# Patient Record
Sex: Female | Born: 1955
Health system: Southern US, Community
[De-identification: ages and names within clinical notes are randomized; demographics above are authoritative.]

## PROBLEM LIST (undated history)

## (undated) DIAGNOSIS — H9190 Unspecified hearing loss, unspecified ear: Secondary | ICD-10-CM

## (undated) DIAGNOSIS — K219 Gastro-esophageal reflux disease without esophagitis: Secondary | ICD-10-CM

## (undated) DIAGNOSIS — K760 Fatty (change of) liver, not elsewhere classified: Secondary | ICD-10-CM

## (undated) DIAGNOSIS — C787 Secondary malignant neoplasm of liver and intrahepatic bile duct: Secondary | ICD-10-CM

## (undated) DIAGNOSIS — N390 Urinary tract infection, site not specified: Secondary | ICD-10-CM

## (undated) DIAGNOSIS — C259 Malignant neoplasm of pancreas, unspecified: Secondary | ICD-10-CM

## (undated) DIAGNOSIS — T7840XA Allergy, unspecified, initial encounter: Secondary | ICD-10-CM

## (undated) DIAGNOSIS — F32A Depression, unspecified: Secondary | ICD-10-CM

## (undated) DIAGNOSIS — F329 Major depressive disorder, single episode, unspecified: Secondary | ICD-10-CM

## (undated) DIAGNOSIS — E119 Type 2 diabetes mellitus without complications: Secondary | ICD-10-CM

## (undated) DIAGNOSIS — C801 Malignant (primary) neoplasm, unspecified: Secondary | ICD-10-CM

## (undated) DIAGNOSIS — G43909 Migraine, unspecified, not intractable, without status migrainosus: Secondary | ICD-10-CM

## (undated) DIAGNOSIS — J189 Pneumonia, unspecified organism: Secondary | ICD-10-CM

## (undated) DIAGNOSIS — I7300 Raynaud's syndrome without gangrene: Secondary | ICD-10-CM

## (undated) DIAGNOSIS — R748 Abnormal levels of other serum enzymes: Secondary | ICD-10-CM

## (undated) DIAGNOSIS — G4730 Sleep apnea, unspecified: Secondary | ICD-10-CM

## (undated) DIAGNOSIS — M7711 Lateral epicondylitis, right elbow: Secondary | ICD-10-CM

## (undated) DIAGNOSIS — E785 Hyperlipidemia, unspecified: Secondary | ICD-10-CM

## (undated) DIAGNOSIS — N189 Chronic kidney disease, unspecified: Secondary | ICD-10-CM

## (undated) DIAGNOSIS — F99 Mental disorder, not otherwise specified: Secondary | ICD-10-CM

## (undated) DIAGNOSIS — F419 Anxiety disorder, unspecified: Secondary | ICD-10-CM

## (undated) DIAGNOSIS — I10 Essential (primary) hypertension: Secondary | ICD-10-CM

## (undated) DIAGNOSIS — M1990 Unspecified osteoarthritis, unspecified site: Secondary | ICD-10-CM

## (undated) DIAGNOSIS — G5711 Meralgia paresthetica, right lower limb: Secondary | ICD-10-CM

## (undated) DIAGNOSIS — Z87440 Personal history of urinary (tract) infections: Secondary | ICD-10-CM

## (undated) DIAGNOSIS — Z8489 Family history of other specified conditions: Secondary | ICD-10-CM

## (undated) DIAGNOSIS — A419 Sepsis, unspecified organism: Secondary | ICD-10-CM

## (undated) DIAGNOSIS — K8020 Calculus of gallbladder without cholecystitis without obstruction: Secondary | ICD-10-CM

## (undated) DIAGNOSIS — G4733 Obstructive sleep apnea (adult) (pediatric): Secondary | ICD-10-CM

## (undated) DIAGNOSIS — E669 Obesity, unspecified: Secondary | ICD-10-CM

## (undated) HISTORY — DX: Unspecified osteoarthritis, unspecified site: M19.90

## (undated) HISTORY — DX: Fatty (change of) liver, not elsewhere classified: K76.0

## (undated) HISTORY — PX: COLONOSCOPY: SHX174

## (undated) HISTORY — DX: Essential (primary) hypertension: I10

## (undated) HISTORY — DX: Allergy, unspecified, initial encounter: T78.40XA

## (undated) HISTORY — DX: Obstructive sleep apnea (adult) (pediatric): G47.33

## (undated) HISTORY — DX: Meralgia paresthetica, right lower limb: G57.11

## (undated) HISTORY — DX: Depression, unspecified: F32.A

## (undated) HISTORY — DX: Lateral epicondylitis, right elbow: M77.11

## (undated) HISTORY — PX: OTHER SURGICAL HISTORY: SHX169

## (undated) HISTORY — DX: Hyperlipidemia, unspecified: E78.5

## (undated) HISTORY — DX: Migraine, unspecified, not intractable, without status migrainosus: G43.909

## (undated) HISTORY — PX: JOINT REPLACEMENT: SHX530

## (undated) HISTORY — DX: Major depressive disorder, single episode, unspecified: F32.9

## (undated) HISTORY — DX: Personal history of urinary (tract) infections: Z87.440

## (undated) HISTORY — PX: CARDIAC CATHETERIZATION: SHX172

## (undated) HISTORY — DX: Calculus of gallbladder without cholecystitis without obstruction: K80.20

## (undated) HISTORY — DX: Obesity, unspecified: E66.9

## (undated) HISTORY — DX: Sleep apnea, unspecified: G47.30

## (undated) HISTORY — DX: Urinary tract infection, site not specified: N39.0

---

## 1997-07-06 ENCOUNTER — Other Ambulatory Visit: Admission: RE | Admit: 1997-07-06 | Discharge: 1997-07-06 | Payer: Self-pay | Admitting: *Deleted

## 1997-07-17 ENCOUNTER — Ambulatory Visit (HOSPITAL_COMMUNITY): Admission: RE | Admit: 1997-07-17 | Discharge: 1997-07-17 | Payer: Self-pay | Admitting: *Deleted

## 1998-08-10 ENCOUNTER — Other Ambulatory Visit: Admission: RE | Admit: 1998-08-10 | Discharge: 1998-08-10 | Payer: Self-pay | Admitting: *Deleted

## 1998-08-18 ENCOUNTER — Encounter: Payer: Self-pay | Admitting: *Deleted

## 1998-08-18 ENCOUNTER — Ambulatory Visit (HOSPITAL_COMMUNITY): Admission: RE | Admit: 1998-08-18 | Discharge: 1998-08-18 | Payer: Self-pay | Admitting: *Deleted

## 1998-08-23 ENCOUNTER — Ambulatory Visit (HOSPITAL_COMMUNITY): Admission: RE | Admit: 1998-08-23 | Discharge: 1998-08-23 | Payer: Self-pay | Admitting: *Deleted

## 1998-08-23 ENCOUNTER — Encounter: Payer: Self-pay | Admitting: *Deleted

## 1999-07-15 ENCOUNTER — Other Ambulatory Visit: Admission: RE | Admit: 1999-07-15 | Discharge: 1999-07-15 | Payer: Self-pay | Admitting: *Deleted

## 1999-08-31 ENCOUNTER — Encounter: Payer: Self-pay | Admitting: *Deleted

## 1999-08-31 ENCOUNTER — Encounter: Admission: RE | Admit: 1999-08-31 | Discharge: 1999-08-31 | Payer: Self-pay | Admitting: *Deleted

## 2000-08-23 ENCOUNTER — Other Ambulatory Visit: Admission: RE | Admit: 2000-08-23 | Discharge: 2000-08-23 | Payer: Self-pay | Admitting: *Deleted

## 2000-08-24 ENCOUNTER — Encounter (INDEPENDENT_AMBULATORY_CARE_PROVIDER_SITE_OTHER): Payer: Self-pay

## 2000-08-24 ENCOUNTER — Other Ambulatory Visit: Admission: RE | Admit: 2000-08-24 | Discharge: 2000-08-24 | Payer: Self-pay | Admitting: *Deleted

## 2000-09-20 ENCOUNTER — Encounter: Admission: RE | Admit: 2000-09-20 | Discharge: 2000-09-20 | Payer: Self-pay | Admitting: *Deleted

## 2000-09-20 ENCOUNTER — Encounter: Payer: Self-pay | Admitting: *Deleted

## 2000-12-27 ENCOUNTER — Encounter (INDEPENDENT_AMBULATORY_CARE_PROVIDER_SITE_OTHER): Payer: Self-pay | Admitting: Specialist

## 2000-12-27 ENCOUNTER — Emergency Department (HOSPITAL_COMMUNITY): Admission: EM | Admit: 2000-12-27 | Discharge: 2000-12-27 | Payer: Self-pay | Admitting: Emergency Medicine

## 2000-12-27 ENCOUNTER — Ambulatory Visit (HOSPITAL_COMMUNITY): Admission: RE | Admit: 2000-12-27 | Discharge: 2000-12-27 | Payer: Self-pay | Admitting: General Surgery

## 2001-09-23 ENCOUNTER — Encounter: Admission: RE | Admit: 2001-09-23 | Discharge: 2001-09-23 | Payer: Self-pay | Admitting: *Deleted

## 2001-09-23 ENCOUNTER — Encounter: Payer: Self-pay | Admitting: *Deleted

## 2001-12-05 ENCOUNTER — Ambulatory Visit (HOSPITAL_COMMUNITY): Admission: RE | Admit: 2001-12-05 | Discharge: 2001-12-05 | Payer: Self-pay | Admitting: *Deleted

## 2001-12-11 ENCOUNTER — Encounter: Payer: Self-pay | Admitting: Internal Medicine

## 2001-12-11 ENCOUNTER — Encounter: Admission: RE | Admit: 2001-12-11 | Discharge: 2001-12-11 | Payer: Self-pay | Admitting: Internal Medicine

## 2002-01-31 ENCOUNTER — Encounter: Payer: Self-pay | Admitting: Obstetrics & Gynecology

## 2002-01-31 ENCOUNTER — Encounter: Admission: RE | Admit: 2002-01-31 | Discharge: 2002-01-31 | Payer: Self-pay | Admitting: Obstetrics & Gynecology

## 2002-03-27 HISTORY — PX: ABDOMINAL HYSTERECTOMY: SHX81

## 2002-04-15 ENCOUNTER — Inpatient Hospital Stay (HOSPITAL_COMMUNITY): Admission: RE | Admit: 2002-04-15 | Discharge: 2002-04-18 | Payer: Self-pay | Admitting: Obstetrics & Gynecology

## 2002-04-15 ENCOUNTER — Encounter (INDEPENDENT_AMBULATORY_CARE_PROVIDER_SITE_OTHER): Payer: Self-pay | Admitting: Specialist

## 2002-04-25 ENCOUNTER — Emergency Department (HOSPITAL_COMMUNITY): Admission: EM | Admit: 2002-04-25 | Discharge: 2002-04-25 | Payer: Self-pay | Admitting: Emergency Medicine

## 2002-09-25 HISTORY — PX: OTHER SURGICAL HISTORY: SHX169

## 2002-11-06 ENCOUNTER — Encounter: Admission: RE | Admit: 2002-11-06 | Discharge: 2002-11-06 | Payer: Self-pay | Admitting: Obstetrics & Gynecology

## 2002-11-06 ENCOUNTER — Encounter: Payer: Self-pay | Admitting: Obstetrics & Gynecology

## 2002-12-04 ENCOUNTER — Encounter: Payer: Self-pay | Admitting: Internal Medicine

## 2002-12-04 ENCOUNTER — Encounter: Admission: RE | Admit: 2002-12-04 | Discharge: 2002-12-04 | Payer: Self-pay | Admitting: Internal Medicine

## 2003-10-07 ENCOUNTER — Ambulatory Visit (HOSPITAL_BASED_OUTPATIENT_CLINIC_OR_DEPARTMENT_OTHER): Admission: RE | Admit: 2003-10-07 | Discharge: 2003-10-07 | Payer: Self-pay | Admitting: Internal Medicine

## 2003-11-10 ENCOUNTER — Encounter: Admission: RE | Admit: 2003-11-10 | Discharge: 2003-11-10 | Payer: Self-pay | Admitting: Obstetrics & Gynecology

## 2004-08-26 ENCOUNTER — Ambulatory Visit (HOSPITAL_COMMUNITY): Admission: RE | Admit: 2004-08-26 | Discharge: 2004-08-26 | Payer: Self-pay | Admitting: Internal Medicine

## 2004-12-06 ENCOUNTER — Encounter: Admission: RE | Admit: 2004-12-06 | Discharge: 2004-12-06 | Payer: Self-pay | Admitting: Obstetrics & Gynecology

## 2005-02-24 HISTORY — PX: KNEE ARTHROSCOPY: SUR90

## 2005-12-19 ENCOUNTER — Encounter: Admission: RE | Admit: 2005-12-19 | Discharge: 2005-12-19 | Payer: Self-pay | Admitting: Obstetrics & Gynecology

## 2006-01-03 ENCOUNTER — Encounter: Admission: RE | Admit: 2006-01-03 | Discharge: 2006-01-03 | Payer: Self-pay | Admitting: Internal Medicine

## 2006-05-10 ENCOUNTER — Encounter: Admission: RE | Admit: 2006-05-10 | Discharge: 2006-05-10 | Payer: Self-pay | Admitting: *Deleted

## 2006-07-19 ENCOUNTER — Encounter: Admission: RE | Admit: 2006-07-19 | Discharge: 2006-07-19 | Payer: Self-pay | Admitting: Internal Medicine

## 2006-12-24 ENCOUNTER — Encounter: Admission: RE | Admit: 2006-12-24 | Discharge: 2006-12-24 | Payer: Self-pay | Admitting: Obstetrics & Gynecology

## 2007-01-16 ENCOUNTER — Encounter: Admission: RE | Admit: 2007-01-16 | Discharge: 2007-01-16 | Payer: Self-pay | Admitting: Internal Medicine

## 2007-12-18 ENCOUNTER — Encounter: Admission: RE | Admit: 2007-12-18 | Discharge: 2007-12-18 | Payer: Self-pay | Admitting: Internal Medicine

## 2008-01-17 ENCOUNTER — Encounter: Admission: RE | Admit: 2008-01-17 | Discharge: 2008-01-17 | Payer: Self-pay | Admitting: Obstetrics & Gynecology

## 2008-05-13 ENCOUNTER — Other Ambulatory Visit: Admission: RE | Admit: 2008-05-13 | Discharge: 2008-05-13 | Payer: Self-pay | Admitting: *Deleted

## 2008-07-30 ENCOUNTER — Ambulatory Visit (HOSPITAL_COMMUNITY): Admission: RE | Admit: 2008-07-30 | Discharge: 2008-07-30 | Payer: Self-pay | Admitting: Interventional Radiology

## 2008-08-25 HISTORY — PX: BLADDER SUSPENSION: SHX72

## 2008-10-23 ENCOUNTER — Ambulatory Visit (HOSPITAL_BASED_OUTPATIENT_CLINIC_OR_DEPARTMENT_OTHER): Admission: RE | Admit: 2008-10-23 | Discharge: 2008-10-23 | Payer: Self-pay | Admitting: Urology

## 2009-01-18 ENCOUNTER — Encounter: Admission: RE | Admit: 2009-01-18 | Discharge: 2009-01-18 | Payer: Self-pay | Admitting: Obstetrics & Gynecology

## 2009-02-24 HISTORY — PX: PLANTAR FASCIA RELEASE: SHX2239

## 2009-06-07 ENCOUNTER — Encounter: Admission: RE | Admit: 2009-06-07 | Discharge: 2009-06-07 | Payer: Self-pay | Admitting: Orthopaedic Surgery

## 2009-06-07 ENCOUNTER — Encounter: Admission: RE | Admit: 2009-06-07 | Discharge: 2009-06-07 | Payer: Self-pay | Admitting: Internal Medicine

## 2009-08-25 HISTORY — PX: ROTATOR CUFF REPAIR: SHX139

## 2010-01-21 ENCOUNTER — Encounter: Admission: RE | Admit: 2010-01-21 | Discharge: 2010-01-21 | Payer: Self-pay | Admitting: Internal Medicine

## 2010-02-04 ENCOUNTER — Encounter: Admission: RE | Admit: 2010-02-04 | Discharge: 2010-02-04 | Payer: Self-pay | Admitting: Obstetrics & Gynecology

## 2010-02-16 ENCOUNTER — Encounter: Admission: RE | Admit: 2010-02-16 | Discharge: 2010-02-16 | Payer: Self-pay | Admitting: Obstetrics & Gynecology

## 2010-03-22 ENCOUNTER — Encounter
Admission: RE | Admit: 2010-03-22 | Discharge: 2010-03-22 | Payer: Self-pay | Source: Home / Self Care | Attending: Internal Medicine | Admitting: Internal Medicine

## 2010-04-16 ENCOUNTER — Encounter: Payer: Self-pay | Admitting: Obstetrics & Gynecology

## 2010-04-17 ENCOUNTER — Encounter: Payer: Self-pay | Admitting: Internal Medicine

## 2010-05-04 ENCOUNTER — Other Ambulatory Visit: Payer: Self-pay | Admitting: Orthopaedic Surgery

## 2010-05-04 DIAGNOSIS — M79601 Pain in right arm: Secondary | ICD-10-CM

## 2010-05-08 ENCOUNTER — Ambulatory Visit
Admission: RE | Admit: 2010-05-08 | Discharge: 2010-05-08 | Disposition: A | Discharge status: Home / Self Care | Payer: BC Managed Care – PPO | Source: Ambulatory Visit | Attending: Orthopaedic Surgery | Admitting: Orthopaedic Surgery

## 2010-05-08 DIAGNOSIS — M79601 Pain in right arm: Secondary | ICD-10-CM

## 2010-05-09 ENCOUNTER — Other Ambulatory Visit: Payer: Self-pay

## 2010-05-16 ENCOUNTER — Other Ambulatory Visit: Payer: Self-pay | Admitting: Orthopaedic Surgery

## 2010-05-16 DIAGNOSIS — M25531 Pain in right wrist: Secondary | ICD-10-CM

## 2010-05-24 ENCOUNTER — Ambulatory Visit
Admission: RE | Admit: 2010-05-24 | Discharge: 2010-05-24 | Disposition: A | Discharge status: Home / Self Care | Payer: BC Managed Care – PPO | Source: Ambulatory Visit | Attending: Orthopaedic Surgery | Admitting: Orthopaedic Surgery

## 2010-05-24 DIAGNOSIS — M25531 Pain in right wrist: Secondary | ICD-10-CM

## 2010-06-26 HISTORY — PX: OTHER SURGICAL HISTORY: SHX169

## 2010-07-03 LAB — POCT HEMOGLOBIN-HEMACUE: Hemoglobin: 12.7 g/dL (ref 12.0–15.0)

## 2010-08-09 NOTE — Op Note (Signed)
NAMEVEENA, STURGESS                ACCOUNT NO.:  [id number]   MEDICAL RECORD NO.:  [id number]          PATIENT TYPE:  AMB   LOCATION:  [facility]                         FACILITY:  [facility]   PHYSICIAN:  Sigmund I. Patsi Sears, M.D.DATE OF BIRTH:  12-25-1955   DATE OF PROCEDURE:  10/23/2008  DATE OF DISCHARGE:                               OPERATIVE REPORT   PREOPERATIVE DIAGNOSIS:  Stress urinary incontinence.   POSTOPERATIVE DIAGNOSIS:  Stress urinary incontinence.   OPERATION:  Solyx urethral single incision sling, cystoscopy.   SURGEON:  S. Patsi Sears, M.D.   ASSISTANT:  Guy Sandifer, N.P.-C.   ANESTHESIA:  General LMA.   PREPARATION:  After appropriate preanesthesia, the patient was brought  to the operating room and placed on the operating room in dorsal supine  position where general LMA anesthesia was induced.  B&O suppository was  given.  The pubis was prepped with Betadine solution and draped in usual  fashion.   REVIEW OF HISTORY:  This 55 year old female complains of stress urinary  continence, with urodynamics showing a Valsalva leak point pressure of  66 cm of water.  She is now for a Solyx single incision sling.   PROCEDURE:  Posterior weighted speculum was placed in the vagina, and  there was no evidence of cystocele, enterocele, or rectocele.  The  patient does have normal pelvic exam and no pelvic masses noted.   Foley catheter was placed, and clear urine was obtained.  Marcaine 0.5%  with epinephrine 1:200,000 was then injected in the marked mid urethral  site, and hydrodistention was accomplished bilaterally.  A 1.5-cm  incision was then made in the mid urethra, subcutaneous tissue dissected  bilaterally.  Using the Solyx sling kit, the sling was placed in the  obturator internus muscle bilaterally.  Tensioning was evaluated and  felt to be normal.  Irrigation was accomplished, and classic pillowing  was noted.  The wound was then closed in a single layer with running  2-0  Vicryl suture.  Cystoscopy was accomplished and showed normal urethral  anatomy, although there was edema of the bladder neck.  The bladder  itself is normal, with clear reflux of both orifices, set on a normal  trigone.  The patient tolerated the procedure well.  She was awakened  after given IV Toradol, taken to recovery room in good condition.      Sigmund I. Patsi Sears, M.D.  Electronically Signed    SIT/MEDQ  D:  10/23/2008  T:  10/23/2008  Job:  [id number]

## 2010-08-12 NOTE — Discharge Summary (Signed)
NAME:  Heather Frederick, Heather Frederick                          ACCOUNT NO.:  [id number]   MEDICAL RECORD NO.:  [id number]                   PATIENT TYPE:  INP   LOCATION:  9310                                 FACILITY:  [facility]   PHYSICIAN:  Ilda Mori, M.D.                DATE OF BIRTH:  1955-07-17   DATE OF ADMISSION:  04/15/2002  DATE OF DISCHARGE:  04/18/2002                                 DISCHARGE SUMMARY   FINAL DIAGNOSES:  1. Menometrorrhagia.  2. Urinary incontinence.  3. Uterine and pelvic relaxation.   SECONDARY DIAGNOSES:  None.   PROCEDURE:  1. Transvaginal hysterectomy.  2. Anterior and posterior repair.   COMPLICATIONS:  Postoperative urinary retention.   CONDITION ON DISCHARGE:  Improved.   HISTORY OF PRESENT ILLNESS:  This is a 55 year old gravida 2, para 2 who is  admitted to the hospital for treatment of menometrorrhagia that did not  respond to oral contraceptives and for pelvic relaxation with urinary  incontinence.  The patient was evaluated preoperatively with urodynamics  which showed basically normal studies.  However, the patient does have a  history of urinary loss and did have a cystocele and rectocele.   HOSPITAL COURSE:  The patient was taken to the operating room on the day of  admission where a transvaginal hysterectomy and anterior and posterior  repair were performed.  The patient's postoperative course was marked with  significant lower abdominal pain and urinary retention after the catheter  was removed on day two.  Postvoid residuals were 500 and 600 mL.  On the  morning of postoperative day three her pain was controlled with oral pain  medications and decision was made to send her home with an indwelling Foley  catheter.  She was therefore felt ready to be discharged.  She was  discharged on a regular diet.  Told to limit her activity.  She was given  Tylox for pain.  She was given Macrobid, antibiotics to cover her Foley and  she was sent home with  a Foley in place.  She was asked to remove the Foley  on her own in three days and to call the office for poor voiding.  She was  also asked to return to the office in two weeks for routine follow-up visit.   LABORATORY DATA:  Admission hemoglobin of 14.7, white count 7100.  Postoperatively hemoglobin was 11.3 with a white count of 9200.  Routine  chemistries were all within normal limits.  Urinalysis was benign.  Her  pathology is pending at the time of this dictation.                                               Ilda Mori, M.D.    RK/MEDQ  D:  04/18/2002  T:  04/19/2002  Job:  [id number]

## 2010-08-12 NOTE — Op Note (Signed)
[facility]  Patient:    Heather Frederick, Heather Frederick Visit Number: [id number] MRN: [id number]          Service Type: DSU Location: DAY Attending Physician:  Brandy Hale Proc. Date: 12/27/00 Admit Date:  12/27/2000   CC:         Fayrene Fearing C. Earl Gala, M.D.   Operative Report  PREOPERATIVE DIAGNOSIS:  Internal and external hemorrhoids.  POSTOPERATIVE DIAGNOSIS:  Internal and external hemorrhoids.  OPERATION PERFORMED:  A rigid proctoscopy, internal and external hemorrhoidectomy.  SURGEON:  Angelia Mould. Derrell Lolling, M.D.  OPERATIVE INDICATIONS:  This is a 55 year old white female, who has had problems with hemorrhoids for at least 18 years.  Her biggest complaint is a large, protruding hemorrhoid anteriorly.  She gets flare-ups from time to time which have been getting worse, a little bit of bleeding, and a lot of pain. On exam, she has a large internal and external hemorrhoid complex anteriorly and slightly to the right and then smaller hemorrhoids elsewhere.  No other abnormalities are noted.  She is frustrated with managing her hemorrhoids and after discussing all options, she decided that she wanted to have these areas excised.  OPERATIVE TECHNIQUE:  Following the induction of general endotracheal anesthesia, the patient was placed in the dorsal lithotomy position.  The perianal area was prepped and draped in a sterile fashion.  Rigid proctoscopy was carried out, but I could only go to about 10-12 cm because the prep was poor.  Nevertheless, the distal rectal mucosa looked normal.  We then gently dilated the anal canal which was not very difficult.  Anoscope was inserted, and we inspected and found a large internal and external hemorrhoid complex just to the right of the midline anteriorly.  She also had moderate internal and external hemorrhoids, right posterior and left lateral. I chose to excise all three areas in hopes of resolving her problems.  All  of the hemorrhoid excisions were done in essentially the same manner, and so they will be dictated as a single technique, but I did excise all three areas.  Marcaine .05% with epinephrine was injected into the submucosal area and the perianal anoderm skin.  Hemorrhoid in the right anterior position was exposed, and hemostats were placed for traction.  A figure-of-eight suture of 2-0 chromic was placed above the hemorrhoidal pile.  Using electrocautery and sharp scissor dissection, we excised all of the hemorrhoidal tissue internally and externally, all the way down to the internal sphincter muscle but leaving the internal sphincter muscle intact.  Hemostasis was adequate and using electrocautery, we closed the rectal mucosa with running locking suture of 2-0 chromic.  We were very careful to approximate the dentate line.  The anoderm skin was closed carefully with continuation of a running simple suture of 2-0 chromic.  This technique was essentially used right anterior, right posterior, and left lateral until all three suture lines were completed.  There was no bleeding at the end of the case.  We observed this for 3-4 minutes, and there was no bleeding whatsoever.  We were able to insert the large anoscope after completion of the case.  There was no stenosis.  We had an excellent mucosal bridge between all three suture lines.  External bandage was placed.  The patient was taken to the recovery room in stable condition.  Estimated blood loss was about 20 cc.  Complications none.  Sponge and instrument counts were correct. Attending Physician:  Brandy Hale DD:  12/27/00 TD:  12/27/00 Job: [id number]

## 2010-08-12 NOTE — Op Note (Signed)
NAME:  Heather Frederick, Heather Frederick                          ACCOUNT NO.:  [id number]   MEDICAL RECORD NO.:  [id number]                   PATIENT TYPE:  INP   LOCATION:  9310                                 FACILITY:  [facility]   PHYSICIAN:  Ilda Mori, M.D.                DATE OF BIRTH:  1955-10-20   DATE OF PROCEDURE:  04/15/2002  DATE OF DISCHARGE:                                 OPERATIVE REPORT   PREOPERATIVE DIAGNOSES:  1. Menometrorrhagia.  2. Genuine urinary stress incontinence.   POSTOPERATIVE DIAGNOSES:  1. Menometrorrhagia.  2. Genuine urinary stress incontinence.   PROCEDURE:  Transvaginal hysterectomy, anterior and posterior repair.   SURGEON:  Ilda Mori, M.D.   ASSISTANT:  Randye Lobo, M.D.   ANESTHESIA:  General endotracheal.   ESTIMATED BLOOD LOSS:  500 cc.   FINDINGS:  Normal appearing uterus, 2+ cystocele, 2+ rectocele.   INDICATIONS FOR PROCEDURE:  This is a 55 year old gravida 2, para 2, who has  been having irregular and heavy menstrual periods.  These did not respond to  oral contraceptives and the patient requested surgical correction.  In  addition, the patient complained of urinary leakage.  Urodynamics showed  mild urinary incontinence but no definite genuine urinary stress  incontinence but and it was felt that anterior repair would be sufficient to  correct the problem.   DESCRIPTION OF PROCEDURE:  The patient was taken to the operating room and  placed in the dorsal lithotomy position after general endotracheal was  induced.  The abdomen and perineum and vagina were prepped and draped in  sterile fashion.  The cervix was grasped with a Christella Hartigan tenaculum and the  paracervical tissues were infiltrated with a dilute epinephrine and Marcaine  solution.  The vagina around the cervix was incised and the anterior and  posterior cul-de-sac were identified and entered sharply.  The uterosacral  ligaments were clamped, cut, and doubly ligated.  The cardinal  ligaments and  uterine vessels were grasped with LigaSure clamp, cauterized, and cut.  The  uterus was delivered posteriorly.  The utero-ovarian anastomosis and the  base of the broad ligament was cauterized with the LigaSure clamp and cut as  so that the uterus was removed.  The posterior cuff was then closed with a  running interlocking 1 Vicryl suture.  A small enterocele was noted and this  was repaired with a McCall's culdoplasty.  This suture was placed but not  tied at that time.  The anterior vaginal wall was then grasped with Allis  clamps and dissected from the bladder and the cystocele was isolated and  corrected with 1 Vicryl sutures in a Kelly plication fashion.  The  uterovesical junction was identified and special care was made to alienate  the uterovesical junction with the Kelly plication sutures.  The anterior  wall of the vagina was then closed with a running 0 Vicryl suture.  Attention  was then turned to the rectocele.  A small V-shaped incision was  made over the perineal body and the posterior vagina was then dissected free  from the rectocele.  The rectocele was corrected with the surgeon's finger  in the rectum pulling with 0 Vicryl suture so the defect was corrected.  The  excessive vaginal tissues were excised and the posterior vagina was closed  with a running 0 Vicryl suture and the perineal body was reapposed with  subcuticular 3-0 Vicryl suture.  At this point, the McCall's enterocele  suture was tied down which elevated the cul-de-sac.  The procedure was then  terminated.  The bladder had previously been catheterized with a Foley  catheter and this was left in place.  The vagina was packed with Vaseline  gauze.  The patient tolerated the procedure well and left the operating room  in good condition.                                               Ilda Mori, M.D.    RK/MEDQ  D:  04/15/2002  T:  04/15/2002  Job:  [id number]

## 2010-08-12 NOTE — H&P (Signed)
NAME:  Heather Frederick, Heather Frederick                          ACCOUNT NO.:  [id number]   MEDICAL RECORD NO.:  [id number]                   PATIENT TYPE:  INP   LOCATION:  NA                                   FACILITY:  [facility]   PHYSICIAN:  Ilda Mori, M.D.                DATE OF BIRTH:  1955/11/27   DATE OF ADMISSION:  DATE OF DISCHARGE:                                HISTORY & PHYSICAL   CHIEF COMPLAINT:  Pelvic relaxation and dysfunctional uterine bleeding.   HISTORY OF PRESENT ILLNESS:  This is a 55 year old Gravida II, Para II, who  has noted for the past year, irregular menstrual periods. In addition, over  the past several months, the patient has complained of difficulty holding  tampons in the vagina and mild urinary stress incontinence. She was treated  with oral contraceptives and this did not significantly improve her abnormal  bleeding. She was evaluated by Dr. Conley Simmonds with Urodynamics, to evaluate  her urinary incontinence and it was felt after evaluation by her that she  had a second degree cystocele, first degree uterine prolapse, second degree  rectocele with no evidence at Urodynamics of severe, genuine urinary stress  incontinence.   PAST MEDICAL HISTORY:  Reveals that she has history of migraine headaches,  gestational diabetes mellitus.   PAST SURGICAL HISTORY:  External hemorrhoidectomy.   ALLERGIES:  SULFA.   MEDICATIONS:  Include Zoloft 100 mg daily and Merchet oral contraceptives.   FAMILY HISTORY:  Positive history of heart disease, diabetes mellitus, and  hypertension. There is no breast, ovarian, or colon cancer in the family.   SOCIAL HISTORY:  The patient works as a Glass blower/designer. She denies alcohol, tobacco, or drug use.   REVIEW OF SYSTEMS:  Positive for hot flashes, leaky urine, headaches, and  anxiety.   PHYSICAL EXAMINATION:  VITAL SIGNS: 5'6 and 218 pounds. Blood pressure  120/80.  HEENT: Normal.  BREAST: Without  masses.  HEART: Regular rate and rhythm.  No murmur, rub, or gallop.  LUNGS: Clear.  ABDOMEN: Without hepatosplenomegaly. No lymphadenopathy.  GU: External genitalia show 2+ cysto-rectocele with 1+ cervical uterine  prolapse. There are no adnexal masses.    RECOMMENDATIONS:  The options were discussed with the patient and in view of  the failure of her oral contraceptives to adequately control her periods and  in view of her symptoms of pelvic prolapse, the decision was made to proceed  with total vaginal hysterectomy with anterior and posterior repair. The  risks of bladder, ureteral injury, infection, hemorrhage, and pulmonary  embolus was discussed with the patient prior to proceeding.                                               Ilda Mori,  M.D.    RK/MEDQ  D:  04/14/2002  T:  04/14/2002  Job:  [id number]   cc:   Randye Lobo, M.D.  [address]  [place]  [place]  [address]  Fax: [phone]

## 2010-08-12 NOTE — Procedures (Signed)
NAME:  Heather Frederick, Heather Frederick              ACCOUNT NO.:  [id number]   MEDICAL RECORD NO.:  [id number]          PATIENT TYPE:  OUT   LOCATION:  SLEEP CENTER                 [facility]ACILITY:  [facility]   PHYSICIAN:  Clinton D. Maple Hudson, M.D. DATE OF BIRTH:  1955-10-17   DATE OF ADMISSION:  10/07/2003  DATE OF DISCHARGE:  10/07/2003                              NOCTURNAL POLYSOMNOGRAM   REFERRING PHYSICIAN:  Dr. Theressa Millard   INDICATION FOR STUDY AND HISTORY:  Hypersomnia with sleep apnea, complaints  of waking fatigue, excessive daytime sleepiness.   Epworth sleepiness score 14/24, BMI 41.7, weight 260 pounds.   Home medications were not reported, but no medications at bedtime is  indicated.  MPS3 protocol was requested.   SLEEP ARCHITECTURE:  Sleep time was 386 minutes, adequate for evaluation  with a sleep efficiency of 75%.  Total sleep time 12% with stage I, 65%  stage II, 19% stages III and IV. 4%  REM.  Bathroom trips x 2.   RESPIRATORY DATA:  Moderate obstructive sleep apnea/hypopnea syndrome (RDI  29 obstructive event per hour).  There were 146 obstructive hypopnea, 41  obstructive apneas.  Events were not sensitive to sleep position or to REM.   OXYGEN DATA:  Mild snoring with normal oxygenation.  Oxygen saturation held  90 to 95% throughout the study.   CARDIAC DATA:  Normal sinus rhythm with no significant ectopic.   MOVEMENT AND PARASOMNIA:  There were 29 body jerks recorded of which 6 were  associated with mild arousal for a periodic limb movement with arousal index  of 0.9 times per hour which is not clinically significant.   IMPRESSION AND RECOMMENDATIONS:  Moderate obstructive sleep apnea/hypopnea  (RDI 29 per hour).  Consider return for CPAP titration if appropriate.                                   ______________________________                                Rennis Chris. Maple Hudson, M.D.                                Diplomate, American Board of Sleep Medicine    CDY/MEDQ  D:   10/11/2003 17:50:35  T:  10/12/2003 17:36:57  Job:  [id number]

## 2010-08-12 NOTE — Cardiovascular Report (Signed)
NAME:  Heather Frederick, Heather Frederick                          ACCOUNT NO.:  [id number]   MEDICAL RECORD NO.:  [id number]                   PATIENT TYPE:  OIB   LOCATION:  2854                                 FACILITY:  [facility]   PHYSICIAN:  Helen A. Fraser Din, M.D.              DATE OF BIRTH:  1955-12-06   DATE OF PROCEDURE:  DATE OF DISCHARGE:                              CARDIAC CATHETERIZATION   INDICATIONS FOR PROCEDURE:  A small region of reversibility in the anterior  wall with ongoing chest pain.   DESCRIPTION OF PROCEDURE:  After obtaining written informed consent, the  patient was brought to the cardiac catheterization laboratory in the  postabsorptive state. Preoperative sedation was achieved using IV Versed.  A  total of 3 mg of IV Versed was given. The right groin was prepped and draped  in the usual sterile fashion. Local anesthesia was achieved using 1%  Xylocaine. A 6 French hemostasis sheath was placed into the right femoral  artery using the modified Seldinger technique. Selective coronary  angiography was performed using a JL4 and a modified JR4 right. Single plane  ventriculogram was performed in the RAO position. Multiple views were  obtained. The hemostasis sheath was flushed following each catheter  exchange.  Following the procedure, there was no identifiable disease. The  patient was transferred to the holding area and the hemostasis sheath was  removed. Hemostasis was achieved using digital pressure.   FINDINGS:  There was no gradient noted on pullback. Single plane  ventriculogram revealed normal wall motion, ejection fraction of  approximately 65%.  There was apical hypertrophy which may account for the  redistribution noted on Cardiolite.   CORONARY ANGIOGRAPHY:  Left main coronary artery:  The left main coronary artery is short. It  bifurcates into the left anterior descending and circumflex vessel. There is  no disease in the left main coronary artery or its  branches.   Left anterior descending:  The left anterior descending gives rise to a  large bifurcating diagonal #1, goes on to end as an apical branch.  There is  no disease in the left anterior descending or its branches.   Circumflex vessel:  The circumflex vessel gives rise to a small OM-1, large  OM-2, goes on to end as a large OM-3 branch.  There is no disease in the  circumflex or its branches.   Right coronary artery:  The right coronary artery is a large dominant artery  giving rise to two RV marginals, PA and PL branch. There is no disease in  the right coronary artery or its branches.    FINAL IMPRESSION:  False-positive Cardiolite. Other etiologies for chest  pain should be considered. The patient will be enrolled in a cardiac  rehabilitation program to increase her exercise tolerance.  Helen A. Fraser Din, M.D.    HAP/MEDQ  D:  12/05/2001  T:  12/06/2001  Job:  [id number]   cc:   Winn Jock. Earl Gala, M.D.

## 2010-12-16 ENCOUNTER — Other Ambulatory Visit: Payer: Self-pay | Admitting: Orthopaedic Surgery

## 2010-12-16 DIAGNOSIS — M25562 Pain in left knee: Secondary | ICD-10-CM

## 2010-12-23 ENCOUNTER — Ambulatory Visit
Admission: RE | Admit: 2010-12-23 | Discharge: 2010-12-23 | Disposition: A | Discharge status: Home / Self Care | Payer: BC Managed Care – PPO | Source: Ambulatory Visit | Attending: Orthopaedic Surgery | Admitting: Orthopaedic Surgery

## 2010-12-23 ENCOUNTER — Other Ambulatory Visit: Payer: BC Managed Care – PPO

## 2010-12-23 DIAGNOSIS — M25562 Pain in left knee: Secondary | ICD-10-CM

## 2011-01-29 ENCOUNTER — Emergency Department (HOSPITAL_COMMUNITY)
Admission: EM | Admit: 2011-01-29 | Discharge: 2011-01-29 | Disposition: A | Discharge status: Home / Self Care | Payer: BC Managed Care – PPO | Attending: Emergency Medicine | Admitting: Emergency Medicine

## 2011-01-29 ENCOUNTER — Encounter: Payer: Self-pay | Admitting: *Deleted

## 2011-01-29 ENCOUNTER — Emergency Department (HOSPITAL_COMMUNITY): Payer: BC Managed Care – PPO

## 2011-01-29 DIAGNOSIS — G43009 Migraine without aura, not intractable, without status migrainosus: Secondary | ICD-10-CM

## 2011-01-29 DIAGNOSIS — H539 Unspecified visual disturbance: Secondary | ICD-10-CM | POA: Insufficient documentation

## 2011-01-29 DIAGNOSIS — R110 Nausea: Secondary | ICD-10-CM | POA: Insufficient documentation

## 2011-01-29 DIAGNOSIS — F17200 Nicotine dependence, unspecified, uncomplicated: Secondary | ICD-10-CM | POA: Insufficient documentation

## 2011-01-29 DIAGNOSIS — G43809 Other migraine, not intractable, without status migrainosus: Secondary | ICD-10-CM | POA: Insufficient documentation

## 2011-01-29 DIAGNOSIS — Z9889 Other specified postprocedural states: Secondary | ICD-10-CM | POA: Insufficient documentation

## 2011-01-29 DIAGNOSIS — Z79899 Other long term (current) drug therapy: Secondary | ICD-10-CM | POA: Insufficient documentation

## 2011-01-29 MED ORDER — DEXAMETHASONE SODIUM PHOSPHATE 10 MG/ML IJ SOLN
10.0000 mg | Freq: Once | INTRAMUSCULAR | Status: AC
  Administered 2011-01-29: 10 mg via INTRAMUSCULAR
  Filled 2011-01-29: qty 1

## 2011-01-29 MED ORDER — ALBUTEROL SULFATE HFA 108 (90 BASE) MCG/ACT IN AERS
2.0000 | INHALATION_SPRAY | Freq: Once | RESPIRATORY_TRACT | Status: AC
  Administered 2011-01-29: 2 via RESPIRATORY_TRACT
  Filled 2011-01-29: qty 6.7

## 2011-01-29 MED ORDER — ALBUTEROL SULFATE HFA 108 (90 BASE) MCG/ACT IN AERS: 2.0000 | INHALATION_SPRAY | RESPIRATORY_TRACT | Status: DC | PRN

## 2011-01-29 MED ORDER — DIPHENHYDRAMINE HCL 25 MG PO CAPS
25.0000 mg | ORAL_CAPSULE | Freq: Once | ORAL | Status: AC
  Administered 2011-01-29: 25 mg via ORAL
  Filled 2011-01-29: qty 1

## 2011-01-29 MED ORDER — PROMETHAZINE HCL 25 MG/ML IJ SOLN
25.0000 mg | Freq: Once | INTRAMUSCULAR | Status: AC
  Administered 2011-01-29: 25 mg via INTRAMUSCULAR
  Filled 2011-01-29 (×2): qty 1

## 2011-01-29 NOTE — ED Provider Notes (Signed)
History     CSN: [id number] Arrival date & time: 01/29/2011  4:43 PM   First MD Initiated Contact with Patient 01/29/11 1923      Chief Complaint  Patient presents with  . Migraine    (Consider location/radiation/quality/duration/timing/severity/associated sxs/prior treatment) HPI  History reviewed. No pertinent past medical history.  Past Surgical History  Procedure Date  . Joint replacement     No family history on file.  History  Substance Use Topics  . Smoking status: Passive Smoker -- 0.0 packs/day  . Smokeless tobacco: Not on file  . Alcohol Use: No    OB History    Grav Para Term Preterm Abortions TAB SAB Ect Mult Living                  Review of Systems  Allergies  Topamax; Advil; Aleve; Bee; Echinacea; and Sulfa antibiotics  Home Medications   Current Outpatient Rx  Name Route Sig Dispense Refill  . CITALOPRAM HYDROBROMIDE 20 MG PO TABS Oral Take 20 mg by mouth every evening.      Marland Kitchen ELETRIPTAN HYDROBROMIDE 40 MG PO TABS Oral One tablet by mouth as needed for migraine headache.  If the headache improves and then returns, dose may be repeated after 2 hours have elapsed since first dose (do not exceed 80 mg per day). May repeat in 2 hours if necessary. For migraines.     Marland Kitchen LORATADINE 10 MG PO TABS Oral Take 20 mg by mouth 2 (two) times daily.      Marland Kitchen MONTELUKAST SODIUM 10 MG PO TABS Oral Take 10 mg by mouth every evening.      Marland Kitchen PREGABALIN 100 MG PO CAPS Oral Take 100 mg by mouth every evening.      Marland Kitchen RANITIDINE HCL 150 MG PO TABS Oral Take 150 mg by mouth 2 (two) times daily.      Marland Kitchen SIMVASTATIN 20 MG PO TABS Oral Take 20 mg by mouth every evening.        BP 128/56  Pulse 107  Temp 98.2 F (36.8 C)  Resp 20  SpO2 96%  Physical Exam  ED Course  Procedures (including critical care time)  Labs Reviewed - No data to display Ct Head Wo Contrast  01/29/2011  *RADIOLOGY REPORT*  Clinical Data: Sudden onset of right-sided headache and blurry vision.   CT HEAD WITHOUT CONTRAST  Technique:  Contiguous axial images were obtained from the base of the skull through the vertex without contrast.  Comparison: 01/03/2006  Findings: There is no mass effect, midline shift, or acute intracranial hemorrhage.  Mastoid air cells are clear.  Cranium is intact.  Visualized paranasal sinuses are clear.  IMPRESSION: Negative head CT.  Original Report Authenticated By: Donavan Burnet, M.D.     No diagnosis found.    MDM  I have discussed the results of the CT scan with thepatient and her husband and we agree that this is likely an atypical migraine presentation.  She states that her pain has improved and she feels like she can go home.  She is concerned about her cough and wheezing and is requesting an inhaler, which I will give.  She will follow up with her neurologist this coming week.        Heather Frederick, [place] 01/29/11 2231

## 2011-01-29 NOTE — ED Notes (Signed)
Still awaiting med from pharmacy.  Called and requested med again.

## 2011-01-29 NOTE — ED Notes (Signed)
She has a history of migraine headaches and she had a severe headache on Friday with some visual disturbances.  It is not as severe now but she still has a lasting headache.  Some nausea

## 2011-01-29 NOTE — ED Provider Notes (Cosign Needed)
History     CSN: [id number] Arrival date & time: 01/29/2011  4:43 PM   First MD Initiated Contact with Patient 01/29/11 1923      Chief Complaint  Patient presents with  . Migraine   Patient with a known history of migraine headaches. She states began having a "severe headache." On Friday. She has had some transient visual disturbances on the left. The headache is currently 2/10. Some mild nausea, no photophobia, no focal motor deficits. No neck pain, no fevers. Currently, no visual changes. Patient did take her Relpax, which did not help. She is on Lyrica and other medications chronically. Patient denies any trauma. She's had no vomiting. Denies any recent infections. (Consider location/radiation/quality/duration/timing/severity/associated sxs/prior treatment) Patient is a 55 y.o. female presenting with headaches.  Headache  This is a recurrent problem.    History reviewed. No pertinent past medical history.  Past Surgical History  Procedure Date  . Joint replacement     No family history on file.  History  Substance Use Topics  . Smoking status: Passive Smoker -- 0.0 packs/day  . Smokeless tobacco: Not on file  . Alcohol Use: No    OB History    Grav Para Term Preterm Abortions TAB SAB Ect Mult Living                  Review of Systems  Neurological: Positive for headaches.  All other systems reviewed and are negative.    Allergies  Topamax; Advil; Aleve; Bee; Echinacea; and Sulfa antibiotics  Home Medications   Current Outpatient Rx  Name Route Sig Dispense Refill  . CITALOPRAM HYDROBROMIDE 20 MG PO TABS Oral Take 20 mg by mouth every evening.      Marland Kitchen ELETRIPTAN HYDROBROMIDE 40 MG PO TABS Oral One tablet by mouth as needed for migraine headache.  If the headache improves and then returns, dose may be repeated after 2 hours have elapsed since first dose (do not exceed 80 mg per day). May repeat in 2 hours if necessary. For migraines.     Marland Kitchen LORATADINE 10 MG PO  TABS Oral Take 20 mg by mouth 2 (two) times daily.      Marland Kitchen MONTELUKAST SODIUM 10 MG PO TABS Oral Take 10 mg by mouth every evening.      Marland Kitchen PREGABALIN 100 MG PO CAPS Oral Take 100 mg by mouth every evening.      Marland Kitchen RANITIDINE HCL 150 MG PO TABS Oral Take 150 mg by mouth 2 (two) times daily.      Marland Kitchen SIMVASTATIN 20 MG PO TABS Oral Take 20 mg by mouth every evening.        BP 128/56  Pulse 107  Temp 98.2 F (36.8 C)  Resp 20  SpO2 96%  Physical Exam  Constitutional: She is oriented to person, place, and time. She appears well-developed and well-nourished.  HENT:  Head: Normocephalic and atraumatic.  Eyes: Conjunctivae and EOM are normal. Pupils are equal, round, and reactive to light.  Neck: Neck supple.  Cardiovascular: Normal rate and regular rhythm.  Exam reveals no gallop and no friction rub.   No murmur heard. Pulmonary/Chest: Breath sounds normal. She has no wheezes. She has no rales. She exhibits no tenderness.  Abdominal: Soft. Bowel sounds are normal. She exhibits no distension. There is no tenderness. There is no rebound and no guarding.  Musculoskeletal: Normal range of motion.  Neurological: She is alert and oriented to person, place, and time. She displays normal reflexes.  No cranial nerve deficit. She exhibits normal muscle tone. Coordination normal.       Patient is awake, alert, oriented, no acute distress. Cranial nerves III through XII are intact as tested. Grip strength is equal. No pronator drift. Reflexes are normal and equal and symmetric throughout. No pronator drift. No facial droop. Essentially normal. Neuro exam. Neck is supple.  Skin: Skin is warm and dry. No rash noted.  Psychiatric: She has a normal mood and affect.    ED Course  Procedures (including critical care time)  Labs Reviewed - No data to display No results found.   No diagnosis found.    MDM  Pt is seen and examined;  Initial history and physical completed.  Will follow.           Aveleen Nevers A. Patrica Duel, MD 01/29/11 1949

## 2011-01-29 NOTE — ED Notes (Signed)
Pt updated on delay.  Awaiting med from pharmacy.

## 2011-01-30 ENCOUNTER — Other Ambulatory Visit: Payer: Self-pay | Admitting: Obstetrics & Gynecology

## 2011-01-30 DIAGNOSIS — Z1231 Encounter for screening mammogram for malignant neoplasm of breast: Secondary | ICD-10-CM

## 2011-02-01 NOTE — ED Provider Notes (Signed)
Medical screening examination/treatment/procedure(s) were performed by non-physician practitioner and as supervising physician I was immediately available for consultation/collaboration.   Cledith Kamiya A. Amely Voorheis, MD 02/01/11 1456 

## 2011-02-15 ENCOUNTER — Other Ambulatory Visit: Payer: Self-pay | Admitting: Internal Medicine

## 2011-02-15 DIAGNOSIS — R52 Pain, unspecified: Secondary | ICD-10-CM

## 2011-02-20 ENCOUNTER — Ambulatory Visit
Admission: RE | Admit: 2011-02-20 | Discharge: 2011-02-20 | Disposition: A | Discharge status: Home / Self Care | Payer: BC Managed Care – PPO | Source: Ambulatory Visit | Attending: Obstetrics & Gynecology | Admitting: Obstetrics & Gynecology

## 2011-02-20 ENCOUNTER — Ambulatory Visit
Admission: RE | Admit: 2011-02-20 | Discharge: 2011-02-20 | Disposition: A | Discharge status: Home / Self Care | Payer: BC Managed Care – PPO | Source: Ambulatory Visit | Attending: Internal Medicine | Admitting: Internal Medicine

## 2011-02-20 DIAGNOSIS — Z1231 Encounter for screening mammogram for malignant neoplasm of breast: Secondary | ICD-10-CM

## 2011-02-20 DIAGNOSIS — R52 Pain, unspecified: Secondary | ICD-10-CM

## 2011-02-25 HISTORY — PX: KNEE ARTHROSCOPY: SUR90

## 2011-03-29 ENCOUNTER — Ambulatory Visit
Admission: RE | Admit: 2011-03-29 | Discharge: 2011-03-29 | Disposition: A | Discharge status: Home / Self Care | Payer: BC Managed Care – PPO | Source: Ambulatory Visit | Attending: Orthopaedic Surgery | Admitting: Orthopaedic Surgery

## 2011-03-29 ENCOUNTER — Other Ambulatory Visit: Payer: Self-pay | Admitting: Orthopaedic Surgery

## 2011-03-29 DIAGNOSIS — R609 Edema, unspecified: Secondary | ICD-10-CM

## 2011-03-29 DIAGNOSIS — R52 Pain, unspecified: Secondary | ICD-10-CM

## 2011-04-18 ENCOUNTER — Other Ambulatory Visit: Payer: Self-pay | Admitting: Gastroenterology

## 2011-04-18 ENCOUNTER — Ambulatory Visit
Admission: RE | Admit: 2011-04-18 | Discharge: 2011-04-18 | Disposition: A | Discharge status: Home / Self Care | Payer: BC Managed Care – PPO | Source: Ambulatory Visit | Attending: Gastroenterology | Admitting: Gastroenterology

## 2011-05-05 ENCOUNTER — Other Ambulatory Visit: Payer: Self-pay | Admitting: Gastroenterology

## 2011-06-27 ENCOUNTER — Other Ambulatory Visit: Payer: Self-pay | Admitting: Neurology

## 2011-06-27 DIAGNOSIS — R4701 Aphasia: Secondary | ICD-10-CM

## 2011-06-27 DIAGNOSIS — G43019 Migraine without aura, intractable, without status migrainosus: Secondary | ICD-10-CM

## 2011-07-01 ENCOUNTER — Ambulatory Visit
Admission: RE | Admit: 2011-07-01 | Discharge: 2011-07-01 | Disposition: A | Discharge status: Home / Self Care | Payer: BC Managed Care – PPO | Source: Ambulatory Visit | Attending: Neurology | Admitting: Neurology

## 2011-07-01 DIAGNOSIS — R4701 Aphasia: Secondary | ICD-10-CM

## 2011-07-01 DIAGNOSIS — G43019 Migraine without aura, intractable, without status migrainosus: Secondary | ICD-10-CM

## 2011-12-22 ENCOUNTER — Other Ambulatory Visit: Payer: Self-pay | Admitting: Internal Medicine

## 2011-12-22 DIAGNOSIS — R1011 Right upper quadrant pain: Secondary | ICD-10-CM

## 2011-12-29 ENCOUNTER — Ambulatory Visit
Admission: RE | Admit: 2011-12-29 | Discharge: 2011-12-29 | Disposition: A | Discharge status: Home / Self Care | Payer: BC Managed Care – PPO | Source: Ambulatory Visit | Attending: Internal Medicine | Admitting: Internal Medicine

## 2011-12-29 DIAGNOSIS — R1011 Right upper quadrant pain: Secondary | ICD-10-CM

## 2012-01-23 ENCOUNTER — Other Ambulatory Visit: Payer: Self-pay | Admitting: Obstetrics & Gynecology

## 2012-01-23 DIAGNOSIS — Z1231 Encounter for screening mammogram for malignant neoplasm of breast: Secondary | ICD-10-CM

## 2012-02-23 ENCOUNTER — Ambulatory Visit
Admission: RE | Admit: 2012-02-23 | Discharge: 2012-02-23 | Disposition: A | Discharge status: Home / Self Care | Payer: BC Managed Care – PPO | Source: Ambulatory Visit | Attending: Obstetrics & Gynecology | Admitting: Obstetrics & Gynecology

## 2012-02-23 DIAGNOSIS — Z1231 Encounter for screening mammogram for malignant neoplasm of breast: Secondary | ICD-10-CM

## 2012-05-21 ENCOUNTER — Other Ambulatory Visit: Payer: Self-pay | Admitting: Geriatric Medicine

## 2012-05-21 ENCOUNTER — Ambulatory Visit
Admission: RE | Admit: 2012-05-21 | Discharge: 2012-05-21 | Disposition: A | Discharge status: Home / Self Care | Payer: BC Managed Care – PPO | Source: Ambulatory Visit | Attending: Geriatric Medicine | Admitting: Geriatric Medicine

## 2012-05-21 DIAGNOSIS — R102 Pelvic and perineal pain: Secondary | ICD-10-CM

## 2012-05-21 DIAGNOSIS — R109 Unspecified abdominal pain: Secondary | ICD-10-CM

## 2012-05-21 DIAGNOSIS — R4701 Aphasia: Secondary | ICD-10-CM | POA: Insufficient documentation

## 2012-05-21 DIAGNOSIS — R42 Dizziness and giddiness: Secondary | ICD-10-CM | POA: Insufficient documentation

## 2012-08-26 ENCOUNTER — Encounter (HOSPITAL_COMMUNITY): Payer: Self-pay | Admitting: Pharmacy Technician

## 2012-08-27 NOTE — Pre-Procedure Instructions (Signed)
Heather Frederick  08/27/2012   Your procedure is scheduled on:  Tuesday, June 17th  Report to [facility] Stay [facility] at 0530 AM. Come to main entrance "A" and go to east elevators up to 3rd floor. Check in at short stay desk.  Call this number if you have problems the morning of surgery: [phone]   Remember:   Do not eat food or drink liquids after midnight.   Take these medicines the morning of surgery with A SIP OF WATER: Tylenol if needed, Protonix   Do not wear jewelry, make-up or nail polish.  Do not wear lotions, powders, or perfume, deodorant.  Do not shave 48 hours prior to surgery. Men may shave face and neck.  Do not bring valuables to the hospital.  [facility] is not responsible  for any belongings or valuables.  Contacts, dentures or bridgework may not be worn into surgery.  Leave suitcase in the car. After surgery it may be brought to your room.  For patients admitted to the hospital, checkout time is 11:00 AM the day of discharge.   Patients discharged the day of surgery will not be allowed to drive home.    Special Instructions: Shower using CHG 2 nights before surgery and the night before surgery.  If you shower the day of surgery use CHG.  Use special wash - you have one bottle of CHG for all showers.  You should use approximately 1/3 of the bottle for each shower.   Please read over the following fact sheets that you were given: Pain Booklet, Coughing and Deep Breathing, Blood Transfusion Information, MRSA Information and Surgical Site Infection Prevention

## 2012-08-28 ENCOUNTER — Encounter (HOSPITAL_COMMUNITY): Payer: Self-pay

## 2012-08-28 ENCOUNTER — Encounter (HOSPITAL_COMMUNITY)
Admission: RE | Admit: 2012-08-28 | Discharge: 2012-08-28 | Disposition: A | Discharge status: Home / Self Care | Payer: BC Managed Care – PPO | Source: Ambulatory Visit | Attending: Orthopaedic Surgery | Admitting: Orthopaedic Surgery

## 2012-08-28 DIAGNOSIS — Z01812 Encounter for preprocedural laboratory examination: Secondary | ICD-10-CM | POA: Insufficient documentation

## 2012-08-28 DIAGNOSIS — Z01818 Encounter for other preprocedural examination: Secondary | ICD-10-CM | POA: Insufficient documentation

## 2012-08-28 HISTORY — DX: Gastro-esophageal reflux disease without esophagitis: K21.9

## 2012-08-28 HISTORY — DX: Chronic kidney disease, unspecified: N18.9

## 2012-08-28 HISTORY — DX: Anxiety disorder, unspecified: F41.9

## 2012-08-28 HISTORY — DX: Unspecified hearing loss, unspecified ear: H91.90

## 2012-08-28 HISTORY — DX: Family history of other specified conditions: Z84.89

## 2012-08-28 HISTORY — DX: Mental disorder, not otherwise specified: F99

## 2012-08-28 HISTORY — DX: Unspecified osteoarthritis, unspecified site: M19.90

## 2012-08-28 LAB — COMPREHENSIVE METABOLIC PANEL
ALT: 43 U/L — ABNORMAL HIGH (ref 0–35)
AST: 30 U/L (ref 0–37)
Albumin: 3.8 g/dL (ref 3.5–5.2)
Chloride: 105 mEq/L (ref 96–112)
Creatinine, Ser: 0.75 mg/dL (ref 0.50–1.10)
Potassium: 4 mEq/L (ref 3.5–5.1)
Sodium: 141 mEq/L (ref 135–145)
Total Bilirubin: 0.4 mg/dL (ref 0.3–1.2)

## 2012-08-28 LAB — URINE MICROSCOPIC-ADD ON

## 2012-08-28 LAB — URINALYSIS, ROUTINE W REFLEX MICROSCOPIC
Ketones, ur: NEGATIVE mg/dL
Nitrite: NEGATIVE
Protein, ur: NEGATIVE mg/dL
pH: 6 (ref 5.0–8.0)

## 2012-08-28 LAB — CBC
MCV: 86.1 fL (ref 78.0–100.0)
Platelets: 194 10*3/uL (ref 150–400)
RDW: 13.1 % (ref 11.5–15.5)
WBC: 5.9 10*3/uL (ref 4.0–10.5)

## 2012-08-28 LAB — TYPE AND SCREEN

## 2012-08-28 LAB — SURGICAL PCR SCREEN
MRSA, PCR: NEGATIVE
Staphylococcus aureus: NEGATIVE

## 2012-08-28 LAB — ABO/RH: ABO/RH(D): A POS

## 2012-08-28 LAB — APTT: aPTT: 26 seconds (ref 24–37)

## 2012-08-28 LAB — PROTIME-INR: INR: 1.01 (ref 0.00–1.49)

## 2012-08-28 NOTE — Progress Notes (Signed)
Cardiologist: Dr. Verdis Prime. Denies any heart problems, stated several years ago had chest pain and had heart cath, negative ,and sees Dr. Katrinka Blazing as needed. Stated she saw him 1-2 months ago per Dr. Cleophas Dunker. Will request notes , ekg and any other studies if available.  Also will request cxr from Dr. Clent Ridges at [facility].

## 2012-08-29 LAB — URINE CULTURE

## 2012-09-02 NOTE — H&P (Signed)
CHIEF COMPLAINT:  Painful left knee.    HISTORY OF PRESENT ILLNESS:  Heather Frederick is a very pleasant 57 year old white female who is seen today for evaluation of her left knee.  She has had problems with pain in both knees for many years.  She has had multiple cortisone injections to the knee in the past.  She has also had arthroscopic debridement of the left knee in December 2012, which included that of chondroplasty of the medial femoral condyle and patellofemoral joint with plical band excision.  She has continued to have increasing pain and discomfort in the knee to the point now where she is having pain almost with every step.  She is having symptoms of giving way, popping and occasional catching without true locking though.  Difficulty with her activities of daily living including that of stairs.  She states that her pain is moderately severe and more of an aching, throbbing pain with occasional sharpness to it.  She has tried all types of medications as well as cortisone and viscosupplementation and now has started having minimal if any benefit from everything.  She is allergic to anti-inflammatories, or at least she cannot tolerate them, which is unfortunate, because she does not have that modality to help with her symptoms.  She is seen today for evaluation.     PAST MEDICAL HISTORY:  Her health is, in general, good.     PAST SURGICAL HISTORY:   1.  In 2012 left knee arthroscopy.  2.  In 2012 right radial tunnel release. 3.  In 2011 left rotator cuff repair. 4.  In 2010 right plantar fascial release. 5.  In 2010 bladder sling.  6.  In 2009 left plantar fascial release. 7.  In 2008 left carpal tunnel. 8.  In 2008 right carpal tunnel. 9.  In 2006 right knee arthroscopy. 10.  In 2004 tennis elbow release.  11.  Partial hysterectomy in 2004.  12.  In 2003 cardiac catheterization.  13.  In 1992 and 1985 childbirth.     CURRENT MEDICATIONS:  1.  Simvastatin 20 mg at bedtime. 2.  Citalopram 20 mg  at bedtime. 3.  Lyrica 200 mg at bedtime. 4.  Relpax 40 mg as needed for migraines. 5.  Frova 2.5 mg as needed up to 2 times daily for migraines. 6.  Protonix 40 mg at bedtime. 7.  Sanctura 20 mg q.a.m.  8.  Claritin 10 mg b.i.d.  9.  Multivitamin daily. 10.  Lutein 25 mg. 11.  Zeaxanthin 5 mg daily.  12.  Fish oil 1400 mg with omega-3 at 980 mg daily in the morning. 13.  Glucosamine 750 mg and chondroitin 400 mg/MSM 375 mg daily in the morning. 14.  Vitamin B12 at 5000 mcg daily. 15.  Cranberry 300 mg daily.   ALLERGIES:   1.  Topamax causes stroke-like symptoms.   2.  Advil - hives. 3.  Aleve - hives.  4.  Sulfa - hives. 5.  Echinacea- hives. 6.  Feathers - sinus and sneezing. 7.  Bee sting - vomiting, fever and extreme swelling.    REVIEW OF SYSTEMS:  A 14 point review of systems is positive for decreased hearing as well as cataracts and glasses.  She does have a history of bronchitis.  Skin rashes because of multiple allergies.  Recent bladder infection 2 weeks ago and on Cipro last week.  She has frequent urination only about 2 times a day.  She does have problems with headaches and migraines as well  as nervous tension and depression.    FAMILY HISTORY:  Positive for a mother who is still alive at age 31 with heart disease, hypertension and arthritis.  Her father who is still alive at 59 with heart disease, gout, hypertension, diabetes and arthritis.  Her brother who is age 73 and one brother who is deceased at age 24 from renal failure.  They have heart disease, gout, hypertension, arthritis and renal disease.  No sisters.     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  She does not smoke.  She drinks 1-2 times weekly.     PHYSICAL EXAMINATION:  Reveals a 57 year old white female well developed, well nourished, alert, pleasant and cooperative in moderate distress secondary to left knee pain.  Height 5 feet 3 inches,  weight 266 pounds and BMI is 47.1.  Vital signs reveal a temperature of 96.4, pulse 89, respiration 16, blood pressure 138/90.   Head is normocephalic. Eyes:  Pupils equal, round, reactive to light and accommodation with extraocular movements intact.  Ear, nose and throat reveal decreased hearing bilaterally.   Chest had good expansion.  Lungs are essentially clear.  Cardiac had a regular rhythm and rate with normal S1 and S2.  No discrete murmurs, rubs or gallops appreciated.   Pulse is 1+ bilateral and symmetric in the lower extremities. Abdomen is obese, soft and nontender with no masses palpable.  Normal bowel sounds present.  Genital, rectal and breast exam not indicated for the orthopedic evaluation. CNS:  Oriented x3 and cranial nerves II-XII grossly intact. Musculoskeletal:  She has range of motion from about 2-3 degrees to 120 degrees.  She does have crepitus with range of motion.  Effusion is noted in the left knee.  Pseudolaxity on the left with valgus stressing.  Painful over the medial joint line.  Good motion of both hips.  Neurovascularly intact bilaterally.     RADIOGRAPHS:  Radiographic studies reveal the left knee with sclerosis of the medial compartment.  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  She is almost down to bone on bone in the medial compartment.  She does have some mild patellofemoral OA also.     CLINICAL IMPRESSION:  End-stage osteoarthritis of the left knee.   RECOMMENDATIONS:  At this time our plan is to consider total knee replacement.  I have reviewed her clearance form from [facility] and they feel that she is medically cleared to proceed with total joint replacement on the left.  The procedures, risks and benefits were explained to her in detail and all questions were answered for her.  She will plan on proceeding in the near future with the right knee replacement on the left.    Oris Drone Aleda Grana [facility]  Orthopedics [phone]  09/02/2012 12:14 PM

## 2012-09-09 MED ORDER — CEFAZOLIN SODIUM 10 G IJ SOLR
3.0000 g | INTRAMUSCULAR | Status: AC
  Administered 2012-09-10: 3 g via INTRAVENOUS
  Filled 2012-09-09: qty 3000

## 2012-09-10 ENCOUNTER — Encounter (HOSPITAL_COMMUNITY)
Admission: RE | Disposition: A | Discharge status: Home Health | Payer: Self-pay | Source: Ambulatory Visit | Attending: Orthopaedic Surgery

## 2012-09-10 ENCOUNTER — Inpatient Hospital Stay (HOSPITAL_COMMUNITY): Payer: BC Managed Care – PPO

## 2012-09-10 ENCOUNTER — Inpatient Hospital Stay (HOSPITAL_COMMUNITY): Payer: BC Managed Care – PPO | Admitting: Certified Registered"

## 2012-09-10 ENCOUNTER — Inpatient Hospital Stay (HOSPITAL_COMMUNITY)
Admission: RE | Admit: 2012-09-10 | Discharge: 2012-09-12 | DRG: 209 | Disposition: A | Discharge status: Home Health | Payer: BC Managed Care – PPO | Source: Ambulatory Visit | Attending: Orthopaedic Surgery | Admitting: Orthopaedic Surgery

## 2012-09-10 ENCOUNTER — Encounter (HOSPITAL_COMMUNITY): Payer: Self-pay | Admitting: *Deleted

## 2012-09-10 ENCOUNTER — Encounter (HOSPITAL_COMMUNITY): Payer: Self-pay | Admitting: Certified Registered"

## 2012-09-10 DIAGNOSIS — M25469 Effusion, unspecified knee: Secondary | ICD-10-CM | POA: Diagnosis present

## 2012-09-10 DIAGNOSIS — G43909 Migraine, unspecified, not intractable, without status migrainosus: Secondary | ICD-10-CM | POA: Diagnosis present

## 2012-09-10 DIAGNOSIS — M8569 Other cyst of bone, multiple sites: Secondary | ICD-10-CM | POA: Diagnosis present

## 2012-09-10 DIAGNOSIS — M1710 Unilateral primary osteoarthritis, unspecified knee: Principal | ICD-10-CM | POA: Diagnosis present

## 2012-09-10 DIAGNOSIS — Z6841 Body Mass Index (BMI) 40.0 and over, adult: Secondary | ICD-10-CM

## 2012-09-10 DIAGNOSIS — M1712 Unilateral primary osteoarthritis, left knee: Secondary | ICD-10-CM

## 2012-09-10 HISTORY — PX: TOTAL KNEE ARTHROPLASTY: SHX125

## 2012-09-10 LAB — COMPREHENSIVE METABOLIC PANEL
ALT: 34 U/L (ref 0–35)
AST: 23 U/L (ref 0–37)
Calcium: 8.7 mg/dL (ref 8.4–10.5)
Sodium: 141 mEq/L (ref 135–145)
Total Protein: 6.5 g/dL (ref 6.0–8.3)

## 2012-09-10 LAB — CBC
MCH: 29.8 pg (ref 26.0–34.0)
MCHC: 33.9 g/dL (ref 30.0–36.0)
Platelets: 153 10*3/uL (ref 150–400)
RBC: 4.46 MIL/uL (ref 3.87–5.11)

## 2012-09-10 LAB — APTT: aPTT: 28 seconds (ref 24–37)

## 2012-09-10 SURGERY — ARTHROPLASTY, KNEE, TOTAL
Anesthesia: General | Site: Knee | Laterality: Left | Wound class: Clean

## 2012-09-10 MED ORDER — CHLORHEXIDINE GLUCONATE 4 % EX LIQD
60.0000 mL | Freq: Once | CUTANEOUS | Status: DC
  Filled 2012-09-10: qty 60

## 2012-09-10 MED ORDER — SUMATRIPTAN SUCCINATE 50 MG PO TABS
50.0000 mg | ORAL_TABLET | ORAL | Status: DC | PRN
  Filled 2012-09-10: qty 1

## 2012-09-10 MED ORDER — METHOCARBAMOL 500 MG PO TABS
ORAL_TABLET | ORAL | Status: AC
  Administered 2012-09-10: 500 mg
  Filled 2012-09-10: qty 1

## 2012-09-10 MED ORDER — ALUM & MAG HYDROXIDE-SIMETH 200-200-20 MG/5ML PO SUSP: 30.0000 mL | ORAL | Status: DC | PRN

## 2012-09-10 MED ORDER — MENTHOL 3 MG MT LOZG: 1.0000 | LOZENGE | OROMUCOSAL | Status: DC | PRN

## 2012-09-10 MED ORDER — HYDROMORPHONE HCL PF 1 MG/ML IJ SOLN
INTRAMUSCULAR | Status: AC
  Filled 2012-09-10: qty 1

## 2012-09-10 MED ORDER — RIVAROXABAN 10 MG PO TABS
10.0000 mg | ORAL_TABLET | ORAL | Status: DC
  Administered 2012-09-10 – 2012-09-11 (×2): 10 mg via ORAL
  Filled 2012-09-10 (×3): qty 1

## 2012-09-10 MED ORDER — MIDAZOLAM HCL 5 MG/5ML IJ SOLN
INTRAMUSCULAR | Status: DC | PRN
  Administered 2012-09-10: 2 mg via INTRAVENOUS

## 2012-09-10 MED ORDER — SODIUM CHLORIDE 0.9 % IR SOLN
Status: DC | PRN
  Administered 2012-09-10: 1000 mL
  Administered 2012-09-10: 3000 mL

## 2012-09-10 MED ORDER — CITALOPRAM HYDROBROMIDE 20 MG PO TABS
20.0000 mg | ORAL_TABLET | Freq: Every evening | ORAL | Status: DC
  Administered 2012-09-10 – 2012-09-11 (×2): 20 mg via ORAL
  Filled 2012-09-10 (×3): qty 1

## 2012-09-10 MED ORDER — CEFAZOLIN SODIUM-DEXTROSE 2-3 GM-% IV SOLR
2.0000 g | Freq: Four times a day (QID) | INTRAVENOUS | Status: AC
  Administered 2012-09-10 (×2): 2 g via INTRAVENOUS
  Filled 2012-09-10 (×2): qty 50

## 2012-09-10 MED ORDER — METHOCARBAMOL 500 MG PO TABS
500.0000 mg | ORAL_TABLET | Freq: Four times a day (QID) | ORAL | Status: DC | PRN
  Filled 2012-09-10: qty 1

## 2012-09-10 MED ORDER — DARIFENACIN HYDROBROMIDE ER 7.5 MG PO TB24
7.5000 mg | ORAL_TABLET | Freq: Every day | ORAL | Status: DC
  Administered 2012-09-12: 7.5 mg via ORAL
  Filled 2012-09-10 (×3): qty 1

## 2012-09-10 MED ORDER — LACTATED RINGERS IV SOLN
INTRAVENOUS | Status: DC | PRN
  Administered 2012-09-10 (×2): via INTRAVENOUS

## 2012-09-10 MED ORDER — OXYCODONE HCL 5 MG PO TABS
ORAL_TABLET | ORAL | Status: AC
  Filled 2012-09-10: qty 1

## 2012-09-10 MED ORDER — ALBUTEROL SULFATE HFA 108 (90 BASE) MCG/ACT IN AERS
INHALATION_SPRAY | RESPIRATORY_TRACT | Status: DC | PRN
  Administered 2012-09-10: 2 via RESPIRATORY_TRACT

## 2012-09-10 MED ORDER — LIDOCAINE HCL (CARDIAC) 20 MG/ML IV SOLN
INTRAVENOUS | Status: DC | PRN
  Administered 2012-09-10: 60 mg via INTRAVENOUS

## 2012-09-10 MED ORDER — NEOSTIGMINE METHYLSULFATE 1 MG/ML IJ SOLN
INTRAMUSCULAR | Status: DC | PRN
  Administered 2012-09-10: 3 mg via INTRAVENOUS

## 2012-09-10 MED ORDER — BISACODYL 10 MG RE SUPP: 10.0000 mg | Freq: Every day | RECTAL | Status: DC | PRN

## 2012-09-10 MED ORDER — MAGNESIUM HYDROXIDE 400 MG/5ML PO SUSP: 30.0000 mL | Freq: Every day | ORAL | Status: DC | PRN

## 2012-09-10 MED ORDER — PREGABALIN 50 MG PO CAPS
200.0000 mg | ORAL_CAPSULE | Freq: Every evening | ORAL | Status: DC
  Administered 2012-09-10 – 2012-09-11 (×2): 200 mg via ORAL
  Filled 2012-09-10 (×2): qty 4

## 2012-09-10 MED ORDER — DOCUSATE SODIUM 100 MG PO CAPS
100.0000 mg | ORAL_CAPSULE | Freq: Two times a day (BID) | ORAL | Status: DC
  Administered 2012-09-10 – 2012-09-12 (×4): 100 mg via ORAL
  Filled 2012-09-10 (×5): qty 1

## 2012-09-10 MED ORDER — ONDANSETRON HCL 4 MG PO TABS: 4.0000 mg | ORAL_TABLET | Freq: Four times a day (QID) | ORAL | Status: DC | PRN

## 2012-09-10 MED ORDER — METOCLOPRAMIDE HCL 10 MG PO TABS: 5.0000 mg | ORAL_TABLET | Freq: Three times a day (TID) | ORAL | Status: DC | PRN

## 2012-09-10 MED ORDER — METOCLOPRAMIDE HCL 5 MG/ML IJ SOLN: 5.0000 mg | Freq: Three times a day (TID) | INTRAMUSCULAR | Status: DC | PRN

## 2012-09-10 MED ORDER — THROMBIN 20000 UNITS EX KIT
PACK | CUTANEOUS | Status: AC
  Filled 2012-09-10: qty 1

## 2012-09-10 MED ORDER — PANTOPRAZOLE SODIUM 40 MG PO TBEC
40.0000 mg | DELAYED_RELEASE_TABLET | Freq: Every evening | ORAL | Status: DC
  Administered 2012-09-10 – 2012-09-11 (×2): 40 mg via ORAL
  Filled 2012-09-10 (×2): qty 1

## 2012-09-10 MED ORDER — DEXTROSE 5 % IV SOLN
3.0000 g | INTRAVENOUS | Status: DC
  Filled 2012-09-10: qty 3000

## 2012-09-10 MED ORDER — PHENYLEPHRINE HCL 10 MG/ML IJ SOLN
INTRAMUSCULAR | Status: DC | PRN
  Administered 2012-09-10 (×4): 80 ug via INTRAVENOUS

## 2012-09-10 MED ORDER — PHENOL 1.4 % MT LIQD: 1.0000 | OROMUCOSAL | Status: DC | PRN

## 2012-09-10 MED ORDER — ACETAMINOPHEN 10 MG/ML IV SOLN
INTRAVENOUS | Status: AC
  Filled 2012-09-10: qty 100

## 2012-09-10 MED ORDER — GLYCOPYRROLATE 0.2 MG/ML IJ SOLN
INTRAMUSCULAR | Status: DC | PRN
  Administered 2012-09-10: 0.4 mg via INTRAVENOUS

## 2012-09-10 MED ORDER — BUPIVACAINE-EPINEPHRINE 0.25% -1:200000 IJ SOLN
INTRAMUSCULAR | Status: DC | PRN
  Administered 2012-09-10: 30 mL

## 2012-09-10 MED ORDER — SODIUM CHLORIDE 0.9 % IV SOLN: INTRAVENOUS | Status: DC

## 2012-09-10 MED ORDER — ONDANSETRON HCL 4 MG/2ML IJ SOLN
INTRAMUSCULAR | Status: DC | PRN
  Administered 2012-09-10: 4 mg via INTRAVENOUS

## 2012-09-10 MED ORDER — METHOCARBAMOL 100 MG/ML IJ SOLN
500.0000 mg | Freq: Four times a day (QID) | INTRAVENOUS | Status: DC | PRN
  Filled 2012-09-10: qty 5

## 2012-09-10 MED ORDER — NYSTATIN 100000 UNIT/GM EX CREA
1.0000 "application " | TOPICAL_CREAM | Freq: Every day | CUTANEOUS | Status: DC | PRN
  Filled 2012-09-10: qty 15

## 2012-09-10 MED ORDER — SODIUM CHLORIDE 0.9 % IV SOLN
75.0000 mL/h | INTRAVENOUS | Status: AC
  Administered 2012-09-10: 75 mL/h via INTRAVENOUS

## 2012-09-10 MED ORDER — ACETAMINOPHEN 10 MG/ML IV SOLN
1000.0000 mg | Freq: Once | INTRAVENOUS | Status: DC
  Filled 2012-09-10: qty 100

## 2012-09-10 MED ORDER — OXYCODONE HCL 5 MG PO TABS
5.0000 mg | ORAL_TABLET | ORAL | Status: DC | PRN
  Administered 2012-09-10: 10 mg via ORAL
  Administered 2012-09-10: 5 mg via ORAL
  Administered 2012-09-10 – 2012-09-12 (×4): 10 mg via ORAL
  Filled 2012-09-10 (×7): qty 2

## 2012-09-10 MED ORDER — HYDROMORPHONE HCL PF 1 MG/ML IJ SOLN
1.0000 mg | INTRAMUSCULAR | Status: DC | PRN
  Administered 2012-09-10 – 2012-09-11 (×3): 1 mg via INTRAVENOUS
  Filled 2012-09-10 (×3): qty 1

## 2012-09-10 MED ORDER — FLEET ENEMA 7-19 GM/118ML RE ENEM: 1.0000 | ENEMA | Freq: Once | RECTAL | Status: AC | PRN

## 2012-09-10 MED ORDER — ELETRIPTAN HYDROBROMIDE 40 MG PO TABS
40.0000 mg | ORAL_TABLET | ORAL | Status: DC | PRN
  Administered 2012-09-10 – 2012-09-12 (×4): 40 mg via ORAL
  Filled 2012-09-10 (×5): qty 1

## 2012-09-10 MED ORDER — ACETAMINOPHEN 500 MG PO TABS: 1000.0000 mg | ORAL_TABLET | Freq: Once | ORAL | Status: DC

## 2012-09-10 MED ORDER — CHLORHEXIDINE GLUCONATE 4 % EX LIQD: 60.0000 mL | Freq: Every day | CUTANEOUS | Status: DC

## 2012-09-10 MED ORDER — SUCCINYLCHOLINE CHLORIDE 20 MG/ML IJ SOLN
INTRAMUSCULAR | Status: DC | PRN
  Administered 2012-09-10: 100 mg via INTRAVENOUS

## 2012-09-10 MED ORDER — CHLORHEXIDINE GLUCONATE 4 % EX LIQD
60.0000 mL | Freq: Every day | CUTANEOUS | Status: DC
  Filled 2012-09-10: qty 60

## 2012-09-10 MED ORDER — HYDROMORPHONE HCL PF 1 MG/ML IJ SOLN
0.2500 mg | INTRAMUSCULAR | Status: DC | PRN
  Administered 2012-09-10 (×4): 0.5 mg via INTRAVENOUS

## 2012-09-10 MED ORDER — PROPOFOL 10 MG/ML IV BOLUS
INTRAVENOUS | Status: DC | PRN
  Administered 2012-09-10: 200 mg via INTRAVENOUS

## 2012-09-10 MED ORDER — THROMBIN 20000 UNITS EX KIT
PACK | CUTANEOUS | Status: DC | PRN
  Administered 2012-09-10: 20000 [IU] via TOPICAL

## 2012-09-10 MED ORDER — ONDANSETRON HCL 4 MG/2ML IJ SOLN
4.0000 mg | Freq: Four times a day (QID) | INTRAMUSCULAR | Status: DC | PRN
  Administered 2012-09-12: 4 mg via INTRAVENOUS
  Filled 2012-09-10: qty 2

## 2012-09-10 MED ORDER — ROCURONIUM BROMIDE 100 MG/10ML IV SOLN
INTRAVENOUS | Status: DC | PRN
  Administered 2012-09-10: 10 mg via INTRAVENOUS
  Administered 2012-09-10: 40 mg via INTRAVENOUS

## 2012-09-10 MED ORDER — SUFENTANIL CITRATE 50 MCG/ML IV SOLN
INTRAVENOUS | Status: DC | PRN
  Administered 2012-09-10: 5 ug via INTRAVENOUS
  Administered 2012-09-10: 10 ug via INTRAVENOUS
  Administered 2012-09-10: 15 ug via INTRAVENOUS
  Administered 2012-09-10: 20 ug via INTRAVENOUS

## 2012-09-10 MED ORDER — BUPIVACAINE-EPINEPHRINE PF 0.25-1:200000 % IJ SOLN
INTRAMUSCULAR | Status: AC
  Filled 2012-09-10: qty 30

## 2012-09-10 MED ORDER — CHLORHEXIDINE GLUCONATE 4 % EX LIQD: 60.0000 mL | Freq: Once | CUTANEOUS | Status: DC

## 2012-09-10 SURGICAL SUPPLY — 69 items
BANDAGE ESMARK 6X9 LF (GAUZE/BANDAGES/DRESSINGS) ×1 IMPLANT
BLADE SAGITTAL 25.0X1.19X90 (BLADE) ×2 IMPLANT
BNDG CMPR 9X6 STRL LF SNTH (GAUZE/BANDAGES/DRESSINGS) ×1
BNDG ESMARK 6X9 LF (GAUZE/BANDAGES/DRESSINGS) ×2
BOWL SMART MIX CTS (DISPOSABLE) ×2 IMPLANT
CAPT RP KNEE ×1 IMPLANT
CEMENT HV SMART SET (Cement) ×4 IMPLANT
CLOTH BEACON ORANGE TIMEOUT ST (SAFETY) ×2 IMPLANT
COVER BACK TABLE 24X17X13 BIG (DRAPES) ×2 IMPLANT
COVER SURGICAL LIGHT HANDLE (MISCELLANEOUS) ×2 IMPLANT
CUFF TOURNIQUET SINGLE 34IN LL (TOURNIQUET CUFF) ×1 IMPLANT
CUFF TOURNIQUET SINGLE 44IN (TOURNIQUET CUFF) IMPLANT
DRAPE EXTREMITY T 121X128X90 (DRAPE) ×2 IMPLANT
DRAPE PROXIMA HALF (DRAPES) ×2 IMPLANT
DRSG ADAPTIC 3X8 NADH LF (GAUZE/BANDAGES/DRESSINGS) ×2 IMPLANT
DRSG PAD ABDOMINAL 8X10 ST (GAUZE/BANDAGES/DRESSINGS) ×4 IMPLANT
DURAPREP 26ML APPLICATOR (WOUND CARE) ×3 IMPLANT
ELECT CAUTERY BLADE 6.4 (BLADE) ×2 IMPLANT
ELECT REM PT RETURN 9FT ADLT (ELECTROSURGICAL) ×2
ELECTRODE REM PT RTRN 9FT ADLT (ELECTROSURGICAL) ×1 IMPLANT
EVACUATOR 1/8 PVC DRAIN (DRAIN) IMPLANT
FACESHIELD LNG OPTICON STERILE (SAFETY) ×4 IMPLANT
FLOSEAL 10ML (HEMOSTASIS) IMPLANT
GLOVE BIOGEL PI IND STRL 6.5 (GLOVE) IMPLANT
GLOVE BIOGEL PI IND STRL 7.0 (GLOVE) IMPLANT
GLOVE BIOGEL PI IND STRL 8 (GLOVE) ×1 IMPLANT
GLOVE BIOGEL PI IND STRL 8.5 (GLOVE) ×1 IMPLANT
GLOVE BIOGEL PI INDICATOR 6.5 (GLOVE) ×2
GLOVE BIOGEL PI INDICATOR 7.0 (GLOVE) ×1
GLOVE BIOGEL PI INDICATOR 8 (GLOVE) ×1
GLOVE BIOGEL PI INDICATOR 8.5 (GLOVE) ×1
GLOVE BIOGEL PI ORTHO PRO 7.5 (GLOVE) ×1
GLOVE ECLIPSE 6.5 STRL STRAW (GLOVE) ×1 IMPLANT
GLOVE ECLIPSE 8.0 STRL XLNG CF (GLOVE) ×4 IMPLANT
GLOVE PI ORTHO PRO STRL 7.5 (GLOVE) IMPLANT
GLOVE SURG ORTHO 8.5 STRL (GLOVE) ×3 IMPLANT
GLOVE SURG SS PI 6.5 STRL IVOR (GLOVE) ×2 IMPLANT
GLOVE SURG SS PI 7.5 STRL IVOR (GLOVE) ×2 IMPLANT
GOWN PREVENTION PLUS XXLARGE (GOWN DISPOSABLE) ×2 IMPLANT
GOWN STRL NON-REIN LRG LVL3 (GOWN DISPOSABLE) ×5 IMPLANT
GOWN STRL REIN XL XLG (GOWN DISPOSABLE) ×1 IMPLANT
HANDPIECE INTERPULSE COAX TIP (DISPOSABLE) ×2
KIT BASIN OR (CUSTOM PROCEDURE TRAY) ×2 IMPLANT
KIT ROOM TURNOVER OR (KITS) ×2 IMPLANT
MANIFOLD NEPTUNE II (INSTRUMENTS) ×2 IMPLANT
NEEDLE 22X1 1/2 (OR ONLY) (NEEDLE) ×1 IMPLANT
NS IRRIG 1000ML POUR BTL (IV SOLUTION) ×2 IMPLANT
PACK TOTAL JOINT (CUSTOM PROCEDURE TRAY) ×2 IMPLANT
PAD ARMBOARD 7.5X6 YLW CONV (MISCELLANEOUS) ×4 IMPLANT
PAD CAST 4YDX4 CTTN HI CHSV (CAST SUPPLIES) ×1 IMPLANT
PADDING CAST COTTON 4X4 STRL (CAST SUPPLIES) ×2
PADDING CAST COTTON 6X4 STRL (CAST SUPPLIES) ×2 IMPLANT
SET HNDPC FAN SPRY TIP SCT (DISPOSABLE) ×1 IMPLANT
SPONGE GAUZE 4X4 12PLY (GAUZE/BANDAGES/DRESSINGS) ×2 IMPLANT
STAPLER VISISTAT 35W (STAPLE) ×2 IMPLANT
SUCTION FRAZIER TIP 10 FR DISP (SUCTIONS) ×2 IMPLANT
SUT BONE WAX W31G (SUTURE) ×2 IMPLANT
SUT ETHIBOND NAB CT1 #1 30IN (SUTURE) ×7 IMPLANT
SUT MNCRL AB 3-0 PS2 18 (SUTURE) ×2 IMPLANT
SUT VIC AB 0 CT1 27 (SUTURE) ×2
SUT VIC AB 0 CT1 27XBRD ANBCTR (SUTURE) ×1 IMPLANT
SUT VIC AB 1 CT1 27 (SUTURE) ×2
SUT VIC AB 1 CT1 27XBRD ANBCTR (SUTURE) ×1 IMPLANT
SYR CONTROL 10ML LL (SYRINGE) ×1 IMPLANT
TOWEL OR 17X24 6PK STRL BLUE (TOWEL DISPOSABLE) ×2 IMPLANT
TOWEL OR 17X26 10 PK STRL BLUE (TOWEL DISPOSABLE) ×2 IMPLANT
TRAY FOLEY CATH 14FR (SET/KITS/TRAYS/PACK) ×1 IMPLANT
WATER STERILE IRR 1000ML POUR (IV SOLUTION) ×4 IMPLANT
WRAP KNEE MAXI GEL POST OP (GAUZE/BANDAGES/DRESSINGS) ×2 IMPLANT

## 2012-09-10 NOTE — Progress Notes (Signed)
Orthopedic Tech Progress Note Patient Details:  Heather Frederick Nov 11, 1955 [id number] CPM applied to Left LE with appropriate settings.  CPM Left Knee CPM Left Knee: On Left Knee Flexion (Degrees): 60 Left Knee Extension (Degrees): 0   Asia R Thompson 09/10/2012, 11:13 AM

## 2012-09-10 NOTE — Evaluation (Signed)
Physical Therapy Evaluation Patient Details Name: Heather Frederick MRN: [id number] DOB: [birth year] Today's Date: 09/10/2012 Time: 1345-1416 PT Time Calculation (min): 31 min  PT Assessment / Plan / Recommendation Clinical Impression  57 yo female admitted for LTKA; Presents to PT with decr fucntional mobiltiy; Will benefit from acute PT to maximize independence and safety with moibility  and to enable safe dc home    PT Assessment  Patient needs continued PT services    Follow Up Recommendations  Home health PT;Supervision/Assistance - 24 hour    Does the patient have the potential to tolerate intense rehabilitation      Barriers to Discharge        Equipment Recommendations  Rolling walker with 5" wheels (3in1)    Recommendations for Other Services     Frequency 7X/week    Precautions / Restrictions Precautions Precautions: Knee Precaution Comments: Educated pt and husband re: knee prec Restrictions LLE Weight Bearing: Partial weight bearing LLE Partial Weight Bearing Percentage or Pounds: 50   Pertinent Vitals/Pain 9/10 knee patient repositioned for comfort Ice applied      Mobility  Bed Mobility Bed Mobility: Supine to Sit;Sitting - Scoot to Edge of Bed Supine to Sit: 4: Min assist;With rails Sitting - Scoot to [place] of Bed: 4: Min assist;With rail Details for Bed Mobility Assistance: Cues fro technique; Min assist for LLE support coming off of bed Transfers Transfers: Sit to Stand;Stand to Sit Sit to Stand: 4: Min assist Stand to Sit: 3: Mod assist Details for Transfer Assistance: Cues for technique and hand placement; physical assist to control descent' Ambulation/Gait Ambulation/Gait Assistance: 3: Mod assist Ambulation Distance (Feet): 3 Feet (pivot steps bed to chair) Assistive device: Rolling walker Ambulation/Gait Assistance Details: verbal and demo cues for gait sequence and PWB; physical assist to move RW    Exercises     PT Diagnosis: Difficulty  walking;Acute pain  PT Problem List: Decreased strength;Decreased range of motion;Decreased activity tolerance;Decreased balance;Decreased knowledge of use of DME;Decreased knowledge of precautions;Pain PT Treatment Interventions: DME instruction;Gait training;Stair training;Functional mobility training;Therapeutic activities;Therapeutic exercise;Patient/family education   PT Goals Acute Rehab PT Goals PT Goal Formulation: With patient Time For Goal Achievement: 09/10/12 Potential to Achieve Goals: Good Pt will go Supine/Side to Sit: with modified independence PT Goal: Supine/Side to Sit - Progress: Goal set today Pt will go Sit to Supine/Side: with modified independence PT Goal: Sit to Supine/Side - Progress: Goal set today Pt will go Sit to Stand: with modified independence PT Goal: Sit to Stand - Progress: Goal set today Pt will go Stand to Sit: with modified independence PT Goal: Stand to Sit - Progress: Goal set today Pt will Ambulate: 51 - 150 feet;with modified independence;with rolling walker PT Goal: Ambulate - Progress: Goal set today Pt will Go Up / Down Stairs: 3-5 stairs;with rail(s);with min assist PT Goal: Up/Down Stairs - Progress: Goal set today Pt will Perform Home Exercise Program: with supervision, verbal cues required/provided PT Goal: Perform Home Exercise Program - Progress: Goal set today  Visit Information  Last PT Received On: 09/10/12 Assistance Needed: +1    Subjective Data  Subjective: Agreeable to OOB 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   Prior Functioning  Home Living Lives With: Spouse Available Help at Discharge: Family;Available 24 hours/day Type of Home: House Home Access: Stairs to enter [employer] of Steps: 3 Entrance Stairs-Rails: Left Home Layout: Two level;Able to live on main level with bedroom/bathroom Bathroom Shower/Tub: Tub/shower unit Home Adaptive Equipment:  Shower chair with back Prior Function Level of  Independence: Independent Able to Take Stairs?: Yes Driving: Yes Vocation: Full time employment Communication Communication: No difficulties    Cognition  Cognition Arousal/Alertness: Awake/alert Behavior During Therapy: WFL for tasks assessed/performed Overall Cognitive Status: Within Functional Limits for tasks assessed    Extremity/Trunk Assessment Right Upper Extremity Assessment RUE ROM/Strength/Tone: Within functional levels Left Upper Extremity Assessment LUE ROM/Strength/Tone: Within functional levels Right Lower Extremity Assessment RLE ROM/Strength/Tone: WFL for tasks assessed Left Lower Extremity Assessment LLE ROM/Strength/Tone: Deficits;Due to pain LLE ROM/Strength/Tone Deficits: Decr AROM and strength, limited by pain postop   Balance    End of Session PT - End of Session Equipment Utilized During Treatment: Gait belt Activity Tolerance: Patient limited by pain Patient left: in chair;with call bell/phone within reach;with family/visitor present Nurse Communication: Mobility status  GP     Olen Pel [place], [place] [phone]  09/10/2012, 4:27 PM

## 2012-09-10 NOTE — Op Note (Signed)
Heather Frederick, Heather Frederick NO.:  [id number]  MEDICAL RECORD NO.:  [id number]  LOCATION:  [id number]                        FACILITY:  [facility]  PHYSICIAN:  Claude Manges. Reiley Bertagnolli, M.D.DATE OF BIRTH:  1956-03-13  DATE OF PROCEDURE:  09/10/2012 DATE OF DISCHARGE:                              OPERATIVE REPORT   PREOPERATIVE DIAGNOSES: 1. End-stage osteoarthritis, left knee. 2. Morbid obesity with body mass index of 47.  POSTOPERATIVE DIAGNOSES: 1. End-stage osteoarthritis, left knee. 2. Morbid obesity with body mass index of 47.  PROCEDURE:  Left total knee replacement.  SURGEON:  Claude Manges. Cleophas Dunker, MD  ASSISTANT:  Oris Drone. Petrarca, PA-C  ANESTHESIA:  General with supplemental left femoral nerve block.  COMPLICATIONS:  None.  COMPONENTS:  DePuy LCS standard femoral component, a #3 keeled rotating tibial tray, 10-mm polyethylene bridging bearing, a metal back rotating 3 pegged patella.  Components were secured with polymethyl methacrylate.  DESCRIPTION OF PROCEDURE:  Mr. Kaylor was met in the holding area, identified the left knee as appropriate operative site and marked it accordingly.  She received a preoperative femoral nerve block from Anesthesia.  The patient was then transported to room #7 and placed under general anesthesia without difficulty.  Nursing staff inserted a Foley catheter.  Urine was clear.  Tourniquet was then applied to the left thigh and the leg prepped with Betadine with chlorhexidine prep from the tourniquet to the tips of the toes followed by DuraPrep.  Sterile draping was performed.  With the extremity still elevated, was Esmarch exsanguinated with the proximal tourniquet at 350 mmHg.  A midline longitudinal incision was made and centered about the patella, extending from the superior pouch to the tibial tubercle.  Via sharp dissection, incision was carried down to the subcutaneous tissue.  Gross bleeders were Bovie coagulated.  There was  abundant adipose tissue that was incised with the Bovie.  First layer of capsule was incised in the midline.  A medial parapatellar incision was made with the Bovie.  The joint was entered.  There was a clear yellow joint effusion approximately 25 to 30 mL in volume.  Patella was then everted to 180 degrees laterally, knee flexed to 90 degrees.  There was a moderate amount of beefy-red synovitis.  A synovectomy was performed.  There were moderate osteophytes along the medial lateral femoral condyles.  These were removed with the rongeur.  There were some small osteophytes along the tibial tray.  There was considerable loss of articular cartilage in both the medial and lateral compartments with areas of full-thickness cartilage loss.  I then trialed a standard femoral component.  First, bony cut was made transversely in the proximal tibia using the external tibial guide.  With each bony cut, I checked my external alignment with the external jig.  Subsequent cuts were then made on the femur.  Using the standard jig, we checked flexion and extension gaps at 10 mm and were symmetrical.  Laminar spreaders were then placed in the mediolateral compartment.  I removed medial and lateral menisci ACL and PCL.  There was a large Baker cyst posteromedially which I debrided.  Popliteus tendon was also incised.  I again checked flexion  and extension gaps and perfectly symmetrical in flexion and extension.  A distal femoral bony cut was made in 4 degrees of valgus and the finishing guide was then applied with the tapering cups in the center hole.  Again I checked flexion and extension gaps at 10 mm.  Retractors were then placed around the tibia which was advanced anteriorly and measured a #3 tibial tray.  This was pinned in place.  A center hole was made followed by the keeled cut.  With a 10-mm bridging bearing in place, I applied the trial standard femoral component, and then through a  full range of motion, the knee was perfectly stable. There was no opening with varus or valgus stress.  MCL and LCL remained intact.  The patella was prepared by removing approximately 8 mm of bone, leaving 12 mm of patella thickness.  Trial patella was then inserted through a full range of motion with stable.  Trial components were removed.  Joint was copiously irrigated with saline solution.  The final components were then impacted with the DePuy HP polymethyl methacrylate.  First, we initially inserted the tibial tray followed by 10-mm bridging bearing and then the femoral component. With the knee in extension, extraneous methacrylate was removed.  The patella was applied with methacrylate and a patellar clamp.  Awaiting for the methacrylate to mature, we injected the deep capsule with 0.25% Marcaine with epinephrine.  At approximately 16 minutes, the methacrylate had hardened.  Joint was again irrigated with saline solution.  Tourniquet deflated with immediate capillary refill to the joint.  Gross bleeders were Bovie coagulated.  We covered the joint surface with thrombin spray.  A medium- size Hemovac was inserted.  The deep capsule was then closed with interrupted #1 Ethibond superficial capsule with running 0 Vicryl, subcu with 2-0 Vicryl and 3-0 Monocryl.  Skin closed with skin clips.  Sterile bulky dressing was applied followed by the patient's support stocking.  The patient tolerated the procedure well without complications.     Claude Manges. Cleophas Dunker, M.D.     PWW/MEDQ  D:  09/10/2012  T:  09/10/2012  Job:  [id number]

## 2012-09-10 NOTE — Anesthesia Preprocedure Evaluation (Addendum)
Anesthesia Evaluation  Patient identified by MRN, date of birth, ID band Patient awake    Reviewed: Allergy & Precautions, H&P , NPO status , Patient's Chart, lab work & pertinent test results  History of Anesthesia Complications (+) Family history of anesthesia reaction  Airway Mallampati: II TM Distance: >3 FB Neck ROM: Full    Dental  (+) Teeth Intact   Pulmonary          Cardiovascular     Neuro/Psych  Headaches, PSYCHIATRIC DISORDERS Anxiety    GI/Hepatic GERD-  Medicated and Controlled,  Endo/Other    Renal/GU      Musculoskeletal   Abdominal   Peds  Hematology   Anesthesia Other Findings   Reproductive/Obstetrics                           Anesthesia Physical Anesthesia Plan  ASA: III  Anesthesia Plan: General   Post-op Pain Management:    Induction: Intravenous  Airway Management Planned: Oral ETT  Additional Equipment:   Intra-op Plan:   Post-operative Plan: Extubation in OR  Informed Consent: I have reviewed the patients History and Physical, chart, labs and discussed the procedure including the risks, benefits and alternatives for the proposed anesthesia with the patient or authorized representative who has indicated his/her understanding and acceptance.   Dental advisory given  Plan Discussed with: CRNA, Anesthesiologist and Surgeon  Anesthesia Plan Comments:         Anesthesia Quick Evaluation

## 2012-09-10 NOTE — Op Note (Signed)
PATIENT ID:      Heather Frederick  MRN:     [id number] DOB/AGE:    06/27/1955 / 57 y.o.       OPERATIVE REPORT    DATE OF PROCEDURE:  09/10/2012       PREOPERATIVE DIAGNOSIS:   Left Knee Osteoarthritis-end stage                                                      BMI 47    POSTOPERATIVE DIAGNOSIS:   Left Knee Osteoarthritis -same                                                                    There is no weight on file to calculate BMI.     PROCEDURE:  Procedure(s): TOTAL KNEE ARTHROPLASTY- left left     SURGEON:  [name], MD    ASSISTANT:   Jacqualine Code, PA-C   (Present and scrubbed throughout the case, critical for assistance with exposure, retraction, instrumentation, and closure.)          ANESTHESIA: regional and general     DRAINS: (left knee) Hemovact drain(s) in the open with  Suction Open :      TOURNIQUET TIME: * Missing tourniquet times found for documented tourniquets in log:  [id number] *    COMPLICATIONS:  None   CONDITION:  stable  PROCEDURE IN DETAIL: [id number]  Heather Frederick 09/10/2012, 9:28 AM

## 2012-09-10 NOTE — Anesthesia Postprocedure Evaluation (Signed)
  Anesthesia Post-op Note  Patient: Heather Frederick  Procedure(s) Performed: Procedure(s) with comments: TOTAL KNEE ARTHROPLASTY- left (Left) - Left total knee arthroplasty  Patient Location: PACU  Anesthesia Type:General  Level of Consciousness: awake  Airway and Oxygen Therapy: Patient Spontanous Breathing  Post-op Pain: mild  Post-op Assessment: Post-op Vital signs reviewed  Post-op Vital Signs: Reviewed  Complications: No apparent anesthesia complications

## 2012-09-10 NOTE — Anesthesia Procedure Notes (Addendum)
Procedure Name: Intubation Date/Time: 09/10/2012 7:40 AM Performed by: Charm Barges, DAVID R Pre-anesthesia Checklist: Patient identified, Emergency Drugs available, Suction available, Patient being monitored and Timeout performed Patient Re-evaluated:Patient Re-evaluated prior to inductionOxygen Delivery Method: Circle system utilized Preoxygenation: Pre-oxygenation with 100% oxygen Intubation Type: IV induction Ventilation: Mask ventilation without difficulty Laryngoscope Size: Mac and 3 Grade View: Grade III Tube type: Oral Tube size: 7.0 mm Number of attempts: 1 Airway Equipment and Method: Stylet Placement Confirmation: ETT inserted through vocal cords under direct vision,  positive ETCO2 and breath sounds checked- equal and bilateral Secured at: 21 cm Tube secured with: Tape Dental Injury: Teeth and Oropharynx as per pre-operative assessment    Anesthesia Regional Block:  Femoral nerve block  Pre-Anesthetic Checklist: ,, timeout performed, Correct Patient, Correct Site, Correct Laterality, Correct Procedure, Correct Position, site marked, Risks and benefits discussed,  Surgical consent,  Pre-op evaluation,  At surgeon's request and post-op pain management  Laterality: Left     Needles:   Needle Type: Stimulator Needle - 80          Additional Needles:  Procedures: Doppler guided and nerve stimulator Femoral nerve block  Nerve Stimulator or Paresthesia:  Response: 0.5 mA,   Additional Responses:   Narrative:  Start time: 09/10/2012 7:05 AM End time: 09/10/2012 7:20 AM Injection made incrementally with aspirations every 5 mL.  Performed by: Personally  Anesthesiologist: Dr. Randa Evens

## 2012-09-10 NOTE — Preoperative (Signed)
Beta Blockers   Reason not to administer Beta Blockers:Not Applicable 

## 2012-09-10 NOTE — H&P (Signed)
  The recent History & Physical has been reviewed. I have personally examined the patient today. There is no interval change to the documented History & Physical. The patient would like to proceed with the procedure.  [name] W 09/10/2012,  7:24 AM

## 2012-09-10 NOTE — Transfer of Care (Signed)
Immediate Anesthesia Transfer of Care Note  Patient: Heather Frederick  Procedure(s) Performed: Procedure(s) with comments: TOTAL KNEE ARTHROPLASTY- left (Left) - Left total knee arthroplasty  Patient Location: PACU  Anesthesia Type:GA combined with regional for post-op pain  Level of Consciousness: alert  and oriented  Airway & Oxygen Therapy: Patient Spontanous Breathing and Patient connected to nasal cannula oxygen  Post-op Assessment: Report given to PACU RN, Post -op Vital signs reviewed and stable and Patient moving all extremities  Post vital signs: Reviewed and stable  Complications: No apparent anesthesia complications

## 2012-09-11 LAB — URINALYSIS, ROUTINE W REFLEX MICROSCOPIC
Nitrite: NEGATIVE
Specific Gravity, Urine: 1.008 (ref 1.005–1.030)
pH: 6.5 (ref 5.0–8.0)

## 2012-09-11 LAB — BASIC METABOLIC PANEL
BUN: 8 mg/dL (ref 6–23)
CO2: 31 mEq/L (ref 19–32)
Chloride: 99 mEq/L (ref 96–112)
Creatinine, Ser: 0.83 mg/dL (ref 0.50–1.10)
Glucose, Bld: 163 mg/dL — ABNORMAL HIGH (ref 70–99)

## 2012-09-11 LAB — CBC
HCT: 39.2 % (ref 36.0–46.0)
Hemoglobin: 13 g/dL (ref 12.0–15.0)
MCH: 29.3 pg (ref 26.0–34.0)
MCV: 88.5 fL (ref 78.0–100.0)
RBC: 4.43 MIL/uL (ref 3.87–5.11)
WBC: 9.5 10*3/uL (ref 4.0–10.5)

## 2012-09-11 LAB — URINE MICROSCOPIC-ADD ON

## 2012-09-11 NOTE — Plan of Care (Signed)
Problem: Phase II Progression Outcomes Goal: Discharge plan established Recommend HH OT for ADL trg and ADL mobility safety trg after acute care d/c     

## 2012-09-11 NOTE — Care Management Note (Signed)
    Page 1 of 2   09/12/2012     12:05:34 PM   CARE MANAGEMENT NOTE 09/12/2012  Patient:  Heather Frederick,Heather Frederick   Account Number:  [id number]  Date Initiated:  09/11/2012  Documentation initiated by:  Jacquelynn Cree  Subjective/Objective Assessment:   admitted postop total knee  lives with spouse     Action/Plan:   PT/OT evals-recommended HHPT and HHOT   Anticipated DC Date:  09/13/2012   Anticipated DC Plan:  HOME W HOME HEALTH SERVICES      DC Planning Services  CM consult      Choice offered to / List presented to:  C-1 Patient   DME arranged  [facility]      DME agency  [employer]     HH arranged  HH-2 PT  HH-3 OT      [facility] agency  [facility]   Status of service:  Completed, signed off Medicare Important Message given?   (If response is "NO", the following Medicare IM given date fields will be blank) Date Medicare IM given:   Date Additional Medicare IM given:    Discharge Disposition:  HOME W HOME HEALTH SERVICES  Per UR Regulation:  Reviewed for med. necessity/level of care/duration of stay  If discussed at Long Length of Stay Meetings, dates discussed:    Comments:  09/11/12 Patient set up preoperatively with Genevieve Norlander for [employer] and [employer] for equipment. Patient states that she has a 3N1 but will need walker and shower chair. Lives with spouse. Jacquelynn Cree RN, BSN, CCM

## 2012-09-11 NOTE — Evaluation (Signed)
Occupational Therapy Evaluation Patient Details Name: Heather Frederick MRN: [id number] DOB: [birth year] Today's Date: 09/11/2012 Time: 4098-1191 OT Time Calculation (min): 20 min  OT Assessment / Plan / Recommendation Clinical Impression  Pt demos decline in function with ADLs and ADL mobility following L TKA. Pt would benefit from acute OT services to address impairments to help restore PLOF to return home safely    OT Assessment  Patient needs continued OT Services    Follow Up Recommendations  Home health OT    Barriers to Discharge None    Equipment Recommendations  3 in 1 bedside comode    Recommendations for Other Services    Frequency  Min 2X/week    Precautions / Restrictions Precautions Precautions: Knee Restrictions Weight Bearing Restrictions: Yes LLE Weight Bearing: Partial weight bearing LLE Partial Weight Bearing Percentage or Pounds: 50%   Pertinent Vitals/Pain 6/10 L LE    ADL  Grooming: Performed;Wash/dry hands;Wash/dry face;Min guard;Set up Where Assessed - Grooming: Supported standing Upper Body Bathing: Simulated;Supervision/safety;Set up Lower Body Bathing: Simulated;Maximal assistance Upper Body Dressing: Performed;Supervision/safety;Set up Lower Body Dressing: Performed;Maximal assistance Toilet Transfer: Performed;Minimal assistance;Min guard Toilet Transfer Method: Sit to stand Toilet Transfer Equipment: Raised toilet seat with arms (or 3-in-1 over toilet);Grab bars Toileting - Clothing Manipulation and Hygiene: Performed;Minimal assistance Where Assessed - Glass blower/designer Manipulation and Hygiene: Standing Equipment Used: Long-handled shoe horn;Long-handled sponge;Reacher;Rolling walker;Gait belt;Sock aid Transfers/Ambulation Related to ADLs: cues for correct hand placement    OT Diagnosis: Generalized weakness;Acute pain  OT Problem List: Decreased strength;Decreased knowledge of use of DME or AE;Decreased safety awareness;Pain;Decreased  activity tolerance OT Treatment Interventions: Self-care/ADL training;Balance training;Therapeutic exercise;Neuromuscular education;Therapeutic activities;DME and/or AE instruction;Patient/family education   OT Goals Acute Rehab OT Goals OT Goal Formulation: With patient Time For Goal Achievement: 09/18/12 Potential to Achieve Goals: Good ADL Goals Pt Will Perform Grooming: with set-up;with supervision ADL Goal: Grooming - Progress: Goal set today Pt Will Perform Lower Body Bathing: with min assist;with mod assist;with adaptive equipment ADL Goal: Lower Body Bathing - Progress: Goal set today Pt Will Perform Lower Body Dressing: with min assist;with mod assist;with adaptive equipment ADL Goal: Lower Body Dressing - Progress: Goal set today Pt Will Perform Toileting - Clothing Manipulation: with supervision;Standing;Sitting on 3-in-1 or toilet ADL Goal: Toileting - Clothing Manipulation - Progress: Goal set today Pt Will Perform Toileting - Hygiene: with supervision;Standing at 3-in-1/toilet;Sitting on 3-in-1 or toilet ADL Goal: Toileting - Hygiene - Progress: Goal set today Pt Will Perform Tub/Shower Transfer: with supervision;with min assist;with DME;Grab bars ADL Goal: Tub/Shower Transfer - Progress: Goal set today  Visit Information  Last OT Received On: 09/11/12 Assistance Needed: +1    Subjective Data  Subjective: " I had a migraine earlier " Patient Stated Goal: To return home   Prior Functioning     Home Living Lives With: Spouse Available Help at Discharge: Family;Available 24 hours/day Type of Home: House Home Access: Stairs to enter [employer] of Steps: 3 Home Layout: Two level;Able to live on main level with bedroom/bathroom Bathroom Shower/Tub: Tub/shower unit Home Adaptive Equipment: Shower chair with back Prior Function Level of Independence: Independent Able to Take Stairs?: Yes Driving: Yes Vocation: Full time  employment Communication Communication: No difficulties Dominant Hand: Right         Vision/Perception Vision - History Baseline Vision: Wears glasses all the time Patient Visual Report: No change from baseline Perception Perception: Within Functional Limits   Cognition  Cognition Arousal/Alertness: Awake/alert Behavior During Therapy: WFL for tasks assessed/performed  Overall Cognitive Status: Within Functional Limits for tasks assessed    Extremity/Trunk Assessment Right Upper Extremity Assessment RUE ROM/Strength/Tone: WFL for tasks assessed RUE Sensation: WFL - Light Touch RUE Coordination: WFL - gross/fine motor Left Upper Extremity Assessment LUE ROM/Strength/Tone: WFL for tasks assessed LUE Sensation: WFL - Light Touch LUE Coordination: WFL - gross/fine motor     Mobility Bed Mobility Bed Mobility: Not assessed Supine to Sit: 4: Min assist;With rails Sitting - Scoot to Edge of Bed: 4: Min assist;With rail Details for Bed Mobility Assistance: (A) to advance L LE and control descent of L LE to floor; verbal cues for hand placement and sequencing Transfers Sit to Stand: From bed;From elevated surface;4: Min guard Stand to Sit: To chair/3-in-1;With armrests;With upper extremity assist;4: Min guard Details for Transfer Assistance: Cues for safe hand placement and for technique        Balance Balance Balance Assessed: No   End of Session OT - End of Session Equipment Utilized During Treatment: Gait belt;Other (comment) (RW, 3 in 1) Activity Tolerance: Patient limited by pain Patient left: in bed;with call bell/phone within reach  GO     Galen Manila 09/11/2012, 2:52 PM

## 2012-09-11 NOTE — Progress Notes (Signed)
Physical Therapy Treatment Patient Details Name: Heather Frederick MRN: [id number] DOB: 1956-02-27 Today's Date: 09/11/2012 Time: 4098-1191 PT Time Calculation (min): 28 min  PT Assessment / Plan / Recommendation Comments on Treatment Session  Pt limited in therapy session today due to migraine. Plan to f/u with pt to increase mobility in next session in order to progress to D/C home with husband. Pt encouraged to perform HEP while in chair this morning.     Follow Up Recommendations  Home health PT;Supervision/Assistance - 24 hour     Does the patient have the potential to tolerate intense rehabilitation     Barriers to Discharge        Equipment Recommendations  Rolling walker with 5" wheels    Recommendations for Other Services    Frequency 7X/week   Plan Discharge plan remains appropriate;Frequency remains appropriate    Precautions / Restrictions Precautions Precautions: Knee Precaution Comments: Educated pt and husband re: knee prec Restrictions Weight Bearing Restrictions: Yes LLE Weight Bearing: Partial weight bearing LLE Partial Weight Bearing Percentage or Pounds: 50   Pertinent Vitals/Pain 6/10 primarily due to migraine. Pt premedicated.     Mobility  Bed Mobility Bed Mobility: Supine to Sit;Sitting - Scoot to Edge of Bed Supine to Sit: 4: Min assist;With rails Sitting - Scoot to [place] of Bed: 4: Min assist;With rail Details for Bed Mobility Assistance: (A) to advance L LE and control descent of L LE to floor; verbal cues for hand placement and sequencing Transfers Transfers: Sit to Stand;Stand to Sit;Stand Pivot Transfers Sit to Stand: 4: Min assist;From bed;From elevated surface Stand to Sit: 4: Min assist;To chair/3-in-1;With armrests;With upper extremity assist Stand Pivot Transfers: 4: Min assist;From elevated surface Details for Transfer Assistance: pt performed SPT from bed to bedside commode; required verbal cues for hand placement and safety with RW; pt  required elevated surface and increased time due to pain  Ambulation/Gait Ambulation/Gait Assistance: 4: Min assist Ambulation Distance (Feet): 6 Feet Assistive device: Rolling walker Ambulation/Gait Assistance Details: verbal cues for gt sequencing and to maintain PWB status on LE LE; required min (A) to manage RW  Gait Pattern: Step-to pattern;Decreased stance time - left;Decreased step length - right;Wide base of support;Trunk flexed Gait velocity: decreased due to pain Stairs: No Wheelchair Mobility Wheelchair Mobility: No    Exercises Total Joint Exercises Ankle Circles/Pumps: AROM;Both;10 reps;Supine Quad Sets: AROM;Left;10 reps;Seated Straight Leg Raises: AAROM;5 reps;Left;Supine Long Arc Quad: AAROM;Left;10 reps;Seated Goniometric ROM: -4 to 70 degrees AROM in sitting; limited by pain   PT Diagnosis:    PT Problem List:   PT Treatment Interventions:     PT Goals Acute Rehab PT Goals PT Goal Formulation: With patient Time For Goal Achievement: 09/18/12 Potential to Achieve Goals: Good PT Goal: Supine/Side to Sit - Progress: Progressing toward goal PT Goal: Sit to Stand - Progress: Progressing toward goal PT Goal: Stand to Sit - Progress: Progressing toward goal PT Goal: Ambulate - Progress: Progressing toward goal PT Goal: Perform Home Exercise Program - Progress: Progressing toward goal  Visit Information  Last PT Received On: 09/11/12 Assistance Needed: +1    Subjective Data  Subjective: pt lying supine; c/o migraine; requests to use bedside commode 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   Cognition  Cognition Arousal/Alertness: Awake/alert Behavior During Therapy: WFL for tasks assessed/performed Overall Cognitive Status: Within Functional Limits for tasks assessed    Balance  Balance Balance Assessed: Yes Static Sitting Balance Static Sitting - Balance Support: No upper extremity  supported;Feet supported Static Sitting - Level of Assistance: 5:  Stand by assistance  End of Session PT - End of Session Equipment Utilized During Treatment: Gait belt Activity Tolerance: Patient limited by pain Patient left: in chair;with call bell/phone within reach Nurse Communication: Mobility status   GP     Donell Sievert, [place] [phone] 09/11/2012, 9:50 AM

## 2012-09-11 NOTE — Progress Notes (Signed)
Physical Therapy Treatment Patient Details Name: Heather Frederick MRN: [id number] DOB: 11-Dec-1955 Today's Date: 09/11/2012 Time: 5784-6962 PT Time Calculation (min): 19 min  PT Assessment / Plan / Recommendation Comments on Treatment Session  Patient able to progress more this afternoon. Continue with current POC    Follow Up Recommendations  Home health PT;Supervision/Assistance - 24 hour     Does the patient have the potential to tolerate intense rehabilitation     Barriers to Discharge        Equipment Recommendations  Rolling walker with 5" wheels    Recommendations for Other Services    Frequency 7X/week   Plan Discharge plan remains appropriate;Frequency remains appropriate    Precautions / Restrictions Precautions Precautions: Knee Restrictions LLE Weight Bearing: Partial weight bearing LLE Partial Weight Bearing Percentage or Pounds: 50   Pertinent Vitals/Pain     Mobility  Bed Mobility Supine to Sit: 4: Min assist;With rails Sitting - Scoot to Edge of Bed: 4: Min assist;With rail Details for Bed Mobility Assistance: (A) to advance L LE and control descent of L LE to floor; verbal cues for hand placement and sequencing Transfers Sit to Stand: From bed;From elevated surface;4: Min guard Stand to Sit: To chair/3-in-1;With armrests;With upper extremity assist;4: Min guard Details for Transfer Assistance: Cues for safe hand placement and for technique Ambulation/Gait Ambulation/Gait Assistance: 4: Min guard Ambulation Distance (Feet): 40 Feet Assistive device: Rolling walker Ambulation/Gait Assistance Details: Cues for posture and to ensure WBing status Gait Pattern: Step-to pattern;Decreased stance time - left;Decreased step length - right;Wide base of support;Trunk flexed Gait velocity: decreased due to pain    Exercises Total Joint Exercises Quad Sets: AROM;Left;10 reps;Seated Heel Slides: AAROM;Left;5 reps   PT Diagnosis:    PT Problem List:   PT  Treatment Interventions:     PT Goals Acute Rehab PT Goals PT Goal: Supine/Side to Sit - Progress: Progressing toward goal PT Goal: Sit to Supine/Side - Progress: Progressing toward goal PT Goal: Sit to Stand - Progress: Progressing toward goal PT Goal: Stand to Sit - Progress: Progressing toward goal PT Goal: Ambulate - Progress: Progressing toward goal PT Goal: Perform Home Exercise Program - Progress: Progressing toward goal  Visit Information  Last PT Received On: 09/11/12 Assistance Needed: +1    Subjective Data      Cognition  Cognition Arousal/Alertness: Awake/alert Behavior During Therapy: WFL for tasks assessed/performed Overall Cognitive Status: Within Functional Limits for tasks assessed    Balance     End of Session PT - End of Session Equipment Utilized During Treatment: Gait belt Activity Tolerance: Patient limited by pain;Patient tolerated treatment well Patient left: in chair;with call bell/phone within reach Nurse Communication: Mobility status   GP     [name] 09/11/2012, 2:39 PM 09/11/2012 [name] PTA [phone] pager [phone] office

## 2012-09-11 NOTE — Progress Notes (Signed)
Patient ID: Heather Frederick, female   DOB: 1955/12/17, 57 y.o.   MRN: [id number] PATIENT ID: Heather Frederick        MRN:  [id number]          DOB/AGE: 05/31/1955 / 57 y.o.    [name], MD   Jacqualine Code, PA-C [address] [place], [place]  [address]                             [phone]   PROGRESS NOTE  Subjective:  negative for Chest Pain  negative for Shortness of Breath  negative for Nausea/Vomiting   negative for Calf Pain    Tolerating Diet: yes         Patient reports pain as moderate.  Has a migraine since yesterday.     Objective: Vital signs in last 24 hours:   Patient Vitals for the past 24 hrs:  BP Temp Temp src Pulse Resp SpO2 Height Weight  09/11/12 0500 125/45 mmHg 98.7 F (37.1 C) - 105 16 96 % - -  09/11/12 0400 - - - - 17 97 % - -  09/11/12 0000 - - - - 16 98 % - -  09/10/12 2058 131/73 mmHg - - 106 18 96 % - -  09/10/12 2000 - - - - 18 100 % - -  09/10/12 1329 148/61 mmHg 97.6 F (36.4 C) - 101 18 95 % - -  09/10/12 1200 126/68 mmHg 97.6 F (36.4 C) Oral 82 18 96 % - -  09/10/12 1104 156/62 mmHg - - 91 18 97 % 5\' 4"  (1.626 m) 120.339 kg (265 lb 4.8 oz)  09/10/12 1100 - 97.5 F (36.4 C) - 105 11 97 % - -  09/10/12 1048 153/83 mmHg - - 97 14 98 % - -  09/10/12 1045 155/98 mmHg - - 91 14 99 % - -  09/10/12 1034 151/85 mmHg - - 97 14 99 % - -  09/10/12 1030 151/85 mmHg - - 103 10 96 % - -  09/10/12 1017 160/84 mmHg - - 101 15 99 % - -  09/10/12 1015 152/101 mmHg - - 102 15 98 % - -  09/10/12 1007 - - - 106 11 94 % - -  09/10/12 1006 - - - 109 15 95 % - -  09/10/12 1005 165/79 mmHg - - 109 11 94 % - -  09/10/12 1000 165/79 mmHg 97.8 F (36.6 C) - 110 19 98 % - -      Intake/Output from previous day:   06/17 0701 - 06/18 0700 In: 1380 [I.V.:1200] Out: 1650 [Urine:1275; Drains:375]   Intake/Output this shift:       Intake/Output     06/17 0701 - 06/18 0700 06/18 0701 - 06/19 0700   I.V. (mL/kg) 1200 (10)    Other 180    Total  Intake(mL/kg) 1380 (11.5)    Urine (mL/kg/hr) 1275 (0.4)    Drains 375 (0.1)    Total Output 1650     Net -270             LABORATORY DATA:  Recent Labs  09/10/12 1204 09/11/12 0515  WBC 8.5 9.5  HGB 13.3 13.0  HCT 39.2 39.2  PLT 153 152    Recent Labs  09/10/12 1204 09/11/12 0515  NA 141 135  K 3.8 4.1  CL 104 99  CO2 28 31  BUN 15 8  CREATININE 1.03 0.83  GLUCOSE 118* 163*  CALCIUM 8.7 8.6   Lab Results  Component Value Date   INR 1.08 09/10/2012   INR 1.01 08/28/2012    Recent Radiographic Studies :   Chest 2 View  09/10/2012   *RADIOLOGY REPORT*  Clinical Data: Preop for knee surgery.  CHEST - 2 VIEW  Comparison: 05/21/2012.  Findings: The cardiac silhouette, mediastinal and hilar contours are within normal limits and stable.  The lungs are clear.  No pleural effusion.  The bony thorax is intact.  IMPRESSION: No acute cardiopulmonary findings.   Original Report Authenticated By: Rudie Meyer, M.D.     Examination:  General appearance: alert, cooperative and moderate distress Resp: clear to auscultation bilaterally Cardio: regular rate and rhythm GI: normal findings: bowel sounds normal  Wound Exam: clean, dry, intact dressing  Drainage:  None: wound tissue dry  Motor Exam: EHL, FHL, Anterior Tibial and Posterior Tibial Intact  Sensory Exam: Superficial Peroneal, Deep Peroneal and Tibial normal  Vascular Exam: Left dorsalis pedis artery has 1+ (weak) pulse  Assessment:    1 Day Post-Op  Procedure(s) (LRB): TOTAL KNEE ARTHROPLASTY- left (Left)  ADDITIONAL DIAGNOSIS:  Active Problems:   * No active hospital problems. *  Migraine   Plan: Physical Therapy as ordered 50% WB  DVT Prophylaxis:  Xarelto, Foot Pumps and TED hose  DISCHARGE PLAN: Home  DISCHARGE NEEDS: HHPT, CPM, Walker and 3-in-1 comode seat  Relpax ordered for migraine PT as ordered          [facility] 09/11/2012 8:11 AM

## 2012-09-12 ENCOUNTER — Encounter (HOSPITAL_COMMUNITY): Payer: Self-pay | Admitting: Orthopaedic Surgery

## 2012-09-12 DIAGNOSIS — G43909 Migraine, unspecified, not intractable, without status migrainosus: Secondary | ICD-10-CM | POA: Diagnosis present

## 2012-09-12 DIAGNOSIS — M1712 Unilateral primary osteoarthritis, left knee: Secondary | ICD-10-CM | POA: Diagnosis present

## 2012-09-12 LAB — BASIC METABOLIC PANEL
BUN: 7 mg/dL (ref 6–23)
CO2: 31 mEq/L (ref 19–32)
Chloride: 101 mEq/L (ref 96–112)
GFR calc Af Amer: 88 mL/min — ABNORMAL LOW (ref >90)
Potassium: 3.9 mEq/L (ref 3.5–5.1)

## 2012-09-12 LAB — CBC
HCT: 34.4 % — ABNORMAL LOW (ref 36.0–46.0)
Hemoglobin: 11.5 g/dL — ABNORMAL LOW (ref 12.0–15.0)
RBC: 3.85 MIL/uL — ABNORMAL LOW (ref 3.87–5.11)
RDW: 13.6 % (ref 11.5–15.5)
WBC: 8.5 10*3/uL (ref 4.0–10.5)

## 2012-09-12 LAB — URINE CULTURE

## 2012-09-12 MED ORDER — RIVAROXABAN 10 MG PO TABS: 10.0000 mg | ORAL_TABLET | ORAL | Status: DC

## 2012-09-12 MED ORDER — METHOCARBAMOL 500 MG PO TABS: 500.0000 mg | ORAL_TABLET | Freq: Four times a day (QID) | ORAL | Status: DC | PRN

## 2012-09-12 MED ORDER — HYDROMORPHONE HCL 2 MG PO TABS
2.0000 mg | ORAL_TABLET | ORAL | Status: DC | PRN
  Administered 2012-09-12: 2 mg via ORAL
  Filled 2012-09-12: qty 1

## 2012-09-12 MED ORDER — HYDROMORPHONE HCL 2 MG PO TABS: 2.0000 mg | ORAL_TABLET | ORAL | Status: DC | PRN

## 2012-09-12 NOTE — Progress Notes (Signed)
Pt discharged to home accompanied by husband. Discharge instructions and rx given and explained and pt stated understanding. Pts IV was removed prior to discharge. Pt left unit in a stable condition via wheelchair. 

## 2012-09-12 NOTE — Progress Notes (Signed)
Physical Therapy Treatment Patient Details Name: Heather Frederick MRN: [id number] DOB: March 20, 1956 Today's Date: 09/12/2012 Time: 4098-1191 PT Time Calculation (min): 30 min  PT Assessment / Plan / Recommendation Comments on Treatment Session  Patient able to tolerate stair training this AM. Ambulation limited by nausea. Patient unsure about how she feel regaurding leaving today. RN made aware    Follow Up Recommendations  Home health PT;Supervision/Assistance - 24 hour     Does the patient have the potential to tolerate intense rehabilitation     Barriers to Discharge        Equipment Recommendations  Rolling walker with 5" wheels    Recommendations for Other Services    Frequency 7X/week   Plan Discharge plan remains appropriate;Frequency remains appropriate    Precautions / Restrictions Precautions Precautions: Knee Restrictions LLE Weight Bearing: Partial weight bearing LLE Partial Weight Bearing Percentage or Pounds: 50   Pertinent Vitals/Pain Patient with complaints of nausea. RN aware    Mobility  Bed Mobility Supine to Sit: 5: Supervision;With rails Sitting - Scoot to Edge of Bed: 5: Supervision Transfers Sit to Stand: 4: Min guard;With upper extremity assist;From bed;From chair/3-in-1 Stand to Sit: 4: Min guard;With upper extremity assist;To chair/3-in-1 Details for Transfer Assistance: Cues for safe hand placement and for technique Ambulation/Gait Ambulation/Gait Assistance: 4: Min guard Ambulation Distance (Feet): 60 Feet Assistive device: Rolling walker Ambulation/Gait Assistance Details: Cues for posture and to heel strike Gait Pattern: Step-to pattern;Trunk flexed Gait velocity: decreased Stairs: Yes Stairs Assistance: 4: Min assist Stairs Assistance Details (indicate cue type and reason): A with RW and cues for technique. Handout provoded Stair Management Technique: Backwards;With walker Number of Stairs: 4    Exercises Total Joint Exercises Quad  Sets: AROM;Left;10 reps;Seated Heel Slides: AAROM;Left;10 reps Hip ABduction/ADduction: AAROM;Left;10 reps Straight Leg Raises: AAROM;Left;10 reps   PT Diagnosis:    PT Problem List:   PT Treatment Interventions:     PT Goals Acute Rehab PT Goals PT Goal: Supine/Side to Sit - Progress: Progressing toward goal PT Goal: Sit to Stand - Progress: Progressing toward goal PT Goal: Stand to Sit - Progress: Progressing toward goal PT Goal: Ambulate - Progress: Progressing toward goal PT Goal: Up/Down Stairs - Progress: Progressing toward goal PT Goal: Perform Home Exercise Program - Progress: Progressing toward goal  Visit Information  Last PT Received On: 09/12/12 Assistance Needed: +1    Subjective Data      Cognition  Cognition Arousal/Alertness: Awake/alert Behavior During Therapy: WFL for tasks assessed/performed Overall Cognitive Status: Within Functional Limits for tasks assessed    Balance     End of Session PT - End of Session Equipment Utilized During Treatment: Gait belt Activity Tolerance: Patient tolerated treatment well Patient left: in chair;with call bell/phone within reach   GP     Heather Frederick 09/12/2012, 8:42 AM  09/12/2012 Heather Frederick PTA [phone] pager [phone] office

## 2012-09-12 NOTE — Discharge Summary (Signed)
Heather Campbell, MD   Jacqualine Code, PA-C [address] [place], [place]  [address]                             [phone]  PATIENT ID: Heather Frederick        MRN:  [id number]          DOB/AGE: 55-02-1956 / 57 y.o.    DISCHARGE SUMMARY  ADMISSION DATE:    09/10/2012 DISCHARGE DATE:   09/12/2012   ADMISSION DIAGNOSIS: Left Knee Osteoarthritis    DISCHARGE DIAGNOSIS:  Left Knee Osteoarthritis    ADDITIONAL DIAGNOSIS: Principal Problem:   Osteoarthritis of left knee Active Problems:   Migraines   Severe obesity (BMI >= 40)  Past Medical History  Diagnosis Date  . Family history of anesthesia complication     father has a severe hard time waking up  . Anxiety   . Mental disorder   . Chronic kidney disease     UTI'S  . GERD (gastroesophageal reflux disease)   . Headache(784.0)   . Arthritis   . Hard of hearing     PROCEDURE: Procedure(s): TOTAL KNEE ARTHROPLASTY- left Lefton 09/10/2012  CONSULTS: none     HISTORY: Heather Frederick is a very pleasant 57 year old white female who is seen today for evaluation of her left knee. She has had problems with pain in both knees for many years. She has had multiple cortisone injections to the knee in the past. She has also had arthroscopic debridement of the left knee in December 2012, which included that of chondroplasty of the medial femoral condyle and patellofemoral joint with plical band excision. She has continued to have increasing pain and discomfort in the knee to the point now where she is having pain almost with every step. She is having symptoms of giving way, popping and occasional catching without true locking though. Difficulty with her activities of daily living including that of stairs. She states that her pain is moderately severe and more of an aching, throbbing pain with occasional sharpness to it. She has tried all types of medications as well as cortisone and viscosupplementation and now has started having minimal if any  benefit from everything. She is allergic to anti-inflammatories, or at least she cannot tolerate them, which is unfortunate, because she does not have that modality to help with her symptoms   HOSPITAL COURSE:  Heather Frederick is a 57 y.o. admitted on 09/10/2012 and found to have a diagnosis of Left Knee Osteoarthritis.  After appropriate laboratory studies were obtained  they were taken to the operating room on 09/10/2012 and underwent  Procedure(s): TOTAL KNEE ARTHROPLASTY- left Left.   They were given perioperative antibiotics:  Anti-infectives   Start     Dose/Rate Route Frequency Ordered Stop   09/10/12 1300  ceFAZolin (ANCEF) IVPB 2 g/50 mL premix     2 g 100 mL/hr over 30 Minutes Intravenous Every 6 hours 09/10/12 1138 09/10/12 2221   09/10/12 1215  ceFAZolin (ANCEF) 3 g in dextrose 5 % 50 mL IVPB  Status:  Discontinued     3 g 160 mL/hr over 30 Minutes Intravenous On call to O.R. 09/10/12 1138 09/10/12 1203   09/10/12 0600  ceFAZolin (ANCEF) 3 g in dextrose 5 % 50 mL IVPB     3 g 160 mL/hr over 30 Minutes Intravenous On call to O.R. 09/09/12 1407 09/10/12 0731    .  Tolerated the  procedure well.  Placed with a foley intraoperatively.    POD #1, allowed out of bed to a chair.  PT for ambulation and exercise program.  Foley D/C'd in morning.  IV saline locked.  O2 discontionued. Had significant migraine post op  POD #2, continued PT and ambulation.   Hemovac pulled. Migraine improved. The remainder of the hospital course was dedicated to ambulation and strengthening.   The patient was discharged on 2 Days Post-Op in  Stable condition.  Blood products given:none  DIAGNOSTIC STUDIES: Recent vital signs: Patient Vitals for the past 24 hrs:  BP Temp Pulse Resp SpO2  09/12/12 0554 99/51 mmHg 97.8 F (36.6 C) 91 16 93 %  09/12/12 0315 - 102.1 F (38.9 C) - - -  09/11/12 2051 133/48 mmHg 98.8 F (37.1 C) 112 16 93 %  09/11/12 1500 134/57 mmHg 99.7 F (37.6 C) 115 16 92 %        Recent laboratory studies:  Recent Labs  09/10/12 1204 09/11/12 0515 09/12/12 0435  WBC 8.5 9.5 8.5  HGB 13.3 13.0 11.5*  HCT 39.2 39.2 34.4*  PLT 153 152 134*    Recent Labs  09/10/12 1204 09/11/12 0515 09/12/12 0435  NA 141 135 137  K 3.8 4.1 3.9  CL 104 99 101  CO2 28 31 31   BUN 15 8 7   CREATININE 1.03 0.83 0.85  GLUCOSE 118* 163* 152*  CALCIUM 8.7 8.6 8.6   Lab Results  Component Value Date   INR 1.08 09/10/2012   INR 1.01 08/28/2012     Recent Radiographic Studies :   Chest 2 View  09/10/2012   *RADIOLOGY REPORT*  Clinical Data: Preop for knee surgery.  CHEST - 2 VIEW  Comparison: 05/21/2012.  Findings: The cardiac silhouette, mediastinal and hilar contours are within normal limits and stable.  The lungs are clear.  No pleural effusion.  The bony thorax is intact.  IMPRESSION: No acute cardiopulmonary findings.   Original Report Authenticated By: Rudie Meyer, M.D.    DISCHARGE INSTRUCTIONS: Discharge Orders   Future Appointments Provider Department Dept Phone   10/02/2012 8:00 AM York Spaniel, MD [facility] [phone]   Future Orders Complete By Expires     CPM  As directed     Comments:      Continuous passive motion machine (CPM):      Use the CPM from 0 to 60 for 6-8 hours per day.      You may increase by 5-10 per day.  You may break it up into 2 or 3 sessions per day.      Use CPM for 3-4 weeks or until you are told to stop.    Call MD / Call 911  As directed     Comments:      If you experience chest pain or shortness of breath, CALL 911 and be transported to the hospital emergency room.  If you develope a fever above 101 F, pus (white drainage) or increased drainage or redness at the wound, or calf pain, call your surgeon's office.    Change dressing  As directed     Comments:      Change dressing on SUNDAY, then change the dressing daily with sterile 4 x 4 inch gauze dressing and apply TED hose.  You may clean the incision  with alcohol prior to redressing.    Constipation Prevention  As directed     Comments:      Drink plenty  of fluids.  Prune juice and/or coffee may be helpful.  You may use a stool softener, such as Colace (over the counter) 100 mg twice a day.  Use MiraLax (over the counter) for constipation as needed but this may take several days to work.  Mag Citrate --OR-- Milk of Magnesia may also be used but follow directions on the label.    Diet general  As directed     Do not put a pillow under the knee. Place it under the heel.  As directed     Driving restrictions  As directed     Comments:      No driving for 6 weeks    Increase activity slowly as tolerated  As directed     Lifting restrictions  As directed     Comments:      No lifting for 6 weeks    Partial weight bearing  As directed     Comments:      50 % WEIGHT BEARING AS TAUGHT IN PHYSICAL THERAPY    Patient may shower  As directed     Comments:      You may shower over the brown dressing.  Once the dressing is removed you may shower without a dressing once there is no drainage.  Do not wash over the wound.  If drainage remains, cover wound with plastic wrap and then shower.    TED hose  As directed     Comments:      Use stockings (TED hose) for 2 weeks on operative leg(s).  You may remove them at night for sleeping.       DISCHARGE MEDICATIONS:     Medication List    STOP taking these medications       TRIPLE FLEX 750-400-375 MG Tabs  Generic drug:  Glucosamine-Chondroitin-MSM      TAKE these medications       acetaminophen 500 MG tablet  Commonly known as:  TYLENOL  Take 1,000 mg by mouth 2 (two) times daily as needed for pain.     citalopram 20 MG tablet  Commonly known as:  CELEXA  Take 20 mg by mouth every evening.     eletriptan 40 MG tablet  Commonly known as:  RELPAX  One tablet by mouth at onset of headache. May repeat in 2 hours if headache persists or recurs.     FISH OIL TRIPLE STRENGTH 1400 MG Caps   Take 1,400 mg by mouth daily.     frovatriptan 2.5 MG tablet  Commonly known as:  FROVA  Take 2.5 mg by mouth 2 (two) times daily as needed for migraine.     HM CRANBERRY SUPER STRENGTH 300 MG tablet  Generic drug:  Cranberry  Take 300 mg by mouth 2 (two) times daily.     HYDROmorphone 2 MG tablet  Commonly known as:  DILAUDID  Take 1 tablet (2 mg total) by mouth every 4 (four) hours as needed for pain (Take 1-2 tabes every 4 hours as needed for pain).     loratadine 10 MG tablet  Commonly known as:  CLARITIN  Take 20 mg by mouth 2 (two) times daily as needed for allergies.     Lutein-Zeaxanthin 25-5 MG Caps  Take 1 capsule by mouth daily.     methocarbamol 500 MG tablet  Commonly known as:  ROBAXIN  Take 1 tablet (500 mg total) by mouth every 6 (six) hours as needed.     multivitamin with minerals  Tabs  Take 1 tablet by mouth daily.     nystatin cream  Commonly known as:  MYCOSTATIN  Apply 1 application topically daily as needed (for itching).     pantoprazole 40 MG tablet  Commonly known as:  PROTONIX  Take 40 mg by mouth every evening.     pregabalin 200 MG capsule  Commonly known as:  LYRICA  Take 200 mg by mouth every evening.     rivaroxaban 10 MG Tabs tablet  Commonly known as:  XARELTO  Take 1 tablet (10 mg total) by mouth daily.     simvastatin 20 MG tablet  Commonly known as:  ZOCOR  Take 20 mg by mouth every evening.     trospium 20 MG tablet  Commonly known as:  SANCTURA  Take 20 mg by mouth daily.     Vitamin B-12 5000 MCG Subl  Place 5,000 mcg under the tongue daily.        FOLLOW UP VISIT:       Follow-up Information   Follow up with Daron Breeding, PA-C. Schedule an appointment as soon as possible for a visit on 09/25/2012.   Contact information:   [address] [place] [address] [phone]       DISPOSITION:   Home  CONDITION:  Stable   Heather Frederick 09/12/2012, 10:29 AM

## 2012-09-12 NOTE — Progress Notes (Signed)
Occupational Therapy Treatment Patient Details Name: Heather Frederick MRN: [id number] DOB: December 22, 1955 Today's Date: 09/12/2012 Time: 0102-7253 OT Time Calculation (min): 28 min  OT Assessment / Plan / Recommendation Comments on Treatment Session Pt overall min guard assist for mobility during simulated shower transfers.  Already has a shower seat, will need a 3:1 for home as well.  Will have initial 24 hour supervision/assist.  Feel she will benefit from [facility] to progress to odified independent level.      Follow Up Recommendations  Home health OT       Equipment Recommendations  3 in 1 bedside comode       Frequency Min 2X/week      Precautions / Restrictions Precautions Precautions: Knee Restrictions Weight Bearing Restrictions: Yes LLE Weight Bearing: Partial weight bearing LLE Partial Weight Bearing Percentage or Pounds: 50   Pertinent Vitals/Pain Pt reports pain 5/10 in the LLE    ADL  Lower Body Dressing: Performed;Minimal assistance Where Assessed - Lower Body Dressing: Supported sit to stand Tub/Shower Transfer: Simulated;Min guard Tub/Shower Transfer Method: Anterior-posterior;Ambulating Tub/Shower Transfer Equipment: Information systems manager with back Equipment Used: Rolling walker Transfers/Ambulation Related to ADLs: Pt min guard assist for sit to stand from the EOB and bedside chair as well as with mobility. ADL Comments: Pt reporting increased nausea and dizziness at times during session.  She feels it might be related to her pain meds.  Educated pt and husband on the use of AE for LB selfcare as well.  Pt reports having a shower seat already.      OT Goals Acute Rehab OT Goals OT Goal Formulation: With patient ADL Goals Pt Will Perform Grooming: with set-up;with supervision Pt Will Perform Lower Body Bathing: with min assist;with mod assist;with adaptive equipment Pt Will Perform Upper Body Dressing: with min assist;with mod assist Pt Will Perform Lower Body Dressing:  with min assist;with mod assist;with adaptive equipment ADL Goal: Lower Body Dressing - Progress: Met Pt Will Perform Toileting - Clothing Manipulation: with supervision;Standing;Sitting on 3-in-1 or toilet Pt Will Perform Toileting - Hygiene: with supervision;Standing at 3-in-1/toilet;Sitting on 3-in-1 or toilet Pt Will Perform Tub/Shower Transfer: with supervision;with min assist;with DME;Grab bars ADL Goal: Tub/Shower Transfer - Progress: Met  Visit Information  Last OT Received On: 09/12/12 Assistance Needed: +1    Subjective Data  Subjective: Im still feeling a little nauseas and slightly dizzy. Patient Stated Goal: Did not state during session.      Cognition  Cognition Arousal/Alertness: Awake/alert Behavior During Therapy: WFL for tasks assessed/performed Overall Cognitive Status: Within Functional Limits for tasks assessed    Mobility  Bed Mobility Bed Mobility: Supine to Sit;Sit to Supine Supine to Sit: 4: Min assist;With rails Sitting - Scoot to Edge of Bed: 5: Supervision Sit to Supine: 4: Min assist;HOB flat Details for Bed Mobility Assistance: Pt requirs assistance to advance the LLE off of the bed when getting out as well as lifting it into the bed when transferring to supine. Transfers Transfers: Sit to Stand Sit to Stand: 4: Min guard;From bed;With upper extremity assist Stand to Sit: 4: Min guard;With upper extremity assist;To bed       Balance Balance Balance Assessed: Yes Static Standing Balance Static Standing - Balance Support: Right upper extremity supported;Left upper extremity supported Static Standing - Level of Assistance: 5: Stand by assistance   End of Session OT - End of Session Activity Tolerance: Patient limited by pain Patient left: in bed;with call bell/phone within reach;with family/visitor present Nurse Communication: Patient  requests pain meds     Mirage Pfefferkorn OTR/L Pager number [phone] 09/12/2012, 12:51 PM

## 2012-09-12 NOTE — Progress Notes (Signed)
Patient ID: Heather Frederick, female   DOB: 09-14-1955, 57 y.o.   MRN: [id number] PATIENT ID: Heather Frederick        MRN:  [id number]          DOB/AGE: 1955/09/16 / 57 y.o.    Heather Campbell, MD   Heather Code, PA-C [address] [place], [place]  [address]                             [phone]   PROGRESS NOTE  Subjective:  negative for Chest Pain  negative for Shortness of Breath  negative for Nausea/Vomiting   negative for Calf Pain    Tolerating Diet: yes         Patient reports pain as moderate.     Temp to 102 last night, but afebrile this am, comfortable in chair  Objective: Vital signs in last 24 hours:   Patient Vitals for the past 24 hrs:  BP Temp Pulse Resp SpO2  09/12/12 0554 99/51 mmHg 97.8 F (36.6 C) 91 16 93 %  09/12/12 0315 - 102.1 F (38.9 C) - - -  09/11/12 2051 133/48 mmHg 98.8 F (37.1 C) 112 16 93 %  09/11/12 1500 134/57 mmHg 99.7 F (37.6 C) 115 16 92 %      Intake/Output from previous day:   06/18 0701 - 06/19 0700 In: -  Out: 375 [Urine:350; Drains:25]   Intake/Output this shift:       Intake/Output     06/18 0701 - 06/19 0700 06/19 0701 - 06/20 0700   I.V. (mL/kg)     Other     Total Intake(mL/kg)     Urine (mL/kg/hr) 350 (0.1)    Drains 25 (0)    Total Output 375     Net -375             LABORATORY DATA:  Recent Labs  09/10/12 1204 09/11/12 0515 09/12/12 0435  WBC 8.5 9.5 8.5  HGB 13.3 13.0 11.5*  HCT 39.2 39.2 34.4*  PLT 153 152 134*    Recent Labs  09/10/12 1204 09/11/12 0515 09/12/12 0435  NA 141 135 137  K 3.8 4.1 3.9  CL 104 99 101  CO2 28 31 31   BUN 15 8 7   CREATININE 1.03 0.83 0.85  GLUCOSE 118* 163* 152*  CALCIUM 8.7 8.6 8.6   Lab Results  Component Value Date   INR 1.08 09/10/2012   INR 1.01 08/28/2012    Recent Radiographic Studies :   Chest 2 View  09/10/2012   *RADIOLOGY REPORT*  Clinical Data: Preop for knee surgery.  CHEST - 2 VIEW  Comparison: 05/21/2012.  Findings: The cardiac  silhouette, mediastinal and hilar contours are within normal limits and stable.  The lungs are clear.  No pleural effusion.  The bony thorax is intact.  IMPRESSION: No acute cardiopulmonary findings.   Original Report Authenticated By: Rudie Meyer, M.D.     Examination:  General appearance: alert, cooperative and no distress  Wound Exam: clean, dry, intact   Drainage:  None: wound tissue dry-25 cc in hemovac  Motor Exam: EHL, FHL, Anterior Tibial and Posterior Tibial Intact  Sensory Exam: Superficial Peroneal, Deep Peroneal and Tibial normal  Vascular Exam: Normal  Assessment:    2 Days Post-Op  Procedure(s) (LRB): TOTAL KNEE ARTHROPLASTY- left (Left)  ADDITIONAL DIAGNOSIS:  Active Problems:   * No active hospital problems. *  no new problems  Plan: Physical Therapy as ordered Partial Weight Bearing @ 50% (PWB)  DVT Prophylaxis:  Xarelto  DISCHARGE PLAN: Home  DISCHARGE NEEDS: HHPT, CPM and 3-in-1 comode seat  D/C hemovac, dressing changed-ready for PT       Heather Frederick 09/12/2012 9:41 AM

## 2012-10-01 ENCOUNTER — Encounter: Payer: Self-pay | Admitting: Neurology

## 2012-10-01 DIAGNOSIS — R42 Dizziness and giddiness: Secondary | ICD-10-CM

## 2012-10-01 DIAGNOSIS — R4701 Aphasia: Secondary | ICD-10-CM

## 2012-10-02 ENCOUNTER — Encounter: Payer: Self-pay | Admitting: Neurology

## 2012-10-02 ENCOUNTER — Ambulatory Visit (INDEPENDENT_AMBULATORY_CARE_PROVIDER_SITE_OTHER): Payer: BC Managed Care – PPO | Admitting: Neurology

## 2012-10-02 VITALS — BP 106/73 | HR 90 | Ht 64.5 in | Wt 262.0 lb

## 2012-10-02 DIAGNOSIS — G5710 Meralgia paresthetica, unspecified lower limb: Secondary | ICD-10-CM

## 2012-10-02 DIAGNOSIS — G5711 Meralgia paresthetica, right lower limb: Secondary | ICD-10-CM

## 2012-10-02 DIAGNOSIS — G43909 Migraine, unspecified, not intractable, without status migrainosus: Secondary | ICD-10-CM

## 2012-10-02 HISTORY — DX: Meralgia paresthetica, right lower limb: G57.11

## 2012-10-02 MED ORDER — ELETRIPTAN HYDROBROMIDE 40 MG PO TABS: 40.0000 mg | ORAL_TABLET | ORAL | Status: DC | PRN

## 2012-10-02 MED ORDER — FROVATRIPTAN SUCCINATE 2.5 MG PO TABS: 2.5000 mg | ORAL_TABLET | Freq: Two times a day (BID) | ORAL | Status: DC | PRN

## 2012-10-02 NOTE — Progress Notes (Signed)
Reason for visit: Headache  PIERRETTE SCHEU is an 57 y.o. female  History of present illness:  Ms. Ergle is a 57 year old right-handed white female with a history of obesity and migraine headaches. The patient has been on Lyrica taking 200 mg at night. The patient averages about 3 headaches a month, and she indicates that if she takes Relpax, she can shorten the headache significantly, with pain only about 45 minutes to an hour. The patient however, oftentimes will have a headache the next day as well. The patient is not taking her Frova regularly. The patient denies any further episodes of aphasia with her migraine. The patient recently had a left total knee replacement, and she is in physical therapy and out of work at this time. The patient returns to this office for an evaluation. The patient developed a right meralgia paresthetica during surgery, and she still has some numbness of the anterolateral aspect of the right thigh. The patient is walking with a cane.  Past Medical History  Diagnosis Date  . Family history of anesthesia complication     father has a severe hard time waking up  . Anxiety   . Mental disorder   . Chronic kidney disease     UTI'S  . GERD (gastroesophageal reflux disease)   . Headache(784.0)   . Arthritis   . Hard of hearing   . Migraine headache   . Obesity   . Depression   . Lateral epicondylitis of right elbow   . Dyslipidemia   . History of frequent urinary tract infections   . IBS (irritable bowel syndrome)   . Meralgia paresthetica of right side 10/02/2012    Past Surgical History  Procedure Laterality Date  . Joint replacement    . Knee arthroscopy Left 12/12  . Carpel tunnel rel Right 4/12  . Rotator cuff repair Left 6/11  . Plantar fascia release Right 12/10  . Bladder suspension  6/10  . Carpel tunnel left/right  7/08, 8/08 Bilateral 8/08 and 7/08  . Knee arthroscopy Right 12/06  . Tennis elbow release Right 7/04  . Abdominal hysterectomy   1/04    partial  . Cardiac catheterization  9/03  . Total knee arthroplasty Left 09/10/2012    Procedure: TOTAL KNEE ARTHROPLASTY- left;  Surgeon: Valeria Batman, MD;  Location: [facility] OR;  Service: Orthopedics;  Laterality: Left;  Left total knee arthroplasty    Family History  Problem Relation Age of Onset  . Hypertension Mother   . Hypertension Father   . Coronary artery disease Father   . Kidney failure Brother   . Hypertension Brother   . Breast cancer Other     Niece with breast cancer    Social history:  reports that she has been passively smoking.  She has never used smokeless tobacco. She reports that  drinks alcohol. She reports that she does not use illicit drugs.  Allergies:  Allergies  Allergen Reactions  . Topamax (Topiramate) Other (See Comments)    :Stroke like symptoms  . Advil (Ibuprofen) Hives  . Aleve (Naproxen Sodium) Hives  . Bee Venom Swelling  . Echinacea Hives  . Other Other (See Comments)    Feathers cause sinus congestion  . Sulfa Antibiotics Hives    Medications:  Current Outpatient Prescriptions on File Prior to Visit  Medication Sig Dispense Refill  . acetaminophen (TYLENOL) 500 MG tablet Take 1,000 mg by mouth 2 (two) times daily as needed for pain.      Marland Kitchen  citalopram (CELEXA) 20 MG tablet Take 20 mg by mouth every evening.        . Cranberry (HM CRANBERRY SUPER STRENGTH) 300 MG tablet Take 300 mg by mouth 2 (two) times daily.      . Cyanocobalamin (VITAMIN B-12) 5000 MCG SUBL Place 5,000 mcg under the tongue daily.      Marland Kitchen HYDROmorphone (DILAUDID) 2 MG tablet Take 1 tablet (2 mg total) by mouth every 4 (four) hours as needed for pain (Take 1-2 tabes every 4 hours as needed for pain).  90 tablet  0  . loratadine (CLARITIN) 10 MG tablet Take 20 mg by mouth 2 (two) times daily as needed for allergies.       . Lutein-Zeaxanthin 25-5 MG CAPS Take 1 capsule by mouth daily.      . methocarbamol (ROBAXIN) 500 MG tablet Take 1 tablet (500 mg total) by  mouth every 6 (six) hours as needed.  40 tablet  0  . Multiple Vitamin (MULTIVITAMIN WITH MINERALS) TABS Take 1 tablet by mouth daily.      Marland Kitchen nystatin cream (MYCOSTATIN) Apply 1 application topically daily as needed (for itching).      . Omega-3 Fatty Acids (FISH OIL TRIPLE STRENGTH) 1400 MG CAPS Take 1,400 mg by mouth daily.      . pantoprazole (PROTONIX) 40 MG tablet Take 40 mg by mouth every evening.      . pregabalin (LYRICA) 200 MG capsule Take 200 mg by mouth every evening.      . simvastatin (ZOCOR) 20 MG tablet Take 20 mg by mouth every evening.        . trospium (SANCTURA) 20 MG tablet Take 20 mg by mouth daily.       No current facility-administered medications on file prior to visit.    ROS:  Out of a complete 14 system review of symptoms, the patient complains only of the following symptoms, and all other reviewed systems are negative.  Hearing loss Joint pain, joint swelling, achy muscles Numbness of the right thigh Insomnia Headache  Blood pressure 106/73, pulse 90, height 5' 4.5" (1.638 m), weight 262 lb (118.842 kg).  Physical Exam  General: The patient is alert and cooperative at the time of the examination. The patient is markedly obese.  Skin: 1+ edema is noted in both ankles, surgical scar noted on the left knee.   Neurologic Exam  Cranial nerves: Facial symmetry is present. Speech is normal, no aphasia or dysarthria is noted. Extraocular movements are full. Visual fields are full.  Motor: The patient has good strength in all 4 extremities.  Coordination: The patient has good finger-nose-finger and heel-to-shin bilaterally.  Gait and station: The patient has a limping gait on the left leg. The patient is walking with a cane. Tandem gait was not attempted. Romberg is negative. No drift is seen.  Reflexes: Deep tendon reflexes are symmetric.   Assessment/Plan:  1. Migraine headache  2. Obesity  3. Right meralgia paresthetica  4. Recent left total  knee replacement  The patient will continue the Lyrica 200 mg at night. The patient is only having 3 headaches a month, and the headaches are responding to Relpax. The patient is to take Relpax at the onset of the headache, and then take Frova that evening and the following morning to prevent the rebound headache. The patient will followup through this office in 6 months. Prescriptions were given for Relpax and Frova.  Marlan Palau MD 10/02/2012 7:38 PM  [facility] [address] [place], [place] [address]  Phone [phone] Fax [phone]

## 2012-11-27 ENCOUNTER — Other Ambulatory Visit: Payer: Self-pay

## 2012-11-27 MED ORDER — PREGABALIN 200 MG PO CAPS: 200.0000 mg | ORAL_CAPSULE | Freq: Every evening | ORAL | Status: DC

## 2012-11-27 NOTE — Telephone Encounter (Signed)
Rx signed and faxed.

## 2013-01-23 ENCOUNTER — Other Ambulatory Visit: Payer: Self-pay

## 2013-01-23 DIAGNOSIS — Z1231 Encounter for screening mammogram for malignant neoplasm of breast: Secondary | ICD-10-CM

## 2013-01-30 ENCOUNTER — Other Ambulatory Visit: Payer: Self-pay

## 2013-02-11 ENCOUNTER — Telehealth: Payer: Self-pay | Admitting: Neurology

## 2013-02-11 MED ORDER — PROPRANOLOL HCL 10 MG PO TABS: ORAL_TABLET | ORAL | Status: DC

## 2013-02-11 NOTE — Telephone Encounter (Signed)
I called patient. The patient is under a lot of stress, increased anxiety. The headaches have gone from 3 months to 3 a week. I'll add Inderal, see her back in the office.

## 2013-02-12 ENCOUNTER — Ambulatory Visit (INDEPENDENT_AMBULATORY_CARE_PROVIDER_SITE_OTHER): Payer: BC Managed Care – PPO | Admitting: Neurology

## 2013-02-12 ENCOUNTER — Ambulatory Visit (INDEPENDENT_AMBULATORY_CARE_PROVIDER_SITE_OTHER): Payer: BC Managed Care – PPO | Admitting: Nurse Practitioner

## 2013-02-12 ENCOUNTER — Encounter: Payer: Self-pay | Admitting: Neurology

## 2013-02-12 VITALS — BP 120/84 | HR 83 | Resp 18

## 2013-02-12 VITALS — BP 137/76 | HR 86 | Wt 269.0 lb

## 2013-02-12 DIAGNOSIS — G43909 Migraine, unspecified, not intractable, without status migrainosus: Secondary | ICD-10-CM

## 2013-02-12 DIAGNOSIS — R079 Chest pain, unspecified: Secondary | ICD-10-CM

## 2013-02-12 NOTE — Progress Notes (Signed)
Reason for visit: Migraine headache  Heather Frederick is an 57 y.o. female  History of present illness:  Heather Frederick is a 57 year old right-handed white female with a history of significant obesity, and migraine headache. The patient has done quite well with her migraine until the last 2 or 3 months. The patient was having on average 3 headaches a month, but the patient has been under a lot of stress recently. The headaches have increased to about 3 headaches a week. The headaches often times are long in duration, lasting at least 24 hours if not longer. Recently, the patient has begun having visual aura prior to her headache which she has not had previously. The patient takes Relpax, and then she will take a Frova tablet in the evening to prevent a rebound headache. The patient is on Lyrica at nighttime taking 200 mg which was effective previously. The patient returns to this office for an evaluation. Topamax in the past could not be tolerated. 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 The patient has been on a leave of absence from work because of a recent left knee surgery. Propranolol was just called in for the patient, she has not yet started the medication. The patient does have a history of depression, but she indicates that more recently she has had a lot of anxiety, and panic attacks. This morning, the patient had an episode of left upper chest pain, and discomfort into the left shoulder and neck, and down the left arm. The patient still has some discomfort at this time, but it is improving. The patient denies shortness of breath.  Past Medical History  Diagnosis Date  . Family history of anesthesia complication     father has a severe hard time waking up  . Anxiety   . Mental disorder   . Chronic kidney disease    UTI'S  . GERD (gastroesophageal reflux disease)   . Headache(784.0)   . Arthritis   . Hard of hearing   . Migraine headache   . Obesity   . Depression   . Lateral epicondylitis of right elbow   . Dyslipidemia   . History of frequent urinary tract infections   . IBS (irritable bowel syndrome)   . Meralgia paresthetica of right side 10/02/2012    Past Surgical History  Procedure Laterality Date  . Joint replacement    . Knee arthroscopy Left 12/12  . Carpel tunnel rel Right 4/12  . Rotator cuff repair Left 6/11  . Plantar fascia release Right 12/10  . Bladder suspension  6/10  . Carpel tunnel left/right  7/08, 8/08 Bilateral 8/08 and 7/08  . Knee arthroscopy Right 12/06  . Tennis elbow release Right 7/04  . Abdominal hysterectomy  1/04    partial  . Cardiac catheterization  9/03  . Total knee arthroplasty Left 09/10/2012    Procedure: TOTAL KNEE ARTHROPLASTY- left;  Surgeon: Valeria Batman, MD;  Location: [facility] OR;  Service: Orthopedics;  Laterality: Left;  Left total knee arthroplasty    Family History  Problem Relation Age of Onset  . Hypertension Mother   . Hypertension Father   . Coronary artery disease Father   . Kidney failure Brother   . Hypertension Brother   . Breast cancer Other     Niece with breast  cancer    Social history:  reports that she has been passively smoking.  She has never used smokeless tobacco. She reports that she drinks alcohol. She reports that she does not use illicit drugs.    Allergies  Allergen Reactions  . Topamax [Topiramate] Other (See Comments)    :Stroke like symptoms  . Advil [Ibuprofen] Hives  . Aleve [Naproxen Sodium] Hives  . Bee Venom Swelling  . Echinacea Hives  . Other Other (See Comments)    Feathers cause sinus congestion  . Sulfa Antibiotics Hives    Medications:  Current Outpatient Prescriptions on File Prior to Visit  Medication Sig Dispense Refill  . acetaminophen (TYLENOL) 500 MG tablet Take 1,000 mg by mouth  2 (two) times daily as needed for pain.      . citalopram (CELEXA) 20 MG tablet Take 20 mg by mouth every evening.        . Cranberry (HM CRANBERRY SUPER STRENGTH) 300 MG tablet Take 300 mg by mouth 2 (two) times daily.      . Cyanocobalamin (VITAMIN B-12) 5000 MCG SUBL Place 5,000 mcg under the tongue daily.      Marland Kitchen eletriptan (RELPAX) 40 MG tablet Take 1 tablet (40 mg total) by mouth every 2 (two) hours as needed for migraine.  10 tablet  5  . frovatriptan (FROVA) 2.5 MG tablet Take 1 tablet (2.5 mg total) by mouth 2 (two) times daily as needed for migraine.  10 tablet  5  . loratadine (CLARITIN) 10 MG tablet Take 20 mg by mouth 2 (two) times daily as needed for allergies.       . Lutein-Zeaxanthin 25-5 MG CAPS Take 1 capsule by mouth daily.      . Multiple Vitamin (MULTIVITAMIN WITH MINERALS) TABS Take 1 tablet by mouth daily.      Marland Kitchen nystatin cream (MYCOSTATIN) Apply 1 application topically daily as needed (for itching).      . Omega-3 Fatty Acids (FISH OIL TRIPLE STRENGTH) 1400 MG CAPS Take 1,400 mg by mouth daily.      . pantoprazole (PROTONIX) 40 MG tablet Take 40 mg by mouth every evening.      . pregabalin (LYRICA) 200 MG capsule Take 1 capsule (200 mg total) by mouth every evening.  30 capsule  5  . propranolol (INDERAL) 10 MG tablet 1 tablet twice daily for 2 weeks, then take 2 tablets twice daily  120 tablet  3  . simvastatin (ZOCOR) 20 MG tablet Take 20 mg by mouth every evening.        . trospium (SANCTURA) 20 MG tablet Take 20 mg by mouth daily.       No current facility-administered medications on file prior to visit.    ROS:  Out of a complete 14 system review of symptoms, the patient complains only of the following symptoms, and all other reviewed systems are negative.  Hearing loss Joint pain Headache Anxiety  Blood pressure 137/76, pulse 86, weight 269 lb (122.018 kg).  Physical Exam  General: The patient is alert and cooperative at the time of the examination. The  patient is markedly obese.  Neuromuscular: Range of movement of the cervical spine is full.  Skin: No significant peripheral edema is noted.   Neurologic Exam  Mental status: The patient is oriented x 3.  Cranial nerves: Facial symmetry is present. Speech is normal, no aphasia or dysarthria is noted. Extraocular movements are full. Visual fields are full.  Motor: The patient has good strength  in all 4 extremities.  Sensory examination: Soft touch sensation is symmetric on the face, arms, and legs.  Coordination: The patient has good finger-nose-finger and heel-to-shin bilaterally.  Gait and station: The patient has a normal gait. Tandem gait is normal. Romberg is negative. No drift is seen.  Reflexes: Deep tendon reflexes are symmetric, but are depressed.   Assessment/Plan:  1. Migraine headache  2. Obesity  3. Anxiety and depression  The patient has been under stress with increased anxiety recently. The patient been placed on propranolol which she will begin. The patient indicates that she had some chest pain today, but she does not have any prior history of coronary artery disease. I have recommended that she contact her primary care physician today and obtain an EKG. If any question of coronary artery disease is entertained, the patient should not take Relpax or Frova until this is resolved. The patient will otherwise followup in 2 or 3 months. The patient will contact me if she has problems tolerating the propranolol.  Marlan Palau MD 02/12/2013 8:41 PM  [facility] [address] [place], [place] [address]  Phone [phone] Fax [phone]

## 2013-02-12 NOTE — Patient Instructions (Signed)
Migraine Headache A migraine headache is an intense, throbbing pain on one or both sides of your head. A migraine can last for 30 minutes to several hours. CAUSES  The exact cause of a migraine headache is not always known. However, a migraine may be caused when nerves in the brain become irritated and release chemicals that cause inflammation. This causes pain. SYMPTOMS  Pain on one or both sides of your head.  Pulsating or throbbing pain.  Severe pain that prevents daily activities.  Pain that is aggravated by any physical activity.  Nausea, vomiting, or both.  Dizziness.  Pain with exposure to bright lights, loud noises, or activity.  General sensitivity to bright lights, loud noises, or smells. Before you get a migraine, you may get warning signs that a migraine is coming (aura). An aura may include:  Seeing flashing lights.  Seeing bright spots, halos, or zig-zag lines.  Having tunnel vision or blurred vision.  Having feelings of numbness or tingling.  Having trouble talking.  Having muscle weakness. MIGRAINE TRIGGERS  Alcohol.  Smoking.  Stress.  Menstruation.  Aged cheeses.  Foods or drinks that contain nitrates, glutamate, aspartame, or tyramine.  Lack of sleep.  Chocolate.  Caffeine.  Hunger.  Physical exertion.  Fatigue.  Medicines used to treat chest pain (nitroglycerine), birth control pills, estrogen, and some blood pressure medicines. DIAGNOSIS  A migraine headache is often diagnosed based on:  Symptoms.  Physical examination.  A CT scan or MRI of your head. TREATMENT Medicines may be given for pain and nausea. Medicines can also be given to help prevent recurrent migraines.  HOME CARE INSTRUCTIONS  Only take over-the-counter or prescription medicines for pain or discomfort as directed by your caregiver. The use of long-term narcotics is not recommended.  Lie down in a dark, quiet room when you have a migraine.  Keep a journal  to find out what may trigger your migraine headaches. For example, write down:  What you eat and drink.  How much sleep you get.  Any change to your diet or medicines.  Limit alcohol consumption.  Quit smoking if you smoke.  Get 7 to 9 hours of sleep, or as recommended by your caregiver.  Limit stress.  Keep lights dim if bright lights bother you and make your migraines worse. SEEK IMMEDIATE MEDICAL CARE IF:   Your migraine becomes severe.  You have a fever.  You have a stiff neck.  You have vision loss.  You have muscular weakness or loss of muscle control.  You start losing your balance or have trouble walking.  You feel faint or pass out.  You have severe symptoms that are different from your first symptoms. MAKE SURE YOU:   Understand these instructions.  Will watch your condition.  Will get help right away if you are not doing well or get worse. Document Released: 03/13/2005 Document Revised: 06/05/2011 Document Reviewed: 03/03/2011 [employer] Patient Information 2014 [employer].  

## 2013-02-12 NOTE — Progress Notes (Signed)
Patient presents ambulatory as a walk-in visit requesting EKG.  Patient was seen by Dr. Anne Hahn @ [facility] for c/o migraines and reported to him that she was experiencing chest pain radiating down left arm.  Dr. Anne Hahn advised patient to go to PCP today and have EKG.  Patient states she went to PCP office but they were closed for a meeting.  Patient alert and oriented to person, place, time; ambulates without difficulty; skin warm, dry, acyanotic; patient in no acute distress.  Patient taken to an exam room where a 12-lead EKG was performed.  Patient states she was sitting checking her email prior to her appointment at [facility] and suddenly experienced left sided chest pain that radiated down her left arm.  Patient denies that she had SOB, nausea, or diaphoresis and states pain was 4/10.  Patient states it is difficult to describe the pain except that it did not feel like anything she had experienced before and that it was concentrated on the interior of her left arm.  Patient states she can still feel the pain and rates it a 2/10.  I reviewed her EKG with Dr. Katrinka Blazing who compared to previous most recent EKG from 4/14; there were no changes per Dr. Katrinka Blazing and he advised that patient f/u with PCP and have them refer to him if needed.  Patient verbalized agreement and understanding and was discharged in no acute distress.

## 2013-02-24 ENCOUNTER — Ambulatory Visit
Admission: RE | Admit: 2013-02-24 | Discharge: 2013-02-24 | Disposition: A | Discharge status: Home / Self Care | Payer: 59 | Source: Ambulatory Visit

## 2013-02-24 DIAGNOSIS — Z1231 Encounter for screening mammogram for malignant neoplasm of breast: Secondary | ICD-10-CM

## 2013-03-31 ENCOUNTER — Encounter: Payer: Self-pay | Admitting: Nurse Practitioner

## 2013-03-31 ENCOUNTER — Ambulatory Visit (INDEPENDENT_AMBULATORY_CARE_PROVIDER_SITE_OTHER): Payer: BC Managed Care – PPO | Admitting: Nurse Practitioner

## 2013-03-31 VITALS — BP 120/80 | HR 80 | Ht 64.5 in | Wt 275.0 lb

## 2013-03-31 DIAGNOSIS — G43909 Migraine, unspecified, not intractable, without status migrainosus: Secondary | ICD-10-CM

## 2013-03-31 NOTE — Progress Notes (Signed)
I have read the note, and I agree with the clinical assessment and plan.  Heather Frederick,Heather Frederick   

## 2013-03-31 NOTE — Progress Notes (Signed)
[facility]  PATIENT: Heather Frederick DOB: [birth year]   REASON FOR VISIT: migraines   HISTORY OF PRESENT ILLNESS: Heather Frederick, 58 year old white female returns for followup. She was last seen in this office by Dr. Jannifer Franklin 02/12/2013. She was started on Inderal and is currently at 20 mg twice daily. Her headaches have gone from 15 a month to 7 per month. She denies any side effects to the Inderal. She continues to have stress related to her mother being in hospice and the recent death of her father. In addition her mother-in-law has recently been hospitalized. She is also on Lyrica 200 mg at night which has been effective for headaches in the past. Patient also has a history of panic attacks.   HISTORY: of significant obesity, and migraine headache. The patient has done quite well with her migraine until the last 2 or 3 months. The patient was having on average 3 headaches a month, but the patient has been under a lot of stress recently. The headaches have increased to about 3 headaches a week. The headaches often times are long in duration, lasting at least 24 hours if not longer. Recently, the patient has begun having visual aura prior to her headache which she has not had previously. The patient takes Relpax, and then she will take a Frova tablet in the evening to prevent a rebound headache. The patient is on Lyrica at nighttime taking 200 mg which was effective previously. The patient returns to this office for an evaluation. Topamax in the past could not be tolerated. 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 The patient has been on a leave of absence from work because of a recent left knee surgery. Propranolol was just called in for the patient, she has not yet started the medication. The patient does have a  history of depression, but she indicates that more recently she has had a lot of anxiety, and panic attacks. This morning, the patient had an episode of left upper chest pain, and discomfort into the left shoulder and neck, and down the left arm. The patient still has some discomfort at this time, but it is improving. The patient denies shortness of breath.   REVIEW OF SYSTEMS: Full 14 system review of systems performed and notable only for those listed, all others are neg:  Constitutional: N/A  Cardiovascular: N/A  Ear/Nose/Throat: Hearing loss Skin: N/A  Eyes: N/A  Respiratory: N/A  Gastroitestinal: N/A  Hematology/Lymphatic: N/A  Endocrine: N/A Musculoskeletal: Walking difficulty due to recent knee replacement Allergy/Immunology: N/A  Neurological: Headache Psychiatric: Depression anxiety   ALLERGIES: Allergies  Allergen Reactions  . Topamax [Topiramate] Other (See Comments)    :Stroke like symptoms  . Advil [Ibuprofen] Hives  . Aleve [Naproxen Sodium] Hives  . Bee Venom Swelling  . Echinacea Hives  . Other Other (See Comments)    Feathers cause sinus congestion  . Sulfa Antibiotics Hives    HOME MEDICATIONS: Outpatient Prescriptions Prior to Visit  Medication Sig Dispense Refill  . acetaminophen (TYLENOL) 500 MG tablet Take 1,000 mg by mouth 2 (two) times daily as needed for pain.      . citalopram (CELEXA) 20 MG tablet Take 20 mg by mouth every evening.        . Cranberry (HM CRANBERRY SUPER STRENGTH) 300 MG tablet  Take 300 mg by mouth 2 (two) times daily.      . Cyanocobalamin (VITAMIN B-12) 5000 MCG SUBL Place 5,000 mcg under the tongue daily.      Marland Kitchen eletriptan (RELPAX) 40 MG tablet Take 1 tablet (40 mg total) by mouth every 2 (two) hours as needed for migraine.  10 tablet  5  . frovatriptan (FROVA) 2.5 MG tablet Take 1 tablet (2.5 mg total) by mouth 2 (two) times daily as needed for migraine.  10 tablet  5  . loratadine (CLARITIN) 10 MG tablet Take 20 mg by mouth 2  (two) times daily as needed for allergies.       . Lutein-Zeaxanthin 25-5 MG CAPS Take 1 capsule by mouth daily.      . Multiple Vitamin (MULTIVITAMIN WITH MINERALS) TABS Take 1 tablet by mouth daily.      Marland Kitchen nystatin cream (MYCOSTATIN) Apply 1 application topically daily as needed (for itching).      . Omega-3 Fatty Acids (FISH OIL TRIPLE STRENGTH) 1400 MG CAPS Take 1,400 mg by mouth daily.      . pantoprazole (PROTONIX) 40 MG tablet Take 40 mg by mouth every evening.      . pregabalin (LYRICA) 200 MG capsule Take 1 capsule (200 mg total) by mouth every evening.  30 capsule  5  . propranolol (INDERAL) 10 MG tablet 1 tablet twice daily for 2 weeks, then take 2 tablets twice daily  120 tablet  3  . simvastatin (ZOCOR) 20 MG tablet Take 20 mg by mouth every evening.        . trospium (SANCTURA) 20 MG tablet Take 20 mg by mouth daily.       No facility-administered medications prior to visit.    PAST MEDICAL HISTORY: Past Medical History  Diagnosis Date  . Family history of anesthesia complication     father has a severe hard time waking up  . Anxiety   . Mental disorder   . Chronic kidney disease     UTI'S  . GERD (gastroesophageal reflux disease)   . Headache(784.0)   . Arthritis   . Hard of hearing   . Migraine headache   . Obesity   . Depression   . Lateral epicondylitis of right elbow   . Dyslipidemia   . History of frequent urinary tract infections   . IBS (irritable bowel syndrome)   . Meralgia paresthetica of right side 10/02/2012    PAST SURGICAL HISTORY: Past Surgical History  Procedure Laterality Date  . Joint replacement    . Knee arthroscopy Left 12/12  . Carpel tunnel rel Right 4/12  . Rotator cuff repair Left 6/11  . Plantar fascia release Right 12/10  . Bladder suspension  6/10  . Carpel tunnel left/right  7/08, 8/08 Bilateral 8/08 and 7/08  . Knee arthroscopy Right 12/06  . Tennis elbow release Right 7/04  . Abdominal hysterectomy  1/04    partial  .  Cardiac catheterization  9/03  . Total knee arthroplasty Left 09/10/2012    Procedure: TOTAL KNEE ARTHROPLASTY- left;  Surgeon: Garald Balding, MD;  Location: [facility];  Service: Orthopedics;  Laterality: Left;  Left total knee arthroplasty    FAMILY HISTORY: Family History  Problem Relation Age of Onset  . Hypertension Mother   . Hypertension Father   . Coronary artery disease Father   . Kidney failure Brother   . Hypertension Brother   . Breast cancer Other     Niece with  breast cancer    SOCIAL HISTORY: History   Social History  . Marital Status: Married    Spouse Name: Remo Lipps    Number of Children: 2  . Years of Education: 16   Occupational History  .     Social History Main Topics  . Smoking status: Passive Smoke Exposure - Never Smoker -- 0.00 packs/day  . Smokeless tobacco: Never Used  . Alcohol Use: Yes     Comment: occasionally  . Drug Use: No  . Sexual Activity: Not on file   Other Topics Concern  . Not on file   Social History Narrative   Patient is married Remo Lipps) and lives at home with her husband and child.   Patient has two children.   Patient has a college education.   Patient is right-handed.   Patient drinks one cup of coffee, tea daily.     PHYSICAL EXAM  Filed Vitals:   03/31/13 1026  BP: 120/80  Pulse: 80  Height: 5' 4.5" (1.638 m)  Weight: 275 lb (124.739 kg)   Body mass index is 46.49 kg/(m^2).  Generalized: Well developed, morbidly obese female in no acute distress  Head: normocephalic and atraumatic,. Oropharynx benign  Neck: Supple, no carotid bruits  Cardiac: Regular rate rhythm, no murmur  Musculoskeletal: No deformity   Neurological examination   Mentation: Alert oriented to time, place, history taking. Follows all commands speech and language fluent  Cranial nerve II-XII: Pupils were equal round reactive to light extraocular movements were full, visual field were full on confrontational test. Facial sensation and  strength were normal. hearing was intact to finger rubbing bilaterally. Uvula tongue midline. head turning and shoulder shrug were normal and symmetric.Tongue protrusion into cheek strength was normal. Motor: normal bulk and tone, full strength in the BUE, BLE, fine finger movements normal, no pronator drift. No focal weakness Coordination: finger-nose-finger, heel-to-shin bilaterally, no dysmetria Reflexes: Depressed and symmetric Gait and Station: Rising up from seated position without assistance, normal stance,  moderate stride, good arm swing, smooth turning, able to perform tiptoe, and heel walking without difficulty. Tandem gait is steady  DIAGNOSTIC DATA (LABS, IMAGING, TESTING) - I reviewed patient records, labs, notes, testing and imaging myself where available.  Lab Results  Component Value Date   WBC 8.5 09/12/2012   HGB 11.5* 09/12/2012   HCT 34.4* 09/12/2012   MCV 89.4 09/12/2012   PLT 134* 09/12/2012      Component Value Date/Time   NA 137 09/12/2012 0435   K 3.9 09/12/2012 0435   CL 101 09/12/2012 0435   CO2 31 09/12/2012 0435   GLUCOSE 152* 09/12/2012 0435   BUN 7 09/12/2012 0435   CREATININE 0.85 09/12/2012 0435   CALCIUM 8.6 09/12/2012 0435   PROT 6.5 09/10/2012 1204   ALBUMIN 3.6 09/10/2012 1204   AST 23 09/10/2012 1204   ALT 34 09/10/2012 1204   ALKPHOS 66 09/10/2012 1204   BILITOT 0.3 09/10/2012 1204   GFRNONAA 76* 09/12/2012 0435   GFRAA 88* 09/12/2012 0435    ASSESSMENT AND PLAN  58 y.o. year old female  has a past medical history of Anxiety;  Chronic kidney disease; GERD (gastroesophageal reflux disease); Headache(784.0); Arthritis; Hard of hearing; Migraine headache; Obesity; Depression; Lateral epicondylitis of right elbow; Dyslipidemia; History of frequent urinary tract infections; IBS (irritable bowel syndrome); and Meralgia paresthetica of right side (10/02/2012). here to followup for migraine. Her frequency of headache has dropped from 15 per month to 7 per month with  Inderal.  Increase Inderal to 30mg  twice daily for 2 weeks then if needed can increase to 4 tabs  Twice daily Keep record of headaches F/U in 2 months Dennie Bible, [facility], [facility], [facility] [address] [place], [place] [address] [phone]

## 2013-03-31 NOTE — Patient Instructions (Signed)
Increase Inderal to 30mg  twice daily for 2 weeks then if needed can increase to 4 tabs  Twice daily Keep record of headaches F/U in 2 months

## 2013-04-01 ENCOUNTER — Other Ambulatory Visit: Payer: Self-pay | Admitting: Nurse Practitioner

## 2013-04-01 MED ORDER — PROPRANOLOL HCL 10 MG PO TABS: ORAL_TABLET | ORAL | Status: DC

## 2013-06-03 ENCOUNTER — Other Ambulatory Visit: Payer: Self-pay | Admitting: Neurology

## 2013-06-03 MED ORDER — PREGABALIN 200 MG PO CAPS: 200.0000 mg | ORAL_CAPSULE | Freq: Every evening | ORAL | Status: DC

## 2013-06-03 NOTE — Telephone Encounter (Signed)
Rx signed and faxed.

## 2013-06-03 NOTE — Telephone Encounter (Signed)
Heather Frederick at [facility] called. He stated that they had faxed over a request for refill for the Pt's Lyrica. Pt is at the pharmacy now to pick it up and she is completely out.  Please contact the pharmacy at [phone].  Thank you

## 2013-06-03 NOTE — Telephone Encounter (Signed)
The pharmacy just faxed [place] this request at 9:04 am today.  We have not had time to process it yet.  I called the pharmacy.  Spoke with Manuela Schwartz.  She said the patient said she will just come back later.  No problem.  Request has been sent to provider for approval.

## 2013-06-24 ENCOUNTER — Ambulatory Visit (INDEPENDENT_AMBULATORY_CARE_PROVIDER_SITE_OTHER): Payer: 59 | Admitting: Neurology

## 2013-06-24 ENCOUNTER — Encounter: Payer: Self-pay | Admitting: Neurology

## 2013-06-24 VITALS — BP 101/63 | HR 59 | Ht 64.0 in | Wt 276.0 lb

## 2013-06-24 DIAGNOSIS — G43909 Migraine, unspecified, not intractable, without status migrainosus: Secondary | ICD-10-CM

## 2013-06-24 DIAGNOSIS — R42 Dizziness and giddiness: Secondary | ICD-10-CM

## 2013-06-24 MED ORDER — PROPRANOLOL HCL 40 MG PO TABS: 40.0000 mg | ORAL_TABLET | Freq: Two times a day (BID) | ORAL | Status: DC

## 2013-06-24 NOTE — Progress Notes (Signed)
Reason for visit: Headache  Heather Frederick is an 58 y.o. female  History of present illness:  Heather Frederick is a 58 year old right-handed white female with a history of headache. The patient initially was having 15 days a month with headache, and she was having a lot of stress associated with panic attacks. The patient has been placed on propranolol, and she is tolerating the medication well. The patient is currently on 30 mg twice daily, and she is tolerating the medication well. The patient indicates that she is having on average 3 headaches a month, rarely being severe. The patient has noted that the use of medication such as Frova may improve her headaches. The patient indicates that she quit her job, and this has helped her stress level. The patient returns to this office for an evaluation. The patient does indicate a mild gait instability at times, with a tendency to lean backwards.  Past Medical History  Diagnosis Date  . Family history of anesthesia complication     father has a severe hard time waking up  . Anxiety   . Mental disorder   . Chronic kidney disease     UTI'S  . GERD (gastroesophageal reflux disease)   . Headache(784.0)   . Arthritis   . Hard of hearing   . Migraine headache   . Obesity   . Depression   . Lateral epicondylitis of right elbow   . Dyslipidemia   . History of frequent urinary tract infections   . IBS (irritable bowel syndrome)   . Meralgia paresthetica of right side 10/02/2012    Past Surgical History  Procedure Laterality Date  . Joint replacement    . Knee arthroscopy Left 12/12  . Carpel tunnel rel Right 4/12  . Rotator cuff repair Left 6/11  . Plantar fascia release Right 12/10  . Bladder suspension  6/10  . Carpel tunnel left/right  7/08, 8/08 Bilateral 8/08 and 7/08  . Knee arthroscopy Right 12/06  . Tennis elbow release Right 7/04  . Abdominal hysterectomy  1/04    partial  . Cardiac catheterization  9/03  . Total knee arthroplasty  Left 09/10/2012    Procedure: TOTAL KNEE ARTHROPLASTY- left;  Surgeon: Garald Balding, MD;  Location: [facility];  Service: Orthopedics;  Laterality: Left;  Left total knee arthroplasty    Family History  Problem Relation Age of Onset  . Hypertension Mother   . Hypertension Father   . Coronary artery disease Father   . Migraines Father   . Kidney failure Brother   . Hypertension Brother   . Migraines Brother   . Breast cancer Other     Niece with breast cancer  . Migraines Daughter     Social history:  reports that she has been passively smoking.  She has never used smokeless tobacco. She reports that she drinks alcohol. She reports that she does not use illicit drugs.    Allergies  Allergen Reactions  . Topamax [Topiramate] Other (See Comments)    :Stroke like symptoms  . Advil [Ibuprofen] Hives  . Aleve [Naproxen Sodium] Hives  . Bee Venom Swelling  . Echinacea Hives  . Other Other (See Comments)    Feathers cause sinus congestion  . Sulfa Antibiotics Hives    Medications:  Current Outpatient Prescriptions on File Prior to Visit  Medication Sig Dispense Refill  . acetaminophen (TYLENOL) 500 MG tablet Take 1,000 mg by mouth 2 (two) times daily as needed for pain.      Marland Kitchen  citalopram (CELEXA) 20 MG tablet Take 20 mg by mouth every evening.        . Cranberry (HM CRANBERRY SUPER STRENGTH) 300 MG tablet Take 300 mg by mouth 2 (two) times daily.      . Cyanocobalamin (VITAMIN B-12) 5000 MCG SUBL Place 5,000 mcg under the tongue daily.      Marland Kitchen eletriptan (RELPAX) 40 MG tablet Take 1 tablet (40 mg total) by mouth every 2 (two) hours as needed for migraine.  10 tablet  5  . frovatriptan (FROVA) 2.5 MG tablet Take 1 tablet (2.5 mg total) by mouth 2 (two) times daily as needed for migraine.  10 tablet  5  . loratadine (CLARITIN) 10 MG tablet Take 20 mg by mouth 2 (two) times daily as needed for allergies.       . Lutein-Zeaxanthin 25-5 MG CAPS Take 1 capsule by mouth daily.      .  Multiple Vitamin (MULTIVITAMIN WITH MINERALS) TABS Take 1 tablet by mouth daily.      Marland Kitchen nystatin cream (MYCOSTATIN) Apply 1 application topically daily as needed (for itching).      . Omega-3 Fatty Acids (FISH OIL TRIPLE STRENGTH) 1400 MG CAPS Take 1,400 mg by mouth daily.      . pantoprazole (PROTONIX) 40 MG tablet Take 40 mg by mouth every evening.      . pregabalin (LYRICA) 200 MG capsule Take 1 capsule (200 mg total) by mouth every evening.  30 capsule  5  . simvastatin (ZOCOR) 20 MG tablet Take 20 mg by mouth every evening.         No current facility-administered medications on file prior to visit.    ROS:  Out of a complete 14 system review of symptoms, the patient complains only of the following symptoms, and all other reviewed systems are negative.  Chest tightness Back pain  Blood pressure 101/63, pulse 59, height 5\' 4"  (1.626 m), weight 276 lb (125.193 kg).  Physical Exam  General: The patient is alert and cooperative at the time of the examination. The patient is markedly obese.  Skin: No significant peripheral edema is noted.   Neurologic Exam  Mental status: The patient is oriented x 3.  Cranial nerves: Facial symmetry is present. Speech is normal, no aphasia or dysarthria is noted. Extraocular movements are full. Visual fields are full.  Motor: The patient has good strength in all 4 extremities.  Sensory examination: Soft touch sensation is symmetric on the face, arms, and legs.  Coordination: The patient has good finger-nose-finger and heel-to-shin bilaterally.  Gait and station: The patient has a normal gait. Tandem gait is slightly unsteady. Romberg is negative. No drift is seen.  Reflexes: Deep tendon reflexes are symmetric.   Assessment/Plan:  1. Migraine headache  2. Anxiety disorder  3. Obesity  The patient is doing fairly well with the propranolol. The patient will be given a 40 mg tablets taking one twice daily. The patient will followup  through this office in about 6 months. The patient will contact our office if her headaches seem to worsen.  Jill Alexanders MD 06/24/2013 7:49 PM  [facility] [address] [place], [place] [address]  Phone [phone] Fax [phone]

## 2013-06-24 NOTE — Patient Instructions (Signed)
Migraine Headache A migraine headache is an intense, throbbing pain on one or both sides of your head. A migraine can last for 30 minutes to several hours. CAUSES  The exact cause of a migraine headache is not always known. However, a migraine may be caused when nerves in the brain become irritated and release chemicals that cause inflammation. This causes pain. Certain things may also trigger migraines, such as:  Alcohol.  Smoking.  Stress.  Menstruation.  Aged cheeses.  Foods or drinks that contain nitrates, glutamate, aspartame, or tyramine.  Lack of sleep.  Chocolate.  Caffeine.  Hunger.  Physical exertion.  Fatigue.  Medicines used to treat chest pain (nitroglycerine), birth control pills, estrogen, and some blood pressure medicines. SIGNS AND SYMPTOMS  Pain on one or both sides of your head.  Pulsating or throbbing pain.  Severe pain that prevents daily activities.  Pain that is aggravated by any physical activity.  Nausea, vomiting, or both.  Dizziness.  Pain with exposure to bright lights, loud noises, or activity.  General sensitivity to bright lights, loud noises, or smells. Before you get a migraine, you may get warning signs that a migraine is coming (aura). An aura may include:  Seeing flashing lights.  Seeing bright spots, halos, or zig-zag lines.  Having tunnel vision or blurred vision.  Having feelings of numbness or tingling.  Having trouble talking.  Having muscle weakness. DIAGNOSIS  A migraine headache is often diagnosed based on:  Symptoms.  Physical exam.  A CT scan or MRI of your head. These imaging tests cannot diagnose migraines, but they can help rule out other causes of headaches. TREATMENT Medicines may be given for pain and nausea. Medicines can also be given to help prevent recurrent migraines.  HOME CARE INSTRUCTIONS  Only take over-the-counter or prescription medicines for pain or discomfort as directed by your  health care provider. The use of long-term narcotics is not recommended.  Lie down in a dark, quiet room when you have a migraine.  Keep a journal to find out what may trigger your migraine headaches. For example, write down:  What you eat and drink.  How much sleep you get.  Any change to your diet or medicines.  Limit alcohol consumption.  Quit smoking if you smoke.  Get 7 9 hours of sleep, or as recommended by your health care provider.  Limit stress.  Keep lights dim if bright lights bother you and make your migraines worse. SEEK IMMEDIATE MEDICAL CARE IF:   Your migraine becomes severe.  You have a fever.  You have a stiff neck.  You have vision loss.  You have muscular weakness or loss of muscle control.  You start losing your balance or have trouble walking.  You feel faint or pass out.  You have severe symptoms that are different from your first symptoms. MAKE SURE YOU:   Understand these instructions.  Will watch your condition.  Will get help right away if you are not doing well or get worse. Document Released: 03/13/2005 Document Revised: 01/01/2013 Document Reviewed: 11/18/2012 Exit[employer] Patient Information 2014 [employer].  

## 2013-07-09 ENCOUNTER — Ambulatory Visit
Admission: RE | Admit: 2013-07-09 | Discharge: 2013-07-09 | Disposition: A | Discharge status: Home / Self Care | Payer: 59 | Source: Ambulatory Visit | Attending: Internal Medicine | Admitting: Internal Medicine

## 2013-07-09 ENCOUNTER — Other Ambulatory Visit: Payer: Self-pay | Admitting: Internal Medicine

## 2013-07-09 DIAGNOSIS — R0609 Other forms of dyspnea: Secondary | ICD-10-CM

## 2013-07-09 DIAGNOSIS — R0989 Other specified symptoms and signs involving the circulatory and respiratory systems: Principal | ICD-10-CM

## 2013-10-08 ENCOUNTER — Other Ambulatory Visit (HOSPITAL_COMMUNITY): Payer: Self-pay | Admitting: Respiratory Therapy

## 2013-10-08 DIAGNOSIS — R0602 Shortness of breath: Secondary | ICD-10-CM

## 2013-10-23 ENCOUNTER — Ambulatory Visit (HOSPITAL_COMMUNITY)
Admission: RE | Admit: 2013-10-23 | Discharge: 2013-10-23 | Disposition: A | Discharge status: Home / Self Care | Payer: 59 | Source: Ambulatory Visit | Attending: Internal Medicine | Admitting: Internal Medicine

## 2013-10-23 DIAGNOSIS — R0602 Shortness of breath: Secondary | ICD-10-CM | POA: Diagnosis present

## 2013-10-23 MED ORDER — ALBUTEROL SULFATE (2.5 MG/3ML) 0.083% IN NEBU
2.5000 mg | INHALATION_SOLUTION | Freq: Once | RESPIRATORY_TRACT | Status: AC
  Administered 2013-10-23: 2.5 mg via RESPIRATORY_TRACT

## 2013-10-24 LAB — PULMONARY FUNCTION TEST
DL/VA % PRED: 138 %
DL/VA: 6.53 ml/min/mmHg/L
DLCO COR: 25.8 ml/min/mmHg
DLCO UNC % PRED: 109 %
DLCO cor % pred: 109 %
DLCO unc: 25.8 ml/min/mmHg
FEF 25-75 POST: 2.66 L/s
FEF 25-75 PRE: 2.9 L/s
FEF2575-%CHANGE-POST: -8 %
FEF2575-%PRED-POST: 106 %
FEF2575-%Pred-Pre: 116 %
FEV1-%Change-Post: -3 %
FEV1-%Pred-Post: 91 %
FEV1-%Pred-Pre: 95 %
FEV1-POST: 2.39 L
FEV1-PRE: 2.48 L
FEV1FVC-%CHANGE-POST: -1 %
FEV1FVC-%PRED-PRE: 106 %
FEV6-%CHANGE-POST: -2 %
FEV6-%PRED-PRE: 90 %
FEV6-%Pred-Post: 88 %
FEV6-Post: 2.86 L
FEV6-Pre: 2.94 L
FEV6FVC-%PRED-POST: 103 %
FEV6FVC-%Pred-Pre: 103 %
FVC-%CHANGE-POST: -2 %
FVC-%PRED-POST: 85 %
FVC-%Pred-Pre: 87 %
FVC-PRE: 2.94 L
FVC-Post: 2.86 L
PRE FEV1/FVC RATIO: 84 %
Post FEV1/FVC ratio: 84 %
Post FEV6/FVC ratio: 100 %
Pre FEV6/FVC Ratio: 100 %
RV % PRED: 100 %
RV: 1.89 L
TLC % pred: 99 %
TLC: 4.96 L

## 2013-10-28 ENCOUNTER — Other Ambulatory Visit: Payer: Self-pay

## 2013-10-28 MED ORDER — ELETRIPTAN HYDROBROMIDE 40 MG PO TABS: 40.0000 mg | ORAL_TABLET | ORAL | Status: DC | PRN

## 2013-11-10 ENCOUNTER — Ambulatory Visit (HOSPITAL_COMMUNITY): Payer: 59 | Attending: Cardiology | Admitting: Radiology

## 2013-11-10 ENCOUNTER — Other Ambulatory Visit (HOSPITAL_COMMUNITY): Payer: Self-pay | Admitting: Internal Medicine

## 2013-11-10 ENCOUNTER — Other Ambulatory Visit: Payer: Self-pay

## 2013-11-10 DIAGNOSIS — R0602 Shortness of breath: Secondary | ICD-10-CM

## 2013-11-10 NOTE — Progress Notes (Signed)
Echocardiogram performed.  

## 2013-11-27 ENCOUNTER — Other Ambulatory Visit: Payer: Self-pay

## 2013-11-27 MED ORDER — PREGABALIN 200 MG PO CAPS: 200.0000 mg | ORAL_CAPSULE | Freq: Every evening | ORAL | Status: DC

## 2013-11-27 NOTE — Telephone Encounter (Signed)
Rx signed and faxed.

## 2013-12-24 ENCOUNTER — Ambulatory Visit: Payer: 59 | Admitting: Nurse Practitioner

## 2014-01-09 ENCOUNTER — Other Ambulatory Visit: Payer: Self-pay

## 2014-01-13 ENCOUNTER — Encounter: Payer: Self-pay | Admitting: Interventional Cardiology

## 2014-01-13 ENCOUNTER — Ambulatory Visit (INDEPENDENT_AMBULATORY_CARE_PROVIDER_SITE_OTHER): Payer: 59 | Admitting: Interventional Cardiology

## 2014-01-13 VITALS — BP 110/76 | HR 61 | Ht 65.5 in | Wt 288.0 lb

## 2014-01-13 DIAGNOSIS — R0600 Dyspnea, unspecified: Secondary | ICD-10-CM

## 2014-01-13 DIAGNOSIS — R079 Chest pain, unspecified: Secondary | ICD-10-CM

## 2014-01-13 LAB — BRAIN NATRIURETIC PEPTIDE: Pro B Natriuretic peptide (BNP): 10 pg/mL (ref 0.0–100.0)

## 2014-01-13 NOTE — Patient Instructions (Addendum)
Your physician recommends that you continue on your current medications as directed. Please refer to the Current Medication list given to you today.  Lab Today: Bnp  Your physician has requested that you have a lexiscan myoview. For further information please visit [email or web address]. Please follow instruction sheet, as given.  You have a follow up appointment scheduled on 02/16/14 @12pm 

## 2014-01-13 NOTE — Progress Notes (Deleted)
Patient ID: Heather Frederick, female   DOB: January 20, 1956, 58 y.o.   MRN: [id number]   Date: 01/13/2014 ID: [name], DOB Dec 17, 1955, MRN [id number] PCP: Cloyd Stagers, MD  Reason: ***  ASSESSMENT;  1. ***  PLAN:  1. ***   SUBJECTIVE: [name] is a 58 y.o. female who is ***   Allergies  Allergen Reactions  . Topamax [Topiramate] Other (See Comments)    :Stroke like symptoms  . Advil [Ibuprofen] Hives  . Aleve [Naproxen Sodium] Hives  . Bee Venom Swelling  . Echinacea Hives  . Other Other (See Comments)    Feathers cause sinus congestion  . Sulfa Antibiotics Hives    Current Outpatient Prescriptions on File Prior to Visit  Medication Sig Dispense Refill  . acetaminophen (TYLENOL) 500 MG tablet Take 1,000 mg by mouth 2 (two) times daily as needed for pain.      . citalopram (CELEXA) 20 MG tablet Take 20 mg by mouth every evening.        . clotrimazole-betamethasone (LOTRISONE) cream       . Cranberry (HM CRANBERRY SUPER STRENGTH) 300 MG tablet Take 300 mg by mouth 2 (two) times daily.      . Cyanocobalamin (VITAMIN B-12) 5000 MCG SUBL Place 2,500 mcg under the tongue daily.       Marland Kitchen eletriptan (RELPAX) 40 MG tablet Take 1 tablet (40 mg total) by mouth every 2 (two) hours as needed for migraine.  8 tablet  5  . frovatriptan (FROVA) 2.5 MG tablet Take 1 tablet (2.5 mg total) by mouth 2 (two) times daily as needed for migraine.  10 tablet  5  . loratadine (CLARITIN) 10 MG tablet Take 20 mg by mouth 2 (two) times daily as needed for allergies.       . Lutein-Zeaxanthin 25-5 MG CAPS Take 1 capsule by mouth daily.      . Multiple Vitamin (MULTIVITAMIN WITH MINERALS) TABS Take 1 tablet by mouth daily.      Marland Kitchen nystatin cream (MYCOSTATIN) Apply 1 application topically daily as needed (for itching).      . Omega-3 Fatty Acids (FISH OIL TRIPLE STRENGTH) 1400 MG CAPS Take 1,200 mg by mouth daily.       Marland Kitchen oxybutynin (DITROPAN-XL) 10 MG 24 hr tablet Take 10 mg by mouth  daily.      . pantoprazole (PROTONIX) 40 MG tablet Take 40 mg by mouth every morning.       . pregabalin (LYRICA) 200 MG capsule Take 1 capsule (200 mg total) by mouth every evening.  30 capsule  3  . propranolol (INDERAL) 40 MG tablet Take 1 tablet (40 mg total) by mouth 2 (two) times daily.  60 tablet  5  . simvastatin (ZOCOR) 20 MG tablet Take 20 mg by mouth every evening.        No current facility-administered medications on file prior to visit.    Past Medical History  Diagnosis Date  . Family history of anesthesia complication     father has a severe hard time waking up  . Anxiety   . Mental disorder   . Chronic kidney disease     UTI'S  . GERD (gastroesophageal reflux disease)   . Headache(784.0)   . Arthritis   . Hard of hearing   . Migraine headache   . Obesity   . Depression   . Lateral epicondylitis of right elbow   . Dyslipidemia   . History of frequent urinary  tract infections   . IBS (irritable bowel syndrome)   . Meralgia paresthetica of right side 10/02/2012    Past Surgical History  Procedure Laterality Date  . Joint replacement    . Knee arthroscopy Left 12/12  . Carpel tunnel rel Right 4/12  . Rotator cuff repair Left 6/11  . Plantar fascia release Right 12/10  . Bladder suspension  6/10  . Carpel tunnel left/right  7/08, 8/08 Bilateral 8/08 and 7/08  . Knee arthroscopy Right 12/06  . Tennis elbow release Right 7/04  . Abdominal hysterectomy  1/04    partial  . Cardiac catheterization  9/03  . Total knee arthroplasty Left 09/10/2012    Procedure: TOTAL KNEE ARTHROPLASTY- left;  Surgeon: Garald Balding, MD;  Location: [facility];  Service: Orthopedics;  Laterality: Left;  Left total knee arthroplasty    History   Social History  . Marital Status: Married    Spouse Name: Remo Lipps    Number of Children: 2  . Years of Education: 16   Occupational History  .     Social History Main Topics  . Smoking status: Passive Smoke Exposure - Never Smoker --  0.00 packs/day  . Smokeless tobacco: Never Used  . Alcohol Use: Yes     Comment: occasionally  . Drug Use: No  . Sexual Activity: Not on file   Other Topics Concern  . Not on file   Social History Narrative   Patient is married Remo Lipps) and lives at home with her husband and child.   Patient has two children.   Patient has a college education.   Patient is right-handed.   Patient drinks one cup of coffee, tea daily.    Family History  Problem Relation Age of Onset  . Hypertension Mother   . Hypertension Father   . Coronary artery disease Father   . Migraines Father   . Kidney failure Brother   . Hypertension Brother   . Migraines Brother   . Breast cancer Other     Niece with breast cancer  . Migraines Daughter     ROS: ***. Other systems negative for complaints.  OBJECTIVE: BP 110/76  Pulse 61  Ht 5' 5.5" (1.664 m)  Wt 288 lb (130.636 kg)  BMI 47.18 kg/m2, *** HELP TEXT ***  This SmartLink requires parameters. Parameters are variables that are added to the [facility] name to request specific information. The parameter for lastwt is the number of readings to display.  For example: .lastwt[4  In this example, the [place] displays the last four encounter readings.  General: No acute distress, *** HEENT: normal *** Neck: JVD ***. Carotids *** Chest: *** Cardiac: Murmur: ***. Gallop: ***. Rhythm: ***. Other: *** Abdomen: Bruit: ***. Pulsation: *** Extremities: Edema: ***. Pulses: *** Neuro: *** Psych: ***  ECG: ***

## 2014-01-13 NOTE — Progress Notes (Signed)
Patient ID: Heather Frederick, female   DOB: 1955/09/02, 58 y.o.   MRN: [id number]   Date: 01/13/2014 ID: Heather Frederick, DOB Oct 28, 1955, MRN [id number] PCP: Cloyd Stagers, MD  Reason: Dyspnea and Chest discomfort  ASSESSMENT:  1. Chest pressure, occurring at rest, 1 occurrence lasting 20 minutes 2. Exertional dyspnea and orthopnea 3. Obesity 4. Snoring 5. Abnormal pulmonary function tests with diffusion abnormality suggesting congestive heart 6. Left ventricular hypertrophy on echo, moderate  PLAN:  1. pharmacologic myocardial perfusion study to rule out coronary artery disease as a cause of chest discomfort and dyspnea. Unable to walk on a treadmill due to orthopedic issues to prevent walking any significant distance. 2. BNP 3. Consider sleep study of above two studies are normal 4. Clinical followup in one month   SUBJECTIVE: Heather Frederick is a 58 y.o. female who is here for evaluation of dyspnea and chest discomfort. She had a single episode of chest tightness that lasted 20 minutes and radiated into her neck. This occurred at rest approximately 2 weeks. Over the last 6 months she has experienced progressive dyspnea on exertion, occasional wheezing, and orthopnea. Pulmonary function testing done by her primary physician demonstrated a diffusion abnormality suggestive of CHF. An echocardiogram was performed and demonstrated moderate left ventricular hypertrophy with normal LV function and mild left atrial enlargement. She has no pre-existing history of heart disease. Both the mother and father have coronary artery disease. Her file is deceased. She has noted a progressive increase in weight. She snores and has excessive daytime sleepiness. She has not had palpitations or syncope. She has osteoarthritis both knees. Has a history of prior cardiac catheterization 2003 that was unremarkable.   Allergies  Allergen Reactions  . Topamax [Topiramate] Other (See Comments)   :Stroke like symptoms  . Advil [Ibuprofen] Hives  . Aleve [Naproxen Sodium] Hives  . Bee Venom Swelling  . Echinacea Hives  . Other Other (See Comments)    Feathers cause sinus congestion  . Sulfa Antibiotics Hives    Current Outpatient Prescriptions on File Prior to Visit  Medication Sig Dispense Refill  . acetaminophen (TYLENOL) 500 MG tablet Take 1,000 mg by mouth 2 (two) times daily as needed for pain.      . citalopram (CELEXA) 20 MG tablet Take 20 mg by mouth every evening.        . clotrimazole-betamethasone (LOTRISONE) cream       . Cranberry (HM CRANBERRY SUPER STRENGTH) 300 MG tablet Take 300 mg by mouth 2 (two) times daily.      . Cyanocobalamin (VITAMIN B-12) 5000 MCG SUBL Place 2,500 mcg under the tongue daily.       Marland Kitchen eletriptan (RELPAX) 40 MG tablet Take 1 tablet (40 mg total) by mouth every 2 (two) hours as needed for migraine.  8 tablet  5  . frovatriptan (FROVA) 2.5 MG tablet Take 1 tablet (2.5 mg total) by mouth 2 (two) times daily as needed for migraine.  10 tablet  5  . loratadine (CLARITIN) 10 MG tablet Take 20 mg by mouth 2 (two) times daily as needed for allergies.       . Lutein-Zeaxanthin 25-5 MG CAPS Take 1 capsule by mouth daily.      . Multiple Vitamin (MULTIVITAMIN WITH MINERALS) TABS Take 1 tablet by mouth daily.      Marland Kitchen nystatin cream (MYCOSTATIN) Apply 1 application topically daily as needed (for itching).      . Omega-3 Fatty Acids (FISH OIL TRIPLE  STRENGTH) 1400 MG CAPS Take 1,200 mg by mouth daily.       Marland Kitchen oxybutynin (DITROPAN-XL) 10 MG 24 hr tablet Take 10 mg by mouth daily.      . pantoprazole (PROTONIX) 40 MG tablet Take 40 mg by mouth every morning.       . pregabalin (LYRICA) 200 MG capsule Take 1 capsule (200 mg total) by mouth every evening.  30 capsule  3  . propranolol (INDERAL) 40 MG tablet Take 1 tablet (40 mg total) by mouth 2 (two) times daily.  60 tablet  5  . simvastatin (ZOCOR) 20 MG tablet Take 20 mg by mouth every evening.        No  current facility-administered medications on file prior to visit.    Past Medical History  Diagnosis Date  . Family history of anesthesia complication     father has a severe hard time waking up  . Anxiety   . Mental disorder   . Chronic kidney disease     UTI'S  . GERD (gastroesophageal reflux disease)   . Headache(784.0)   . Arthritis   . Hard of hearing   . Migraine headache   . Obesity   . Depression   . Lateral epicondylitis of right elbow   . Dyslipidemia   . History of frequent urinary tract infections   . IBS (irritable bowel syndrome)   . Meralgia paresthetica of right side 10/02/2012    Past Surgical History  Procedure Laterality Date  . Joint replacement    . Knee arthroscopy Left 12/12  . Carpel tunnel rel Right 4/12  . Rotator cuff repair Left 6/11  . Plantar fascia release Right 12/10  . Bladder suspension  6/10  . Carpel tunnel left/right  7/08, 8/08 Bilateral 8/08 and 7/08  . Knee arthroscopy Right 12/06  . Tennis elbow release Right 7/04  . Abdominal hysterectomy  1/04    partial  . Cardiac catheterization  9/03  . Total knee arthroplasty Left 09/10/2012    Procedure: TOTAL KNEE ARTHROPLASTY- left;  Surgeon: Garald Balding, MD;  Location: [place] [facility];  Service: Orthopedics;  Laterality: Left;  Left total knee arthroplasty    History   Social History  . Marital Status: Married    Spouse Name: Remo Lipps    Number of Children: 2  . Years of Education: 16   Occupational History  .     Social History Main Topics  . Smoking status: Passive Smoke Exposure - Never Smoker -- 0.00 packs/day  . Smokeless tobacco: Never Used  . Alcohol Use: Yes     Comment: occasionally  . Drug Use: No  . Sexual Activity: Not on file   Other Topics Concern  . Not on file   Social History Narrative   Patient is married Remo Lipps) and lives at home with her husband and child.   Patient has two children.   Patient has a college education.   Patient is right-handed.    Patient drinks one cup of coffee, tea daily.    Family History  Problem Relation Age of Onset  . Hypertension Mother   . Hypertension Father   . Coronary artery disease Father   . Migraines Father   . Kidney failure Brother   . Hypertension Brother   . Migraines Brother   . Breast cancer Other     Niece with breast cancer  . Migraines Daughter     ROS: No blood in her urine or stool. No episodes  of syncope or transient neurological complaints. No history of hypertension. Progressive obesity. No endocrine abnormalities.. Other systems negative for complaints.  OBJECTIVE: BP 110/76  Pulse 61  Ht 5' 5.5" (1.664 m)  Wt 288 lb (130.636 kg)  BMI 47.18 kg/m2,  General: No acute distress, moderately obese HEENT: normal on schedule without jaundice or pallor Neck: JVD flat but difficult to evaluate. Carotids absent bruits Chest: Clear Cardiac: Murmur: None. Gallop: S4. Rhythm: Normal. Other: Normal Abdomen: Bruit: Absent. Pulsation: Absent Extremities: Edema: Absent. Pulses: 2+ and symmetric Neuro: Decreased hearing Psych: Normal  ECG: Normal  Echocardiogram: September 2015: Study Conclusions  - Left ventricle: The cavity size was normal. There was mild concentric hypertrophy. Systolic function was normal. The estimated ejection fraction was in the range of 55% to 60%. Images were inadequate for LV wall motion assessment. - Left atrium: The atrium was mildly dilated.

## 2014-01-15 ENCOUNTER — Telehealth: Payer: Self-pay

## 2014-01-15 NOTE — Telephone Encounter (Signed)
Message copied by Lamar Laundry on Thu Jan 15, 2014 10:09 AM ------      Message from: Daneen Schick      Created: Wed Jan 14, 2014  5:03 PM       No evidence for fluid buildup in lungs. ------

## 2014-01-15 NOTE — Telephone Encounter (Signed)
Message copied by Lamar Laundry on Thu Jan 15, 2014 11:05 AM ------      Message from: Daneen Schick      Created: Wed Jan 14, 2014  5:03 PM       No evidence for fluid buildup in lungs. ------

## 2014-01-15 NOTE — Telephone Encounter (Signed)
Pt aware of lab results.No evidence for fluid buildup in lungs.pt verbalized understanding.

## 2014-01-15 NOTE — Telephone Encounter (Signed)
called to give pt lab results.lmtcb

## 2014-01-23 ENCOUNTER — Ambulatory Visit (HOSPITAL_COMMUNITY): Payer: 59 | Attending: Cardiovascular Disease | Admitting: Radiology

## 2014-01-23 VITALS — BP 136/88 | Ht 65.0 in | Wt 288.0 lb

## 2014-01-23 DIAGNOSIS — R0602 Shortness of breath: Secondary | ICD-10-CM | POA: Insufficient documentation

## 2014-01-23 DIAGNOSIS — R0600 Dyspnea, unspecified: Secondary | ICD-10-CM | POA: Insufficient documentation

## 2014-01-23 DIAGNOSIS — E785 Hyperlipidemia, unspecified: Secondary | ICD-10-CM | POA: Diagnosis not present

## 2014-01-23 DIAGNOSIS — R079 Chest pain, unspecified: Secondary | ICD-10-CM | POA: Insufficient documentation

## 2014-01-23 MED ORDER — TECHNETIUM TC 99M SESTAMIBI GENERIC - CARDIOLITE
30.0000 | Freq: Once | INTRAVENOUS | Status: AC | PRN
  Administered 2014-01-23: 30 via INTRAVENOUS

## 2014-01-23 MED ORDER — REGADENOSON 0.4 MG/5ML IV SOLN
0.4000 mg | Freq: Once | INTRAVENOUS | Status: AC
  Administered 2014-01-23: 0.4 mg via INTRAVENOUS

## 2014-01-23 NOTE — Progress Notes (Signed)
[facility] [address] [place], [place] [address] [phone]    Cardiology Nuclear Med Study  Heather Frederick is a 58 y.o. female     MRN : [id number]     DOB: [birth year]  Procedure Date: 01/23/2014  Nuclear Med Background Indication for Stress Test:  Evaluation for Ischemia History:  '12 MPI: False positive per cath report Cardiac Risk Factors: Lipids  Symptoms:  Chest Pain at Rest and with Exertion (last date of chest discomfort 2 days ago), DOE and SOB   Nuclear Pre-Procedure Caffeine/Decaff Intake:  8:00pm NPO After: 8:00pm   Lungs:  clear O2 Sat: 92% on room air. IV 0.9% NS with Angio Cath:  22g  IV Site: R Wrist  IV Started by:  Matilde Haymaker, RN  Chest Size (in):  40 Cup Size: DD  Height: 5\' 5"  (1.651 m)  Weight:  288 lb (130.636 kg)  BMI:  Body mass index is 47.93 kg/(m^2). Tech Comments:  No Inderal x 24 hrs    Nuclear Med Study 1 or 2 day study: 2 day  Stress Test Type:  Lexiscan  Reading MD: n/a  Order Authorizing Provider:  Wyvonnia Lora  Resting Radionuclide: Technetium 36m Sestamibi  Resting Radionuclide Dose: 33.0 mCi on 01/26/14   Stress Radionuclide:  Technetium 91m Sestamibi  Stress Radionuclide Dose: 33.0 mCi on 01/23/14           Stress Protocol Rest HR: 71 Stress HR: 92  Rest BP: 136/88 Stress BP: 131/94  Exercise Time (min): n/a METS: n/a   Predicted Max HR: 164 bpm % Max HR: 56.1 bpm Rate Pressure Product: 12512   Dose of Adenosine (mg):  n/a Dose of Lexiscan: 0.4 mg  Dose of Atropine (mg): n/a Dose of Dobutamine: n/a mcg/kg/min (at max HR)  Stress Test Technologist: Matilde Haymaker, RN  Nuclear Technologist:  Earl Many, CNMT     Rest Procedure:  Myocardial perfusion imaging was performed at rest 45 minutes following the intravenous administration of Technetium 53m Sestamibi. Rest ECG: NSR - Normal EKG  Stress Procedure:  The patient received IV Lexiscan 0.4 mg over 15-seconds. Patient had  chest tightness,dyspnea with infusion. Relieved during recovery.  Technetium 64m Sestamibi injected at 30-seconds.  Quantitative spect images were obtained after a 45 minute delay. Stress ECG: No significant change from baseline ECG  QPS Raw Data Images:  There is interference from nuclear activity from structures below the diaphragm. This does affect the ability to read the study. There is also marked breast attenuation. Stress Images:  there is a small area of inferoapical reduction in tracer uptake Rest Images:  the inferoapical perfusion appears to improve, but this apparent change may be due to interference from visceral tracer uptake. Subtraction (SDS):  possible ischemia versus artifact Transient Ischemic Dilatation (Normal <1.22):  0.93 Lung/Heart Ratio (Normal <0.45):  0.22  Quantitative Gated Spect Images QGS EDV:  82 ml QGS ESV:  32 ml  Impression Exercise Capacity:  Lexiscan with no exercise. BP Response:  Normal blood pressure response. Clinical Symptoms:  No significant symptoms noted. ECG Impression:  No significant ST segment change suggestive of ischemia. Comparison with Prior Nuclear Study: No images to compare  Overall Impression:  Low risk stress nuclear study with anterior breast attenuation artifact and possible small area of inferoapical ischemia. Technically limited study.Study specificity is limited by significant interference from infradiaphragmatic visceral uptake, especially on rest images.  LV Ejection Fraction: 61%.  LV Wall Motion:  NL  LV Function; NL Wall Motion    Sanda Klein, MD, [facility] [phone] office [phone] pager

## 2014-01-26 ENCOUNTER — Ambulatory Visit (HOSPITAL_COMMUNITY): Payer: 59 | Attending: Cardiovascular Disease

## 2014-01-26 DIAGNOSIS — R0989 Other specified symptoms and signs involving the circulatory and respiratory systems: Secondary | ICD-10-CM

## 2014-01-26 MED ORDER — TECHNETIUM TC 99M SESTAMIBI GENERIC - CARDIOLITE
33.0000 | Freq: Once | INTRAVENOUS | Status: AC | PRN
  Administered 2014-01-26: 33 via INTRAVENOUS

## 2014-01-29 ENCOUNTER — Other Ambulatory Visit: Payer: Self-pay

## 2014-01-29 DIAGNOSIS — Z1231 Encounter for screening mammogram for malignant neoplasm of breast: Secondary | ICD-10-CM

## 2014-01-30 ENCOUNTER — Telehealth: Payer: Self-pay | Admitting: Interventional Cardiology

## 2014-01-30 DIAGNOSIS — R0683 Snoring: Secondary | ICD-10-CM

## 2014-01-30 NOTE — Telephone Encounter (Signed)
-----   Message from [place], MD sent at 01/28/2014  1:42 PM EST ----- Low risk study with no areas of clinical concern. Please schedule sleep study

## 2014-01-30 NOTE — Telephone Encounter (Signed)
New Message       Pt calling stating that she never received a call in regards to the results of her stress test. Please call pt back and advise.

## 2014-01-30 NOTE — Telephone Encounter (Signed)
Pt aware of lexiscan results.  Low risk study with no areas of clinical concern. Please schedule sleep study. Pt adv that a scheduler from our office will call her to schedule her sleep study. Pt verbalized understanding.

## 2014-02-03 ENCOUNTER — Encounter: Payer: Self-pay | Admitting: *Deleted

## 2014-02-03 ENCOUNTER — Telehealth: Payer: Self-pay | Admitting: *Deleted

## 2014-02-03 NOTE — Telephone Encounter (Signed)
Could not get in touch with patient so mailed a letter about patient needing to reschedule due to cm admin time.

## 2014-02-16 ENCOUNTER — Encounter: Payer: Self-pay | Admitting: Interventional Cardiology

## 2014-02-16 ENCOUNTER — Ambulatory Visit: Payer: 59 | Admitting: Interventional Cardiology

## 2014-02-16 ENCOUNTER — Ambulatory Visit (INDEPENDENT_AMBULATORY_CARE_PROVIDER_SITE_OTHER): Payer: 59 | Admitting: Interventional Cardiology

## 2014-02-16 ENCOUNTER — Encounter: Payer: Self-pay | Admitting: Internal Medicine

## 2014-02-16 ENCOUNTER — Telehealth: Payer: Self-pay

## 2014-02-16 ENCOUNTER — Ambulatory Visit (INDEPENDENT_AMBULATORY_CARE_PROVIDER_SITE_OTHER): Payer: 59 | Admitting: Internal Medicine

## 2014-02-16 VITALS — BP 118/64 | HR 71 | Temp 98.1°F | Resp 20 | Ht 65.0 in | Wt 289.4 lb

## 2014-02-16 VITALS — BP 98/80 | HR 62 | Ht 65.0 in | Wt 296.4 lb

## 2014-02-16 DIAGNOSIS — R0600 Dyspnea, unspecified: Secondary | ICD-10-CM

## 2014-02-16 DIAGNOSIS — R21 Rash and other nonspecific skin eruption: Secondary | ICD-10-CM | POA: Insufficient documentation

## 2014-02-16 MED ORDER — DOXYCYCLINE HYCLATE 100 MG PO TABS: 100.0000 mg | ORAL_TABLET | Freq: Two times a day (BID) | ORAL | Status: DC

## 2014-02-16 NOTE — Progress Notes (Signed)
   Subjective:    Patient ID: Heather Frederick, female    DOB: 17-May-1955, 58 y.o.   MRN: [id number]  HPI The patient is a 58 YO female who comes in today for rash on her face. Is on her left nostril and started off with what looked to be a blister. She treated with fever blister cream. This did not help. The area has become hard and reddened. There is mild amount of swelling. She has some congestion in that nostril as well however is unable to blow her nose given the pain on the side of the nostril. It is tender to touch. She denies fevers chills, weight loss.  Review of Systems  Constitutional: Negative for fever, chills, activity change, appetite change, fatigue and unexpected weight change.  HENT: Positive for congestion and facial swelling.   Respiratory: Negative.   Cardiovascular: Negative for chest pain, palpitations and leg swelling.  Gastrointestinal: Negative.   Skin: Positive for rash.  Neurological: Negative.       Objective:   Physical Exam  Constitutional: She appears well-developed and well-nourished.  Overweight  HENT:  Head: Normocephalic and atraumatic.  Right Ear: External ear normal.  Left Ear: External ear normal.  Mouth/Throat: Oropharynx is clear and moist.  Inside the nares are both normal with minimal amounts of drainage.  Eyes: EOM are normal.  Neck: Normal range of motion.  Cardiovascular: Normal rate and regular rhythm.   Pulmonary/Chest: Effort normal and breath sounds normal.  Abdominal: Soft. Bowel sounds are normal.  Skin: Skin is warm and dry.  Hard firm area on the left side of the outside nostril with no expressible drainage. Tender to touch. Mild amount of swelling on the left cheek. No periorbital swelling   Filed Vitals:   02/16/14 1111  BP: 118/64  Pulse: 71  Temp: 98.1 F (36.7 C)  TempSrc: Oral  Resp: 20  Height: 5\' 5"  (1.651 m)  Weight: 289 lb 6.4 oz (131.271 kg)  SpO2: 97%      Assessment & Plan:

## 2014-02-16 NOTE — Assessment & Plan Note (Signed)
Appears to be infected acne/rosacea appearing area on the left nostril. Will treat with doxycycline for 10 days to see if there is improvement. She will also keep appointment with her dermatologist in January. Advise she continues Neosporin ointment on that surface. She can also use warm compresses.

## 2014-02-16 NOTE — Progress Notes (Signed)
Pre visit review using our clinic review tool, if applicable. No additional management support is needed unless otherwise documented below in the visit note. 

## 2014-02-16 NOTE — Progress Notes (Signed)
Patient ID: Heather Frederick, female   DOB: 1955-04-20, 58 y.o.   MRN: [id number]    [address], [place]  [address] Phone: [phone] Fax:  [phone]  Date:  02/16/2014   ID:  Heather, Frederick 11-12-1955, MRN [id number]  PCP:  Olga Millers, MD   ASSESSMENT:  1. Recurring chest discomfort. 2 episodes at rest. The initial episode is what led to the myocardial perfusion study. The day between her 2 day myocardial perfusion study she had a no the prolonged episode of chest discomfort. She has had none since that time. 2. Severe dyspnea on exertion 3. Morbid obesity 4. Gastroesophageal reflux 5. Anxiety disorder  PLAN:  1. CT angiogram of the lungs to rule out pulmonary embolism 2. Consider coronary angiography if continued recurrent episodes of chest discomfort 3. If chest pain continues, she will need coronary angiography despite a low risk myocardial perfusion study 4. Decreased caloric intake to facilitate weight loss 5. Sleep study to rule out sleep apnea   SUBJECTIVE: Heather Frederick is a 58 y.o. female who has severe dyspnea on exertion. A recent pulmonary function test that revealed decreased diffusion capacity. She has had 2 episodes of prolonged chest discomfort. The first episodes was spurting myocardial perfusion study. Perfusion study showed a small apical defect and a large anterior wall defect both of which was felt to represent soft tissue attenuation. LV function was normal. There is no exertional chest discomfort. She is markedly limited by exertional dyspnea. She has gained significant weight over the past year. She now weighs nearly 300 pounds. She admits that she has been depressed and his been over eating. She denies orthopnea. Simple activities such as getting into her car cause excessive shortness of breath.  Wt Readings from Last 3 Encounters:  02/16/14 296 lb 6.4 oz (134.446 kg)  02/16/14 289 lb 6.4 oz (131.271 kg)  01/23/14 288  lb (130.636 kg)     Past Medical History  Diagnosis Date  . Family history of anesthesia complication     father has a severe hard time waking up  . Anxiety   . Mental disorder   . Chronic kidney disease     UTI'S  . GERD (gastroesophageal reflux disease)   . Headache(784.0)   . Arthritis   . Hard of hearing   . Migraine headache   . Obesity   . Depression   . Lateral epicondylitis of right elbow   . Dyslipidemia   . History of frequent urinary tract infections   . IBS (irritable bowel syndrome)   . Meralgia paresthetica of right side 10/02/2012    Current Outpatient Prescriptions  Medication Sig Dispense Refill  . acetaminophen (TYLENOL) 500 MG tablet Take 1,000 mg by mouth 2 (two) times daily as needed for pain.    . Cholecalciferol (VITAMIN D-3) 1000 UNITS CAPS Take by mouth.    . citalopram (CELEXA) 20 MG tablet Take 20 mg by mouth every evening.      . clotrimazole-betamethasone (LOTRISONE) cream     . Cranberry (HM CRANBERRY SUPER STRENGTH) 300 MG tablet Take 300 mg by mouth 2 (two) times daily.    . Cyanocobalamin (VITAMIN B-12) 5000 MCG SUBL Place 2,500 mcg under the tongue daily.     Marland Kitchen eletriptan (RELPAX) 40 MG tablet Take 1 tablet (40 mg total) by mouth every 2 (two) hours as needed for migraine. 8 tablet 5  . loratadine (CLARITIN) 10 MG tablet Take 20  mg by mouth 2 (two) times daily as needed for allergies.     . Lutein-Zeaxanthin 25-5 MG CAPS Take 1 capsule by mouth daily.    . Multiple Vitamin (MULTIVITAMIN WITH MINERALS) TABS Take 1 tablet by mouth daily.    Marland Kitchen nystatin cream (MYCOSTATIN) Apply 1 application topically daily as needed (for itching).    . Omega-3 Fatty Acids (FISH OIL TRIPLE STRENGTH) 1400 MG CAPS Take 1,200 mg by mouth daily.     Marland Kitchen oxybutynin (DITROPAN-XL) 10 MG 24 hr tablet Take 10 mg by mouth daily.    . pantoprazole (PROTONIX) 40 MG tablet Take 40 mg by mouth every morning.     . pregabalin (LYRICA) 200 MG capsule Take 1 capsule (200 mg total) by  mouth every evening. 30 capsule 3  . propranolol (INDERAL) 40 MG tablet Take 1 tablet (40 mg total) by mouth 2 (two) times daily. (Patient taking differently: Take 40 mg by mouth daily. ) 60 tablet 5  . simvastatin (ZOCOR) 20 MG tablet Take 20 mg by mouth every evening.      No current facility-administered medications for this visit.    Allergies:    Allergies  Allergen Reactions  . Topamax [Topiramate] Other (See Comments)    :Stroke like symptoms  . Advil [Ibuprofen] Hives  . Aleve [Naproxen Sodium] Hives  . Bee Venom Swelling  . Echinacea Hives  . Other Other (See Comments)    Feathers cause sinus congestion  . Sulfa Antibiotics Hives    Social History:  The patient  reports that she has been passively smoking.  She has never used smokeless tobacco. She reports that she drinks alcohol. She reports that she does not use illicit drugs.   ROS:  Please see the history of present illness.   Appetite is excessive. She denies we come chest discomfort. No prolonged palpitations. No peripheral edema. No history of DVT or pulmonary emboli. She snores. She doesn't sleep well.   All other systems reviewed and negative.   OBJECTIVE: VS:  BP 98/80 mmHg  Pulse 62  Ht 5\' 5"  (1.651 m)  Wt 296 lb 6.4 oz (134.446 kg)  BMI 49.32 kg/m2 Well nourished, well developed, in no acute distress, morbidly obese HEENT: normal Neck: JVD difficult to assess. Carotid bruit 2+  Cardiac:  normal S1, S2; RRR; no murmur Lungs:  clear to auscultation bilaterally, no wheezing, rhonchi or rales Abd: soft, nontender, no hepatomegaly Ext: Edema  Absentlses 2+Skin: warm and dry Neuro:  CNs 2-12 intact, no focal abnormalities noted  EKG:  Not repeated       Signed, Illene Labrador III, MD 02/16/2014 12:50 PM

## 2014-02-16 NOTE — Telephone Encounter (Signed)
Pt aware that her insurance is requesting she have a d-dimer lab done, before approval will be given for pt CTA. Pt will come to the office early on 11/24 to have a stat d-dimer.pt adv if CTA not approved by her insurance our precert dept.will contact her.pt verbalized understanding.

## 2014-02-16 NOTE — Patient Instructions (Addendum)
Your physician recommends that you continue on your current medications as directed. Please refer to the Current Medication list given to you today.  Non-Cardiac CT Angiography (CTA), is a special type of CT scan that uses a computer to produce multi-dimensional views of major blood vessels throughout the body. In CT angiography, a contrast material is injected through an IV to help visualize the blood vessels  Your physician recommends that you schedule a follow-up appointment as needed

## 2014-02-16 NOTE — Patient Instructions (Signed)
We will have you take an antibiotic for your nose. It is called doxycycline. Take 1 pill twice a day for 10 days. It can occasionally give people stomach upset so we advised to take it with food to decrease side effects but if you need to take it without food that is okay.

## 2014-02-17 ENCOUNTER — Telehealth: Payer: Self-pay

## 2014-02-17 ENCOUNTER — Other Ambulatory Visit (INDEPENDENT_AMBULATORY_CARE_PROVIDER_SITE_OTHER): Payer: 59 | Admitting: *Deleted

## 2014-02-17 ENCOUNTER — Ambulatory Visit (INDEPENDENT_AMBULATORY_CARE_PROVIDER_SITE_OTHER)
Admission: RE | Admit: 2014-02-17 | Discharge: 2014-02-17 | Disposition: A | Discharge status: Home / Self Care | Payer: 59 | Source: Ambulatory Visit | Attending: Interventional Cardiology | Admitting: Interventional Cardiology

## 2014-02-17 DIAGNOSIS — R0600 Dyspnea, unspecified: Secondary | ICD-10-CM

## 2014-02-17 LAB — BASIC METABOLIC PANEL
BUN: 15 mg/dL (ref 6–23)
CALCIUM: 9.5 mg/dL (ref 8.4–10.5)
CO2: 27 mEq/L (ref 19–32)
Chloride: 104 mEq/L (ref 96–112)
Creatinine, Ser: 0.9 mg/dL (ref 0.4–1.2)
GFR: 67.03 mL/min (ref >60.00)
Glucose, Bld: 81 mg/dL (ref 70–99)
Potassium: 4.2 mEq/L (ref 3.5–5.1)
Sodium: 140 mEq/L (ref 135–145)

## 2014-02-17 LAB — D-DIMER, QUANTITATIVE (NOT AT ARMC): D DIMER QUANT: 0.69 ug{FEU}/mL — AB (ref 0.00–0.48)

## 2014-02-17 MED ORDER — IOHEXOL 350 MG/ML SOLN
100.0000 mL | Freq: Once | INTRAVENOUS | Status: AC | PRN
  Administered 2014-02-17: 100 mL via INTRAVENOUS

## 2014-02-17 NOTE — Telephone Encounter (Signed)
pt aware of lab and ct results.No blood clots. Small gall stones are likely.   Copy report to PCP  .pt verbalized understanding.

## 2014-02-17 NOTE — Telephone Encounter (Signed)
-----   Message from Sinclair Grooms, MD sent at 02/17/2014  4:38 PM EST ----- No blood clots. Small gall stones are likely. Copy report to PCP

## 2014-02-18 ENCOUNTER — Telehealth: Payer: Self-pay | Admitting: Internal Medicine

## 2014-02-18 ENCOUNTER — Ambulatory Visit: Payer: 59 | Admitting: Internal Medicine

## 2014-02-18 NOTE — Telephone Encounter (Signed)
I spoke with patient and she is concerned because the rash on her nose is spreading to her eye. Please advise, thanks.

## 2014-02-18 NOTE — Telephone Encounter (Signed)
I would advise that she continue taking antibiotics. I would also recommend that she call her eye doctor to be seen there today urgently.

## 2014-02-18 NOTE — Telephone Encounter (Signed)
Patient says rash is not better and it is spreading to her eye.

## 2014-02-18 NOTE — Telephone Encounter (Signed)
Patient states the rash on her face has not gotten better.  She would like a call back in regards.  Patient states its now close to her eye.

## 2014-02-24 ENCOUNTER — Ambulatory Visit: Payer: Self-pay | Admitting: Nurse Practitioner

## 2014-02-25 ENCOUNTER — Ambulatory Visit
Admission: RE | Admit: 2014-02-25 | Discharge: 2014-02-25 | Disposition: A | Discharge status: Home / Self Care | Payer: 59 | Source: Ambulatory Visit

## 2014-02-25 DIAGNOSIS — Z1231 Encounter for screening mammogram for malignant neoplasm of breast: Secondary | ICD-10-CM

## 2014-03-09 ENCOUNTER — Encounter: Payer: Self-pay | Admitting: Physician Assistant

## 2014-03-09 ENCOUNTER — Other Ambulatory Visit: Payer: Self-pay | Admitting: Physician Assistant

## 2014-03-09 ENCOUNTER — Telehealth: Payer: Self-pay | Admitting: Interventional Cardiology

## 2014-03-09 ENCOUNTER — Ambulatory Visit (INDEPENDENT_AMBULATORY_CARE_PROVIDER_SITE_OTHER): Payer: 59 | Admitting: Physician Assistant

## 2014-03-09 VITALS — BP 128/84 | HR 71 | Ht 65.0 in | Wt 299.2 lb

## 2014-03-09 DIAGNOSIS — G43909 Migraine, unspecified, not intractable, without status migrainosus: Secondary | ICD-10-CM | POA: Insufficient documentation

## 2014-03-09 DIAGNOSIS — K8020 Calculus of gallbladder without cholecystitis without obstruction: Secondary | ICD-10-CM

## 2014-03-09 DIAGNOSIS — R072 Precordial pain: Secondary | ICD-10-CM

## 2014-03-09 DIAGNOSIS — E785 Hyperlipidemia, unspecified: Secondary | ICD-10-CM

## 2014-03-09 DIAGNOSIS — I5030 Unspecified diastolic (congestive) heart failure: Secondary | ICD-10-CM | POA: Insufficient documentation

## 2014-03-09 DIAGNOSIS — K219 Gastro-esophageal reflux disease without esophagitis: Secondary | ICD-10-CM | POA: Insufficient documentation

## 2014-03-09 LAB — TROPONIN I: Troponin I: <0.3 ng/mL (ref <0.30)

## 2014-03-09 MED ORDER — ASPIRIN EC 81 MG PO TBEC: 81.0000 mg | DELAYED_RELEASE_TABLET | Freq: Every day | ORAL | Status: DC

## 2014-03-09 MED ORDER — NITROGLYCERIN 0.4 MG SL SUBL: 0.4000 mg | SUBLINGUAL_TABLET | SUBLINGUAL | Status: DC | PRN

## 2014-03-09 NOTE — Progress Notes (Addendum)
[place], [place]  [address] Phone: [phone] Fax:  [phone]  Date:  03/09/2014   Patient ID:  Heather Frederick, Heather Frederick October 18, 1955, MRN [id number]   PCP:  Olga Millers, MD  Cardiologist: Linard Millers   History of Present Illness: Heather Frederick is a 58 y.o. female with history of ?dyslipidemia, morbid obesity, migraine headaches, and family history of CAD who presents for follow-up evaluation of chest pain. She was recently evaluated by Dr. Tamala Julian for 6 months of progressive dyspnea and fleeting chest pain. She has h/o remote cath 2003 that was unremarkable. Pulmonary function testing done by her primary physician demonstrated a diffusion abnormality suggestive of CHF. However, 2D echo 10/2013: mild LVH, EF 55-60%, mildly dilated LA and pBNP totally normal 01/13/14. She underwent nuc 01/23/14: "Low risk stress nuclear study with anterior breast attenuation artifact and possible small area of inferoapical ischemia. Technically limited study.Study specificity is limited by significant interference from infradiaphragmatic visceral uptake, especially on rest images. LV Ejection Fraction: 61%." D-dimer was mildly elevated at 0.69 and f/u CTA 01/2014 showed: "no evidence of PE, mosaic profusion of the lungs may indicate small airways disease or perfusion abnormalities, question small gallstones within the gallbladder." Dr. Tamala Julian recommended to consider coronary angiography if she has recurrent episodes of chest pain. She is pending outpatient sleep study. One brother was 31 when he had CABG; father had CABG in his 42s.   She comes in today for continued intermittent symptoms of chest pain. She is now having chest pain/pressure on a daily basis. It has a tendency to move to the left side of her chest. She also feels like it moves up to her throat like someone is choking her. It lasts minutes mostly but has lasted for hours before. She denies associated nausea, vomiting,  diaphoresis, LEE, syncope. She denies palpitations but reports feeling jittery with this, like something isn't right. The pain is not worse after eating. She thinks there might be a slight increase in discomfort when walking around but is not totally sure since activity is limited by OA of the knees. In general the only thing that makes the pain worse is thinking about it. She had an increased episode earlier today for about 1.5 hours. She currently rates the discomfort at 1/10 - a general awareness of it. EKG is nonacute.  Nuclear Stress Test 01/23/14: Overall Impression: Low risk stress nuclear study with anterior breast attenuation artifact and possible small area of inferoapical ischemia. Technically limited study.Study specificity is limited by significant interference from infradiaphragmatic visceral uptake, especially on rest images. LV Ejection Fraction: 61%. LV Wall Motion: NL LV Function; NL Wall Motion    Recent Labs: 01/13/2014: Pro B Natriuretic peptide (BNP) 10.0 02/16/2014: Creatinine 0.9; Potassium 4.2  Wt Readings from Last 3 Encounters:  03/09/14 299 lb 3.2 oz (135.716 kg)  02/16/14 296 lb 6.4 oz (134.446 kg)  02/16/14 289 lb 6.4 oz (131.271 kg)     Past Medical History  Diagnosis Date  . Family history of anesthesia complication     father has a severe hard time waking up  . Anxiety   . Mental disorder   . GERD (gastroesophageal reflux disease)   . Arthritis   . Hard of hearing   . Migraine headache   . Obesity   . Depression   . Lateral epicondylitis of right elbow   . Dyslipidemia   . History of frequent urinary tract infections   .  IBS (irritable bowel syndrome)   . Meralgia paresthetica of right side 10/02/2012  . Osteoarthritis   . Gallstones     a. Seen on CT 01/2014.  Marland Kitchen Dyspnea     a. Echo 10/2013: mild LVH, EF 55-60%, mildly dilated LA. b. pBNP normal 12/2013. c. Nuc 12/2013: low risk with anterior breast attenuation and possible small area of  inferoapical ischemia. Technically limited. EF 61%. d. CTA 01/2014: no PE. Mosiac perfusion of lungs may indiate small airways disease or perfusion abnormalities. Question small gallstones within gb."    Current Outpatient Prescriptions  Medication Sig Dispense Refill  . acetaminophen (TYLENOL) 500 MG tablet Take 1,000 mg by mouth 2 (two) times daily as needed for pain.    . Cholecalciferol (VITAMIN D-3) 1000 UNITS CAPS Take by mouth.    . citalopram (CELEXA) 20 MG tablet Take 20 mg by mouth every evening.      . Cranberry (HM CRANBERRY SUPER STRENGTH) 300 MG tablet Take 300 mg by mouth 2 (two) times daily.    . Cyanocobalamin (VITAMIN B-12) 5000 MCG SUBL Place 2,500 mcg under the tongue daily.     Marland Kitchen eletriptan (RELPAX) 40 MG tablet Take 1 tablet (40 mg total) by mouth every 2 (two) hours as needed for migraine. 8 tablet 5  . loratadine (CLARITIN) 10 MG tablet Take 20 mg by mouth 2 (two) times daily as needed for allergies.     . Lutein-Zeaxanthin 25-5 MG CAPS Take 1 capsule by mouth daily.    . Multiple Vitamin (MULTIVITAMIN WITH MINERALS) TABS Take 1 tablet by mouth daily.    Marland Kitchen nystatin cream (MYCOSTATIN) Apply 1 application topically daily as needed (for itching).    . Omega-3 Fatty Acids (FISH OIL TRIPLE STRENGTH) 1400 MG CAPS Take 1,200 mg by mouth daily.     Marland Kitchen oxybutynin (DITROPAN-XL) 10 MG 24 hr tablet Take 10 mg by mouth daily.    . pantoprazole (PROTONIX) 40 MG tablet Take 40 mg by mouth every morning.     . pregabalin (LYRICA) 200 MG capsule Take 1 capsule (200 mg total) by mouth every evening. 30 capsule 3  . propranolol (INDERAL) 40 MG tablet Take 1 tablet (40 mg total) by mouth 2 (two) times daily. (Patient taking differently: Take 40 mg by mouth daily. ) 60 tablet 5  . simvastatin (ZOCOR) 20 MG tablet Take 20 mg by mouth every evening.      No current facility-administered medications for this visit.    Allergies:   Topamax; Advil; Aleve; Bee venom; Echinacea; Other; and Sulfa  antibiotics   Social History:  The patient  reports that she has been passively smoking.  She has never used smokeless tobacco. She reports that she drinks alcohol. She reports that she does not use illicit drugs.   Family History:  The patient's family history includes Breast cancer in her other; Coronary artery disease in her father; Hypertension in her brother, father, and mother; Kidney failure in her brother; Migraines in her brother, daughter, and father; Stroke in her mother.   ROS:  Please see the history of present illness.    All other systems reviewed and negative.   PHYSICAL EXAM:  VS:  BP 128/84 mmHg  Pulse 71  Ht 5\' 5"  (1.651 m)  Wt 299 lb 3.2 oz (135.716 kg)  BMI 49.79 kg/m2 Well nourished, well developed obese WF, in no acute distress HEENT: normal Neck: no JVD, no carotid bruits Cardiac:  normal S1, S2; RRR; no murmur Lungs:  clear to auscultation bilaterally, no wheezing, rhonchi or rales Abd: soft, nontender, no hepatomegaly Ext: no edema Skin: warm and dry Neuro:  moves all extremities spontaneously, no focal abnormalities noted  EKG:  NSR 71bpm, no acute ST-T changes   ASSESSMENT AND PLAN:  1. Precordial chest pain/dyspnea 2. ? Possible prior history of dyslipidemia, on Zocor 3. Morbid obesity BMI Body mass index is 49.79 kg/(m^2). 4. Gallstones seen on CT 01/2014 5. Secondhand smoke exposure in early lifetime  Per review of chart, Dr. Tamala Julian felt that if chest pain were to continue that she would need coronary angiography despite a low risk myocardial perfusion study. I think this is appropriate and the patient agrees. Cardiac risk factors include obesity, possible dyslipidemia (on Zocor - lipid status unclear), family history of CAD, and remote passive tobacco exposure. I discussed the possibility of inpatient vs. outpatient evaluation with Dr. Mare Ferrari. Given low-grade symptoms, low risk nuclear stress test, and normal EKG he feels that having her come back  in for outpatient cath in the AM is acceptable. Given her symptoms of 1.5 hours today, will check a stat troponin. If abnormal, she will need to go to the ER and she is aware of this. If negative, will plan on outpatient cardiac cath tomorrow with Dr. Tamala Julian. I will sign this out to our on-call PA to be on the look-out. Risks and benefits of cardiac catheterization have been discussed with the patient. These include bleeding, infection, kidney damage, stroke, heart attack, death. The patient understands these risks and is willing to proceed. Will start ASA 81mg  and rx NTG prn. Will also check pre-cath labs of CMET, CBC, PT/INR. Will have her fasting lipids checked in the AM. If cardiac cath is negative, she should f/u with her PCP to further evaluate for GI etiology including small cholelithasis seen on recent CT. ER precautions reviewed.  Note: she has h/o allergy to ibuprofen and naproxen in her chart with hives, but states this was around a time when everything seemed to give her hives so she is not sure if it was a true allergy. She has been able to use voltaren gel without any allergic reaction and has also been able to take aspirin in the past without any complications or side effects.  Dispo: F/u 2 weeks after catheterization with Dr. Tamala Julian or me/APP.  Signed, Melina Copa, PA-C  03/09/2014 3:48 PM

## 2014-03-09 NOTE — Patient Instructions (Addendum)
Your physician has recommended you make the following change in your medication:  1) START Aspirin 81mg  daily 2) START Nitro-glycerin as needed. Take as directed. An Rx has been sent to your pharmacy  Lab Today: Cmet, Cbc, Troponin, Pt/Inr  Your physician has requested that you have a cardiac catheterization. Cardiac catheterization is used to diagnose and/or treat various heart conditions. Doctors may recommend this procedure for a number of different reasons. The most common reason is to evaluate chest pain. Chest pain can be a symptom of coronary artery disease (CAD), and cardiac catheterization can show whether plaque is narrowing or blocking your heart's arteries. This procedure is also used to evaluate the valves, as well as measure the blood flow and oxygen levels in different parts of your heart. For further information please visit [email or web address]. Please follow instruction sheet, as given.  Your physician recommends that you schedule a follow-up appointment in: 2 weeks with Heather Dunn,PA

## 2014-03-09 NOTE — Telephone Encounter (Signed)
Patient calls with chest tightness that goes up to her neck States having these episodes since she las t saw Dr. Tamala Julian. No nausea, no sweating. Minimal lightheadedness. Desires an appointment. Spoke with Dayna, who said she would be happy to see her Appointment today at 1:45 pm.

## 2014-03-09 NOTE — Telephone Encounter (Signed)
New message         C/o chest tightness

## 2014-03-10 ENCOUNTER — Encounter (HOSPITAL_COMMUNITY): Payer: Self-pay | Admitting: Interventional Cardiology

## 2014-03-10 ENCOUNTER — Ambulatory Visit (HOSPITAL_COMMUNITY)
Admission: RE | Admit: 2014-03-10 | Discharge: 2014-03-10 | Disposition: A | Discharge status: Home / Self Care | Payer: 59 | Source: Ambulatory Visit | Attending: Interventional Cardiology | Admitting: Interventional Cardiology

## 2014-03-10 ENCOUNTER — Encounter (HOSPITAL_COMMUNITY)
Admission: RE | Disposition: A | Discharge status: Home / Self Care | Payer: Self-pay | Source: Ambulatory Visit | Attending: Interventional Cardiology

## 2014-03-10 ENCOUNTER — Telehealth: Payer: Self-pay | Admitting: *Deleted

## 2014-03-10 DIAGNOSIS — Z79899 Other long term (current) drug therapy: Secondary | ICD-10-CM | POA: Insufficient documentation

## 2014-03-10 DIAGNOSIS — R079 Chest pain, unspecified: Secondary | ICD-10-CM

## 2014-03-10 DIAGNOSIS — G43909 Migraine, unspecified, not intractable, without status migrainosus: Secondary | ICD-10-CM | POA: Diagnosis not present

## 2014-03-10 DIAGNOSIS — I209 Angina pectoris, unspecified: Secondary | ICD-10-CM

## 2014-03-10 DIAGNOSIS — Z87442 Personal history of urinary calculi: Secondary | ICD-10-CM | POA: Insufficient documentation

## 2014-03-10 DIAGNOSIS — K589 Irritable bowel syndrome without diarrhea: Secondary | ICD-10-CM | POA: Diagnosis not present

## 2014-03-10 DIAGNOSIS — M1990 Unspecified osteoarthritis, unspecified site: Secondary | ICD-10-CM | POA: Insufficient documentation

## 2014-03-10 DIAGNOSIS — E785 Hyperlipidemia, unspecified: Secondary | ICD-10-CM | POA: Insufficient documentation

## 2014-03-10 DIAGNOSIS — R0789 Other chest pain: Secondary | ICD-10-CM | POA: Diagnosis not present

## 2014-03-10 DIAGNOSIS — F329 Major depressive disorder, single episode, unspecified: Secondary | ICD-10-CM | POA: Insufficient documentation

## 2014-03-10 DIAGNOSIS — F99 Mental disorder, not otherwise specified: Secondary | ICD-10-CM | POA: Diagnosis not present

## 2014-03-10 DIAGNOSIS — K219 Gastro-esophageal reflux disease without esophagitis: Secondary | ICD-10-CM | POA: Diagnosis not present

## 2014-03-10 DIAGNOSIS — R0600 Dyspnea, unspecified: Secondary | ICD-10-CM

## 2014-03-10 DIAGNOSIS — Z6841 Body Mass Index (BMI) 40.0 and over, adult: Secondary | ICD-10-CM | POA: Insufficient documentation

## 2014-03-10 DIAGNOSIS — I5030 Unspecified diastolic (congestive) heart failure: Secondary | ICD-10-CM | POA: Diagnosis present

## 2014-03-10 DIAGNOSIS — R072 Precordial pain: Secondary | ICD-10-CM

## 2014-03-10 HISTORY — PX: LEFT HEART CATHETERIZATION WITH CORONARY ANGIOGRAM: SHX5451

## 2014-03-10 LAB — CBC WITH DIFFERENTIAL/PLATELET
BASOS PCT: 0.5 % (ref 0.0–3.0)
Basophils Absolute: 0 10*3/uL (ref 0.0–0.1)
EOS PCT: 2.5 % (ref 0.0–5.0)
Eosinophils Absolute: 0.2 10*3/uL (ref 0.0–0.7)
HCT: 42 % (ref 36.0–46.0)
Hemoglobin: 13.8 g/dL (ref 12.0–15.0)
LYMPHS ABS: 2.6 10*3/uL (ref 0.7–4.0)
Lymphocytes Relative: 38.8 % (ref 12.0–46.0)
MCHC: 32.8 g/dL (ref 30.0–36.0)
MCV: 90.5 fl (ref 78.0–100.0)
MONOS PCT: 8.7 % (ref 3.0–12.0)
Monocytes Absolute: 0.6 10*3/uL (ref 0.1–1.0)
NEUTROS ABS: 3.3 10*3/uL (ref 1.4–7.7)
Neutrophils Relative %: 49.5 % (ref 43.0–77.0)
Platelets: 181 10*3/uL (ref 150.0–400.0)
RBC: 4.64 Mil/uL (ref 3.87–5.11)
RDW: 13.2 % (ref 11.5–15.5)
WBC: 6.6 10*3/uL (ref 4.0–10.5)

## 2014-03-10 LAB — LIPID PANEL
Cholesterol: 153 mg/dL (ref 0–200)
HDL: 36 mg/dL — ABNORMAL LOW (ref >39)
LDL CALC: 98 mg/dL (ref 0–99)
TRIGLYCERIDES: 94 mg/dL (ref <150)
Total CHOL/HDL Ratio: 4.3 RATIO
VLDL: 19 mg/dL (ref 0–40)

## 2014-03-10 LAB — PROTIME-INR
INR: 1.1 ratio — AB (ref 0.8–1.0)
Prothrombin Time: 11.9 s (ref 9.6–13.1)

## 2014-03-10 LAB — COMPREHENSIVE METABOLIC PANEL
ALT: 38 U/L — ABNORMAL HIGH (ref 0–35)
AST: 25 U/L (ref 0–37)
Albumin: 3.9 g/dL (ref 3.5–5.2)
Alkaline Phosphatase: 60 U/L (ref 39–117)
BILIRUBIN TOTAL: 0.5 mg/dL (ref 0.2–1.2)
BUN: 13 mg/dL (ref 6–23)
CO2: 30 meq/L (ref 19–32)
CREATININE: 0.9 mg/dL (ref 0.4–1.2)
Calcium: 9.3 mg/dL (ref 8.4–10.5)
Chloride: 106 mEq/L (ref 96–112)
GFR: 71.48 mL/min (ref >60.00)
GLUCOSE: 90 mg/dL (ref 70–99)
Potassium: 4.1 mEq/L (ref 3.5–5.1)
Sodium: 140 mEq/L (ref 135–145)
Total Protein: 6.8 g/dL (ref 6.0–8.3)

## 2014-03-10 LAB — CK TOTAL AND CKMB (NOT AT ARMC)
CK, MB: 2.8 ng/mL (ref 0.3–4.0)
Relative Index: INVALID (ref 0.0–2.5)
Total CK: 72 U/L (ref 7–177)

## 2014-03-10 SURGERY — LEFT HEART CATHETERIZATION WITH CORONARY ANGIOGRAM
Anesthesia: LOCAL

## 2014-03-10 MED ORDER — VERAPAMIL HCL 2.5 MG/ML IV SOLN
INTRAVENOUS | Status: AC
  Filled 2014-03-10: qty 2

## 2014-03-10 MED ORDER — ONDANSETRON HCL 4 MG/2ML IJ SOLN: 4.0000 mg | Freq: Four times a day (QID) | INTRAMUSCULAR | Status: DC | PRN

## 2014-03-10 MED ORDER — LIDOCAINE HCL (PF) 1 % IJ SOLN
INTRAMUSCULAR | Status: AC
  Filled 2014-03-10: qty 30

## 2014-03-10 MED ORDER — SODIUM CHLORIDE 0.9 % IV SOLN
INTRAVENOUS | Status: DC
  Administered 2014-03-10: 11:00:00 via INTRAVENOUS

## 2014-03-10 MED ORDER — FENTANYL CITRATE 0.05 MG/ML IJ SOLN
INTRAMUSCULAR | Status: AC
  Filled 2014-03-10: qty 2

## 2014-03-10 MED ORDER — NITROGLYCERIN 1 MG/10 ML FOR IR/CATH LAB
INTRA_ARTERIAL | Status: AC
  Filled 2014-03-10: qty 10

## 2014-03-10 MED ORDER — ACETAMINOPHEN 325 MG PO TABS: 650.0000 mg | ORAL_TABLET | ORAL | Status: DC | PRN

## 2014-03-10 MED ORDER — ASPIRIN 81 MG PO CHEW: 81.0000 mg | CHEWABLE_TABLET | ORAL | Status: DC

## 2014-03-10 MED ORDER — OXYCODONE-ACETAMINOPHEN 5-325 MG PO TABS: 1.0000 | ORAL_TABLET | ORAL | Status: DC | PRN

## 2014-03-10 MED ORDER — MIDAZOLAM HCL 2 MG/2ML IJ SOLN
INTRAMUSCULAR | Status: AC
  Filled 2014-03-10: qty 2

## 2014-03-10 MED ORDER — SODIUM CHLORIDE 0.9 % IV SOLN: INTRAVENOUS | Status: AC

## 2014-03-10 MED ORDER — HEPARIN SODIUM (PORCINE) 1000 UNIT/ML IJ SOLN
INTRAMUSCULAR | Status: AC
  Filled 2014-03-10: qty 1

## 2014-03-10 MED ORDER — HEPARIN (PORCINE) IN NACL 2-0.9 UNIT/ML-% IJ SOLN
INTRAMUSCULAR | Status: AC
  Filled 2014-03-10: qty 1500

## 2014-03-10 NOTE — Telephone Encounter (Signed)
Pt notified about STAT Troponin per Melina Copa, PA  is negative.

## 2014-03-10 NOTE — Discharge Instructions (Signed)
Radial Site Care °Refer to this sheet in the next few weeks. These instructions provide you with information on caring for yourself after your procedure. Your caregiver may also give you more specific instructions. Your treatment has been planned according to current medical practices, but problems sometimes occur. Call your caregiver if you have any problems or questions after your procedure. °HOME CARE INSTRUCTIONS °· You may shower the day after the procedure. Remove the bandage (dressing) and gently wash the site with plain soap and water. Gently pat the site dry. °· Do not apply powder or lotion to the site. °· Do not submerge the affected site in water for 3 to 5 days. °· Inspect the site at least twice daily. °· Do not flex or bend the affected arm for 24 hours. °· No lifting over 5 pounds (2.3 kg) for 5 days after your procedure. °· Do not drive home if you are discharged the same day of the procedure. Have someone else drive you. °· You may drive 24 hours after the procedure unless otherwise instructed by your caregiver. °· Do not operate machinery or power tools for 24 hours. °· A responsible adult should be with you for the first 24 hours after you arrive home. °What to expect: °· Any bruising will usually fade within 1 to 2 weeks. °· Blood that collects in the tissue (hematoma) may be painful to the touch. It should usually decrease in size and tenderness within 1 to 2 weeks. °SEEK IMMEDIATE MEDICAL CARE IF: °· You have unusual pain at the radial site. °· You have redness, warmth, swelling, or pain at the radial site. °· You have drainage (other than a small amount of blood on the dressing). °· You have chills. °· You have a fever or persistent symptoms for more than 72 hours. °· You have a fever and your symptoms suddenly get worse. °· Your arm becomes pale, cool, tingly, or numb. °· You have heavy bleeding from the site. Hold pressure on the site and call 911. °Document Released: 04/15/2010 Document  Revised: 06/05/2011 Document Reviewed: 04/15/2010 °ExitCare® Patient Information ©2015 [employer]. This information is not intended to replace advice given to you by your health care provider. Make sure you discuss any questions you have with your health care provider. ° °

## 2014-03-10 NOTE — Interval H&P Note (Signed)
History and Physical Interval Note:  03/10/2014 1:47 PM Cath Lab Visit (complete for each Cath Lab visit)  Clinical Evaluation Leading to the Procedure:   ACS: No.  Non-ACS:    Anginal Classification: CCS III  Anti-ischemic medical therapy: Minimal Therapy (1 class of medications)  Non-Invasive Test Results: Low-risk stress test findings: cardiac mortality <1%/year  Prior CABG: No previous CABG       Heather Frederick  has presented today for surgery, with the diagnosis of cp  The various methods of treatment have been discussed with the patient and family. After consideration of risks, benefits and other options for treatment, the patient has consented to  Procedure(s): LEFT HEART CATHETERIZATION WITH CORONARY ANGIOGRAM (N/A) as a surgical intervention .  The patient's history has been reviewed, patient examined, no change in status, stable for surgery.  I have reviewed the patient's chart and labs.  Questions were answered to the patient's satisfaction.     Sinclair Grooms

## 2014-03-10 NOTE — Research (Signed)
Bioflow Study Informed Consent   Subject Name: Heather Frederick  Subject met inclusion and exclusion criteria.  The informed consent form, study requirements and expectations were reviewed with the subject and questions and concerns were addressed prior to the signing of the consent form.  The subject verbalized understanding of the trail requirements.  The subject agreed to participate in the Bioflow trial and signed the informed consent.  The informed consent was obtained prior to performance of any protocol-specific procedures for the subject.  A copy of the signed informed consent was given to the subject and a copy was placed in the subject's medical record.  [name] 03/10/2014, 10:15

## 2014-03-10 NOTE — H&P (View-Only) (Signed)
[place], [address], [place]  [address] Phone: [phone] Fax:  [phone]  Date:  03/09/2014   Patient ID:  Heather Frederick, Heather Frederick Jul 25, 1955, MRN [id number]   PCP:  Olga Millers, MD  Cardiologist: Linard Millers   History of Present Illness: MYRIKAL MESSMER is a 58 y.o. female with history of ?dyslipidemia, morbid obesity, migraine headaches, and family history of CAD who presents for follow-up evaluation of chest pain. She was recently evaluated by Dr. Tamala Julian for 6 months of progressive dyspnea and fleeting chest pain. She has h/o remote cath 2003 that was unremarkable. Pulmonary function testing done by her primary physician demonstrated a diffusion abnormality suggestive of CHF. However, 2D echo 10/2013: mild LVH, EF 55-60%, mildly dilated LA and pBNP totally normal 01/13/14. She underwent nuc 01/23/14: "Low risk stress nuclear study with anterior breast attenuation artifact and possible small area of inferoapical ischemia. Technically limited study.Study specificity is limited by significant interference from infradiaphragmatic visceral uptake, especially on rest images. LV Ejection Fraction: 61%." D-dimer was mildly elevated at 0.69 and f/u CTA 01/2014 showed: "no evidence of PE, mosaic profusion of the lungs may indicate small airways disease or perfusion abnormalities, question small gallstones within the gallbladder." Dr. Tamala Julian recommended to consider coronary angiography if she has recurrent episodes of chest pain. She is pending outpatient sleep study. One brother was 69 when he had CABG; father had CABG in his 46s.   She comes in today for continued intermittent symptoms of chest pain. She is now having chest pain/pressure on a daily basis. It has a tendency to move to the left side of her chest. She also feels like it moves up to her throat like someone is choking her. It lasts minutes mostly but has lasted for hours before. She denies associated nausea, vomiting,  diaphoresis, LEE, syncope. She denies palpitations but reports feeling jittery with this, like something isn't right. The pain is not worse after eating. She thinks there might be a slight increase in discomfort when walking around but is not totally sure since activity is limited by OA of the knees. In general the only thing that makes the pain worse is thinking about it. She had an increased episode earlier today for about 1.5 hours. She currently rates the discomfort at 1/10 - a general awareness of it. EKG is nonacute.  Nuclear Stress Test 01/23/14: Overall Impression: Low risk stress nuclear study with anterior breast attenuation artifact and possible small area of inferoapical ischemia. Technically limited study.Study specificity is limited by significant interference from infradiaphragmatic visceral uptake, especially on rest images. LV Ejection Fraction: 61%. LV Wall Motion: NL LV Function; NL Wall Motion    Recent Labs: 01/13/2014: Pro B Natriuretic peptide (BNP) 10.0 02/16/2014: Creatinine 0.9; Potassium 4.2  Wt Readings from Last 3 Encounters:  03/09/14 299 lb 3.2 oz (135.716 kg)  02/16/14 296 lb 6.4 oz (134.446 kg)  02/16/14 289 lb 6.4 oz (131.271 kg)     Past Medical History  Diagnosis Date  . Family history of anesthesia complication     father has a severe hard time waking up  . Anxiety   . Mental disorder   . GERD (gastroesophageal reflux disease)   . Arthritis   . Hard of hearing   . Migraine headache   . Obesity   . Depression   . Lateral epicondylitis of right elbow   . Dyslipidemia   . History of frequent urinary tract infections   .  IBS (irritable bowel syndrome)   . Meralgia paresthetica of right side 10/02/2012  . Osteoarthritis   . Gallstones     a. Seen on CT 01/2014.  Marland Kitchen Dyspnea     a. Echo 10/2013: mild LVH, EF 55-60%, mildly dilated LA. b. pBNP normal 12/2013. c. Nuc 12/2013: low risk with anterior breast attenuation and possible small area of  inferoapical ischemia. Technically limited. EF 61%. d. CTA 01/2014: no PE. Mosiac perfusion of lungs may indiate small airways disease or perfusion abnormalities. Question small gallstones within gb."    Current Outpatient Prescriptions  Medication Sig Dispense Refill  . acetaminophen (TYLENOL) 500 MG tablet Take 1,000 mg by mouth 2 (two) times daily as needed for pain.    . Cholecalciferol (VITAMIN D-3) 1000 UNITS CAPS Take by mouth.    . citalopram (CELEXA) 20 MG tablet Take 20 mg by mouth every evening.      . Cranberry (HM CRANBERRY SUPER STRENGTH) 300 MG tablet Take 300 mg by mouth 2 (two) times daily.    . Cyanocobalamin (VITAMIN B-12) 5000 MCG SUBL Place 2,500 mcg under the tongue daily.     Marland Kitchen eletriptan (RELPAX) 40 MG tablet Take 1 tablet (40 mg total) by mouth every 2 (two) hours as needed for migraine. 8 tablet 5  . loratadine (CLARITIN) 10 MG tablet Take 20 mg by mouth 2 (two) times daily as needed for allergies.     . Lutein-Zeaxanthin 25-5 MG CAPS Take 1 capsule by mouth daily.    . Multiple Vitamin (MULTIVITAMIN WITH MINERALS) TABS Take 1 tablet by mouth daily.    Marland Kitchen nystatin cream (MYCOSTATIN) Apply 1 application topically daily as needed (for itching).    . Omega-3 Fatty Acids (FISH OIL TRIPLE STRENGTH) 1400 MG CAPS Take 1,200 mg by mouth daily.     Marland Kitchen oxybutynin (DITROPAN-XL) 10 MG 24 hr tablet Take 10 mg by mouth daily.    . pantoprazole (PROTONIX) 40 MG tablet Take 40 mg by mouth every morning.     . pregabalin (LYRICA) 200 MG capsule Take 1 capsule (200 mg total) by mouth every evening. 30 capsule 3  . propranolol (INDERAL) 40 MG tablet Take 1 tablet (40 mg total) by mouth 2 (two) times daily. (Patient taking differently: Take 40 mg by mouth daily. ) 60 tablet 5  . simvastatin (ZOCOR) 20 MG tablet Take 20 mg by mouth every evening.      No current facility-administered medications for this visit.    Allergies:   Topamax; Advil; Aleve; Bee venom; Echinacea; Other; and Sulfa  antibiotics   Social History:  The patient  reports that she has been passively smoking.  She has never used smokeless tobacco. She reports that she drinks alcohol. She reports that she does not use illicit drugs.   Family History:  The patient's family history includes Breast cancer in her other; Coronary artery disease in her father; Hypertension in her brother, father, and mother; Kidney failure in her brother; Migraines in her brother, daughter, and father; Stroke in her mother.   ROS:  Please see the history of present illness.    All other systems reviewed and negative.   PHYSICAL EXAM:  VS:  BP 128/84 mmHg  Pulse 71  Ht 5\' 5"  (1.651 m)  Wt 299 lb 3.2 oz (135.716 kg)  BMI 49.79 kg/m2 Well nourished, well developed obese WF, in no acute distress HEENT: normal Neck: no JVD, no carotid bruits Cardiac:  normal S1, S2; RRR; no murmur Lungs:  clear to auscultation bilaterally, no wheezing, rhonchi or rales Abd: soft, nontender, no hepatomegaly Ext: no edema Skin: warm and dry Neuro:  moves all extremities spontaneously, no focal abnormalities noted  EKG:  NSR 71bpm, no acute ST-T changes   ASSESSMENT AND PLAN:  1. Precordial chest pain/dyspnea 2. ? Possible prior history of dyslipidemia, on Zocor 3. Morbid obesity BMI Body mass index is 49.79 kg/(m^2). 4. Gallstones seen on CT 01/2014 5. Secondhand smoke exposure in early lifetime  Per review of chart, Dr. Tamala Julian felt that if chest pain were to continue that she would need coronary angiography despite a low risk myocardial perfusion study. I think this is appropriate and the patient agrees. Cardiac risk factors include obesity, possible dyslipidemia (on Zocor - lipid status unclear), family history of CAD, and remote passive tobacco exposure. I discussed the possibility of inpatient vs. outpatient evaluation with Dr. Mare Ferrari. Given low-grade symptoms, low risk nuclear stress test, and normal EKG he feels that having her come back  in for outpatient cath in the AM is acceptable. Given her symptoms of 1.5 hours today, will check a stat troponin. If abnormal, she will need to go to the ER and she is aware of this. If negative, will plan on outpatient cardiac cath tomorrow with Dr. Tamala Julian. I will sign this out to our on-call PA to be on the look-out. Risks and benefits of cardiac catheterization have been discussed with the patient. These include bleeding, infection, kidney damage, stroke, heart attack, death. The patient understands these risks and is willing to proceed. Will start ASA 81mg  and rx NTG prn. Will also check pre-cath labs of CMET, CBC, PT/INR. Will have her fasting lipids checked in the AM. If cardiac cath is negative, she should f/u with her PCP to further evaluate for GI etiology including small cholelithasis seen on recent CT. ER precautions reviewed.  Note: she has h/o allergy to ibuprofen and naproxen in her chart with hives, but states this was around a time when everything seemed to give her hives so she is not sure if it was a true allergy. She has been able to use voltaren gel without any allergic reaction and has also been able to take aspirin in the past without any complications or side effects.  Dispo: F/u 2 weeks after catheterization with Dr. Tamala Julian or me/APP.  Signed, Melina Copa, PA-C  03/09/2014 3:48 PM

## 2014-03-10 NOTE — Progress Notes (Signed)
@  1545 last 3 cc removed and pt bled, reinserted with 3cc and it continued to bleed, 3 cc added. 6 cc total 2+ radial pulse. I will continue to monitor and restart to deflate in 30 minutes.

## 2014-03-10 NOTE — CV Procedure (Signed)
     Left Heart Catheterization with Coronary Angiography  Report  LOUNETTE SLOAN  58 y.o.  female Jan 31, 1956  Procedure Date: 03/10/2014 Referring Physician: Olga Millers, M.D. Primary Cardiologist:: H WB Blenda Bridegroom, M.D.  INDICATIONS: Chest pain and dyspnea despite low risk myocardial perfusion study in a markedly obese female with risk factors for CAD. Studies being done to define anatomy and exclude the possibility of coronary disease contributing to complaints.  PROCEDURE: 1. Left heart catheterization; 2. Left ventriculography; 3. Coronary angiography  CONSENT:  The risks, benefits, and details of the procedure were explained in detail to the patient. Risks including death, stroke, heart attack, kidney injury, allergy, limb ischemia, bleeding and radiation injury were discussed.  The patient verbalized understanding and wanted to proceed.  Informed written consent was obtained.  PROCEDURE TECHNIQUE:  After Xylocaine anesthesia a 5 French Slender sheath was placed in the right radial artery with an angiocath and the modified Seldinger technique.  Coronary angiography was done using a 5 F JR 4 and JL 3.5 cm diagnostic catheters.  Left ventriculography was done using the JR 4 catheter and hand injection.   Digital images were reviewed and the case was terminated. Hemostasis was achieved with a wrist band.   CONTRAST:  Total of 80 cc.  COMPLICATIONS:  None   HEMODYNAMICS:  Aortic pressure 146/89 mmHg; LV pressure 153 over 19 mmHg; LVEDP 33 mmHg  ANGIOGRAPHIC DATA:   The left main coronary artery is normal.  The left anterior descending artery is normal. It wraps around the left ventricular apex. It gives origin to a large first diagonal which is also normal..  The left circumflex artery is codominant. Gives origin to a bifurcating second obtuse marginal and the PDA. The vessel is normal..  The right coronary artery is codominant. The vessel is normal..   LEFT  VENTRICULOGRAM:  Left ventricular angiogram was done in the 30 RAO projection and revealed normal cavity size with normal contractility. EF 60%.    IMPRESSIONS:  1. Normal coronary arteries 2. Normal systolic function with EF 60%. Elevated end-diastolic pressures compatible with diastolic heart failure.   RECOMMENDATION:  Low-dose diuretic therapy. Titrate beta blocker therapy as tolerated. Chest pain is felt to be noncardiac. No further cardiac evaluation is indicated.Marland Kitchen

## 2014-03-11 ENCOUNTER — Telehealth: Payer: Self-pay | Admitting: Interventional Cardiology

## 2014-03-11 NOTE — Telephone Encounter (Signed)
New Message   Pt has ov with Melina Copa 12/29; ov with Dr. Tamala Julian 2/3 (his earliest). Pt is wondering if her sleep study on 2/2 will interfere and id asking if that Dr. Tamala Julian appt should be pushed later in to Feb. Please call back and discuss.

## 2014-03-11 NOTE — Telephone Encounter (Signed)
Pt aware that appt with Melina Copa, PA will be cancelled. And appt with Dr.Smith moved out to 05/07/14.ok per Dr.Smith. Adv her to call if her cath site is not healing, or any questions or concerns.pt agreeable and verbalized understanding.

## 2014-03-16 ENCOUNTER — Telehealth: Payer: Self-pay | Admitting: Interventional Cardiology

## 2014-03-16 NOTE — Telephone Encounter (Signed)
Reviewed with Terri Skains recommended pt follow up with Dr Doug Sou. Pt advised, verbalized understanding.

## 2014-03-16 NOTE — Telephone Encounter (Signed)
Pt states she continues to have choking low in her throat, symptoms same as prior to cardiac cath.

## 2014-03-16 NOTE — Telephone Encounter (Signed)
New Message  Pt called states that she is still feeling a "Choking" in her throat. Pt is not sure that it is a cardiac issue. Please call back to discuss

## 2014-03-18 ENCOUNTER — Telehealth: Payer: Self-pay | Admitting: *Deleted

## 2014-03-18 ENCOUNTER — Encounter (HOSPITAL_BASED_OUTPATIENT_CLINIC_OR_DEPARTMENT_OTHER): Payer: 59

## 2014-03-18 NOTE — Telephone Encounter (Signed)
[facility] - Client [facility] Patient Name: Heather Frederick Gender: Female DOB: 01/14/1959 Age: 58 Y 2 M 2 D Return Phone Number: [phone] (Primary) Address: [address] City/State/Zip: [place] [address] Client [facility] - Client Client Site [place] - Day Physician Unice Cobble Contact Type Call Call Type Triage / Clinical Relationship To Patient Self Return Phone Number [phone] (Primary) Chief Complaint Eye Pain Initial Comment Caller states that she woke up with a swollen eye lid and wants to see if the doctor can call her in something. PreDisposition Home Care Nurse Assessment Nurse: Dimas Chyle, RN, Levada Dy Date/Time Eilene Ghazi Time): 03/18/2014 2:20:57 PM Confirm and document reason for call. If symptomatic, describe symptoms. ---Caller states that she woke up with a swollen eye lid and wants to see if the doctor can call her in something. Started yesterday and some pain around the tear duct. This morning more swelling and feels scratchy. No fever. No cough, no runny nose. Has the patient traveled out of the country within the last 30 days? ---Not Applicable Does the patient require triage? ---Yes Related visit to physician within the last 2 weeks? ---No Does the PT have any chronic conditions? (i.e. diabetes, asthma, etc.) ---Yes List chronic conditions. ---HTN Did the patient indicate they were pregnant? ---No Guidelines Guideline Title Affirmed Question Affirmed Notes Nurse Date/Time Eilene Ghazi Time) Eye Pain [1] Eye pain/discomfort AND [2] more than mild Gaylyn Cheers 03/18/2014 2:22:58 PM Disp. Time Eilene Ghazi Time) Disposition Final User 03/18/2014 2:15:49 PM Send To Clinical Follow Up Russ Halo 03/18/2014 2:25:08 PM See Physician within 4 Hours (or PCP triage) Yes Dimas Chyle, RN, Marin Shutter Understands: Yes PLEASE NOTE: All  timestamps contained within this report are represented as [place] Standard Time. CONFIDENTIALTY NOTICE: This fax transmission is intended only for the addressee. It contains information that is legally privileged, confidential or otherwise protected from use or disclosure. If you are not the intended recipient, you are strictly prohibited from reviewing, disclosing, copying using or disseminating any of this information or taking any action in reliance on or regarding this information. If you have received this fax in error, please notify us immediately by telephone so that we can arrange for its return to [place]. Phone: [phone], Toll-Free: [phone], Fax: [phone] Page: 2 of 2 Call Id: [id number] Disagree/Comply: Comply Care Advice Given Per Guideline SEE PHYSICIAN WITHIN 4 HOURS (or PCP triage): * IF NO PCP TRIAGE: You need to be seen. Go to _______________ (ED/ [facility] or office if it will be open) within the next 3 or 4 hours. Go sooner if you become worse. AVOID RUBBING: * Do not rub your eyes. PAIN MEDICINES: ACETAMINOPHEN (E.G., TYLENOL): IBUPROFEN (E.G., MOTRIN, ADVIL): CALL BACK IF: * You become worse. CARE ADVICE given per Eye Pain (Adult) guideline. After Care Instructions Given Call Event Type User Date / Time Description Referrals REFERRED TO PCP OFFICE [facility]

## 2014-03-24 ENCOUNTER — Ambulatory Visit: Payer: 59 | Admitting: Physician Assistant

## 2014-03-25 ENCOUNTER — Encounter: Payer: Self-pay | Admitting: Family

## 2014-03-25 ENCOUNTER — Ambulatory Visit (INDEPENDENT_AMBULATORY_CARE_PROVIDER_SITE_OTHER): Payer: 59 | Admitting: Family

## 2014-03-25 VITALS — BP 132/78 | HR 59 | Temp 97.6°F | Resp 16 | Ht 65.5 in | Wt 297.8 lb

## 2014-03-25 DIAGNOSIS — R221 Localized swelling, mass and lump, neck: Secondary | ICD-10-CM

## 2014-03-25 NOTE — Progress Notes (Signed)
Subjective:    Patient ID: Heather Frederick, female    DOB: 12/30/1955, 58 y.o.   MRN: [id number]  Chief Complaint  Patient presents with  . Chest discomfort    Had a heart cath the week before last, still having throat and chest tightness, feels like someone is choking her and moves down to chest, x1 month    HPI:  Heather Frederick is a 58 y.o. female who presents today for an acute visit.   Acute symptoms of neck fullness and feeling as though "she is wearing a turtleneck that is too tight and it sometimes feel like she is being choked" has been going on for approximately one month and appears to be slightly worsening. Severity is not enough to effect breathing or swallowing. States she also has tightness in her chest and feels like someone is squeezing it on occasion.  She was recently seen for a cardiac catheterization which revealed normal coronary arteries and an ejection fraction of 60%. It was believed that this is not cardiac related.  Allergies  Allergen Reactions  . Topamax [Topiramate] Other (See Comments)    :Stroke like symptoms  . Advil [Ibuprofen] Hives    Has tolerated Voltaren topical as well as aspirin.  Tori Milks [Naproxen Sodium] Hives    Has tolerated Voltaren topical as well as aspirin.  . Bee Venom Swelling  . Echinacea Hives  . Other Other (See Comments)    Feathers cause sinus congestion  . Sulfa Antibiotics Hives    Current Outpatient Prescriptions on File Prior to Visit  Medication Sig Dispense Refill  . acetaminophen (TYLENOL) 500 MG tablet Take 1,000 mg by mouth 2 (two) times daily as needed for pain.    Marland Kitchen aspirin EC 81 MG tablet Take 1 tablet (81 mg total) by mouth daily.    . Cholecalciferol (VITAMIN D-3) 1000 UNITS CAPS Take by mouth.    . citalopram (CELEXA) 20 MG tablet Take 20 mg by mouth every evening.      . Cranberry (HM CRANBERRY SUPER STRENGTH) 300 MG tablet Take 300 mg by mouth 2 (two) times daily.    . Cranberry-Cholecalciferol (SUPER  CRANBERRY/VITAMIN D3) 4200-500 MG-UNIT CAPS Take 1 capsule by mouth daily.    . Cyanocobalamin (VITAMIN B-12) 2500 MCG SUBL Place 2,500 mcg under the tongue daily.    Marland Kitchen eletriptan (RELPAX) 40 MG tablet Take 1 tablet (40 mg total) by mouth every 2 (two) hours as needed for migraine. 8 tablet 5  . loratadine (CLARITIN) 10 MG tablet Take 20 mg by mouth 2 (two) times daily as needed for allergies.     . Lutein-Zeaxanthin 25-5 MG CAPS Take 1 capsule by mouth daily.    . Multiple Vitamin (MULTIVITAMIN WITH MINERALS) TABS Take 1 tablet by mouth daily.    . nitroGLYCERIN (NITROSTAT) 0.4 MG SL tablet Place 1 tablet (0.4 mg total) under the tongue every 5 (five) minutes as needed. (Patient taking differently: Place 0.4 mg under the tongue every 5 (five) minutes as needed for chest pain. ) 25 tablet 3  . nystatin cream (MYCOSTATIN) Apply 1 application topically daily as needed (for itching).    . Omega-3 Fatty Acids (FISH OIL) 1200 MG CAPS Take 1,200 mg by mouth daily.    Marland Kitchen oxybutynin (DITROPAN-XL) 10 MG 24 hr tablet Take 10 mg by mouth daily.    . pantoprazole (PROTONIX) 40 MG tablet Take 40 mg by mouth every morning.     . pregabalin (LYRICA) 200 MG  capsule Take 1 capsule (200 mg total) by mouth every evening. (Patient taking differently: Take 200 mg by mouth daily. ) 30 capsule 3  . propranolol (INDERAL) 40 MG tablet Take 1 tablet (40 mg total) by mouth 2 (two) times daily. (Patient taking differently: Take 40 mg by mouth daily. ) 60 tablet 5  . simvastatin (ZOCOR) 20 MG tablet Take 20 mg by mouth daily.      No current facility-administered medications on file prior to visit.   Past Medical History  Diagnosis Date  . Family history of anesthesia complication     father has a severe hard time waking up  . Anxiety   . Mental disorder   . GERD (gastroesophageal reflux disease)   . Arthritis   . Hard of hearing   . Migraine headache   . Obesity   . Depression   . Lateral epicondylitis of right  elbow   . Dyslipidemia   . History of frequent urinary tract infections   . IBS (irritable bowel syndrome)   . Meralgia paresthetica of right side 10/02/2012  . Osteoarthritis   . Gallstones     a. Seen on CT 01/2014.  Marland Kitchen Dyspnea     a. Echo 10/2013: mild LVH, EF 55-60%, mildly dilated LA. b. pBNP normal 12/2013. c. Nuc 12/2013: low risk with anterior breast attenuation and possible small area of inferoapical ischemia. Technically limited. EF 61%. d. CTA 01/2014: no PE. Mosiac perfusion of lungs may indiate small airways disease or perfusion abnormalities. Question small gallstones within gb."    Review of Systems  Constitutional: Negative for fever, chills and appetite change.  HENT: Negative for sore throat and trouble swallowing.   Respiratory: Negative for chest tightness and shortness of breath.   Cardiovascular: Negative for chest pain, palpitations and leg swelling.       Objective:    BP 132/78 mmHg  Pulse 59  Temp(Src) 97.6 F (36.4 C) (Oral)  Resp 16  Ht 5' 5.5" (1.664 m)  Wt 297 lb 12.8 oz (135.081 kg)  BMI 48.79 kg/m2  SpO2 95% Nursing note and vital signs reviewed.  Physical Exam  Constitutional: She is oriented to person, place, and time. She appears well-developed and well-nourished. No distress.  Morbidly obese female seated in the chair, appears her stated age, is dressed appropriately for the situation and is in no apparent distress.  HENT:  Mouth/Throat: Uvula is midline, oropharynx is clear and moist and mucous membranes are normal.  Neck: Normal range of motion. Neck supple. No thyromegaly present.  No palpable masses noted  Cardiovascular: Normal rate, regular rhythm, normal heart sounds and intact distal pulses.   Pulmonary/Chest: Effort normal and breath sounds normal.  Neurological: She is alert and oriented to person, place, and time.  Skin: Skin is warm and dry.  Psychiatric: She has a normal mood and affect. Her behavior is normal. Judgment and  thought content normal.       Assessment & Plan:

## 2014-03-25 NOTE — Progress Notes (Signed)
Pre visit review using our clinic review tool, if applicable. No additional management support is needed unless otherwise documented below in the visit note. 

## 2014-03-25 NOTE — Patient Instructions (Signed)
Thank you for choosing [facility].  Summary/Instructions:  If your symptoms worsen or fail to improve, please contact our office for further instruction, or in case of emergency go directly to the emergency room at the closest medical facility.   Referrrals have been made to Gastroenterology and to get an ultrasound of your neck.

## 2014-03-25 NOTE — Assessment & Plan Note (Signed)
Idiopathic fullness of neck. Given normal cardiac cath low suspicion of cardiac related origin. No palpable masses upon evaluation. Obtain ultrasound of the neck. Refer to GI for further assistance and possible endoscopy. Instructed to seek emergency care if symptoms worsen or difficulty breathing develops. Follow up if symptoms worsen or fail to improve.

## 2014-03-26 ENCOUNTER — Encounter: Payer: Self-pay | Admitting: Gastroenterology

## 2014-03-26 ENCOUNTER — Other Ambulatory Visit: Payer: Self-pay | Admitting: Neurology

## 2014-03-26 NOTE — Telephone Encounter (Signed)
Rx signed and faxed.

## 2014-04-01 ENCOUNTER — Telehealth: Payer: Self-pay | Admitting: Internal Medicine

## 2014-04-01 NOTE — Telephone Encounter (Signed)
Rec'd from [place] @ [facility] forward 155 pages to Dr. Doug Sou

## 2014-04-02 ENCOUNTER — Ambulatory Visit
Admission: RE | Admit: 2014-04-02 | Discharge: 2014-04-02 | Disposition: A | Discharge status: Home / Self Care | Payer: 59 | Source: Ambulatory Visit | Attending: Family | Admitting: Family

## 2014-04-02 ENCOUNTER — Encounter: Payer: Self-pay | Admitting: Family

## 2014-04-02 DIAGNOSIS — R221 Localized swelling, mass and lump, neck: Secondary | ICD-10-CM

## 2014-04-22 ENCOUNTER — Encounter: Payer: Self-pay | Admitting: Internal Medicine

## 2014-04-28 ENCOUNTER — Encounter (HOSPITAL_BASED_OUTPATIENT_CLINIC_OR_DEPARTMENT_OTHER): Payer: 59

## 2014-04-29 ENCOUNTER — Ambulatory Visit: Payer: 59 | Admitting: Interventional Cardiology

## 2014-05-07 ENCOUNTER — Encounter: Payer: Self-pay | Admitting: Interventional Cardiology

## 2014-05-07 ENCOUNTER — Ambulatory Visit (INDEPENDENT_AMBULATORY_CARE_PROVIDER_SITE_OTHER): Payer: 59 | Admitting: Interventional Cardiology

## 2014-05-07 VITALS — BP 118/76 | HR 102 | Ht 65.0 in | Wt 298.1 lb

## 2014-05-07 DIAGNOSIS — E785 Hyperlipidemia, unspecified: Secondary | ICD-10-CM

## 2014-05-07 DIAGNOSIS — I5030 Unspecified diastolic (congestive) heart failure: Secondary | ICD-10-CM

## 2014-05-07 DIAGNOSIS — I519 Heart disease, unspecified: Secondary | ICD-10-CM

## 2014-05-07 MED ORDER — METOPROLOL SUCCINATE ER 25 MG PO TB24: ORAL_TABLET | ORAL | Status: DC

## 2014-05-07 NOTE — Progress Notes (Signed)
Patient ID: Heather Frederick, female   DOB: [birth year], 59 y.o.   MRN: [id number]    Cardiology Office Note   Date:  05/07/2014   ID:  Heather Frederick, DOB Nov 14, 1955, MRN [id number]  PCP:  Olga Millers, MD  Cardiologist:   Sinclair Grooms, MD   Chief Complaint  Patient presents with  . Appointment    has had some chest discomfort since Cath      History of Present Illness: Heather Frederick is a 59 y.o. female who presents for follow-up of diastolic dysfunction. Recurring chest pain has occurred but coronary angiography demonstrated no evidence of obstruction. Her weight continues to increase. Orthopnea is slightly improved after addition of low-dose diuretic    Past Medical History  Diagnosis Date  . Family history of anesthesia complication     father has a severe hard time waking up  . Anxiety   . Mental disorder   . GERD (gastroesophageal reflux disease)   . Arthritis   . Hard of hearing   . Migraine headache   . Obesity   . Depression   . Lateral epicondylitis of right elbow   . Dyslipidemia   . History of frequent urinary tract infections   . IBS (irritable bowel syndrome)   . Meralgia paresthetica of right side 10/02/2012  . Osteoarthritis   . Gallstones     a. Seen on CT 01/2014.  Marland Kitchen Dyspnea     a. Echo 10/2013: mild LVH, EF 55-60%, mildly dilated LA. b. pBNP normal 12/2013. c. Nuc 12/2013: low risk with anterior breast attenuation and possible small area of inferoapical ischemia. Technically limited. EF 61%. d. CTA 01/2014: no PE. Mosiac perfusion of lungs may indiate small airways disease or perfusion abnormalities. Question small gallstones within gb."    Past Surgical History  Procedure Laterality Date  . Joint replacement    . Knee arthroscopy Left 12/12  . Carpel tunnel rel Right 4/12  . Rotator cuff repair Left 6/11  . Plantar fascia release Right 12/10  . Bladder suspension  6/10  . Carpel tunnel left/right  7/08, 8/08 Bilateral 8/08 and 7/08  .  Knee arthroscopy Right 12/06  . Tennis elbow release Right 7/04  . Abdominal hysterectomy  1/04    partial  . Cardiac catheterization  9/03  . Total knee arthroplasty Left 09/10/2012    Procedure: TOTAL KNEE ARTHROPLASTY- left;  Surgeon: Garald Balding, MD;  Location: [facility];  Service: Orthopedics;  Laterality: Left;  Left total knee arthroplasty  . Left heart catheterization with coronary angiogram N/A 03/10/2014    Procedure: LEFT HEART CATHETERIZATION WITH CORONARY ANGIOGRAM;  Surgeon: Sinclair Grooms, MD;  Location: [facility] CATH LAB;  Service: Cardiovascular;  Laterality: N/A;     Current Outpatient Prescriptions  Medication Sig Dispense Refill  . acetaminophen (TYLENOL) 500 MG tablet Take 1,000 mg by mouth 2 (two) times daily as needed for pain.    Marland Kitchen aspirin EC 81 MG tablet Take 1 tablet (81 mg total) by mouth daily.    . Cholecalciferol (VITAMIN D-3) 1000 UNITS CAPS Take 1 capsule by mouth daily.     . citalopram (CELEXA) 20 MG tablet Take 20 mg by mouth every evening.      Marland Kitchen CRANBERRY-VITAMIN C PO Take 4,200 mg by mouth daily.    . Cyanocobalamin (VITAMIN B-12) 2500 MCG SUBL Place 2,500 mcg under the tongue daily.    Marland Kitchen eletriptan (RELPAX) 40 MG tablet Take 1 tablet (40  mg total) by mouth every 2 (two) hours as needed for migraine. 8 tablet 5  . loratadine (CLARITIN) 10 MG tablet Take 10-40 mg as needed    . Lutein-Zeaxanthin 25-5 MG CAPS Take 1 capsule by mouth daily.    Marland Kitchen LYRICA 200 MG capsule TAKE ONE (1) CAPSULE BY MOUTH EACH EVENING (Patient taking differently: TAKE ONE (1) CAPSULE BY MOUTH EACH MORNING) 30 capsule 3  . Multiple Vitamin (MULTIVITAMIN WITH MINERALS) TABS Take 1 tablet by mouth daily.    . nitroGLYCERIN (NITROSTAT) 0.4 MG SL tablet Place 1 tablet (0.4 mg total) under the tongue every 5 (five) minutes as needed. (Patient taking differently: Place 0.4 mg under the tongue every 5 (five) minutes as needed for chest pain. ) 25 tablet 3  . nystatin cream (MYCOSTATIN) Apply  1 application topically daily as needed (for itching).    . Omega-3 Fatty Acids (FISH OIL) 1200 MG CAPS Take 1,200 mg by mouth daily.    Marland Kitchen oxybutynin (DITROPAN-XL) 10 MG 24 hr tablet Take 10 mg by mouth daily.    . pantoprazole (PROTONIX) 40 MG tablet Take 40 mg by mouth every morning.     . simvastatin (ZOCOR) 20 MG tablet Take 20 mg by mouth daily.     Marland Kitchen spironolactone (ALDACTONE) 25 MG tablet Take 25 mg by mouth daily.     No current facility-administered medications for this visit.    Allergies:   Topamax; Advil; Aleve; Bee venom; Echinacea; Other; and Sulfa antibiotics    Social History:  The patient  reports that she has been passively smoking.  She has never used smokeless tobacco. She reports that she drinks alcohol. She reports that she does not use illicit drugs.   Family History:  The patient's family history includes Breast cancer in her other; Coronary artery disease in her father; Hypertension in her brother, father, and mother; Kidney failure in her brother; Migraines in her brother, daughter, and father; Stroke in her mother.    ROS:  Please see the history of present illness.   Otherwise, review of systems are positive for cough.   All other systems are reviewed and negative.    PHYSICAL EXAM: VS:  BP 118/76 mmHg  Pulse 102  Ht 5\' 5"  (1.651 m)  Wt 298 lb 2 oz (135.229 kg)  BMI 49.61 kg/m2 , BMI Body mass index is 49.61 kg/(m^2). GEN: Well nourished, well developed, in no acute distress HEENT: normal Neck: no JVD, carotid bruits, or masses Cardiac: RRR; no murmurs, rubs, or gallops,no edema  Respiratory:  clear to auscultation bilaterally, normal work of breathing GI: soft, nontender, nondistended, + BS MS: no deformity or atrophy Skin: warm and dry, no rash Neuro:  Strength and sensation are intact Psych: euthymic mood, full affect   EKG:  EKG is ordered today. The ekg ordered today demonstrates sinus tachycardia   Recent Labs: 01/13/2014: Pro B  Natriuretic peptide (BNP) 10.0 03/09/2014: ALT 38*; BUN 13; Creatinine 0.9; Hemoglobin 13.8; Platelets 181.0; Potassium 4.1; Sodium 140    Lipid Panel    Component Value Date/Time   CHOL 153 03/10/2014 1030   TRIG 94 03/10/2014 1030   HDL 36* 03/10/2014 1030   CHOLHDL 4.3 03/10/2014 1030   VLDL 19 03/10/2014 1030   LDLCALC 98 03/10/2014 1030      Wt Readings from Last 3 Encounters:  05/07/14 298 lb 2 oz (135.229 kg)  03/25/14 297 lb 12.8 oz (135.081 kg)  03/10/14 299 lb (135.626 kg)  Other studies Reviewed: Additional studies/ records that were reviewed today include: . Review of the above records demonstrates:    ASSESSMENT AND PLAN:  1.  Diastolic dysfunction with marked elevation and end-diastolic pressures at catheterization. Low-dose spiral lactone is been started. We will now start low-dose beta blocker to length and the diastolic filling time. Metoprolol succinate 12.5 mg daily and increase after one to 2 weeks to 25 mg daily. 2. Morbid obesity 3. Cough and orthopnea likely related to #1   Current medicines are reviewed at length with the patient today.  The patient does not have concerns regarding medicines.  The following changes have been made:  Continue spironoactone. Add metoprolol succinate pulp 0.5 mg per day and increase to 25 mg per day if no side effects.  Labs/ tests ordered today include: Basic metabolic panel in 3 months in follow-up  No orders of the defined types were placed in this encounter.     Disposition:   FU with Linard Millers in 3 months   Signed, Sinclair Grooms, MD  05/07/2014 8:22 AM    [facility] [place], [place], [place]  [address] Phone: [phone]; Fax: [phone]

## 2014-05-07 NOTE — Patient Instructions (Signed)
Your physician has recommended you make the following change in your medication:  1) Start metoprolol succinate ER 25 mg 1/2 tablet by mouth once daily x 1 week then increase to a whole tablet by mouth once daily if tolerated  Your physician recommends that you schedule a follow-up appointment in: 3 months with Dr. Tamala Julian  Your physician recommends that you return for lab work in: 3 months- BMP the day you see Dr. Tamala Julian.

## 2014-05-08 ENCOUNTER — Encounter: Payer: Self-pay | Admitting: Gastroenterology

## 2014-05-08 ENCOUNTER — Ambulatory Visit (INDEPENDENT_AMBULATORY_CARE_PROVIDER_SITE_OTHER): Payer: 59 | Admitting: Gastroenterology

## 2014-05-08 VITALS — BP 102/68 | HR 68 | Ht 63.25 in | Wt 299.0 lb

## 2014-05-08 DIAGNOSIS — R079 Chest pain, unspecified: Secondary | ICD-10-CM

## 2014-05-08 DIAGNOSIS — R935 Abnormal findings on diagnostic imaging of other abdominal regions, including retroperitoneum: Secondary | ICD-10-CM

## 2014-05-08 DIAGNOSIS — K219 Gastro-esophageal reflux disease without esophagitis: Secondary | ICD-10-CM

## 2014-05-08 DIAGNOSIS — M542 Cervicalgia: Secondary | ICD-10-CM

## 2014-05-08 MED ORDER — PANTOPRAZOLE SODIUM 40 MG PO TBEC: 40.0000 mg | DELAYED_RELEASE_TABLET | Freq: Two times a day (BID) | ORAL | Status: DC

## 2014-05-08 NOTE — Progress Notes (Signed)
History of Present Illness: This is a 59 year old female referred by Mauricio Po, FNP for neck pain, chest pain and SOB for the past 4 months. She relates intermittent episodes that last anywhere from a few minutes to an hour. These episodes are not necessarily associated with exertion or movement. They are not associated with meals. She has a history of GERD and has been maintained on pantoprazole for many years with very good control of her reflux symptoms. Her typical reflux symptoms are heartburn and regurgitation. She had a mild flare of these typical reflux symptoms last week after eating spicy food that was associated with some transient epigastric pain. She clearly can differentiate the 2 sets of symptoms and they do not appear to be related. She was previously followed by Dr. Howell Rucks and underwent upper endoscopy and colonoscopy in February 2013. Patient reports a small hiatal hernia and maybe 1 or 2 polyps on colonoscopy. We do not have records available today. In addition she underwent a CT scan of the chest in 01/2014 that revealed possible gallstones. Abdominal ultrasound in 04/2012 did not show gallstones. Denies weight loss, constipation, diarrhea, change in stool caliber, melena, hematochezia, nausea, vomiting, dysphagia.   Allergies  Allergen Reactions  . Topamax [Topiramate] Other (See Comments)    :Stroke like symptoms  . Advil [Ibuprofen] Hives    Has tolerated Voltaren topical as well as aspirin.  Tori Milks [Naproxen Sodium] Hives    Has tolerated Voltaren topical as well as aspirin.  . Bee Venom Swelling  . Echinacea Hives  . Other Other (See Comments)    Feathers cause sinus congestion  . Sulfa Antibiotics Hives   Outpatient Prescriptions Prior to Visit  Medication Sig Dispense Refill  . acetaminophen (TYLENOL) 500 MG tablet Take 1,000 mg by mouth 2 (two) times daily as needed for pain.    Marland Kitchen aspirin EC 81 MG tablet Take 1 tablet (81 mg total) by mouth daily.      . Cholecalciferol (VITAMIN D-3) 1000 UNITS CAPS Take 1 capsule by mouth daily.     . citalopram (CELEXA) 20 MG tablet Take 20 mg by mouth every evening.      Marland Kitchen CRANBERRY-VITAMIN C PO Take 4,200 mg by mouth daily.    . Cyanocobalamin (VITAMIN B-12) 2500 MCG SUBL Place 2,500 mcg under the tongue daily.    Marland Kitchen eletriptan (RELPAX) 40 MG tablet Take 1 tablet (40 mg total) by mouth every 2 (two) hours as needed for migraine. 8 tablet 5  . loratadine (CLARITIN) 10 MG tablet Take 10-40 mg as needed    . Lutein-Zeaxanthin 25-5 MG CAPS Take 1 capsule by mouth daily.    Marland Kitchen LYRICA 200 MG capsule TAKE ONE (1) CAPSULE BY MOUTH EACH EVENING (Patient taking differently: TAKE ONE (1) CAPSULE BY MOUTH EACH MORNING) 30 capsule 3  . metoprolol succinate (TOPROL-XL) 25 MG 24 hr tablet Take 1/2 tablet by mouth once daily for one week then increase to one tablet by mouth once daily 30 tablet 6  . Multiple Vitamin (MULTIVITAMIN WITH MINERALS) TABS Take 1 tablet by mouth daily.    . nitroGLYCERIN (NITROSTAT) 0.4 MG SL tablet Place 1 tablet (0.4 mg total) under the tongue every 5 (five) minutes as needed. (Patient taking differently: Place 0.4 mg under the tongue every 5 (five) minutes as needed for chest pain. ) 25 tablet 3  . nystatin cream (MYCOSTATIN) Apply 1 application topically daily as needed (for itching).    . Omega-3  Fatty Acids (FISH OIL) 1200 MG CAPS Take 1,200 mg by mouth daily.    Marland Kitchen oxybutynin (DITROPAN-XL) 10 MG 24 hr tablet Take 10 mg by mouth daily.    . pantoprazole (PROTONIX) 40 MG tablet Take 40 mg by mouth every morning.     . simvastatin (ZOCOR) 20 MG tablet Take 20 mg by mouth daily.     Marland Kitchen spironolactone (ALDACTONE) 25 MG tablet Take 25 mg by mouth daily.     No facility-administered medications prior to visit.   Past Medical History  Diagnosis Date  . Family history of anesthesia complication     father has a severe hard time waking up  . Anxiety   . Mental disorder   . GERD (gastroesophageal  reflux disease)   . Arthritis   . Hard of hearing   . Migraine headache   . Obesity   . Depression   . Lateral epicondylitis of right elbow   . Dyslipidemia   . History of frequent urinary tract infections   . IBS (irritable bowel syndrome)   . Meralgia paresthetica of right side 10/02/2012  . Osteoarthritis   . Gallstones     a. Seen on CT 01/2014.  Marland Kitchen Dyspnea     a. Echo 10/2013: mild LVH, EF 55-60%, mildly dilated LA. b. pBNP normal 12/2013. c. Nuc 12/2013: low risk with anterior breast attenuation and possible small area of inferoapical ischemia. Technically limited. EF 61%. d. CTA 01/2014: no PE. Mosiac perfusion of lungs may indiate small airways disease or perfusion abnormalities. Question small gallstones within gb."  . Urinary tract infection    Past Surgical History  Procedure Laterality Date  . Joint replacement    . Knee arthroscopy Left 12/12  . Carpel tunnel rel Right 4/12  . Rotator cuff repair Left 6/11  . Plantar fascia release Right 12/10  . Bladder suspension  6/10  . Carpel tunnel left/right  7/08, 8/08 Bilateral 8/08 and 7/08  . Knee arthroscopy Right 12/06  . Tennis elbow release Right 7/04  . Abdominal hysterectomy  1/04    partial  . Cardiac catheterization  9/03  . Total knee arthroplasty Left 09/10/2012    Procedure: TOTAL KNEE ARTHROPLASTY- left;  Surgeon: Garald Balding, MD;  Location: [facility];  Service: Orthopedics;  Laterality: Left;  Left total knee arthroplasty  . Left heart catheterization with coronary angiogram N/A 03/10/2014    Procedure: LEFT HEART CATHETERIZATION WITH CORONARY ANGIOGRAM;  Surgeon: Sinclair Grooms, MD;  Location: [facility] CATH LAB;  Service: Cardiovascular;  Laterality: N/A;   History   Social History  . Marital Status: Married    Spouse Name: Remo Lipps  . Number of Children: 2  . Years of Education: 16   Occupational History  . [employer]    Social History Main Topics  . Smoking status: Passive Smoke Exposure - Never Smoker  -- 0.00 packs/day  . Smokeless tobacco: Never Used     Comment: Secondhand - from family growing up, workplace intermittently  . Alcohol Use: 0.0 oz/week    0 Standard drinks or equivalent per week     Comment: occasionally - intermittent, no more than twice a week  . Drug Use: No  . Sexual Activity: Not on file   Other Topics Concern  . None   Social History Narrative   Patient is married Remo Lipps) and lives at home with her husband and child.   Patient has two children.   Patient has a college education.  Patient is right-handed.   Patient drinks one cup of coffee, tea daily.   Family History  Problem Relation Age of Onset  . Hypertension Mother   . Hypertension Father   . Coronary artery disease Father   . Migraines Father   . Kidney failure Brother   . Hypertension Brother   . Migraines Brother   . Breast cancer Other     Niece with breast cancer  . Migraines Daughter   . Stroke Mother   . Colon cancer Paternal Grandmother   . Colon polyps Neg Hx   . Pancreatic cancer Paternal Grandmother   . Stomach cancer Paternal Grandmother   . Esophageal cancer Neg Hx   . Clotting disorder Father   . Liver disease Mother     Abcess     Review of Systems: Pertinent positive and negative review of systems were noted in the above HPI section. All other review of systems were otherwise negative.  Physical Exam: General: Well developed, well nourished, obese, no acute distress Head: Normocephalic and atraumatic Eyes:  sclerae anicteric, EOMI Ears: Normal auditory acuity Mouth: No deformity or lesions Neck: Supple, no masses or thyromegaly Lungs: Clear throughout to auscultation. Chest not tender Heart: Regular rate and rhythm; no murmurs, rubs or bruits Abdomen: Soft, non tender and non distended. No masses, hepatosplenomegaly or hernias noted. Normal Bowel sounds Musculoskeletal: Symmetrical with no gross deformities  Skin: No lesions on visible extremities Pulses:  Normal  pulses noted Extremities: No clubbing, cyanosis, edema or deformities noted Neurological: Alert oriented x 4, grossly nonfocal Cervical Nodes:  No significant cervical adenopathy Inguinal Nodes: No significant inguinal adenopathy Psychological:  Alert and cooperative. Normal mood and affect  Assessment and Recommendations:  1. Neck tightness, chest tightness, SOB for several months. Not associated with meals and not resonsive to PPI therapy. These symptoms do not appear to be related to GERD or other GI disorder. Further evaluation per Cardiology and PCP.  2. GERD. Well controlled. Trial of increasing pantoprazole to 40 mg bid for 2 months to determine if neck pain and chest pain symptoms respond. Request records of prior EGD and colonoscopy from Dr. Howell Rucks.  3. Possible cholelithiasis on recent CT. One episode of epigastric pain associated with spicy foods and a flare of her reflux last week. Schedule Abd [place].   cc: Mauricio Po, Fnp [address] [place], [place] [address]

## 2014-05-08 NOTE — Patient Instructions (Signed)
Increase your pantoprazole to 40 mg one tablet by mouth twice daily. A new prescription has been sent to your pharmacy.   Patient advised to avoid spicy, acidic, citrus, chocolate, mints, fruit and fruit juices.  Limit the intake of caffeine, alcohol and Soda.  Don't exercise too soon after eating.  Don't lie down within 3-4 hours of eating.  Elevate the head of your bed.  You have been scheduled for an abdominal ultrasound at [facility] (1st floor of hospital) on 05/11/14 at 9:00am. Please arrive 15 minutes prior to your appointment for registration. Make certain not to have anything to eat or drink 6 hours prior to your appointment. Should you need to reschedule your appointment, please contact radiology at [phone]. This test typically takes about 30 minutes to perform.  Thank you for choosing me and [facility].  Pricilla Riffle. Dagoberto Ligas., MD., Marval Regal  cc: Mauricio Po, FNP

## 2014-05-11 ENCOUNTER — Ambulatory Visit (HOSPITAL_COMMUNITY)
Admission: RE | Admit: 2014-05-11 | Discharge: 2014-05-11 | Disposition: A | Discharge status: Home / Self Care | Payer: 59 | Source: Ambulatory Visit | Attending: Gastroenterology | Admitting: Gastroenterology

## 2014-05-11 DIAGNOSIS — K8020 Calculus of gallbladder without cholecystitis without obstruction: Secondary | ICD-10-CM | POA: Diagnosis not present

## 2014-05-11 DIAGNOSIS — R109 Unspecified abdominal pain: Secondary | ICD-10-CM | POA: Diagnosis present

## 2014-05-11 DIAGNOSIS — R932 Abnormal findings on diagnostic imaging of liver and biliary tract: Secondary | ICD-10-CM | POA: Diagnosis not present

## 2014-05-11 DIAGNOSIS — R935 Abnormal findings on diagnostic imaging of other abdominal regions, including retroperitoneum: Secondary | ICD-10-CM

## 2014-05-21 ENCOUNTER — Ambulatory Visit: Payer: 59 | Admitting: Internal Medicine

## 2014-05-28 ENCOUNTER — Encounter (INDEPENDENT_AMBULATORY_CARE_PROVIDER_SITE_OTHER): Payer: Self-pay | Admitting: General Surgery

## 2014-05-28 ENCOUNTER — Other Ambulatory Visit (INDEPENDENT_AMBULATORY_CARE_PROVIDER_SITE_OTHER): Payer: Self-pay | Admitting: General Surgery

## 2014-05-28 NOTE — Progress Notes (Signed)
Patient ID: Heather Frederick, female   DOB: April 16, 1955, 59 y.o.   MRN: [id number]  [place] 05/28/2014 2:30 PM Location: [facility] Patient #: [id number] DOB: 1955-06-18 Married / Language: [name] / Race: White Female History of Present Illness Heather Hollingshead MD; 05/28/2014 3:12 PM) Patient words: gallbladder.  The patient is a 59 year old female    Note:She is referred by Dr. Fuller Plan because of upper abdominal pain and cholelithiasis. She's had typical gastroesophageal reflux disease symptoms for many years. Her symptoms have been well controlled with a proton pump inhibitor. Recently, she developed some epigastric abdominal pain/lower chest pain after eating some spicy food. She had previously had an ultrasound from 2014 demonstrating a normal gallbladder. She had a full cardiac workup which was unremarkable. A current ultrasound demonstrates multiple gallstones with gallbladder wall thickening. Common bile duct diameter is normal. She's also had slight worsening of her recent colic symptoms ever since she had the severe epigastric pain. She's been sent over here to discuss cholecystectomy. There is a strong family history of gallbladder disease.  Other Problems Marjean Donna, CMA; 05/28/2014 2:30 PM) Anxiety Disorder Arthritis Back Pain Bladder Problems Chest pain Cholelithiasis Depression Gastroesophageal Reflux Disease Hemorrhoids Hypercholesterolemia Migraine Headache Other disease, cancer, significant illness  Past Surgical History Marjean Donna, CMA; 05/28/2014 2:30 PM) Colon Polyp Removal - Colonoscopy Foot Surgery Bilateral. Hemorrhoidectomy Hysterectomy (not due to cancer) - Partial Knee Surgery Bilateral. Oral Surgery Shoulder Surgery Left.  Diagnostic Studies History Marjean Donna, CMA; 05/28/2014 2:30 PM) Colonoscopy 1-5 years ago Mammogram within last year Pap Smear 1-5 years ago  Allergies Marjean Donna, CMA; 05/28/2014  2:33 PM) Topamax *ANTICONVULSANTS* Advil *ANALGESICS - ANTI-INFLAMMATORY* Aleve *ANALGESICS - ANTI-INFLAMMATORY* Sulfabenzamide *CHEMICALS* Echinacea *ALTERNATIVE MEDICINES* Bee Pollen *NUTRIENTS*  Medication History Marjean Donna, CMA; 05/28/2014 2:36 PM) Lyrica (200MG  Capsule, Oral) Active. Citalopram Hydrobromide (20MG  Tablet, Oral) Active. Metoprolol Succinate ER (25MG  Tablet ER 24HR, Oral) Active. Pantoprazole Sodium (40MG  Tablet DR, Oral) Active. Simvastatin (20MG  Tablet, Oral) Active. Relpax (40MG  Tablet, Oral) Active. Nitrostat (0.3MG  Tab Sublingual, Sublingual) Active. Loratadine (10MG  Tablet, Oral) Active. Fish Oil Active. Vitamin D3 High Potency (1000UNIT Capsule, Oral) Active. Aspirin EC (81MG  Tablet DR, Oral) Active. Cranberry (125MG  Tablet, Oral) Active. Multivitamins (Oral) Active. Spironolactone (25MG  Tablet, Oral) Active. Methyl B-12 (1000MCG Lozenge, Oral) Active. Medications Reconciled  Social History Marjean Donna, CMA; 05/28/2014 2:30 PM) Alcohol use Occasional alcohol use. Caffeine use Tea. No drug use Tobacco use Never smoker.  Family History Marjean Donna, [place]; 05/28/2014 2:30 PM) Anesthetic complications Father. Arthritis Father, Mother. Breast Cancer Family Members In General. Cerebrovascular Accident Mother. Colon Cancer Family Members In General. Depression Father, Mother. Diabetes Mellitus Family Members In General, Father. Heart Disease Brother, Family Members In General, Father, Mother. Heart disease in female family member before age 78 Heart disease in female family member before age 11 Hypertension Brother, Mother. Kidney Disease Brother, Family Members In General. Melanoma Mother. Migraine Headache Brother, Daughter. Thyroid problems Mother.  Pregnancy / Birth History Marjean Donna, [place]; 05/28/2014 2:30 PM) Age at menarche 44 years. Age of menopause 78-50 Contraceptive History Oral  contraceptives. Gravida 2 Maternal age 20-25 Para 2 Regular periods     Review of Systems ([place]; 05/28/2014 2:30 PM) General Present- Weight Gain. Not Present- Appetite Loss, Chills, Fatigue, Fever, Night Sweats and Weight Loss. Skin Not Present- Change in Wart/Mole, Dryness, Hives, Jaundice, New Lesions, Non-Healing Wounds, Rash and Ulcer. HEENT Present- Hearing Loss, Oral Ulcers and Wears glasses/contact lenses. Not Present- Earache,  Hoarseness, Nose Bleed, Ringing in the Ears, Seasonal Allergies, Sinus Pain, Sore Throat, Visual Disturbances and Yellow Eyes. Respiratory Present- Chronic Cough. Not Present- Bloody sputum, Difficulty Breathing, Snoring and Wheezing. Breast Not Present- Breast Mass, Breast Pain, Nipple Discharge and Skin Changes. Cardiovascular Present- Chest Pain and Shortness of Breath. Not Present- Difficulty Breathing Lying Down, Leg Cramps, Palpitations, Rapid Heart Rate and Swelling of Extremities. Gastrointestinal Present- Abdominal Pain, Difficulty Swallowing, Excessive gas and Indigestion. Not Present- Bloating, Bloody Stool, Change in Bowel Habits, Chronic diarrhea, Constipation, Gets full quickly at meals, Hemorrhoids, Nausea, Rectal Pain and Vomiting. Female Genitourinary Present- Urgency. Not Present- Frequency, Nocturia, Painful Urination and Pelvic Pain. Musculoskeletal Present- Back Pain and Joint Pain. Not Present- Joint Stiffness, Muscle Pain, Muscle Weakness and Swelling of Extremities. Neurological Present- Headaches. Not Present- Decreased Memory, Fainting, Numbness, Seizures, Tingling, Tremor, Trouble walking and Weakness. Psychiatric Present- Depression. Not Present- Anxiety, Bipolar, Change in Sleep Pattern, Fearful and Frequent crying. Endocrine Not Present- Cold Intolerance, Excessive Hunger, Hair Changes, Heat Intolerance, Hot flashes and New Diabetes. Hematology Not Present- Easy Bruising, Excessive bleeding, Gland problems, HIV and  Persistent Infections.  Vitals (Sonya Bynum CMA; 05/28/2014 2:30 PM) 05/28/2014 2:30 PM Weight: 309 lb Height: 65in Body Surface Area: 2.54 m Body Mass Index: 51.42 kg/m Temp.: 97.82F(Temporal)  Pulse: 84 (Regular)  BP: 146/84 (Sitting, Left Arm, Standard)     Physical Exam Heather Hollingshead MD; 05/28/2014 3:13 PM)  The physical exam findings are as follows: Note:General: Obese female in NAD. Pleasant and cooperative.  HEENT: [place]/AT, no facial masses  EYES: EOMI, no icterus  CV: RRR, no murmur, no JVD.  CHEST: Breath sounds equal and clear. Respirations nonlabored.  BREASTS: Symmetrical in size. No dominant masses, nipple discharge or suspicious skin lesions.  ABDOMEN: Soft, obese, nontender, nondistended, no masses, no organomegaly  SKIN: No jaundice or suspicious rashes.  NEUROLOGIC: Alert and oriented, answers questions appropriately, normal gait and station.  PSYCHIATRIC: Normal mood, affect , and behavior.    Assessment & Plan Heather Hollingshead MD; 05/28/2014 3:14 PM)  SYMPTOMATIC CHOLELITHIASIS (574.20  K80.20) Impression: Her symptoms are different than her gastroesophageal reflux symptoms. She has had interval development of gallbladder wall thickening and gallstones since 2014. I told her I wasn't sure that her reflux symptoms would be any better following cholecystectomy.  Plan: We discussed laparoscopic cholecystectomy with cholangiogram and she is in agreement with this. I have explained the procedure, risks, and aftercare of cholecystectomy. Risks include but are not limited to bleeding, infection, wound problems, anesthesia, diarrhea, bile leak, injury to common bile duct/liver/intestine. She seems to understand and agrees to proceed.    Jackolyn Confer, MD

## 2014-06-08 NOTE — Patient Instructions (Addendum)
Heather Frederick  06/08/2014   Your procedure is scheduled on: 06/11/2014    Report to [facility] Main  Entrance and follow signs to               [place] at      1000 AM.  Call this number if you have problems the morning of surgery [phone]   Remember:  Do not eat food or drink liquids :After Midnight.     Take these medicines the morning of surgery with A SIP OF WATER: Claritin if needed, Toprol, Ditropan, Protonix                    Claritin if needed, Toprol, Ditropan, Protonix             You may not have any metal on your body including hair pins and              piercings  Do not wear jewelry, make-up, lotions, powders or perfumes., deodorant.               Do not wear nail polish.  Do not shave  48 hours prior to surgery.              Do not bring valuables to the hospital. [place].  Contacts, dentures or bridgework may not be worn into surgery.       Patients discharged the day of surgery will not be allowed to drive home.  Name and phone number of your driver:  Special Instructions: coughing and deep breathing exercises, leg exercises               Please read over the following fact sheets you were given: _____________________________________________________________________             [facility] - Preparing for Surgery Before surgery, you can play an important role.  Because skin is not sterile, your skin needs to be as free of germs as possible.  You can reduce the number of germs on your skin by washing with CHG (chlorahexidine gluconate) soap before surgery.  CHG is an antiseptic cleaner which kills germs and bonds with the skin to continue killing germs even after washing. Please DO NOT use if you have an allergy to CHG or antibacterial soaps.  If your skin becomes reddened/irritated stop using the CHG and inform your nurse when you arrive at Short Stay. Do not shave  (including legs and underarms) for at least 48 hours prior to the first CHG shower.  You may shave your face/neck. Please follow these instructions carefully:  1.  Shower with CHG Soap the night before surgery and the  morning of Surgery.  2.  If you choose to wash your hair, wash your hair first as usual with your  normal  shampoo.  3.  After you shampoo, rinse your hair and body thoroughly to remove the  shampoo.                           4.  Use CHG as you would any other liquid soap.  You can apply chg directly  to the skin and wash  Gently with a scrungie or clean washcloth.  5.  Apply the CHG Soap to your body ONLY FROM THE NECK DOWN.   Do not use on face/ open                           Wound or open sores. Avoid contact with eyes, ears mouth and genitals (private parts).                       Wash face,  Genitals (private parts) with your normal soap.             6.  Wash thoroughly, paying special attention to the area where your surgery  will be performed.  7.  Thoroughly rinse your body with warm water from the neck down.  8.  DO NOT shower/wash with your normal soap after using and rinsing off  the CHG Soap.                9.  Pat yourself dry with a clean towel.            10.  Wear clean pajamas.            11.  Place clean sheets on your bed the night of your first shower and do not  sleep with pets. Day of Surgery : Do not apply any lotions/deodorants the morning of surgery.  Please wear clean clothes to the hospital/surgery center.  FAILURE TO FOLLOW THESE INSTRUCTIONS MAY RESULT IN THE CANCELLATION OF YOUR SURGERY PATIENT SIGNATURE_________________________________  NURSE SIGNATURE__________________________________  ________________________________________________________________________

## 2014-06-09 ENCOUNTER — Encounter (HOSPITAL_COMMUNITY)
Admission: RE | Admit: 2014-06-09 | Discharge: 2014-06-09 | Disposition: A | Discharge status: Home / Self Care | Payer: 59 | Source: Ambulatory Visit | Attending: General Surgery | Admitting: General Surgery

## 2014-06-09 ENCOUNTER — Encounter (HOSPITAL_COMMUNITY): Payer: Self-pay

## 2014-06-09 DIAGNOSIS — E780 Pure hypercholesterolemia: Secondary | ICD-10-CM | POA: Diagnosis not present

## 2014-06-09 DIAGNOSIS — G43909 Migraine, unspecified, not intractable, without status migrainosus: Secondary | ICD-10-CM | POA: Diagnosis not present

## 2014-06-09 DIAGNOSIS — G709 Myoneural disorder, unspecified: Secondary | ICD-10-CM | POA: Diagnosis not present

## 2014-06-09 DIAGNOSIS — F419 Anxiety disorder, unspecified: Secondary | ICD-10-CM | POA: Diagnosis not present

## 2014-06-09 DIAGNOSIS — M1990 Unspecified osteoarthritis, unspecified site: Secondary | ICD-10-CM | POA: Diagnosis not present

## 2014-06-09 DIAGNOSIS — K219 Gastro-esophageal reflux disease without esophagitis: Secondary | ICD-10-CM | POA: Diagnosis not present

## 2014-06-09 DIAGNOSIS — F329 Major depressive disorder, single episode, unspecified: Secondary | ICD-10-CM | POA: Diagnosis not present

## 2014-06-09 DIAGNOSIS — K8010 Calculus of gallbladder with chronic cholecystitis without obstruction: Secondary | ICD-10-CM | POA: Diagnosis present

## 2014-06-09 DIAGNOSIS — Z6841 Body Mass Index (BMI) 40.0 and over, adult: Secondary | ICD-10-CM | POA: Diagnosis not present

## 2014-06-09 DIAGNOSIS — Z7982 Long term (current) use of aspirin: Secondary | ICD-10-CM | POA: Diagnosis not present

## 2014-06-09 DIAGNOSIS — I739 Peripheral vascular disease, unspecified: Secondary | ICD-10-CM | POA: Diagnosis not present

## 2014-06-09 DIAGNOSIS — Z79899 Other long term (current) drug therapy: Secondary | ICD-10-CM | POA: Diagnosis not present

## 2014-06-09 DIAGNOSIS — I10 Essential (primary) hypertension: Secondary | ICD-10-CM | POA: Diagnosis not present

## 2014-06-09 DIAGNOSIS — R0602 Shortness of breath: Secondary | ICD-10-CM | POA: Diagnosis not present

## 2014-06-09 HISTORY — DX: Raynaud's syndrome without gangrene: I73.00

## 2014-06-09 HISTORY — DX: Essential (primary) hypertension: I10

## 2014-06-09 LAB — CBC WITH DIFFERENTIAL/PLATELET
Basophils Absolute: 0 10*3/uL (ref 0.0–0.1)
Basophils Relative: 0 % (ref 0–1)
EOS PCT: 3 % (ref 0–5)
Eosinophils Absolute: 0.2 10*3/uL (ref 0.0–0.7)
HEMATOCRIT: 43.8 % (ref 36.0–46.0)
HEMOGLOBIN: 14.6 g/dL (ref 12.0–15.0)
LYMPHS ABS: 1.9 10*3/uL (ref 0.7–4.0)
LYMPHS PCT: 34 % (ref 12–46)
MCH: 30 pg (ref 26.0–34.0)
MCHC: 33.3 g/dL (ref 30.0–36.0)
MCV: 89.9 fL (ref 78.0–100.0)
Monocytes Absolute: 0.6 10*3/uL (ref 0.1–1.0)
Monocytes Relative: 11 % (ref 3–12)
NEUTROS PCT: 52 % (ref 43–77)
Neutro Abs: 2.9 10*3/uL (ref 1.7–7.7)
Platelets: 162 10*3/uL (ref 150–400)
RBC: 4.87 MIL/uL (ref 3.87–5.11)
RDW: 13.3 % (ref 11.5–15.5)
WBC: 5.5 10*3/uL (ref 4.0–10.5)

## 2014-06-09 LAB — PROTIME-INR
INR: 1.1 (ref 0.00–1.49)
Prothrombin Time: 14.3 seconds (ref 11.6–15.2)

## 2014-06-09 LAB — COMPREHENSIVE METABOLIC PANEL
ALBUMIN: 3.9 g/dL (ref 3.5–5.2)
ALK PHOS: 63 U/L (ref 39–117)
ALT: 40 U/L — ABNORMAL HIGH (ref 0–35)
ANION GAP: 9 (ref 5–15)
AST: 27 U/L (ref 0–37)
BUN: 12 mg/dL (ref 6–23)
CO2: 28 mmol/L (ref 19–32)
CREATININE: 0.87 mg/dL (ref 0.50–1.10)
Calcium: 9.2 mg/dL (ref 8.4–10.5)
Chloride: 104 mmol/L (ref 96–112)
GFR calc non Af Amer: 73 mL/min — ABNORMAL LOW (ref >90)
GFR, EST AFRICAN AMERICAN: 85 mL/min — AB (ref >90)
GLUCOSE: 108 mg/dL — AB (ref 70–99)
Potassium: 4.2 mmol/L (ref 3.5–5.1)
SODIUM: 141 mmol/L (ref 135–145)
TOTAL PROTEIN: 6.8 g/dL (ref 6.0–8.3)
Total Bilirubin: 0.8 mg/dL (ref 0.3–1.2)

## 2014-06-09 NOTE — Progress Notes (Addendum)
CXR- 07/09/13 EPIC  EKG- 05/07/14- EPIC  ECHO- 11/10/13 EPIC  Stress 01/27/14 EPIC  LOV- DR Daneen Schick- 05/07/14 EPIC  05/08/14- DDR Fuller Plan- LOV EPIC  Neurologist- LOV- 3/15/EPIC  Cath- 03/10/14 EPIC

## 2014-06-10 MED ORDER — DEXTROSE 5 % IV SOLN
3.0000 g | INTRAVENOUS | Status: AC
  Administered 2014-06-11: 3 g via INTRAVENOUS
  Filled 2014-06-10: qty 3000

## 2014-06-10 NOTE — Anesthesia Preprocedure Evaluation (Addendum)
Anesthesia Evaluation  Patient identified by MRN, date of birth, ID band Patient awake    Reviewed: Allergy & Precautions, NPO status   History of Anesthesia Complications Negative for: history of anesthetic complications  Airway Mallampati: III  TM Distance: <3 FB Neck ROM: Limited    Dental  (+) Teeth Intact   Pulmonary shortness of breath,    + decreased breath sounds      Cardiovascular hypertension, + Peripheral Vascular Disease Rhythm:Regular Rate:Normal     Neuro/Psych  Headaches, Anxiety  Neuromuscular disease    GI/Hepatic GERD-  ,  Endo/Other  Morbid obesity  Renal/GU      Musculoskeletal  (+) Arthritis -,   Abdominal (+) + obese,   Peds  Hematology   Anesthesia Other Findings   Reproductive/Obstetrics                           Anesthesia Physical Anesthesia Plan  ASA: III  Anesthesia Plan: General   Post-op Pain Management:    Induction: Intravenous  Airway Management Planned: Oral ETT and Video Laryngoscope Planned  Additional Equipment:   Intra-op Plan:   Post-operative Plan: Extubation in OR  Informed Consent: I have reviewed the patients History and Physical, chart, labs and discussed the procedure including the risks, benefits and alternatives for the proposed anesthesia with the patient or authorized representative who has indicated his/her understanding and acceptance.   Dental advisory given  Plan Discussed with: CRNA and Surgeon  Anesthesia Plan Comments:        Anesthesia Quick Evaluation

## 2014-06-11 ENCOUNTER — Ambulatory Visit (HOSPITAL_COMMUNITY): Payer: 59

## 2014-06-11 ENCOUNTER — Ambulatory Visit (HOSPITAL_COMMUNITY)
Admission: RE | Admit: 2014-06-11 | Discharge: 2014-06-12 | Disposition: A | Discharge status: Home / Self Care | Payer: 59 | Source: Ambulatory Visit | Attending: General Surgery | Admitting: General Surgery

## 2014-06-11 ENCOUNTER — Ambulatory Visit (HOSPITAL_COMMUNITY): Payer: 59 | Admitting: Anesthesiology

## 2014-06-11 ENCOUNTER — Encounter (HOSPITAL_COMMUNITY)
Admission: RE | Disposition: A | Discharge status: Home / Self Care | Payer: Self-pay | Source: Ambulatory Visit | Attending: General Surgery

## 2014-06-11 ENCOUNTER — Encounter (HOSPITAL_COMMUNITY): Payer: Self-pay | Admitting: Anesthesiology

## 2014-06-11 DIAGNOSIS — R0602 Shortness of breath: Secondary | ICD-10-CM | POA: Insufficient documentation

## 2014-06-11 DIAGNOSIS — F419 Anxiety disorder, unspecified: Secondary | ICD-10-CM | POA: Insufficient documentation

## 2014-06-11 DIAGNOSIS — G43909 Migraine, unspecified, not intractable, without status migrainosus: Secondary | ICD-10-CM | POA: Insufficient documentation

## 2014-06-11 DIAGNOSIS — K8010 Calculus of gallbladder with chronic cholecystitis without obstruction: Secondary | ICD-10-CM | POA: Diagnosis not present

## 2014-06-11 DIAGNOSIS — K8020 Calculus of gallbladder without cholecystitis without obstruction: Secondary | ICD-10-CM | POA: Diagnosis present

## 2014-06-11 DIAGNOSIS — K219 Gastro-esophageal reflux disease without esophagitis: Secondary | ICD-10-CM | POA: Insufficient documentation

## 2014-06-11 DIAGNOSIS — I10 Essential (primary) hypertension: Secondary | ICD-10-CM | POA: Insufficient documentation

## 2014-06-11 DIAGNOSIS — Z6841 Body Mass Index (BMI) 40.0 and over, adult: Secondary | ICD-10-CM | POA: Insufficient documentation

## 2014-06-11 DIAGNOSIS — F329 Major depressive disorder, single episode, unspecified: Secondary | ICD-10-CM | POA: Insufficient documentation

## 2014-06-11 DIAGNOSIS — M1990 Unspecified osteoarthritis, unspecified site: Secondary | ICD-10-CM | POA: Insufficient documentation

## 2014-06-11 DIAGNOSIS — G709 Myoneural disorder, unspecified: Secondary | ICD-10-CM | POA: Insufficient documentation

## 2014-06-11 DIAGNOSIS — I739 Peripheral vascular disease, unspecified: Secondary | ICD-10-CM | POA: Insufficient documentation

## 2014-06-11 DIAGNOSIS — E780 Pure hypercholesterolemia: Secondary | ICD-10-CM | POA: Insufficient documentation

## 2014-06-11 DIAGNOSIS — Z7982 Long term (current) use of aspirin: Secondary | ICD-10-CM | POA: Insufficient documentation

## 2014-06-11 DIAGNOSIS — Z79899 Other long term (current) drug therapy: Secondary | ICD-10-CM | POA: Insufficient documentation

## 2014-06-11 HISTORY — PX: CHOLECYSTECTOMY: SHX55

## 2014-06-11 SURGERY — LAPAROSCOPIC CHOLECYSTECTOMY WITH INTRAOPERATIVE CHOLANGIOGRAM
Anesthesia: General | Site: Abdomen

## 2014-06-11 MED ORDER — 0.9 % SODIUM CHLORIDE (POUR BTL) OPTIME
TOPICAL | Status: DC | PRN
  Administered 2014-06-11: 1000 mL

## 2014-06-11 MED ORDER — MIDAZOLAM HCL 2 MG/2ML IJ SOLN
INTRAMUSCULAR | Status: AC
  Filled 2014-06-11: qty 2

## 2014-06-11 MED ORDER — ONDANSETRON HCL 4 MG PO TABS: 4.0000 mg | ORAL_TABLET | Freq: Four times a day (QID) | ORAL | Status: DC | PRN

## 2014-06-11 MED ORDER — OXYCODONE HCL 5 MG PO TABS: 5.0000 mg | ORAL_TABLET | ORAL | Status: DC | PRN

## 2014-06-11 MED ORDER — METOCLOPRAMIDE HCL 5 MG/ML IJ SOLN
INTRAMUSCULAR | Status: DC | PRN
  Administered 2014-06-11: 10 mg via INTRAVENOUS

## 2014-06-11 MED ORDER — DEXTROSE IN LACTATED RINGERS 5 % IV SOLN: INTRAVENOUS | Status: DC

## 2014-06-11 MED ORDER — GLYCOPYRROLATE 0.2 MG/ML IJ SOLN
INTRAMUSCULAR | Status: AC
  Filled 2014-06-11: qty 4

## 2014-06-11 MED ORDER — PANTOPRAZOLE SODIUM 40 MG PO TBEC
40.0000 mg | DELAYED_RELEASE_TABLET | Freq: Two times a day (BID) | ORAL | Status: DC
  Filled 2014-06-11 (×2): qty 1

## 2014-06-11 MED ORDER — IOHEXOL 300 MG/ML  SOLN
INTRAMUSCULAR | Status: DC | PRN
  Administered 2014-06-11: 5.5 mL

## 2014-06-11 MED ORDER — PROPOFOL 10 MG/ML IV BOLUS
INTRAVENOUS | Status: DC | PRN
  Administered 2014-06-11: 200 mg via INTRAVENOUS

## 2014-06-11 MED ORDER — NEOSTIGMINE METHYLSULFATE 10 MG/10ML IV SOLN
INTRAVENOUS | Status: DC | PRN
  Administered 2014-06-11: 5 mg via INTRAVENOUS

## 2014-06-11 MED ORDER — SUCCINYLCHOLINE CHLORIDE 20 MG/ML IJ SOLN
INTRAMUSCULAR | Status: DC | PRN
  Administered 2014-06-11: 140 mg via INTRAVENOUS

## 2014-06-11 MED ORDER — MORPHINE SULFATE 10 MG/ML IJ SOLN: 2.0000 mg | INTRAMUSCULAR | Status: DC | PRN

## 2014-06-11 MED ORDER — ONDANSETRON HCL 4 MG/2ML IJ SOLN: 4.0000 mg | Freq: Four times a day (QID) | INTRAMUSCULAR | Status: DC | PRN

## 2014-06-11 MED ORDER — LACTATED RINGERS IV SOLN
INTRAVENOUS | Status: DC
  Administered 2014-06-11: 12:00:00 via INTRAVENOUS
  Administered 2014-06-11: 1000 mL via INTRAVENOUS

## 2014-06-11 MED ORDER — ROCURONIUM BROMIDE 100 MG/10ML IV SOLN
INTRAVENOUS | Status: AC
  Filled 2014-06-11: qty 1

## 2014-06-11 MED ORDER — OXYBUTYNIN CHLORIDE ER 10 MG PO TB24
10.0000 mg | ORAL_TABLET | Freq: Every day | ORAL | Status: DC
  Filled 2014-06-11 (×2): qty 1

## 2014-06-11 MED ORDER — ACETAMINOPHEN 650 MG RE SUPP
650.0000 mg | RECTAL | Status: DC | PRN
Start: 2014-06-11 — End: 2014-06-11
  Filled 2014-06-11: qty 1

## 2014-06-11 MED ORDER — OXYCODONE HCL 5 MG/5ML PO SOLN
5.0000 mg | Freq: Once | ORAL | Status: DC | PRN
  Filled 2014-06-11: qty 5

## 2014-06-11 MED ORDER — GLYCOPYRROLATE 0.2 MG/ML IJ SOLN
INTRAMUSCULAR | Status: DC | PRN
  Administered 2014-06-11: .8 mg via INTRAVENOUS

## 2014-06-11 MED ORDER — SODIUM CHLORIDE 0.9 % IJ SOLN: 3.0000 mL | INTRAMUSCULAR | Status: DC | PRN

## 2014-06-11 MED ORDER — NITROGLYCERIN 0.4 MG SL SUBL: 0.4000 mg | SUBLINGUAL_TABLET | SUBLINGUAL | Status: DC | PRN

## 2014-06-11 MED ORDER — FENTANYL CITRATE 0.05 MG/ML IJ SOLN
INTRAMUSCULAR | Status: AC
  Filled 2014-06-11: qty 5

## 2014-06-11 MED ORDER — METOPROLOL SUCCINATE ER 25 MG PO TB24
25.0000 mg | ORAL_TABLET | Freq: Every day | ORAL | Status: DC
  Filled 2014-06-11: qty 1

## 2014-06-11 MED ORDER — ELETRIPTAN HYDROBROMIDE 40 MG PO TABS
40.0000 mg | ORAL_TABLET | ORAL | Status: DC | PRN
  Administered 2014-06-11: 40 mg via ORAL
  Filled 2014-06-11: qty 1

## 2014-06-11 MED ORDER — EPHEDRINE SULFATE 50 MG/ML IJ SOLN
INTRAMUSCULAR | Status: DC | PRN
  Administered 2014-06-11 (×4): 5 mg via INTRAVENOUS

## 2014-06-11 MED ORDER — LIDOCAINE HCL (CARDIAC) 20 MG/ML IV SOLN
INTRAVENOUS | Status: AC
  Filled 2014-06-11: qty 5

## 2014-06-11 MED ORDER — PROMETHAZINE HCL 25 MG/ML IJ SOLN: 6.2500 mg | INTRAMUSCULAR | Status: DC | PRN

## 2014-06-11 MED ORDER — DEXAMETHASONE SODIUM PHOSPHATE 10 MG/ML IJ SOLN
INTRAMUSCULAR | Status: DC | PRN
  Administered 2014-06-11: 5 mg via INTRAVENOUS

## 2014-06-11 MED ORDER — ACETAMINOPHEN 325 MG PO TABS: 650.0000 mg | ORAL_TABLET | ORAL | Status: DC | PRN

## 2014-06-11 MED ORDER — LACTATED RINGERS IR SOLN
Status: DC | PRN
  Administered 2014-06-11: 1000 mL

## 2014-06-11 MED ORDER — ONDANSETRON HCL 4 MG/2ML IJ SOLN
INTRAMUSCULAR | Status: AC
Start: 2014-06-11 — End: 2014-06-11
  Filled 2014-06-11: qty 2

## 2014-06-11 MED ORDER — ROCURONIUM BROMIDE 100 MG/10ML IV SOLN
INTRAVENOUS | Status: DC | PRN
  Administered 2014-06-11: 5 mg via INTRAVENOUS
  Administered 2014-06-11: 15 mg via INTRAVENOUS
  Administered 2014-06-11: 25 mg via INTRAVENOUS

## 2014-06-11 MED ORDER — LIDOCAINE HCL (CARDIAC) 20 MG/ML IV SOLN
INTRAVENOUS | Status: DC | PRN
  Administered 2014-06-11: 60 mg via INTRAVENOUS

## 2014-06-11 MED ORDER — METOCLOPRAMIDE HCL 5 MG/ML IJ SOLN
INTRAMUSCULAR | Status: AC
  Filled 2014-06-11: qty 2

## 2014-06-11 MED ORDER — FENTANYL CITRATE 0.05 MG/ML IJ SOLN
INTRAMUSCULAR | Status: DC | PRN
  Administered 2014-06-11 (×2): 50 ug via INTRAVENOUS
  Administered 2014-06-11: 100 ug via INTRAVENOUS
  Administered 2014-06-11 (×4): 50 ug via INTRAVENOUS

## 2014-06-11 MED ORDER — NALOXONE HCL 0.4 MG/ML IJ SOLN
INTRAMUSCULAR | Status: DC | PRN
  Administered 2014-06-11: .16 ug via INTRAVENOUS

## 2014-06-11 MED ORDER — KCL-LACTATED RINGERS-D5W 20 MEQ/L IV SOLN
INTRAVENOUS | Status: DC
  Administered 2014-06-11: 75 mL/h via INTRAVENOUS
  Filled 2014-06-11 (×2): qty 1000

## 2014-06-11 MED ORDER — PROPOFOL 10 MG/ML IV BOLUS
INTRAVENOUS | Status: AC
  Filled 2014-06-11: qty 20

## 2014-06-11 MED ORDER — NEOSTIGMINE METHYLSULFATE 10 MG/10ML IV SOLN
INTRAVENOUS | Status: AC
  Filled 2014-06-11: qty 1

## 2014-06-11 MED ORDER — OXYCODONE-ACETAMINOPHEN 5-325 MG PO TABS: 1.0000 | ORAL_TABLET | ORAL | Status: DC | PRN

## 2014-06-11 MED ORDER — BUPIVACAINE-EPINEPHRINE (PF) 0.25% -1:200000 IJ SOLN
INTRAMUSCULAR | Status: AC
  Filled 2014-06-11: qty 30

## 2014-06-11 MED ORDER — BUPIVACAINE HCL (PF) 0.5 % IJ SOLN
INTRAMUSCULAR | Status: AC
  Filled 2014-06-11: qty 30

## 2014-06-11 MED ORDER — OXYCODONE HCL 5 MG PO TABS: 5.0000 mg | ORAL_TABLET | Freq: Once | ORAL | Status: DC | PRN

## 2014-06-11 MED ORDER — DEXAMETHASONE SODIUM PHOSPHATE 10 MG/ML IJ SOLN
INTRAMUSCULAR | Status: AC
  Filled 2014-06-11: qty 1

## 2014-06-11 MED ORDER — HYDROMORPHONE HCL 1 MG/ML IJ SOLN
INTRAMUSCULAR | Status: AC
  Filled 2014-06-11: qty 1

## 2014-06-11 MED ORDER — HYDROMORPHONE HCL 1 MG/ML IJ SOLN
0.2500 mg | INTRAMUSCULAR | Status: DC | PRN
  Administered 2014-06-11: 0.25 mg via INTRAVENOUS

## 2014-06-11 MED ORDER — MIDAZOLAM HCL 5 MG/5ML IJ SOLN
INTRAMUSCULAR | Status: DC | PRN
  Administered 2014-06-11: 2 mg via INTRAVENOUS

## 2014-06-11 SURGICAL SUPPLY — 40 items
APL SKNCLS STERI-STRIP NONHPOA (GAUZE/BANDAGES/DRESSINGS) ×1
APPLIER CLIP 5 13 M/L LIGAMAX5 (MISCELLANEOUS) ×3
APR CLP MED LRG 5 ANG JAW (MISCELLANEOUS) ×1
BAG SPEC RTRVL LRG 6X4 10 (ENDOMECHANICALS) ×1
BENZOIN TINCTURE PRP APPL 2/3 (GAUZE/BANDAGES/DRESSINGS) ×3 IMPLANT
CHLORAPREP W/TINT 26ML (MISCELLANEOUS) ×3 IMPLANT
CLIP APPLIE 5 13 M/L LIGAMAX5 (MISCELLANEOUS) ×1 IMPLANT
CLOSURE STERI-STRIP 1/4X4 (GAUZE/BANDAGES/DRESSINGS) ×2 IMPLANT
CLOSURE WOUND 1/2 X4 (GAUZE/BANDAGES/DRESSINGS) ×1
COVER MAYO STAND STRL (DRAPES) ×3 IMPLANT
DECANTER SPIKE VIAL GLASS SM (MISCELLANEOUS) ×3 IMPLANT
DISSECTOR BLUNT TIP ENDO 5MM (MISCELLANEOUS) IMPLANT
DRAPE C-ARM 42X120 X-RAY (DRAPES) ×3 IMPLANT
DRAPE LAPAROSCOPIC ABDOMINAL (DRAPES) ×3 IMPLANT
DRAPE UTILITY XL STRL (DRAPES) ×3 IMPLANT
DRSG TEGADERM 2-3/8X2-3/4 SM (GAUZE/BANDAGES/DRESSINGS) ×5 IMPLANT
ELECT REM PT RETURN 9FT ADLT (ELECTROSURGICAL) ×3
ELECTRODE REM PT RTRN 9FT ADLT (ELECTROSURGICAL) ×1 IMPLANT
ENDOLOOP SUT PDS II  0 18 (SUTURE)
ENDOLOOP SUT PDS II 0 18 (SUTURE) IMPLANT
GAUZE SPONGE 2X2 8PLY STRL LF (GAUZE/BANDAGES/DRESSINGS) ×1 IMPLANT
GLOVE ECLIPSE 8.0 STRL XLNG CF (GLOVE) ×3 IMPLANT
GLOVE INDICATOR 8.0 STRL GRN (GLOVE) ×3 IMPLANT
GOWN STRL REUS W/TWL XL LVL3 (GOWN DISPOSABLE) ×9 IMPLANT
HEMOSTAT SNOW SURGICEL 2X4 (HEMOSTASIS) ×2 IMPLANT
KIT BASIN OR (CUSTOM PROCEDURE TRAY) ×3 IMPLANT
POUCH SPECIMEN RETRIEVAL 10MM (ENDOMECHANICALS) ×3 IMPLANT
SCISSORS LAP 5X35 DISP (ENDOMECHANICALS) ×3 IMPLANT
SET CHOLANGIOGRAPH MIX (MISCELLANEOUS) ×3 IMPLANT
SET IRRIG TUBING LAPAROSCOPIC (IRRIGATION / IRRIGATOR) ×3 IMPLANT
SLEEVE XCEL OPT CAN 5 100 (ENDOMECHANICALS) ×3 IMPLANT
SPONGE GAUZE 2X2 STER 10/PKG (GAUZE/BANDAGES/DRESSINGS) ×2
STRIP CLOSURE SKIN 1/2X4 (GAUZE/BANDAGES/DRESSINGS) ×2 IMPLANT
SUT MNCRL AB 4-0 PS2 18 (SUTURE) ×3 IMPLANT
TOWEL OR 17X26 10 PK STRL BLUE (TOWEL DISPOSABLE) ×3 IMPLANT
TOWEL OR NON WOVEN STRL DISP B (DISPOSABLE) ×3 IMPLANT
TRAY LAPAROSCOPIC (CUSTOM PROCEDURE TRAY) ×3 IMPLANT
TROCAR BLADELESS OPT 5 100 (ENDOMECHANICALS) ×3 IMPLANT
TROCAR XCEL BLUNT TIP 100MML (ENDOMECHANICALS) ×3 IMPLANT
TROCAR XCEL NON-BLD 11X100MML (ENDOMECHANICALS) IMPLANT

## 2014-06-11 NOTE — Progress Notes (Signed)
Pt continues to have difficulty maintaining O2 sats. On 1L [place] with sats 86% to 87% when not deep breathing. Notified Drs Orene Desanctis and [employer]. Pt for overnight observation.

## 2014-06-11 NOTE — Interval H&P Note (Signed)
  History and Physical Interval Note:  06/11/2014 10:30 AM  Heather Frederick  has presented today for surgery, with the diagnosis of symptomatic cholelithiasis  The various methods of treatment have been discussed with the patient and family. After consideration of risks, benefits and other options for treatment, the patient has consented to  Procedure(s): LAPAROSCOPIC CHOLECYSTECTOMY WITH INTRAOPERATIVE CHOLANGIOGRAM (N/A) as a surgical intervention .  The patient's history has been reviewed, patient examined, no change in status, stable for surgery.  I have reviewed the patient's chart and labs.  Questions were answered to the patient's satisfaction.     Nayden Czajka [name]

## 2014-06-11 NOTE — Progress Notes (Signed)
Oxygen saturations drop into the high-mid 80s when she dozes off.  Back into the 90s when she is awake.  Suspect this happens at home as well.  Have admitted her for overnight observation.  Will start IS.

## 2014-06-11 NOTE — Interval H&P Note (Signed)
History and Physical Interval Note:  06/11/2014 10:55 AM  Heather Frederick  has presented today for surgery, with the diagnosis of symptomatic cholelithiasis  The various methods of treatment have been discussed with the patient and family. After consideration of risks, benefits and other options for treatment, the patient has consented to  Procedure(s): LAPAROSCOPIC CHOLECYSTECTOMY WITH INTRAOPERATIVE CHOLANGIOGRAM (N/A) as a surgical intervention .  The patient's history has been reviewed, patient examined, no change in status, stable for surgery.  I have reviewed the patient's chart and labs.  Questions were answered to the patient's satisfaction.     [name]

## 2014-06-11 NOTE — Transfer of Care (Signed)
Immediate Anesthesia Transfer of Care Note  Patient: Heather Frederick  Procedure(s) Performed: Procedure(s): LAPAROSCOPIC CHOLECYSTECTOMY WITH ATTEMPTED INTRAOPERATIVE CHOLANGIOGRAM (N/A)  Patient Location: PACU  Anesthesia Type:General  Level of Consciousness: awake, alert , oriented and patient cooperative  Airway & Oxygen Therapy: Patient Spontanous Breathing and Patient connected to face mask oxygen  Post-op Assessment: Report given to RN and Post -op Vital signs reviewed and unstable, Anesthesiologist notified  Post vital signs: Reviewed and stable  Last Vitals:  Filed Vitals:   06/11/14 0948  BP: 111/62  Pulse: 67  Temp: 36.4 C  Resp: 16    Complications: No apparent anesthesia complications

## 2014-06-11 NOTE — H&P (View-Only) (Signed)
Patient ID: Heather Frederick, female   DOB: 1956/03/06, 59 y.o.   MRN: [id number]  [place] 05/28/2014 2:30 PM Location: [facility] Patient #: [id number] DOB: 1956/02/10 Married / Language: [name] / Race: White Female History of Present Illness Heather Hollingshead MD; 05/28/2014 3:12 PM) Patient words: gallbladder.  The patient is a 59 year old female    Note:She is referred by Dr. Fuller Plan because of upper abdominal pain and cholelithiasis. She's had typical gastroesophageal reflux disease symptoms for many years. Her symptoms have been well controlled with a proton pump inhibitor. Recently, she developed some epigastric abdominal pain/lower chest pain after eating some spicy food. She had previously had an ultrasound from 2014 demonstrating a normal gallbladder. She had a full cardiac workup which was unremarkable. A current ultrasound demonstrates multiple gallstones with gallbladder wall thickening. Common bile duct diameter is normal. She's also had slight worsening of her recent colic symptoms ever since she had the severe epigastric pain. She's been sent over here to discuss cholecystectomy. There is a strong family history of gallbladder disease.  Other Problems Marjean Donna, CMA; 05/28/2014 2:30 PM) Anxiety Disorder Arthritis Back Pain Bladder Problems Chest pain Cholelithiasis Depression Gastroesophageal Reflux Disease Hemorrhoids Hypercholesterolemia Migraine Headache Other disease, cancer, significant illness  Past Surgical History Marjean Donna, CMA; 05/28/2014 2:30 PM) Colon Polyp Removal - Colonoscopy Foot Surgery Bilateral. Hemorrhoidectomy Hysterectomy (not due to cancer) - Partial Knee Surgery Bilateral. Oral Surgery Shoulder Surgery Left.  Diagnostic Studies History Marjean Donna, CMA; 05/28/2014 2:30 PM) Colonoscopy 1-5 years ago Mammogram within last year Pap Smear 1-5 years ago  Allergies Marjean Donna, CMA; 05/28/2014  2:33 PM) Topamax *ANTICONVULSANTS* Advil *ANALGESICS - ANTI-INFLAMMATORY* Aleve *ANALGESICS - ANTI-INFLAMMATORY* Sulfabenzamide *CHEMICALS* Echinacea *ALTERNATIVE MEDICINES* Bee Pollen *NUTRIENTS*  Medication History Marjean Donna, CMA; 05/28/2014 2:36 PM) Lyrica (200MG  Capsule, Oral) Active. Citalopram Hydrobromide (20MG  Tablet, Oral) Active. Metoprolol Succinate ER (25MG  Tablet ER 24HR, Oral) Active. Pantoprazole Sodium (40MG  Tablet DR, Oral) Active. Simvastatin (20MG  Tablet, Oral) Active. Relpax (40MG  Tablet, Oral) Active. Nitrostat (0.3MG  Tab Sublingual, Sublingual) Active. Loratadine (10MG  Tablet, Oral) Active. Fish Oil Active. Vitamin D3 High Potency (1000UNIT Capsule, Oral) Active. Aspirin EC (81MG  Tablet DR, Oral) Active. Cranberry (125MG  Tablet, Oral) Active. Multivitamins (Oral) Active. Spironolactone (25MG  Tablet, Oral) Active. Methyl B-12 (1000MCG Lozenge, Oral) Active. Medications Reconciled  Social History Marjean Donna, CMA; 05/28/2014 2:30 PM) Alcohol use Occasional alcohol use. Caffeine use Tea. No drug use Tobacco use Never smoker.  Family History Marjean Donna, [place]; 05/28/2014 2:30 PM) Anesthetic complications Father. Arthritis Father, Mother. Breast Cancer Family Members In General. Cerebrovascular Accident Mother. Colon Cancer Family Members In General. Depression Father, Mother. Diabetes Mellitus Family Members In General, Father. Heart Disease Brother, Family Members In General, Father, Mother. Heart disease in female family member before age 79 Heart disease in female family member before age 1 Hypertension Brother, Mother. Kidney Disease Brother, Family Members In General. Melanoma Mother. Migraine Headache Brother, Daughter. Thyroid problems Mother.  Pregnancy / Birth History Marjean Donna, [place]; 05/28/2014 2:30 PM) Age at menarche 38 years. Age of menopause 26-50 Contraceptive History Oral  contraceptives. Gravida 2 Maternal age 48-25 Para 2 Regular periods     Review of Systems ([place]; 05/28/2014 2:30 PM) General Present- Weight Gain. Not Present- Appetite Loss, Chills, Fatigue, Fever, Night Sweats and Weight Loss. Skin Not Present- Change in Wart/Mole, Dryness, Hives, Jaundice, New Lesions, Non-Healing Wounds, Rash and Ulcer. HEENT Present- Hearing Loss, Oral Ulcers and Wears glasses/contact lenses. Not Present- Earache,  Hoarseness, Nose Bleed, Ringing in the Ears, Seasonal Allergies, Sinus Pain, Sore Throat, Visual Disturbances and Yellow Eyes. Respiratory Present- Chronic Cough. Not Present- Bloody sputum, Difficulty Breathing, Snoring and Wheezing. Breast Not Present- Breast Mass, Breast Pain, Nipple Discharge and Skin Changes. Cardiovascular Present- Chest Pain and Shortness of Breath. Not Present- Difficulty Breathing Lying Down, Leg Cramps, Palpitations, Rapid Heart Rate and Swelling of Extremities. Gastrointestinal Present- Abdominal Pain, Difficulty Swallowing, Excessive gas and Indigestion. Not Present- Bloating, Bloody Stool, Change in Bowel Habits, Chronic diarrhea, Constipation, Gets full quickly at meals, Hemorrhoids, Nausea, Rectal Pain and Vomiting. Female Genitourinary Present- Urgency. Not Present- Frequency, Nocturia, Painful Urination and Pelvic Pain. Musculoskeletal Present- Back Pain and Joint Pain. Not Present- Joint Stiffness, Muscle Pain, Muscle Weakness and Swelling of Extremities. Neurological Present- Headaches. Not Present- Decreased Memory, Fainting, Numbness, Seizures, Tingling, Tremor, Trouble walking and Weakness. Psychiatric Present- Depression. Not Present- Anxiety, Bipolar, Change in Sleep Pattern, Fearful and Frequent crying. Endocrine Not Present- Cold Intolerance, Excessive Hunger, Hair Changes, Heat Intolerance, Hot flashes and New Diabetes. Hematology Not Present- Easy Bruising, Excessive bleeding, Gland problems, HIV and  Persistent Infections.  Vitals (Sonya Bynum CMA; 05/28/2014 2:30 PM) 05/28/2014 2:30 PM Weight: 309 lb Height: 65in Body Surface Area: 2.54 m Body Mass Index: 51.42 kg/m Temp.: 97.3F(Temporal)  Pulse: 84 (Regular)  BP: 146/84 (Sitting, Left Arm, Standard)     Physical Exam Heather Hollingshead MD; 05/28/2014 3:13 PM)  The physical exam findings are as follows: Note:General: Obese female in NAD. Pleasant and cooperative.  HEENT: [place]/AT, no facial masses  EYES: EOMI, no icterus  CV: RRR, no murmur, no JVD.  CHEST: Breath sounds equal and clear. Respirations nonlabored.  BREASTS: Symmetrical in size. No dominant masses, nipple discharge or suspicious skin lesions.  ABDOMEN: Soft, obese, nontender, nondistended, no masses, no organomegaly  SKIN: No jaundice or suspicious rashes.  NEUROLOGIC: Alert and oriented, answers questions appropriately, normal gait and station.  PSYCHIATRIC: Normal mood, affect , and behavior.    Assessment & Plan Heather Hollingshead MD; 05/28/2014 3:14 PM)  SYMPTOMATIC CHOLELITHIASIS (574.20  K80.20) Impression: Her symptoms are different than her gastroesophageal reflux symptoms. She has had interval development of gallbladder wall thickening and gallstones since 2014. I told her I wasn't sure that her reflux symptoms would be any better following cholecystectomy.  Plan: We discussed laparoscopic cholecystectomy with cholangiogram and she is in agreement with this. I have explained the procedure, risks, and aftercare of cholecystectomy. Risks include but are not limited to bleeding, infection, wound problems, anesthesia, diarrhea, bile leak, injury to common bile duct/liver/intestine. She seems to understand and agrees to proceed.    Jackolyn Confer, MD

## 2014-06-11 NOTE — Discharge Instructions (Addendum)
[facility] LAPAR[facility]: POST OP INSTRUCTIONS Always review your discharge instruction sheet given to you by the facility where your surgery was performed. IF YOU HAVE DISABILITY OR FAMILY LEAVE FORMS, YOU MUST BRING THEM TO THE OFFICE FOR PROCESSING.   DO NOT GIVE THEM TO YOUR DOCTOR.  1. A prescription for pain medication may be given to you upon discharge.  Take your pain medication as prescribed, if needed.  If narcotic pain medicine is not needed, then you may take acetaminophen (Tylenol) or ibuprofen (Advil) as needed. 2. Take your usually prescribed medications unless otherwise directed. 3. If you need a refill on your pain medication, please contact your pharmacy.  They will contact our office to request authorization. Prescriptions will not be filled after 5pm or on week-ends. 4. You should follow a liquid diet the first two days after arrival home, such as soup and crackers, etc.  Be sure to include lots of fluids daily.  Advance to a solid, lowfat diet 2 days after your surgery.  Do not overeat. 5. Most patients will experience some swelling and bruising in the area of the incisions.  Ice packs will help.  Swelling and bruising can take several days to resolve.  6. It is common to experience some constipation if taking pain medication after surgery.  Increasing fluid intake and taking a stool softener (such as Colace) will usually help or prevent this problem from occurring.  A mild laxative (Milk of Magnesia or Miralax) should be taken according to package instructions if there are no bowel movements after 48 hours. 7. Unless discharge instructions indicate otherwise, you may remove your bandages 72 hours after surgery, and you may shower 1-2 days after your surgery when you are comfortable doing so.  You may have steri-strips (small skin tapes) in place directly over the incision.  These strips should be left on the skin.  If your surgeon used skin glue  on the incision, you may shower in 24 hours.  The glue will flake off over the next 2-3 weeks.  Any sutures or staples will be removed at the office during your follow-up visit. 8. ACTIVITIES:  You may resume regular (light) daily activities beginning the next day--such as daily self-care, walking, climbing stairs--gradually increasing activities as tolerated.  You may have sexual intercourse when it is comfortable.  Refrain from any heavy lifting or straining-nothing over 10 pounds for 2 weeks.  a. You may drive when you are no longer taking prescription pain medication, you can comfortably wear a seatbelt, and you can safely maneuver your car and apply brakes. b. RETURN TO WORK:  Desk work in one week, full duty in 2 weeks if pain-free.__________________________________________________________ 9. You should see your doctor in the office for a follow-up appointment approximately 2-3 weeks after your surgery.  Make sure that you call for this appointment within a day or two after you arrive home to insure a convenient appointment time. 10. OTHER INSTRUCTIONS: __Resume aspirin and fish oil 3 days after surgery.________________________________________________________________________________________________________________________ __________________________________________________________________________________________________________________________ WHEN TO CALL YOUR DOCTOR: 1. Fever over 101.0 2. Inability to urinate 3. Continued bleeding from incision. 4. Increased pain, redness, or drainage from the incision. 5. Increasing abdominal pain  The clinic staff is available to answer your questions during regular business hours.  Please dont hesitate to call and ask to speak to one of the nurses for clinical concerns.  If you have a medical emergency, go to the nearest emergency room or call 911.  A surgeon from [facility] is always on call at the hospital. [address] [place], [place], [place]  [address] ? [address], [place], [place]   [address] [phone] ? [phone] ? FAX [phone] Web site: [email or web address]

## 2014-06-11 NOTE — Progress Notes (Signed)
Spoke with Dr. Orene Desanctis re: giving pain meds after narcan in OR ; OK to given pain meds in small doses

## 2014-06-11 NOTE — Anesthesia Postprocedure Evaluation (Signed)
  Anesthesia Post-op Note  Patient: Heather Frederick  Procedure(s) Performed: Procedure(s): LAPAROSCOPIC CHOLECYSTECTOMY WITH ATTEMPTED INTRAOPERATIVE CHOLANGIOGRAM (N/A)  Patient Location: PACU  Anesthesia Type:General  Level of Consciousness: awake and alert   Airway and Oxygen Therapy: Patient Spontanous Breathing  Post-op Pain: mild  Post-op Assessment: Post-op Vital signs reviewed, Patient's Cardiovascular Status Stable and Respiratory Function Stable  Post-op Vital Signs: stable  Last Vitals:  Filed Vitals:   06/11/14 1456  BP: 124/70  Pulse: 87  Temp: 36.5 C  Resp: 16    Complications: No apparent anesthesia complications

## 2014-06-11 NOTE — Op Note (Signed)
Preoperative diagnosis:  Symptomatic cholelithiasis  Postoperative diagnosis:  same  Procedure: Laparoscopic cholecystectomy with cholangiogram.  Surgeon: Jackolyn Confer, M.D.  Asst.:  Gwenith Spitz, PA-S  Anesthesia: General  Indication:   This is a 59 year old morbidly obese female with upper abdominal pain and cholelithiasis. Common bile duct is normal in diameter. She has a minimal elevation of one of her transaminases. She now presents for elective cholecystectomy.  Technique: She was brought to the operating room, placed supine on the operating table, and a general anesthetic was administered. The abdominal wall was then sterilely prepped and draped. Local anesthetic (Marcaine) was infiltrated in the supraumbilical region. A small supraumbilical incision was made through the skin, subcutaneous tissue, fascia, and peritoneum entering the peritoneal cavity under direct vision. A pursestring suture of 0 Vicryl was placed around the edges of the fascia. A Hassan trocar was introduced into the peritoneal cavity and a pneumoperitoneum was created by insufflation of carbon dioxide gas. The laparoscope was introduced into the trocar and no underlying bleeding or organ injury was noted. She was then placed in the reverse Trendelenburg position with the right side tilted slightly up.  Three 5 mm trocars were then placed into the abdominal cavity under laparoscopic vision. One in the epigastric area, and 2 in the right upper quadrant area. The gallbladder was visualized.  There were multiple adhesions between the omentum and fundus, body, and infundibulum. These were mobilized free from the gallbladder. The gallbladder was very long. Her body habitus made the operation more difficult. An angled scope was used The fundus was grasped and retracted toward the right shoulder.  The infundibulum was mobilized with dissection close to the gallbladder and retracted laterally. The cystic duct was identified and a  window was created around it.  A clip was placed at the neck of the gallbladder. A small incision was made in the cystic duct. A cholangiocatheter was introduced through the anterior abdominal wall and placed in the cystic duct. A intraoperative cholangiogram was then performed.  Under real-time fluoroscopy, dilute contrast was injected into the cystic duct.  Contrast leaked out of the cystic duct. I inspected the area laparoscopically and noted that the catheter had passed directly through the cystic duct. Because of concerns of length.The cholangiogram was aborted.   I removed the cholangiocatheter and  the cystic duct was clipped 3 times on the biliary side and then it was divided.  No bile leak was noted from the cystic duct stump.  The cystic artery, identified, isolated and was then clipped and divided. Following this the gallbladder was dissected free from the liver using electrocautery. There was some bleeding from omental adhesions and these areas were clipped to help control the bleeding. The gallbladder was then placed in a retrieval bag and removed from the abdominal cavity through the subumbilical incision.  The gallbladder fossa was inspected, irrigated, and bleeding was controlled with electrocautery. Inspection showed that hemostasis was adequate and there was no evidence of bile leak.  The irrigation fluid was evacuated as much as possible.  The supraumbilical trocar was removed and the fascial defect was closed by tightening and tying down the pursestring suture under laparoscopic vision.  The remaining trocars were removed and the pneumoperitoneum was released. The skin incisions were closed with 4-0 Monocryl subcuticular stitches. Steri-Strips and sterile dressings were applied.  The procedure was well-tolerated without any apparent complications. She was taken to the recovery room in satisfactory condition.

## 2014-06-12 ENCOUNTER — Encounter (HOSPITAL_COMMUNITY): Payer: Self-pay | Admitting: General Surgery

## 2014-06-12 DIAGNOSIS — K8010 Calculus of gallbladder with chronic cholecystitis without obstruction: Secondary | ICD-10-CM | POA: Diagnosis not present

## 2014-06-12 NOTE — Progress Notes (Signed)
Not in a great deal of pain.  Wanted tylenol instead of percocet during the night.  Said she would ask for some today.

## 2014-06-12 NOTE — Progress Notes (Signed)
Discharge instructions given to pt with all questions answered.

## 2014-06-12 NOTE — Progress Notes (Signed)
1 Day Post-Op  Subjective: Pt is feeling fine today. Return of bowel function. Ambulating independently without issue. No complaints of DOE or dyspnea.  Pain well controlled, she requests to trial non-narcotic pain control. She endorses using incentive spirometry. No N/V and tolerating clears. Inquires about D/C. Objective: Vital signs in last 24 hours: Temp:  [97.6 F (36.4 C)-98.8 F (37.1 C)] 98.4 F (36.9 C) (03/18 0539) Pulse Rate:  [67-110] 110 (03/18 0539) Resp:  [14-20] 18 (03/18 0539) BP: (105-159)/(51-108) 110/64 mmHg (03/18 0539) SpO2:  [86 %-99 %] 96 % (03/18 0539) Weight:  [298 lb (135.172 kg)] 298 lb (135.172 kg) (03/17 0948)    Intake/Output from previous day: 03/17 0701 - 03/18 0700 In: 2028.8 [I.V.:2028.8] Out: 380 [Urine:350; Blood:30] Intake/Output this shift:    PE: General- In NAD Cardiovascular - RRR Pulm - CTA b/l with diminished bases. No wheeze or rales. Abdomen- SNT incision clean/dry/intact/dressed.  Lab Results:   Recent Labs  06/09/14 0850  WBC 5.5  HGB 14.6  HCT 43.8  PLT 162   BMET  Recent Labs  06/09/14 0850  NA 141  K 4.2  CL 104  CO2 28  GLUCOSE 108*  BUN 12  CREATININE 0.87  CALCIUM 9.2   PT/INR  Recent Labs  06/09/14 0850  LABPROT 14.3  INR 1.10   Comprehensive Metabolic Panel:    Component Value Date/Time   NA 141 06/09/2014 0850   NA 140 03/09/2014 1617   K 4.2 06/09/2014 0850   K 4.1 03/09/2014 1617   CL 104 06/09/2014 0850   CL 106 03/09/2014 1617   CO2 28 06/09/2014 0850   CO2 30 03/09/2014 1617   BUN 12 06/09/2014 0850   BUN 13 03/09/2014 1617   CREATININE 0.87 06/09/2014 0850   CREATININE 0.9 03/09/2014 1617   GLUCOSE 108* 06/09/2014 0850   GLUCOSE 90 03/09/2014 1617   CALCIUM 9.2 06/09/2014 0850   CALCIUM 9.3 03/09/2014 1617   AST 27 06/09/2014 0850   AST 25 03/09/2014 1617   ALT 40* 06/09/2014 0850   ALT 38* 03/09/2014 1617   ALKPHOS 63 06/09/2014 0850   ALKPHOS 60 03/09/2014 1617   BILITOT 0.8 06/09/2014 0850   BILITOT 0.5 03/09/2014 1617   PROT 6.8 06/09/2014 0850   PROT 6.8 03/09/2014 1617   ALBUMIN 3.9 06/09/2014 0850   ALBUMIN 3.9 03/09/2014 1617     Studies/Results: No results found.  Anti-infectives: Anti-infectives    Start     Dose/Rate Route Frequency Ordered Stop   06/11/14 0600  ceFAZolin (ANCEF) 3 g in dextrose 5 % 50 mL IVPB     3 g 160 mL/hr over 30 Minutes Intravenous On call to O.R. 06/10/14 1907 06/11/14 1116      Assessment Active Problems:   Symptomatic cholelithiasis - Laparoscopic Cholecystectomy ([place] 06/11/2014)   Transient Post-Operative Hypoxia - May be due to undiagnosed sleep apnea (pt scheduled for sleep study 06/30/14)      Plan:  SpO2 consistently in mid-90s overnight with 2L via [place]. D/C O2 this morning with no change in SpO2. Encouraged to continue to use incentive spirometer and ambulate as tolerated. Anticipate D/C today.   Gwenith Spitz PA-S 06/12/2014

## 2014-06-24 ENCOUNTER — Encounter: Payer: Self-pay | Admitting: Neurology

## 2014-06-24 ENCOUNTER — Ambulatory Visit (INDEPENDENT_AMBULATORY_CARE_PROVIDER_SITE_OTHER): Payer: 59 | Admitting: Neurology

## 2014-06-24 VITALS — BP 115/62 | HR 65 | Ht 64.0 in | Wt 297.4 lb

## 2014-06-24 DIAGNOSIS — G5711 Meralgia paresthetica, right lower limb: Secondary | ICD-10-CM | POA: Diagnosis not present

## 2014-06-24 DIAGNOSIS — G43009 Migraine without aura, not intractable, without status migrainosus: Secondary | ICD-10-CM | POA: Diagnosis not present

## 2014-06-24 MED ORDER — PREGABALIN 200 MG PO CAPS: ORAL_CAPSULE | ORAL | Status: DC

## 2014-06-24 MED ORDER — ELETRIPTAN HYDROBROMIDE 40 MG PO TABS: 40.0000 mg | ORAL_TABLET | ORAL | Status: DC | PRN

## 2014-06-24 NOTE — Patient Instructions (Signed)

## 2014-06-24 NOTE — Progress Notes (Signed)
Reason for visit: Migraine headache  Heather Frederick is an 59 y.o. female  History of present illness:  Heather Frederick is a 59 year old right-handed white female with a history of morbid obesity and a history of migraine headache. The patient has done well with her migraine since she retired from her job. The patient is having approximately one headache a month at this time. The patient was on propranolol, but she was taken off of this and put on Toprol, which she is currently taking. The patient is on Relpax if needed for the headache. The patient indicates that if she can get to the headache quickly, the headache will only last about 15-20 minutes with the Relpax. She recently had gallbladder surgery. Otherwise, she is doing well. She returns for an evaluation.  Past Medical History  Diagnosis Date  . Anxiety   . Mental disorder   . GERD (gastroesophageal reflux disease)   . Arthritis   . Hard of hearing   . Migraine headache   . Obesity   . Depression   . Lateral epicondylitis of right elbow   . Dyslipidemia   . History of frequent urinary tract infections   . IBS (irritable bowel syndrome)   . Osteoarthritis   . Gallstones     a. Seen on CT 01/2014.  Marland Kitchen Dyspnea     a. Echo 10/2013: mild LVH, EF 55-60%, mildly dilated LA. b. pBNP normal 12/2013. c. Nuc 12/2013: low risk with anterior breast attenuation and possible small area of inferoapical ischemia. Technically limited. EF 61%. d. CTA 01/2014: no PE. Mosiac perfusion of lungs may indiate small airways disease or perfusion abnormalities. Question small gallstones within gb."  . Urinary tract infection   . Family history of anesthesia complication     father has a severe hard time waking up  . Hypertension   . Raynaud disease     in feet per patient   . Meralgia paresthetica of right side 10/02/2012    slight at 05/2014    Past Surgical History  Procedure Laterality Date  . Joint replacement    . Knee arthroscopy Left 12/12  .  Carpel tunnel rel Right 4/12  . Rotator cuff repair Left 6/11  . Plantar fascia release Right 12/10  . Bladder suspension  6/10  . Carpel tunnel left/right  7/08, 8/08 Bilateral 8/08 and 7/08  . Knee arthroscopy Right 12/06  . Tennis elbow release Right 7/04  . Abdominal hysterectomy  1/04    partial  . Total knee arthroplasty Left 09/10/2012    Procedure: TOTAL KNEE ARTHROPLASTY- left;  Surgeon: Garald Balding, MD;  Location: [facility];  Service: Orthopedics;  Laterality: Left;  Left total knee arthroplasty  . Left heart catheterization with coronary angiogram N/A 03/10/2014    Procedure: LEFT HEART CATHETERIZATION WITH CORONARY ANGIOGRAM;  Surgeon: Sinclair Grooms, MD;  Location: [facility] CATH LAB;  Service: Cardiovascular;  Laterality: N/A;  . Cardiac catheterization    . Colonoscopy    . Radial tunnel release      right arm   . Cholecystectomy N/A 06/11/2014    Procedure: LAPAROSCOPIC CHOLECYSTECTOMY WITH ATTEMPTED INTRAOPERATIVE CHOLANGIOGRAM;  Surgeon: Jackolyn Confer, MD;  Location: [facility];  Service: General;  Laterality: N/A;    Family History  Problem Relation Age of Onset  . Hypertension Mother   . Stroke Mother   . Liver disease Mother     Abcess  . Hypertension Father   . Coronary artery disease  Father   . Migraines Father   . Clotting disorder Father   . Kidney failure Brother   . Hypertension Brother   . Migraines Brother   . Breast cancer Other     Niece with breast cancer  . Migraines Daughter   . Colon cancer Paternal Grandmother   . Pancreatic cancer Paternal Grandmother   . Stomach cancer Paternal Grandmother   . Colon polyps Neg Hx   . Esophageal cancer Neg Hx   . Breast cancer Cousin     Social history:  reports that she has never smoked. She has never used smokeless tobacco. She reports that she drinks alcohol. She reports that she does not use illicit drugs.    Allergies  Allergen Reactions  . Topamax [Topiramate] Other (See Comments)    :Stroke like  symptoms  . Advil [Ibuprofen] Hives    Has tolerated Voltaren topical as well as aspirin.  Tori Milks [Naproxen Sodium] Hives    Has tolerated Voltaren topical as well as aspirin.  . Bee Venom Swelling  . Echinacea Hives  . Other Other (See Comments)    Feathers cause sinus congestion  . Sulfa Antibiotics Hives    Medications:  Prior to Admission medications   Medication Sig Start Date End Date Taking? Authorizing Provider  acetaminophen (TYLENOL) 500 MG tablet Take 1,000 mg by mouth 2 (two) times daily as needed for pain.   Yes Historical Provider, MD  aspirin 81 MG tablet Take 81 mg by mouth daily.   Yes Historical Provider, MD  Cholecalciferol (VITAMIN D-3) 1000 UNITS CAPS Take 2,000 Units by mouth daily.    Yes Historical Provider, MD  citalopram (CELEXA) 20 MG tablet Take 20 mg by mouth daily.    Yes Historical Provider, MD  CRANBERRY-VITAMIN C PO Take 4,200 mg by mouth daily.   Yes Historical Provider, MD  diclofenac sodium (VOLTAREN) 1 % GEL Apply 1 application topically 2 (two) times daily as needed (Pain in hip and left elbow).   Yes Historical Provider, MD  eletriptan (RELPAX) 40 MG tablet Take 1 tablet (40 mg total) by mouth every 2 (two) hours as needed for migraine. 10/28/13  Yes Kathrynn Ducking, MD  loratadine (CLARITIN) 10 MG tablet Take 10 mg by mouth 4 (four) times daily as needed for allergies.    Yes Historical Provider, MD  Lutein-Zeaxanthin 25-5 MG CAPS Take 1 capsule by mouth daily.   Yes Historical Provider, MD  LYRICA 200 MG capsule TAKE ONE (1) CAPSULE BY MOUTH EACH EVENING Patient taking differently: TAKE ONE (1) CAPSULE BY MOUTH EACH MORNING 03/26/14  Yes Kathrynn Ducking, MD  Methylcobalamin (METHYL B-12 PO) Take 2,500 mcg by mouth daily.   Yes Historical Provider, MD  metoprolol succinate (TOPROL-XL) 25 MG 24 hr tablet Take 1/2 tablet by mouth once daily for one week then increase to one tablet by mouth once daily Patient taking differently: Take 25 mg by mouth  daily.  05/07/14  Yes Belva Crome, MD  Multiple Vitamin (MULTIVITAMIN WITH MINERALS) TABS Take 1 tablet by mouth daily.   Yes Historical Provider, MD  nitroGLYCERIN (NITROSTAT) 0.4 MG SL tablet Place 1 tablet (0.4 mg total) under the tongue every 5 (five) minutes as needed. Patient taking differently: Place 0.4 mg under the tongue every 5 (five) minutes as needed for chest pain.  03/09/14  Yes Dayna N Dunn, PA-C  nystatin cream (MYCOSTATIN) Apply 1 application topically daily as needed (for itching).   Yes Historical Provider, MD  Omega-3 Fatty Acids (FISH OIL PO) Take 1,200 mg by mouth daily.   Yes Historical Provider, MD  oxybutynin (DITROPAN-XL) 10 MG 24 hr tablet Take 10 mg by mouth daily. 05/15/13  Yes Historical Provider, MD  oxyCODONE (OXY IR/ROXICODONE) 5 MG immediate release tablet Take 1-2 tablets (5-10 mg total) by mouth every 4 (four) hours as needed for moderate pain, severe pain or breakthrough pain. 06/11/14  Yes Jackolyn Confer, MD  pantoprazole (PROTONIX) 40 MG tablet Take 1 tablet (40 mg total) by mouth 2 (two) times daily. 05/08/14  Yes Ladene Artist, MD  simvastatin (ZOCOR) 20 MG tablet Take 20 mg by mouth daily.    Yes Historical Provider, MD  spironolactone (ALDACTONE) 25 MG tablet Take 25 mg by mouth daily.   Yes Historical Provider, MD    ROS:  Out of a complete 14 system review of symptoms, the patient complains only of the following symptoms, and all other reviewed systems are negative.  Migraine headache  Blood pressure 115/62, pulse 65, height 5\' 4"  (1.626 m), weight 297 lb 6.4 oz (134.9 kg).  Physical Exam  General: The patient is alert and cooperative at the time of the examination. The patient is markedly obese.  Skin: No significant peripheral edema is noted.   Neurologic Exam  Mental status: The patient is alert and oriented x 3 at the time of the examination. The patient has apparent normal recent and remote memory, with an apparently normal attention  span and concentration ability.   Cranial nerves: Facial symmetry is present. Speech is normal, no aphasia or dysarthria is noted. Extraocular movements are full. Visual fields are full.  Motor: The patient has good strength in all 4 extremities.  Sensory examination: Soft touch sensation is symmetric on the face, arms, and legs.  Coordination: The patient has good finger-nose-finger and heel-to-shin bilaterally.  Gait and station: The patient has a normal gait. Tandem gait is normal. Romberg is negative. No drift is seen.  Reflexes: Deep tendon reflexes are symmetric.   Assessment/Plan:  1. Migraine headache  2. Morbid obesity  The patient is doing well with her migraines currently, we will continue the Relpax, and she is on Lyrica 200 mg daily. The propranolol substituted for Toprol, which also may help headache. She will follow-up in about 9 months or sooner if needed. Prescriptions were given for the Relpax and Lyrica.  Jill Alexanders MD 06/24/2014 8:04 PM  [facility] [address] [place], [place] [address]  Phone [phone] Fax [phone]

## 2014-06-30 ENCOUNTER — Ambulatory Visit (HOSPITAL_BASED_OUTPATIENT_CLINIC_OR_DEPARTMENT_OTHER): Payer: 59 | Attending: Cardiology | Admitting: Radiology

## 2014-06-30 ENCOUNTER — Encounter: Payer: Self-pay | Admitting: Internal Medicine

## 2014-06-30 VITALS — Ht 64.0 in | Wt 297.0 lb

## 2014-06-30 DIAGNOSIS — R0683 Snoring: Secondary | ICD-10-CM | POA: Diagnosis not present

## 2014-06-30 DIAGNOSIS — Z6841 Body Mass Index (BMI) 40.0 and over, adult: Secondary | ICD-10-CM | POA: Diagnosis not present

## 2014-06-30 DIAGNOSIS — G4733 Obstructive sleep apnea (adult) (pediatric): Secondary | ICD-10-CM | POA: Diagnosis not present

## 2014-07-01 ENCOUNTER — Telehealth: Payer: Self-pay | Admitting: Gastroenterology

## 2014-07-02 NOTE — Telephone Encounter (Signed)
Left message for patient to call back  

## 2014-07-02 NOTE — Telephone Encounter (Signed)
Patient is having continued dysphagia She is scheduled for follow up on 08/07/14

## 2014-07-04 ENCOUNTER — Telehealth: Payer: Self-pay | Admitting: Cardiology

## 2014-07-04 DIAGNOSIS — G4733 Obstructive sleep apnea (adult) (pediatric): Secondary | ICD-10-CM | POA: Insufficient documentation

## 2014-07-04 NOTE — Telephone Encounter (Signed)
Please let patient know that she has severe OSA and had successful CPAP titration to 18cm H2O.  Please let [facility] know that orders are in EPIC and set up OV with me in 10 weeks

## 2014-07-04 NOTE — Addendum Note (Signed)
Addended by: Fransico Him R on: 07/04/2014 01:09 PM   Modules accepted: Orders

## 2014-07-04 NOTE — Sleep Study (Signed)
NAME: Heather Frederick DATE OF BIRTH:  1955-10-31 MEDICAL RECORD NUMBER [id number]  LOCATION: [facility]  PHYSICIAN: Altin Sease R  DATE OF STUDY: 06/30/2014  SLEEP STUDY TYPE: Split night Nocturnal Polysomnogram with CPAP titration               REFERRING PHYSICIAN: Sueanne Margarita, MD  INDICATION FOR STUDY: snoring and obestiy  EPWORTH SLEEPINESS SCORE: 8 HEIGHT: 5\' 4"  (162.6 cm)  WEIGHT: 297 lb (134.718 kg)    Body mass index is 50.95 kg/(m^2).  NECK SIZE: 16.5 in.  MEDICATIONS: Reviewed in the chart  SLEEP ARCHITECTURE: During the diagnostic portion of the study, the patient slept for a total of 123 minutes out of a total sleep time of 138 miutes with no slow wave or REM sleep.  The onset to sleep latency was normal at 11 minutes.  The sleep efficiency was mildly reduced at 82%.  During the CPAP titration, the total sleep time was 187 minutes out of a total sleep period of 212 minutes.  There was no slow wave sleep and 33 minutes of REM sleep.  During the diagnostic portion of the study, there was an increased frequency of arousals with an arousal index of 19.5 per hour due to respiratory and spontaneous events.    RESPIRATORY DATA: During the diagnostic portion of the study, there were 30 obstructive apneas and 77 hypopneas.  All events occurred during nonsupine, REM sleep.  The AHI was 37 .6 events per hour consistent with severe obstructive sleep apnea/hypopnea syndrome.  There was moderate to severe snoring.  The patient was started on CPAP at 5cm H2O and titrated for respiratory events and snoring to 18cm H2O.  The patient was able to achieve REM sleep but unable to maintain the supine position at an optimum pressure of 18cm H2O without further respiratory events.  The AHI was 2 events per hour at 18cm H2O.  Snoring was eliminated with CPAP therapy.    OXYGEN DATA: During the diagnostic portion of the study, the average oxygen saturation was 93%.  The lowest  oxygen saturation was 85%.  During the CPAP titration, the average oxygen saturation was 94% and the lowest oxygen saturation was 83%.  The total time spent with oxygen saturations lower than 88% was 1.1 minutes.  CARDIAC DATA: The patient maintained NSR with PAC's.  The average heart rate was 65 bpm. The lowest heart rate was 47 bpm and the highest heart rate was 96 bpm.    MOVEMENT/PARASOMNIA: There were no periodic limb movements or REM sleep behavior disorders noted during sleep.  IMPRESSION/ RECOMMENDATION:   1.  Severe obstructive sleep apnea/hypopnea syndrome with an AHI of 37.6 events per hour.    All events occurred during nonsupine, REM sleep. 2.  Reduced sleep efficiency with increased frequency of arousals due to respiratory events. 3.  Abnormal sleep architecture with no REM or slow wave sleep during the diagnostic portion. 4.  NSR with PAC's were noted during the study. 5.  Oxygen desaturations were noted during respiratory events. 6.  Successful CPAP titration to 18cm H2O.   7.  Moderate to severe snoring that was eliminated with CPAP therapy. 8.  Recommend ResMed CPAP with heated humidifier with small [employer] Simplus Full face mask 9.  Morbid obesity 10.  The patient should be counseled in good sleep hygiene and weight loss management.  Signed: Sueanne Margarita Diplomate, American Board of Sleep Medicine  ELECTRONICALLY SIGNED ON:  07/04/2014, 12:32  [facility] PH: [phone]   FX: [phone] ACCREDITED BY THE AMERICAN ACADEMY OF SLEEP MEDICINE

## 2014-07-10 NOTE — Telephone Encounter (Signed)
Pt. Is aware of results and a message has been sent to [facility]. Pt asked that I let [facility] know that she will be out of town until the 25th, I included that in my message to them

## 2014-07-28 ENCOUNTER — Encounter: Payer: Self-pay | Admitting: Internal Medicine

## 2014-07-28 ENCOUNTER — Ambulatory Visit (INDEPENDENT_AMBULATORY_CARE_PROVIDER_SITE_OTHER): Payer: 59 | Admitting: Internal Medicine

## 2014-07-28 VITALS — BP 110/68 | HR 70 | Temp 98.4°F | Resp 18 | Ht 65.0 in | Wt 297.1 lb

## 2014-07-28 DIAGNOSIS — Z23 Encounter for immunization: Secondary | ICD-10-CM | POA: Diagnosis not present

## 2014-07-28 DIAGNOSIS — Z0000 Encounter for general adult medical examination without abnormal findings: Secondary | ICD-10-CM

## 2014-07-28 MED ORDER — CITALOPRAM HYDROBROMIDE 20 MG PO TABS
20.0000 mg | ORAL_TABLET | Freq: Every day | ORAL | Status: DC
Start: 2014-07-28 — End: 2015-05-24

## 2014-07-28 MED ORDER — MAGIC MOUTHWASH W/LIDOCAINE: 5.0000 mL | Freq: Three times a day (TID) | ORAL | Status: DC | PRN

## 2014-07-28 MED ORDER — SIMVASTATIN 20 MG PO TABS: 20.0000 mg | ORAL_TABLET | Freq: Every day | ORAL | Status: DC

## 2014-07-28 NOTE — Progress Notes (Signed)
Pre visit review using our clinic review tool, if applicable. No additional management support is needed unless otherwise documented below in the visit note. 

## 2014-07-28 NOTE — Patient Instructions (Signed)
We will not check any blood work today. We have given you the tetanus shot to cover you for the whooping cough. Vaccinating adults helps to prevent children from getting this disease.   We have refilled your medicines and would like to see you back in about 6 months so we can check in on the weight. Hopefully after the foot surgery it will be easier for you to work on getting active.   Health Maintenance Adopting a healthy lifestyle and getting preventive care can go a long way to promote health and wellness. Talk with your health care provider about what schedule of regular examinations is right for you. This is a good chance for you to check in with your provider about disease prevention and staying healthy. In between checkups, there are plenty of things you can do on your own. Experts have done a lot of research about which lifestyle changes and preventive measures are most likely to keep you healthy. Ask your health care provider for more information. WEIGHT AND DIET  Eat a healthy diet  Be sure to include plenty of vegetables, fruits, low-fat dairy products, and lean protein.  Do not eat a lot of foods high in solid fats, added sugars, or salt.  Get regular exercise. This is one of the most important things you can do for your health.  Most adults should exercise for at least 150 minutes each week. The exercise should increase your heart rate and make you sweat (moderate-intensity exercise).  Most adults should also do strengthening exercises at least twice a week. This is in addition to the moderate-intensity exercise.  Maintain a healthy weight  Body mass index (BMI) is a measurement that can be used to identify possible weight problems. It estimates body fat based on height and weight. Your health care provider can help determine your BMI and help you achieve or maintain a healthy weight.  For females 35 years of age and older:   A BMI below 18.5 is considered underweight.  A  BMI of 18.5 to 24.9 is normal.  A BMI of 25 to 29.9 is considered overweight.  A BMI of 30 and above is considered obese.  Watch levels of cholesterol and blood lipids  You should start having your blood tested for lipids and cholesterol at 57 years of age, then have this test every 5 years.  You may need to have your cholesterol levels checked more often if:  Your lipid or cholesterol levels are high.  You are older than 57 years of age.  You are at high risk for heart disease.  CANCER SCREENING   Lung Cancer  Lung cancer screening is recommended for adults 68-69 years old who are at high risk for lung cancer because of a history of smoking.  A yearly low-dose CT scan of the lungs is recommended for people who:  Currently smoke.  Have quit within the past 15 years.  Have at least a 30-pack-year history of smoking. A pack year is smoking an average of one pack of cigarettes a day for 1 year.  Yearly screening should continue until it has been 15 years since you quit.  Yearly screening should stop if you develop a health problem that would prevent you from having lung cancer treatment.  Breast Cancer  Practice breast self-awareness. This means understanding how your breasts normally appear and feel.  It also means doing regular breast self-exams. Let your health care provider know about any changes, no matter how small.  If you are in your 20s or 30s, you should have a clinical breast exam (CBE) by a health care provider every 1-3 years as part of a regular health exam.  If you are 22 or older, have a CBE every year. Also consider having a breast X-ray (mammogram) every year.  If you have a family history of breast cancer, talk to your health care provider about genetic screening.  If you are at high risk for breast cancer, talk to your health care provider about having an MRI and a mammogram every year.  Breast cancer gene (BRCA) assessment is recommended for women  who have family members with BRCA-related cancers. BRCA-related cancers include:  Breast.  Ovarian.  Tubal.  Peritoneal cancers.  Results of the assessment will determine the need for genetic counseling and BRCA1 and BRCA2 testing. Cervical Cancer Routine pelvic examinations to screen for cervical cancer are no longer recommended for nonpregnant women who are considered low risk for cancer of the pelvic organs (ovaries, uterus, and vagina) and who do not have symptoms. A pelvic examination may be necessary if you have symptoms including those associated with pelvic infections. Ask your health care provider if a screening pelvic exam is right for you.   The Pap test is the screening test for cervical cancer for women who are considered at risk.  If you had a hysterectomy for a problem that was not cancer or a condition that could lead to cancer, then you no longer need Pap tests.  If you are older than 65 years, and you have had normal Pap tests for the past 10 years, you no longer need to have Pap tests.  If you have had past treatment for cervical cancer or a condition that could lead to cancer, you need Pap tests and screening for cancer for at least 20 years after your treatment.  If you no longer get a Pap test, assess your risk factors if they change (such as having a new sexual partner). This can affect whether you should start being screened again.  Some women have medical problems that increase their chance of getting cervical cancer. If this is the case for you, your health care provider may recommend more frequent screening and Pap tests.  The human papillomavirus (HPV) test is another test that may be used for cervical cancer screening. The HPV test looks for the virus that can cause cell changes in the cervix. The cells collected during the Pap test can be tested for HPV.  The HPV test can be used to screen women 86 years of age and older. Getting tested for HPV can extend the  interval between normal Pap tests from three to five years.  An HPV test also should be used to screen women of any age who have unclear Pap test results.  After 57 years of age, women should have HPV testing as often as Pap tests.  Colorectal Cancer  This type of cancer can be detected and often prevented.  Routine colorectal cancer screening usually begins at 57 years of age and continues through 57 years of age.  Your health care provider may recommend screening at an earlier age if you have risk factors for colon cancer.  Your health care provider may also recommend using home test kits to check for hidden blood in the stool.  A small camera at the end of a tube can be used to examine your colon directly (sigmoidoscopy or colonoscopy). This is done to check for  the earliest forms of colorectal cancer.  Routine screening usually begins at age 60.  Direct examination of the colon should be repeated every 5-10 years through 57 years of age. However, you may need to be screened more often if early forms of precancerous polyps or small growths are found. Skin Cancer  Check your skin from head to toe regularly.  Tell your health care provider about any new moles or changes in moles, especially if there is a change in a mole's shape or color.  Also tell your health care provider if you have a mole that is larger than the size of a pencil eraser.  Always use sunscreen. Apply sunscreen liberally and repeatedly throughout the day.  Protect yourself by wearing long sleeves, pants, a wide-brimmed hat, and sunglasses whenever you are outside. HEART DISEASE, DIABETES, AND HIGH BLOOD PRESSURE   Have your blood pressure checked at least every 1-2 years. High blood pressure causes heart disease and increases the risk of stroke.  If you are between 57 years and 26 years old, ask your health care provider if you should take aspirin to prevent strokes.  Have regular diabetes screenings. This  involves taking a blood sample to check your fasting blood sugar level.  If you are at a normal weight and have a low risk for diabetes, have this test once every three years after 57 years of age.  If you are overweight and have a high risk for diabetes, consider being tested at a younger age or more often. PREVENTING INFECTION  Hepatitis B  If you have a higher risk for hepatitis B, you should be screened for this virus. You are considered at high risk for hepatitis B if:  You were born in a country where hepatitis B is common. Ask your health care provider which countries are considered high risk.  Your parents were born in a high-risk country, and you have not been immunized against hepatitis B (hepatitis B vaccine).  You have HIV or AIDS.  You use needles to inject street drugs.  You live with someone who has hepatitis B.  You have had sex with someone who has hepatitis B.  You get hemodialysis treatment.  You take certain medicines for conditions, including cancer, organ transplantation, and autoimmune conditions. Hepatitis C  Blood testing is recommended for:  Everyone born from [birth year] through 1963.  Anyone with known risk factors for hepatitis C. Sexually transmitted infections (STIs)  You should be screened for sexually transmitted infections (STIs) including gonorrhea and chlamydia if:  You are sexually active and are younger than 57 years of age.  You are older than 57 years of age and your health care provider tells you that you are at risk for this type of infection.  Your sexual activity has changed since you were last screened and you are at an increased risk for chlamydia or gonorrhea. Ask your health care provider if you are at risk.  If you do not have HIV, but are at risk, it may be recommended that you take a prescription medicine daily to prevent HIV infection. This is called pre-exposure prophylaxis (PrEP). You are considered at risk if:  You are  sexually active and do not regularly use condoms or know the HIV status of your partner(s).  You take drugs by injection.  You are sexually active with a partner who has HIV. Talk with your health care provider about whether you are at high risk of being infected with HIV. If you  choose to begin PrEP, you should first be tested for HIV. You should then be tested every 3 months for as long as you are taking PrEP.  PREGNANCY   If you are premenopausal and you may become pregnant, ask your health care provider about preconception counseling.  If you may become pregnant, take 400 to 800 micrograms (mcg) of folic acid every day.  If you want to prevent pregnancy, talk to your health care provider about birth control (contraception). OSTEOPOROSIS AND MENOPAUSE   Osteoporosis is a disease in which the bones lose minerals and strength with aging. This can result in serious bone fractures. Your risk for osteoporosis can be identified using a bone density scan.  If you are 43 years of age or older, or if you are at risk for osteoporosis and fractures, ask your health care provider if you should be screened.  Ask your health care provider whether you should take a calcium or vitamin D supplement to lower your risk for osteoporosis.  Menopause may have certain physical symptoms and risks.  Hormone replacement therapy may reduce some of these symptoms and risks. Talk to your health care provider about whether hormone replacement therapy is right for you.  HOME CARE INSTRUCTIONS   Schedule regular health, dental, and eye exams.  Stay current with your immunizations.   Do not use any tobacco products including cigarettes, chewing tobacco, or electronic cigarettes.  If you are pregnant, do not drink alcohol.  If you are breastfeeding, limit how much and how often you drink alcohol.  Limit alcohol intake to no more than 1 drink per day for nonpregnant women. One drink equals 12 ounces of beer, 5  ounces of wine, or 1 ounces of hard liquor.  Do not use street drugs.  Do not share needles.  Ask your health care provider for help if you need support or information about quitting drugs.  Tell your health care provider if you often feel depressed.  Tell your health care provider if you have ever been abused or do not feel safe at home. Document Released: 09/26/2010 Document Revised: 07/28/2013 Document Reviewed: 02/12/2013 [facility] Patient Information 2015 [place], [place]. This information is not intended to replace advice given to you by your health care provider. Make sure you discuss any questions you have with your health care provider.

## 2014-07-28 NOTE — Progress Notes (Signed)
   Subjective:    Patient ID: Heather Frederick, female    DOB: [birth year], 59 y.o.   MRN: [id number]  HPI The patient is a 59 YO female who is coming in for wellness. She has no new complaints today. She is still having some urgency with her bowels since her cholecystectomy. No diarrhea. No weight loss.   PMH, Scnetx, social history reviewed and updated with the patient.   Review of Systems  Constitutional: Negative for fever, chills, activity change, appetite change, fatigue and unexpected weight change.  HENT: Negative.   Eyes: Negative.   Respiratory: Negative for cough, chest tightness, shortness of breath and wheezing.   Cardiovascular: Negative for chest pain, palpitations and leg swelling.  Gastrointestinal: Negative for abdominal pain, diarrhea, constipation and abdominal distention.       Urgency  Musculoskeletal: Negative.   Skin: Negative.   Neurological: Negative.   Psychiatric/Behavioral: Negative.       Objective:   Physical Exam  Constitutional: She appears well-developed and well-nourished.  Overweight  HENT:  Head: Normocephalic and atraumatic.  Right Ear: External ear normal.  Left Ear: External ear normal.  Eyes: EOM are normal.  Neck: Normal range of motion.  Cardiovascular: Normal rate and regular rhythm.   Pulmonary/Chest: Effort normal and breath sounds normal.  Abdominal: Soft. Bowel sounds are normal.  Skin: Skin is warm and dry.   Filed Vitals:   07/28/14 0909  BP: 110/68  Pulse: 70  Temp: 98.4 F (36.9 C)  TempSrc: Oral  Resp: 18  Height: 5\' 5"  (1.651 m)  Weight: 297 lb 1.9 oz (134.773 kg)  SpO2: 95%      Assessment & Plan:  Tdap given at visit.

## 2014-08-01 ENCOUNTER — Encounter: Payer: Self-pay | Admitting: Internal Medicine

## 2014-08-01 DIAGNOSIS — Z0000 Encounter for general adult medical examination without abnormal findings: Secondary | ICD-10-CM | POA: Insufficient documentation

## 2014-08-01 NOTE — Assessment & Plan Note (Signed)
Talked to her about how her weight is the most significant factor in her health right now and can be the cause of significant problems. No signs of diabetes but complicated by OSA and GERD.

## 2014-08-01 NOTE — Assessment & Plan Note (Signed)
Tdap given at visit, colonoscopy due in 2023, not a candidate for pap smear due to hysterectomy for benign etiology. Talked to her about sun protection and her weight. Her weight is her biggest health concern at this time. She is working on weight loss but not exercising so we discussed that today.

## 2014-08-05 ENCOUNTER — Ambulatory Visit (INDEPENDENT_AMBULATORY_CARE_PROVIDER_SITE_OTHER): Payer: 59 | Admitting: Interventional Cardiology

## 2014-08-05 ENCOUNTER — Encounter: Payer: Self-pay | Admitting: Interventional Cardiology

## 2014-08-05 ENCOUNTER — Other Ambulatory Visit (INDEPENDENT_AMBULATORY_CARE_PROVIDER_SITE_OTHER): Payer: 59

## 2014-08-05 DIAGNOSIS — I5030 Unspecified diastolic (congestive) heart failure: Secondary | ICD-10-CM

## 2014-08-05 DIAGNOSIS — I519 Heart disease, unspecified: Secondary | ICD-10-CM | POA: Diagnosis not present

## 2014-08-05 DIAGNOSIS — G4733 Obstructive sleep apnea (adult) (pediatric): Secondary | ICD-10-CM

## 2014-08-05 DIAGNOSIS — E785 Hyperlipidemia, unspecified: Secondary | ICD-10-CM

## 2014-08-05 LAB — BASIC METABOLIC PANEL
BUN: 15 mg/dL (ref 6–23)
CALCIUM: 9.7 mg/dL (ref 8.4–10.5)
CO2: 30 meq/L (ref 19–32)
Chloride: 102 mEq/L (ref 96–112)
Creatinine, Ser: 0.88 mg/dL (ref 0.40–1.20)
GFR: 70.44 mL/min (ref >60.00)
Glucose, Bld: 104 mg/dL — ABNORMAL HIGH (ref 70–99)
Potassium: 4 mEq/L (ref 3.5–5.1)
SODIUM: 139 meq/L (ref 135–145)

## 2014-08-05 NOTE — Progress Notes (Signed)
Cardiology Office Note   Date:  08/05/2014   ID:  Heather Frederick, DOB [birth year], MRN [id number]  PCP:  Olga Millers, MD  Cardiologist:   Sinclair Grooms, MD   Chief Complaint  Patient presents with  . DIASTOLIC HEART FAILURE, STAGE B      History of Present Illness: Heather Frederick is a 58 y.o. female who presents for diastolic heart failure, hypertension and obesity.  Very distraught over her mother's death. Foot surgery tomorrow with Dr. Durward Fortes. She is started on CPAP.    Past Medical History  Diagnosis Date  . Anxiety   . Mental disorder   . GERD (gastroesophageal reflux disease)   . Arthritis   . Hard of hearing   . Migraine headache   . Obesity   . Depression   . Lateral epicondylitis of right elbow   . Dyslipidemia   . History of frequent urinary tract infections   . IBS (irritable bowel syndrome)   . Osteoarthritis   . Gallstones     a. Seen on CT 01/2014.  Marland Kitchen Dyspnea     a. Echo 10/2013: mild LVH, EF 55-60%, mildly dilated LA. b. pBNP normal 12/2013. c. Nuc 12/2013: low risk with anterior breast attenuation and possible small area of inferoapical ischemia. Technically limited. EF 61%. d. CTA 01/2014: no PE. Mosiac perfusion of lungs may indiate small airways disease or perfusion abnormalities. Question small gallstones within gb."  . Urinary tract infection   . Family history of anesthesia complication     father has a severe hard time waking up  . Hypertension   . Raynaud disease     in feet per patient   . Meralgia paresthetica of right side 10/02/2012    slight at 05/2014    Past Surgical History  Procedure Laterality Date  . Joint replacement    . Knee arthroscopy Left 12/12  . Carpel tunnel rel Right 4/12  . Rotator cuff repair Left 6/11  . Plantar fascia release Right 12/10  . Bladder suspension  6/10  . Carpel tunnel left/right  7/08, 8/08 Bilateral 8/08 and 7/08  . Knee arthroscopy Right 12/06  . Tennis elbow release Right 7/04    . Abdominal hysterectomy  1/04    partial  . Total knee arthroplasty Left 09/10/2012    Procedure: TOTAL KNEE ARTHROPLASTY- left;  Surgeon: Garald Balding, MD;  Location: [facility];  Service: Orthopedics;  Laterality: Left;  Left total knee arthroplasty  . Left heart catheterization with coronary angiogram N/A 03/10/2014    Procedure: LEFT HEART CATHETERIZATION WITH CORONARY ANGIOGRAM;  Surgeon: Sinclair Grooms, MD;  Location: [facility] CATH LAB;  Service: Cardiovascular;  Laterality: N/A;  . Cardiac catheterization    . Colonoscopy    . Radial tunnel release      right arm   . Cholecystectomy N/A 06/11/2014    Procedure: LAPAROSCOPIC CHOLECYSTECTOMY WITH ATTEMPTED INTRAOPERATIVE CHOLANGIOGRAM;  Surgeon: Jackolyn Confer, MD;  Location: [facility];  Service: General;  Laterality: N/A;     Current Outpatient Prescriptions  Medication Sig Dispense Refill  . acetaminophen (TYLENOL) 500 MG tablet Take 1,000 mg by mouth 2 (two) times daily as needed for pain.    Marland Kitchen Alum & Mag Hydroxide-Simeth (MAGIC MOUTHWASH W/LIDOCAINE) SOLN Take 5 mLs by mouth 3 (three) times daily as needed for mouth pain. 100 mL 0  . aspirin 81 MG tablet Take 81 mg by mouth daily.    . Cholecalciferol (VITAMIN D-3)  1000 UNITS CAPS Take 2,000 Units by mouth daily.     . citalopram (CELEXA) 20 MG tablet Take 1 tablet (20 mg total) by mouth daily. 30 tablet 5  . CRANBERRY-VITAMIN C PO Take 4,200 mg by mouth daily.    . diclofenac sodium (VOLTAREN) 1 % GEL Apply 1 application topically 2 (two) times daily as needed (Pain in hip and left elbow).    . eletriptan (RELPAX) 40 MG tablet Take 1 tablet (40 mg total) by mouth every 2 (two) hours as needed for migraine. 8 tablet 5  . loratadine (CLARITIN) 10 MG tablet Take 10 mg by mouth 4 (four) times daily as needed for allergies.     . Lutein-Zeaxanthin 25-5 MG CAPS Take 1 capsule by mouth daily.    . Methylcobalamin (METHYL B-12 PO) Take 2,500 mcg by mouth daily.    . metoprolol succinate  (TOPROL-XL) 25 MG 24 hr tablet Take 25 mg by mouth daily.    . Multiple Vitamin (MULTIVITAMIN WITH MINERALS) TABS Take 1 tablet by mouth daily.    . nitroGLYCERIN (NITROSTAT) 0.4 MG SL tablet Place 1 tablet (0.4 mg total) under the tongue every 5 (five) minutes as needed. (Patient taking differently: Place 0.4 mg under the tongue every 5 (five) minutes as needed for chest pain. ) 25 tablet 3  . nystatin cream (MYCOSTATIN) Apply 1 application topically daily as needed (for itching).    . Omega-3 Fatty Acids (FISH OIL PO) Take 1,200 mg by mouth daily.    Marland Kitchen oxybutynin (DITROPAN-XL) 10 MG 24 hr tablet Take 10 mg by mouth daily.    . pantoprazole (PROTONIX) 40 MG tablet Take 1 tablet (40 mg total) by mouth 2 (two) times daily. 60 tablet 11  . pregabalin (LYRICA) 200 MG capsule TAKE ONE (1) CAPSULE BY MOUTH EACH EVENING 30 capsule 5  . simvastatin (ZOCOR) 20 MG tablet Take 1 tablet (20 mg total) by mouth daily. 30 tablet 5  . spironolactone (ALDACTONE) 25 MG tablet Take 25 mg by mouth daily.     No current facility-administered medications for this visit.    Allergies:   Topamax; Aleve; Bee venom; Echinacea; Other; Sulfa antibiotics; and Advil    Social History:  The patient  reports that she has never smoked. She has never used smokeless tobacco. She reports that she drinks alcohol. She reports that she does not use illicit drugs.   Family History:  The patient's family history includes Breast cancer in her cousin and other; Clotting disorder in her father; Colon cancer in her paternal grandmother; Coronary artery disease in her father; Hypertension in her brother, father, and mother; Kidney failure in her brother; Liver disease in her mother; Migraines in her brother, daughter, and father; Pancreatic cancer in her paternal grandmother; Stomach cancer in her paternal grandmother; Stroke in her mother. There is no history of Colon polyps or Esophageal cancer.    ROS:  Please see the history of  present illness.   Otherwise, review of systems are positive for none.   All other systems are reviewed and negative.    PHYSICAL EXAM: VS:  BP 108/60 mmHg  Pulse 56  Ht 5\' 5"  (1.651 m)  Wt 295 lb 12.8 oz (134.174 kg)  BMI 49.22 kg/m2  SpO2 94% , BMI Body mass index is 49.22 kg/(m^2). GEN: Well nourished, well developed, in no acute distress> Morbid obesity  HEENT: normal Neck: no JVD, carotid bruits, or masses Cardiac: RRR; no murmurs, rubs, or gallops,no edema  Respiratory:  clear to auscultation bilaterally, normal work of breathing GI: soft, nontender, nondistended, + BS MS: no deformity or atrophy Skin: warm and dry, no rash Neuro:  Strength and sensation are intact Psych: euthymic mood, full affect   EKG:  EKG is not ordered today.   Recent Labs: 01/13/2014: Pro B Natriuretic peptide (BNP) 10.0 06/09/2014: ALT 40*; BUN 12; Creatinine 0.87; Hemoglobin 14.6; Platelets 162; Potassium 4.2; Sodium 141    Lipid Panel    Component Value Date/Time   CHOL 153 03/10/2014 1030   TRIG 94 03/10/2014 1030   HDL 36* 03/10/2014 1030   CHOLHDL 4.3 03/10/2014 1030   VLDL 19 03/10/2014 1030   LDLCALC 98 03/10/2014 1030      Wt Readings from Last 3 Encounters:  08/05/14 295 lb 12.8 oz (134.174 kg)  07/28/14 297 lb 1.9 oz (134.773 kg)  06/30/14 297 lb (134.718 kg)      Other studies Reviewed: Additional studies/ records that were reviewed today include: .    ASSESSMENT AND PLAN:  Severe obesity (BMI >= 40): No change  OSA (obstructive sleep apnea): Just started C PAP  Diastolic heart failure, stage B: Asymptomatic   Dyslipidemia: On therapy     Current medicines are reviewed at length with the patient today.  The patient does not have concerns regarding medicines.  The following changes have been made:  no change. A basic metabolic panel will be obtained today and further adjustments in diuretic therapy may need to be made. I anticipate that edema will  auto-correct on C Pap and spironolactone may not be necessary.   Labs/ tests ordered today include:  No orders of the defined types were placed in this encounter.     Disposition:   FU with HS in 9 months  Signed, Sinclair Grooms, MD  08/05/2014 8:41 AM    [facility] [address], [place], [place]  [address] Phone: [phone]; Fax: [phone]

## 2014-08-05 NOTE — Patient Instructions (Signed)
Medication Instructions:  Your physician recommends that you continue on your current medications as directed. Please refer to the Current Medication list given to you today.   Labwork: [facility]met Today  Testing/Procedures: None   Follow-Up: Your physician wants you to follow-up in: 6-9 months wit Dr.Smith You will receive a reminder letter in the mail two months in advance. If you don't receive a letter, please call our office to schedule the follow-up appointment.   Any Other Special Instructions Will Be Listed Below (If Applicable).

## 2014-08-06 ENCOUNTER — Ambulatory Visit: Payer: 59 | Admitting: Gastroenterology

## 2014-08-07 ENCOUNTER — Ambulatory Visit: Payer: 59 | Admitting: Gastroenterology

## 2014-08-13 ENCOUNTER — Telehealth: Payer: Self-pay | Admitting: Interventional Cardiology

## 2014-08-13 NOTE — Telephone Encounter (Signed)
New message     Patient calling regarding sleep study results.

## 2014-08-13 NOTE — Telephone Encounter (Signed)
Message routed to [facility] to f/u on pt sleep questions

## 2014-08-13 NOTE — Telephone Encounter (Signed)
Patient concerned because she received a call from [facility] telling her she needs to use her PAP 4 hours nightly or her insurance will not pay for it. Patient is confused, because she thinks she uses it almost 5 hours nightly. She also does not appreciate being called when she  She st she called her insurance and they told her the time constraint is a problem on our end and that we should be more lenient.  Informed the patient that I will contact [facility] to follow-up - that maybe they need to speak directly with her insurance provider. Informed the patient she will be contacted Monday for follow-up.  Patient agrees with treatment plan.  Message sent to [facility].

## 2014-08-17 NOTE — Telephone Encounter (Signed)
Spoke with [facility]. Jonni Sanger is to call patient to clarify rules and regulations for her insurance.

## 2014-08-26 ENCOUNTER — Encounter: Payer: Self-pay | Admitting: Gastroenterology

## 2014-08-26 ENCOUNTER — Ambulatory Visit (INDEPENDENT_AMBULATORY_CARE_PROVIDER_SITE_OTHER): Payer: 59 | Admitting: Gastroenterology

## 2014-08-26 VITALS — BP 100/60 | HR 64 | Ht 63.25 in | Wt 299.1 lb

## 2014-08-26 DIAGNOSIS — K219 Gastro-esophageal reflux disease without esophagitis: Secondary | ICD-10-CM | POA: Diagnosis not present

## 2014-08-26 DIAGNOSIS — F458 Other somatoform disorders: Secondary | ICD-10-CM

## 2014-08-26 DIAGNOSIS — R0989 Other specified symptoms and signs involving the circulatory and respiratory systems: Secondary | ICD-10-CM

## 2014-08-26 MED ORDER — RANITIDINE HCL 300 MG PO CAPS: 300.0000 mg | ORAL_CAPSULE | Freq: Every evening | ORAL | Status: DC

## 2014-08-26 NOTE — Progress Notes (Signed)
    History of Present Illness: This is a 59 year old female who has persistent sensation of something stuck in her throat or a lump in her throat. Symptoms are intermittent. She relates breakthrough heartburn symptoms about twice per week. All symptoms have improved since increasing pantoprazole to twice daily. She no longer has neck tightness or chest tightness. She has no difficulty swallowing solids or liquids. Her symptoms are not associated with swallowing. She underwent EGD in 04/2011 by Dr. Howell Rucks that was normal except for a moderate sized hiatal hernia. Barium esophagram with tablet in 03/2011 was normal except sluggish primary esophageal peristalsis and a small hiatal hernia.  Current Medications, Allergies, Past Medical History, Past Surgical History, Family History and Social History were reviewed in [employer] record.  Physical Exam: General: Well developed , well nourished, obese, no acute distress Head: Normocephalic and atraumatic Eyes:  sclerae anicteric, EOMI Ears: Normal auditory acuity Mouth: No deformity or lesions Lungs: Clear throughout to auscultation Heart: Regular rate and rhythm; no murmurs, rubs or bruits Abdomen: Soft, non tender and non distended. No masses, hepatosplenomegaly or hernias noted. Normal Bowel sounds Musculoskeletal: Symmetrical with no gross deformities  Pulses:  Normal pulses noted Extremities: No clubbing, cyanosis, edema or deformities noted Neurological: Alert oriented x 4, grossly nonfocal Psychological:  Alert and cooperative. Normal mood and affect  Assessment and Recommendations:  1. Globus sensation. GERD with possible LPR. R/O ENT disorder. Follow up with ENT, Dr. Charleston Ropes. Continue pantoprazole 40 mg twice a day taken 30 minutes before breakfast and dinner. Add ranitidine 300 mg hs. Intensify antireflux measures and begin the use of 4 inch bed blocks. REV in 3 months.   2. Morbid obesity. BMI=52. Long-term  weight loss program supervised by her PCP.

## 2014-08-26 NOTE — Patient Instructions (Addendum)
Normal BMI (Body Mass Index- based on height and weight) is between 19 and 25. Your BMI today is Body mass index is 52.54 kg/(m^2). Marland Kitchen Please consider follow up  regarding your BMI with your Primary Care Provider.  Continue your pantoprazole twice daily. We have sent the following medications to your pharmacy for you to pick up at your convenience:ranitidine to take at bedtime.   Thank you for choosing me and [facility].  Pricilla Riffle. Dagoberto Ligas., MD., Marval Regal

## 2014-09-04 ENCOUNTER — Encounter: Payer: Self-pay | Admitting: Cardiology

## 2014-09-16 ENCOUNTER — Other Ambulatory Visit: Payer: Self-pay | Admitting: Otolaryngology

## 2014-09-16 DIAGNOSIS — R0989 Other specified symptoms and signs involving the circulatory and respiratory systems: Secondary | ICD-10-CM

## 2014-09-18 ENCOUNTER — Ambulatory Visit
Admission: RE | Admit: 2014-09-18 | Discharge: 2014-09-18 | Disposition: A | Discharge status: Home / Self Care | Payer: 59 | Source: Ambulatory Visit | Attending: Otolaryngology | Admitting: Otolaryngology

## 2014-09-18 DIAGNOSIS — R0989 Other specified symptoms and signs involving the circulatory and respiratory systems: Secondary | ICD-10-CM

## 2014-09-23 ENCOUNTER — Ambulatory Visit (INDEPENDENT_AMBULATORY_CARE_PROVIDER_SITE_OTHER): Payer: 59 | Admitting: Cardiology

## 2014-09-23 ENCOUNTER — Encounter: Payer: Self-pay | Admitting: Cardiology

## 2014-09-23 VITALS — BP 100/70 | HR 74 | Ht 65.0 in | Wt 293.8 lb

## 2014-09-23 DIAGNOSIS — G4733 Obstructive sleep apnea (adult) (pediatric): Secondary | ICD-10-CM

## 2014-09-23 DIAGNOSIS — I10 Essential (primary) hypertension: Secondary | ICD-10-CM | POA: Diagnosis not present

## 2014-09-23 HISTORY — DX: Essential (primary) hypertension: I10

## 2014-09-23 NOTE — Addendum Note (Signed)
Addended by: Harland German A on: 09/23/2014 12:15 PM   Modules accepted: Orders

## 2014-09-23 NOTE — Progress Notes (Signed)
Cardiology Office Note   Date:  09/23/2014   ID:  Heather Frederick October 02, 1955, MRN [id number]  PCP:  Heather Millers, MD    Chief Complaint  Patient presents with  . Follow-up    OSA      History of Present Illness: Heather Frederick is a 59 y.o. female who presents for evaluation of sleep apnea.  She was seen by Dr. Tamala Julian and due to obesity and snoring she was referred for sleep study.  She was found to have severe OSA with an AHI of 37.6/hr occurring in REM sleep in the non supine position.  She had oxygen desaturations as low as 83%.  She underwent successful CPAP titration to 18cm H2O and now presents for followup.  She is doing well with her CPAP therapy. She tolerates the nasal  mask and feels the pressure is adequate.  She denies any mouth dryness or head congestion.  Since starting the CPAP she has noticed some improvement in her daytime sleepiness but still falls asleep easily when sitting down in front of the TV.   She feels rested when she gets up in the am.  She no longer snores.  She goes to bed at 11pm-2am and gets up at 6:30am.      Past Medical History  Diagnosis Date  . Anxiety   . Mental disorder   . GERD (gastroesophageal reflux disease)   . Arthritis   . Hard of hearing   . Migraine headache   . Obesity   . Depression   . Lateral epicondylitis of right elbow   . Dyslipidemia   . History of frequent urinary tract infections   . IBS (irritable bowel syndrome)   . Osteoarthritis   . Gallstones     a. Seen on CT 01/2014.  Marland Kitchen Dyspnea     a. Echo 10/2013: mild LVH, EF 55-60%, mildly dilated LA. b. pBNP normal 12/2013. c. Nuc 12/2013: low risk with anterior breast attenuation and possible small area of inferoapical ischemia. Technically limited. EF 61%. d. CTA 01/2014: no PE. Mosiac perfusion of lungs may indiate small airways disease or perfusion abnormalities. Question small gallstones within gb."  . Urinary tract  infection   . Family history of anesthesia complication     father has a severe hard time waking up  . Hypertension   . Raynaud disease     in feet per patient   . Meralgia paresthetica of right side 10/02/2012    slight at 05/2014  . Hepatic steatosis   . Benign essential HTN 09/23/2014  . OSA (obstructive sleep apnea)     severe with AHI 37/hr now on CPAP at 18cm H2O    Past Surgical History  Procedure Laterality Date  . Joint replacement    . Knee arthroscopy Left 12/12  . Carpel tunnel rel Right 4/12  . Rotator cuff repair Left 6/11  . Plantar fascia release Right 12/10  . Bladder suspension  6/10  . Carpel tunnel left/right  7/08, 8/08 Bilateral 8/08 and 7/08  . Knee arthroscopy Right 12/06  . Tennis elbow release Right 7/04  . Abdominal hysterectomy  1/04    partial  . Total knee arthroplasty Left 09/10/2012    Procedure: TOTAL KNEE ARTHROPLASTY- left;  Surgeon: Garald Balding, MD;  Location: [facility]  OR;  Service: Orthopedics;  Laterality: Left;  Left total knee arthroplasty  . Left heart catheterization with coronary angiogram N/A 03/10/2014    Procedure: LEFT HEART CATHETERIZATION WITH CORONARY ANGIOGRAM;  Surgeon: Sinclair Grooms, MD;  Location: [facility] CATH LAB;  Service: Cardiovascular;  Laterality: N/A;  . Cardiac catheterization    . Colonoscopy    . Radial tunnel release      right arm   . Cholecystectomy N/A 06/11/2014    Procedure: LAPAROSCOPIC CHOLECYSTECTOMY WITH ATTEMPTED INTRAOPERATIVE CHOLANGIOGRAM;  Surgeon: Jackolyn Confer, MD;  Location: [facility];  Service: General;  Laterality: N/A;     Current Outpatient Prescriptions  Medication Sig Dispense Refill  . acetaminophen (TYLENOL) 500 MG tablet Take 1,000 mg by mouth 2 (two) times daily as needed for pain.    Marland Kitchen Alum & Mag Hydroxide-Simeth (MAGIC MOUTHWASH W/LIDOCAINE) SOLN Take 5 mLs by mouth 3 (three) times daily as needed for mouth pain. 100 mL 0  . aspirin 81 MG tablet Take 81 mg by mouth daily.    .  Cholecalciferol (VITAMIN D-3) 1000 UNITS CAPS Take 2,000 Units by mouth daily.     . citalopram (CELEXA) 20 MG tablet Take 1 tablet (20 mg total) by mouth daily. 30 tablet 5  . CRANBERRY-VITAMIN C PO Take 4,200 mg by mouth daily.    . diclofenac sodium (VOLTAREN) 1 % GEL Apply 1 application topically 2 (two) times daily as needed (Pain in hip and left elbow).    . eletriptan (RELPAX) 40 MG tablet Take 1 tablet (40 mg total) by mouth every 2 (two) hours as needed for migraine. 8 tablet 5  . loratadine (CLARITIN) 10 MG tablet Take 10 mg by mouth 4 (four) times daily as needed for allergies.     . Lutein-Zeaxanthin 25-5 MG CAPS Take 1 capsule by mouth daily.    . Methylcobalamin (METHYL B-12 PO) Take 2,500 mcg by mouth daily.    . metoprolol succinate (TOPROL-XL) 25 MG 24 hr tablet Take 25 mg by mouth daily.    . Multiple Vitamin (MULTIVITAMIN WITH MINERALS) TABS Take 1 tablet by mouth daily.    . nitroGLYCERIN (NITROSTAT) 0.4 MG SL tablet Place 1 tablet (0.4 mg total) under the tongue every 5 (five) minutes as needed. (Patient taking differently: Place 0.4 mg under the tongue every 5 (five) minutes as needed for chest pain. ) 25 tablet 3  . nystatin cream (MYCOSTATIN) Apply 1 application topically daily as needed (for itching).    . Omega-3 Fatty Acids (FISH OIL PO) Take 1,200 mg by mouth daily.    Marland Kitchen oxybutynin (DITROPAN-XL) 10 MG 24 hr tablet Take 10 mg by mouth daily.    . pantoprazole (PROTONIX) 40 MG tablet Take 1 tablet (40 mg total) by mouth 2 (two) times daily. 60 tablet 11  . pregabalin (LYRICA) 200 MG capsule TAKE ONE (1) CAPSULE BY MOUTH EACH EVENING 30 capsule 5  . ranitidine (ZANTAC) 300 MG capsule Take 1 capsule (300 mg total) by mouth every evening. 30 capsule 11  . simvastatin (ZOCOR) 20 MG tablet Take 1 tablet (20 mg total) by mouth daily. 30 tablet 5  . spironolactone (ALDACTONE) 25 MG tablet Take 25 mg by mouth daily.     No current facility-administered medications for this  visit.    Allergies:   Topamax; Aleve; Bee venom; Echinacea; Other; Sulfa antibiotics; and Advil    Social History:  The patient  reports that she has never smoked. She has never used smokeless  tobacco. She reports that she drinks alcohol. She reports that she does not use illicit drugs.   Family History:  The patient's family history includes Breast cancer in her cousin and other; Clotting disorder in her father; Colon cancer in her paternal grandmother; Coronary artery disease in her father; Hypertension in her brother, father, and mother; Kidney failure in her brother; Liver disease in her mother; Migraines in her brother, daughter, and father; Pancreatic cancer in her paternal grandmother; Stomach cancer in her paternal grandmother; Stroke in her mother. There is no history of Colon polyps or Esophageal cancer.    ROS:  Please see the history of present illness.   Otherwise, review of systems are positive for none.   All other systems are reviewed and negative.    PHYSICAL EXAM: VS:  BP 100/70 mmHg  Pulse 74  Ht 5\' 5"  (1.651 m)  Wt 293 lb 12.8 oz (133.267 kg)  BMI 48.89 kg/m2  SpO2 94% , BMI Body mass index is 48.89 kg/(m^2). GEN: Well nourished, well developed, in no acute distress HEENT: normal Neck: no JVD, carotid bruits, or masses Cardiac: RRR; no murmurs, rubs, or gallops,no edema  Respiratory:  clear to auscultation bilaterally, normal work of breathing GI: soft, nontender, nondistended, + BS MS: no deformity or atrophy Skin: warm and dry, no rash Neuro:  Strength and sensation are intact Psych: euthymic mood, full affect   EKG:  EKG is not ordered today.    Recent Labs: 01/13/2014: Pro B Natriuretic peptide (BNP) 10.0 06/09/2014: ALT 40*; Hemoglobin 14.6; Platelets 162 08/05/2014: BUN 15; Creatinine, Ser 0.88; Potassium 4.0; Sodium 139    Lipid Panel    Component Value Date/Time   CHOL 153 03/10/2014 1030   TRIG 94 03/10/2014 1030   HDL 36* 03/10/2014 1030    CHOLHDL 4.3 03/10/2014 1030   VLDL 19 03/10/2014 1030   LDLCALC 98 03/10/2014 1030      Wt Readings from Last 3 Encounters:  09/23/14 293 lb 12.8 oz (133.267 kg)  08/26/14 299 lb 2 oz (135.682 kg)  08/05/14 295 lb 12.8 oz (134.174 kg)      ASSESSMENT AND PLAN:  1.  Severe OSA with an AHI of 37 events per hour.  She tolerates her CPAP device.  Her d/l today showed an AHI of 3.2 events per hour on CPAP at 18cm H2O.  She is only 63% compliant in using more than 4 hours nightly. She has very poor sleep hygiene and some nights only gets 4-5 hours of sleep.  I have also instructed the patient on proper sleep hygiene, avoidance of sleeping in the supine position and avoidance of alcohol within 4 hours of bedtime.  The patient was also instructed to avoid driving if sleepy.  She is very frustrated with her nasal mask moving so much and would like to try the nasal pillow mask although the CPAP pressure may be too much to prevent air leakage.  I will order it and get a d/l 2 weeks later 2.  Morbid Obesity - I have encouraged her to get into a routine exercise program and follow a strict low fat diet. 3.  HTN - controlled on Toprol and aldactone   Current medicines are reviewed at length with the patient today.  The patient does not have concerns regarding medicines.  The following changes have been made:  no change  Labs/ tests ordered today: See above Assessment and Plan No orders of the defined types were placed in this encounter.  Disposition:   FU with me in 6 months  Signed, Sueanne Margarita, MD  09/23/2014 11:22 AM    [facility] [place], [place], [place]  [address] Phone: [phone]; Fax: [phone]

## 2014-09-23 NOTE — Patient Instructions (Signed)

## 2014-09-30 ENCOUNTER — Encounter (HOSPITAL_COMMUNITY): Payer: Self-pay | Admitting: Emergency Medicine

## 2014-09-30 ENCOUNTER — Emergency Department (HOSPITAL_COMMUNITY)
Admission: EM | Admit: 2014-09-30 | Discharge: 2014-10-01 | Disposition: A | Discharge status: Home / Self Care | Payer: 59 | Attending: Emergency Medicine | Admitting: Emergency Medicine

## 2014-09-30 DIAGNOSIS — G43909 Migraine, unspecified, not intractable, without status migrainosus: Secondary | ICD-10-CM | POA: Diagnosis not present

## 2014-09-30 DIAGNOSIS — F419 Anxiety disorder, unspecified: Secondary | ICD-10-CM | POA: Insufficient documentation

## 2014-09-30 DIAGNOSIS — R7989 Other specified abnormal findings of blood chemistry: Secondary | ICD-10-CM | POA: Diagnosis not present

## 2014-09-30 DIAGNOSIS — R112 Nausea with vomiting, unspecified: Secondary | ICD-10-CM | POA: Diagnosis not present

## 2014-09-30 DIAGNOSIS — E785 Hyperlipidemia, unspecified: Secondary | ICD-10-CM | POA: Diagnosis not present

## 2014-09-30 DIAGNOSIS — Z9981 Dependence on supplemental oxygen: Secondary | ICD-10-CM | POA: Insufficient documentation

## 2014-09-30 DIAGNOSIS — Z79899 Other long term (current) drug therapy: Secondary | ICD-10-CM | POA: Insufficient documentation

## 2014-09-30 DIAGNOSIS — R1013 Epigastric pain: Secondary | ICD-10-CM | POA: Insufficient documentation

## 2014-09-30 DIAGNOSIS — M1990 Unspecified osteoarthritis, unspecified site: Secondary | ICD-10-CM | POA: Insufficient documentation

## 2014-09-30 DIAGNOSIS — G4733 Obstructive sleep apnea (adult) (pediatric): Secondary | ICD-10-CM | POA: Insufficient documentation

## 2014-09-30 DIAGNOSIS — H9190 Unspecified hearing loss, unspecified ear: Secondary | ICD-10-CM | POA: Diagnosis not present

## 2014-09-30 DIAGNOSIS — R945 Abnormal results of liver function studies: Secondary | ICD-10-CM

## 2014-09-30 DIAGNOSIS — R1010 Upper abdominal pain, unspecified: Secondary | ICD-10-CM | POA: Diagnosis not present

## 2014-09-30 DIAGNOSIS — K219 Gastro-esophageal reflux disease without esophagitis: Secondary | ICD-10-CM | POA: Diagnosis not present

## 2014-09-30 DIAGNOSIS — I10 Essential (primary) hypertension: Secondary | ICD-10-CM | POA: Diagnosis not present

## 2014-09-30 DIAGNOSIS — F329 Major depressive disorder, single episode, unspecified: Secondary | ICD-10-CM | POA: Diagnosis not present

## 2014-09-30 DIAGNOSIS — Z87440 Personal history of urinary (tract) infections: Secondary | ICD-10-CM | POA: Diagnosis not present

## 2014-09-30 DIAGNOSIS — E669 Obesity, unspecified: Secondary | ICD-10-CM | POA: Insufficient documentation

## 2014-09-30 DIAGNOSIS — R140 Abdominal distension (gaseous): Secondary | ICD-10-CM | POA: Insufficient documentation

## 2014-09-30 LAB — LIPASE, BLOOD: Lipase: 18 U/L — ABNORMAL LOW (ref 22–51)

## 2014-09-30 LAB — CBC WITH DIFFERENTIAL/PLATELET
Basophils Absolute: 0 10*3/uL (ref 0.0–0.1)
Basophils Relative: 0 % (ref 0–1)
Eosinophils Absolute: 0.1 10*3/uL (ref 0.0–0.7)
Eosinophils Relative: 2 % (ref 0–5)
HCT: 43.3 % (ref 36.0–46.0)
HEMOGLOBIN: 14.4 g/dL (ref 12.0–15.0)
LYMPHS ABS: 0.9 10*3/uL (ref 0.7–4.0)
LYMPHS PCT: 13 % (ref 12–46)
MCH: 30.5 pg (ref 26.0–34.0)
MCHC: 33.3 g/dL (ref 30.0–36.0)
MCV: 91.7 fL (ref 78.0–100.0)
Monocytes Absolute: 0.9 10*3/uL (ref 0.1–1.0)
Monocytes Relative: 13 % — ABNORMAL HIGH (ref 3–12)
NEUTROS PCT: 72 % (ref 43–77)
Neutro Abs: 4.8 10*3/uL (ref 1.7–7.7)
PLATELETS: 173 10*3/uL (ref 150–400)
RBC: 4.72 MIL/uL (ref 3.87–5.11)
RDW: 14.6 % (ref 11.5–15.5)
WBC: 6.7 10*3/uL (ref 4.0–10.5)

## 2014-09-30 LAB — COMPREHENSIVE METABOLIC PANEL
ALT: 134 U/L — ABNORMAL HIGH (ref 14–54)
AST: 95 U/L — AB (ref 15–41)
Albumin: 3.7 g/dL (ref 3.5–5.0)
Alkaline Phosphatase: 357 U/L — ABNORMAL HIGH (ref 38–126)
Anion gap: 9 (ref 5–15)
BUN: 12 mg/dL (ref 6–20)
CALCIUM: 8.9 mg/dL (ref 8.9–10.3)
CO2: 27 mmol/L (ref 22–32)
Chloride: 102 mmol/L (ref 101–111)
Creatinine, Ser: 0.88 mg/dL (ref 0.44–1.00)
GFR calc Af Amer: >60 mL/min (ref >60)
GLUCOSE: 120 mg/dL — AB (ref 65–99)
POTASSIUM: 4.1 mmol/L (ref 3.5–5.1)
SODIUM: 138 mmol/L (ref 135–145)
TOTAL PROTEIN: 7.6 g/dL (ref 6.5–8.1)
Total Bilirubin: 3.8 mg/dL — ABNORMAL HIGH (ref 0.3–1.2)

## 2014-09-30 MED ORDER — ONDANSETRON 8 MG PO TBDP
8.0000 mg | ORAL_TABLET | Freq: Once | ORAL | Status: AC
  Administered 2014-09-30: 8 mg via ORAL
  Filled 2014-09-30: qty 1

## 2014-09-30 NOTE — ED Notes (Signed)
Pt had gall bladder removed in March-has had intermittent nausea and abdominal pain since then. Says, "when these episodes come on I have pain and tightness from the top of my stomach all the way across. I feel like I get dehydrated easily when this happens and I get constipated." Denies emesis/diarrhea/fevers. Last BM was this morning. Also reports hives last night that are absent now. Took Benadryl with alleviation of symptoms. No other c/c. RR even/unlabored.

## 2014-10-01 ENCOUNTER — Emergency Department (HOSPITAL_COMMUNITY): Payer: 59

## 2014-10-01 LAB — URINALYSIS, ROUTINE W REFLEX MICROSCOPIC
Glucose, UA: NEGATIVE mg/dL
Ketones, ur: NEGATIVE mg/dL
Nitrite: NEGATIVE
PROTEIN: NEGATIVE mg/dL
SPECIFIC GRAVITY, URINE: 1.019 (ref 1.005–1.030)
UROBILINOGEN UA: 1 mg/dL (ref 0.0–1.0)
pH: 5.5 (ref 5.0–8.0)

## 2014-10-01 LAB — URINE MICROSCOPIC-ADD ON

## 2014-10-01 LAB — I-STAT TROPONIN, ED: Troponin i, poc: 0.01 ng/mL (ref 0.00–0.08)

## 2014-10-01 MED ORDER — ELETRIPTAN HYDROBROMIDE 40 MG PO TABS
40.0000 mg | ORAL_TABLET | Freq: Once | ORAL | Status: AC
  Administered 2014-10-01: 40 mg via ORAL

## 2014-10-01 MED ORDER — OXYCODONE HCL 5 MG PO TABS: 5.0000 mg | ORAL_TABLET | ORAL | Status: DC | PRN

## 2014-10-01 MED ORDER — ONDANSETRON 4 MG PO TBDP: ORAL_TABLET | ORAL | Status: DC

## 2014-10-01 NOTE — ED Notes (Signed)
Pharmacy notified patient is taking home dose of Relpax.

## 2014-10-01 NOTE — Discharge Instructions (Signed)
Call and make an appointment to follow-up with your gastroenterologist. Return immediately for worsening pain, persistent vomiting, fever or any concerns.  Abdominal Pain, Women Abdominal (stomach, pelvic, or belly) pain can be caused by many things. It is important to tell your doctor:  The location of the pain.  Does it come and go or is it present all the time?  Are there things that start the pain (eating certain foods, exercise)?  Are there other symptoms associated with the pain (fever, nausea, vomiting, diarrhea)? All of this is helpful to know when trying to find the cause of the pain. CAUSES   Stomach: virus or bacteria infection, or ulcer.  Intestine: appendicitis (inflamed appendix), regional ileitis (Crohn's disease), ulcerative colitis (inflamed colon), irritable bowel syndrome, diverticulitis (inflamed diverticulum of the colon), or cancer of the stomach or intestine.  Gallbladder disease or stones in the gallbladder.  Kidney disease, kidney stones, or infection.  Pancreas infection or cancer.  Fibromyalgia (pain disorder).  Diseases of the female organs:  Uterus: fibroid (non-cancerous) tumors or infection.  Fallopian tubes: infection or tubal pregnancy.  Ovary: cysts or tumors.  Pelvic adhesions (scar tissue).  Endometriosis (uterus lining tissue growing in the pelvis and on the pelvic organs).  Pelvic congestion syndrome (female organs filling up with blood just before the menstrual period).  Pain with the menstrual period.  Pain with ovulation (producing an egg).  Pain with an IUD (intrauterine device, birth control) in the uterus.  Cancer of the female organs.  Functional pain (pain not caused by a disease, may improve without treatment).  Psychological pain.  Depression. DIAGNOSIS  Your doctor will decide the seriousness of your pain by doing an examination.  Blood tests.  X-rays.  Ultrasound.  CT scan (computed tomography, special  type of X-ray).  MRI (magnetic resonance imaging).  Cultures, for infection.  Barium enema (dye inserted in the large intestine, to better view it with X-rays).  Colonoscopy (looking in intestine with a lighted tube).  Laparoscopy (minor surgery, looking in abdomen with a lighted tube).  Major abdominal exploratory surgery (looking in abdomen with a large incision). TREATMENT  The treatment will depend on the cause of the pain.   Many cases can be observed and treated at home.  Over-the-counter medicines recommended by your caregiver.  Prescription medicine.  Antibiotics, for infection.  Birth control pills, for painful periods or for ovulation pain.  Hormone treatment, for endometriosis.  Nerve blocking injections.  Physical therapy.  Antidepressants.  Counseling with a psychologist or psychiatrist.  Minor or major surgery. HOME CARE INSTRUCTIONS   Do not take laxatives, unless directed by your caregiver.  Take over-the-counter pain medicine only if ordered by your caregiver. Do not take aspirin because it can cause an upset stomach or bleeding.  Try a clear liquid diet (broth or water) as ordered by your caregiver. Slowly move to a bland diet, as tolerated, if the pain is related to the stomach or intestine.  Have a thermometer and take your temperature several times a day, and record it.  Bed rest and sleep, if it helps the pain.  Avoid sexual intercourse, if it causes pain.  Avoid stressful situations.  Keep your follow-up appointments and tests, as your caregiver orders.  If the pain does not go away with medicine or surgery, you may try:  Acupuncture.  Relaxation exercises (yoga, meditation).  Group therapy.  Counseling. SEEK MEDICAL CARE IF:   You notice certain foods cause stomach pain.  Your home care treatment  is not helping your pain.  You need stronger pain medicine.  You want your IUD removed.  You feel faint or  lightheaded.  You develop nausea and vomiting.  You develop a rash.  You are having side effects or an allergy to your medicine. SEEK IMMEDIATE MEDICAL CARE IF:   Your pain does not go away or gets worse.  You have a fever.  Your pain is felt only in portions of the abdomen. The right side could possibly be appendicitis. The left lower portion of the abdomen could be colitis or diverticulitis.  You are passing blood in your stools (bright red or black tarry stools, with or without vomiting).  You have blood in your urine.  You develop chills, with or without a fever.  You pass out. MAKE SURE YOU:   Understand these instructions.  Will watch your condition.  Will get help right away if you are not doing well or get worse. Document Released: 01/08/2007 Document Revised: 07/28/2013 Document Reviewed: 01/28/2009 [facility] Patient Information 2015 [place], [place]. This information is not intended to replace advice given to you by your health care provider. Make sure you discuss any questions you have with your health care provider.

## 2014-10-01 NOTE — ED Provider Notes (Signed)
CSN: [id number]     Arrival date & time 09/30/14  2014 History   First MD Initiated Contact with Patient 10/01/14 0008     Chief Complaint  Patient presents with  . Nausea  . Abdominal Pain     (Consider location/radiation/quality/duration/timing/severity/associated sxs/prior Treatment) HPI Patient presents with 3 months of effort abdominal pain associated with nausea and vomiting. She states the pain is worsened since she's had her gallbladder removed. The pain is not associated with food. She is followed by gastroenterologist Dr. Fuller Plan. Recently increased her PPI. Patient had onset of pain starting this afternoon at 4 PM. She states it's darts in the epigastric and spreads across the upper part of her abdomen. Associated with abdominal distention. She had mild nausea but no vomiting. Normal bowel movements. No blood in stool. She states that her pain is improved since arriving to the emergency department. She's had no fever or chills. Past Medical History  Diagnosis Date  . Anxiety   . Mental disorder   . GERD (gastroesophageal reflux disease)   . Arthritis   . Hard of hearing   . Migraine headache   . Obesity   . Depression   . Lateral epicondylitis of right elbow   . Dyslipidemia   . History of frequent urinary tract infections   . IBS (irritable bowel syndrome)   . Osteoarthritis   . Gallstones     a. Seen on CT 01/2014.  Marland Kitchen Dyspnea     a. Echo 10/2013: mild LVH, EF 55-60%, mildly dilated LA. b. pBNP normal 12/2013. c. Nuc 12/2013: low risk with anterior breast attenuation and possible small area of inferoapical ischemia. Technically limited. EF 61%. d. CTA 01/2014: no PE. Mosiac perfusion of lungs may indiate small airways disease or perfusion abnormalities. Question small gallstones within gb."  . Urinary tract infection   . Family history of anesthesia complication     father has a severe hard time waking up  . Hypertension   . Raynaud disease     in feet per patient   .  Meralgia paresthetica of right side 10/02/2012    slight at 05/2014  . Hepatic steatosis   . Benign essential HTN 09/23/2014  . OSA (obstructive sleep apnea)     severe with AHI 37/hr now on CPAP at 18cm H2O   Past Surgical History  Procedure Laterality Date  . Joint replacement    . Knee arthroscopy Left 12/12  . Carpel tunnel rel Right 4/12  . Rotator cuff repair Left 6/11  . Plantar fascia release Right 12/10  . Bladder suspension  6/10  . Carpel tunnel left/right  7/08, 8/08 Bilateral 8/08 and 7/08  . Knee arthroscopy Right 12/06  . Tennis elbow release Right 7/04  . Abdominal hysterectomy  1/04    partial  . Total knee arthroplasty Left 09/10/2012    Procedure: TOTAL KNEE ARTHROPLASTY- left;  Surgeon: Garald Balding, MD;  Location: [facility];  Service: Orthopedics;  Laterality: Left;  Left total knee arthroplasty  . Left heart catheterization with coronary angiogram N/A 03/10/2014    Procedure: LEFT HEART CATHETERIZATION WITH CORONARY ANGIOGRAM;  Surgeon: Sinclair Grooms, MD;  Location: [facility] CATH LAB;  Service: Cardiovascular;  Laterality: N/A;  . Cardiac catheterization    . Colonoscopy    . Radial tunnel release      right arm   . Cholecystectomy N/A 06/11/2014    Procedure: LAPAROSCOPIC CHOLECYSTECTOMY WITH ATTEMPTED INTRAOPERATIVE CHOLANGIOGRAM;  Surgeon: Jackolyn Confer, MD;  Location: [facility] ORS;  Service: General;  Laterality: N/A;   Family History  Problem Relation Age of Onset  . Hypertension Mother   . Stroke Mother   . Liver disease Mother     Abcess  . Hypertension Father   . Coronary artery disease Father   . Migraines Father   . Clotting disorder Father   . Kidney failure Brother   . Hypertension Brother   . Migraines Brother   . Breast cancer Other     Niece with breast cancer  . Migraines Daughter   . Colon cancer Paternal Grandmother   . Pancreatic cancer Paternal Grandmother   . Stomach cancer Paternal Grandmother   . Colon polyps Neg Hx   . Esophageal  cancer Neg Hx   . Breast cancer Cousin    History  Substance Use Topics  . Smoking status: Never Smoker   . Smokeless tobacco: Never Used     Comment: Secondhand - from family growing up, workplace intermittently  . Alcohol Use: 0.0 oz/week    0 Standard drinks or equivalent per week     Comment: occasionally - intermittent, no more than twice a week   OB History    No data available     Review of Systems  Constitutional: Negative for fever and chills.  Respiratory: Negative for shortness of breath.   Cardiovascular: Negative for chest pain.  Gastrointestinal: Positive for nausea, abdominal pain and abdominal distention. Negative for vomiting, diarrhea, constipation and blood in stool.  Genitourinary: Negative for dysuria, frequency and flank pain.  Musculoskeletal: Negative for back pain, neck pain and neck stiffness.  Skin: Negative for rash and wound.  Neurological: Negative for dizziness, weakness, light-headedness, numbness and headaches.  All other systems reviewed and are negative.     Allergies  Topamax; Aleve; Bee venom; Echinacea; Other; Sulfa antibiotics; and Advil  Home Medications   Prior to Admission medications   Medication Sig Start Date End Date Taking? Authorizing Provider  aspirin 81 MG tablet Take 81 mg by mouth every morning.    Yes Historical Provider, MD  Cholecalciferol (VITAMIN D-3) 1000 UNITS CAPS Take 2,000 Units by mouth daily.    Yes Historical Provider, MD  citalopram (CELEXA) 20 MG tablet Take 1 tablet (20 mg total) by mouth daily. 07/28/14  Yes Olga Millers, MD  CRANBERRY-VITAMIN C PO Take 4,200 mg by mouth daily.   Yes Historical Provider, MD  diclofenac sodium (VOLTAREN) 1 % GEL Apply 1 application topically 2 (two) times daily as needed (Pain in hip and left elbow).   Yes Historical Provider, MD  diphenhydrAMINE (BENADRYL) 25 mg capsule Take 50 mg by mouth every 6 (six) hours as needed for itching.   Yes Historical Provider, MD   docusate sodium (COLACE) 100 MG capsule Take 100 mg by mouth 2 (two) times daily as needed for mild constipation.   Yes Historical Provider, MD  eletriptan (RELPAX) 40 MG tablet Take 1 tablet (40 mg total) by mouth every 2 (two) hours as needed for migraine. 06/24/14  Yes Kathrynn Ducking, MD  loratadine (CLARITIN) 10 MG tablet Take 20 mg by mouth 2 (two) times daily as needed for allergies.    Yes Historical Provider, MD  Lutein-Zeaxanthin 25-5 MG CAPS Take 1 capsule by mouth daily.   Yes Historical Provider, MD  Methylcobalamin (METHYL B-12 PO) Take 2,500 mcg by mouth daily.   Yes Historical Provider, MD  metoprolol succinate (TOPROL-XL) 25 MG 24 hr tablet Take 25 mg by  mouth daily.   Yes Historical Provider, MD  Multiple Vitamin (MULTIVITAMIN WITH MINERALS) TABS Take 1 tablet by mouth daily.   Yes Historical Provider, MD  Omega-3 Fatty Acids (FISH OIL PO) Take 1,200 mg by mouth daily.   Yes Historical Provider, MD  oxybutynin (DITROPAN-XL) 10 MG 24 hr tablet Take 10 mg by mouth daily. 05/15/13  Yes Historical Provider, MD  pantoprazole (PROTONIX) 40 MG tablet Take 1 tablet (40 mg total) by mouth 2 (two) times daily. 05/08/14  Yes Ladene Artist, MD  pregabalin (LYRICA) 200 MG capsule Take 200 mg by mouth every morning.   Yes Historical Provider, MD  ranitidine (ZANTAC) 300 MG capsule Take 1 capsule (300 mg total) by mouth every evening. 08/26/14  Yes Ladene Artist, MD  simvastatin (ZOCOR) 20 MG tablet Take 1 tablet (20 mg total) by mouth daily. 07/28/14  Yes Olga Millers, MD  spironolactone (ALDACTONE) 25 MG tablet Take 25 mg by mouth daily.   Yes Historical Provider, MD  Alum & Mag Hydroxide-Simeth (MAGIC MOUTHWASH W/LIDOCAINE) SOLN Take 5 mLs by mouth 3 (three) times daily as needed for mouth pain. Patient not taking: Reported on 09/30/2014 07/28/14   Olga Millers, MD  nitroGLYCERIN (NITROSTAT) 0.4 MG SL tablet Place 1 tablet (0.4 mg total) under the tongue every 5 (five) minutes as  needed. Patient taking differently: Place 0.4 mg under the tongue every 5 (five) minutes as needed for chest pain.  03/09/14   Dayna N Dunn, PA-C  ondansetron (ZOFRAN ODT) 4 MG disintegrating tablet 4mg  ODT q4 hours prn nausea/vomit 10/01/14   Julianne Rice, MD  oxyCODONE (ROXICODONE) 5 MG immediate release tablet Take 1 tablet (5 mg total) by mouth every 4 (four) hours as needed for severe pain. 10/01/14   Julianne Rice, MD  pregabalin (LYRICA) 200 MG capsule TAKE ONE (1) CAPSULE BY MOUTH EACH EVENING Patient not taking: Reported on 09/30/2014 06/24/14   Kathrynn Ducking, MD   BP 110/60 mmHg  Pulse 80  Temp(Src) 98.2 F (36.8 C) (Oral)  Resp 18  Ht 5\' 5"  (1.651 m)  Wt 295 lb (133.811 kg)  BMI 49.09 kg/m2  SpO2 94% Physical Exam  Constitutional: She is oriented to person, place, and time. She appears well-developed and well-nourished. No distress.  HENT:  Head: Normocephalic and atraumatic.  Mouth/Throat: Oropharynx is clear and moist.  Eyes: EOM are normal. Pupils are equal, round, and reactive to light.  Neck: Normal range of motion. Neck supple.  Cardiovascular: Normal rate and regular rhythm.   Pulmonary/Chest: Effort normal and breath sounds normal. No respiratory distress. She has no wheezes. She has no rales. She exhibits no tenderness.  Abdominal: Soft. Bowel sounds are normal. She exhibits no distension and no mass. There is tenderness (mild epigastric tenderness to palpation. No rebound or guarding.). There is no rebound and no guarding.  Musculoskeletal: Normal range of motion. She exhibits no edema or tenderness.  No CVA tenderness bilaterally. No lower extremity swelling or pain.  Neurological: She is alert and oriented to person, place, and time.  5/5 motor in all extremities. Sensation is grossly intact.  Skin: Skin is warm and dry. No rash noted. No erythema.  Psychiatric: She has a normal mood and affect. Her behavior is normal.  Nursing note and vitals reviewed.   ED  Course  Procedures (including critical care time) Labs Review Labs Reviewed  CBC WITH DIFFERENTIAL/PLATELET - Abnormal; Notable for the following:    Monocytes Relative 13 (*)  All other components within normal limits  COMPREHENSIVE METABOLIC PANEL - Abnormal; Notable for the following:    Glucose, Bld 120 (*)    AST 95 (*)    ALT 134 (*)    Alkaline Phosphatase 357 (*)    Total Bilirubin 3.8 (*)    All other components within normal limits  LIPASE, BLOOD - Abnormal; Notable for the following:    Lipase 18 (*)    All other components within normal limits  URINALYSIS, ROUTINE W REFLEX MICROSCOPIC (NOT AT [facility]) - Abnormal; Notable for the following:    Color, Urine AMBER (*)    APPearance CLOUDY (*)    Hgb urine dipstick MODERATE (*)    Bilirubin Urine MODERATE (*)    Leukocytes, UA MODERATE (*)    All other components within normal limits  URINE MICROSCOPIC-ADD ON - Abnormal; Notable for the following:    Squamous Epithelial / LPF FEW (*)    All other components within normal limits  I-STAT TROPOININ, ED    Imaging Review US Abdomen Limited  10/01/2014   CLINICAL DATA:  Upper abdominal pain  EXAM: US ABDOMEN LIMITED - RIGHT UPPER QUADRANT  COMPARISON:  05/11/2014  FINDINGS: Gallbladder:  Surgically absent  Common bile duct:  Limited visualization due to patient body habitus. Diameter is 3 mm where seen.  Liver:  Heterogeneous liver with poor acoustic transmission, likely steatosis. No visible mass. Antegrade flow in the imaged portal venous system.  IMPRESSION: 1. No acute findings. 2. Limited visualization of the common bile duct. No biliary duct dilatation. 3. Probable hepatic steatosis. 4. Cholecystectomy.   Electronically Signed   By: Monte Fantasia M.D.   On: 10/01/2014 02:31     EKG Interpretation None      MDM   Final diagnoses:  Upper abdominal pain  Elevated liver function tests    Patient's pain is completely resolved at this point. She is tolerating liquids  with no nausea or vomiting. UA with some white blood cells but rare bacteria. Nitrate negative. Patient is having no urinary symptoms. We'll await urine cultures.  Patient also has elevation in some of her LFTs. Abdominal ultrasound without any definite abnormalities of the common bile duct though poorly visualized due to body habitus. Have advised the patient follow-up closely with her gastroenterologist. She's been given return precautions and is voiced understanding.    Julianne Rice, MD 10/01/14 [phone]

## 2014-10-01 NOTE — ED Notes (Signed)
Patient states after her cholecystectomy in March she has continued to have pain and nausea, intermittently. Patient states symptoms start as a sensation that "someone is touching her esophagus" denies a sensation of pressure or tightness, states the pain then moves to her sternum and will cause sharp pain around her abd. Patient states she is also starting to have a rash she describes as hives that start on her hands and feet, this has occurred with the past two episodes of pain. Patient states pain episodes are intermittent and varying in length. Patient has been seen by GI and ENT for evaluation, states she also recently had a thyroid US and an endoscopy/colonscopy in 2013, dx with hiatal hernia and colon polyps.

## 2014-10-01 NOTE — ED Notes (Signed)
Dr Yelverton at bedside.  

## 2014-10-01 NOTE — ED Notes (Signed)
US at bedside

## 2014-10-01 NOTE — ED Notes (Addendum)
[place] completed. Patient requesting a dose of her home migraine medication (Relpax 40mg ). Patient states she has not taken a dose at home since yesterday. MD notified.

## 2014-10-07 ENCOUNTER — Other Ambulatory Visit (INDEPENDENT_AMBULATORY_CARE_PROVIDER_SITE_OTHER): Payer: 59

## 2014-10-07 ENCOUNTER — Ambulatory Visit (INDEPENDENT_AMBULATORY_CARE_PROVIDER_SITE_OTHER): Payer: 59 | Admitting: Physician Assistant

## 2014-10-07 ENCOUNTER — Encounter: Payer: Self-pay | Admitting: Physician Assistant

## 2014-10-07 VITALS — BP 102/62 | HR 64 | Ht 65.0 in | Wt 290.5 lb

## 2014-10-07 DIAGNOSIS — R1013 Epigastric pain: Secondary | ICD-10-CM

## 2014-10-07 DIAGNOSIS — R945 Abnormal results of liver function studies: Secondary | ICD-10-CM

## 2014-10-07 DIAGNOSIS — R7989 Other specified abnormal findings of blood chemistry: Secondary | ICD-10-CM

## 2014-10-07 LAB — CBC WITH DIFFERENTIAL/PLATELET
Basophils Absolute: 0 10*3/uL (ref 0.0–0.1)
Basophils Relative: 0.6 % (ref 0.0–3.0)
Eosinophils Absolute: 0.1 10*3/uL (ref 0.0–0.7)
Eosinophils Relative: 2 % (ref 0.0–5.0)
HEMATOCRIT: 40.8 % (ref 36.0–46.0)
HEMOGLOBIN: 13.8 g/dL (ref 12.0–15.0)
Lymphocytes Relative: 25 % (ref 12.0–46.0)
Lymphs Abs: 1.9 10*3/uL (ref 0.7–4.0)
MCHC: 33.8 g/dL (ref 30.0–36.0)
MCV: 88.8 fl (ref 78.0–100.0)
MONO ABS: 0.8 10*3/uL (ref 0.1–1.0)
MONOS PCT: 10.2 % (ref 3.0–12.0)
NEUTROS ABS: 4.6 10*3/uL (ref 1.4–7.7)
Neutrophils Relative %: 62.2 % (ref 43.0–77.0)
Platelets: 255 10*3/uL (ref 150.0–400.0)
RBC: 4.59 Mil/uL (ref 3.87–5.11)
RDW: 15.3 % (ref 11.5–15.5)
WBC: 7.4 10*3/uL (ref 4.0–10.5)

## 2014-10-07 LAB — HEPATIC FUNCTION PANEL
ALK PHOS: 480 U/L — AB (ref 39–117)
ALT: 171 U/L — ABNORMAL HIGH (ref 0–35)
AST: 111 U/L — ABNORMAL HIGH (ref 0–37)
Albumin: 4 g/dL (ref 3.5–5.2)
BILIRUBIN DIRECT: 2.8 mg/dL — AB (ref 0.0–0.3)
Total Bilirubin: 3.9 mg/dL — ABNORMAL HIGH (ref 0.2–1.2)
Total Protein: 7.5 g/dL (ref 6.0–8.3)

## 2014-10-07 NOTE — Progress Notes (Signed)
Reviewed and agree with management plan.  Lauralei Clouse T. Anner Baity, MD FACG 

## 2014-10-07 NOTE — Patient Instructions (Signed)
Please go to the basement level to have your labs drawn.  You have been scheduled for an ERCP procedure.  Please follow written instructions given to you at your visit today. If you use inhalers (even only as needed), please bring them with you on the day of your procedure.

## 2014-10-07 NOTE — Progress Notes (Signed)
Patient ID: Heather Frederick, female   DOB: [birth year], 59 y.o.   MRN: [id number]   Subjective:    Patient ID: Heather Frederick, female    DOB: Aug 01, 1955, 59 y.o.   MRN: [id number]  HPI Heather Frederick is a 59 year old white female known to Dr. Fuller Plan prior history of GERD. She was last seen in the office in June with globus sensation and I was increased to twice daily. Patient comes in today after an ER visit on 10/02/2014. She had undergone a laparoscopic cholecystectomy in March 2016 for symptomatic cholelithiasis and at that time was noted to have mildly elevated transaminases. IOC was unsuccessful. Patient says that within a month of having her gallbladder out she started having recurrent episodes of epigastric pain. Says some of these are mild and dissipate. Quickly and others have been severe. Over the past couple of months she is having at least one severe episode every week and says these are intense and may last for hours. She feels pain high in the epigastrium sometimes radiating into her chest and her back. She will have some nausea usually no vomiting. She says she has also had some chills with these episodes. She decided describes the pain as a severe squeezing type pain. She went to the emergency room on July 8 because of a severe episode and was noted to have a bilirubin of 3.8 alkaline phosphatase 357 ,AST of 95, and ALT of 134. ,Upper abdominal ultrasound was done showing her to be status post cholecystectomy however there was limited view of the common bile duct due to her body habitus and she was noted to have steatosis. She was given analgesics and antibiotics. She says she's not had any severe episodes since then  Review of Systems Pertinent positive and negative review of systems were noted in the above HPI section.  All other review of systems was otherwise negative.  Outpatient Encounter Prescriptions as of 10/07/2014  Medication Sig  . Alum & Mag Hydroxide-Simeth (MAGIC MOUTHWASH  W/LIDOCAINE) SOLN Take 5 mLs by mouth 3 (three) times daily as needed for mouth pain.  Marland Kitchen aspirin 81 MG tablet Take 81 mg by mouth every morning.   . Cholecalciferol (VITAMIN D-3) 1000 UNITS CAPS Take 2,000 Units by mouth daily.   . citalopram (CELEXA) 20 MG tablet Take 1 tablet (20 mg total) by mouth daily.  Marland Kitchen CRANBERRY-VITAMIN C PO Take 4,200 mg by mouth daily.  . diclofenac sodium (VOLTAREN) 1 % GEL Apply 1 application topically 2 (two) times daily as needed (Pain in hip and left elbow).  . diphenhydrAMINE (BENADRYL) 25 mg capsule Take 50 mg by mouth every 6 (six) hours as needed for itching.  . docusate sodium (COLACE) 100 MG capsule Take 100 mg by mouth 2 (two) times daily as needed for mild constipation.  Marland Kitchen eletriptan (RELPAX) 40 MG tablet Take 1 tablet (40 mg total) by mouth every 2 (two) hours as needed for migraine.  Marland Kitchen loratadine (CLARITIN) 10 MG tablet Take 20 mg by mouth 2 (two) times daily as needed for allergies.   . Lutein-Zeaxanthin 25-5 MG CAPS Take 1 capsule by mouth daily.  . Methylcobalamin (METHYL B-12 PO) Take 2,500 mcg by mouth daily.  . metoprolol succinate (TOPROL-XL) 25 MG 24 hr tablet Take 25 mg by mouth daily.  . Multiple Vitamin (MULTIVITAMIN WITH MINERALS) TABS Take 1 tablet by mouth daily.  . nitroGLYCERIN (NITROSTAT) 0.4 MG SL tablet Place 1 tablet (0.4 mg total) under the tongue every 5 (five) minutes  as needed. (Patient taking differently: Place 0.4 mg under the tongue every 5 (five) minutes as needed for chest pain. )  . Omega-3 Fatty Acids (FISH OIL PO) Take 1,200 mg by mouth daily.  . ondansetron (ZOFRAN ODT) 4 MG disintegrating tablet 4mg  ODT q4 hours prn nausea/vomit  . oxybutynin (DITROPAN-XL) 10 MG 24 hr tablet Take 10 mg by mouth daily.  Marland Kitchen oxyCODONE (ROXICODONE) 5 MG immediate release tablet Take 1 tablet (5 mg total) by mouth every 4 (four) hours as needed for severe pain.  . pantoprazole (PROTONIX) 40 MG tablet Take 1 tablet (40 mg total) by mouth 2 (two)  times daily.  . pregabalin (LYRICA) 200 MG capsule TAKE ONE (1) CAPSULE BY MOUTH EACH EVENING  . pregabalin (LYRICA) 200 MG capsule Take 200 mg by mouth every morning.  . ranitidine (ZANTAC) 300 MG capsule Take 1 capsule (300 mg total) by mouth every evening.  . simvastatin (ZOCOR) 20 MG tablet Take 1 tablet (20 mg total) by mouth daily.  Marland Kitchen spironolactone (ALDACTONE) 25 MG tablet Take 25 mg by mouth daily.   No facility-administered encounter medications on file as of 10/07/2014.   Allergies  Allergen Reactions  . Topamax [Topiramate] Other (See Comments)    :Stroke like symptoms  . Aleve [Naproxen Sodium] Hives    Has tolerated Voltaren topical as well as aspirin.  . Bee Venom Swelling  . Echinacea Hives  . Other Other (See Comments)    Feathers cause sinus congestion  . Sulfa Antibiotics Hives  . Advil [Ibuprofen] Hives    Has tolerated Voltaren topical as well as aspirin.   Patient Active Problem List   Diagnosis Date Noted  . Benign essential HTN 09/23/2014  . Routine general medical examination at a health care facility 08/01/2014  . OSA (obstructive sleep apnea) 07/04/2014  . Fullness of neck 03/25/2014  . Dyslipidemia 03/09/2014  . Diastolic heart failure, stage B   . GERD (gastroesophageal reflux disease)   . Meralgia paresthetica of right side 10/02/2012  . Osteoarthritis of left knee 09/12/2012  . Migraines 09/12/2012  . Severe obesity (BMI >= 40) 09/12/2012   History   Social History  . Marital Status: Married    Spouse Name: Remo Lipps  . Number of Children: 2  . Years of Education: 16   Occupational History  . [employer]    Social History Main Topics  . Smoking status: Never Smoker   . Smokeless tobacco: Never Used     Comment: Secondhand - from family growing up, workplace intermittently  . Alcohol Use: 0.0 oz/week    0 Standard drinks or equivalent per week     Comment: occasionally - intermittent, no more than twice a week  . Drug Use: No  . Sexual  Activity: Not on file   Other Topics Concern  . Not on file   Social History Narrative   Patient is married Remo Lipps) and lives at home with her husband and child.   Patient has two children.   Patient has a college education.   Patient is right-handed.   Patient drinks 1-2 cup of coffee/tea daily.    Ms. Hilleary family history includes Breast cancer in her cousin and other; Clotting disorder in her father; Colon cancer in her paternal grandmother; Coronary artery disease in her father; Hypertension in her brother, father, and mother; Kidney failure in her brother; Liver disease in her mother; Migraines in her brother, daughter, and father; Pancreatic cancer in her paternal grandmother; Stomach cancer in her  paternal grandmother; Stroke in her mother. There is no history of Colon polyps or Esophageal cancer.      Objective:    Filed Vitals:   10/07/14 1501  BP: 102/62  Pulse: 64    Physical Exam  well-developed obese white female in no acute distress, quite pleasant blood pressure 102/62 pulse 64 height 5 foot 5 weight 290, BMI 52 ,HEENT; nontraumatic normocephalic EOMI PERRLA sclera anicteric, Supple, Cardiovascular; regular rate and rhythm with S1-S2 no murmur or gallop, Pulmonary; clear bilaterally, Abdomen; morbidly obese soft nontender no palpable mass or hepatosplenomegaly, bowel sounds are present, Rectal; exam not done, Extremities ;no clubbing cyanosis or edema no obvious icterus, Neuropsych ;mood and affect appropriate       Assessment & Plan:   #1 58  Yo female s/p lap chole 05/2014 with recurrent intense episodes of epigastric pain and nausea , now occurring weekly. Most recent episode with documented elevation and  LFT's and tbili 3.8  She very likely has choledocholithiasis. #2 morbid obesity #3 GERD #4 OSA #5 HTN  Plan; Repeat labs today  Discussed in detail  with Dr. Fuller Plan, and we will proceed directly to ERCP with plan for stone extraction  Pt has Zofran and  hydrocodone at home to use prn. She is advised to seek ER care for any further severe prolonged episodes in the interim. - if this happens before ERCP is done she will require admission to hospital for more emergent ERCP. Pt is aware of plan. ERCP is reviewed in detail with pt including 5% risk of pancreatitis and she agrees to proceed.   Amy Genia Harold PA-C 10/07/2014   Cc: Olga Millers, MD

## 2014-10-08 ENCOUNTER — Encounter: Payer: Self-pay | Admitting: Gastroenterology

## 2014-10-09 ENCOUNTER — Encounter (HOSPITAL_COMMUNITY): Payer: Self-pay | Admitting: *Deleted

## 2014-10-19 ENCOUNTER — Encounter (HOSPITAL_COMMUNITY): Payer: Self-pay | Admitting: *Deleted

## 2014-10-19 ENCOUNTER — Ambulatory Visit (HOSPITAL_COMMUNITY): Payer: 59 | Admitting: Certified Registered"

## 2014-10-19 ENCOUNTER — Telehealth: Payer: Self-pay

## 2014-10-19 ENCOUNTER — Encounter (HOSPITAL_COMMUNITY)
Admission: RE | Disposition: A | Discharge status: Home / Self Care | Payer: Self-pay | Source: Ambulatory Visit | Attending: Gastroenterology

## 2014-10-19 ENCOUNTER — Ambulatory Visit (HOSPITAL_COMMUNITY)
Admission: RE | Admit: 2014-10-19 | Discharge: 2014-10-19 | Disposition: A | Discharge status: Home / Self Care | Payer: 59 | Source: Ambulatory Visit | Attending: Gastroenterology | Admitting: Gastroenterology

## 2014-10-19 ENCOUNTER — Ambulatory Visit (HOSPITAL_COMMUNITY): Payer: 59

## 2014-10-19 DIAGNOSIS — M179 Osteoarthritis of knee, unspecified: Secondary | ICD-10-CM | POA: Diagnosis not present

## 2014-10-19 DIAGNOSIS — Z79899 Other long term (current) drug therapy: Secondary | ICD-10-CM | POA: Insufficient documentation

## 2014-10-19 DIAGNOSIS — F419 Anxiety disorder, unspecified: Secondary | ICD-10-CM | POA: Insufficient documentation

## 2014-10-19 DIAGNOSIS — G4733 Obstructive sleep apnea (adult) (pediatric): Secondary | ICD-10-CM | POA: Diagnosis not present

## 2014-10-19 DIAGNOSIS — R7989 Other specified abnormal findings of blood chemistry: Secondary | ICD-10-CM | POA: Diagnosis not present

## 2014-10-19 DIAGNOSIS — G43909 Migraine, unspecified, not intractable, without status migrainosus: Secondary | ICD-10-CM | POA: Diagnosis not present

## 2014-10-19 DIAGNOSIS — Z7982 Long term (current) use of aspirin: Secondary | ICD-10-CM | POA: Insufficient documentation

## 2014-10-19 DIAGNOSIS — I2510 Atherosclerotic heart disease of native coronary artery without angina pectoris: Secondary | ICD-10-CM | POA: Diagnosis not present

## 2014-10-19 DIAGNOSIS — K8030 Calculus of bile duct with cholangitis, unspecified, without obstruction: Secondary | ICD-10-CM | POA: Insufficient documentation

## 2014-10-19 DIAGNOSIS — K8033 Calculus of bile duct with acute cholangitis with obstruction: Secondary | ICD-10-CM

## 2014-10-19 DIAGNOSIS — I739 Peripheral vascular disease, unspecified: Secondary | ICD-10-CM | POA: Diagnosis not present

## 2014-10-19 DIAGNOSIS — Z9049 Acquired absence of other specified parts of digestive tract: Secondary | ICD-10-CM | POA: Insufficient documentation

## 2014-10-19 DIAGNOSIS — K219 Gastro-esophageal reflux disease without esophagitis: Secondary | ICD-10-CM | POA: Diagnosis not present

## 2014-10-19 DIAGNOSIS — K5710 Diverticulosis of small intestine without perforation or abscess without bleeding: Secondary | ICD-10-CM | POA: Diagnosis not present

## 2014-10-19 DIAGNOSIS — I10 Essential (primary) hypertension: Secondary | ICD-10-CM | POA: Diagnosis not present

## 2014-10-19 DIAGNOSIS — Z800 Family history of malignant neoplasm of digestive organs: Secondary | ICD-10-CM | POA: Insufficient documentation

## 2014-10-19 DIAGNOSIS — K8050 Calculus of bile duct without cholangitis or cholecystitis without obstruction: Secondary | ICD-10-CM | POA: Insufficient documentation

## 2014-10-19 DIAGNOSIS — F329 Major depressive disorder, single episode, unspecified: Secondary | ICD-10-CM | POA: Insufficient documentation

## 2014-10-19 DIAGNOSIS — Z9989 Dependence on other enabling machines and devices: Secondary | ICD-10-CM | POA: Insufficient documentation

## 2014-10-19 DIAGNOSIS — E785 Hyperlipidemia, unspecified: Secondary | ICD-10-CM | POA: Insufficient documentation

## 2014-10-19 DIAGNOSIS — R1013 Epigastric pain: Secondary | ICD-10-CM | POA: Diagnosis not present

## 2014-10-19 DIAGNOSIS — I5030 Unspecified diastolic (congestive) heart failure: Secondary | ICD-10-CM | POA: Diagnosis not present

## 2014-10-19 DIAGNOSIS — G5711 Meralgia paresthetica, right lower limb: Secondary | ICD-10-CM | POA: Insufficient documentation

## 2014-10-19 DIAGNOSIS — Z6841 Body Mass Index (BMI) 40.0 and over, adult: Secondary | ICD-10-CM | POA: Insufficient documentation

## 2014-10-19 DIAGNOSIS — R945 Abnormal results of liver function studies: Secondary | ICD-10-CM

## 2014-10-19 DIAGNOSIS — R1011 Right upper quadrant pain: Secondary | ICD-10-CM | POA: Diagnosis present

## 2014-10-19 HISTORY — DX: Abnormal levels of other serum enzymes: R74.8

## 2014-10-19 HISTORY — PX: ERCP: SHX5425

## 2014-10-19 SURGERY — ERCP, WITH INTERVENTION IF INDICATED
Anesthesia: General

## 2014-10-19 MED ORDER — DEXAMETHASONE SODIUM PHOSPHATE 10 MG/ML IJ SOLN
INTRAMUSCULAR | Status: AC
  Filled 2014-10-19: qty 1

## 2014-10-19 MED ORDER — FENTANYL CITRATE (PF) 100 MCG/2ML IJ SOLN
INTRAMUSCULAR | Status: DC | PRN
  Administered 2014-10-19: 100 ug via INTRAVENOUS

## 2014-10-19 MED ORDER — CIPROFLOXACIN HCL 500 MG PO TABS: 500.0000 mg | ORAL_TABLET | Freq: Two times a day (BID) | ORAL | Status: DC

## 2014-10-19 MED ORDER — FENTANYL CITRATE (PF) 100 MCG/2ML IJ SOLN
INTRAMUSCULAR | Status: AC
  Filled 2014-10-19: qty 2

## 2014-10-19 MED ORDER — GLUCAGON HCL RDNA (DIAGNOSTIC) 1 MG IJ SOLR
INTRAMUSCULAR | Status: AC
  Filled 2014-10-19: qty 2

## 2014-10-19 MED ORDER — PROPOFOL 10 MG/ML IV BOLUS
INTRAVENOUS | Status: AC
  Filled 2014-10-19: qty 20

## 2014-10-19 MED ORDER — LACTATED RINGERS IV SOLN
INTRAVENOUS | Status: DC
  Administered 2014-10-19: 1000 mL via INTRAVENOUS

## 2014-10-19 MED ORDER — DEXAMETHASONE SODIUM PHOSPHATE 10 MG/ML IJ SOLN
INTRAMUSCULAR | Status: DC | PRN
  Administered 2014-10-19: 10 mg via INTRAVENOUS

## 2014-10-19 MED ORDER — INDOMETHACIN 50 MG RE SUPP: 100.0000 mg | Freq: Once | RECTAL | Status: DC

## 2014-10-19 MED ORDER — IOHEXOL 350 MG/ML SOLN
INTRAVENOUS | Status: DC | PRN
  Administered 2014-10-19: 40 mL

## 2014-10-19 MED ORDER — GLUCAGON HCL RDNA (DIAGNOSTIC) 1 MG IJ SOLR
INTRAMUSCULAR | Status: DC | PRN
  Administered 2014-10-19: 0.25 mg via INTRAVENOUS

## 2014-10-19 MED ORDER — CIPROFLOXACIN IN D5W 400 MG/200ML IV SOLN
INTRAVENOUS | Status: AC
  Filled 2014-10-19: qty 200

## 2014-10-19 MED ORDER — PROPOFOL 10 MG/ML IV BOLUS
INTRAVENOUS | Status: DC | PRN
  Administered 2014-10-19: 150 mg via INTRAVENOUS

## 2014-10-19 MED ORDER — ONDANSETRON HCL 4 MG/2ML IJ SOLN
INTRAMUSCULAR | Status: DC | PRN
  Administered 2014-10-19: 4 mg via INTRAVENOUS

## 2014-10-19 MED ORDER — SUCCINYLCHOLINE CHLORIDE 20 MG/ML IJ SOLN
INTRAMUSCULAR | Status: DC | PRN
  Administered 2014-10-19: 100 mg via INTRAVENOUS

## 2014-10-19 MED ORDER — CIPROFLOXACIN IN D5W 400 MG/200ML IV SOLN
400.0000 mg | Freq: Once | INTRAVENOUS | Status: AC
  Administered 2014-10-19: 400 mg via INTRAVENOUS

## 2014-10-19 MED ORDER — SODIUM CHLORIDE 0.9 % IV SOLN: INTRAVENOUS | Status: DC

## 2014-10-19 MED ORDER — ONDANSETRON HCL 4 MG/2ML IJ SOLN
INTRAMUSCULAR | Status: AC
  Filled 2014-10-19: qty 2

## 2014-10-19 NOTE — Discharge Instructions (Signed)
Endoscopic Retrograde Cholangiopancreatography (ERCP), Care After °Refer to this sheet in the next few weeks. These instructions provide you with information on caring for yourself after your procedure. Your health care provider may also give you more specific instructions. Your treatment has been planned according to current medical practices, but problems sometimes occur. Call your health care provider if you have any problems or questions after your procedure.  °WHAT TO EXPECT AFTER THE PROCEDURE  °After your procedure, it is typical to feel:  °· Soreness in your throat.   °· Sick to your stomach (nauseous).   °· Bloated. °· Dizzy.   °· Fatigued. °HOME CARE INSTRUCTIONS °· Have a friend or family member stay with you for the first 24 hours after your procedure. °· Start taking your usual medicines and eating normally as soon as you feel well enough to do so or as directed by your health care provider. °SEEK MEDICAL CARE IF: °· You have abdominal pain.   °· You develop signs of infection, such as:   °¨ Chills.   °¨ Feeling unwell.   °SEEK IMMEDIATE MEDICAL CARE IF: °· You have difficulty swallowing. °· You have worsening throat, chest, or abdominal pain. °· You vomit. °· You have bloody or very black stools. °· You have a fever. °Document Released: 01/01/2013 Document Reviewed: 01/01/2013 °Exit[employer]® Patient Information ©2015 [employer]. This information is not intended to replace advice given to you by your health care provider. Make sure you discuss any questions you have with your health care provider. ° °

## 2014-10-19 NOTE — Anesthesia Procedure Notes (Signed)
Procedure Name: Intubation Date/Time: 10/19/2014 11:10 AM Performed by: Lajuana Carry E Pre-anesthesia Checklist: Patient identified, Emergency Drugs available, Suction available and Patient being monitored Patient Re-evaluated:Patient Re-evaluated prior to inductionOxygen Delivery Method: Circle System Utilized Preoxygenation: Pre-oxygenation with 100% oxygen Intubation Type: IV induction Ventilation: Mask ventilation without difficulty Laryngoscope Size: Miller and 3 Grade View: Grade II Tube type: Oral Tube size: 7.5 mm Number of attempts: 1 Airway Equipment and Method: Stylet Placement Confirmation: ETT inserted through vocal cords under direct vision,  positive ETCO2 and breath sounds checked- equal and bilateral Secured at: 22 cm Tube secured with: Tape Dental Injury: Teeth and Oropharynx as per pre-operative assessment

## 2014-10-19 NOTE — Op Note (Signed)
[facility] [place], [id number]   ERCP PROCEDURE REPORT        EXAM DATE: 10/19/2014  PATIENT NAME:          Heather Frederick, Heather Frederick          MR #: [id number] BIRTHDATE:       16-Dec-1955     VISIT #:     [phone] ATTENDING:     Ladene Artist, MD, [facility] Regal     STATUS:     outpatient  ASSISTANT:      Sharon Mt and Sheppard Plumber, Autumn   INDICATIONS:  The patient is a 59 yr old female here for an ERCP due to abnormal liver function test and abdominal pain in upper right quadrant.  Suspected CBD stone(s). PROCEDURE PERFORMED:     ERCP with sphincterotomy/papillotomy ERCP with removal of calculus/calculi MEDICATIONS:     Per Anesthesia  CONSENT: The patient understands the risks and benefits of the procedure and understands that these risks include, but are not limited to: sedation, allergic reaction, infection, perforation and/or bleeding. Alternative means of evaluation and treatment include, among others: physical exam, x-rays, and/or surgical intervention. The patient elects to proceed with this endoscopic procedure.  DESCRIPTION OF PROCEDURE: During intra-op preparation period all mechanical & medical equipment was checked for proper function. Hand hygiene and appropriate measures for infection prevention was taken. After the risks, benefits and alternatives of the procedure were thoroughly explained, Informed was verified, confirmed and timeout was successfully executed by the treatment team. With the patient in left semi-prone position, medications were administered intravenously.The    was passed from the mouth into the esophagus and further advanced from the esophagus into the stomach. From stomach scope was directed to the second portion of the duodenum. Major papilla was aligned with the duodenoscope. The scope position was confirmed fluoroscopically. Rest of the findings/therapeutics are given below. The scope was then completely  withdrawn from the patient and the procedure completed. The pulse, BP, and O2 saturation were monitored and documented by the physician and the nursing staff throughout the entire procedure. The patient was cared for as planned according to standard protocol. The patient was then discharged to recovery in stable condition and with appropriate post procedure care. Estimated blood loss is zero unless otherwise noted in this procedure report.  There was a small periampullary diverticulum.  After free CBD cannulation a guidewire was advanced into the biliary tree and contrast injected. A single stone was seen in the distal common bile duct.  With guidewire in the bile duct, a biliary sphincterotomy was performed using the sphincterotome. Pus was draining from sphinctertomy site.  Using a stone extraction balloon the bile duct was swept twice.  A single stone  was removed from the bile duct successfully along with pus. The biliary tree otherwise was unremarkable post cholecystectomy. There was excellent biliary drainage noted. The PD was not injected or cannulated by intention.    ADVERSE EVENT:     There were no complications. IMPRESSIONS:     1.  Small periampullary diverticulum 2.  A single stone was removed from the bile duct; sphincterotomy and balloon extraction 3.  Cholangitis  RECOMMENDATIONS:     1.  Antibiotics: cipron 500 mg po bid for 7 days 2.  Liver enzymes   Ladene Artist, MD, [facility] eSigned:  Ladene Artist, MD, [facility] 10/19/2014 11:57 AM   cc: Jackolyn Confer, M.D.   [I     PATIENT NAME:  Heather, Frederick MR#: [id number]

## 2014-10-19 NOTE — H&P (View-Only) (Signed)
Patient ID: Heather Frederick, female   DOB: 27-Dec-1955, 59 y.o.   MRN: [id number]   Subjective:    Patient ID: Heather Frederick, female    DOB: Jun 01, 1955, 59 y.o.   MRN: [id number]  HPI Heather Frederick is a 59 year old white female known to Dr. Fuller Plan prior history of GERD. She was last seen in the office in June with globus sensation and I was increased to twice daily. Patient comes in today after an ER visit on 10/02/2014. She had undergone a laparoscopic cholecystectomy in March 2016 for symptomatic cholelithiasis and at that time was noted to have mildly elevated transaminases. IOC was unsuccessful. Patient says that within a month of having her gallbladder out she started having recurrent episodes of epigastric pain. Says some of these are mild and dissipate. Quickly and others have been severe. Over the past couple of months she is having at least one severe episode every week and says these are intense and may last for hours. She feels pain high in the epigastrium sometimes radiating into her chest and her back. She will have some nausea usually no vomiting. She says she has also had some chills with these episodes. She decided describes the pain as a severe squeezing type pain. She went to the emergency room on July 8 because of a severe episode and was noted to have a bilirubin of 3.8 alkaline phosphatase 357 ,AST of 95, and ALT of 134. ,Upper abdominal ultrasound was done showing her to be status post cholecystectomy however there was limited view of the common bile duct due to her body habitus and she was noted to have steatosis. She was given analgesics and antibiotics. She says she's not had any severe episodes since then  Review of Systems Pertinent positive and negative review of systems were noted in the above HPI section.  All other review of systems was otherwise negative.  Outpatient Encounter Prescriptions as of 10/07/2014  Medication Sig  . Alum & Mag Hydroxide-Simeth (MAGIC MOUTHWASH  W/LIDOCAINE) SOLN Take 5 mLs by mouth 3 (three) times daily as needed for mouth pain.  Marland Kitchen aspirin 81 MG tablet Take 81 mg by mouth every morning.   . Cholecalciferol (VITAMIN D-3) 1000 UNITS CAPS Take 2,000 Units by mouth daily.   . citalopram (CELEXA) 20 MG tablet Take 1 tablet (20 mg total) by mouth daily.  Marland Kitchen CRANBERRY-VITAMIN C PO Take 4,200 mg by mouth daily.  . diclofenac sodium (VOLTAREN) 1 % GEL Apply 1 application topically 2 (two) times daily as needed (Pain in hip and left elbow).  . diphenhydrAMINE (BENADRYL) 25 mg capsule Take 50 mg by mouth every 6 (six) hours as needed for itching.  . docusate sodium (COLACE) 100 MG capsule Take 100 mg by mouth 2 (two) times daily as needed for mild constipation.  Marland Kitchen eletriptan (RELPAX) 40 MG tablet Take 1 tablet (40 mg total) by mouth every 2 (two) hours as needed for migraine.  Marland Kitchen loratadine (CLARITIN) 10 MG tablet Take 20 mg by mouth 2 (two) times daily as needed for allergies.   . Lutein-Zeaxanthin 25-5 MG CAPS Take 1 capsule by mouth daily.  . Methylcobalamin (METHYL B-12 PO) Take 2,500 mcg by mouth daily.  . metoprolol succinate (TOPROL-XL) 25 MG 24 hr tablet Take 25 mg by mouth daily.  . Multiple Vitamin (MULTIVITAMIN WITH MINERALS) TABS Take 1 tablet by mouth daily.  . nitroGLYCERIN (NITROSTAT) 0.4 MG SL tablet Place 1 tablet (0.4 mg total) under the tongue every 5 (five) minutes  as needed. (Patient taking differently: Place 0.4 mg under the tongue every 5 (five) minutes as needed for chest pain. )  . Omega-3 Fatty Acids (FISH OIL PO) Take 1,200 mg by mouth daily.  . ondansetron (ZOFRAN ODT) 4 MG disintegrating tablet 4mg  ODT q4 hours prn nausea/vomit  . oxybutynin (DITROPAN-XL) 10 MG 24 hr tablet Take 10 mg by mouth daily.  Marland Kitchen oxyCODONE (ROXICODONE) 5 MG immediate release tablet Take 1 tablet (5 mg total) by mouth every 4 (four) hours as needed for severe pain.  . pantoprazole (PROTONIX) 40 MG tablet Take 1 tablet (40 mg total) by mouth 2 (two)  times daily.  . pregabalin (LYRICA) 200 MG capsule TAKE ONE (1) CAPSULE BY MOUTH EACH EVENING  . pregabalin (LYRICA) 200 MG capsule Take 200 mg by mouth every morning.  . ranitidine (ZANTAC) 300 MG capsule Take 1 capsule (300 mg total) by mouth every evening.  . simvastatin (ZOCOR) 20 MG tablet Take 1 tablet (20 mg total) by mouth daily.  Marland Kitchen spironolactone (ALDACTONE) 25 MG tablet Take 25 mg by mouth daily.   No facility-administered encounter medications on file as of 10/07/2014.   Allergies  Allergen Reactions  . Topamax [Topiramate] Other (See Comments)    :Stroke like symptoms  . Aleve [Naproxen Sodium] Hives    Has tolerated Voltaren topical as well as aspirin.  . Bee Venom Swelling  . Echinacea Hives  . Other Other (See Comments)    Feathers cause sinus congestion  . Sulfa Antibiotics Hives  . Advil [Ibuprofen] Hives    Has tolerated Voltaren topical as well as aspirin.   Patient Active Problem List   Diagnosis Date Noted  . Benign essential HTN 09/23/2014  . Routine general medical examination at a health care facility 08/01/2014  . OSA (obstructive sleep apnea) 07/04/2014  . Fullness of neck 03/25/2014  . Dyslipidemia 03/09/2014  . Diastolic heart failure, stage B   . GERD (gastroesophageal reflux disease)   . Meralgia paresthetica of right side 10/02/2012  . Osteoarthritis of left knee 09/12/2012  . Migraines 09/12/2012  . Severe obesity (BMI >= 40) 09/12/2012   History   Social History  . Marital Status: Married    Spouse Name: Remo Lipps  . Number of Children: 2  . Years of Education: 16   Occupational History  . [employer]    Social History Main Topics  . Smoking status: Never Smoker   . Smokeless tobacco: Never Used     Comment: Secondhand - from family growing up, workplace intermittently  . Alcohol Use: 0.0 oz/week    0 Standard drinks or equivalent per week     Comment: occasionally - intermittent, no more than twice a week  . Drug Use: No  . Sexual  Activity: Not on file   Other Topics Concern  . Not on file   Social History Narrative   Patient is married Remo Lipps) and lives at home with her husband and child.   Patient has two children.   Patient has a college education.   Patient is right-handed.   Patient drinks 1-2 cup of coffee/tea daily.    Heather Frederick family history includes Breast cancer in her cousin and other; Clotting disorder in her father; Colon cancer in her paternal grandmother; Coronary artery disease in her father; Hypertension in her brother, father, and mother; Kidney failure in her brother; Liver disease in her mother; Migraines in her brother, daughter, and father; Pancreatic cancer in her paternal grandmother; Stomach cancer in her  paternal grandmother; Stroke in her mother. There is no history of Colon polyps or Esophageal cancer.      Objective:    Filed Vitals:   10/07/14 1501  BP: 102/62  Pulse: 64    Physical Exam  well-developed obese white female in no acute distress, quite pleasant blood pressure 102/62 pulse 64 height 5 foot 5 weight 290, BMI 52 ,HEENT; nontraumatic normocephalic EOMI PERRLA sclera anicteric, Supple, Cardiovascular; regular rate and rhythm with S1-S2 no murmur or gallop, Pulmonary; clear bilaterally, Abdomen; morbidly obese soft nontender no palpable mass or hepatosplenomegaly, bowel sounds are present, Rectal; exam not done, Extremities ;no clubbing cyanosis or edema no obvious icterus, Neuropsych ;mood and affect appropriate       Assessment & Plan:   #1 59  Yo female s/p lap chole 05/2014 with recurrent intense episodes of epigastric pain and nausea , now occurring weekly. Most recent episode with documented elevation and  LFT's and tbili 3.8  She very likely has choledocholithiasis. #2 morbid obesity #3 GERD #4 OSA #5 HTN  Plan; Repeat labs today  Discussed in detail  with Dr. Fuller Plan, and we will proceed directly to ERCP with plan for stone extraction  Pt has Zofran and  hydrocodone at home to use prn. She is advised to seek ER care for any further severe prolonged episodes in the interim. - if this happens before ERCP is done she will require admission to hospital for more emergent ERCP. Pt is aware of plan. ERCP is reviewed in detail with pt including 5% risk of pancreatitis and she agrees to proceed.   Amy Genia Harold PA-C 10/07/2014   Cc: Olga Millers, MD

## 2014-10-19 NOTE — Telephone Encounter (Signed)
Prescription sent to [facility] pharmacy.

## 2014-10-19 NOTE — Transfer of Care (Signed)
Immediate Anesthesia Transfer of Care Note  Patient: Heather Frederick  Procedure(s) Performed: Procedure(s): ENDOSCOPIC RETROGRADE CHOLANGIOPANCREATOGRAPHY (ERCP) (N/A)  Patient Location: PACU  Anesthesia Type:General  Level of Consciousness: awake, alert  and oriented  Airway & Oxygen Therapy: Patient Spontanous Breathing and Patient connected to nasal cannula oxygen  Post-op Assessment: Report given to RN and Post -op Vital signs reviewed and stable  Post vital signs: Reviewed and stable  Last Vitals: There were no vitals filed for this visit.  Complications: No apparent anesthesia complications

## 2014-10-19 NOTE — Anesthesia Preprocedure Evaluation (Signed)
Anesthesia Evaluation  Patient identified by MRN, date of birth, ID band Patient awake    Reviewed: Allergy & Precautions, NPO status , Patient's Chart, lab work & pertinent test results  History of Anesthesia Complications Negative for: history of anesthetic complications  Airway Mallampati: III  TM Distance: >3 FB Neck ROM: Full    Dental  (+) Teeth Intact   Pulmonary sleep apnea and Continuous Positive Airway Pressure Ventilation ,  breath sounds clear to auscultation        Cardiovascular hypertension, Pt. on medications and Pt. on home beta blockers + Peripheral Vascular Disease - CAD and - CHF Rhythm:Regular     Neuro/Psych  Headaches, PSYCHIATRIC DISORDERS Anxiety Depression  Neuromuscular disease    GI/Hepatic Neg liver ROS, GERD-  Medicated,  Endo/Other  Morbid obesity  Renal/GU      Musculoskeletal  (+) Arthritis -,   Abdominal   Peds  Hematology   Anesthesia Other Findings   Reproductive/Obstetrics                             Anesthesia Physical Anesthesia Plan  ASA: III  Anesthesia Plan: General   Post-op Pain Management:    Induction: Intravenous, Rapid sequence and Cricoid pressure planned  Airway Management Planned: Oral ETT  Additional Equipment: None  Intra-op Plan:   Post-operative Plan: Extubation in OR  Informed Consent: I have reviewed the patients History and Physical, chart, labs and discussed the procedure including the risks, benefits and alternatives for the proposed anesthesia with the patient or authorized representative who has indicated his/her understanding and acceptance.   Dental advisory given  Plan Discussed with: CRNA and Surgeon  Anesthesia Plan Comments:         Anesthesia Quick Evaluation

## 2014-10-19 NOTE — Interval H&P Note (Signed)
History and Physical Interval Note:  10/19/2014 9:41 AM  Heather Frederick  has presented today for surgery, with the diagnosis of Elevated LFT's Probable CBD stones Epigastric pain  The various methods of treatment have been discussed with the patient and family. After consideration of risks, benefits and other options for treatment, the patient has consented to  Procedure(s): ENDOSCOPIC RETROGRADE CHOLANGIOPANCREATOGRAPHY (ERCP) (N/A) as a surgical intervention .  The patient's history has been reviewed, patient examined, no change in status, stable for surgery.  I have reviewed the patient's chart and labs.  Questions were answered to the patient's satisfaction.     Pricilla Riffle. [place] Plan

## 2014-10-19 NOTE — Telephone Encounter (Signed)
-----   Message from Ladene Artist, MD sent at 10/19/2014 12:13 PM EDT ----- Please send in Cipro 500 mg po bid #14 to [place]. She is in [facility] endo now post ERCP but is going home in about 1 hour and I can't find a way to order outpatient med until she is discharged

## 2014-10-20 NOTE — Anesthesia Postprocedure Evaluation (Signed)
  Anesthesia Post-op Note  Patient: Heather Frederick  Procedure(s) Performed: Procedure(s): ENDOSCOPIC RETROGRADE CHOLANGIOPANCREATOGRAPHY (ERCP) (N/A)  Patient Location: Endoscopy Unit  Anesthesia Type:General  Level of Consciousness: awake  Airway and Oxygen Therapy: Patient Spontanous Breathing  Post-op Pain: none  Post-op Assessment: Post-op Vital signs reviewed, Patient's Cardiovascular Status Stable, Respiratory Function Stable, Patent Airway, No signs of Nausea or vomiting and Pain level controlled              Post-op Vital Signs: Reviewed and stable  Last Vitals:  Filed Vitals:   10/19/14 1310  BP: 100/43  Pulse: 71  Temp:   Resp: 18    Complications: No apparent anesthesia complications

## 2014-10-21 ENCOUNTER — Encounter (HOSPITAL_COMMUNITY): Payer: Self-pay | Admitting: Gastroenterology

## 2014-11-09 ENCOUNTER — Ambulatory Visit (INDEPENDENT_AMBULATORY_CARE_PROVIDER_SITE_OTHER): Payer: 59 | Admitting: Gastroenterology

## 2014-11-09 ENCOUNTER — Encounter: Payer: Self-pay | Admitting: Gastroenterology

## 2014-11-09 ENCOUNTER — Other Ambulatory Visit: Payer: Self-pay | Admitting: Gastroenterology

## 2014-11-09 ENCOUNTER — Other Ambulatory Visit (INDEPENDENT_AMBULATORY_CARE_PROVIDER_SITE_OTHER): Payer: 59

## 2014-11-09 VITALS — BP 100/62 | HR 72 | Ht 63.25 in | Wt 302.4 lb

## 2014-11-09 DIAGNOSIS — R079 Chest pain, unspecified: Secondary | ICD-10-CM

## 2014-11-09 DIAGNOSIS — K8309 Other cholangitis: Secondary | ICD-10-CM

## 2014-11-09 DIAGNOSIS — K219 Gastro-esophageal reflux disease without esophagitis: Secondary | ICD-10-CM | POA: Diagnosis not present

## 2014-11-09 DIAGNOSIS — K8050 Calculus of bile duct without cholangitis or cholecystitis without obstruction: Secondary | ICD-10-CM

## 2014-11-09 DIAGNOSIS — K830 Cholangitis: Secondary | ICD-10-CM

## 2014-11-09 LAB — HEPATIC FUNCTION PANEL
ALK PHOS: 122 U/L — AB (ref 39–117)
ALT: 35 U/L (ref 0–35)
AST: 27 U/L (ref 0–37)
Albumin: 3.8 g/dL (ref 3.5–5.2)
BILIRUBIN DIRECT: 0.3 mg/dL (ref 0.0–0.3)
BILIRUBIN TOTAL: 0.8 mg/dL (ref 0.2–1.2)
Total Protein: 6.8 g/dL (ref 6.0–8.3)

## 2014-11-09 NOTE — Progress Notes (Signed)
    History of Present Illness: This is a 59 year old female returning for follow-up after ERCP with sphincterotomy and balloon stone extraction on 10/19/2014 for a single common bile duct stone and mild cholangitis. She was treated with a 7 day course of Cipro. All of her abdominal complaints have resolved. She now relates infrequent episodes of mild midsternal chest pain and occasional sensation of a lump in her throat.  Current Medications, Allergies, Past Medical History, Past Surgical History, Family History and Social History were reviewed in [employer] record.  Physical Exam: General: Well developed , well nourished, obese, no acute distress Head: Normocephalic and atraumatic Eyes:  sclerae anicteric, EOMI Ears: Normal auditory acuity Mouth: No deformity or lesions Lungs: Clear throughout to auscultation Heart: Regular rate and rhythm; no murmurs, rubs or bruits Abdomen: Soft, non tender and non distended. No masses, hepatosplenomegaly or hernias noted. Normal Bowel sounds Musculoskeletal: Symmetrical with no gross deformities  Pulses:  Normal pulses noted Extremities: No clubbing, cyanosis, edema or deformities noted Neurological: Alert oriented x 4, grossly nonfocal Psychological:  Alert and cooperative. Normal mood and affect  Assessment and Recommendations:  1. Choledocholithiasis and mild cholangitis. S/P ERCP/sphincterotomy. All symptoms resolved. Recheck LFTs today.  2. Intermittent mid chest pain and globus sensation. Presumed GERD symptoms. Had recent cardiac evaluation by Dr. Radford Pax and Dr. Tamala Julian. Continue pantoprazole 40 mg twice daily and ranitidine 40 mg at bedtime. Intensify antireflux measures and begin the use of 4 inch bed blocks. Weight loss program recommended. Used TUMS when necessary.  3. OSA.  4. Morbid obesity. Exercise and weight loss program supervised by her PCP.

## 2014-11-09 NOTE — Patient Instructions (Signed)
Your physician has requested that you go to the basement for the following lab work before leaving today:Hepatic function panel.  Patient advised to avoid spicy, acidic, citrus, chocolate, mints, fruit and fruit juices.  Limit the intake of caffeine, alcohol and Soda.  Don't exercise too soon after eating.  Don't lie down within 3-4 hours of eating.  Elevate the head of your bed.  Thank you for choosing me and [facility].  Pricilla Riffle. Dagoberto Ligas., MD., Marval Regal

## 2014-11-23 ENCOUNTER — Other Ambulatory Visit: Payer: Self-pay | Admitting: Interventional Cardiology

## 2014-12-02 ENCOUNTER — Ambulatory Visit: Payer: 59 | Admitting: Gastroenterology

## 2014-12-07 ENCOUNTER — Other Ambulatory Visit (INDEPENDENT_AMBULATORY_CARE_PROVIDER_SITE_OTHER): Payer: 59

## 2014-12-07 ENCOUNTER — Telehealth: Payer: Self-pay

## 2014-12-07 DIAGNOSIS — K8050 Calculus of bile duct without cholangitis or cholecystitis without obstruction: Secondary | ICD-10-CM

## 2014-12-07 DIAGNOSIS — K830 Cholangitis: Secondary | ICD-10-CM

## 2014-12-07 DIAGNOSIS — K8309 Other cholangitis: Secondary | ICD-10-CM

## 2014-12-07 LAB — HEPATIC FUNCTION PANEL
ALT: 27 U/L (ref 0–35)
AST: 21 U/L (ref 0–37)
Albumin: 4 g/dL (ref 3.5–5.2)
Alkaline Phosphatase: 81 U/L (ref 39–117)
BILIRUBIN TOTAL: 0.6 mg/dL (ref 0.2–1.2)
Bilirubin, Direct: 0.2 mg/dL (ref 0.0–0.3)
Total Protein: 7.1 g/dL (ref 6.0–8.3)

## 2014-12-07 NOTE — Telephone Encounter (Signed)
-----   Message from Marzella Schlein, [place] sent at 11/09/2014  2:03 PM EDT ----- Call patient to remind her she needs repeat LFT's one month from 11/09/14.

## 2014-12-07 NOTE — Telephone Encounter (Signed)
Called patient to remind her for repeat labs and patient states she will go by the lab this week.

## 2014-12-25 ENCOUNTER — Other Ambulatory Visit: Payer: Self-pay | Admitting: Orthopaedic Surgery

## 2014-12-25 ENCOUNTER — Other Ambulatory Visit: Payer: Self-pay | Admitting: Neurology

## 2014-12-25 DIAGNOSIS — M545 Low back pain: Secondary | ICD-10-CM

## 2014-12-28 ENCOUNTER — Ambulatory Visit
Admission: RE | Admit: 2014-12-28 | Discharge: 2014-12-28 | Disposition: A | Discharge status: Home / Self Care | Payer: 59 | Source: Ambulatory Visit | Attending: Orthopaedic Surgery | Admitting: Orthopaedic Surgery

## 2014-12-28 DIAGNOSIS — M545 Low back pain: Secondary | ICD-10-CM

## 2015-01-19 ENCOUNTER — Other Ambulatory Visit: Payer: Self-pay

## 2015-01-19 DIAGNOSIS — Z1231 Encounter for screening mammogram for malignant neoplasm of breast: Secondary | ICD-10-CM

## 2015-01-28 ENCOUNTER — Other Ambulatory Visit: Payer: 59

## 2015-01-28 ENCOUNTER — Encounter: Payer: Self-pay | Admitting: Internal Medicine

## 2015-01-28 ENCOUNTER — Ambulatory Visit (INDEPENDENT_AMBULATORY_CARE_PROVIDER_SITE_OTHER): Payer: 59 | Admitting: Internal Medicine

## 2015-01-28 VITALS — BP 108/68 | HR 61 | Temp 98.5°F | Resp 14 | Ht 65.0 in | Wt 293.0 lb

## 2015-01-28 DIAGNOSIS — Z23 Encounter for immunization: Secondary | ICD-10-CM | POA: Diagnosis not present

## 2015-01-28 DIAGNOSIS — Z0000 Encounter for general adult medical examination without abnormal findings: Secondary | ICD-10-CM

## 2015-01-28 DIAGNOSIS — I10 Essential (primary) hypertension: Secondary | ICD-10-CM | POA: Diagnosis not present

## 2015-01-28 DIAGNOSIS — F329 Major depressive disorder, single episode, unspecified: Secondary | ICD-10-CM

## 2015-01-28 DIAGNOSIS — F32A Depression, unspecified: Secondary | ICD-10-CM

## 2015-01-28 LAB — HIV ANTIBODY (ROUTINE TESTING W REFLEX): HIV 1&2 Ab, 4th Generation: NONREACTIVE

## 2015-01-28 LAB — HEPATITIS C ANTIBODY: HCV Ab: NEGATIVE

## 2015-01-28 NOTE — Assessment & Plan Note (Signed)
Depression is controlled with celexa and no SI/HI. No indication for change today.

## 2015-01-28 NOTE — Assessment & Plan Note (Signed)
BP well controlled on metoprolol and spironolactone. Reviewed recent BMP and no changes needed.

## 2015-01-28 NOTE — Progress Notes (Signed)
Pre visit review using our clinic review tool, if applicable. No additional management support is needed unless otherwise documented below in the visit note. 

## 2015-01-28 NOTE — Patient Instructions (Signed)
We will check the two blood tests screening for hepatitis C and HIV. We will call you back with the results.  Come back in about 6 months and keep working on exercising now that your back is feeling better.   We have given you the flu shot today.

## 2015-01-28 NOTE — Assessment & Plan Note (Signed)
Weight down a few pounds since last visit but has been increasing and decreasing during that time. She is not consciously trying to lose weight right now. She is going to start exercising now that her back is feeling better. Talked to her about the need for diet change as well to be more healthy.

## 2015-01-28 NOTE — Progress Notes (Signed)
   Subjective:    Patient ID: Heather Frederick, female    DOB: [birth year], 57 y.o.   MRN: [id number]  HPI The patient is a 59 YO female coming in for follow up of her medical conditions including: morbid obesity (down a couple of pounds, due to OA not able to exercise recently), her blood pressure (at goal today on her metoprolol and spironolactone) and her depression (still well controlled on celexa, no SI/HI, not exercising and not having as much stress). No new complaints.   Review of Systems  Constitutional: Negative for fever, chills, activity change, appetite change, fatigue and unexpected weight change.  Respiratory: Negative for cough, chest tightness, shortness of breath and wheezing.   Cardiovascular: Negative for chest pain, palpitations and leg swelling.  Gastrointestinal: Negative for abdominal pain, diarrhea, constipation and abdominal distention.  Musculoskeletal: Negative.   Skin: Negative.   Neurological: Negative.   Psychiatric/Behavioral: Negative.       Objective:   Physical Exam  Constitutional: She appears well-developed and well-nourished.  Overweight  HENT:  Head: Normocephalic and atraumatic.  Right Ear: External ear normal.  Left Ear: External ear normal.  Eyes: EOM are normal.  Neck: Normal range of motion.  Cardiovascular: Normal rate and regular rhythm.   Pulmonary/Chest: Effort normal and breath sounds normal.  Abdominal: Soft. Bowel sounds are normal.  Skin: Skin is warm and dry.   Filed Vitals:   01/28/15 0840  BP: 108/68  Pulse: 61  Temp: 98.5 F (36.9 C)  TempSrc: Oral  Resp: 14  Height: 5\' 5"  (1.651 m)  Weight: 293 lb (132.904 kg)  SpO2: 95%      Assessment & Plan:  Flu shot given at visit.

## 2015-02-24 ENCOUNTER — Ambulatory Visit (INDEPENDENT_AMBULATORY_CARE_PROVIDER_SITE_OTHER): Payer: 59 | Admitting: Nurse Practitioner

## 2015-02-24 ENCOUNTER — Encounter: Payer: Self-pay | Admitting: Nurse Practitioner

## 2015-02-24 ENCOUNTER — Other Ambulatory Visit: Payer: Self-pay | Admitting: Interventional Cardiology

## 2015-02-24 VITALS — BP 100/56 | HR 66 | Ht 64.0 in | Wt 305.2 lb

## 2015-02-24 DIAGNOSIS — G5711 Meralgia paresthetica, right lower limb: Secondary | ICD-10-CM

## 2015-02-24 DIAGNOSIS — G43009 Migraine without aura, not intractable, without status migrainosus: Secondary | ICD-10-CM | POA: Diagnosis not present

## 2015-02-24 MED ORDER — PREGABALIN 200 MG PO CAPS: ORAL_CAPSULE | ORAL | Status: DC

## 2015-02-24 MED ORDER — ELETRIPTAN HYDROBROMIDE 40 MG PO TABS: 40.0000 mg | ORAL_TABLET | ORAL | Status: DC | PRN

## 2015-02-24 NOTE — Progress Notes (Signed)
I have read the note, and I agree with the clinical assessment and plan.  Heather Frederick,Heather Frederick   

## 2015-02-24 NOTE — Progress Notes (Signed)
[facility]  PATIENT: Heather Frederick DOB: 09/15/1955   REASON FOR VISIT: follow up for Migraine, meralgia paresthetica right side HISTORY FROM: Patient    HISTORY OF PRESENT ILLNESS:Heather Frederick is a 59 year old right-handed white female with a history of morbid obesity and a history of migraine headache. She also has a history of obstructive sleep apnea.  and is on CPAP 18 cm at night. The patient has done well with her migraine since last seen by Dr. Jannifer Franklin 06/24/2014. She is  retired from her job. The patient is having approximately one headache a month at this time or less.  The patient is  on Toprol, which she is currently taking. The patient is on Relpax if needed for the headache. The patient indicates that if she can get to the headache quickly, the headache will only last about 15-20 minutes with the Relpax.  She is on Lyrica for her meralgia paresthetica with control of her pain.  She returns for an evaluation.   REVIEW OF SYSTEMS: Full 14 system review of systems performed and notable only for those listed, all others are neg:  Constitutional: neg  Cardiovascular: neg Ear/Nose/Throat: neg  Skin: neg Eyes: neg Respiratory: neg Gastroitestinal: neg  Hematology/Lymphatic: neg  Endocrine: neg Musculoskeletal:nDegenerative disc disease ALLERGIES/Immunology: neg Neurological: history of headaches in good control Psychiatric:  depression  Sleep :  obstructive sleep apnea with CPAP    ALLERGIES: Allergies  Allergen Reactions  . Topamax [Topiramate] Other (See Comments)    :Stroke like symptoms  . Aleve [Naproxen Sodium] Hives    Has tolerated Voltaren topical as well as aspirin.  . Bee Venom Swelling  . Echinacea Hives  . Other Other (See Comments)    Feathers cause sinus congestion  . Sulfa Antibiotics Hives  . Advil [Ibuprofen] Hives    Has tolerated Voltaren topical as well as aspirin.    HOME MEDICATIONS: Outpatient Prescriptions Prior to Visit   Medication Sig Dispense Refill  . amoxicillin-clavulanate (AUGMENTIN) 500-125 MG tablet Take 500 mg by mouth 2 (two) times daily.     . Cholecalciferol (VITAMIN D-3) 1000 UNITS CAPS Take 2,000 Units by mouth every morning.     . citalopram (CELEXA) 20 MG tablet Take 1 tablet (20 mg total) by mouth daily. 30 tablet 5  . CRANBERRY-VITAMIN C PO Take 4,200 mg by mouth every morning.     . diclofenac sodium (VOLTAREN) 1 % GEL Apply 1 application topically 2 (two) times daily as needed (Pain in hip and left elbow).    . eletriptan (RELPAX) 40 MG tablet Take 1 tablet (40 mg total) by mouth every 2 (two) hours as needed for migraine. 8 tablet 5  . loratadine (CLARITIN) 10 MG tablet Take 20 mg by mouth 2 (two) times daily as needed for allergies.     . Lutein-Zeaxanthin 25-5 MG CAPS Take 1 capsule by mouth every morning.     Marland Kitchen LYRICA 200 MG capsule TAKE ONE (1) CAPSULE BY MOUTH EACH EVENING 30 capsule 1  . Methylcobalamin (METHYL B-12 PO) Take 2,500 mcg by mouth every morning.     . metoprolol succinate (TOPROL-XL) 25 MG 24 hr tablet Take 1 tablet (25 mg total) by mouth daily. 30 tablet 3  . Multiple Vitamin (MULTIVITAMIN WITH MINERALS) TABS Take 1 tablet by mouth at bedtime.     . nitroGLYCERIN (NITROSTAT) 0.4 MG SL tablet Place 1 tablet (0.4 mg total) under the tongue every 5 (five) minutes as needed. (Patient taking differently: Place  0.4 mg under the tongue every 5 (five) minutes as needed for chest pain. ) 25 tablet 3  . Omega-3 Fatty Acids (FISH OIL PO) Take 1,200 mg by mouth every morning.     Marland Kitchen oxybutynin (DITROPAN-XL) 10 MG 24 hr tablet Take 10 mg by mouth every morning.     . pantoprazole (PROTONIX) 40 MG tablet Take 1 tablet (40 mg total) by mouth 2 (two) times daily. 60 tablet 11  . ranitidine (ZANTAC) 300 MG capsule Take 1 capsule (300 mg total) by mouth every evening. 30 capsule 11  . simvastatin (ZOCOR) 20 MG tablet Take 1 tablet (20 mg total) by mouth daily. 30 tablet 5  . spironolactone  (ALDACTONE) 25 MG tablet Take 25 mg by mouth every morning.      No facility-administered medications prior to visit.    PAST MEDICAL HISTORY: Past Medical History  Diagnosis Date  . Anxiety   . Mental disorder   . GERD (gastroesophageal reflux disease)   . Arthritis   . Hard of hearing   . Migraine headache   . Obesity   . Depression   . Lateral epicondylitis of right elbow   . Dyslipidemia   . History of frequent urinary tract infections   . Osteoarthritis   . Gallstones     a. Seen on CT 01/2014.  Marland Kitchen Urinary tract infection   . Hypertension   . Raynaud disease     in feet per patient   . Meralgia paresthetica of right side 10/02/2012    slight at 05/2014  . Hepatic steatosis   . Benign essential HTN 09/23/2014  . Elevated liver enzymes   . Family history of anesthesia complication     father has a severe hard time waking up  . OSA (obstructive sleep apnea)     severe with AHI 37/hr now on CPAP at 18cm H2O    PAST SURGICAL HISTORY: Past Surgical History  Procedure Laterality Date  . Joint replacement    . Knee arthroscopy Left 12/12  . Carpel tunnel rel Right 4/12  . Rotator cuff repair Left 6/11  . Plantar fascia release Right 12/10  . Bladder suspension  6/10  . Carpel tunnel left/right  7/08, 8/08 Bilateral 8/08 and 7/08  . Knee arthroscopy Right 12/06  . Tennis elbow release Right 7/04  . Abdominal hysterectomy  1/04    partial  . Total knee arthroplasty Left 09/10/2012    Procedure: TOTAL KNEE ARTHROPLASTY- left;  Surgeon: Garald Balding, MD;  Location: [facility];  Service: Orthopedics;  Laterality: Left;  Left total knee arthroplasty  . Left heart catheterization with coronary angiogram N/A 03/10/2014    Procedure: LEFT HEART CATHETERIZATION WITH CORONARY ANGIOGRAM;  Surgeon: Sinclair Grooms, MD;  Location: [facility] CATH LAB;  Service: Cardiovascular;  Laterality: N/A;  . Cardiac catheterization    . Colonoscopy    . Radial tunnel release      right arm   .  Cholecystectomy N/A 06/11/2014    Procedure: LAPAROSCOPIC CHOLECYSTECTOMY WITH ATTEMPTED INTRAOPERATIVE CHOLANGIOGRAM;  Surgeon: Jackolyn Confer, MD;  Location: [facility];  Service: General;  Laterality: N/A;  . Ercp N/A 10/19/2014    Procedure: ENDOSCOPIC RETROGRADE CHOLANGIOPANCREATOGRAPHY (ERCP);  Surgeon: Ladene Artist, MD;  Location: [facility];  Service: Endoscopy;  Laterality: N/A;    FAMILY HISTORY: Family History  Problem Relation Age of Onset  . Hypertension Mother   . Stroke Mother   . Liver disease Mother  Abcess  . Hypertension Father   . Coronary artery disease Father   . Migraines Father   . Clotting disorder Father   . Kidney failure Brother   . Hypertension Brother   . Migraines Brother   . Breast cancer Other     Niece with breast cancer  . Migraines Daughter   . Colon cancer Paternal Grandmother   . Pancreatic cancer Paternal Grandmother   . Stomach cancer Paternal Grandmother   . Colon polyps Neg Hx   . Esophageal cancer Neg Hx   . Breast cancer Cousin     SOCIAL HISTORY: Social History   Social History  . Marital Status: Married    Spouse Name: Remo Lipps  . Number of Children: 2  . Years of Education: 16   Occupational History  . [employer]    Social History Main Topics  . Smoking status: Never Smoker   . Smokeless tobacco: Never Used     Comment: Secondhand - from family growing up, workplace intermittently  . Alcohol Use: 0.0 oz/week    0 Standard drinks or equivalent per week     Comment: occasionally - intermittent, no more than twice a week  . Drug Use: No  . Sexual Activity: Not on file   Other Topics Concern  . Not on file   Social History Narrative   Patient is married Remo Lipps) and lives at home with her husband and child.   Patient has two children.   Patient has a college education.   Patient is right-handed.   Patient drinks 1-2 cup of coffee/tea daily.     PHYSICAL EXAM  Filed Vitals:   02/24/15 0853  BP: 100/56    Pulse: 66  Height: 5\' 4"  (1.626 m)  Weight: 305 lb 3.2 oz (138.438 kg)   Body mass index is 52.36 kg/(m^2). General: The patient is alert and cooperative at the time of the examination. The patient is markedly obese. Skin: No significant peripheral edema is noted.  Neurologic Exam Mental status: The patient is alert and oriented x 3 at the time of the examination. The patient has apparent normal recent and remote memory, with an apparently normal attention span and concentration ability. Cranial nerves: Facial symmetry is present. Speech is normal, no aphasia or dysarthria is noted. Extraocular movements are full. Visual fields are full. Motor: The patient has good strength in all 4 extremities. Sensory examination: Soft touch sensation is symmetric on the face, arms, and legs. Coordination: The patient has good finger-nose-finger and heel-to-shin bilaterally. Gait and station: The patient has a normal gait. Tandem gait is normal. Romberg is negative. No drift is seen. Reflexes: Deep tendon reflexes are symmetric.    DIAGNOSTIC DATA (LABS, IMAGING, TESTING) - I reviewed patient records, labs, notes, testing and imaging myself where available.  Lab Results  Component Value Date   WBC 7.4 10/07/2014   HGB 13.8 10/07/2014   HCT 40.8 10/07/2014   MCV 88.8 10/07/2014   PLT 255.0 10/07/2014      Component Value Date/Time   NA 138 09/30/2014 2104   K 4.1 09/30/2014 2104   CL 102 09/30/2014 2104   CO2 27 09/30/2014 2104   GLUCOSE 120* 09/30/2014 2104   BUN 12 09/30/2014 2104   CREATININE 0.88 09/30/2014 2104   CALCIUM 8.9 09/30/2014 2104   PROT 7.1 12/07/2014 1417   ALBUMIN 4.0 12/07/2014 1417   AST 21 12/07/2014 1417   ALT 27 12/07/2014 1417   ALKPHOS 81 12/07/2014 1417   BILITOT  0.6 12/07/2014 1417   GFRNONAA >60 09/30/2014 2104   GFRAA >60 09/30/2014 2104     ASSESSMENT AND PLAN  59 y.o. year old female  has a past medical history of Anxiety;  Arthritis;  Migraine  headache; Obesity; Depression;  Meralgia paresthetica of right side (10/02/2012); Hepatic steatosis; Benign essential HTN (09/23/2014);  and OSA (obstructive sleep apnea). Here to follow-up for her migraines and meralgia paresthetica.  Continue Lyrica 200 mg daily will refill Continue Relpax acutely will refill given co pay card Continue Toprol daily Call for increase in headaches/migraines Follow-up in 8 to 9 months Dennie Bible, [facility], [facility], [facility] [address], [place], [place] [address] [phone]

## 2015-02-24 NOTE — Patient Instructions (Signed)
Continue Lyrica 200 mg daily will refill Continue Relpax acutely will refill given co pay card Continue Toprol daily Call for increase in headaches/migraines Follow-up in 8 to 9 months

## 2015-02-25 ENCOUNTER — Telehealth: Payer: Self-pay | Admitting: Interventional Cardiology

## 2015-02-25 MED ORDER — SPIRONOLACTONE 25 MG PO TABS: 25.0000 mg | ORAL_TABLET | Freq: Every morning | ORAL | Status: DC

## 2015-02-25 NOTE — Telephone Encounter (Signed)
°*  STAT* If patient is at the pharmacy, call can be transferred to refill team.   1. Which medications need to be refilled? (please list name of each medication and dose if known) SPIRONOLACTONE 25MG   2. Which pharmacy/location (including street and city if local pharmacy) is medication to be sent to? [place] [facility]   3. Do they need a 30 day or 90 day supply? [place]

## 2015-03-02 ENCOUNTER — Ambulatory Visit
Admission: RE | Admit: 2015-03-02 | Discharge: 2015-03-02 | Disposition: A | Discharge status: Home / Self Care | Payer: 59 | Source: Ambulatory Visit

## 2015-03-02 DIAGNOSIS — Z1231 Encounter for screening mammogram for malignant neoplasm of breast: Secondary | ICD-10-CM

## 2015-03-26 ENCOUNTER — Other Ambulatory Visit: Payer: Self-pay | Admitting: Interventional Cardiology

## 2015-03-26 ENCOUNTER — Other Ambulatory Visit: Payer: Self-pay

## 2015-03-26 MED ORDER — METOPROLOL SUCCINATE ER 25 MG PO TB24: 25.0000 mg | ORAL_TABLET | Freq: Every day | ORAL | Status: DC

## 2015-05-24 ENCOUNTER — Other Ambulatory Visit: Payer: Self-pay | Admitting: Internal Medicine

## 2015-05-25 ENCOUNTER — Other Ambulatory Visit: Payer: Self-pay | Admitting: Gastroenterology

## 2015-06-08 ENCOUNTER — Ambulatory Visit (INDEPENDENT_AMBULATORY_CARE_PROVIDER_SITE_OTHER): Payer: 59 | Admitting: Interventional Cardiology

## 2015-06-08 ENCOUNTER — Encounter: Payer: Self-pay | Admitting: Interventional Cardiology

## 2015-06-08 VITALS — BP 102/74 | HR 67 | Ht 65.0 in | Wt 307.1 lb

## 2015-06-08 DIAGNOSIS — G4733 Obstructive sleep apnea (adult) (pediatric): Secondary | ICD-10-CM | POA: Diagnosis not present

## 2015-06-08 DIAGNOSIS — I519 Heart disease, unspecified: Secondary | ICD-10-CM | POA: Diagnosis not present

## 2015-06-08 DIAGNOSIS — I10 Essential (primary) hypertension: Secondary | ICD-10-CM

## 2015-06-08 DIAGNOSIS — I209 Angina pectoris, unspecified: Secondary | ICD-10-CM

## 2015-06-08 DIAGNOSIS — I5030 Unspecified diastolic (congestive) heart failure: Secondary | ICD-10-CM

## 2015-06-08 NOTE — Progress Notes (Signed)
Cardiology Office Note   Date:  06/08/2015   ID:  Heather Frederick, DOB [birth year], MRN [id number]  PCP:  Hoyt Koch, MD  Cardiologist:  Sinclair Grooms, MD   Chief Complaint  Patient presents with  . Hypertension      History of Present Illness: Heather Frederick is a 60 y.o. female who presents for Follow-up of obesity, diastolic heart failure, obstructive sleep apnea, and noncardiac chest pain.  Having dyspnea on exertion. Sleeping is still difficult with C Pap. Chest discomfort has resolved. She has what she considers to be reflux type symptoms.    Past Medical History  Diagnosis Date  . Anxiety   . Mental disorder   . GERD (gastroesophageal reflux disease)   . Arthritis   . Hard of hearing   . Migraine headache   . Obesity   . Depression   . Lateral epicondylitis of right elbow   . Dyslipidemia   . History of frequent urinary tract infections   . Osteoarthritis   . Gallstones     a. Seen on CT 01/2014.  Marland Kitchen Urinary tract infection   . Hypertension   . Raynaud disease     in feet per patient   . Meralgia paresthetica of right side 10/02/2012    slight at 05/2014  . Hepatic steatosis   . Benign essential HTN 09/23/2014  . Elevated liver enzymes   . Family history of anesthesia complication     father has a severe hard time waking up  . OSA (obstructive sleep apnea)     severe with AHI 37/hr now on CPAP at 18cm H2O    Past Surgical History  Procedure Laterality Date  . Joint replacement    . Knee arthroscopy Left 12/12  . Carpel tunnel rel Right 4/12  . Rotator cuff repair Left 6/11  . Plantar fascia release Right 12/10  . Bladder suspension  6/10  . Carpel tunnel left/right  7/08, 8/08 Bilateral 8/08 and 7/08  . Knee arthroscopy Right 12/06  . Tennis elbow release Right 7/04  . Abdominal hysterectomy  1/04    partial  . Total knee arthroplasty Left 09/10/2012    Procedure: TOTAL KNEE ARTHROPLASTY- left;  Surgeon: Garald Balding, MD;   Location: [facility];  Service: Orthopedics;  Laterality: Left;  Left total knee arthroplasty  . Left heart catheterization with coronary angiogram N/A 03/10/2014    Procedure: LEFT HEART CATHETERIZATION WITH CORONARY ANGIOGRAM;  Surgeon: Sinclair Grooms, MD;  Location: [facility] CATH LAB;  Service: Cardiovascular;  Laterality: N/A;  . Cardiac catheterization    . Colonoscopy    . Radial tunnel release      right arm   . Cholecystectomy N/A 06/11/2014    Procedure: LAPAROSCOPIC CHOLECYSTECTOMY WITH ATTEMPTED INTRAOPERATIVE CHOLANGIOGRAM;  Surgeon: Jackolyn Confer, MD;  Location: [facility] ORS;  Service: General;  Laterality: N/A;  . Ercp N/A 10/19/2014    Procedure: ENDOSCOPIC RETROGRADE CHOLANGIOPANCREATOGRAPHY (ERCP);  Surgeon: Ladene Artist, MD;  Location: [facility];  Service: Endoscopy;  Laterality: N/A;     Current Outpatient Prescriptions  Medication Sig Dispense Refill  . acetaminophen (TYLENOL) 500 MG tablet Take 1,000 mg by mouth every 8 (eight) hours as needed for mild pain or headache.     Marland Kitchen aspirin 81 MG tablet Take 81 mg by mouth daily.    . Cholecalciferol (VITAMIN D-3) 1000 UNITS CAPS Take 2,000 Units by mouth every morning.     . citalopram (CELEXA) 20  MG tablet TAKE ONE (1) TABLET BY MOUTH EVERY DAY 30 tablet 0  . CRANBERRY-VITAMIN C PO Take 4,200 mg by mouth every morning.     . diclofenac sodium (VOLTAREN) 1 % GEL Apply 1 application topically 2 (two) times daily as needed (Pain in hip and left elbow).    . eletriptan (RELPAX) 40 MG tablet Take 1 tablet (40 mg total) by mouth every 2 (two) hours as needed for migraine. 8 tablet 5  . loratadine (CLARITIN) 10 MG tablet Take 20 mg by mouth 2 (two) times daily as needed for allergies.     . Lutein-Zeaxanthin 25-5 MG CAPS Take 1 capsule by mouth every morning.     . methocarbamol (ROBAXIN) 500 MG tablet Take 500 mg by mouth every 8 (eight) hours as needed for muscle spasms (Taking once daily).    . Methylcobalamin (METHYL B-12 PO) Take  2,500 mcg by mouth every morning.     . metoprolol succinate (TOPROL-XL) 25 MG 24 hr tablet Take 1 tablet (25 mg total) by mouth daily. 30 tablet 11  . Multiple Vitamin (MULTIVITAMIN WITH MINERALS) TABS Take 1 tablet by mouth at bedtime.     . nitroGLYCERIN (NITROSTAT) 0.4 MG SL tablet Place 0.4 mg under the tongue every 5 (five) minutes as needed for chest pain.    . Omega-3 Fatty Acids (FISH OIL PO) Take 1,200 mg by mouth every morning.     Marland Kitchen oxybutynin (DITROPAN-XL) 10 MG 24 hr tablet Take 10 mg by mouth every morning.     . pantoprazole (PROTONIX) 40 MG tablet TAKE ONE (1) TABLET BY MOUTH TWO (2) TIMES DAILY 60 tablet 5  . pregabalin (LYRICA) 200 MG capsule TAKE ONE (1) CAPSULE BY MOUTH EACH AM 30 capsule 5  . ranitidine (ZANTAC) 300 MG capsule Take 1 capsule (300 mg total) by mouth every evening. 30 capsule 11  . simvastatin (ZOCOR) 20 MG tablet Take 1 tablet (20 mg total) by mouth daily. 30 tablet 5  . spironolactone (ALDACTONE) 25 MG tablet Take 1 tablet (25 mg total) by mouth every morning. 30 tablet 5   No current facility-administered medications for this visit.    Allergies:   Topamax; Aleve; Bee venom; Echinacea; Other; Sulfa antibiotics; and Advil    Social History:  The patient  reports that she has never smoked. She has never used smokeless tobacco. She reports that she drinks alcohol. She reports that she does not use illicit drugs.   Family History:  The patient's family history includes Breast cancer in her cousin and other; Clotting disorder in her father; Colon cancer in her paternal grandmother; Coronary artery disease in her father; Hypertension in her brother, father, and mother; Kidney failure in her brother; Liver disease in her mother; Migraines in her brother, daughter, and father; Pancreatic cancer in her paternal grandmother; Stomach cancer in her paternal grandmother; Stroke in her mother. There is no history of Colon polyps or Esophageal cancer.    ROS:  Please  see the history of present illness.   Otherwise, review of systems are positive for Difficulty with anxiety, occasional blood in her urine followed by primary care, chronic back discomfort that's preventing physical activity, excessive fatigue, shortness of breath on exertion..   All other systems are reviewed and negative.    PHYSICAL EXAM: VS:  BP 102/74 mmHg  Pulse 67  Ht 5\' 5"  (1.651 m)  Wt 307 lb 1.9 oz (139.309 kg)  BMI 51.11 kg/m2 , BMI Body mass index is  51.11 kg/(m^2). GEN: Well nourished, well developed, in no acute distress. Morbidly obese. HEENT: normal Neck: no JVD, carotid bruits, or masses Cardiac: RRR.  There is no murmur, rub, or gallop. There is no edema. Respiratory:  clear to auscultation bilaterally, normal work of breathing. GI: soft, nontender, nondistended, + BS MS: no deformity or atrophy Skin: warm and dry, no rash Neuro:  Strength and sensation are intact Psych: euthymic mood, full affect   EKG:  EKG is not ordered today.    Recent Labs: 09/30/2014: BUN 12; Creatinine, Ser 0.88; Potassium 4.1; Sodium 138 10/07/2014: Hemoglobin 13.8; Platelets 255.0 12/07/2014: ALT 27    Lipid Panel    Component Value Date/Time   CHOL 153 03/10/2014 1030   TRIG 94 03/10/2014 1030   HDL 36* 03/10/2014 1030   CHOLHDL 4.3 03/10/2014 1030   VLDL 19 03/10/2014 1030   LDLCALC 98 03/10/2014 1030      Wt Readings from Last 3 Encounters:  06/08/15 307 lb 1.9 oz (139.309 kg)  02/24/15 305 lb 3.2 oz (138.438 kg)  01/28/15 293 lb (132.904 kg)      Other studies Reviewed: Additional studies/ records that were reviewed today include: None. The findings include none.    ASSESSMENT AND PLAN:  1. Benign essential HTN Excellent control  2. Diastolic heart failure, stage B No evidence of volume overload  3. OSA (obstructive sleep apnea) Using C Pap but having difficulty tolerating  4. Morbid obesity, unspecified obesity type ([place]) Progressive  5. Angina pectoris  ([place]) Resolved and was probably noncardiac. Negative myocardial perfusion imaging.    Current medicines are reviewed at length with the patient today.  The patient has the following concerns regarding medicines: none.  The following changes/actions have been instituted:    Aerobic activity as tolerated to facilitate weight loss and conditioning  Decreased caloric intake and low salt diet  Labs/ tests ordered today include:  No orders of the defined types were placed in this encounter.     Disposition:   FU with HS in 1 year  Signed, Sinclair Grooms, MD  06/08/2015 8:41 AM    [address], [place], [place]  [address] Phone: [phone]; Fax: [phone]

## 2015-06-08 NOTE — Patient Instructions (Signed)
Medication Instructions:  Your physician recommends that you continue on your current medications as directed. Please refer to the Current Medication list given to you today.   Labwork: None ordered  Testing/Procedures: None ordered  Follow-Up: Your physician wants you to follow-up in: 1 year with Dr.Smith You will receive a reminder letter in the mail two months in advance. If you don't receive a letter, please call our office to schedule the follow-up appointment.   Any Other Special Instructions Will Be Listed Below (If Applicable). Your physician discussed the importance of regular exercise and recommended that you start or continue a regular exercise program for good health.     If you need a refill on your cardiac medications before your next appointment, please call your pharmacy.

## 2015-06-26 ENCOUNTER — Other Ambulatory Visit: Payer: Self-pay | Admitting: Internal Medicine

## 2015-06-28 ENCOUNTER — Other Ambulatory Visit: Payer: Self-pay

## 2015-06-28 MED ORDER — NITROGLYCERIN 0.4 MG SL SUBL: 0.4000 mg | SUBLINGUAL_TABLET | SUBLINGUAL | Status: DC | PRN

## 2015-07-28 ENCOUNTER — Encounter: Payer: Self-pay | Admitting: Internal Medicine

## 2015-07-28 ENCOUNTER — Other Ambulatory Visit (INDEPENDENT_AMBULATORY_CARE_PROVIDER_SITE_OTHER): Payer: 59

## 2015-07-28 ENCOUNTER — Ambulatory Visit (INDEPENDENT_AMBULATORY_CARE_PROVIDER_SITE_OTHER): Payer: 59 | Admitting: Internal Medicine

## 2015-07-28 VITALS — BP 116/62 | HR 57 | Temp 98.2°F | Resp 12 | Ht 65.5 in | Wt 314.0 lb

## 2015-07-28 DIAGNOSIS — E785 Hyperlipidemia, unspecified: Secondary | ICD-10-CM | POA: Diagnosis not present

## 2015-07-28 DIAGNOSIS — Z0000 Encounter for general adult medical examination without abnormal findings: Secondary | ICD-10-CM | POA: Diagnosis not present

## 2015-07-28 DIAGNOSIS — I10 Essential (primary) hypertension: Secondary | ICD-10-CM | POA: Diagnosis not present

## 2015-07-28 DIAGNOSIS — G4733 Obstructive sleep apnea (adult) (pediatric): Secondary | ICD-10-CM | POA: Diagnosis not present

## 2015-07-28 LAB — LIPID PANEL
CHOL/HDL RATIO: 5
Cholesterol: 146 mg/dL (ref 0–200)
HDL: 31.3 mg/dL — ABNORMAL LOW (ref >39.00)
LDL Cholesterol: 88 mg/dL (ref 0–99)
NONHDL: 114.23
TRIGLYCERIDES: 133 mg/dL (ref 0.0–149.0)
VLDL: 26.6 mg/dL (ref 0.0–40.0)

## 2015-07-28 LAB — COMPREHENSIVE METABOLIC PANEL
ALT: 29 U/L (ref 0–35)
AST: 19 U/L (ref 0–37)
Albumin: 4 g/dL (ref 3.5–5.2)
Alkaline Phosphatase: 57 U/L (ref 39–117)
BILIRUBIN TOTAL: 0.4 mg/dL (ref 0.2–1.2)
BUN: 14 mg/dL (ref 6–23)
CALCIUM: 9.7 mg/dL (ref 8.4–10.5)
CO2: 30 meq/L (ref 19–32)
CREATININE: 0.98 mg/dL (ref 0.40–1.20)
Chloride: 105 mEq/L (ref 96–112)
GFR: 62 mL/min (ref >60.00)
GLUCOSE: 107 mg/dL — AB (ref 70–99)
Potassium: 4.3 mEq/L (ref 3.5–5.1)
Sodium: 142 mEq/L (ref 135–145)
Total Protein: 6.7 g/dL (ref 6.0–8.3)

## 2015-07-28 LAB — HEMOGLOBIN A1C: Hgb A1c MFr Bld: 5.9 % (ref 4.6–6.5)

## 2015-07-28 LAB — CBC
HEMATOCRIT: 41.6 % (ref 36.0–46.0)
Hemoglobin: 14.1 g/dL (ref 12.0–15.0)
MCHC: 33.9 g/dL (ref 30.0–36.0)
MCV: 89.1 fl (ref 78.0–100.0)
PLATELETS: 173 10*3/uL (ref 150.0–400.0)
RBC: 4.67 Mil/uL (ref 3.87–5.11)
RDW: 13.5 % (ref 11.5–15.5)
WBC: 5.9 10*3/uL (ref 4.0–10.5)

## 2015-07-28 NOTE — Progress Notes (Signed)
   Subjective:    Patient ID: Heather Frederick, female    DOB: 09-19-1955, 60 y.o.   MRN: [id number]  HPI The patient is a 60 YO female coming in for wellness. No new concerns.   PMH, [facility], social history reviewed and updated.   Review of Systems  Constitutional: Negative for fever, chills, activity change, appetite change, fatigue and unexpected weight change.  Respiratory: Negative for cough, chest tightness, shortness of breath and wheezing.   Cardiovascular: Negative for chest pain, palpitations and leg swelling.  Gastrointestinal: Negative for abdominal pain, diarrhea, constipation and abdominal distention.  Musculoskeletal: Negative.   Skin: Negative.   Neurological: Negative.   Psychiatric/Behavioral: Negative.       Objective:   Physical Exam  Constitutional: She appears well-developed and well-nourished.  Overweight  HENT:  Head: Normocephalic and atraumatic.  Right Ear: External ear normal.  Left Ear: External ear normal.  Eyes: EOM are normal.  Neck: Normal range of motion.  Cardiovascular: Normal rate and regular rhythm.   Pulmonary/Chest: Effort normal and breath sounds normal.  Abdominal: Soft. Bowel sounds are normal.  Musculoskeletal: She exhibits no edema.  Neurological: Coordination normal.  Skin: Skin is warm and dry.   Filed Vitals:   07/28/15 0823  BP: 116/62  Pulse: 57  Temp: 98.2 F (36.8 C)  TempSrc: Oral  Resp: 12  Height: 5' 5.5" (1.664 m)  Weight: 314 lb (142.429 kg)  SpO2: 97%      Assessment & Plan:

## 2015-07-28 NOTE — Assessment & Plan Note (Signed)
Immunizations up to date, mammogram up to date, colonoscopy up to date. Checking labs and adjust as needed. Counseled about sun safety and mole surveillance. Given screening recommendations.

## 2015-07-28 NOTE — Patient Instructions (Signed)
We will check the blood work today and send the results.   Work on walking in water this summer routinely for exercise.   Health Maintenance, Female Adopting a healthy lifestyle and getting preventive care can go a long way to promote health and wellness. Talk with your health care provider about what schedule of regular examinations is right for you. This is a good chance for you to check in with your provider about disease prevention and staying healthy. In between checkups, there are plenty of things you can do on your own. Experts have done a lot of research about which lifestyle changes and preventive measures are most likely to keep you healthy. Ask your health care provider for more information. WEIGHT AND DIET  Eat a healthy diet  Be sure to include plenty of vegetables, fruits, low-fat dairy products, and lean protein.  Do not eat a lot of foods high in solid fats, added sugars, or salt.  Get regular exercise. This is one of the most important things you can do for your health.  Most adults should exercise for at least 150 minutes each week. The exercise should increase your heart rate and make you sweat (moderate-intensity exercise).  Most adults should also do strengthening exercises at least twice a week. This is in addition to the moderate-intensity exercise.  Maintain a healthy weight  Body mass index (BMI) is a measurement that can be used to identify possible weight problems. It estimates body fat based on height and weight. Your health care provider can help determine your BMI and help you achieve or maintain a healthy weight.  For females 55 years of age and older:   A BMI below 18.5 is considered underweight.  A BMI of 18.5 to 24.9 is normal.  A BMI of 25 to 29.9 is considered overweight.  A BMI of 30 and above is considered obese.  Watch levels of cholesterol and blood lipids  You should start having your blood tested for lipids and cholesterol at 58 years of  age, then have this test every 5 years.  You may need to have your cholesterol levels checked more often if:  Your lipid or cholesterol levels are high.  You are older than 58 years of age.  You are at high risk for heart disease.  CANCER SCREENING   Lung Cancer  Lung cancer screening is recommended for adults 4-50 years old who are at high risk for lung cancer because of a history of smoking.  A yearly low-dose CT scan of the lungs is recommended for people who:  Currently smoke.  Have quit within the past 15 years.  Have at least a 30-pack-year history of smoking. A pack year is smoking an average of one pack of cigarettes a day for 1 year.  Yearly screening should continue until it has been 15 years since you quit.  Yearly screening should stop if you develop a health problem that would prevent you from having lung cancer treatment.  Breast Cancer  Practice breast self-awareness. This means understanding how your breasts normally appear and feel.  It also means doing regular breast self-exams. Let your health care provider know about any changes, no matter how small.  If you are in your 20s or 30s, you should have a clinical breast exam (CBE) by a health care provider every 1-3 years as part of a regular health exam.  If you are 61 or older, have a CBE every year. Also consider having a breast X-ray (  mammogram) every year.  If you have a family history of breast cancer, talk to your health care provider about genetic screening.  If you are at high risk for breast cancer, talk to your health care provider about having an MRI and a mammogram every year.  Breast cancer gene (BRCA) assessment is recommended for women who have family members with BRCA-related cancers. BRCA-related cancers include:  Breast.  Ovarian.  Tubal.  Peritoneal cancers.  Results of the assessment will determine the need for genetic counseling and BRCA1 and BRCA2 testing. Cervical  Cancer Your health care provider may recommend that you be screened regularly for cancer of the pelvic organs (ovaries, uterus, and vagina). This screening involves a pelvic examination, including checking for microscopic changes to the surface of your cervix (Pap test). You may be encouraged to have this screening done every 3 years, beginning at age 21.  For women ages 67-65, health care providers may recommend pelvic exams and Pap testing every 3 years, or they may recommend the Pap and pelvic exam, combined with testing for human papilloma virus (HPV), every 5 years. Some types of HPV increase your risk of cervical cancer. Testing for HPV may also be done on women of any age with unclear Pap test results.  Other health care providers may not recommend any screening for nonpregnant women who are considered low risk for pelvic cancer and who do not have symptoms. Ask your health care provider if a screening pelvic exam is right for you.  If you have had past treatment for cervical cancer or a condition that could lead to cancer, you need Pap tests and screening for cancer for at least 20 years after your treatment. If Pap tests have been discontinued, your risk factors (such as having a new sexual partner) need to be reassessed to determine if screening should resume. Some women have medical problems that increase the chance of getting cervical cancer. In these cases, your health care provider may recommend more frequent screening and Pap tests. Colorectal Cancer  This type of cancer can be detected and often prevented.  Routine colorectal cancer screening usually begins at 58 years of age and continues through 58 years of age.  Your health care provider may recommend screening at an earlier age if you have risk factors for colon cancer.  Your health care provider may also recommend using home test kits to check for hidden blood in the stool.  A small camera at the end of a tube can be used to  examine your colon directly (sigmoidoscopy or colonoscopy). This is done to check for the earliest forms of colorectal cancer.  Routine screening usually begins at age 62.  Direct examination of the colon should be repeated every 5-10 years through 58 years of age. However, you may need to be screened more often if early forms of precancerous polyps or small growths are found. Skin Cancer  Check your skin from head to toe regularly.  Tell your health care provider about any new moles or changes in moles, especially if there is a change in a mole's shape or color.  Also tell your health care provider if you have a mole that is larger than the size of a pencil eraser.  Always use sunscreen. Apply sunscreen liberally and repeatedly throughout the day.  Protect yourself by wearing long sleeves, pants, a wide-brimmed hat, and sunglasses whenever you are outside. HEART DISEASE, DIABETES, AND HIGH BLOOD PRESSURE   High blood pressure causes heart disease  and increases the risk of stroke. High blood pressure is more likely to develop in:  People who have blood pressure in the high end of the normal range (130-139/85-89 mm Hg).  People who are overweight or obese.  People who are African American.  If you are 21-35 years of age, have your blood pressure checked every 3-5 years. If you are 14 years of age or older, have your blood pressure checked every year. You should have your blood pressure measured twice--once when you are at a hospital or clinic, and once when you are not at a hospital or clinic. Record the average of the two measurements. To check your blood pressure when you are not at a hospital or clinic, you can use:  An automated blood pressure machine at a pharmacy.  A home blood pressure monitor.  If you are between 11 years and 65 years old, ask your health care provider if you should take aspirin to prevent strokes.  Have regular diabetes screenings. This involves taking a  blood sample to check your fasting blood sugar level.  If you are at a normal weight and have a low risk for diabetes, have this test once every three years after 58 years of age.  If you are overweight and have a high risk for diabetes, consider being tested at a younger age or more often. PREVENTING INFECTION  Hepatitis B  If you have a higher risk for hepatitis B, you should be screened for this virus. You are considered at high risk for hepatitis B if:  You were born in a country where hepatitis B is common. Ask your health care provider which countries are considered high risk.  Your parents were born in a high-risk country, and you have not been immunized against hepatitis B (hepatitis B vaccine).  You have HIV or AIDS.  You use needles to inject street drugs.  You live with someone who has hepatitis B.  You have had sex with someone who has hepatitis B.  You get hemodialysis treatment.  You take certain medicines for conditions, including cancer, organ transplantation, and autoimmune conditions. Hepatitis C  Blood testing is recommended for:  Everyone born from [birth year] through 1963.  Anyone with known risk factors for hepatitis C. Sexually transmitted infections (STIs)  You should be screened for sexually transmitted infections (STIs) including gonorrhea and chlamydia if:  You are sexually active and are younger than 58 years of age.  You are older than 58 years of age and your health care provider tells you that you are at risk for this type of infection.  Your sexual activity has changed since you were last screened and you are at an increased risk for chlamydia or gonorrhea. Ask your health care provider if you are at risk.  If you do not have HIV, but are at risk, it may be recommended that you take a prescription medicine daily to prevent HIV infection. This is called pre-exposure prophylaxis (PrEP). You are considered at risk if:  You are sexually active and do  not regularly use condoms or know the HIV status of your partner(s).  You take drugs by injection.  You are sexually active with a partner who has HIV. Talk with your health care provider about whether you are at high risk of being infected with HIV. If you choose to begin PrEP, you should first be tested for HIV. You should then be tested every 3 months for as long as you are taking PrEP.  PREGNANCY   If you are premenopausal and you may become pregnant, ask your health care provider about preconception counseling.  If you may become pregnant, take 400 to 800 micrograms (mcg) of folic acid every day.  If you want to prevent pregnancy, talk to your health care provider about birth control (contraception). OSTEOPOROSIS AND MENOPAUSE   Osteoporosis is a disease in which the bones lose minerals and strength with aging. This can result in serious bone fractures. Your risk for osteoporosis can be identified using a bone density scan.  If you are 49 years of age or older, or if you are at risk for osteoporosis and fractures, ask your health care provider if you should be screened.  Ask your health care provider whether you should take a calcium or vitamin D supplement to lower your risk for osteoporosis.  Menopause may have certain physical symptoms and risks.  Hormone replacement therapy may reduce some of these symptoms and risks. Talk to your health care provider about whether hormone replacement therapy is right for you.  HOME CARE INSTRUCTIONS   Schedule regular health, dental, and eye exams.  Stay current with your immunizations.   Do not use any tobacco products including cigarettes, chewing tobacco, or electronic cigarettes.  If you are pregnant, do not drink alcohol.  If you are breastfeeding, limit how much and how often you drink alcohol.  Limit alcohol intake to no more than 1 drink per day for nonpregnant women. One drink equals 12 ounces of beer, 5 ounces of wine, or 1  ounces of hard liquor.  Do not use street drugs.  Do not share needles.  Ask your health care provider for help if you need support or information about quitting drugs.  Tell your health care provider if you often feel depressed.  Tell your health care provider if you have ever been abused or do not feel safe at home.   This information is not intended to replace advice given to you by your health care provider. Make sure you discuss any questions you have with your health care provider.   Document Released: 09/26/2010 Document Revised: 04/03/2014 Document Reviewed: 02/12/2013 Elsevier Interactive Patient Education [employer].

## 2015-07-28 NOTE — Assessment & Plan Note (Signed)
BP at goal on metoprolol and spironolactone, checking CMP and adjust as needed.

## 2015-07-28 NOTE — Progress Notes (Signed)
Pre visit review using our clinic review tool, if applicable. No additional management support is needed unless otherwise documented below in the visit note. 

## 2015-07-28 NOTE — Assessment & Plan Note (Signed)
Using CPAP but having some problems with the fit of the nasal pillows and is going to get home health to adjust.

## 2015-07-28 NOTE — Assessment & Plan Note (Signed)
Checking lipid panel and adjust as needed. On simvastatin daily.

## 2015-08-27 ENCOUNTER — Other Ambulatory Visit: Payer: Self-pay | Admitting: Nurse Practitioner

## 2015-08-27 ENCOUNTER — Encounter: Payer: Self-pay | Admitting: Family

## 2015-08-27 ENCOUNTER — Other Ambulatory Visit: Payer: Self-pay | Admitting: Interventional Cardiology

## 2015-08-27 ENCOUNTER — Ambulatory Visit (INDEPENDENT_AMBULATORY_CARE_PROVIDER_SITE_OTHER): Payer: 59 | Admitting: Family

## 2015-08-27 VITALS — BP 118/68 | HR 66 | Temp 97.9°F | Resp 16 | Ht 65.0 in | Wt 307.8 lb

## 2015-08-27 DIAGNOSIS — S060X0A Concussion without loss of consciousness, initial encounter: Secondary | ICD-10-CM

## 2015-08-27 NOTE — Patient Instructions (Signed)
Thank you for choosing [facility].  Summary/Instructions:  Please continue to take your medication as prescribed.  Ice as needed for discomfort.  If your symptoms worsen or fail to improve, please contact our office for further instruction, or in case of emergency go directly to the emergency room at the closest medical facility.    Concussion, Adult A concussion, or closed-head injury, is a brain injury caused by a direct blow to the head or by a quick and sudden movement (jolt) of the head or neck. Concussions are usually not life-threatening. Even so, the effects of a concussion can be serious. If you have had a concussion before, you are more likely to experience concussion-like symptoms after a direct blow to the head.  CAUSES  Direct blow to the head, such as from running into another player during a soccer game, being hit in a fight, or hitting your head on a hard surface.  A jolt of the head or neck that causes the brain to move back and forth inside the skull, such as in a car crash. SIGNS AND SYMPTOMS The signs of a concussion can be hard to notice. Early on, they may be missed by you, family members, and health care providers. You may look fine but act or feel differently. Symptoms are usually temporary, but they may last for days, weeks, or even longer. Some symptoms may appear right away while others may not show up for hours or days. Every head injury is different. Symptoms include:  Mild to moderate headaches that will not go away.  A feeling of pressure inside your head.  Having more trouble than usual:  Learning or remembering things you have heard.  Answering questions.  Paying attention or concentrating.  Organizing daily tasks.  Making decisions and solving problems.  Slowness in thinking, acting or reacting, speaking, or reading.  Getting lost or being easily confused.  Feeling tired all the time or lacking energy (fatigued).  Feeling  drowsy.  Sleep disturbances.  Sleeping more than usual.  Sleeping less than usual.  Trouble falling asleep.  Trouble sleeping (insomnia).  Loss of balance or feeling lightheaded or dizzy.  Nausea or vomiting.  Numbness or tingling.  Increased sensitivity to:  Sounds.  Lights.  Distractions.  Vision problems or eyes that tire easily.  Diminished sense of taste or smell.  Ringing in the ears.  Mood changes such as feeling sad or anxious.  Becoming easily irritated or angry for little or no reason.  Lack of motivation.  Seeing or hearing things other people do not see or hear (hallucinations). DIAGNOSIS Your health care provider can usually diagnose a concussion based on a description of your injury and symptoms. He or she will ask whether you passed out (lost consciousness) and whether you are having trouble remembering events that happened right before and during your injury. Your evaluation might include:  A brain scan to look for signs of injury to the brain. Even if the test shows no injury, you may still have a concussion.  Blood tests to be sure other problems are not present. TREATMENT  Concussions are usually treated in an emergency department, in urgent care, or at a clinic. You may need to stay in the hospital overnight for further treatment.  Tell your health care provider if you are taking any medicines, including prescription medicines, over-the-counter medicines, and natural remedies. Some medicines, such as blood thinners (anticoagulants) and aspirin, may increase the chance of complications. Also tell your health care  provider whether you have had alcohol or are taking illegal drugs. This information may affect treatment.  Your health care provider will send you home with important instructions to follow.  How fast you will recover from a concussion depends on many factors. These factors include how severe your concussion is, what part of your brain  was injured, your age, and how healthy you were before the concussion.  Most people with mild injuries recover fully. Recovery can take time. In general, recovery is slower in older persons. Also, persons who have had a concussion in the past or have other medical problems may find that it takes longer to recover from their current injury. HOME CARE INSTRUCTIONS General Instructions  Carefully follow the directions your health care provider gave you.  Only take over-the-counter or prescription medicines for pain, discomfort, or fever as directed by your health care provider.  Take only those medicines that your health care provider has approved.  Do not drink alcohol until your health care provider says you are well enough to do so. Alcohol and certain other drugs may slow your recovery and can put you at risk of further injury.  If it is harder than usual to remember things, write them down.  If you are easily distracted, try to do one thing at a time. For example, do not try to watch TV while fixing dinner.  Talk with family members or close friends when making important decisions.  Keep all follow-up appointments. Repeated evaluation of your symptoms is recommended for your recovery.  Watch your symptoms and tell others to do the same. Complications sometimes occur after a concussion. Older adults with a brain injury may have a higher risk of serious complications, such as a blood clot on the brain.  Tell your teachers, school nurse, school counselor, coach, athletic trainer, or work Freight forwarder about your injury, symptoms, and restrictions. Tell them about what you can or cannot do. They should watch for:  Increased problems with attention or concentration.  Increased difficulty remembering or learning new information.  Increased time needed to complete tasks or assignments.  Increased irritability or decreased ability to cope with stress.  Increased symptoms.  Rest. Rest helps  the brain to heal. Make sure you:  Get plenty of sleep at night. Avoid staying up late at night.  Keep the same bedtime hours on weekends and weekdays.  Rest during the day. Take daytime naps or rest breaks when you feel tired.  Limit activities that require a lot of thought or concentration. These include:  Doing homework or job-related work.  Watching TV.  Working on the computer.  Avoid any situation where there is potential for another head injury (football, hockey, soccer, basketball, martial arts, downhill snow sports and horseback riding). Your condition will get worse every time you experience a concussion. You should avoid these activities until you are evaluated by the appropriate follow-up health care providers. Returning To Your Regular Activities You will need to return to your normal activities slowly, not all at once. You must give your body and brain enough time for recovery.  Do not return to sports or other athletic activities until your health care provider tells you it is safe to do so.  Ask your health care provider when you can drive, ride a bicycle, or operate heavy machinery. Your ability to react may be slower after a brain injury. Never do these activities if you are dizzy.  Ask your health care provider about when you can  return to work or school. Preventing Another Concussion It is very important to avoid another brain injury, especially before you have recovered. In rare cases, another injury can lead to permanent brain damage, brain swelling, or death. The risk of this is greatest during the first 7-10 days after a head injury. Avoid injuries by:  Wearing a seat belt when riding in a car.  Drinking alcohol only in moderation.  Wearing a helmet when biking, skiing, skateboarding, skating, or doing similar activities.  Avoiding activities that could lead to a second concussion, such as contact or recreational sports, until your health care provider says  it is okay.  Taking safety measures in your home.  Remove clutter and tripping hazards from floors and stairways.  Use grab bars in bathrooms and handrails by stairs.  Place non-slip mats on floors and in bathtubs.  Improve lighting in dim areas. SEEK MEDICAL CARE IF:  You have increased problems paying attention or concentrating.  You have increased difficulty remembering or learning new information.  You need more time to complete tasks or assignments than before.  You have increased irritability or decreased ability to cope with stress.  You have more symptoms than before. Seek medical care if you have any of the following symptoms for more than 2 weeks after your injury:  Lasting (chronic) headaches.  Dizziness or balance problems.  Nausea.  Vision problems.  Increased sensitivity to noise or light.  Depression or mood swings.  Anxiety or irritability.  Memory problems.  Difficulty concentrating or paying attention.  Sleep problems.  Feeling tired all the time. SEEK IMMEDIATE MEDICAL CARE IF:  You have severe or worsening headaches. These may be a sign of a blood clot in the brain.  You have weakness (even if only in one hand, leg, or part of the face).  You have numbness.  You have decreased coordination.  You vomit repeatedly.  You have increased sleepiness.  One pupil is larger than the other.  You have convulsions.  You have slurred speech.  You have increased confusion. This may be a sign of a blood clot in the brain.  You have increased restlessness, agitation, or irritability.  You are unable to recognize people or places.  You have neck pain.  It is difficult to wake you up.  You have unusual behavior changes.  You lose consciousness. MAKE SURE YOU:  Understand these instructions.  Will watch your condition.  Will get help right away if you are not doing well or get worse.   This information is not intended to replace  advice given to you by your health care provider. Make sure you discuss any questions you have with your health care provider.   Document Released: 06/03/2003 Document Revised: 04/03/2014 Document Reviewed: 10/03/2012 Elsevier Interactive Patient Education [employer].

## 2015-08-27 NOTE — Progress Notes (Signed)
Pre visit review using our clinic review tool, if applicable. No additional management support is needed unless otherwise documented below in the visit note. 

## 2015-08-27 NOTE — Assessment & Plan Note (Signed)
Neurological exam is stable. Symptoms and exam consistent with mild concussion. Treat conservatively with over-the-counter medications as needed for symptom relief and supportive care. Ice as needed for discomfort of contusion on head. Follow-up if symptoms worsen or do not improve. Return symptom information provided.

## 2015-08-27 NOTE — Progress Notes (Signed)
Subjective:    Patient ID: Heather Frederick, female    DOB: [birth year], 60 y.o.   MRN: [id number]  Chief Complaint  Patient presents with  . Fall    had a bad fall sunday night, fell off a swing and was hit on the back of head, head is still sore and she just wants to make sure it is ok    HPI:  Heather Frederick is a 60 y.o. female who  has a past medical history of Anxiety; Mental disorder; GERD (gastroesophageal reflux disease); Arthritis; Hard of hearing; Migraine headache; Obesity; Depression; Lateral epicondylitis of right elbow; Dyslipidemia; History of frequent urinary tract infections; Osteoarthritis; Gallstones; Urinary tract infection; Hypertension; Raynaud disease; Meralgia paresthetica of right side (10/02/2012); Hepatic steatosis; Benign essential HTN (09/23/2014); Elevated liver enzymes; Family history of anesthesia complication; and OSA (obstructive sleep apnea). and presents today For an acute office visit.  This is a new problem. Recently experienced a fall approximately 5 nights ago when falling off a swing and striking the back of her head on a banister and then was struck again by the swing. Describes initial dizziness with no loss of consciousness. Continues to experience associated symptom of soreness located in the back of her head and the top. Endorses a mild headaches with no changes in vision, nausea or vomiting.  Modifying factors include Tylenol which has not helped with this headache.    Allergies  Allergen Reactions  . Topamax [Topiramate] Other (See Comments)    :Stroke like symptoms  . Aleve [Naproxen Sodium] Hives    Has tolerated Voltaren topical as well as aspirin.  . Bee Venom Swelling  . Echinacea Hives  . Other Other (See Comments)    Feathers cause sinus congestion  . Sulfa Antibiotics Hives  . Advil [Ibuprofen] Hives    Has tolerated Voltaren topical as well as aspirin.     Current Outpatient Prescriptions on File Prior to Visit  Medication Sig  Dispense Refill  . acetaminophen (TYLENOL) 500 MG tablet Take 1,000 mg by mouth every 8 (eight) hours as needed for mild pain or headache.     Marland Kitchen aspirin 81 MG tablet Take 81 mg by mouth daily.    . Cholecalciferol (VITAMIN D-3) 1000 UNITS CAPS Take 2,000 Units by mouth every morning.     . citalopram (CELEXA) 20 MG tablet TAKE ONE (1) TABLET BY MOUTH EVERY DAY 30 tablet 3  . CRANBERRY-VITAMIN C PO Take 4,200 mg by mouth every morning.     . eletriptan (RELPAX) 40 MG tablet Take 1 tablet (40 mg total) by mouth every 2 (two) hours as needed for migraine. 8 tablet 5  . loratadine (CLARITIN) 10 MG tablet Take 20 mg by mouth 2 (two) times daily as needed for allergies.     . Lutein-Zeaxanthin 25-5 MG CAPS Take 1 capsule by mouth every morning.     . methocarbamol (ROBAXIN) 500 MG tablet Take 500 mg by mouth every 8 (eight) hours as needed for muscle spasms (Taking once daily).    . Methylcobalamin (METHYL B-12 PO) Take 2,500 mcg by mouth every morning.     . metoprolol succinate (TOPROL-XL) 25 MG 24 hr tablet Take 1 tablet (25 mg total) by mouth daily. 30 tablet 11  . Multiple Vitamin (MULTIVITAMIN WITH MINERALS) TABS Take 1 tablet by mouth at bedtime.     . nitrofurantoin (MACRODANTIN) 100 MG capsule     . nitroGLYCERIN (NITROSTAT) 0.4 MG SL tablet Place 1 tablet (0.4  mg total) under the tongue every 5 (five) minutes as needed for chest pain. 25 tablet 5  . Omega-3 Fatty Acids (FISH OIL PO) Take 1,200 mg by mouth every morning.     Marland Kitchen oxybutynin (DITROPAN-XL) 10 MG 24 hr tablet Take 10 mg by mouth every morning.     . pantoprazole (PROTONIX) 40 MG tablet TAKE ONE (1) TABLET BY MOUTH TWO (2) TIMES DAILY 60 tablet 5  . ranitidine (ZANTAC) 300 MG capsule Take 1 capsule (300 mg total) by mouth every evening. 30 capsule 11  . simvastatin (ZOCOR) 20 MG tablet TAKE ONE (1) TABLET BY MOUTH EVERY DAY 30 tablet 3   No current facility-administered medications on file prior to visit.     Past Surgical  History  Procedure Laterality Date  . Joint replacement    . Knee arthroscopy Left 12/12  . Carpel tunnel rel Right 4/12  . Rotator cuff repair Left 6/11  . Plantar fascia release Right 12/10  . Bladder suspension  6/10  . Carpel tunnel left/right  7/08, 8/08 Bilateral 8/08 and 7/08  . Knee arthroscopy Right 12/06  . Tennis elbow release Right 7/04  . Abdominal hysterectomy  1/04    partial  . Total knee arthroplasty Left 09/10/2012    Procedure: TOTAL KNEE ARTHROPLASTY- left;  Surgeon: Garald Balding, MD;  Location: [place];  Service: Orthopedics;  Laterality: Left;  Left total knee arthroplasty  . Left heart catheterization with coronary angiogram N/A 03/10/2014    Procedure: LEFT HEART CATHETERIZATION WITH CORONARY ANGIOGRAM;  Surgeon: Sinclair Grooms, MD;  Location: [facility] CATH [facility];  Service: Cardiovascular;  Laterality: N/A;  . Cardiac catheterization    . Colonoscopy    . Radial tunnel release      right arm   . Cholecystectomy N/A 06/11/2014    Procedure: LAPAROSCOPIC CHOLECYSTECTOMY WITH ATTEMPTED INTRAOPERATIVE CHOLANGIOGRAM;  Surgeon: Jackolyn Confer, MD;  Location: [facility];  Service: General;  Laterality: N/A;  . Ercp N/A 10/19/2014    Procedure: ENDOSCOPIC RETROGRADE CHOLANGIOPANCREATOGRAPHY (ERCP);  Surgeon: Ladene Artist, MD;  Location: [facility];  Service: Endoscopy;  Laterality: N/A;        Review of Systems  Constitutional: Negative for fever and chills.  Eyes:       No changes in vision.  Gastrointestinal: Negative for nausea and vomiting.  Neurological: Positive for headaches. Negative for dizziness and weakness.      Objective:    BP 118/68 mmHg  Pulse 66  Temp(Src) 97.9 F (36.6 C) (Oral)  Resp 16  Ht 5\' 5"  (1.651 m)  Wt 307 lb 12.8 oz (139.617 kg)  BMI 51.22 kg/m2  SpO2 94% Nursing note and vital signs reviewed.  Physical Exam  Constitutional: She is oriented to person, place, and time. She appears well-developed and well-nourished. No  distress.  HENT:  Head: Head is with contusion. Head is without raccoon's eyes, without Battle's sign and without abrasion.  Eyes: Conjunctivae and EOM are normal. Pupils are equal, round, and reactive to light.  Cardiovascular: Normal rate, regular rhythm, normal heart sounds and intact distal pulses.   Pulmonary/Chest: Effort normal and breath sounds normal.  Neurological: She is alert and oriented to person, place, and time. She has normal reflexes. No cranial nerve deficit. She exhibits normal muscle tone.  Skin: Skin is warm and dry.  Psychiatric: She has a normal mood and affect. Her behavior is normal. Judgment and thought content normal.       Assessment & Plan:  Problem List Items Addressed This Visit      Nervous and Auditory   Concussion with no loss of consciousness - Primary    Neurological exam is stable. Symptoms and exam consistent with mild concussion. Treat conservatively with over-the-counter medications as needed for symptom relief and supportive care. Ice as needed for discomfort of contusion on head. Follow-up if symptoms worsen or do not improve. Return symptom information provided.          I have discontinued Ms. Kumari's diclofenac sodium. I am also having her maintain her loratadine, multivitamin with minerals, Lutein-Zeaxanthin, oxybutynin, Vitamin D-3, CRANBERRY-VITAMIN C PO, Methylcobalamin (METHYL B-12 PO), Omega-3 Fatty Acids (FISH OIL PO), ranitidine, acetaminophen, aspirin, methocarbamol, eletriptan, metoprolol succinate, pantoprazole, citalopram, simvastatin, nitroGLYCERIN, nitrofurantoin, spironolactone, and LYRICA.   Follow-up: Return if symptoms worsen or fail to improve.  Mauricio Po, FNP

## 2015-08-27 NOTE — Telephone Encounter (Signed)
Rx printed, signed, faxed to pharmacy. 

## 2015-10-23 ENCOUNTER — Other Ambulatory Visit: Payer: Self-pay | Admitting: Internal Medicine

## 2015-10-26 ENCOUNTER — Encounter: Payer: Self-pay | Admitting: Nurse Practitioner

## 2015-10-26 ENCOUNTER — Ambulatory Visit (INDEPENDENT_AMBULATORY_CARE_PROVIDER_SITE_OTHER): Payer: 59 | Admitting: Nurse Practitioner

## 2015-10-26 VITALS — BP 100/61 | HR 55 | Ht 65.0 in | Wt 311.8 lb

## 2015-10-26 DIAGNOSIS — G43009 Migraine without aura, not intractable, without status migrainosus: Secondary | ICD-10-CM | POA: Diagnosis not present

## 2015-10-26 DIAGNOSIS — G5711 Meralgia paresthetica, right lower limb: Secondary | ICD-10-CM

## 2015-10-26 MED ORDER — ELETRIPTAN HYDROBROMIDE 40 MG PO TABS: 40.0000 mg | ORAL_TABLET | ORAL | 8 refills | Status: DC | PRN

## 2015-10-26 MED ORDER — PREGABALIN 200 MG PO CAPS: ORAL_CAPSULE | ORAL | 5 refills | Status: DC

## 2015-10-26 NOTE — Patient Instructions (Signed)
Continue Lyrica 200 mg daily will refill Continue Relpax acutely will refill  Continue Toprol daily refilled by Dr. Tamala Julian Follow-up in 8  months

## 2015-10-26 NOTE — Progress Notes (Signed)
I have read the note, and I agree with the clinical assessment and plan.  WILLIS,CHARLES KEITH   

## 2015-10-26 NOTE — Progress Notes (Signed)
[facility]  PATIENT: SURYA BEINE DOB: 08-Apr-1955   REASON FOR VISIT: follow up for Migraine, meralgia paresthetica right side HISTORY FROM: Patient    HISTORY OF PRESENT ILLNESS:Ms. Lafleche is a 60 year old right-handed white female with a history of morbid obesity and a history of migraine headache. She also has a history of obstructive sleep apnea.  and is on CPAP 18 cm at night. The patient has done well with her migraines.She is  retired from her job. The patient is having approximately one headache a month at this time or less.  The patient is  on Toprol, which she is currently taking. The patient is on Relpax if needed for the headache. The patient indicates that if she can get to the headache quickly, the headache will only last about 15-20 minutes with the Relpax.  She is on Lyrica for her meralgia paresthetica with control of her pain.  She had recent gallbladder surgery. She returns for an evaluation.   REVIEW OF SYSTEMS: Full 14 system review of systems performed and notable only for those listed, all others are neg:  Constitutional: neg  Cardiovascular: neg Ear/Nose/Throat: Hard of hearing and does not wear hearing aids Skin: neg Eyes: neg Respiratory: neg Gastroitestinal: Abdominal pain  Hematology/Lymphatic: neg  Endocrine: neg Musculoskeletal:nDegenerative disc disease ALLERGIES/Immunology: neg Neurological: history of headaches in good control Psychiatric:   Sleep :  obstructive sleep apnea with CPAP    ALLERGIES: Allergies  Allergen Reactions  . Topamax [Topiramate] Other (See Comments)    :Stroke like symptoms  . Aleve [Naproxen Sodium] Hives    Has tolerated Voltaren topical as well as aspirin.  . Bee Venom Swelling  . Echinacea Hives  . Other Other (See Comments)    Feathers cause sinus congestion  . Sulfa Antibiotics Hives  . Advil [Ibuprofen] Hives    Has tolerated Voltaren topical as well as aspirin.    HOME  MEDICATIONS: Outpatient Medications Prior to Visit  Medication Sig Dispense Refill  . acetaminophen (TYLENOL) 500 MG tablet Take 800 mg by mouth every 8 (eight) hours as needed for mild pain or headache.     Marland Kitchen aspirin 81 MG tablet Take 81 mg by mouth daily.    . Cholecalciferol (VITAMIN D-3) 1000 UNITS CAPS Take 2,000 Units by mouth every morning.     . citalopram (CELEXA) 20 MG tablet TAKE ONE (1) TABLET BY MOUTH EVERY DAY 30 tablet 2  . CRANBERRY-VITAMIN C PO Take 4,200 mg by mouth every morning.     . eletriptan (RELPAX) 40 MG tablet Take 1 tablet (40 mg total) by mouth every 2 (two) hours as needed for migraine. 8 tablet 5  . loratadine (CLARITIN) 10 MG tablet Take 20 mg by mouth 2 (two) times daily as needed for allergies.     . Lutein-Zeaxanthin 25-5 MG CAPS Take 1 capsule by mouth every morning.     Marland Kitchen LYRICA 200 MG capsule TAKE ONE (1) CAPSULE BY MOUTH EVERY MORNING 30 capsule 2  . Methylcobalamin (METHYL B-12 PO) Take 2,500 mcg by mouth every morning.     . metoprolol succinate (TOPROL-XL) 25 MG 24 hr tablet Take 1 tablet (25 mg total) by mouth daily. 30 tablet 11  . Multiple Vitamin (MULTIVITAMIN WITH MINERALS) TABS Take 1 tablet by mouth at bedtime.     . nitrofurantoin (MACRODANTIN) 100 MG capsule     . nitroGLYCERIN (NITROSTAT) 0.4 MG SL tablet Place 1 tablet (0.4 mg total) under the tongue every 5 (  five) minutes as needed for chest pain. 25 tablet 5  . Omega-3 Fatty Acids (FISH OIL PO) Take 1,200 mg by mouth every morning.     Marland Kitchen oxybutynin (DITROPAN-XL) 10 MG 24 hr tablet Take 10 mg by mouth every morning.     . pantoprazole (PROTONIX) 40 MG tablet TAKE ONE (1) TABLET BY MOUTH TWO (2) TIMES DAILY 60 tablet 5  . ranitidine (ZANTAC) 300 MG capsule Take 1 capsule (300 mg total) by mouth every evening. 30 capsule 11  . simvastatin (ZOCOR) 20 MG tablet TAKE ONE (1) TABLET BY MOUTH EVERY DAY 30 tablet 2  . spironolactone (ALDACTONE) 25 MG tablet TAKE ONE (1) TABLET BY MOUTH EVERY DAY 30  tablet 9  . methocarbamol (ROBAXIN) 500 MG tablet Take 500 mg by mouth every 8 (eight) hours as needed for muscle spasms (Taking once daily).     No facility-administered medications prior to visit.     PAST MEDICAL HISTORY: Past Medical History:  Diagnosis Date  . Anxiety   . Arthritis   . Benign essential HTN 09/23/2014  . Depression   . Dyslipidemia   . Elevated liver enzymes   . Family history of anesthesia complication    father has a severe hard time waking up  . Gallstones    a. Seen on CT 01/2014.  Marland Kitchen GERD (gastroesophageal reflux disease)   . Hard of hearing   . Hepatic steatosis   . History of frequent urinary tract infections   . Hypertension   . Lateral epicondylitis of right elbow   . Mental disorder   . Meralgia paresthetica of right side 10/02/2012   slight at 05/2014  . Migraine headache   . Obesity   . OSA (obstructive sleep apnea)    severe with AHI 37/hr now on CPAP at 18cm H2O  . Osteoarthritis   . Raynaud disease    in feet per patient   . Urinary tract infection     PAST SURGICAL HISTORY: Past Surgical History:  Procedure Laterality Date  . ABDOMINAL HYSTERECTOMY  1/04   partial  . BLADDER SUSPENSION  6/10  . CARDIAC CATHETERIZATION    . carpel tunnel left/right  7/08, 8/08 Bilateral 8/08 and 7/08  . carpel tunnel rel Right 4/12  . CHOLECYSTECTOMY N/A 06/11/2014   Procedure: LAPAROSCOPIC CHOLECYSTECTOMY WITH ATTEMPTED INTRAOPERATIVE CHOLANGIOGRAM;  Surgeon: Jackolyn Confer, MD;  Location: [facility];  Service: General;  Laterality: N/A;  . COLONOSCOPY    . ERCP N/A 10/19/2014   Procedure: ENDOSCOPIC RETROGRADE CHOLANGIOPANCREATOGRAPHY (ERCP);  Surgeon: Ladene Artist, MD;  Location: [facility];  Service: Endoscopy;  Laterality: N/A;  . JOINT REPLACEMENT    . KNEE ARTHROSCOPY Left 12/12  . KNEE ARTHROSCOPY Right 12/06  . LEFT HEART CATHETERIZATION WITH CORONARY ANGIOGRAM N/A 03/10/2014   Procedure: LEFT HEART CATHETERIZATION WITH CORONARY  ANGIOGRAM;  Surgeon: Sinclair Grooms, MD;  Location: [facility] CATH [facility];  Service: Cardiovascular;  Laterality: N/A;  . PLANTAR FASCIA RELEASE Right 12/10  . radial tunnel release     right arm   . ROTATOR CUFF REPAIR Left 6/11  . tennis elbow release Right 7/04  . TOTAL KNEE ARTHROPLASTY Left 09/10/2012   Procedure: TOTAL KNEE ARTHROPLASTY- left;  Surgeon: Garald Balding, MD;  Location: [place];  Service: Orthopedics;  Laterality: Left;  Left total knee arthroplasty    FAMILY HISTORY: Family History  Problem Relation Age of Onset  . Hypertension Mother   . Stroke Mother   . Liver  disease Mother     Abcess  . Hypertension Father   . Coronary artery disease Father   . Migraines Father   . Clotting disorder Father   . Kidney failure Brother   . Hypertension Brother   . Migraines Brother   . Migraines Daughter   . Breast cancer Other     Niece with breast cancer  . Colon cancer Paternal Grandmother   . Pancreatic cancer Paternal Grandmother   . Stomach cancer Paternal Grandmother   . Breast cancer Cousin   . Colon polyps Neg Hx   . Esophageal cancer Neg Hx     SOCIAL HISTORY: Social History   Social History  . Marital status: Married    Spouse name: Remo Lipps  . Number of children: 2  . Years of education: 16   Occupational History  . [employer] History Main Topics  . Smoking status: Never Smoker  . Smokeless tobacco: Never Used     Comment: Secondhand - from family growing up, workplace intermittently  . Alcohol use 0.0 oz/week     Comment: occasionally - intermittent, no more than twice a week  . Drug use: No  . Sexual activity: Not on file   Other Topics Concern  . Not on file   Social History Narrative   Patient is married Remo Lipps) and lives at home with her husband and child.   Patient has two children.   Patient has a college education.   Patient is right-handed.   Patient drinks 1-2 cup of coffee/tea daily.     PHYSICAL  EXAM  Vitals:   10/26/15 0832  BP: 100/61  Pulse: (!) 55  Weight: (!) 311 lb 12.8 oz (141.4 kg)  Height: 5\' 5"  (1.651 m)   Body mass index is 51.89 kg/m. General: The patient is alert and cooperative at the time of the examination. The patient is markedly obese. Skin: No significant peripheral edema is noted.  Neurologic Exam Mental status: The patient is alert and oriented x 3 at the time of the examination. The patient has apparent normal recent and remote memory, with an apparently normal attention span and concentration ability. Cranial nerves: Facial symmetry is present. Speech is normal, no aphasia or dysarthria is noted. Extraocular movements are full. Visual fields are full. Motor: The patient has good strength in all 4 extremities. Sensory examination: Soft touch sensation , pinprick and vibratory is symmetric on the face, arms, and legs. Coordination: The patient has good finger-nose-finger and heel-to-shin bilaterally. Gait and station: The patient has a normal gait. Tandem gait is normal. Romberg is negative. No drift is seen. Reflexes: Deep tendon reflexes are symmetric.    DIAGNOSTIC DATA (LABS, IMAGING, TESTING) - I reviewed patient records, labs, notes, testing and imaging myself where available.  Lab Results  Component Value Date   WBC 5.9 07/28/2015   HGB 14.1 07/28/2015   HCT 41.6 07/28/2015   MCV 89.1 07/28/2015   PLT 173.0 07/28/2015      Component Value Date/Time   NA 142 07/28/2015 0859   K 4.3 07/28/2015 0859   CL 105 07/28/2015 0859   CO2 30 07/28/2015 0859   GLUCOSE 107 (H) 07/28/2015 0859   BUN 14 07/28/2015 0859   CREATININE 0.98 07/28/2015 0859   CALCIUM 9.7 07/28/2015 0859   PROT 6.7 07/28/2015 0859   ALBUMIN 4.0 07/28/2015 0859   AST 19 07/28/2015 0859   ALT 29 07/28/2015 0859   ALKPHOS 57 07/28/2015 0859  BILITOT 0.4 07/28/2015 0859   GFRNONAA >60 09/30/2014 2104   GFRAA >60 09/30/2014 2104     ASSESSMENT AND PLAN  60 y.o.  year old female  has a past medical history of Anxiety;  Arthritis;  Migraine headache; Obesity; Depression;  Meralgia paresthetica of right side (10/02/2012); Hepatic steatosis; Benign essential HTN (09/23/2014);  and OSA (obstructive sleep apnea). Here to follow-up for her migraines and meralgia paresthetica.  Continue Lyrica 200 mg daily will refill Continue Relpax acutely will refill  Continue Toprol daily refilled by Dr. Tamala Julian Follow-up in 8  months Dennie Bible, [facility], [facility], [facility] [address], [place], [place] [address] [phone]

## 2015-11-15 ENCOUNTER — Encounter: Payer: Self-pay | Admitting: Nurse Practitioner

## 2015-11-15 ENCOUNTER — Ambulatory Visit (INDEPENDENT_AMBULATORY_CARE_PROVIDER_SITE_OTHER): Payer: 59 | Admitting: Nurse Practitioner

## 2015-11-15 VITALS — BP 110/64 | HR 66 | Temp 97.6°F | Ht 65.0 in | Wt 312.0 lb

## 2015-11-15 DIAGNOSIS — S46811A Strain of other muscles, fascia and tendons at shoulder and upper arm level, right arm, initial encounter: Secondary | ICD-10-CM

## 2015-11-15 MED ORDER — CYCLOBENZAPRINE HCL 5 MG PO TABS: 5.0000 mg | ORAL_TABLET | Freq: Two times a day (BID) | ORAL | 1 refills | Status: DC | PRN

## 2015-11-15 NOTE — Patient Instructions (Signed)
Return to office in 2weeks in for improvement.  Cervical Sprain A cervical sprain is an injury in the neck in which the strong, fibrous tissues (ligaments) that connect your neck bones stretch or tear. Cervical sprains can range from mild to severe. Severe cervical sprains can cause the neck vertebrae to be unstable. This can lead to damage of the spinal cord and can result in serious nervous system problems. The amount of time it takes for a cervical sprain to get better depends on the cause and extent of the injury. Most cervical sprains heal in 1 to 3 weeks. CAUSES  Severe cervical sprains may be caused by:   Contact sport injuries (such as from football, rugby, wrestling, hockey, auto racing, gymnastics, diving, martial arts, or boxing).   Motor vehicle collisions.   Whiplash injuries. This is an injury from a sudden forward and backward whipping movement of the head and neck.  Falls.  Mild cervical sprains may be caused by:   Being in an awkward position, such as while cradling a telephone between your ear and shoulder.   Sitting in a chair that does not offer proper support.   Working at a poorly Landscape architect station.   Looking up or down for long periods of time.  SYMPTOMS   Pain, soreness, stiffness, or a burning sensation in the front, back, or sides of the neck. This discomfort may develop immediately after the injury or slowly, 24 hours or more after the injury.   Pain or tenderness directly in the middle of the back of the neck.   Shoulder or upper back pain.   Limited ability to move the neck.   Headache.   Dizziness.   Weakness, numbness, or tingling in the hands or arms.   Muscle spasms.   Difficulty swallowing or chewing.   Tenderness and swelling of the neck.  DIAGNOSIS  Most of the time your health care provider can diagnose a cervical sprain by taking your history and doing a physical exam. Your health care provider will ask about  previous neck injuries and any known neck problems, such as arthritis in the neck. X-rays may be taken to find out if there are any other problems, such as with the bones of the neck. Other tests, such as a CT scan or MRI, may also be needed.  TREATMENT  Treatment depends on the severity of the cervical sprain. Mild sprains can be treated with rest, keeping the neck in place (immobilization), and pain medicines. Severe cervical sprains are immediately immobilized. Further treatment is done to help with pain, muscle spasms, and other symptoms and may include:  Medicines, such as pain relievers, numbing medicines, or muscle relaxants.   Physical therapy. This may involve stretching exercises, strengthening exercises, and posture training. Exercises and improved posture can help stabilize the neck, strengthen muscles, and help stop symptoms from returning.  HOME CARE INSTRUCTIONS   Put ice on the injured area.   Put ice in a plastic bag.   Place a towel between your skin and the bag.   Leave the ice on for 15-20 minutes, 3-4 times a day.   If your injury was severe, you may have been given a cervical collar to wear. A cervical collar is a two-piece collar designed to keep your neck from moving while it heals.  Do not remove the collar unless instructed by your health care provider.  If you have long hair, keep it outside of the collar.  Ask your health care  provider before making any adjustments to your collar. Minor adjustments may be required over time to improve comfort and reduce pressure on your chin or on the back of your head.  Ifyou are allowed to remove the collar for cleaning or bathing, follow your health care provider's instructions on how to do so safely.  Keep your collar clean by wiping it with mild soap and water and drying it completely. If the collar you have been given includes removable pads, remove them every 1-2 days and hand wash them with soap and water. Allow  them to air dry. They should be completely dry before you wear them in the collar.  If you are allowed to remove the collar for cleaning and bathing, wash and dry the skin of your neck. Check your skin for irritation or sores. If you see any, tell your health care provider.  Do not drive while wearing the collar.   Only take over-the-counter or prescription medicines for pain, discomfort, or fever as directed by your health care provider.   Keep all follow-up appointments as directed by your health care provider.   Keep all physical therapy appointments as directed by your health care provider.   Make any needed adjustments to your workstation to promote good posture.   Avoid positions and activities that make your symptoms worse.   Warm up and stretch before being active to help prevent problems.  SEEK MEDICAL CARE IF:   Your pain is not controlled with medicine.   You are unable to decrease your pain medicine over time as planned.   Your activity level is not improving as expected.  SEEK IMMEDIATE MEDICAL CARE IF:   You develop any bleeding.  You develop stomach upset.  You have signs of an allergic reaction to your medicine.   Your symptoms get worse.   You develop new, unexplained symptoms.   You have numbness, tingling, weakness, or paralysis in any part of your body.  MAKE SURE YOU:   Understand these instructions.  Will watch your condition.  Will get help right away if you are not doing well or get worse.   This information is not intended to replace advice given to you by your health care provider. Make sure you discuss any questions you have with your health care provider.   Document Released: 01/08/2007 Document Revised: 03/18/2013 Document Reviewed: 09/18/2012 Elsevier Interactive Patient Education [employer].

## 2015-11-15 NOTE — Progress Notes (Signed)
Subjective:  Patient ID: Heather Frederick, female    DOB: Mar 02, 1956  Age: 60 y.o. MRN: [id number]  CC: Neck Pain (RT side, started last night, positive family history of blood clots)   Neck Pain   This is a new problem. The current episode started yesterday. The problem occurs intermittently. The problem has been unchanged. The pain is associated with a sleep position. The pain is present in the right side and occipital region. The quality of the pain is described as cramping and shooting. The pain is at a severity of 5/10 (pain goes up to 8/10 if moves the wrong way). The symptoms are aggravated by twisting. The pain is same all the time. Pertinent negatives include no chest pain, fever, headaches, numbness, paresis, tingling, trouble swallowing or weakness. She has tried acetaminophen for the symptoms. The treatment provided mild relief.    Outpatient Medications Prior to Visit  Medication Sig Dispense Refill  . acetaminophen (TYLENOL) 500 MG tablet Take 800 mg by mouth every 8 (eight) hours as needed for mild pain or headache.     Marland Kitchen aspirin 81 MG tablet Take 81 mg by mouth daily.    . Cholecalciferol (VITAMIN D-3) 1000 UNITS CAPS Take 2,000 Units by mouth every morning.     . citalopram (CELEXA) 20 MG tablet TAKE ONE (1) TABLET BY MOUTH EVERY DAY 30 tablet 2  . CRANBERRY-VITAMIN C PO Take 4,200 mg by mouth every morning.     . eletriptan (RELPAX) 40 MG tablet Take 1 tablet (40 mg total) by mouth every 2 (two) hours as needed for migraine. 8 tablet 8  . loratadine (CLARITIN) 10 MG tablet Take 20 mg by mouth 2 (two) times daily as needed for allergies.     . Lutein-Zeaxanthin 25-5 MG CAPS Take 1 capsule by mouth every morning.     . Methylcobalamin (METHYL B-12 PO) Take 2,500 mcg by mouth every morning.     . metoprolol succinate (TOPROL-XL) 25 MG 24 hr tablet Take 1 tablet (25 mg total) by mouth daily. 30 tablet 11  . Multiple Vitamin (MULTIVITAMIN WITH MINERALS) TABS Take 1 tablet by mouth  at bedtime.     . nitrofurantoin (MACRODANTIN) 100 MG capsule     . nitroGLYCERIN (NITROSTAT) 0.4 MG SL tablet Place 1 tablet (0.4 mg total) under the tongue every 5 (five) minutes as needed for chest pain. 25 tablet 5  . Omega-3 Fatty Acids (FISH OIL PO) Take 1,200 mg by mouth every morning.     Marland Kitchen oxybutynin (DITROPAN-XL) 10 MG 24 hr tablet Take 10 mg by mouth every morning.     . pantoprazole (PROTONIX) 40 MG tablet TAKE ONE (1) TABLET BY MOUTH TWO (2) TIMES DAILY 60 tablet 5  . pregabalin (LYRICA) 200 MG capsule TAKE ONE (1) CAPSULE BY MOUTH EVERY MORNING 30 capsule 5  . ranitidine (ZANTAC) 300 MG capsule Take 1 capsule (300 mg total) by mouth every evening. 30 capsule 11  . simvastatin (ZOCOR) 20 MG tablet TAKE ONE (1) TABLET BY MOUTH EVERY DAY 30 tablet 2  . spironolactone (ALDACTONE) 25 MG tablet TAKE ONE (1) TABLET BY MOUTH EVERY DAY 30 tablet 9   No facility-administered medications prior to visit.     ROS Review of Systems  Constitutional: Negative for fever.  HENT: Negative for trouble swallowing.   Cardiovascular: Negative for chest pain.  Musculoskeletal: Positive for neck pain.  Neurological: Negative for tingling, weakness, numbness and headaches.    Objective:  BP 110/64 (  BP Location: Left Arm, Patient Position: Sitting, Cuff Size: Large)   Pulse 66   Temp 97.6 F (36.4 C) (Oral)   Ht 5\' 5"  (1.651 m)   Wt (!) 312 lb (141.5 kg)   SpO2 95%   BMI 51.92 kg/m   BP Readings from Last 3 Encounters:  11/15/15 110/64  10/26/15 100/61  08/27/15 118/68    Wt Readings from Last 3 Encounters:  11/15/15 (!) 312 lb (141.5 kg)  10/26/15 (!) 311 lb 12.8 oz (141.4 kg)  08/27/15 (!) 307 lb 12.8 oz (139.6 kg)    Physical Exam  Constitutional: She is oriented to person, place, and time.  Neck: Normal range of motion. Neck supple.  Cardiovascular: Normal rate and normal heart sounds.   Pulmonary/Chest: Effort normal and breath sounds normal.  Musculoskeletal: Normal range  of motion. She exhibits tenderness. She exhibits no edema.       Right shoulder: Normal.       Right elbow: Normal.      Right wrist: Normal.       Cervical back: Normal.       Right upper arm: Normal.       Right forearm: Normal.       Right hand: Normal.  Tenderness along right upper trapezius muscle, no palpable spasm.  Lymphadenopathy:    She has no cervical adenopathy.  Neurological: She is alert and oriented to person, place, and time.  Skin: Skin is warm and dry. No rash noted.    Lab Results  Component Value Date   WBC 5.9 07/28/2015   HGB 14.1 07/28/2015   HCT 41.6 07/28/2015   PLT 173.0 07/28/2015   GLUCOSE 107 (H) 07/28/2015   CHOL 146 07/28/2015   TRIG 133.0 07/28/2015   HDL 31.30 (L) 07/28/2015   LDLCALC 88 07/28/2015   ALT 29 07/28/2015   AST 19 07/28/2015   NA 142 07/28/2015   K 4.3 07/28/2015   CL 105 07/28/2015   CREATININE 0.98 07/28/2015   BUN 14 07/28/2015   CO2 30 07/28/2015   INR 1.10 06/09/2014   HGBA1C 5.9 07/28/2015     Assessment & Plan:   Consider trigger point injection if no improvement in 2weeks/ Patient unable to take any NSAID due to allergy profile.  Heather Frederick was seen today for neck pain.  Diagnoses and all orders for this visit:  Strain of right trapezius muscle, initial encounter -     cyclobenzaprine (FLEXERIL) 5 MG tablet; Take 1 tablet (5 mg total) by mouth 2 (two) times daily as needed for muscle spasms.   I am having Heather Frederick start on cyclobenzaprine. I am also having her maintain her loratadine, multivitamin with minerals, Lutein-Zeaxanthin, oxybutynin, Vitamin D-3, CRANBERRY-VITAMIN C PO, Methylcobalamin (METHYL B-12 PO), Omega-3 Fatty Acids (FISH OIL PO), ranitidine, acetaminophen, aspirin, metoprolol succinate, pantoprazole, nitroGLYCERIN, nitrofurantoin, spironolactone, simvastatin, citalopram, eletriptan, and pregabalin.  Meds ordered this encounter  Medications  . cyclobenzaprine (FLEXERIL) 5 MG tablet    Sig: Take  1 tablet (5 mg total) by mouth 2 (two) times daily as needed for muscle spasms.    Dispense:  14 tablet    Refill:  1    Order Specific Question:   Supervising Provider    Answer:   Cassandria Anger [[id number]]     Follow-up: Return if symptoms worsen or fail to improve.  Wilfred Lacy, NP

## 2015-11-15 NOTE — Progress Notes (Signed)
Pre visit review using our clinic review tool, if applicable. No additional management support is needed unless otherwise documented below in the visit note. 

## 2015-11-30 ENCOUNTER — Other Ambulatory Visit: Payer: Self-pay | Admitting: Gastroenterology

## 2015-12-15 ENCOUNTER — Telehealth: Payer: Self-pay | Admitting: Physician Assistant

## 2015-12-15 ENCOUNTER — Encounter: Payer: Self-pay | Admitting: Physician Assistant

## 2015-12-15 ENCOUNTER — Observation Stay: Admission: AD | Admit: 2015-12-15 | Payer: 59 | Source: Ambulatory Visit | Admitting: Cardiology

## 2015-12-15 ENCOUNTER — Telehealth: Payer: Self-pay | Admitting: Interventional Cardiology

## 2015-12-15 ENCOUNTER — Ambulatory Visit (INDEPENDENT_AMBULATORY_CARE_PROVIDER_SITE_OTHER): Payer: 59 | Admitting: Physician Assistant

## 2015-12-15 VITALS — BP 102/68 | HR 62 | Ht 65.0 in | Wt 302.4 lb

## 2015-12-15 DIAGNOSIS — G4733 Obstructive sleep apnea (adult) (pediatric): Secondary | ICD-10-CM

## 2015-12-15 DIAGNOSIS — R072 Precordial pain: Secondary | ICD-10-CM | POA: Diagnosis not present

## 2015-12-15 DIAGNOSIS — I5032 Chronic diastolic (congestive) heart failure: Secondary | ICD-10-CM | POA: Diagnosis not present

## 2015-12-15 DIAGNOSIS — I10 Essential (primary) hypertension: Secondary | ICD-10-CM

## 2015-12-15 LAB — TROPONIN I: Troponin I: <0.03 ng/mL (ref <0.03)

## 2015-12-15 MED ORDER — AMLODIPINE BESYLATE 5 MG PO TABS: 5.0000 mg | ORAL_TABLET | Freq: Every day | ORAL | 3 refills | Status: DC

## 2015-12-15 NOTE — Telephone Encounter (Signed)
New message    Pharmacist calling about a rx for amlodipine.  Heather Frederick want to verify the mg. Please call.

## 2015-12-15 NOTE — Telephone Encounter (Signed)
Follow up  Pt voiced she is calling because she was informed someone would call her an hour after hr appt about her blood work  Please f/u with pt

## 2015-12-15 NOTE — Progress Notes (Signed)
Cardiology Office Note:    Date:  12/15/2015   ID:  Heather Frederick, DOB [birth year], MRN [id number]  PCP:  Hoyt Koch, MD  Cardiologist:  Dr. Daneen Schick   Electrophysiologist:  n/a  Referring MD: Hoyt Koch, *   Chief Complaint  Patient presents with  . Chest Pain   History of Present Illness:    Heather Frederick is a 60 y.o. female with a hx of Obesity, diastolic heart failure, OSA, HL, HTN. Cardiac catheterization 12/15 demonstrated normal coronary arteries. She had a cholecystectomy in 3/16 and then a bile duct stone was removed at [facility] in 7/16.  Last seen by Dr. Tamala Julian 3/17. She called in today with compressive chest pain.  Recommendation was to proceed to the ED. She declined and was added on to my schedule.  She was in her USOH until last night when she started having substernal chest pain.  She went to bed and the pain recurred this AM. It has been present all day but did get much worse around 10 am.  She took 1 NTG and the pain eased some.  She has had some L neck pain as well but has had some issues with her shoulder. She denies associated dyspnea, nausea, diaphoresis.  She denies pleuritic chest pain.  She denies CP assoc with meals.  She denies orthopnea, PND, edema.  She denies syncope.    Prior CV studies that were reviewed today include:    LHC 03/10/14 The left main coronary artery is normal. LAD is normal. It wraps around the left ventricular apex. It gives origin to a large first diagonal which is also normal.. LCx The vessel is normal.. RCA is normal.. LEFT VENTRICULOGRAM:  Left ventricular angiogram was done in the 30 RAO projection and revealed normal cavity size with normal contractility. EF 60%.  IMPRESSIONS:  1. Normal coronary arteries 2. Normal systolic function with EF 60%. Elevated end-diastolic pressures compatible with diastolic heart failure.  Myoview 01/27/14 Overall Impression:  Low risk stress nuclear study with anterior breast  attenuation artifact and possible small area of inferoapical ischemia. Technically limited study.Study specificity is limited by significant interference from infradiaphragmatic visceral uptake, especially on rest images.  LV Ejection Fraction: 61%.  LV Wall Motion:  NL LV Function; NL Wall Motion   Echo 11/10/13 Mild concentric LVH, EF 55-60%, mild LAE  Past Medical History:  Diagnosis Date  . Anxiety   . Arthritis   . Benign essential HTN 09/23/2014  . Depression   . Dyslipidemia   . Elevated liver enzymes   . Family history of anesthesia complication    father has a severe hard time waking up  . Gallstones    a. Seen on CT 01/2014.  Marland Kitchen GERD (gastroesophageal reflux disease)   . Hard of hearing   . Hepatic steatosis   . History of frequent urinary tract infections   . Hypertension   . Lateral epicondylitis of right elbow   . Mental disorder   . Meralgia paresthetica of right side 10/02/2012   slight at 05/2014  . Migraine headache   . Obesity   . OSA (obstructive sleep apnea)    severe with AHI 37/hr now on CPAP at 18cm H2O  . Osteoarthritis   . Raynaud disease    in feet per patient   . Urinary tract infection     Past Surgical History:  Procedure Laterality Date  . ABDOMINAL HYSTERECTOMY  1/04   partial  . BLADDER SUSPENSION  6/10  . CARDIAC CATHETERIZATION    . carpel tunnel left/right  7/08, 8/08 Bilateral 8/08 and 7/08  . carpel tunnel rel Right 4/12  . CHOLECYSTECTOMY N/A 06/11/2014   Procedure: LAPAROSCOPIC CHOLECYSTECTOMY WITH ATTEMPTED INTRAOPERATIVE CHOLANGIOGRAM;  Surgeon: Jackolyn Confer, MD;  Location: [facility];  Service: General;  Laterality: N/A;  . COLONOSCOPY    . [facility] N/A 10/19/2014   Procedure: ENDOSCOPIC RETROGRADE CHOLANGIOPANCREATOGRAPHY ([facility]);  Surgeon: Ladene Artist, MD;  Location: [facility];  Service: Endoscopy;  Laterality: N/A;  . JOINT REPLACEMENT    . KNEE ARTHROSCOPY Left 12/12  . KNEE ARTHROSCOPY Right 12/06  . LEFT HEART  CATHETERIZATION WITH CORONARY ANGIOGRAM N/A 03/10/2014   Procedure: LEFT HEART CATHETERIZATION WITH CORONARY ANGIOGRAM;  Surgeon: Sinclair Grooms, MD;  Location: [facility] CATH LAB;  Service: Cardiovascular;  Laterality: N/A;  . PLANTAR FASCIA RELEASE Right 12/10  . radial tunnel release     right arm   . ROTATOR CUFF REPAIR Left 6/11  . tennis elbow release Right 7/04  . TOTAL KNEE ARTHROPLASTY Left 09/10/2012   Procedure: TOTAL KNEE ARTHROPLASTY- left;  Surgeon: Garald Balding, MD;  Location: [facility];  Service: Orthopedics;  Laterality: Left;  Left total knee arthroplasty    Current Medications: Outpatient Medications Prior to Visit  Medication Sig Dispense Refill  . acetaminophen (TYLENOL) 500 MG tablet Take 800 mg by mouth every 8 (eight) hours as needed for mild pain or headache.     Marland Kitchen aspirin 81 MG tablet Take 81 mg by mouth daily.    . Cholecalciferol (VITAMIN D-3) 1000 UNITS CAPS Take 2,000 Units by mouth every morning.     . citalopram (CELEXA) 20 MG tablet TAKE ONE (1) TABLET BY MOUTH EVERY DAY 30 tablet 2  . CRANBERRY-VITAMIN C PO Take 4,200 mg by mouth every morning.     . eletriptan (RELPAX) 40 MG tablet Take 1 tablet (40 mg total) by mouth every 2 (two) hours as needed for migraine. 8 tablet 8  . loratadine (CLARITIN) 10 MG tablet Take 10 mg by mouth 2 (two) times daily as needed for allergies.     . Lutein-Zeaxanthin 25-5 MG CAPS Take 1 capsule by mouth every morning.     . Methylcobalamin (METHYL B-12 PO) Take 2,500 mcg by mouth every morning.     . Multiple Vitamin (MULTIVITAMIN WITH MINERALS) TABS Take 1 tablet by mouth daily.     . nitrofurantoin (MACRODANTIN) 100 MG capsule Take 100 mg by mouth daily.     . nitroGLYCERIN (NITROSTAT) 0.4 MG SL tablet Place 1 tablet (0.4 mg total) under the tongue every 5 (five) minutes as needed for chest pain. 25 tablet 5  . Omega-3 Fatty Acids (FISH OIL PO) Take 1,200 mg by mouth every morning.     Marland Kitchen oxybutynin (DITROPAN-XL) 10 MG 24 hr  tablet Take 10 mg by mouth every morning.     . pantoprazole (PROTONIX) 40 MG tablet TAKE ONE (1) TABLET BY MOUTH TWO (2) TIMES DAILY 60 tablet 5  . pregabalin (LYRICA) 200 MG capsule TAKE ONE (1) CAPSULE BY MOUTH EVERY MORNING 30 capsule 5  . ranitidine (ZANTAC) 300 MG tablet TAKE ONE TABLET BY MOUTH EVERY EVENING 30 tablet 0  . simvastatin (ZOCOR) 20 MG tablet TAKE ONE (1) TABLET BY MOUTH EVERY DAY 30 tablet 2  . spironolactone (ALDACTONE) 25 MG tablet TAKE ONE (1) TABLET BY MOUTH EVERY DAY 30 tablet 9  . metoprolol succinate (TOPROL-XL) 25 MG 24 hr tablet  Take 1 tablet (25 mg total) by mouth daily. 30 tablet 11  . cyclobenzaprine (FLEXERIL) 5 MG tablet Take 1 tablet (5 mg total) by mouth 2 (two) times daily as needed for muscle spasms. (Patient not taking: Reported on 12/15/2015) 14 tablet 1   No facility-administered medications prior to visit.       Allergies:   Topamax [topiramate]; Aleve [naproxen sodium]; Bee venom; Echinacea; Other; Sulfa antibiotics; and Advil [ibuprofen]   Social History   Social History  . Marital status: Married    Spouse name: Remo Lipps  . Number of children: 2  . Years of education: 16   Occupational History  . [place] History Main Topics  . Smoking status: Never Smoker  . Smokeless tobacco: Never Used     Comment: Secondhand - from family growing up, workplace intermittently  . Alcohol use 0.0 oz/week     Comment: occasionally - intermittent, no more than twice a week  . Drug use: No  . Sexual activity: Not Asked   Other Topics Concern  . None   Social History Narrative   Patient is married Remo Lipps) and lives at home with her husband and child.   Patient has two children.   Patient has a college education.   Patient is right-handed.   Patient drinks 1-2 cup of coffee/tea daily.     Family History:  The patient's family history includes Breast cancer in her cousin and other; Clotting disorder in her father; Colon  cancer in her paternal grandmother; Coronary artery disease in her father; Hypertension in her brother, father, and mother; Kidney failure in her brother; Liver disease in her mother; Migraines in her brother, daughter, and father; Pancreatic cancer in her paternal grandmother; Stomach cancer in her paternal grandmother; Stroke in her mother.   ROS:   Please see the history of present illness.    Review of Systems  Cardiovascular: Positive for chest pain.  Respiratory: Positive for cough.   Gastrointestinal: Positive for abdominal pain.   All other systems reviewed and are negative.   EKGs/Labs/Other Test Reviewed:    EKG:  EKG is  ordered today.  The ekg ordered today demonstrates NSR, HR 62, normal axis, QTC 375 ms, no change from prior tracing  Recent Labs: 07/28/2015: ALT 29; BUN 14; Creatinine, Ser 0.98; Hemoglobin 14.1; Platelets 173.0; Potassium 4.3; Sodium 142   Recent Lipid Panel    Component Value Date/Time   CHOL 146 07/28/2015 0859   TRIG 133.0 07/28/2015 0859   HDL 31.30 (L) 07/28/2015 0859   CHOLHDL 5 07/28/2015 0859   VLDL 26.6 07/28/2015 0859   LDLCALC 88 07/28/2015 0859     Physical Exam:    VS:  BP 102/68   Pulse 62   Ht 5\' 5"  (1.651 m)   Wt (!) 302 lb 6.4 oz (137.2 kg)   BMI 50.32 kg/m     Wt Readings from Last 3 Encounters:  12/15/15 (!) 302 lb 6.4 oz (137.2 kg)  11/15/15 (!) 312 lb (141.5 kg)  10/26/15 (!) 311 lb 12.8 oz (141.4 kg)     Physical Exam  Constitutional: She is oriented to person, place, and time. She appears well-developed and well-nourished. No distress.  HENT:  Head: Normocephalic and atraumatic.  Eyes: No scleral icterus.  Neck: No JVD present.  Cardiovascular: Normal rate, regular rhythm and normal heart sounds.   No murmur heard. Pulmonary/Chest: Effort normal. She has no wheezes. She has no rales.  Abdominal: Soft.  There is no tenderness.  Musculoskeletal: She exhibits no edema.  Neurological: She is alert and oriented to  person, place, and time.  Skin: Skin is warm and dry.  Psychiatric: She has a normal mood and affect.    ASSESSMENT:    1. Precordial chest pain   2. Chronic diastolic CHF (congestive heart failure) ([place])   3. OSA (obstructive sleep apnea)   4. Benign essential HTN    PLAN:    In order of problems listed above:  1. Chest pain - She has a hx of normal coronary arteries at [facility] in 2015.  She had a hx of chest pain that seemed to improve after GB surgery and removal of a CBD stone.  Her ECG is normal.  She has had CP all day. She did have improvement with NTG.  This may be from esophageal spasm.  I reviewed her case with Dr. Ena Dawley ([facility]).  Will get a stat Troponin.  If this is normal, with hours of chest pain, she will not need further cardiac testing.  If it is elevated, she will be sent to the ED.  I will DC her Toprol and start her on Amlodipine to cover for coronary vasospasm as well as esophageal spasm.  Arrange 2 week follow up.  Consider referral back to GI.  She knows to go to the ED if her chest pain worsens.   2. Chronic diastolic CHF - Volume stable.  Continue current Rx.  3. OSA - On CPAP.  FU with Dr. Fransico Him as planned.   4. HTN - BP running somewhat low after NTG. I asked her to push fluids and add some salt to her diet today to help get it up some.   Medication Adjustments/Labs and Tests Ordered: Current medicines are reviewed at length with the patient today.  Concerns regarding medicines are outlined above.  Medication changes, Labs and Tests ordered today are outlined in the Patient Instructions noted below. Patient Instructions  Medication Instructions:  1. STOP TOPROL XL  2. START AMLODIPINE 2.5 MG DAILY; NEW RX HAS BEEN SENT IN  Labwork: 1. STAT TROPONIN  Testing/Procedures: NONE  Follow-Up: 01/03/16 @ 9:15 with Richardson Dopp, PAC   Any Other Special Instructions Will Be Listed Below (If Applicable).  If you need a refill on your cardiac  medications before your next appointment, please call your pharmacy.  Signed, Richardson Dopp, PA-C  12/15/2015 3:07 PM    [facility] [place], [place], [place]  [address] Phone: [phone]; Fax: [phone]

## 2015-12-15 NOTE — Telephone Encounter (Signed)
New message      Pt c/o of Chest Pain: STAT if CP now or developed within 24 hours  1. Are you having CP right now?  Yes, but very light 2. Are you experiencing any other symptoms (ex. SOB, nausea, vomiting, sweating)?  no  3. How long have you been experiencing CP?  Started last night  4. Is your CP continuous or coming and going?  continuous 5. Have you taken Nitroglycerin? Yes---took it about 10-10:30 this am ?

## 2015-12-15 NOTE — Telephone Encounter (Signed)
Pt notified of normal Troponin by phone with verbal understanding. Pt advised to then f/u with PCP or GI Since Troponin was normal. Pt agreeable to plan of care.

## 2015-12-15 NOTE — Patient Instructions (Addendum)
Medication Instructions:  1. STOP TOPROL XL  2. START AMLODIPINE 2.5 MG DAILY; NEW RX HAS BEEN SENT IN  Labwork: 1. STAT TROPONIN  Testing/Procedures: NONE  Follow-Up: 01/03/16 @ 9:15 with Richardson Dopp, PAC   Any Other Special Instructions Will Be Listed Below (If Applicable).  If you need a refill on your cardiac medications before your next appointment, please call your pharmacy.

## 2015-12-15 NOTE — Telephone Encounter (Signed)
Calling stating that she had some slight CP last PM but resolved.  At 10:00 this AM pain became very intense.  She took a NTG with some relief.  She states she has faint pain now, neck feels tight and some discomfort in her shoulder.  States she did become light headed with NTG.  States she also has had some GB issues even though she has had GB removed.  States the surgeon had to go back into area due to gall stone. States it is hard for her distinguish between GB discomfort and CP.  She had a cath in 2015 by Dr. Tamala Julian that showed normal coronaries. Spoke w/[facility] who suggested that she go to ER.  When called her back she states the pain has resolved and didn't really want to go to ER. Wanted to know if someone could see her today.  Gave her an appointment with Margaret Pyle at 2:15.  She will be at the appointment.

## 2015-12-16 ENCOUNTER — Telehealth: Payer: Self-pay | Admitting: Physician Assistant

## 2015-12-16 ENCOUNTER — Encounter: Payer: Self-pay | Admitting: Physician Assistant

## 2015-12-16 DIAGNOSIS — R072 Precordial pain: Secondary | ICD-10-CM

## 2015-12-16 NOTE — Telephone Encounter (Signed)
Ordered Medications    Disp Refills Start End  amLODipine (NORVASC) 5 MG tablet 90 tablet 3 12/15/2015 03/14/2016  Take 1 tablet (5 mg total) by mouth daily. - Oral   Medication Adjustments/Labs and Tests Ordered: Current medicines are reviewed at length with the patient today.  Concerns regarding medicines are outlined above.  Medication changes, Labs and Tests ordered today are outlined in the Patient Instructions noted below. Patient Instructions  Medication Instructions:  1. STOP TOPROL XL  2. START AMLODIPINE 2.5 MG DAILY; NEW RX HAS BEEN SENT IN  Labwork: 1. STAT TROPONIN  Testing/Procedures: NONE  Follow-Up: 01/03/16 @ 9:15 with Richardson Dopp, PAC   Any Other Special Instructions Will Be Listed Below (If Applicable).  If you need a refill on your cardiac medications before your next appointment, please call your pharmacy.  Signed, Richardson Dopp, PA-C  12/15/2015 3:07 PM    [facility] [place], [place], [place]  [address] Phone: [phone]; Fax: [phone]

## 2015-12-16 NOTE — Telephone Encounter (Signed)
New message       Pt c/o medication issue:  1. Name of Medication: amlodipine 2. How are you currently taking this medication (dosage and times per day)? Is she to take 5mg  or 2.5mg ?  3. Are you having a reaction (difficulty breathing--STAT)? no  4. What is your medication issue? Calling to get clarification on dosage and instructions

## 2015-12-17 ENCOUNTER — Telehealth: Payer: Self-pay | Admitting: Physician Assistant

## 2015-12-17 MED ORDER — AMLODIPINE BESYLATE 2.5 MG PO TABS: 2.5000 mg | ORAL_TABLET | Freq: Every day | ORAL | 3 refills | Status: DC

## 2015-12-17 NOTE — Telephone Encounter (Signed)
I sent in a new Rx for the Amlodipine. Rx should had read 2.5 mg daily. Med list reflects corrected dose.

## 2015-12-17 NOTE — Telephone Encounter (Signed)
Pt aware new Rx sent in for Amlodipine 2.5 mg tablet. I apologized for the error. Pt said no problem just wanted to make sure she knew the right dose.

## 2015-12-17 NOTE — Telephone Encounter (Signed)
New message      Pt c/o medication issue:  1. Name of Medication: norvasc  2. How are you currently taking this medication (dosage and times per day)? 2.5mg  daily 3. Are you having a reaction (difficulty breathing--STAT)? no  4. What is your medication issue? Pt was told to take 2.5mg  daily.  She said the pharmacy said the presc was called in for 5mg  daily.  Please call and let her know which dosage she is to be taking and also call [facility]

## 2015-12-20 ENCOUNTER — Ambulatory Visit: Payer: 59 | Admitting: Physician Assistant

## 2015-12-30 ENCOUNTER — Other Ambulatory Visit (INDEPENDENT_AMBULATORY_CARE_PROVIDER_SITE_OTHER): Payer: 59

## 2015-12-30 ENCOUNTER — Encounter: Payer: Self-pay | Admitting: Physician Assistant

## 2015-12-30 ENCOUNTER — Ambulatory Visit (INDEPENDENT_AMBULATORY_CARE_PROVIDER_SITE_OTHER): Payer: 59 | Admitting: Physician Assistant

## 2015-12-30 VITALS — BP 104/70 | HR 78 | Ht 65.0 in | Wt 299.0 lb

## 2015-12-30 DIAGNOSIS — R0789 Other chest pain: Secondary | ICD-10-CM | POA: Diagnosis not present

## 2015-12-30 DIAGNOSIS — K219 Gastro-esophageal reflux disease without esophagitis: Secondary | ICD-10-CM

## 2015-12-30 DIAGNOSIS — R109 Unspecified abdominal pain: Secondary | ICD-10-CM | POA: Diagnosis not present

## 2015-12-30 DIAGNOSIS — R194 Change in bowel habit: Secondary | ICD-10-CM | POA: Diagnosis not present

## 2015-12-30 LAB — CBC WITH DIFFERENTIAL/PLATELET
Basophils Absolute: 0 10*3/uL (ref 0.0–0.1)
Basophils Relative: 0.6 % (ref 0.0–3.0)
Eosinophils Absolute: 0.2 10*3/uL (ref 0.0–0.7)
Eosinophils Relative: 2.2 % (ref 0.0–5.0)
HCT: 43.6 % (ref 36.0–46.0)
Hemoglobin: 15 g/dL (ref 12.0–15.0)
Lymphocytes Relative: 39.3 % (ref 12.0–46.0)
Lymphs Abs: 2.7 10*3/uL (ref 0.7–4.0)
MCHC: 34.5 g/dL (ref 30.0–36.0)
MCV: 88.9 fl (ref 78.0–100.0)
Monocytes Absolute: 0.7 10*3/uL (ref 0.1–1.0)
Monocytes Relative: 10.7 % (ref 3.0–12.0)
Neutro Abs: 3.3 10*3/uL (ref 1.4–7.7)
Neutrophils Relative %: 47.2 % (ref 43.0–77.0)
Platelets: 186 10*3/uL (ref 150.0–400.0)
RBC: 4.91 Mil/uL (ref 3.87–5.11)
RDW: 13.2 % (ref 11.5–15.5)
WBC: 7 10*3/uL (ref 4.0–10.5)

## 2015-12-30 LAB — COMPREHENSIVE METABOLIC PANEL WITH GFR
ALT: 30 U/L (ref 0–35)
AST: 20 U/L (ref 0–37)
Albumin: 4 g/dL (ref 3.5–5.2)
Alkaline Phosphatase: 71 U/L (ref 39–117)
BUN: 15 mg/dL (ref 6–23)
CO2: 30 meq/L (ref 19–32)
Calcium: 9.2 mg/dL (ref 8.4–10.5)
Chloride: 104 meq/L (ref 96–112)
Creatinine, Ser: 0.91 mg/dL (ref 0.40–1.20)
GFR: 67.44 mL/min
Glucose, Bld: 97 mg/dL (ref 70–99)
Potassium: 4 meq/L (ref 3.5–5.1)
Sodium: 141 meq/L (ref 135–145)
Total Bilirubin: 0.6 mg/dL (ref 0.2–1.2)
Total Protein: 7.2 g/dL (ref 6.0–8.3)

## 2015-12-30 MED ORDER — HYOSCYAMINE SULFATE 0.125 MG SL SUBL: 0.1250 mg | SUBLINGUAL_TABLET | SUBLINGUAL | 1 refills | Status: DC | PRN

## 2015-12-30 NOTE — Progress Notes (Signed)
Reviewed and agree with initial management plan.  Malcolm T. Stark, MD FACG 

## 2015-12-30 NOTE — Progress Notes (Signed)
Chief Complaint: Atypical Chest Pain, Lower abdominal cramping  HPI:  Heather Frederick is a 60 year old Caucasian female, with past medical history of GERD, hepatic steatosis, hypertension, depression, dyslipidemia and status post ERCP and cholecystectomy last year, who was referred to me by Hoyt Koch, * for a complaint of atypical chest pain and lower abdominal cramping. The patient has followed with Dr. Fuller Plan in the past.  Patient's last colonoscopy was performed on 05/05/11 at [facility]. Independent review of report and images showed 2 polyps and diverticulosis at this time. Pathology returned hyperplastic polyps. She also had an EGD which showed a moderately sized hiatal hernia and was otherwise normal.  Per chart review patient's last seen in clinic on 11/09/2014,  and at that time was following up after recent ERCP with sphincterotomy and balloon stone extraction on 10/19/2014 for a single common bile duct stone and mild cholangitis. At that time she described infrequent episodes of mild midsternal chest pain and occasional sensation of a lump in her throat. At that time she was continued on Pantoprazole 40 mg twice a day and Ranitidine 40 mg at bedtime and was told to intensify antireflux measures and begin the use of 4 inch bed blocks. Weight loss was recommended.  Today, the patient explains that ever since she had her procedures last year for her gallbladder she has been experiencing intermittent chest pain/epigastric pain. She tells me that this happens 2-3 times per month. The last time she can recall was 2 weeks ago when she felt the pain/pressure radiate up into her chest, this lasted for 3 hours. The patient did take a Nitro-Tab and thought that it "may have helped a little", so she proceeded to her cardiologist the next day who did labs including a troponin and EKG which were normal. They told her likely this was not cardiac in origin. She has not had another episode since. She decided  that it was possibly GI related.  Patient also describes that since her procedures last year she feels like her "stomach has been wrecked". She notes that she will occasionally have a regular stool but every third day she develops a lower abdominal cramping pain and ends up in the bathroom with first a solid stool and then "violent diarrhea". She makes about 3 trips back to the toilet before this is over. She tells me that this is very urgent in nature and "doesn't give her a lot of warning". The patient has tried to watch her diet to see if eating high fat or spicy foods seems to make a difference and she has not been able to relate this.  Patient denies fever, chills, blood in her stool, melena, weight loss, fatigue, Nexium, nausea, vomiting, heartburn, reflux or dysphagia.   Past Medical History:  Diagnosis Date  . Anxiety   . Arthritis   . Benign essential HTN 09/23/2014  . Depression   . Dyslipidemia   . Elevated liver enzymes   . Family history of anesthesia complication    father has a severe hard time waking up  . Gallstones    a. Seen on CT 01/2014.  Marland Kitchen GERD (gastroesophageal reflux disease)   . Hard of hearing   . Hepatic steatosis   . History of frequent urinary tract infections   . Hypertension   . Lateral epicondylitis of right elbow   . Mental disorder   . Meralgia paresthetica of right side 10/02/2012   slight at 05/2014  . Migraine headache   .  Obesity   . OSA (obstructive sleep apnea)    severe with AHI 37/hr now on CPAP at 18cm H2O  . Osteoarthritis   . Raynaud disease    in feet per patient   . Urinary tract infection     Past Surgical History:  Procedure Laterality Date  . ABDOMINAL HYSTERECTOMY  1/04   partial  . BLADDER SUSPENSION  6/10  . CARDIAC CATHETERIZATION    . carpel tunnel left/right  7/08, 8/08 Bilateral 8/08 and 7/08  . carpel tunnel rel Right 4/12  . CHOLECYSTECTOMY N/A 06/11/2014   Procedure: LAPAROSCOPIC CHOLECYSTECTOMY WITH ATTEMPTED  INTRAOPERATIVE CHOLANGIOGRAM;  Surgeon: Jackolyn Confer, MD;  Location: [facility];  Service: General;  Laterality: N/A;  . COLONOSCOPY    . ERCP N/A 10/19/2014   Procedure: ENDOSCOPIC RETROGRADE CHOLANGIOPANCREATOGRAPHY (ERCP);  Surgeon: Ladene Artist, MD;  Location: [facility];  Service: Endoscopy;  Laterality: N/A;  . JOINT REPLACEMENT    . KNEE ARTHROSCOPY Left 12/12  . KNEE ARTHROSCOPY Right 12/06  . LEFT HEART CATHETERIZATION WITH CORONARY ANGIOGRAM N/A 03/10/2014   Procedure: LEFT HEART CATHETERIZATION WITH CORONARY ANGIOGRAM;  Surgeon: Sinclair Grooms, MD;  Location: [facility];  Service: Cardiovascular;  Laterality: N/A;  . PLANTAR FASCIA RELEASE Right 12/10  . radial tunnel release     right arm   . ROTATOR CUFF REPAIR Left 6/11  . tennis elbow release Right 7/04  . TOTAL KNEE ARTHROPLASTY Left 09/10/2012   Procedure: TOTAL KNEE ARTHROPLASTY- left;  Surgeon: Garald Balding, MD;  Location: [facility];  Service: Orthopedics;  Laterality: Left;  Left total knee arthroplasty    Current Outpatient Prescriptions  Medication Sig Dispense Refill  . acetaminophen (TYLENOL) 500 MG tablet Take 800 mg by mouth every 8 (eight) hours as needed for mild pain or headache.     Marland Kitchen amLODipine (NORVASC) 2.5 MG tablet Take 1 tablet (2.5 mg total) by mouth daily. 90 tablet 3  . aspirin 81 MG tablet Take 81 mg by mouth daily.    . Cholecalciferol (VITAMIN D-3) 1000 UNITS CAPS Take 2,000 Units by mouth every morning.     . citalopram (CELEXA) 20 MG tablet TAKE ONE (1) TABLET BY MOUTH EVERY DAY 30 tablet 2  . CRANBERRY-VITAMIN C PO Take 4,200 mg by mouth every morning.     . eletriptan (RELPAX) 40 MG tablet Take 1 tablet (40 mg total) by mouth every 2 (two) hours as needed for migraine. 8 tablet 8  . loratadine (CLARITIN) 10 MG tablet Take 10 mg by mouth 2 (two) times daily as needed for allergies.     . Lutein-Zeaxanthin 25-5 MG CAPS Take 1 capsule by mouth every morning.     . Methylcobalamin (METHYL  B-12 PO) Take 2,500 mcg by mouth every morning.     . Multiple Vitamin (MULTIVITAMIN WITH MINERALS) TABS Take 1 tablet by mouth daily.     . nitrofurantoin (MACRODANTIN) 100 MG capsule Take 100 mg by mouth daily.     . nitroGLYCERIN (NITROSTAT) 0.4 MG SL tablet Place 1 tablet (0.4 mg total) under the tongue every 5 (five) minutes as needed for chest pain. 25 tablet 5  . Omega-3 Fatty Acids (FISH OIL PO) Take 1,200 mg by mouth every morning.     Marland Kitchen oxybutynin (DITROPAN-XL) 10 MG 24 hr tablet Take 10 mg by mouth every morning.     . pantoprazole (PROTONIX) 40 MG tablet TAKE ONE (1) TABLET BY MOUTH TWO (2) TIMES DAILY 60 tablet  5  . pregabalin (LYRICA) 200 MG capsule TAKE ONE (1) CAPSULE BY MOUTH EVERY MORNING 30 capsule 5  . ranitidine (ZANTAC) 300 MG tablet TAKE ONE TABLET BY MOUTH EVERY EVENING 30 tablet 0  . simvastatin (ZOCOR) 20 MG tablet TAKE ONE (1) TABLET BY MOUTH EVERY DAY 30 tablet 2  . spironolactone (ALDACTONE) 25 MG tablet TAKE ONE (1) TABLET BY MOUTH EVERY DAY 30 tablet 9  . hyoscyamine (LEVSIN SL) 0.125 MG SL tablet Place 1 tablet (0.125 mg total) under the tongue every 4 (four) hours as needed. 30 tablet 1   No current facility-administered medications for this visit.     Allergies as of 12/30/2015 - Review Complete 12/30/2015  Allergen Reaction Noted  . Topamax [topiramate] Other (See Comments) 01/29/2011  . Aleve [naproxen sodium] Hives 01/29/2011  . Bee venom Swelling 08/26/2012  . Echinacea Hives 01/29/2011  . Other Other (See Comments) 08/26/2012  . Sulfa antibiotics Hives 01/29/2011  . Advil [ibuprofen] Hives 01/29/2011    Family History  Problem Relation Age of Onset  . Hypertension Mother   . Stroke Mother   . Liver disease Mother     Abcess  . Hypertension Father   . Coronary artery disease Father   . Migraines Father   . Clotting disorder Father   . Kidney failure Brother   . Hypertension Brother   . Migraines Brother   . Migraines Daughter   . Breast  cancer Other     Niece with breast cancer  . Colon cancer Paternal Grandmother   . Pancreatic cancer Paternal Grandmother   . Stomach cancer Paternal Grandmother   . Breast cancer Cousin   . Colon polyps Neg Hx   . Esophageal cancer Neg Hx     Social History   Social History  . Marital status: Married    Spouse name: Remo Lipps  . Number of children: 2  . Years of education: 16   Occupational History  . [place] History Main Topics  . Smoking status: Never Smoker  . Smokeless tobacco: Never Used     Comment: Secondhand - from family growing up, workplace intermittently  . Alcohol use 0.0 oz/week     Comment: occasionally - intermittent, no more than twice a week  . Drug use: No  . Sexual activity: Not on file   Other Topics Concern  . Not on file   Social History Narrative   Patient is married Remo Lipps) and lives at home with her husband and child.   Patient has two children.   Patient has a college education.   Patient is right-handed.   Patient drinks 1-2 cup of coffee/tea daily.    Review of Systems:     Constitutional: No weight loss, fever, chills, weakness or fatigue HEENT: Eyes: No change in vision               Ears, Nose, Throat:  No change in hearing or congestion Skin: No rash or itching Cardiovascular: Positive for chest pain and pressure No palpitations  Respiratory: No SOB or cough Gastrointestinal: See HPI and otherwise negative Genitourinary: No dysuria or change in urinary frequency Neurological: No headache, dizziness or syncope Musculoskeletal: No new muscle or joint pain Hematologic: No bleeding or bruising Psychiatric: No history of depression or anxiety    Physical Exam:  Vital signs: BP 104/70   Pulse 78   Ht 5\' 5"  (1.651 m)   Wt 299 lb (135.6 kg)   BMI  49.76 kg/m   General:   Pleasant Obese Caucasian female appears to be in NAD, Well developed, Well nourished, alert and cooperative Head:  Normocephalic  and atraumatic. Eyes:   PEERL, EOMI. No icterus. Conjunctiva pink. Ears:  Normal auditory acuity. Neck:  Supple Throat: Oral cavity and pharynx without inflammation, swelling or lesion.  Lungs: Respirations even and unlabored. Lungs clear to auscultation bilaterally.   No wheezes, crackles, or rhonchi.  Heart: Normal S1, S2. No MRG. Regular rate and rhythm. No peripheral edema, cyanosis or pallor.  Abdomen:  Soft, nondistended, nontender. No rebound or guarding. Normal bowel sounds. No appreciable masses or hepatomegaly. Rectal:  Not performed.  Msk:  Symmetrical without gross deformities.  Extremities:  Without edema, no deformity or joint abnormality.  Neurologic:  Alert and  oriented x4;  grossly normal neurologically. Skin:   Dry and intact without significant lesions or rashes. Psychiatric: Oriented to person, place and time. Demonstrates good judgement and reason without abnormal affect or behaviors.  MOST RECENT LABS AND IMAGING: CBC    Component Value Date/Time   WBC 5.9 07/28/2015 0859   RBC 4.67 07/28/2015 0859   HGB 14.1 07/28/2015 0859   HCT 41.6 07/28/2015 0859   PLT 173.0 07/28/2015 0859   MCV 89.1 07/28/2015 0859   MCH 30.5 09/30/2014 2104   MCHC 33.9 07/28/2015 0859   RDW 13.5 07/28/2015 0859   LYMPHSABS 1.9 10/07/2014 1606   MONOABS 0.8 10/07/2014 1606   EOSABS 0.1 10/07/2014 1606   BASOSABS 0.0 10/07/2014 1606    CMP     Component Value Date/Time   NA 142 07/28/2015 0859   K 4.3 07/28/2015 0859   CL 105 07/28/2015 0859   CO2 30 07/28/2015 0859   GLUCOSE 107 (H) 07/28/2015 0859   BUN 14 07/28/2015 0859   CREATININE 0.98 07/28/2015 0859   CALCIUM 9.7 07/28/2015 0859   PROT 6.7 07/28/2015 0859   ALBUMIN 4.0 07/28/2015 0859   AST 19 07/28/2015 0859   ALT 29 07/28/2015 0859   ALKPHOS 57 07/28/2015 0859   BILITOT 0.4 07/28/2015 0859   GFRNONAA >60 09/30/2014 2104   GFRAA >60 09/30/2014 2104    Assessment: 1. Atypical chest pain: Patient describes  intermittent chest pain/pressure, the last time this occurred she proceeded to her cardiologist with a negative workup including troponins and EKG; question relation to possible bile reflux versus gastritis versus GERD versus esophagitis versus other 2. Bilateral lower abdominal pain: Occurring over the past year every 3 days, results in diarrhea and then resolves; consider relation to cholecystectomy/bile salt diarrhea versus IBS 3. GERD: Currently no typical reflux or heartburn symptoms, controlled on patient's Pantoprazole 40 mg twice a day and Zantac every morning 4. Change in bowel habits: See bilateral lower abdominal pain above, "urgent diarrhea every 3 days"  Plan: 1. At this time recommend repeat EGD for further evaluation of atypical chest pain. Discussed risks, benefits, limitations and alternatives to this procedure and the patient agrees to proceed. Discussed with Dr. Russella Dar at time of patient's office visit, she is okay for the [facility]. 2. Prescribed Hyoscyamine 0.125 mg sublingual tabs every 4-6 hours as needed for abdominal cramping and diarrhea 3. Ordered CMP and CBC 4. Recommend starting a daily probiotic such as Align for the next 2 months at least 5. Recommend a high-fiber diet, 25-35 g per day 6. Patient to follow in clinic per Dr. Anselm Jungling recommendations or sooner if necessary.  Hyacinth Meeker, PA-C [facility] 12/30/2015, 10:34 AM  Cc: Okey Dupre,  Real Cons, *

## 2015-12-30 NOTE — Patient Instructions (Signed)
You have been scheduled for an endoscopy. Please follow written instructions given to you at your visit today. If you use inhalers (even only as needed), please bring them with you on the day of your procedure. Your physician has requested that you go to www.startemmi.com and enter the access code given to you at your visit today. This web site gives a general overview about your procedure. However, you should still follow specific instructions given to you by our office regarding your preparation for the procedure.  Go to the basement for labs today  We will send Hyoscyamine to your pharmacy  High-Fiber Diet Fiber, also called dietary fiber, is a type of carbohydrate found in fruits, vegetables, whole grains, and beans. A high-fiber diet can have many health benefits. Your health care provider may recommend a high-fiber diet to help:  Prevent constipation. Fiber can make your bowel movements more regular.  Lower your cholesterol.  Relieve hemorrhoids, uncomplicated diverticulosis, or irritable bowel syndrome.  Prevent overeating as part of a weight-loss plan.  Prevent heart disease, type 2 diabetes, and certain cancers. WHAT IS MY PLAN? The recommended daily intake of fiber includes:  38 grams for men under age 11.  37 grams for men over age 98.  35 grams for women under age 10.  73 grams for women over age 42. You can get the recommended daily intake of dietary fiber by eating a variety of fruits, vegetables, grains, and beans. Your health care provider may also recommend a fiber supplement if it is not possible to get enough fiber through your diet. WHAT DO I NEED TO KNOW ABOUT A HIGH-FIBER DIET?  Fiber supplements have not been widely studied for their effectiveness, so it is better to get fiber through food sources.  Always check the fiber content on thenutrition facts label of any prepackaged food. Look for foods that contain at least 5 grams of fiber per serving.  Ask  your dietitian if you have questions about specific foods that are related to your condition, especially if those foods are not listed in the following section.  Increase your daily fiber consumption gradually. Increasing your intake of dietary fiber too quickly may cause bloating, cramping, or gas.  Drink plenty of water. Water helps you to digest fiber. WHAT FOODS CAN I EAT? Grains Whole-grain breads. Multigrain cereal. Oats and oatmeal. Brown rice. Barley. Bulgur wheat. [place]. Bran muffins. Popcorn. Rye wafer crackers. Vegetables Sweet potatoes. Spinach. Kale. Artichokes. Cabbage. Broccoli. Green peas. Carrots. Squash. Fruits Berries. Pears. Apples. Oranges. Avocados. Prunes and raisins. Dried figs. Meats and Other Protein Sources [place], kidney, pinto, and soy beans. Split peas. Lentils. Nuts and seeds. Dairy Fiber-fortified yogurt. Beverages Fiber-fortified soy milk. Fiber-fortified orange juice. Other Fiber bars. The items listed above may not be a complete list of recommended foods or beverages. Contact your dietitian for more options. WHAT FOODS ARE NOT RECOMMENDED? Grains White bread. Pasta made with refined flour. White rice. Vegetables Fried potatoes. Canned vegetables. Well-cooked vegetables.  Fruits Fruit juice. Cooked, strained fruit. Meats and Other Protein Sources Fatty cuts of meat. Fried Sales executive or fried fish. Dairy Milk. Yogurt. Cream cheese. Sour cream. Beverages Soft drinks. Other Cakes and pastries. Butter and oils. The items listed above may not be a complete list of foods and beverages to avoid. Contact your dietitian for more information. WHAT ARE SOME TIPS FOR INCLUDING HIGH-FIBER FOODS IN MY DIET?  Eat a wide variety of high-fiber foods.  Make sure that half of all grains consumed each  day are whole grains.  Replace breads and cereals made from refined flour or white flour with whole-grain breads and cereals.  Replace white rice with brown rice,  bulgur wheat, or millet.  Start the day with a breakfast that is high in fiber, such as a cereal that contains at least 5 grams of fiber per serving.  Use beans in place of meat in soups, salads, or pasta.  Eat high-fiber snacks, such as berries, raw vegetables, nuts, or popcorn.   This information is not intended to replace advice given to you by your health care provider. Make sure you discuss any questions you have with your health care provider.   Document Released: 03/13/2005 Document Revised: 04/03/2014 Document Reviewed: 08/26/2013 Elsevier Interactive Patient Education 2016 [place] over the counter  High Fiber Daily

## 2016-01-03 ENCOUNTER — Ambulatory Visit: Payer: 59 | Admitting: Physician Assistant

## 2016-01-04 ENCOUNTER — Ambulatory Visit (INDEPENDENT_AMBULATORY_CARE_PROVIDER_SITE_OTHER): Payer: 59

## 2016-01-04 DIAGNOSIS — Z23 Encounter for immunization: Secondary | ICD-10-CM

## 2016-01-05 ENCOUNTER — Encounter: Payer: Self-pay | Admitting: Gastroenterology

## 2016-01-06 ENCOUNTER — Telehealth: Payer: Self-pay | Admitting: Gastroenterology

## 2016-01-06 NOTE — Telephone Encounter (Signed)
Patient notified to try hyoscyamine for CP.  She will try this and call back if she doesn't get relief

## 2016-01-19 ENCOUNTER — Ambulatory Visit (AMBULATORY_SURGERY_CENTER): Payer: 59 | Admitting: Gastroenterology

## 2016-01-19 ENCOUNTER — Encounter: Payer: Self-pay | Admitting: Gastroenterology

## 2016-01-19 VITALS — BP 111/69 | HR 78 | Temp 97.7°F | Resp 12 | Ht 65.0 in | Wt 299.0 lb

## 2016-01-19 DIAGNOSIS — K219 Gastro-esophageal reflux disease without esophagitis: Secondary | ICD-10-CM

## 2016-01-19 DIAGNOSIS — K2950 Unspecified chronic gastritis without bleeding: Secondary | ICD-10-CM | POA: Diagnosis not present

## 2016-01-19 DIAGNOSIS — R0789 Other chest pain: Secondary | ICD-10-CM | POA: Diagnosis not present

## 2016-01-19 MED ORDER — SODIUM CHLORIDE 0.9 % IV SOLN: 500.0000 mL | INTRAVENOUS | Status: DC

## 2016-01-19 NOTE — Patient Instructions (Signed)
YOU HAD AN ENDOSCOPIC PROCEDURE TODAY AT THE [facility]:   Refer to the procedure report that was given to you for any specific questions about what was found during the examination.  If the procedure report does not answer your questions, please call your gastroenterologist to clarify.  If you requested that your care partner not be given the details of your procedure findings, then the procedure report has been included in a sealed envelope for you to review at your convenience later.  YOU SHOULD EXPECT: Some feelings of bloating in the abdomen. Passage of more gas than usual.  Walking can help get rid of the air that was put into your GI tract during the procedure and reduce the bloating. If you had a lower endoscopy (such as a colonoscopy or flexible sigmoidoscopy) you may notice spotting of blood in your stool or on the toilet paper. If you underwent a bowel prep for your procedure, you may not have a normal bowel movement for a few days.  Please Note:  You might notice some irritation and congestion in your nose or some drainage.  This is from the oxygen used during your procedure.  There is no need for concern and it should clear up in a day or so.  SYMPTOMS TO REPORT IMMEDIATELY:     Following upper endoscopy (EGD)  Vomiting of blood or coffee ground material  New chest pain or pain under the shoulder blades  Painful or persistently difficult swallowing  New shortness of breath  Fever of 100F or higher  Black, tarry-looking stools  For urgent or emergent issues, a gastroenterologist can be reached at any hour by calling [phone].   DIET:  We do recommend a small meal at first, but then you may proceed to your regular diet.  Drink plenty of fluids but you should avoid alcoholic beverages for 24 hours.  ACTIVITY:  You should plan to take it easy for the rest of today and you should NOT DRIVE or use heavy machinery until tomorrow (because of the sedation medicines  used during the test).    FOLLOW UP: Our staff will call the number listed on your records the next business day following your procedure to check on you and address any questions or concerns that you may have regarding the information given to you following your procedure. If we do not reach you, we will leave a message.  However, if you are feeling well and you are not experiencing any problems, there is no need to return our call.  We will assume that you have returned to your regular daily activities without incident.  If any biopsies were taken you will be contacted by phone or by letter within the next 1-3 weeks.  Please call us at [phone] if you have not heard about the biopsies in 3 weeks.    SIGNATURES/CONFIDENTIALITY: You and/or your care partner have signed paperwork which will be entered into your electronic medical record.  These signatures attest to the fact that that the information above on your After Visit Summary has been reviewed and is understood.  Full responsibility of the confidentiality of this discharge information lies with you and/or your care-partner.   Resume medications. Information given on Gastritis. 

## 2016-01-19 NOTE — Progress Notes (Signed)
To PACU Pt awake and alert. Report to RN 

## 2016-01-19 NOTE — Op Note (Signed)
[facility] Patient Name: Heather Frederick Procedure Date: 01/19/2016 10:42 AM MRN: [id number] Endoscopist: Ladene Artist , MD Age: 60 Referring MD:  Date of Birth: 06-10-1955 Gender: Female Account #: [id number] Procedure:                Upper GI endoscopy Indications:              Unexplained chest pain Medicines:                Monitored Anesthesia Care Procedure:                Pre-Anesthesia Assessment:                           - Prior to the procedure, a History and Physical                            was performed, and patient medications and                            allergies were reviewed. The patient's tolerance of                            previous anesthesia was also reviewed. The risks                            and benefits of the procedure and the sedation                            options and risks were discussed with the patient.                            All questions were answered, and informed consent                            was obtained. Prior Anticoagulants: The patient has                            taken no previous anticoagulant or antiplatelet                            agents. ASA Grade Assessment: III - A patient with                            severe systemic disease. After reviewing the risks                            and benefits, the patient was deemed in                            satisfactory condition to undergo the procedure.                           After obtaining informed consent, the endoscope was  passed under direct vision. Throughout the                            procedure, the patient's blood pressure, pulse, and                            oxygen saturations were monitored continuously. The                            Model GIF-HQ190 [id number]) scope was introduced                            through the mouth, and advanced to the second part                            of duodenum. The upper GI  endoscopy was                            accomplished without difficulty. The patient                            tolerated the procedure well. Scope In: Scope Out: Findings:                 The examined esophagus was normal.                           Diffuse mildly erythematous mucosa without bleeding                            was found in the gastric fundus, in the gastric                            body and in the gastric antrum. Bile suctioned from                            stomach. Possible bile gastritis. Biopsies were                            taken with a cold forceps for histology.                           The exam of the stomach was otherwise normal.                           The duodenal bulb and second portion of the                            duodenum were normal. Complications:            No immediate complications. Estimated Blood Loss:     Estimated blood loss was minimal. Impression:               - Normal esophagus.                           -  Erythematous mucosa in the gastric fundus,                            gastric body and antrum. Biopsied.                           - Normal duodenal bulb and second portion of the                            duodenum. Recommendation:           - Patient has a contact number available for                            emergencies. The signs and symptoms of potential                            delayed complications were discussed with the                            patient. Return to normal activities tomorrow.                            Written discharge instructions were provided to the                            patient.                           - Resume previous diet.                           - Continue present medications.                           - Await pathology results.                           - No GI cause for chest pain found. Return to PCP                            for further evaluation. Ladene Artist,  MD 01/19/2016 11:01:46 AM This report has been signed electronically.

## 2016-01-19 NOTE — Progress Notes (Signed)
Called to room to assist during endoscopic procedure.  Patient ID and intended procedure confirmed with present staff. Received instructions for my participation in the procedure from the performing physician.  

## 2016-01-20 ENCOUNTER — Other Ambulatory Visit: Payer: Self-pay | Admitting: Gastroenterology

## 2016-01-20 ENCOUNTER — Other Ambulatory Visit: Payer: Self-pay | Admitting: Internal Medicine

## 2016-01-20 ENCOUNTER — Telehealth: Payer: Self-pay | Admitting: *Deleted

## 2016-01-20 NOTE — Telephone Encounter (Signed)
  Follow up Call-  Call back number 01/19/2016  Post procedure Call Back phone  # [phone] hm  Permission to leave phone message Yes  Some recent data might be hidden     Patient questions:  Do you have a fever, pain , or abdominal swelling? No. Pain Score  0 *  Have you tolerated food without any problems? Yes.    Have you been able to return to your normal activities? Yes.    Do you have any questions about your discharge instructions: Diet   No. Medications  No. Follow up visit  No.  Do you have questions or concerns about your Care? No.  Actions: * If pain score is 4 or above: No action needed, pain <4.

## 2016-01-27 ENCOUNTER — Ambulatory Visit (INDEPENDENT_AMBULATORY_CARE_PROVIDER_SITE_OTHER): Payer: 59 | Admitting: Internal Medicine

## 2016-01-27 ENCOUNTER — Encounter: Payer: Self-pay | Admitting: Internal Medicine

## 2016-01-27 ENCOUNTER — Other Ambulatory Visit: Payer: Self-pay | Admitting: Internal Medicine

## 2016-01-27 VITALS — BP 110/70 | HR 70 | Temp 98.4°F | Resp 18 | Ht 65.0 in | Wt 299.0 lb

## 2016-01-27 DIAGNOSIS — Z23 Encounter for immunization: Secondary | ICD-10-CM | POA: Diagnosis not present

## 2016-01-27 DIAGNOSIS — R1010 Upper abdominal pain, unspecified: Secondary | ICD-10-CM | POA: Insufficient documentation

## 2016-01-27 DIAGNOSIS — R1011 Right upper quadrant pain: Secondary | ICD-10-CM

## 2016-01-27 DIAGNOSIS — Z1231 Encounter for screening mammogram for malignant neoplasm of breast: Secondary | ICD-10-CM

## 2016-01-27 MED ORDER — CHOLESTYRAMINE LIGHT 4 G PO PACK: 4.0000 g | PACK | Freq: Two times a day (BID) | ORAL | 6 refills | Status: DC

## 2016-01-27 NOTE — Patient Instructions (Signed)
We have sent in cholestyramine to try for the stomach. Try taking it daily to see if it helps with the symptoms. Take before breakfast.   If you are still having symptoms you can do it twice daily.   If it helps you can try it as needed to keep the symptoms away.   We are checking the ultrasound as well.

## 2016-01-27 NOTE — Assessment & Plan Note (Signed)
Checking US abdomen since last was not able to visualize well. GI note states that bile reflux could be causing her symptoms and then bile was suctioned from the stomach with erythema on the EGD so it makes me suspicious that this is the cause. Rx for cholestyramine to try for her symptoms.

## 2016-01-27 NOTE — Progress Notes (Signed)
   Subjective:    Patient ID: Heather Frederick, female    DOB: 1956-01-10, 60 y.o.   MRN: [id number]  HPI The patient is a 60 YO female coming in for atypical chest pain. She has seen cardiology and they have decided it is not cardiac. She recently went to GI and underwent endoscopy. They told her there was no cause for it on the endoscopy however their notes contradict that. She was not given any treatment from them except hyoscyamine which she has not tried. She does have RUQ pain as well as chest discomfort. She is not able to pin it down to certain foods or around eating.   Review of Systems  Constitutional: Negative.   Respiratory: Negative for cough, chest tightness, shortness of breath and wheezing.   Cardiovascular: Positive for chest pain. Negative for palpitations and leg swelling.  Gastrointestinal: Positive for abdominal distention, abdominal pain and diarrhea. Negative for blood in stool, constipation, nausea and vomiting.  Skin: Negative.   Neurological: Negative.       Objective:   Physical Exam  Constitutional: She is oriented to person, place, and time. She appears well-developed and well-nourished.  Obese  HENT:  Head: Normocephalic and atraumatic.  Cardiovascular: Normal rate and regular rhythm.   Pulmonary/Chest: Effort normal and breath sounds normal. No respiratory distress. She has no wheezes. She has no rales. She exhibits no tenderness.  Abdominal: Soft. Bowel sounds are normal. She exhibits no distension and no mass. There is tenderness. There is no rebound and no guarding.  Obesity versus mild distention. RUQ tenderness extending to midline.   Musculoskeletal: She exhibits no edema.  Neurological: She is alert and oriented to person, place, and time.   Vitals:   01/27/16 0939  BP: 110/70  Pulse: 70  Resp: 18  Temp: 98.4 F (36.9 C)  TempSrc: Oral  SpO2: 98%  Weight: 299 lb (135.6 kg)  Height: 5\' 5"  (1.651 m)      Assessment & Plan:  Pneumonia 23  given at visit.

## 2016-01-27 NOTE — Progress Notes (Signed)
Pre visit review using our clinic review tool, if applicable. No additional management support is needed unless otherwise documented below in the visit note. 

## 2016-01-28 ENCOUNTER — Ambulatory Visit
Admission: RE | Admit: 2016-01-28 | Discharge: 2016-01-28 | Disposition: A | Discharge status: Home / Self Care | Payer: 59 | Source: Ambulatory Visit | Attending: Internal Medicine | Admitting: Internal Medicine

## 2016-01-28 DIAGNOSIS — R1011 Right upper quadrant pain: Secondary | ICD-10-CM

## 2016-02-02 ENCOUNTER — Encounter: Payer: Self-pay | Admitting: Gastroenterology

## 2016-02-24 ENCOUNTER — Other Ambulatory Visit: Payer: Self-pay | Admitting: Gastroenterology

## 2016-02-29 ENCOUNTER — Ambulatory Visit (INDEPENDENT_AMBULATORY_CARE_PROVIDER_SITE_OTHER): Payer: 59 | Admitting: Interventional Cardiology

## 2016-02-29 ENCOUNTER — Encounter: Payer: Self-pay | Admitting: Interventional Cardiology

## 2016-02-29 ENCOUNTER — Ambulatory Visit
Admission: RE | Admit: 2016-02-29 | Discharge: 2016-02-29 | Disposition: A | Discharge status: Home / Self Care | Payer: 59 | Source: Ambulatory Visit | Attending: Internal Medicine | Admitting: Internal Medicine

## 2016-02-29 VITALS — BP 98/60 | HR 56 | Ht 65.0 in | Wt 304.4 lb

## 2016-02-29 DIAGNOSIS — I5030 Unspecified diastolic (congestive) heart failure: Secondary | ICD-10-CM

## 2016-02-29 DIAGNOSIS — Z1231 Encounter for screening mammogram for malignant neoplasm of breast: Secondary | ICD-10-CM

## 2016-02-29 DIAGNOSIS — E785 Hyperlipidemia, unspecified: Secondary | ICD-10-CM

## 2016-02-29 DIAGNOSIS — G4733 Obstructive sleep apnea (adult) (pediatric): Secondary | ICD-10-CM

## 2016-02-29 DIAGNOSIS — I10 Essential (primary) hypertension: Secondary | ICD-10-CM | POA: Diagnosis not present

## 2016-02-29 DIAGNOSIS — I519 Heart disease, unspecified: Secondary | ICD-10-CM

## 2016-02-29 MED ORDER — SIMVASTATIN 20 MG PO TABS: ORAL_TABLET | ORAL | 3 refills | Status: DC

## 2016-02-29 MED ORDER — SPIRONOLACTONE 25 MG PO TABS: ORAL_TABLET | ORAL | 3 refills | Status: DC

## 2016-02-29 NOTE — Patient Instructions (Signed)
Medication Instructions:  None  Labwork: None  Testing/Procedures: None  Follow-Up: Your physician recommends that you schedule a follow-up appointment soon with Dr. Radford Pax for reconsideration of sleep apnea.  Your physician wants you to follow-up in: 9-12 months with Dr. Tamala Julian.  You will receive a reminder letter in the mail two months in advance. If you don't receive a letter, please call our office to schedule the follow-up appointment.    Any Other Special Instructions Will Be Listed Below (If Applicable).     If you need a refill on your cardiac medications before your next appointment, please call your pharmacy.

## 2016-02-29 NOTE — Progress Notes (Signed)
Cardiology Office Note    Date:  02/29/2016   ID:  Heather Frederick [birth year], MRN [id number]  PCP:  Heather Koch, MD  Cardiologist: Heather Grooms, MD   Chief Complaint  Patient presents with  . Follow-up    Htn  . Chest Pain    History of Present Illness:  Heather Frederick is a 60 y.o. female Follow-up of obesity, diastolic heart failure, obstructive sleep apnea, and noncardiac chest pain.   [name] continues to have multiple complaints. Excessive daytime sleepiness is occurring. She has difficulty with her C Pap equipment but is compliant.  She has been having episodes of chest pain that are atypical. She has clean coronaries by cath. She has had a GI workup including endoscopy which according to her was done revealing.    Past Medical History:  Diagnosis Date  . Allergy   . Anxiety   . Arthritis   . Benign essential HTN 09/23/2014  . Chronic kidney disease    uti  . Depression   . Dyslipidemia   . Elevated liver enzymes   . Family history of anesthesia complication    father has a severe hard time waking up  . Gallstones    a. Seen on CT 01/2014.  Marland Kitchen GERD (gastroesophageal reflux disease)   . Hard of hearing   . Hepatic steatosis   . History of frequent urinary tract infections   . Hyperlipidemia   . Hypertension   . Lateral epicondylitis of right elbow   . Mental disorder   . Meralgia paresthetica of right side 10/02/2012   slight at 05/2014  . Migraine headache   . Obesity   . OSA (obstructive sleep apnea)    severe with AHI 37/hr now on CPAP at 18cm H2O  . Osteoarthritis   . Raynaud disease    in feet per patient   . Sleep apnea    wears c-pap  . Urinary tract infection     Past Surgical History:  Procedure Laterality Date  . ABDOMINAL HYSTERECTOMY  1/04   partial  . BLADDER SUSPENSION  6/10  . CARDIAC CATHETERIZATION    . carpel tunnel left/right  7/08, 8/08 Bilateral 8/08 and 7/08  . carpel tunnel rel Right 4/12  .  CHOLECYSTECTOMY N/A 06/11/2014   Procedure: LAPAROSCOPIC CHOLECYSTECTOMY WITH ATTEMPTED INTRAOPERATIVE CHOLANGIOGRAM;  Surgeon: Jackolyn Confer, MD;  Location: [facility];  Service: General;  Laterality: N/A;  . COLONOSCOPY    . ERCP N/A 10/19/2014   Procedure: ENDOSCOPIC RETROGRADE CHOLANGIOPANCREATOGRAPHY (ERCP);  Surgeon: Ladene Artist, MD;  Location: [facility];  Service: Endoscopy;  Laterality: N/A;  . JOINT REPLACEMENT    . KNEE ARTHROSCOPY Left 12/12  . KNEE ARTHROSCOPY Right 12/06  . LEFT HEART CATHETERIZATION WITH CORONARY ANGIOGRAM N/A 03/10/2014   Procedure: LEFT HEART CATHETERIZATION WITH CORONARY ANGIOGRAM;  Surgeon: Heather Grooms, MD;  Location[facility];  Service: Cardiovascular;  Laterality: N/A;  . PLANTAR FASCIA RELEASE Right 12/10  . radial tunnel release     right arm   . ROTATOR CUFF REPAIR Left 6/11  . tennis elbow release Right 7/04  . TOTAL KNEE ARTHROPLASTY Left 09/10/2012   Procedure: TOTAL KNEE ARTHROPLASTY- left;  Surgeon: Garald Balding, MD;  Location: [facility];  Service: Orthopedics;  Laterality: Left;  Left total knee arthroplasty    Current Medications: Outpatient Medications Prior to Visit  Medication Sig Dispense Refill  . acetaminophen (TYLENOL) 500 MG tablet Take 800 mg  by mouth every 8 (eight) hours as needed for mild pain or headache.     Marland Kitchen amLODipine (NORVASC) 2.5 MG tablet Take 1 tablet (2.5 mg total) by mouth daily. 90 tablet 3  . aspirin 81 MG tablet Take 81 mg by mouth daily.    . Cholecalciferol (VITAMIN D-3) 1000 UNITS CAPS Take 2,000 Units by mouth every morning.     . cholestyramine light (PREVALITE) 4 g packet Take 1 packet (4 g total) by mouth 2 (two) times daily. 60 packet 6  . citalopram (CELEXA) 20 MG tablet TAKE 1 TABLET BY MOUTH EVERY DAY 30 tablet 3  . CRANBERRY-VITAMIN C PO Take 4,200 mg by mouth every morning.     . cyclobenzaprine (FLEXERIL) 5 MG tablet     . eletriptan (RELPAX) 40 MG tablet Take 1 tablet (40 mg total) by  mouth every 2 (two) hours as needed for migraine. 8 tablet 8  . hyoscyamine (LEVSIN SL) 0.125 MG SL tablet Place 1 tablet (0.125 mg total) under the tongue every 4 (four) hours as needed. 30 tablet 1  . loratadine (CLARITIN) 10 MG tablet Take 10 mg by mouth 2 (two) times daily as needed for allergies.     . Lutein-Zeaxanthin 25-5 MG CAPS Take 1 capsule by mouth every morning.     . Methylcobalamin (METHYL B-12 PO) Take 2,500 mcg by mouth every morning.     . metoprolol succinate (TOPROL-XL) 25 MG 24 hr tablet     . Multiple Vitamin (MULTIVITAMIN WITH MINERALS) TABS Take 1 tablet by mouth daily.     . nitrofurantoin (MACRODANTIN) 100 MG capsule Take 100 mg by mouth daily.     . nitroGLYCERIN (NITROSTAT) 0.4 MG SL tablet Place 1 tablet (0.4 mg total) under the tongue every 5 (five) minutes as needed for chest pain. 25 tablet 5  . Omega-3 Fatty Acids (FISH OIL PO) Take 1,200 mg by mouth every morning.     Marland Kitchen oxybutynin (DITROPAN-XL) 10 MG 24 hr tablet Take 10 mg by mouth every morning.     . pantoprazole (PROTONIX) 40 MG tablet TAKE ONE (1) TABLET BY MOUTH TWO (2) TIMES DAILY 60 tablet 5  . pregabalin (LYRICA) 200 MG capsule TAKE ONE (1) CAPSULE BY MOUTH EVERY MORNING 30 capsule 5  . ranitidine (ZANTAC) 300 MG tablet TAKE ONE TABLET BY MOUTH EVERY EVENING 30 tablet 11  . simvastatin (ZOCOR) 20 MG tablet TAKE ONE (1) TABLET BY MOUTH EVERY DAY 30 tablet 3  . spironolactone (ALDACTONE) 25 MG tablet TAKE ONE (1) TABLET BY MOUTH EVERY DAY 30 tablet 9   No facility-administered medications prior to visit.      Allergies:   Topamax [topiramate]; Aleve [naproxen sodium]; Bee venom; Echinacea; Other; Sulfa antibiotics; and Advil [ibuprofen]   Social History   Social History  . Marital status: Married    Spouse name: Remo Lipps  . Number of children: 2  . Years of education: 16   Occupational History  . [place] History Main Topics  . Smoking status: Never Smoker  .  Smokeless tobacco: Never Used     Comment: Secondhand - from family growing up, workplace intermittently  . Alcohol use 0.0 oz/week     Comment: occasionally - intermittent, no more than twice a week  . Drug use: No  . Sexual activity: Not Asked   Other Topics Concern  . None   Social History Narrative   Patient is married Remo Lipps) and lives at  home with her husband and child.   Patient has two children.   Patient has a college education.   Patient is right-handed.   Patient drinks 1-2 cup of coffee/tea daily.     Family History:  The patient's family history includes Breast cancer in her cousin and other; Clotting disorder in her father; Colon cancer in her paternal grandmother; Coronary artery disease in her father; Hypertension in her brother, father, and mother; Kidney failure in her brother; Liver disease in her mother; Migraines in her brother, daughter, and father; Pancreatic cancer in her paternal grandmother; Stomach cancer in her paternal grandmother; Stroke in her mother.   ROS:   Please see the history of present illness.    Depression, anxiety, insomnia, abdominal pain, back discomfort, unexplained weight gain.  All other systems reviewed and are negative.   PHYSICAL EXAM:   VS:  BP 98/60   Pulse (!) 56   Ht 5\' 5"  (1.651 m)   Wt (!) 304 lb 6.4 oz (138.1 kg)   SpO2 98%   BMI 50.65 kg/m   blood pressure sitting 115/80 mmHg. Standing 110/90 mmHg GEN: Well nourished, well developed, in no acute distress . Morbid obesity. HEENT: normal  Neck: no JVD, carotid bruits, or masses Cardiac: RRR; no murmurs, rubs, or gallops,no edema  Respiratory:  clear to auscultation bilaterally, normal work of breathing GI: soft, nontender, nondistended, + BS MS: no deformity or atrophy  Skin: warm and dry, no rash Neuro:  Alert and Oriented x 3, Strength and sensation are intact Psych: euthymic mood, full affect  Wt Readings from Last 3 Encounters:  02/29/16 (!) 304 lb 6.4 oz  (138.1 kg)  01/27/16 299 lb (135.6 kg)  01/19/16 299 lb (135.6 kg)      Studies/Labs Reviewed:   EKG:  EKG  No new study  Recent Labs: 12/30/2015: ALT 30; BUN 15; Creatinine, Ser 0.91; Hemoglobin 15.0; Platelets 186.0; Potassium 4.0; Sodium 141   Lipid Panel    Component Value Date/Time   CHOL 146 07/28/2015 0859   TRIG 133.0 07/28/2015 0859   HDL 31.30 (L) 07/28/2015 0859   CHOLHDL 5 07/28/2015 0859   VLDL 26.6 07/28/2015 0859   LDLCALC 88 07/28/2015 0859    Additional studies/ records that were reviewed today include:  No new data    ASSESSMENT:    1. Diastolic heart failure, stage B   2. Benign essential HTN   3. OSA (obstructive sleep apnea)   4. Severe obesity (BMI >= 40) (HCC)   5. Dyslipidemia      PLAN:  In order of problems listed above:  1. No evidence of volume overload. Doing okay after the switch from beta blocker to amlodipine. No change indicated. 2. Excellent control. 2 g sodium diet and weight loss applicator. 3. Reevaluation by her sleep physician, Dr. Radford Pax. 4. Encouraged aerobic activity and weight loss. 5. No changes    Medication Adjustments/Labs and Tests Ordered: Current medicines are reviewed at length with the patient today.  Concerns regarding medicines are outlined above.  Medication changes, Labs and Tests ordered today are listed in the Patient Instructions below. There are no Patient Instructions on file for this visit.   Signed, Heather Grooms, MD  02/29/2016 9:47 AM    [facility] [place], [place], [place]  [address] Phone: [phone]; Fax: [phone]

## 2016-03-05 ENCOUNTER — Encounter: Payer: Self-pay | Admitting: Cardiology

## 2016-03-07 ENCOUNTER — Ambulatory Visit (INDEPENDENT_AMBULATORY_CARE_PROVIDER_SITE_OTHER): Payer: 59 | Admitting: Cardiology

## 2016-03-07 ENCOUNTER — Encounter: Payer: Self-pay | Admitting: Cardiology

## 2016-03-07 VITALS — BP 118/70 | HR 104 | Ht 65.0 in | Wt 304.0 lb

## 2016-03-07 DIAGNOSIS — G4733 Obstructive sleep apnea (adult) (pediatric): Secondary | ICD-10-CM

## 2016-03-07 DIAGNOSIS — R600 Localized edema: Secondary | ICD-10-CM

## 2016-03-07 DIAGNOSIS — I10 Essential (primary) hypertension: Secondary | ICD-10-CM

## 2016-03-07 NOTE — Progress Notes (Signed)
Cardiology Office Note    Date:  03/07/2016   ID:  [name], [name] 03/22/1956, MRN [id number]  PCP:  Hoyt Koch, MD  Cardiologist:  Fransico Him, MD   Chief Complaint  Patient presents with  . Sleep Apnea  . Hypertension    History of Present Illness:  Heather Frederick is a 60 y.o. female who presents for followup of sleep apnea.  She has severe OSA with an AHI of 37.6/hr occurring in REM sleep in the non supine position.  She had oxygen desaturations as low as 83%.  She is on 18cm H2O and now presents for followup.  She is doing well with her CPAP therapy. She tolerates the nasal  mask and feels the pressure is adequate.  She denies any mouth dryness or head congestion.  Since starting the CPAP she has noticed some improvement in her daytime sleepiness but still falls asleep easily when sitting down in front of the TV.   She feels rested when she gets up in the am.  She no longer snores.  She goes to bed at 11pm and falls asleep quickly.  She sleeps through the night and gets up between 6-7am.  She walks randomly but not enough aerobic exercise due to back issues. She has no problems with drop attacks but has problems with sleepiness when she is at home sitting down especially after a meal.  She also is complaining that she is now having LE edema which is new for her.     Past Medical History:  Diagnosis Date  . Allergy   . Anxiety   . Arthritis   . Benign essential HTN 09/23/2014  . Chronic kidney disease    uti  . Depression   . Dyslipidemia   . Elevated liver enzymes   . Family history of anesthesia complication    father has a severe hard time waking up  . Gallstones    a. Seen on CT 01/2014.  Marland Kitchen GERD (gastroesophageal reflux disease)   . Hard of hearing   . Hepatic steatosis   . History of frequent urinary tract infections   . Hyperlipidemia   . Hypertension   . Lateral epicondylitis of right elbow   . Mental disorder   . Meralgia paresthetica of right  side 10/02/2012   slight at 05/2014  . Migraine headache   . Obesity   . OSA (obstructive sleep apnea)    severe with AHI 37/hr now on CPAP at 18cm H2O  . Osteoarthritis   . Raynaud disease    in feet per patient   . Sleep apnea    wears c-pap  . Urinary tract infection     Past Surgical History:  Procedure Laterality Date  . ABDOMINAL HYSTERECTOMY  1/04   partial  . BLADDER SUSPENSION  6/10  . CARDIAC CATHETERIZATION    . carpel tunnel left/right  7/08, 8/08 Bilateral 8/08 and 7/08  . carpel tunnel rel Right 4/12  . CHOLECYSTECTOMY N/A 06/11/2014   Procedure: LAPAROSCOPIC CHOLECYSTECTOMY WITH ATTEMPTED INTRAOPERATIVE CHOLANGIOGRAM;  Surgeon: Jackolyn Confer, MD;  Location: [facility];  Service: General;  Laterality: N/A;  . COLONOSCOPY    . ERCP N/A 10/19/2014   Procedure: ENDOSCOPIC RETROGRADE CHOLANGIOPANCREATOGRAPHY (ERCP);  Surgeon: Ladene Artist, MD;  Location: [facility];  Service: Endoscopy;  Laterality: N/A;  . JOINT REPLACEMENT    . KNEE ARTHROSCOPY Left 12/12  . KNEE ARTHROSCOPY Right 12/06  . LEFT HEART CATHETERIZATION WITH CORONARY ANGIOGRAM N/A  03/10/2014   Procedure: LEFT HEART CATHETERIZATION WITH CORONARY ANGIOGRAM;  Surgeon: Sinclair Grooms, MD;  Location: [facility] CATH LAB;  Service: Cardiovascular;  Laterality: N/A;  . PLANTAR FASCIA RELEASE Right 12/10  . radial tunnel release     right arm   . ROTATOR CUFF REPAIR Left 6/11  . tennis elbow release Right 7/04  . TOTAL KNEE ARTHROPLASTY Left 09/10/2012   Procedure: TOTAL KNEE ARTHROPLASTY- left;  Surgeon: Garald Balding, MD;  Location: [facility];  Service: Orthopedics;  Laterality: Left;  Left total knee arthroplasty    Current Medications: Outpatient Medications Prior to Visit  Medication Sig Dispense Refill  . acetaminophen (TYLENOL) 500 MG tablet Take 800 mg by mouth every 8 (eight) hours as needed for mild pain or headache.     Marland Kitchen amLODipine (NORVASC) 2.5 MG tablet Take 1 tablet (2.5 mg total) by mouth daily.  90 tablet 3  . aspirin 81 MG tablet Take 81 mg by mouth daily.    . Cholecalciferol (VITAMIN D-3) 1000 UNITS CAPS Take 2,000 Units by mouth every morning.     . citalopram (CELEXA) 20 MG tablet TAKE 1 TABLET BY MOUTH EVERY DAY 30 tablet 3  . CRANBERRY-VITAMIN C PO Take 4,200 mg by mouth every morning.     . cyclobenzaprine (FLEXERIL) 5 MG tablet     . eletriptan (RELPAX) 40 MG tablet Take 1 tablet (40 mg total) by mouth every 2 (two) hours as needed for migraine. 8 tablet 8  . hyoscyamine (LEVSIN SL) 0.125 MG SL tablet Place 1 tablet (0.125 mg total) under the tongue every 4 (four) hours as needed. 30 tablet 1  . loratadine (CLARITIN) 10 MG tablet Take 10 mg by mouth 2 (two) times daily as needed for allergies.     . Lutein-Zeaxanthin 25-5 MG CAPS Take 1 capsule by mouth every morning.     . Methylcobalamin (METHYL B-12 PO) Take 2,500 mcg by mouth every morning.     . metoprolol succinate (TOPROL-XL) 25 MG 24 hr tablet     . Multiple Vitamin (MULTIVITAMIN WITH MINERALS) TABS Take 1 tablet by mouth daily.     . nitrofurantoin (MACRODANTIN) 100 MG capsule Take 100 mg by mouth daily.     . nitroGLYCERIN (NITROSTAT) 0.4 MG SL tablet Place 1 tablet (0.4 mg total) under the tongue every 5 (five) minutes as needed for chest pain. 25 tablet 5  . Omega-3 Fatty Acids (FISH OIL PO) Take 1,200 mg by mouth every morning.     Marland Kitchen oxybutynin (DITROPAN-XL) 10 MG 24 hr tablet Take 10 mg by mouth every morning.     . pantoprazole (PROTONIX) 40 MG tablet TAKE ONE (1) TABLET BY MOUTH TWO (2) TIMES DAILY 60 tablet 5  . pregabalin (LYRICA) 200 MG capsule TAKE ONE (1) CAPSULE BY MOUTH EVERY MORNING 30 capsule 5  . ranitidine (ZANTAC) 300 MG tablet TAKE ONE TABLET BY MOUTH EVERY EVENING 30 tablet 11  . simvastatin (ZOCOR) 20 MG tablet TAKE ONE (1) TABLET BY MOUTH EVERY DAY 90 tablet 3  . spironolactone (ALDACTONE) 25 MG tablet TAKE ONE (1) TABLET BY MOUTH EVERY DAY 90 tablet 3  . cholestyramine light (PREVALITE) 4 g  packet Take 1 packet (4 g total) by mouth 2 (two) times daily. (Patient not taking: Reported on 03/07/2016) 60 packet 6   No facility-administered medications prior to visit.      Allergies:   Topamax [topiramate]; Aleve [naproxen sodium]; Bee venom; Echinacea; Other; Sulfa antibiotics; and  Advil [ibuprofen]   Social History   Social History  . Marital status: Married    Spouse name: Remo Lipps  . Number of children: 2  . Years of education: 16   Occupational History  . [employer] History Main Topics  . Smoking status: Never Smoker  . Smokeless tobacco: Never Used     Comment: Secondhand - from family growing up, workplace intermittently  . Alcohol use 0.0 oz/week     Comment: occasionally - intermittent, no more than twice a week  . Drug use: No  . Sexual activity: Not Asked   Other Topics Concern  . None   Social History Narrative   Patient is married Remo Lipps) and lives at home with her husband and child.   Patient has two children.   Patient has a college education.   Patient is right-handed.   Patient drinks 1-2 cup of coffee/tea daily.     Family History:  The patient's family history includes Breast cancer in her cousin and other; Clotting disorder in her father; Colon cancer in her paternal grandmother; Coronary artery disease in her father; Hypertension in her brother, father, and mother; Kidney failure in her brother; Liver disease in her mother; Migraines in her brother, daughter, and father; Pancreatic cancer in her paternal grandmother; Stomach cancer in her paternal grandmother; Stroke in her mother.   ROS:   Please see the history of present illness.    ROS All other systems reviewed and are negative.  No flowsheet data found.     PHYSICAL EXAM:   VS:  BP 118/70 (BP Location: Right Arm)   Pulse (!) 104   Ht 5\' 5"  (1.651 m)   Wt (!) 304 lb (137.9 kg)   BMI 50.59 kg/m    GEN: Well nourished, well developed, in no acute  distress  HEENT: normal  Neck: no JVD, carotid bruits, or masses Cardiac: RRR; no murmurs, rubs, or gallops,no edema.  Intact distal pulses bilaterally.  Respiratory:  clear to auscultation bilaterally, normal work of breathing GI: soft, nontender, nondistended, + BS MS: no deformity or atrophy  Skin: warm and dry, no rash Neuro:  Alert and Oriented x 3, Strength and sensation are intact Psych: euthymic mood, full affect  Wt Readings from Last 3 Encounters:  03/07/16 (!) 304 lb (137.9 kg)  02/29/16 (!) 304 lb 6.4 oz (138.1 kg)  01/27/16 299 lb (135.6 kg)      Studies/Labs Reviewed:   EKG:  EKG is not ordered today.    Recent Labs: 12/30/2015: ALT 30; BUN 15; Creatinine, Ser 0.91; Hemoglobin 15.0; Platelets 186.0; Potassium 4.0; Sodium 141   Lipid Panel    Component Value Date/Time   CHOL 146 07/28/2015 0859   TRIG 133.0 07/28/2015 0859   HDL 31.30 (L) 07/28/2015 0859   CHOLHDL 5 07/28/2015 0859   VLDL 26.6 07/28/2015 0859   LDLCALC 88 07/28/2015 0859    Additional studies/ records that were reviewed today include:  CPAP download    ASSESSMENT:    1. OSA (obstructive sleep apnea)   2. Benign essential HTN   3. Severe obesity (BMI >= 40) (HCC)   4. Edema extremities      PLAN:  In order of problems listed above:  OSA - the patient is tolerating PAP therapy well without any problems. The PAP download was reviewed today and showed an AHI of 1.2/hr on 18 cm H2O with 83% compliance in using more than 4 hours nightly.  The patient has been using and benefiting from CPAP use and will continue to benefit from therapy. She still easily falls asleep if she is at home sitting down especially after meals.  She does not have this problem if she is out of her house and it does not sound like drop attacks.  She feels the pressure could be lower so I will get a 2 week autotitration from 4 to 18cm H2O.  HTN - BP controlled on current meds.  Continue amlodipine/BB and aldactone.  Recent BMET normal. 3.   Obesity - I have encouraged her to try to get into a routine exercise program and cut back on carbs and portions.  4.   LE edema - she is concerned that this is new.  I suspect that this is due to morbid obesity, dietary indiscretion with sodium intake and sedentary state.  She has no edema on exam today.  I will get an echo to assess LVF.      Medication Adjustments/Labs and Tests Ordered: Current medicines are reviewed at length with the patient today.  Concerns regarding medicines are outlined above.  Medication changes, Labs and Tests ordered today are listed in the Patient Instructions below.  There are no Patient Instructions on file for this visit.   Signed, Fransico Him, MD  03/07/2016 11:30 AM    [facility] [address], [place], [place]  [address] Phone: [phone]; Fax: [phone]

## 2016-03-07 NOTE — Patient Instructions (Signed)
Medication Instructions:  Your physician recommends that you continue on your current medications as directed. Please refer to the Current Medication list given to you today.   Labwork: None  Testing/Procedures: Your physician has requested that you have an echocardiogram. Echocardiography is a painless test that uses sound waves to create images of your heart. It provides your doctor with information about the size and shape of your heart and how well your heart's chambers and valves are working. This procedure takes approximately one hour. There are no restrictions for this procedure.  Follow-Up: Your physician wants you to follow-up in: 1 year with Dr. Radford Pax. You will receive a reminder letter in the mail two months in advance. If you don't receive a letter, please call our office to schedule the follow-up appointment.   Any Other Special Instructions Will Be Listed Below (If Applicable). Dr. Radford Pax has ordered for you a 2 week auto-titration.    If you need a refill on your cardiac medications before your next appointment, please call your pharmacy.

## 2016-03-09 ENCOUNTER — Ambulatory Visit (HOSPITAL_COMMUNITY): Payer: 59 | Attending: Cardiology

## 2016-03-09 ENCOUNTER — Other Ambulatory Visit: Payer: Self-pay

## 2016-03-09 DIAGNOSIS — G4733 Obstructive sleep apnea (adult) (pediatric): Secondary | ICD-10-CM | POA: Diagnosis not present

## 2016-03-09 DIAGNOSIS — R600 Localized edema: Secondary | ICD-10-CM | POA: Diagnosis not present

## 2016-03-14 ENCOUNTER — Ambulatory Visit (INDEPENDENT_AMBULATORY_CARE_PROVIDER_SITE_OTHER): Payer: 59 | Admitting: Orthopaedic Surgery

## 2016-03-14 ENCOUNTER — Encounter (INDEPENDENT_AMBULATORY_CARE_PROVIDER_SITE_OTHER): Payer: Self-pay | Admitting: Orthopaedic Surgery

## 2016-03-14 ENCOUNTER — Ambulatory Visit (INDEPENDENT_AMBULATORY_CARE_PROVIDER_SITE_OTHER): Payer: 59

## 2016-03-14 ENCOUNTER — Telehealth: Payer: Self-pay | Admitting: *Deleted

## 2016-03-14 VITALS — BP 111/60 | HR 81 | Resp 14 | Ht 65.0 in | Wt 300.0 lb

## 2016-03-14 DIAGNOSIS — M79674 Pain in right toe(s): Secondary | ICD-10-CM

## 2016-03-14 MED ORDER — LIDOCAINE HCL 1 % IJ SOLN
0.5000 mL | INTRAMUSCULAR | Status: AC | PRN
  Administered 2016-03-14: .5 mL

## 2016-03-14 MED ORDER — METHYLPREDNISOLONE ACETATE 40 MG/ML IJ SUSP
20.0000 mg | INTRAMUSCULAR | Status: AC | PRN
  Administered 2016-03-14: 20 mg via INTRA_ARTICULAR

## 2016-03-14 NOTE — Telephone Encounter (Signed)
I reached out to the patient today spoke to her and she said she would call [facility] when she came back from her doctors appointment

## 2016-03-14 NOTE — Progress Notes (Signed)
Office Visit Note   Patient: Heather Frederick           Date of Birth: 02/18/1956           MRN: [id number] Visit Date: 03/14/2016              Requested by: Hoyt Koch, MD [place], [place] [id number] PCP: Hoyt Koch, MD   Assessment & Plan: Visit Diagnoses: Osteoarthritis metatarsal phalangeal joint right great toe. We have had a long discussion regarding different surgical options and we'll proceed with a cortisone.  Plan: Cortisone injection and follow-up as needed  Follow-Up Instructions: No Follow-up on file.   Orders:  No orders of the defined types were placed in this encounter.  No orders of the defined types were placed in this encounter.     Procedures: Small Joint Inj Date/Time: 03/14/2016 4:33 PM Performed by: Garald Balding Authorized by: Garald Balding   Consent Given by:  Patient Timeout: prior to procedure the correct patient, procedure, and site was verified   Indications:  Pain Location:  Great toe Site:  R great MTP Needle Size:  27 G Spinal Needle: No   Ultrasound Guided: No   Fluoroscopic Guidance: No   Medications:  0.5 mL lidocaine 1 %; 20 mg methylPREDNISolone acetate 40 MG/ML Aspiration Attempted: No        Clinical Data: No additional findings.   Subjective: No chief complaint on file.   Pt having problems again with her Right Great toe.     Review of Systems   Objective: Vital Signs: There were no vitals taken for this visit.  Physical Exam  Ortho Exam right great toe reveals a well-healed dorsal incision over the metatarsal phalangeal joint. The toe was not read hot or swollen. There was approximately 20 of dorsiflexion and 15 of plantarflexion across the joint with some mild crepitation. Minimal tenderness. No bunion  or palpable dorsal osteophytes. Toe was nicely aligned. Neurovascular exam intact  Specialty Comments:  No specialty comments available.  Imaging: No  results found.   PMFS History: Patient Active Problem List   Diagnosis Date Noted  . Edema extremities 03/07/2016  . RUQ pain 01/27/2016  . Concussion with no loss of consciousness 08/27/2015  . Depression 01/28/2015  . Abdominal pain, epigastric   . Bile duct stone   . Elevated LFTs   . Benign essential HTN 09/23/2014  . Routine general medical examination at a health care facility 08/01/2014  . OSA (obstructive sleep apnea) 07/04/2014  . Dyslipidemia 03/09/2014  . Diastolic heart failure, stage B   . GERD (gastroesophageal reflux disease)   . Meralgia paresthetica of right side 10/02/2012  . Osteoarthritis of left knee 09/12/2012  . Migraines 09/12/2012  . Severe obesity (BMI >= 40) ([place]) 09/12/2012   Past Medical History:  Diagnosis Date  . Allergy   . Anxiety   . Arthritis   . Benign essential HTN 09/23/2014  . Chronic kidney disease    uti  . Depression   . Dyslipidemia   . Elevated liver enzymes   . Family history of anesthesia complication    father has a severe hard time waking up  . Gallstones    a. Seen on CT 01/2014.  Marland Kitchen GERD (gastroesophageal reflux disease)   . Hard of hearing   . Hepatic steatosis   . History of frequent urinary tract infections   . Hyperlipidemia   . Hypertension   . Lateral epicondylitis  of right elbow   . Mental disorder   . Meralgia paresthetica of right side 10/02/2012   slight at 05/2014  . Migraine headache   . Obesity   . OSA (obstructive sleep apnea)    severe with AHI 37/hr now on CPAP at 18cm H2O  . Osteoarthritis   . Raynaud disease    in feet per patient   . Sleep apnea    wears c-pap  . Urinary tract infection     Family History  Problem Relation Age of Onset  . Hypertension Mother   . Stroke Mother   . Liver disease Mother     Abcess  . Hypertension Father   . Coronary artery disease Father   . Migraines Father   . Clotting disorder Father   . Kidney failure Brother   . Hypertension Brother   . Migraines  Brother   . Migraines Daughter   . Breast cancer Other     Niece with breast cancer  . Colon cancer Paternal Grandmother   . Pancreatic cancer Paternal Grandmother   . Stomach cancer Paternal Grandmother   . Breast cancer Cousin   . Esophageal cancer Neg Hx   . Rectal cancer Neg Hx     Past Surgical History:  Procedure Laterality Date  . ABDOMINAL HYSTERECTOMY  1/04   partial  . BLADDER SUSPENSION  6/10  . CARDIAC CATHETERIZATION    . carpel tunnel left/right  7/08, 8/08 Bilateral 8/08 and 7/08  . carpel tunnel rel Right 4/12  . CHOLECYSTECTOMY N/A 06/11/2014   Procedure: LAPAROSCOPIC CHOLECYSTECTOMY WITH ATTEMPTED INTRAOPERATIVE CHOLANGIOGRAM;  Surgeon: Jackolyn Confer, MD;  Location: [facility];  Service: General;  Laterality: N/A;  . COLONOSCOPY    . ERCP N/A 10/19/2014   Procedure: ENDOSCOPIC RETROGRADE CHOLANGIOPANCREATOGRAPHY (ERCP);  Surgeon: Ladene Artist, MD;  Location: [facility];  Service: Endoscopy;  Laterality: N/A;  . JOINT REPLACEMENT    . KNEE ARTHROSCOPY Left 12/12  . KNEE ARTHROSCOPY Right 12/06  . LEFT HEART CATHETERIZATION WITH CORONARY ANGIOGRAM N/A 03/10/2014   Procedure: LEFT HEART CATHETERIZATION WITH CORONARY ANGIOGRAM;  Surgeon: Sinclair Grooms, MD;  Location[facility];  Service: Cardiovascular;  Laterality: N/A;  . PLANTAR FASCIA RELEASE Right 12/10  . radial tunnel release     right arm   . ROTATOR CUFF REPAIR Left 6/11  . tennis elbow release Right 7/04  . TOTAL KNEE ARTHROPLASTY Left 09/10/2012   Procedure: TOTAL KNEE ARTHROPLASTY- left;  Surgeon: Garald Balding, MD;  Location: [facility];  Service: Orthopedics;  Laterality: Left;  Left total knee arthroplasty   Social History   Occupational History  . [employer] History Main Topics  . Smoking status: Never Smoker  . Smokeless tobacco: Never Used     Comment: Secondhand - from family growing up, workplace intermittently  . Alcohol use 0.0 oz/week     Comment:  occasionally - intermittent, no more than twice a week  . Drug use: No  . Sexual activity: Not on file

## 2016-03-14 NOTE — Telephone Encounter (Signed)
-----   Message from [place] sent at 03/14/2016 10:01 AM EST ----- Regarding: RE: auto-titration I've reached out to our RT Dpt to see if they can make contact with the pt.  Thanks! ----- Message ----- From: Freada Bergeron, CMA Sent: 03/13/2016   5:46 PM To: Darlina Guys Subject: FW: auto-titration                             Thanks ----- Message ----- From: Theodoro Parma, RN Sent: 03/13/2016   3:57 PM To: Freada Bergeron, CMA Subject: auto-titration                                 Please contact [place] and get auto-titration started ASAP - she was seen in the office last week and no one has called.  Thanks!

## 2016-04-03 ENCOUNTER — Ambulatory Visit (INDEPENDENT_AMBULATORY_CARE_PROVIDER_SITE_OTHER): Payer: 59 | Admitting: Internal Medicine

## 2016-04-03 ENCOUNTER — Encounter: Payer: Self-pay | Admitting: Internal Medicine

## 2016-04-03 DIAGNOSIS — K8041 Calculus of bile duct with cholecystitis, unspecified, with obstruction: Secondary | ICD-10-CM

## 2016-04-03 DIAGNOSIS — K429 Umbilical hernia without obstruction or gangrene: Secondary | ICD-10-CM | POA: Insufficient documentation

## 2016-04-03 NOTE — Patient Instructions (Signed)
Umbilical Hernia, Adult  A hernia is a bulge of tissue that pushes through an opening between muscles. An umbilical hernia happens in the abdomen, near the belly button (umbilicus). The hernia may contain tissues from the small intestine, large intestine, or fatty tissue covering the intestines (omentum). Umbilical hernias in adults tend to get worse over time, and they require surgical treatment.  There are several types of umbilical hernias. You may have:  · A hernia located just above or below the umbilicus (indirect hernia). This is the most common type of umbilical hernia in adults.  · A hernia that forms through an opening formed by the umbilicus (direct hernia).  · A hernia that comes and goes (reducible hernia). A reducible hernia may be visible only when you strain, lift something heavy, or cough. This type of hernia can be pushed back into the abdomen (reduced).  · A hernia that traps abdominal tissue inside the hernia (incarcerated hernia). This type of hernia cannot be reduced.  · A hernia that cuts off blood flow to the tissues inside the hernia (strangulated hernia). The tissues can start to die if this happens. This type of hernia requires emergency treatment.    What are the causes?  An umbilical hernia happens when tissue inside the abdomen presses on a weak area of the abdominal muscles.  What increases the risk?  You may have a greater risk of this condition if you:  · Are obese.  · Have had several pregnancies.  · Have a buildup of fluid inside your abdomen (ascites).  · Have had surgery that weakens the abdominal muscles.    What are the signs or symptoms?  The main symptom of this condition is a painless bulge at or near the belly button. A reducible hernia may be visible only when you strain, lift something heavy, or cough. Other symptoms may include:  · Dull pain.  · A feeling of pressure.    Symptoms of a strangulated hernia may include:  · Pain that gets increasingly worse.  · Nausea and  vomiting.  · Pain when pressing on the hernia.  · Skin over the hernia becoming red or purple.  · Constipation.  · Blood in the stool.    How is this diagnosed?  This condition may be diagnosed based on:  · A physical exam. You may be asked to cough or strain while standing. These actions increase the pressure inside your abdomen and force the hernia through the opening in your muscles. Your health care provider may try to reduce the hernia by pressing on it.  · Your symptoms and medical history.    How is this treated?  Surgery is the only treatment for an umbilical hernia. Surgery for a strangulated hernia is done as soon as possible. If you have a small hernia that is not incarcerated, you may need to lose weight before having surgery.  Follow these instructions at home:  · Lose weight, if told by your health care provider.  · Do not try to push the hernia back in.  · Watch your hernia for any changes in color or size. Tell your health care provider if any changes occur.  · You may need to avoid activities that increase pressure on your hernia.  · Do not lift anything that is heavier than 10 lb (4.5 kg) until your health care provider says that this is safe.  · Take over-the-counter and prescription medicines only as told by your health care provider.  ·   Keep all follow-up visits as told by your health care provider. This is important.  Contact a health care provider if:  · Your hernia gets larger.  · Your hernia becomes painful.  Get help right away if:  · You develop sudden, severe pain near the area of your hernia.  · You have pain as well as nausea or vomiting.  · You have pain and the skin over your hernia changes color.  · You develop a fever.  This information is not intended to replace advice given to you by your health care provider. Make sure you discuss any questions you have with your health care provider.  Document Released: 08/13/2015 Document Revised: 11/14/2015 Document Reviewed:  08/13/2015  Elsevier Interactive Patient Education © 2017 [employer]

## 2016-04-03 NOTE — Progress Notes (Signed)
Pre visit review using our clinic review tool, if applicable. No additional management support is needed unless otherwise documented below in the visit note. 

## 2016-04-03 NOTE — Assessment & Plan Note (Signed)
  On diet  

## 2016-04-03 NOTE — Progress Notes (Signed)
Subjective:  Patient ID: Heather Frederick, female    DOB: 1955-08-27  Age: 61 y.o. MRN: [id number]  CC: Cyst (on naval since this weekend. Not painful to touch)   HPI [name] presents for a umbilical enlargement x 2 days. No pain. No n/v. H/o gallstones   Outpatient Medications Prior to Visit  Medication Sig Dispense Refill  . acetaminophen (TYLENOL) 500 MG tablet Take 800 mg by mouth every 8 (eight) hours as needed for mild pain or headache.     Marland Kitchen aspirin 81 MG tablet Take 81 mg by mouth daily.    . Cholecalciferol (VITAMIN D-3) 1000 UNITS CAPS Take 2,000 Units by mouth every morning.     . citalopram (CELEXA) 20 MG tablet TAKE 1 TABLET BY MOUTH EVERY DAY 30 tablet 3  . CRANBERRY-VITAMIN C PO Take 4,200 mg by mouth every morning.     . cyclobenzaprine (FLEXERIL) 5 MG tablet     . eletriptan (RELPAX) 40 MG tablet Take 1 tablet (40 mg total) by mouth every 2 (two) hours as needed for migraine. 8 tablet 8  . hyoscyamine (LEVSIN SL) 0.125 MG SL tablet Place 1 tablet (0.125 mg total) under the tongue every 4 (four) hours as needed. 30 tablet 1  . loratadine (CLARITIN) 10 MG tablet Take 10 mg by mouth 2 (two) times daily as needed for allergies.     . Lutein-Zeaxanthin 25-5 MG CAPS Take 1 capsule by mouth every morning.     . Methylcobalamin (METHYL B-12 PO) Take 2,500 mcg by mouth every morning.     . metoprolol succinate (TOPROL-XL) 25 MG 24 hr tablet     . Multiple Vitamin (MULTIVITAMIN WITH MINERALS) TABS Take 1 tablet by mouth daily.     . nitrofurantoin (MACRODANTIN) 100 MG capsule Take 100 mg by mouth daily.     . nitroGLYCERIN (NITROSTAT) 0.4 MG SL tablet Place 1 tablet (0.4 mg total) under the tongue every 5 (five) minutes as needed for chest pain. 25 tablet 5  . Omega-3 Fatty Acids (FISH OIL PO) Take 1,200 mg by mouth every morning.     Marland Kitchen oxybutynin (DITROPAN-XL) 10 MG 24 hr tablet Take 10 mg by mouth every morning.     . pantoprazole (PROTONIX) 40 MG tablet TAKE ONE (1)  TABLET BY MOUTH TWO (2) TIMES DAILY 60 tablet 5  . pregabalin (LYRICA) 200 MG capsule TAKE ONE (1) CAPSULE BY MOUTH EVERY MORNING 30 capsule 5  . ranitidine (ZANTAC) 300 MG tablet TAKE ONE TABLET BY MOUTH EVERY EVENING 30 tablet 11  . simvastatin (ZOCOR) 20 MG tablet TAKE ONE (1) TABLET BY MOUTH EVERY DAY 90 tablet 3  . spironolactone (ALDACTONE) 25 MG tablet TAKE ONE (1) TABLET BY MOUTH EVERY DAY 90 tablet 3  . amLODipine (NORVASC) 2.5 MG tablet Take 1 tablet (2.5 mg total) by mouth daily. 90 tablet 3   No facility-administered medications prior to visit.     ROS Review of Systems  Constitutional: Negative for activity change, appetite change, chills, fatigue and unexpected weight change.  HENT: Negative for congestion, mouth sores and sinus pressure.   Eyes: Negative for visual disturbance.  Respiratory: Negative for cough and chest tightness.   Gastrointestinal: Positive for abdominal distention. Negative for abdominal pain, nausea and vomiting.  Genitourinary: Negative for difficulty urinating, frequency and vaginal pain.  Musculoskeletal: Negative for back pain and gait problem.  Skin: Negative for pallor and rash.  Neurological: Negative for dizziness, tremors, weakness, numbness and headaches.  Psychiatric/Behavioral: Negative for confusion and sleep disturbance.    Objective:  BP 100/62   Pulse 73   Temp 97.8 F (36.6 C) (Oral)   Wt (!) 301 lb (136.5 kg)   SpO2 94%   BMI 50.09 kg/m   BP Readings from Last 3 Encounters:  04/03/16 100/62  03/14/16 111/60  03/07/16 118/70    Wt Readings from Last 3 Encounters:  04/03/16 (!) 301 lb (136.5 kg)  03/14/16 300 lb (136.1 kg)  03/07/16 (!) 304 lb (137.9 kg)    Physical Exam  Constitutional: She appears well-developed. No distress.  HENT:  Head: Normocephalic.  Right Ear: External ear normal.  Left Ear: External ear normal.  Nose: Nose normal.  Mouth/Throat: Oropharynx is clear and moist.  Eyes: Conjunctivae are  normal. Pupils are equal, round, and reactive to light. Right eye exhibits no discharge. Left eye exhibits no discharge.  Neck: Normal range of motion. Neck supple. No JVD present. No tracheal deviation present. No thyromegaly present.  Cardiovascular: Normal rate, regular rhythm and normal heart sounds.   Pulmonary/Chest: No stridor. No respiratory distress. She has no wheezes.  Abdominal: Soft. Bowel sounds are normal. She exhibits mass. She exhibits no distension. There is no tenderness. There is no rebound and no guarding.  Musculoskeletal: She exhibits no edema or tenderness.  Lymphadenopathy:    She has no cervical adenopathy.  Neurological: She displays normal reflexes. No cranial nerve deficit. She exhibits normal muscle tone. Coordination normal.  Skin: No rash noted. No erythema.  Psychiatric: She has a normal mood and affect. Her behavior is normal. Judgment and thought content normal.  5 cm umbil hernia  Lab Results  Component Value Date   WBC 7.0 12/30/2015   HGB 15.0 12/30/2015   HCT 43.6 12/30/2015   PLT 186.0 12/30/2015   GLUCOSE 97 12/30/2015   CHOL 146 07/28/2015   TRIG 133.0 07/28/2015   HDL 31.30 (L) 07/28/2015   LDLCALC 88 07/28/2015   ALT 30 12/30/2015   AST 20 12/30/2015   NA 141 12/30/2015   K 4.0 12/30/2015   CL 104 12/30/2015   CREATININE 0.91 12/30/2015   BUN 15 12/30/2015   CO2 30 12/30/2015   INR 1.10 06/09/2014   HGBA1C 5.9 07/28/2015    Mm Screening Breast Tomo Bilateral  Result Date: 02/29/2016 CLINICAL DATA:  Screening. EXAM: 2D DIGITAL SCREENING BILATERAL MAMMOGRAM WITH CAD AND ADJUNCT TOMO COMPARISON:  Previous exam(s). ACR Breast Density Category b: There are scattered areas of fibroglandular density. FINDINGS: There are no findings suspicious for malignancy. Images were processed with CAD. IMPRESSION: No mammographic evidence of malignancy. A result letter of this screening mammogram will be mailed directly to the patient. RECOMMENDATION:  Screening mammogram in one year. (Code:SM-B-01Y) BI-RADS CATEGORY  1: Negative. Electronically Signed   By: Curlene Dolphin M.D.   On: 02/29/2016 14:47    Assessment & Plan:   There are no diagnoses linked to this encounter. I am having Ms. Hochman maintain her loratadine, multivitamin with minerals, Lutein-Zeaxanthin, oxybutynin, Vitamin D-3, CRANBERRY-VITAMIN C PO, Methylcobalamin (METHYL B-12 PO), Omega-3 Fatty Acids (FISH OIL PO), acetaminophen, aspirin, nitroGLYCERIN, nitrofurantoin, eletriptan, pregabalin, amLODipine, hyoscyamine, cyclobenzaprine, metoprolol succinate, ranitidine, citalopram, pantoprazole, simvastatin, and spironolactone.  No orders of the defined types were placed in this encounter.    Follow-up: No Follow-up on file.  Walker Kehr, MD

## 2016-04-03 NOTE — Assessment & Plan Note (Signed)
Discussed w/pt Surg ref Dr Barkley Bruns

## 2016-04-04 ENCOUNTER — Ambulatory Visit: Payer: 59 | Admitting: Internal Medicine

## 2016-04-12 ENCOUNTER — Encounter: Payer: Self-pay | Admitting: Cardiology

## 2016-04-16 ENCOUNTER — Encounter: Payer: Self-pay | Admitting: Cardiology

## 2016-04-17 ENCOUNTER — Ambulatory Visit: Payer: Self-pay | Admitting: General Surgery

## 2016-04-17 NOTE — H&P (Signed)
Heather Frederick 04/17/2016 3:27 PM Location: [facility] Patient #: [id number] DOB: 07-26-1955 Married / Language: Heather Frederick / Race: White Female  History of Present Illness Heather Hollingshead MD; 04/17/2016 3:51 PM) The patient is a 61 year old female.   Note:She is referred by Dr. Alain Frederick for evaluation of an umbilical hernia. She underwent a laparoscopic cholecystectomy about 2 years ago. Recently, she noted a small bulge around her umbilicus that is progressively enlarged. No pain or discomfort from this. She is sent here for discussion of possible surgical repair. Since her cholecystectomy, she's had postcholecystectomy diarrhea.  Allergies Heather Frederick [place], [place]; 04/17/2016 3:28 PM) Topamax *ANTICONVULSANTS* Advil *ANALGESICS - ANTI-INFLAMMATORY* Aleve *ANALGESICS - ANTI-INFLAMMATORY* Sulfabenzamide *CHEMICALS* Echinacea *ALTERNATIVE MEDICINES* Bee Pollen *NUTRIENTS*  Medication History Heather Frederick, Frederick; 04/17/2016 3:28 PM) Lyrica (200MG  Capsule, Oral) Active. Citalopram Hydrobromide (20MG  Tablet, Oral) Active. Metoprolol Succinate ER (25MG  Tablet ER 24HR, Oral) Active. Pantoprazole Sodium (40MG  Tablet DR, Oral) Active. Simvastatin (20MG  Tablet, Oral) Active. Relpax (40MG  Tablet, Oral) Active. Nitrostat (0.3MG  Tab Sublingual, Sublingual) Active. Loratadine (10MG  Tablet, Oral) Active. Fish Oil Active. Vitamin D3 High Potency (1000UNIT Capsule, Oral) Active. Aspirin EC (81MG  Tablet DR, Oral) Active. Cranberry (125MG  Tablet, Oral) Active. Multivitamins (Oral) Active. Spironolactone (25MG  Tablet, Oral) Active. Methyl B-12 (1000MCG Lozenge, Oral) Active. Medications Reconciled    Vitals Heather Frederick Heather Frederick; 04/17/2016 3:28 PM) 04/17/2016 3:28 PM Weight: 301.6 lb Height: 65in Body Surface Area: 2.36 m Body Mass Index: 50.19 kg/m  Temp.: 98.85F  Pulse: 97 (Regular)  BP: 132/74 (Sitting, Left Arm, Standard)      Physical  Exam Heather Hollingshead MD; 04/17/2016 3:54 PM)  The physical exam findings are as follows: Note:GENERAL APPEARANCE: Super morbidly obese female in NAD. Pleasant and cooperative.  EARS, NOSE, MOUTH THROAT: [place]/AT external ears: no lesions or deformities external nose: no lesions or deformities hearing: grossly normal lips: moist, no deformities EYES external: conjunctiva, lids, sclerae normal pupils: equal, round  CV ascultation: RRR, no murmur  RESP auscultation: breath sounds equal and clear respiratory effort: normal  GASTROINTESTINAL abdomen: Soft, obese non-tender, non-distended, no masses liver and spleen: not enlarged. hernia: present at site on subumbilical incision scar: multiple small scars  MUSCULOSKELETAL station and gait: normal digits/nails: no clubbing or cyanosis  NEUROLOGIC speech: normal  PSYCHIATRIC alertness and orientation: normal mood/affect/behavior: normal judgement and insight: normal    Assessment & Plan Heather Hollingshead MD; 04/17/2016 3:50 PM)  INCISIONAL HERNIA, WITHOUT OBSTRUCTION OR GANGRENE (K43.2) Impression: This is getting larger. She is interested in repair.  Plan: Given her body habitus, I recommended laparoscopic repair of the incisional hernia with mesh rather than open repair. I have discussed the procedure, risks, and aftercare. Risks include but are not limited to bleeding, infection, wound healing problems, anesthesia, recurrence, accidental injury to intra-abdominal organs-such as intestine, liver, spleen, bladder, etc. We also discussed the rare complication of mesh rejection. All questions were answered.  Heather Frederick, M.D.

## 2016-04-20 ENCOUNTER — Other Ambulatory Visit: Payer: Self-pay | Admitting: *Deleted

## 2016-04-20 DIAGNOSIS — G4733 Obstructive sleep apnea (adult) (pediatric): Secondary | ICD-10-CM

## 2016-04-28 ENCOUNTER — Telehealth: Payer: Self-pay | Admitting: *Deleted

## 2016-04-28 NOTE — Telephone Encounter (Signed)
Called and left message for patient to return my call.

## 2016-05-01 ENCOUNTER — Ambulatory Visit (INDEPENDENT_AMBULATORY_CARE_PROVIDER_SITE_OTHER): Payer: 59

## 2016-05-01 ENCOUNTER — Ambulatory Visit (INDEPENDENT_AMBULATORY_CARE_PROVIDER_SITE_OTHER): Payer: 59 | Admitting: Orthopaedic Surgery

## 2016-05-01 ENCOUNTER — Encounter (INDEPENDENT_AMBULATORY_CARE_PROVIDER_SITE_OTHER): Payer: Self-pay | Admitting: Orthopaedic Surgery

## 2016-05-01 VITALS — BP 111/60 | HR 80 | Resp 18 | Ht 65.5 in | Wt 299.0 lb

## 2016-05-01 DIAGNOSIS — M25551 Pain in right hip: Secondary | ICD-10-CM

## 2016-05-01 NOTE — Progress Notes (Signed)
Office Visit Note   Patient: Heather Frederick           Date of Birth: [birth year]           MRN: [id number] Visit Date: 05/01/2016              Requested by: Hoyt Koch, MD [place], [place] [id number] PCP: Hoyt Koch, MD   Assessment & Plan: Visit Diagnoses: Right hip pain consistent with mild osteoarthritis by x-ray  Plan: Consult Dr. Ernestina Patches for consideration of intra-articular cortisone injection right hip   Follow-Up Instructions: No Follow-up on file.   Orders:  No orders of the defined types were placed in this encounter.  No orders of the defined types were placed in this encounter.     Procedures: No procedures performed   Clinical Data: No additional findings.   Subjective: Chief Complaint  Patient presents with  . Right Hip - Pain    Pt presents with chronic Right hip pain that woke her up this am. She states she could not move her leg, and husband had to help her she was in so much pain. Pt has seen Dr. Ernestina Patches for an  Injection in her back in May 2017. She relates it took 2 months for her to get any relief from that.   [name] was seen in August 2017. I injected the greater trochanteric region of her right hip with some relief of her pain. She denies any history of injury or trauma. Pain originates along the lateral aspect of her right hip but does radiate into her groin and into the anterior thigh.  Review of Systems   Objective: Vital Signs: There were no vitals taken for this visit.  Physical Exam  Ortho Exam minimal discomfort along the lateral aspect of the right hip. Definite pain with internal and external rotation of her right hip with very mild limitation of motion. Straight leg raise is negative bilaterally.  Specialty Comments:  No specialty comments available.  Imaging: No results found.   PMFS History: Patient Active Problem List   Diagnosis Date Noted  . Umbilical hernia 0000000  . Edema  extremities 03/07/2016  . RUQ pain 01/27/2016  . Concussion with no loss of consciousness 08/27/2015  . Depression 01/28/2015  . Abdominal pain, epigastric   . Bile duct stone   . Elevated LFTs   . Benign essential HTN 09/23/2014  . Routine general medical examination at a health care facility 08/01/2014  . OSA (obstructive sleep apnea) 07/04/2014  . Dyslipidemia 03/09/2014  . Diastolic heart failure, stage B   . GERD (gastroesophageal reflux disease)   . Meralgia paresthetica of right side 10/02/2012  . Osteoarthritis of left knee 09/12/2012  . Migraines 09/12/2012  . Severe obesity (BMI >= 40) ([place]) 09/12/2012   Past Medical History:  Diagnosis Date  . Allergy   . Anxiety   . Arthritis   . Benign essential HTN 09/23/2014  . Chronic kidney disease    uti  . Depression   . Dyslipidemia   . Elevated liver enzymes   . Family history of anesthesia complication    father has a severe hard time waking up  . Gallstones    a. Seen on CT 01/2014.  Marland Kitchen GERD (gastroesophageal reflux disease)   . Hard of hearing   . Hepatic steatosis   . History of frequent urinary tract infections   . Hyperlipidemia   . Hypertension   . Lateral epicondylitis of  right elbow   . Mental disorder   . Meralgia paresthetica of right side 10/02/2012   slight at 05/2014  . Migraine headache   . Obesity   . OSA (obstructive sleep apnea)    severe with AHI 37/hr now on CPAP at 18cm H2O  . Osteoarthritis   . Raynaud disease    in feet per patient   . Sleep apnea    wears c-pap  . Urinary tract infection     Family History  Problem Relation Age of Onset  . Hypertension Mother   . Stroke Mother   . Liver disease Mother     Abcess  . Hypertension Father   . Coronary artery disease Father   . Migraines Father   . Clotting disorder Father   . Kidney failure Brother   . Hypertension Brother   . Migraines Brother   . Migraines Daughter   . Breast cancer Other     Niece with breast cancer  . Colon  cancer Paternal Grandmother   . Pancreatic cancer Paternal Grandmother   . Stomach cancer Paternal Grandmother   . Breast cancer Cousin   . Esophageal cancer Neg Hx   . Rectal cancer Neg Hx     Past Surgical History:  Procedure Laterality Date  . ABDOMINAL HYSTERECTOMY  1/04   partial  . BLADDER SUSPENSION  6/10  . CARDIAC CATHETERIZATION    . carpel tunnel left/right  7/08, 8/08 Bilateral 8/08 and 7/08  . carpel tunnel rel Right 4/12  . CHOLECYSTECTOMY N/A 06/11/2014   Procedure: LAPAROSCOPIC CHOLECYSTECTOMY WITH ATTEMPTED INTRAOPERATIVE CHOLANGIOGRAM;  Surgeon: Jackolyn Confer, MD;  Location: [facility];  Service: General;  Laterality: N/A;  . COLONOSCOPY    . ERCP N/A 10/19/2014   Procedure: ENDOSCOPIC RETROGRADE CHOLANGIOPANCREATOGRAPHY (ERCP);  Surgeon: Ladene Artist, MD;  Location: [facility];  Service: Endoscopy;  Laterality: N/A;  . JOINT REPLACEMENT    . KNEE ARTHROSCOPY Left 12/12  . KNEE ARTHROSCOPY Right 12/06  . LEFT HEART CATHETERIZATION WITH CORONARY ANGIOGRAM N/A 03/10/2014   Procedure: LEFT HEART CATHETERIZATION WITH CORONARY ANGIOGRAM;  Surgeon: Sinclair Grooms, MD;  Location: [facility] CATH LAB;  Service: Cardiovascular;  Laterality: N/A;  . PLANTAR FASCIA RELEASE Right 12/10  . radial tunnel release     right arm   . ROTATOR CUFF REPAIR Left 6/11  . tennis elbow release Right 7/04  . TOTAL KNEE ARTHROPLASTY Left 09/10/2012   Procedure: TOTAL KNEE ARTHROPLASTY- left;  Surgeon: Garald Balding, MD;  Location: [facility];  Service: Orthopedics;  Laterality: Left;  Left total knee arthroplasty   Social History   Occupational History  . [place] History Main Topics  . Smoking status: Never Smoker  . Smokeless tobacco: Never Used     Comment: Secondhand - from family growing up, workplace intermittently  . Alcohol use 0.0 oz/week     Comment: occasionally - intermittent, no more than twice a week  . Drug use: No  . Sexual activity: Not on  file

## 2016-05-05 ENCOUNTER — Ambulatory Visit (INDEPENDENT_AMBULATORY_CARE_PROVIDER_SITE_OTHER): Payer: 59 | Admitting: Physical Medicine and Rehabilitation

## 2016-05-06 ENCOUNTER — Observation Stay (HOSPITAL_COMMUNITY)
Admission: EM | Admit: 2016-05-06 | Discharge: 2016-05-07 | Disposition: A | Discharge status: Home / Self Care | Payer: 59 | Attending: Internal Medicine | Admitting: Internal Medicine

## 2016-05-06 ENCOUNTER — Encounter (HOSPITAL_COMMUNITY): Payer: Self-pay | Admitting: Emergency Medicine

## 2016-05-06 DIAGNOSIS — Z87440 Personal history of urinary (tract) infections: Secondary | ICD-10-CM | POA: Diagnosis not present

## 2016-05-06 DIAGNOSIS — N189 Chronic kidney disease, unspecified: Secondary | ICD-10-CM | POA: Diagnosis not present

## 2016-05-06 DIAGNOSIS — I10 Essential (primary) hypertension: Secondary | ICD-10-CM | POA: Diagnosis present

## 2016-05-06 DIAGNOSIS — R531 Weakness: Secondary | ICD-10-CM | POA: Insufficient documentation

## 2016-05-06 DIAGNOSIS — E785 Hyperlipidemia, unspecified: Secondary | ICD-10-CM | POA: Insufficient documentation

## 2016-05-06 DIAGNOSIS — R112 Nausea with vomiting, unspecified: Principal | ICD-10-CM | POA: Insufficient documentation

## 2016-05-06 DIAGNOSIS — R197 Diarrhea, unspecified: Secondary | ICD-10-CM | POA: Insufficient documentation

## 2016-05-06 DIAGNOSIS — F329 Major depressive disorder, single episode, unspecified: Secondary | ICD-10-CM | POA: Insufficient documentation

## 2016-05-06 DIAGNOSIS — G4733 Obstructive sleep apnea (adult) (pediatric): Secondary | ICD-10-CM | POA: Insufficient documentation

## 2016-05-06 DIAGNOSIS — Z96652 Presence of left artificial knee joint: Secondary | ICD-10-CM | POA: Insufficient documentation

## 2016-05-06 DIAGNOSIS — Z79899 Other long term (current) drug therapy: Secondary | ICD-10-CM | POA: Diagnosis not present

## 2016-05-06 DIAGNOSIS — K219 Gastro-esophageal reflux disease without esophagitis: Secondary | ICD-10-CM | POA: Insufficient documentation

## 2016-05-06 DIAGNOSIS — I129 Hypertensive chronic kidney disease with stage 1 through stage 4 chronic kidney disease, or unspecified chronic kidney disease: Secondary | ICD-10-CM | POA: Diagnosis not present

## 2016-05-06 DIAGNOSIS — G43909 Migraine, unspecified, not intractable, without status migrainosus: Secondary | ICD-10-CM | POA: Insufficient documentation

## 2016-05-06 DIAGNOSIS — Z9989 Dependence on other enabling machines and devices: Secondary | ICD-10-CM | POA: Insufficient documentation

## 2016-05-06 DIAGNOSIS — Z7982 Long term (current) use of aspirin: Secondary | ICD-10-CM | POA: Insufficient documentation

## 2016-05-06 DIAGNOSIS — R110 Nausea: Secondary | ICD-10-CM | POA: Diagnosis present

## 2016-05-06 LAB — CBC WITH DIFFERENTIAL/PLATELET
BASOS PCT: 0 %
Basophils Absolute: 0 10*3/uL (ref 0.0–0.1)
EOS ABS: 0 10*3/uL (ref 0.0–0.7)
EOS PCT: 1 %
HCT: 44.3 % (ref 36.0–46.0)
HEMOGLOBIN: 15.2 g/dL — AB (ref 12.0–15.0)
Lymphocytes Relative: 25 %
Lymphs Abs: 0.9 10*3/uL (ref 0.7–4.0)
MCH: 29.7 pg (ref 26.0–34.0)
MCHC: 34.3 g/dL (ref 30.0–36.0)
MCV: 86.5 fL (ref 78.0–100.0)
MONO ABS: 0.5 10*3/uL (ref 0.1–1.0)
MONOS PCT: 12 %
NEUTROS PCT: 62 %
Neutro Abs: 2.3 10*3/uL (ref 1.7–7.7)
PLATELETS: 138 10*3/uL — AB (ref 150–400)
RBC: 5.12 MIL/uL — ABNORMAL HIGH (ref 3.87–5.11)
RDW: 13.1 % (ref 11.5–15.5)
WBC: 3.7 10*3/uL — ABNORMAL LOW (ref 4.0–10.5)

## 2016-05-06 LAB — URINALYSIS, ROUTINE W REFLEX MICROSCOPIC
BACTERIA UA: NONE SEEN
BILIRUBIN URINE: NEGATIVE
Glucose, UA: NEGATIVE mg/dL
Ketones, ur: 20 mg/dL — AB
LEUKOCYTES UA: NEGATIVE
NITRITE: NEGATIVE
PH: 8 (ref 5.0–8.0)
Protein, ur: NEGATIVE mg/dL
Specific Gravity, Urine: 1.009 (ref 1.005–1.030)

## 2016-05-06 LAB — COMPREHENSIVE METABOLIC PANEL
ALBUMIN: 3.6 g/dL (ref 3.5–5.0)
ALT: 42 U/L (ref 14–54)
ANION GAP: 8 (ref 5–15)
AST: 42 U/L — ABNORMAL HIGH (ref 15–41)
Alkaline Phosphatase: 57 U/L (ref 38–126)
BUN: 13 mg/dL (ref 6–20)
CHLORIDE: 107 mmol/L (ref 101–111)
CO2: 25 mmol/L (ref 22–32)
Calcium: 8.8 mg/dL — ABNORMAL LOW (ref 8.9–10.3)
Creatinine, Ser: 1.12 mg/dL — ABNORMAL HIGH (ref 0.44–1.00)
GFR calc non Af Amer: 53 mL/min — ABNORMAL LOW (ref >60)
GLUCOSE: 120 mg/dL — AB (ref 65–99)
POTASSIUM: 3.7 mmol/L (ref 3.5–5.1)
Sodium: 140 mmol/L (ref 135–145)
Total Bilirubin: 0.7 mg/dL (ref 0.3–1.2)
Total Protein: 6.7 g/dL (ref 6.5–8.1)

## 2016-05-06 LAB — LIPASE, BLOOD: Lipase: 19 U/L (ref 11–51)

## 2016-05-06 MED ORDER — CHOLESTYRAMINE 4 G PO PACK
4.0000 g | PACK | Freq: Two times a day (BID) | ORAL | Status: DC
  Administered 2016-05-07 (×2): 4 g via ORAL
  Filled 2016-05-06 (×2): qty 1

## 2016-05-06 MED ORDER — PREGABALIN 50 MG PO CAPS
200.0000 mg | ORAL_CAPSULE | Freq: Every day | ORAL | Status: DC
  Administered 2016-05-07: 200 mg via ORAL
  Filled 2016-05-06: qty 4

## 2016-05-06 MED ORDER — FAMOTIDINE 20 MG PO TABS: 40.0000 mg | ORAL_TABLET | Freq: Every day | ORAL | Status: DC

## 2016-05-06 MED ORDER — IBUPROFEN 200 MG PO TABS
600.0000 mg | ORAL_TABLET | Freq: Once | ORAL | Status: AC
  Administered 2016-05-06: 600 mg via ORAL
  Filled 2016-05-06: qty 3

## 2016-05-06 MED ORDER — OXYBUTYNIN CHLORIDE ER 10 MG PO TB24
10.0000 mg | ORAL_TABLET | Freq: Every morning | ORAL | Status: DC
  Administered 2016-05-07: 10 mg via ORAL
  Filled 2016-05-06: qty 1

## 2016-05-06 MED ORDER — LUTEIN-ZEAXANTHIN 25-5 MG PO CAPS: 1.0000 | ORAL_CAPSULE | Freq: Every morning | ORAL | Status: DC

## 2016-05-06 MED ORDER — ENOXAPARIN SODIUM 40 MG/0.4ML ~~LOC~~ SOLN: 40.0000 mg | SUBCUTANEOUS | Status: DC

## 2016-05-06 MED ORDER — PANTOPRAZOLE SODIUM 40 MG PO TBEC
40.0000 mg | DELAYED_RELEASE_TABLET | Freq: Two times a day (BID) | ORAL | Status: DC
  Administered 2016-05-07: 40 mg via ORAL
  Filled 2016-05-06: qty 1

## 2016-05-06 MED ORDER — HYDROCODONE-ACETAMINOPHEN 5-325 MG PO TABS
1.0000 | ORAL_TABLET | Freq: Once | ORAL | Status: AC
  Administered 2016-05-06: 1 via ORAL
  Filled 2016-05-06: qty 1

## 2016-05-06 MED ORDER — NITROFURANTOIN MACROCRYSTAL 100 MG PO CAPS
100.0000 mg | ORAL_CAPSULE | Freq: Every day | ORAL | Status: DC
  Administered 2016-05-07: 100 mg via ORAL
  Filled 2016-05-06: qty 1

## 2016-05-06 MED ORDER — SIMVASTATIN 10 MG PO TABS
20.0000 mg | ORAL_TABLET | Freq: Every day | ORAL | Status: DC
  Administered 2016-05-07: 20 mg via ORAL
  Filled 2016-05-06: qty 2

## 2016-05-06 MED ORDER — SODIUM CHLORIDE 0.9 % IV BOLUS (SEPSIS)
1000.0000 mL | Freq: Once | INTRAVENOUS | Status: AC
  Administered 2016-05-06: 1000 mL via INTRAVENOUS

## 2016-05-06 MED ORDER — LOPERAMIDE HCL 2 MG PO CAPS: 4.0000 mg | ORAL_CAPSULE | ORAL | Status: DC | PRN

## 2016-05-06 MED ORDER — CITALOPRAM HYDROBROMIDE 10 MG PO TABS
20.0000 mg | ORAL_TABLET | Freq: Every day | ORAL | Status: DC
  Administered 2016-05-07: 20 mg via ORAL
  Filled 2016-05-06: qty 2

## 2016-05-06 MED ORDER — DIPHENHYDRAMINE HCL 50 MG/ML IJ SOLN
25.0000 mg | Freq: Once | INTRAMUSCULAR | Status: AC
  Administered 2016-05-06: 25 mg via INTRAVENOUS
  Filled 2016-05-06: qty 1

## 2016-05-06 MED ORDER — ONDANSETRON HCL 4 MG/2ML IJ SOLN
4.0000 mg | Freq: Four times a day (QID) | INTRAMUSCULAR | Status: DC | PRN
Start: 2016-05-06 — End: 2016-05-07

## 2016-05-06 MED ORDER — METOCLOPRAMIDE HCL 5 MG/ML IJ SOLN
10.0000 mg | Freq: Once | INTRAMUSCULAR | Status: AC
  Administered 2016-05-06: 10 mg via INTRAVENOUS
  Filled 2016-05-06: qty 2

## 2016-05-06 MED ORDER — SODIUM CHLORIDE 0.9 % IV BOLUS (SEPSIS)
500.0000 mL | Freq: Once | INTRAVENOUS | Status: AC
  Administered 2016-05-06: 500 mL via INTRAVENOUS

## 2016-05-06 MED ORDER — SODIUM CHLORIDE 0.9 % IV SOLN
INTRAVENOUS | Status: DC
  Administered 2016-05-06 – 2016-05-07 (×3): via INTRAVENOUS

## 2016-05-06 MED ORDER — ONDANSETRON HCL 4 MG/2ML IJ SOLN
4.0000 mg | Freq: Once | INTRAMUSCULAR | Status: AC
  Administered 2016-05-06: 4 mg via INTRAVENOUS
  Filled 2016-05-06: qty 2

## 2016-05-06 MED ORDER — ACETAMINOPHEN 160 MG/5ML PO SOLN
650.0000 mg | ORAL | Status: DC | PRN
  Administered 2016-05-07 (×2): 650 mg via ORAL
  Filled 2016-05-06 (×2): qty 20.3

## 2016-05-06 MED ORDER — ASPIRIN 81 MG PO CHEW
81.0000 mg | CHEWABLE_TABLET | Freq: Every day | ORAL | Status: DC
  Administered 2016-05-07: 81 mg via ORAL
  Filled 2016-05-06: qty 1

## 2016-05-06 MED ORDER — LORATADINE 10 MG PO TABS: 10.0000 mg | ORAL_TABLET | Freq: Two times a day (BID) | ORAL | Status: DC | PRN

## 2016-05-06 MED ORDER — ELETRIPTAN HYDROBROMIDE 40 MG PO TABS
40.0000 mg | ORAL_TABLET | ORAL | Status: DC | PRN
  Filled 2016-05-06: qty 1

## 2016-05-06 NOTE — ED Provider Notes (Signed)
[facility] DEPT Provider Note   CSN: [id number] Arrival date & time: 05/06/16  1511     History   Chief Complaint Chief Complaint  Patient presents with  . Flu-like symptoms    HPI Heather Frederick is a 61 y.o. female who presents with generalized weakness, body aches, N/V. PMH significant for obesity, migraines, CHF EF 60-65% with grade 1 dd, HTN, HLD, frequent UTIs, anxiety/depression. She states that she started feeling acutely ill 2 days ago. Her initial symptoms were N/V/D. She started to have generalized weakness over the next day. They called EMS who gave her fluids and Tylenol and she was feeling a little better. She has been taking Loperamide as well. Today her symptoms worsened again and she started to have fever, chills, headache, lightheadedness, abdominal cramping. She also has chronic hip and back pain which has worsened. She denies ear pain, nasal congestion/rhinorrhea, sore throat, cough, SOB, chest pain, dysuria. She is s/p cholecystectomy and hysterectomy. She has not been able to take any of her medicines for 2 days and missed appointment for hip injection which has exacerbated her hip and back pain.  HPI  Past Medical History:  Diagnosis Date  . Allergy   . Anxiety   . Arthritis   . Benign essential HTN 09/23/2014  . Chronic kidney disease    uti  . Depression   . Dyslipidemia   . Elevated liver enzymes   . Family history of anesthesia complication    father has a severe hard time waking up  . Gallstones    a. Seen on CT 01/2014.  Marland Kitchen GERD (gastroesophageal reflux disease)   . Hard of hearing   . Hepatic steatosis   . History of frequent urinary tract infections   . Hyperlipidemia   . Hypertension   . Lateral epicondylitis of right elbow   . Mental disorder   . Meralgia paresthetica of right side 10/02/2012   slight at 05/2014  . Migraine headache   . Obesity   . OSA (obstructive sleep apnea)    severe with AHI 37/hr now on CPAP at 18cm H2O  .  Osteoarthritis   . Raynaud disease    in feet per patient   . Sleep apnea    wears c-pap  . Urinary tract infection     Patient Active Problem List   Diagnosis Date Noted  . Umbilical hernia 0000000  . Edema extremities 03/07/2016  . RUQ pain 01/27/2016  . Concussion with no loss of consciousness 08/27/2015  . Depression 01/28/2015  . Abdominal pain, epigastric   . Bile duct stone   . Elevated LFTs   . Benign essential HTN 09/23/2014  . Routine general medical examination at a health care facility 08/01/2014  . OSA (obstructive sleep apnea) 07/04/2014  . Dyslipidemia 03/09/2014  . Diastolic heart failure, stage B   . GERD (gastroesophageal reflux disease)   . Meralgia paresthetica of right side 10/02/2012  . Osteoarthritis of left knee 09/12/2012  . Migraines 09/12/2012  . Severe obesity (BMI >= 40) ([place]) 09/12/2012    Past Surgical History:  Procedure Laterality Date  . ABDOMINAL HYSTERECTOMY  1/04   partial  . BLADDER SUSPENSION  6/10  . CARDIAC CATHETERIZATION    . carpel tunnel left/right  7/08, 8/08 Bilateral 8/08 and 7/08  . carpel tunnel rel Right 4/12  . CHOLECYSTECTOMY N/A 06/11/2014   Procedure: LAPAROSCOPIC CHOLECYSTECTOMY WITH ATTEMPTED INTRAOPERATIVE CHOLANGIOGRAM;  Surgeon: Jackolyn Confer, MD;  Location: [facility];  Service: General;  Laterality: N/A;  . COLONOSCOPY    . ERCP N/A 10/19/2014   Procedure: ENDOSCOPIC RETROGRADE CHOLANGIOPANCREATOGRAPHY (ERCP);  Surgeon: Ladene Artist, MD;  Location: [facility];  Service: Endoscopy;  Laterality: N/A;  . JOINT REPLACEMENT    . KNEE ARTHROSCOPY Left 12/12  . KNEE ARTHROSCOPY Right 12/06  . LEFT HEART CATHETERIZATION WITH CORONARY ANGIOGRAM N/A 03/10/2014   Procedure: LEFT HEART CATHETERIZATION WITH CORONARY ANGIOGRAM;  Surgeon: Sinclair Grooms, MD;  Location: [facility] CATH LAB;  Service: Cardiovascular;  Laterality: N/A;  . PLANTAR FASCIA RELEASE Right 12/10  . radial tunnel release     right arm   . ROTATOR  CUFF REPAIR Left 6/11  . tennis elbow release Right 7/04  . TOTAL KNEE ARTHROPLASTY Left 09/10/2012   Procedure: TOTAL KNEE ARTHROPLASTY- left;  Surgeon: Garald Balding, MD;  Location: [facility];  Service: Orthopedics;  Laterality: Left;  Left total knee arthroplasty    OB History    No data available       Home Medications    Prior to Admission medications   Medication Sig Start Date End Date Taking? Authorizing Provider  acetaminophen (TYLENOL) 500 MG tablet Take 800 mg by mouth every 8 (eight) hours as needed for mild pain or headache.     Historical Provider, MD  amLODipine (NORVASC) 2.5 MG tablet Take 1 tablet (2.5 mg total) by mouth daily. 12/17/15 03/16/16  Liliane Shi, PA-C  amLODipine (NORVASC) 5 MG tablet  04/22/16   Historical Provider, MD  aspirin 81 MG tablet Take 81 mg by mouth daily.    Historical Provider, MD  Cholecalciferol (VITAMIN D-3) 1000 UNITS CAPS Take 2,000 Units by mouth every morning.     Historical Provider, MD  citalopram (CELEXA) 20 MG tablet TAKE 1 TABLET BY MOUTH EVERY DAY 01/20/16   Hoyt Koch, MD  CRANBERRY-VITAMIN C PO Take 4,200 mg by mouth every morning.     Historical Provider, MD  cyclobenzaprine (FLEXERIL) 5 MG tablet  11/15/15   Historical Provider, MD  eletriptan (RELPAX) 40 MG tablet Take 1 tablet (40 mg total) by mouth every 2 (two) hours as needed for migraine. 10/26/15   Dennie Bible, NP  hyoscyamine (LEVSIN SL) 0.125 MG SL tablet Place 1 tablet (0.125 mg total) under the tongue every 4 (four) hours as needed. 12/30/15   Levin Erp, PA  loratadine (CLARITIN) 10 MG tablet Take 10 mg by mouth 2 (two) times daily as needed for allergies.     Historical Provider, MD  Lutein-Zeaxanthin 25-5 MG CAPS Take 1 capsule by mouth every morning.     Historical Provider, MD  Methylcobalamin (METHYL B-12 PO) Take 2,500 mcg by mouth every morning.     Historical Provider, MD  metoprolol succinate (TOPROL-XL) 25 MG 24 hr tablet  11/30/15    Historical Provider, MD  Multiple Vitamin (MULTIVITAMIN WITH MINERALS) TABS Take 1 tablet by mouth daily.     Historical Provider, MD  nitrofurantoin (MACRODANTIN) 100 MG capsule Take 100 mg by mouth daily.  07/23/15   Historical Provider, MD  nitroGLYCERIN (NITROSTAT) 0.4 MG SL tablet Place 1 tablet (0.4 mg total) under the tongue every 5 (five) minutes as needed for chest pain. 06/28/15   Belva Crome, MD  Omega-3 Fatty Acids (FISH OIL PO) Take 1,200 mg by mouth every morning.     Historical Provider, MD  oxybutynin (DITROPAN-XL) 10 MG 24 hr tablet Take 10 mg by mouth every morning.  05/15/13  Historical Provider, MD  pantoprazole (PROTONIX) 40 MG tablet TAKE ONE (1) TABLET BY MOUTH TWO (2) TIMES DAILY 02/24/16   Ladene Artist, MD  pregabalin (LYRICA) 200 MG capsule TAKE ONE (1) CAPSULE BY MOUTH EVERY MORNING 10/26/15   Dennie Bible, NP  ranitidine (ZANTAC) 300 MG tablet TAKE ONE TABLET BY MOUTH EVERY EVENING 01/20/16   Ladene Artist, MD  simvastatin (ZOCOR) 20 MG tablet TAKE ONE (1) TABLET BY MOUTH EVERY DAY 02/29/16   Belva Crome, MD  spironolactone (ALDACTONE) 25 MG tablet TAKE ONE (1) TABLET BY MOUTH EVERY DAY 02/29/16   Belva Crome, MD    Family History Family History  Problem Relation Age of Onset  . Hypertension Mother   . Stroke Mother   . Liver disease Mother     Abcess  . Hypertension Father   . Coronary artery disease Father   . Migraines Father   . Clotting disorder Father   . Kidney failure Brother   . Hypertension Brother   . Migraines Brother   . Migraines Daughter   . Breast cancer Other     Niece with breast cancer  . Colon cancer Paternal Grandmother   . Pancreatic cancer Paternal Grandmother   . Stomach cancer Paternal Grandmother   . Breast cancer Cousin   . Esophageal cancer Neg Hx   . Rectal cancer Neg Hx     Social History Social History  Substance Use Topics  . Smoking status: Never Smoker  . Smokeless tobacco: Never Used     Comment:  Secondhand - from family growing up, workplace intermittently  . Alcohol use 0.0 oz/week     Comment: occasionally - intermittent, no more than twice a week     Allergies   Topamax [topiramate]; Aleve [naproxen sodium]; Bee venom; Echinacea; Other; Sulfa antibiotics; and Advil [ibuprofen]   Review of Systems Review of Systems  Constitutional: Positive for activity change, appetite change, chills, fatigue and fever.  HENT: Negative for congestion, ear pain, rhinorrhea and sore throat.   Eyes: Positive for photophobia.  Respiratory: Negative for cough and shortness of breath.   Cardiovascular: Negative for chest pain.  Gastrointestinal: Positive for abdominal pain, diarrhea, nausea and vomiting. Negative for blood in stool.  Genitourinary: Negative for dysuria.  Musculoskeletal: Positive for back pain.  Neurological: Positive for weakness, light-headedness and headaches. Negative for syncope.  All other systems reviewed and are negative.    Physical Exam Updated Vital Signs BP 101/74 (BP Location: Left Arm)   Pulse 63   Temp 97.7 F (36.5 C) (Oral)   Resp 22   Ht 5\' 5"  (1.651 m)   Wt 135.6 kg   SpO2 100%   BMI 49.76 kg/m   Physical Exam  Constitutional: She is oriented to person, place, and time. She appears well-developed and well-nourished. She is cooperative. She is easily aroused. She appears distressed.  HENT:  Head: Normocephalic and atraumatic.  Dry mucous membranes  Eyes: Conjunctivae are normal. Pupils are equal, round, and reactive to light. Right eye exhibits no discharge. Left eye exhibits no discharge. No scleral icterus.  Neck: Normal range of motion.  Cardiovascular: Normal rate and regular rhythm.  Exam reveals no gallop and no friction rub.   No murmur heard. Pulmonary/Chest: Effort normal and breath sounds normal. No respiratory distress. She has no wheezes. She has no rales. She exhibits no tenderness.  Abdominal: Soft. Bowel sounds are normal. She  exhibits no distension and no mass. There is tenderness.  There is no rebound and no guarding. A hernia (Umbilical ) is present.  Difficult exam due to body habitus. Generalized tenderness.  Neurological: She is alert, oriented to person, place, and time and easily aroused.  Skin: Skin is warm and dry.  Psychiatric: Her speech is normal and behavior is normal. She is not actively hallucinating. Thought content is not paranoid and not delusional. Cognition and memory are not impaired. She does not express impulsivity or inappropriate judgment. She expresses no homicidal and no suicidal ideation. She expresses no suicidal plans and no homicidal plans. She is attentive.  Nursing note and vitals reviewed.    ED Treatments / Results  Labs (all labs ordered are listed, but only abnormal results are displayed) Labs Reviewed  CBC WITH DIFFERENTIAL/PLATELET - Abnormal; Notable for the following:       Result Value   WBC 3.7 (*)    RBC 5.12 (*)    Hemoglobin 15.2 (*)    Platelets 138 (*)    All other components within normal limits  COMPREHENSIVE METABOLIC PANEL - Abnormal; Notable for the following:    Glucose, Bld 120 (*)    Creatinine, Ser 1.12 (*)    Calcium 8.8 (*)    AST 42 (*)    GFR calc non Af Amer 53 (*)    All other components within normal limits  URINALYSIS, ROUTINE W REFLEX MICROSCOPIC - Abnormal; Notable for the following:    Hgb urine dipstick MODERATE (*)    Ketones, ur 20 (*)    Squamous Epithelial / LPF 0-5 (*)    All other components within normal limits  LIPASE, BLOOD  INFLUENZA PANEL BY PCR (TYPE A & B)    EKG  EKG Interpretation None       Radiology No results found.  Procedures Procedures (including critical care time)  Medications Ordered in ED Medications  ibuprofen (ADVIL,MOTRIN) tablet 600 mg (not administered)  sodium chloride 0.9 % bolus 1,000 mL (0 mLs Intravenous Stopped 05/06/16 1751)  ondansetron (ZOFRAN) injection 4 mg (4 mg Intravenous  Given 05/06/16 1707)  metoCLOPramide (REGLAN) injection 10 mg (10 mg Intravenous Given 05/06/16 1848)  diphenhydrAMINE (BENADRYL) injection 25 mg (25 mg Intravenous Given 05/06/16 1848)  sodium chloride 0.9 % bolus 500 mL (0 mLs Intravenous Stopped 05/06/16 2006)  HYDROcodone-acetaminophen (NORCO/VICODIN) 5-325 MG per tablet 1 tablet (1 tablet Oral Given 05/06/16 2153)    Initial Impression / Assessment and Plan / ED Course  I have reviewed the triage vital signs and the nursing notes.  Pertinent labs & imaging results that were available during my care of the patient were reviewed by me and considered in my medical decision making (see chart for details).  61 year old female presents with flu-like symptoms vs GI bug. BP is initially soft. Otherwise vitals are normal. Labs are overall unremarkable - degree of hemoconcentration. UA remarkable for 20 ketones. IVF and Zofran ordered.   On recheck pt is still very uncomfortable and nauseous and complaining of headache. Will ordered Reglan and Benadryl. Will avoid Toradol for now due to CHF and mild bump in SCr.   After multiple rechecks patient is still very uncomfortable. She is unable to ambulate more than 2 or 3 steps. Due to profound weakness will ask hospitalist to evaluate.   Final Clinical Impressions(s) / ED Diagnoses   Final diagnoses:  Nausea vomiting and diarrhea    New Prescriptions New Prescriptions   No medications on file     Recardo Evangelist,  PA-C 05/07/16 [place], MD 05/15/16 1655

## 2016-05-06 NOTE — ED Notes (Signed)
Pt's oxygen saturation drops down to low 80's if sleeping; she states that she uses a CPAP at night.

## 2016-05-06 NOTE — ED Notes (Signed)
Attempted to call report.  Will call back in a few minutes.

## 2016-05-06 NOTE — ED Notes (Signed)
Patient aware of the need for urine. Says she is unable to ambulate or urinate.

## 2016-05-06 NOTE — ED Notes (Signed)
Bed: WA20 Expected date:  Expected time:  Means of arrival:  Comments: 60yo N/V/D x2 days

## 2016-05-06 NOTE — ED Triage Notes (Signed)
Per pt she has been having flu-like symptoms for 2 days (nausea, vomiting, diarrhea, fever, and body aches).  Pt states that she gets dizzy when she stands and cannot walk at all anymore.  Pt is alert, oriented, and speaking in full sentences but complains of shortness of breath.  Pt also c/o back pain.

## 2016-05-06 NOTE — H&P (Signed)
History and Physical    Heather Frederick [id number] DOB: Dec 13, 1955 DOA: 05/06/2016   PCP: Hoyt Koch, MD Chief Complaint:  Chief Complaint  Patient presents with  . Flu-like symptoms    HPI: Heather Frederick is a 61 y.o. female with medical history significant of HTN.  Patient presents to the ED with c/o N/V/D, generalized weakness, body aches.  Symptoms onset 2 days ago, worsening.  Loperamide didn't help much.  Fever, chills, headache as of today.  Unable to take meds for past 2 days due to N/V.  Symptoms are now severe.  Does have recent possible sick contacts (similar symptoms going through grandchildren's house).  ED Course: BP is borderline, IVF, nausea, pain, meds have failed to adequately control symptoms.  Review of Systems: As per HPI otherwise 10 point review of systems negative.    Past Medical History:  Diagnosis Date  . Allergy   . Anxiety   . Arthritis   . Benign essential HTN 09/23/2014  . Chronic kidney disease    uti  . Depression   . Dyslipidemia   . Elevated liver enzymes   . Family history of anesthesia complication    father has a severe hard time waking up  . Gallstones    a. Seen on CT 01/2014.  Marland Kitchen GERD (gastroesophageal reflux disease)   . Hard of hearing   . Hepatic steatosis   . History of frequent urinary tract infections   . Hyperlipidemia   . Hypertension   . Lateral epicondylitis of right elbow   . Mental disorder   . Meralgia paresthetica of right side 10/02/2012   slight at 05/2014  . Migraine headache   . Obesity   . OSA (obstructive sleep apnea)    severe with AHI 37/hr now on CPAP at 18cm H2O  . Osteoarthritis   . Raynaud disease    in feet per patient   . Sleep apnea    wears c-pap  . Urinary tract infection     Past Surgical History:  Procedure Laterality Date  . ABDOMINAL HYSTERECTOMY  1/04   partial  . BLADDER SUSPENSION  6/10  . CARDIAC CATHETERIZATION    . carpel tunnel left/right  7/08, 8/08 Bilateral  8/08 and 7/08  . carpel tunnel rel Right 4/12  . CHOLECYSTECTOMY N/A 06/11/2014   Procedure: LAPAROSCOPIC CHOLECYSTECTOMY WITH ATTEMPTED INTRAOPERATIVE CHOLANGIOGRAM;  Surgeon: Jackolyn Confer, MD;  Location: [facility];  Service: General;  Laterality: N/A;  . COLONOSCOPY    . ERCP N/A 10/19/2014   Procedure: ENDOSCOPIC RETROGRADE CHOLANGIOPANCREATOGRAPHY (ERCP);  Surgeon: Ladene Artist, MD;  Location: [facility];  Service: Endoscopy;  Laterality: N/A;  . JOINT REPLACEMENT    . KNEE ARTHROSCOPY Left 12/12  . KNEE ARTHROSCOPY Right 12/06  . LEFT HEART CATHETERIZATION WITH CORONARY ANGIOGRAM N/A 03/10/2014   Procedure: LEFT HEART CATHETERIZATION WITH CORONARY ANGIOGRAM;  Surgeon: Sinclair Grooms, MD;  Location: [facility] CATH LAB;  Service: Cardiovascular;  Laterality: N/A;  . PLANTAR FASCIA RELEASE Right 12/10  . radial tunnel release     right arm   . ROTATOR CUFF REPAIR Left 6/11  . tennis elbow release Right 7/04  . TOTAL KNEE ARTHROPLASTY Left 09/10/2012   Procedure: TOTAL KNEE ARTHROPLASTY- left;  Surgeon: Garald Balding, MD;  Location: [facility];  Service: Orthopedics;  Laterality: Left;  Left total knee arthroplasty     reports that she has never smoked. She has never used smokeless tobacco. She reports that she drinks  alcohol. She reports that she does not use drugs.  Allergies  Allergen Reactions  . Topamax [Topiramate] Other (See Comments)    :Stroke like symptoms  . Aleve [Naproxen Sodium] Hives    Has tolerated Voltaren topical as well as aspirin.  . Bee Venom Swelling  . Echinacea Hives  . Other Other (See Comments)    Feathers cause sinus congestion  . Sulfa Antibiotics Hives  . Advil [Ibuprofen] Hives    Has tolerated Voltaren topical as well as aspirin.    Family History  Problem Relation Age of Onset  . Hypertension Mother   . Stroke Mother   . Liver disease Mother     Abcess  . Hypertension Father   . Coronary artery disease Father   . Migraines Father   .  Clotting disorder Father   . Kidney failure Brother   . Hypertension Brother   . Migraines Brother   . Migraines Daughter   . Breast cancer Other     Niece with breast cancer  . Colon cancer Paternal Grandmother   . Pancreatic cancer Paternal Grandmother   . Stomach cancer Paternal Grandmother   . Breast cancer Cousin   . Esophageal cancer Neg Hx   . Rectal cancer Neg Hx       Prior to Admission medications   Medication Sig Start Date End Date Taking? Authorizing Provider  acetaminophen (TYLENOL) 160 MG/5ML liquid Take 960 mg by mouth every 4 (four) hours as needed for fever.   Yes Historical Provider, MD  amLODipine (NORVASC) 5 MG tablet Take 5 mg by mouth daily.  04/22/16  Yes Historical Provider, MD  aspirin 81 MG tablet Take 81 mg by mouth daily.   Yes Historical Provider, MD  cholestyramine (QUESTRAN) 4 g packet Take 4 g by mouth 2 (two) times daily.   Yes Historical Provider, MD  citalopram (CELEXA) 20 MG tablet TAKE 1 TABLET BY MOUTH EVERY DAY 01/20/16  Yes Hoyt Koch, MD  CRANBERRY-VITAMIN C PO Take 4,200 mg by mouth every morning.    Yes Historical Provider, MD  eletriptan (RELPAX) 40 MG tablet Take 1 tablet (40 mg total) by mouth every 2 (two) hours as needed for migraine. 10/26/15  Yes Dennie Bible, NP  loperamide (IMODIUM) 2 MG capsule Take 4 mg by mouth as needed for diarrhea or loose stools.   Yes Historical Provider, MD  loratadine (CLARITIN) 10 MG tablet Take 10 mg by mouth 2 (two) times daily as needed for allergies.    Yes Historical Provider, MD  Lutein-Zeaxanthin 25-5 MG CAPS Take 1 capsule by mouth every morning.    Yes Historical Provider, MD  Meclizine HCl (BONINE PO) Take 1 tablet by mouth 3 times/day as needed-between meals & bedtime.   Yes Historical Provider, MD  Methylcobalamin (METHYL B-12 PO) Take 2,500 mcg by mouth every morning.    Yes Historical Provider, MD  metoprolol succinate (TOPROL-XL) 25 MG 24 hr tablet Take 25 mg by mouth daily.   11/30/15  Yes Historical Provider, MD  Multiple Vitamin (MULTIVITAMIN WITH MINERALS) TABS Take 1 tablet by mouth daily.    Yes Historical Provider, MD  nitrofurantoin (MACRODANTIN) 100 MG capsule Take 100 mg by mouth daily.  07/23/15  Yes Historical Provider, MD  Omega-3 Fatty Acids (FISH OIL PO) Take 1,200 mg by mouth every morning.    Yes Historical Provider, MD  ondansetron (ZOFRAN) 8 MG tablet Take 8 mg by mouth every 6 (six) hours.   Yes Historical Provider,  MD  oxybutynin (DITROPAN-XL) 10 MG 24 hr tablet Take 10 mg by mouth every morning.  05/15/13  Yes Historical Provider, MD  pantoprazole (PROTONIX) 40 MG tablet TAKE ONE (1) TABLET BY MOUTH TWO (2) TIMES DAILY 02/24/16  Yes Ladene Artist, MD  pregabalin (LYRICA) 200 MG capsule TAKE ONE (1) CAPSULE BY MOUTH EVERY MORNING 10/26/15  Yes Dennie Bible, NP  ranitidine (ZANTAC) 300 MG tablet TAKE ONE TABLET BY MOUTH EVERY EVENING 01/20/16  Yes Ladene Artist, MD  simvastatin (ZOCOR) 20 MG tablet TAKE ONE (1) TABLET BY MOUTH EVERY DAY 02/29/16  Yes Belva Crome, MD  spironolactone (ALDACTONE) 25 MG tablet TAKE ONE (1) TABLET BY MOUTH EVERY DAY 02/29/16  Yes Belva Crome, MD  nitroGLYCERIN (NITROSTAT) 0.4 MG SL tablet Place 1 tablet (0.4 mg total) under the tongue every 5 (five) minutes as needed for chest pain. 06/28/15   Belva Crome, MD    Physical Exam: Vitals:   05/06/16 1530 05/06/16 1818 05/06/16 2028 05/06/16 2228  BP:  95/61 126/66 90/64  Pulse:  (!) 58 94 64  Resp:  22 18 16   Temp:      TempSrc:      SpO2:  100% 100% 96%  Weight: 135.6 kg (299 lb)     Height: 5\' 5"  (1.651 m)         Constitutional: NAD, calm, comfortable Eyes: PERRL, lids and conjunctivae normal ENMT: Mucous membranes are moist. Posterior pharynx clear of any exudate or lesions.Normal dentition.  Neck: normal, supple, no masses, no thyromegaly Respiratory: Tachypnea but clear Cardiovascular: Regular rate and rhythm, no murmurs / rubs / gallops. No  extremity edema. 2+ pedal pulses. No carotid bruits.  Abdomen: no tenderness, no masses palpated. No hepatosplenomegaly. Bowel sounds positive.  Musculoskeletal: no clubbing / cyanosis. No joint deformity upper and lower extremities. Good ROM, no contractures. Normal muscle tone.  Skin: no rashes, lesions, ulcers. No induration Neurologic: CN 2-12 grossly intact. Sensation intact, DTR normal. Strength 5/5 in all 4.  Psychiatric: Normal judgment and insight. Alert and oriented x 3. Normal mood.    Labs on Admission: I have personally reviewed following labs and imaging studies  CBC:  Recent Labs Lab 05/06/16 1708  WBC 3.7*  NEUTROABS 2.3  HGB 15.2*  HCT 44.3  MCV 86.5  PLT 0000000*   Basic Metabolic Panel:  Recent Labs Lab 05/06/16 1708  NA 140  K 3.7  CL 107  CO2 25  GLUCOSE 120*  BUN 13  CREATININE 1.12*  CALCIUM 8.8*   GFR: Estimated Creatinine Clearance: 76.4 mL/min (by C-G formula based on SCr of 1.12 mg/dL (H)). Liver Function Tests:  Recent Labs Lab 05/06/16 1708  AST 42*  ALT 42  ALKPHOS 57  BILITOT 0.7  PROT 6.7  ALBUMIN 3.6    Recent Labs Lab 05/06/16 1708  LIPASE 19   No results for input(s): AMMONIA in the last 168 hours. Coagulation Profile: No results for input(s): INR, PROTIME in the last 168 hours. Cardiac Enzymes: No results for input(s): CKTOTAL, CKMB, CKMBINDEX, TROPONINI in the last 168 hours. BNP (last 3 results) No results for input(s): PROBNP in the last 8760 hours. HbA1C: No results for input(s): HGBA1C in the last 72 hours. CBG: No results for input(s): GLUCAP in the last 168 hours. Lipid Profile: No results for input(s): CHOL, HDL, LDLCALC, TRIG, CHOLHDL, LDLDIRECT in the last 72 hours. Thyroid Function Tests: No results for input(s): TSH, T4TOTAL, FREET4, T3FREE, THYROIDAB in the  last 72 hours. Anemia Panel: No results for input(s): VITAMINB12, FOLATE, FERRITIN, TIBC, IRON, RETICCTPCT in the last 72 hours. Urine  analysis:    Component Value Date/Time   COLORURINE YELLOW 05/06/2016 1636   APPEARANCEUR CLEAR 05/06/2016 1636   LABSPEC 1.009 05/06/2016 1636   PHURINE 8.0 05/06/2016 1636   GLUCOSEU NEGATIVE 05/06/2016 1636   HGBUR MODERATE (A) 05/06/2016 1636   BILIRUBINUR NEGATIVE 05/06/2016 1636   KETONESUR 20 (A) 05/06/2016 1636   PROTEINUR NEGATIVE 05/06/2016 1636   UROBILINOGEN 1.0 09/30/2014 2358   NITRITE NEGATIVE 05/06/2016 1636   LEUKOCYTESUR NEGATIVE 05/06/2016 1636   Sepsis Labs: @LABRCNTIP (procalcitonin:4,lacticidven:4) )No results found for this or any previous visit (from the past 240 hour(s)).   Radiological Exams on Admission: No results found.  EKG: Independently reviewed.  Assessment/Plan Principal Problem:   Nausea vomiting and diarrhea Active Problems:   Benign essential HTN    1. N/V/D - probably viral gastroenteritis 1. IVF 2. Repeat CBC and BMP in AM 3. zofran PRN nausea 4. Loperamide 5. Advance diet as tolerated 6. Flu PNL pending but no respiratory symptoms really other than tachypnea 2. HTN - holding BP meds for borderline low BPs in ED   DVT prophylaxis: Lovenox Code Status: Full Family Communication: Husband at bedside Consults called: None Admission status: Place in [place], [place] [facility] Pager [phone] from 7PM-7AM  If 7AM-7PM, please contact the day physician for the patient [email or web address] Password [email or web address]  05/06/2016, 11:19 PM

## 2016-05-07 DIAGNOSIS — R197 Diarrhea, unspecified: Secondary | ICD-10-CM

## 2016-05-07 DIAGNOSIS — I10 Essential (primary) hypertension: Secondary | ICD-10-CM

## 2016-05-07 DIAGNOSIS — R112 Nausea with vomiting, unspecified: Secondary | ICD-10-CM

## 2016-05-07 DIAGNOSIS — A084 Viral intestinal infection, unspecified: Secondary | ICD-10-CM

## 2016-05-07 DIAGNOSIS — K219 Gastro-esophageal reflux disease without esophagitis: Secondary | ICD-10-CM | POA: Diagnosis not present

## 2016-05-07 DIAGNOSIS — N289 Disorder of kidney and ureter, unspecified: Secondary | ICD-10-CM

## 2016-05-07 LAB — BASIC METABOLIC PANEL
ANION GAP: 5 (ref 5–15)
BUN: 12 mg/dL (ref 6–20)
CO2: 26 mmol/L (ref 22–32)
Calcium: 8.3 mg/dL — ABNORMAL LOW (ref 8.9–10.3)
Chloride: 114 mmol/L — ABNORMAL HIGH (ref 101–111)
Creatinine, Ser: 0.99 mg/dL (ref 0.44–1.00)
Glucose, Bld: 113 mg/dL — ABNORMAL HIGH (ref 65–99)
POTASSIUM: 4.2 mmol/L (ref 3.5–5.1)
SODIUM: 145 mmol/L (ref 135–145)

## 2016-05-07 LAB — CBC
HEMATOCRIT: 39.8 % (ref 36.0–46.0)
Hemoglobin: 13.4 g/dL (ref 12.0–15.0)
MCH: 29.4 pg (ref 26.0–34.0)
MCHC: 33.7 g/dL (ref 30.0–36.0)
MCV: 87.3 fL (ref 78.0–100.0)
Platelets: 133 10*3/uL — ABNORMAL LOW (ref 150–400)
RBC: 4.56 MIL/uL (ref 3.87–5.11)
RDW: 13.2 % (ref 11.5–15.5)
WBC: 4.5 10*3/uL (ref 4.0–10.5)

## 2016-05-07 LAB — INFLUENZA PANEL BY PCR (TYPE A & B)
INFLBPCR: NEGATIVE
Influenza A By PCR: NEGATIVE

## 2016-05-07 MED ORDER — ENOXAPARIN SODIUM 60 MG/0.6ML ~~LOC~~ SOLN
60.0000 mg | Freq: Every day | SUBCUTANEOUS | Status: DC
  Administered 2016-05-07: 02:00:00 60 mg via SUBCUTANEOUS
  Filled 2016-05-07: qty 0.6

## 2016-05-07 MED ORDER — SODIUM CHLORIDE 0.9 % IV BOLUS (SEPSIS)
500.0000 mL | Freq: Once | INTRAVENOUS | Status: AC
  Administered 2016-05-07: 500 mL via INTRAVENOUS

## 2016-05-07 MED ORDER — ACETAMINOPHEN 325 MG PO TABS: 650.0000 mg | ORAL_TABLET | Freq: Four times a day (QID) | ORAL | Status: DC | PRN

## 2016-05-07 NOTE — Progress Notes (Signed)
Rx Brief note:  Lovenox   Wt=135 kg, BMI=49, CrCl~76 ml/min Rx adjusted Lovenox to 60 mg daily (~0.5 mg/kg) with BMI>30  Thanks Dorrene German 05/07/2016 1:04 AM

## 2016-05-07 NOTE — Discharge Summary (Signed)
Physician Discharge Summary  Heather Frederick [id number] DOB: February 11, 1956 DOA: 05/06/2016  PCP: Hoyt Koch, MD  Admit date: 05/06/2016 Discharge date: 05/07/2016  Time spent: 45 minutes  Recommendations for Outpatient Follow-up:  Patient will be discharged to home.  Patient will need to follow up with primary care provider within one week of discharge.  Patient should continue medications as prescribed.  Patient should follow a full liquid diet, advance as tolerated.   Discharge Diagnoses:  Nausea, vomiting, diarrhea likely secondary to viral gastroenteritis Essential hypertension Mild renal insufficiency Depression GERD Hyperlipidemia H/o Frequent UTI  Discharge Condition: Stable  Diet recommendation: full liquid, advance as tolerated  Filed Weights   05/06/16 1530  Weight: 135.6 kg (299 lb)    History of present illness:  On 05/06/2016 by Dr. Jennette Kettle Heather Frederick is a 61 y.o. female with medical history significant of HTN.  Patient presents to the ED with c/o N/V/D, generalized weakness, body aches.  Symptoms onset 2 days ago, worsening.  Loperamide didn't help much.  Fever, chills, headache as of today.  Unable to take meds for past 2 days due to N/V.  Symptoms are now severe.  Does have recent possible sick contacts (similar symptoms going through grandchildren's house).  Hospital Course:  Nausea, vomiting, diarrhea likely secondary to viral gastroenteritis -Patient had several family members with similar complaints. -No further diarrhea in the past 24 hours -Nausea and vomiting have improved -Placed clear liquids and diet was advanced. Patient was able to tolerate.  -Was given supportive care, IV fluids, antiemetics as needed -Influenza PCR negative -Encouraged patient to stay hydrated.   Essential hypertension -BP was soft on admission, 90/40. Currently stable. -Medications currently held will start back if needed -May restart home meds on  discharge  Mild renal insufficiency -secondary to poor oral intake, GI losses -Baseline creatinine approximately 0.9, upon admission 1.12 -Currently creatinine 0.99 -Continue to monitor BMP  Depression -Continue Celexa  GERD -Continue PPI  Hyperlipidemia -Continue statin  H/o Frequent UTI -Continue nitrofurantoin  Consultants None  Procedures  None Discharge Exam: Vitals:   05/07/16 0500 05/07/16 0632  BP: (!) 98/34 121/62  Pulse: 64   Resp: 17   Temp: 98.1 F (36.7 C)    Patient states she feels better and has been able to tolerate liquids.  Would like to go home. Denies further nausea, vomiting, diarrhea.   Exam  General: Well developed, well nourished, NAD, appears stated age  38: NCAT, mucous membranes moist  Cardiovascular: S1 S2 auscultated, RRR, no murmurs  Respiratory: Clear to auscultation bilaterally with equal chest rise  Abdomen: Soft, obese, nontender, nondistended, + bowel sounds  Extremities: warm dry without cyanosis clubbing or edema  Neuro: AAOx3, nonfocal  Psych: Normal affect and demeanor with intact judgement and insight  Discharge Instructions Discharge Instructions    Discharge instructions    Complete by:  As directed    Patient will be discharged to home.  Patient will need to follow up with primary care provider within one week of discharge.  Patient should continue medications as prescribed.  Patient should follow a full liquid diet, advance as tolerated.     Current Discharge Medication List    CONTINUE these medications which have NOT CHANGED   Details  acetaminophen (TYLENOL) 160 MG/5ML liquid Take 960 mg by mouth every 4 (four) hours as needed for fever.    amLODipine (NORVASC) 5 MG tablet Take 5 mg by mouth daily.     aspirin 81  MG tablet Take 81 mg by mouth daily.    cholestyramine (QUESTRAN) 4 g packet Take 4 g by mouth 2 (two) times daily.    citalopram (CELEXA) 20 MG tablet TAKE 1 TABLET BY MOUTH  EVERY DAY Qty: 30 tablet, Refills: 3    CRANBERRY-VITAMIN C PO Take 4,200 mg by mouth every morning.     eletriptan (RELPAX) 40 MG tablet Take 1 tablet (40 mg total) by mouth every 2 (two) hours as needed for migraine. Qty: 8 tablet, Refills: 8    loperamide (IMODIUM) 2 MG capsule Take 4 mg by mouth as needed for diarrhea or loose stools.    loratadine (CLARITIN) 10 MG tablet Take 10 mg by mouth 2 (two) times daily as needed for allergies.     Lutein-Zeaxanthin 25-5 MG CAPS Take 1 capsule by mouth every morning.     Meclizine HCl (BONINE PO) Take 1 tablet by mouth 3 times/day as needed-between meals & bedtime.    Methylcobalamin (METHYL B-12 PO) Take 2,500 mcg by mouth every morning.     metoprolol succinate (TOPROL-XL) 25 MG 24 hr tablet Take 25 mg by mouth daily.     Multiple Vitamin (MULTIVITAMIN WITH MINERALS) TABS Take 1 tablet by mouth daily.     nitrofurantoin (MACRODANTIN) 100 MG capsule Take 100 mg by mouth daily.     Omega-3 Fatty Acids (FISH OIL PO) Take 1,200 mg by mouth every morning.     ondansetron (ZOFRAN) 8 MG tablet Take 8 mg by mouth every 6 (six) hours.    oxybutynin (DITROPAN-XL) 10 MG 24 hr tablet Take 10 mg by mouth every morning.     pantoprazole (PROTONIX) 40 MG tablet TAKE ONE (1) TABLET BY MOUTH TWO (2) TIMES DAILY Qty: 60 tablet, Refills: 5    pregabalin (LYRICA) 200 MG capsule TAKE ONE (1) CAPSULE BY MOUTH EVERY MORNING Qty: 30 capsule, Refills: 5    ranitidine (ZANTAC) 300 MG tablet TAKE ONE TABLET BY MOUTH EVERY EVENING Qty: 30 tablet, Refills: 11    simvastatin (ZOCOR) 20 MG tablet TAKE ONE (1) TABLET BY MOUTH EVERY DAY Qty: 90 tablet, Refills: 3    spironolactone (ALDACTONE) 25 MG tablet TAKE ONE (1) TABLET BY MOUTH EVERY DAY Qty: 90 tablet, Refills: 3    nitroGLYCERIN (NITROSTAT) 0.4 MG SL tablet Place 1 tablet (0.4 mg total) under the tongue every 5 (five) minutes as needed for chest pain. Qty: 25 tablet, Refills: 5      STOP taking  these medications     hyoscyamine (LEVSIN SL) 0.125 MG SL tablet        Allergies  Allergen Reactions  . Topamax [Topiramate] Other (See Comments)    :Stroke like symptoms  . Aleve [Naproxen Sodium] Hives    Has tolerated Voltaren topical as well as aspirin.  . Bee Venom Swelling  . Echinacea Hives  . Other Other (See Comments)    Feathers cause sinus congestion  . Sulfa Antibiotics Hives  . Advil [Ibuprofen] Hives    Has tolerated Voltaren topical as well as aspirin.   Follow-up Information    Hoyt Koch, MD. Schedule an appointment as soon as possible for a visit in 1 week(s).   Specialty:  Internal Medicine Why:  Hospital follow up Contact information: [place] [address] [phone]            The results of significant diagnostics from this hospitalization (including imaging, microbiology, ancillary and laboratory) are listed below for reference.  Significant Diagnostic Studies: Xr Pelvis 1-2 Views  Result Date: 05/01/2016 An AP of the pelvis and lateral of the right hip are obtained. There is some slight decrease in the joint space and minimal peripheral spurring off the femoral head. Both consistent with mild osteoarthritis right hip   Microbiology: No results found for this or any previous visit (from the past 240 hour(s)).   Labs: Basic Metabolic Panel:  Recent Labs Lab 05/06/16 1708 05/07/16 0535  NA 140 145  K 3.7 4.2  CL 107 114*  CO2 25 26  GLUCOSE 120* 113*  BUN 13 12  CREATININE 1.12* 0.99  CALCIUM 8.8* 8.3*   Liver Function Tests:  Recent Labs Lab 05/06/16 1708  AST 42*  ALT 42  ALKPHOS 57  BILITOT 0.7  PROT 6.7  ALBUMIN 3.6    Recent Labs Lab 05/06/16 1708  LIPASE 19   No results for input(s): AMMONIA in the last 168 hours. CBC:  Recent Labs Lab 05/06/16 1708 05/07/16 0535  WBC 3.7* 4.5  NEUTROABS 2.3  --   HGB 15.2* 13.4  HCT 44.3 39.8  MCV 86.5 87.3  PLT 138* 133*    Cardiac Enzymes: No results for input(s): CKTOTAL, CKMB, CKMBINDEX, TROPONINI in the last 168 hours. BNP: BNP (last 3 results) No results for input(s): BNP in the last 8760 hours.  ProBNP (last 3 results) No results for input(s): PROBNP in the last 8760 hours.  CBG: No results for input(s): GLUCAP in the last 168 hours.     SignedCristal Ford  [facility] 05/07/2016, 4:07 PM

## 2016-05-07 NOTE — ED Notes (Signed)
Attempted to call report.  Will call back in a few minutes.

## 2016-05-07 NOTE — Progress Notes (Signed)
PROGRESS NOTE    Heather Frederick  [id number] DOB: Nov 24, 1955 DOA: 05/06/2016 PCP: Hoyt Koch, MD   Chief Complaint  Patient presents with  . Flu-like symptoms     Brief Narrative:   Assessment & Plan   Nausea, vomiting, diarrhea likely secondary to viral gastroenteritis -Patient had several family members with similar complaints. -No further diarrhea in the past 24 hours -Nausea and vomiting have improved -Placed on clear liquid diet and tolerated well. We'll advance as tolerated -Continue supportive care, IV fluids, antiemetics as needed -Influenza PCR negative  Essential hypertension -BP was soft on admission, 90/40 -Medications currently held will start back if needed  Mild renal insufficiency -secondary to poor oral intake, GI losses -Baseline creatinine approximately 0.9, upon admission 1.12 -Currently creatinine 0.99 -Continue to monitor BMP  Depression -Continue Celexa  GERD -Continue PPI  Hyperlipidemia -Continue statin  H/o Frequent UTI -Continue nitrofurantoin  DVT Prophylaxis  lovenox  Code Status:Full  Family Communication: Husband at bedside  Disposition Plan: Observation. Likely discharge 05/08/2016  Consultants None  Procedures  None  Antibiotics   Anti-infectives    None      Subjective:   Heather Frederick seen and examined today. Patient feels nausea has improved. Able to tolerate clear liquids this morning without nausea, vomiting, abdminal pain, or diarrhea.  Currently denies chest pain, shortness of breath, dizziness or headache. Does feel weak.  Objective:   Vitals:   05/07/16 0326 05/07/16 0458 05/07/16 0500 05/07/16 0632  BP: (!) 113/50 (!) 90/40 (!) 98/34 121/62  Pulse:   64   Resp:   17   Temp:   98.1 F (36.7 C)   TempSrc:   Oral   SpO2:   96%   Weight:      Height:        Intake/Output Summary (Last 24 hours) at 05/07/16 1039 Last data filed at 05/07/16 [id number]  Gross per 24 hour  Intake           3358.33 ml  Output                0 ml  Net          3358.33 ml   Filed Weights   05/06/16 1530  Weight: 135.6 kg (299 lb)    Exam  General: Well developed, well nourished, NAD, appears stated age  HEENT: NCAT, mucous membranes dry   Cardiovascular: S1 S2 auscultated, no rubs, murmurs or gallops. Regular rate and rhythm.  Respiratory: Clear to auscultation bilaterally with equal chest rise  Abdomen: Soft, obese, nontender, nondistended, + bowel sounds  Extremities: warm dry without cyanosis clubbing or edema  Neuro: AAOx3, nonfocal  Psych: Normal affect and demeanor with intact judgement and insight   Data Reviewed: I have personally reviewed following labs and imaging studies  CBC:  Recent Labs Lab 05/06/16 1708 05/07/16 0535  WBC 3.7* 4.5  NEUTROABS 2.3  --   HGB 15.2* 13.4  HCT 44.3 39.8  MCV 86.5 87.3  PLT 138* [id number]*   Basic Metabolic Panel:  Recent Labs Lab 05/06/16 1708 05/07/16 0535  NA 140 145  K 3.7 4.2  CL 107 114*  CO2 25 26  GLUCOSE 120* 113*  BUN 13 12  CREATININE 1.12* 0.99  CALCIUM 8.8* 8.3*   GFR: Estimated Creatinine Clearance: 86.4 mL/min (by C-G formula based on SCr of 0.99 mg/dL). Liver Function Tests:  Recent Labs Lab 05/06/16 1708  AST 42*  ALT 42  ALKPHOS 57  BILITOT  0.7  PROT 6.7  ALBUMIN 3.6    Recent Labs Lab 05/06/16 1708  LIPASE 19   No results for input(s): AMMONIA in the last 168 hours. Coagulation Profile: No results for input(s): INR, PROTIME in the last 168 hours. Cardiac Enzymes: No results for input(s): CKTOTAL, CKMB, CKMBINDEX, TROPONINI in the last 168 hours. BNP (last 3 results) No results for input(s): PROBNP in the last 8760 hours. HbA1C: No results for input(s): HGBA1C in the last 72 hours. CBG: No results for input(s): GLUCAP in the last 168 hours. Lipid Profile: No results for input(s): CHOL, HDL, LDLCALC, TRIG, CHOLHDL, LDLDIRECT in the last 72 hours. Thyroid Function Tests: No  results for input(s): TSH, T4TOTAL, FREET4, T3FREE, THYROIDAB in the last 72 hours. Anemia Panel: No results for input(s): VITAMINB12, FOLATE, FERRITIN, TIBC, IRON, RETICCTPCT in the last 72 hours. Urine analysis:    Component Value Date/Time   COLORURINE YELLOW 05/06/2016 1636   APPEARANCEUR CLEAR 05/06/2016 1636   LABSPEC 1.009 05/06/2016 1636   PHURINE 8.0 05/06/2016 1636   GLUCOSEU NEGATIVE 05/06/2016 1636   HGBUR MODERATE (A) 05/06/2016 1636   BILIRUBINUR NEGATIVE 05/06/2016 1636   KETONESUR 20 (A) 05/06/2016 1636   PROTEINUR NEGATIVE 05/06/2016 1636   UROBILINOGEN 1.0 09/30/2014 2358   NITRITE NEGATIVE 05/06/2016 1636   LEUKOCYTESUR NEGATIVE 05/06/2016 1636   Sepsis Labs: @LABRCNTIP (procalcitonin:4,lacticidven:4)  )No results found for this or any previous visit (from the past 240 hour(s)).    Radiology Studies: No results found.   Scheduled Meds: . aspirin  81 mg Oral Daily  . cholestyramine  4 g Oral BID  . citalopram  20 mg Oral Daily  . enoxaparin (LOVENOX) injection  60 mg Subcutaneous QHS  . famotidine  40 mg Oral QHS  . nitrofurantoin  100 mg Oral Daily  . oxybutynin  10 mg Oral q morning - 10a  . pantoprazole  40 mg Oral BID  . pregabalin  200 mg Oral Daily  . simvastatin  20 mg Oral q1800   Continuous Infusions: . sodium chloride 125 mL/hr at 05/07/16 0804     LOS: 0 days   Time Spent in minutes   30 minutes  Kyrus Hyde D.O. on 05/07/2016 at 10:39 AM  Between 7am to 7pm - Pager - [phone]  After 7pm go to [email or web address] - password [email or web address]  And look for the night coverage person covering for me after hours  [facility] Office  [phone]

## 2016-05-07 NOTE — Discharge Instructions (Signed)

## 2016-05-07 NOTE — Progress Notes (Signed)
Pt complains of nausea and vomiting and prefers not to wear cpap tonight.  Pt was advised that RT is available all night.  This RT encouraged her to call when her condition improves, and she wants to try cpap.

## 2016-05-07 NOTE — Progress Notes (Signed)
Patient discharged home.  Leaving with personal belongings.  Reports understanding of discharge instructions.  Room air, denies pain, no s/s of distress.  Accompanied by husband.  No complaints. 

## 2016-05-07 NOTE — Progress Notes (Signed)
Paged the on call bp 84/40 on admission to the unit.

## 2016-05-07 NOTE — Progress Notes (Signed)
PHARMACIST - PHYSICIAN ORDER COMMUNICATION  CONCERNING: P&T Medication Policy on Herbal Medications  DESCRIPTION:  This patient's order for: Lutein-Zeaxanthin   has been noted.  This product(s) is classified as an "herbal" or natural product. Due to a lack of definitive safety studies or FDA approval, nonstandard manufacturing practices, plus the potential risk of unknown drug-drug interactions while on inpatient medications, the Pharmacy and Therapeutics Committee does not permit the use of "herbal" or natural products of this type within [facility].   ACTION TAKEN: The pharmacy department is unable to verify this order at this time and your patient has been informed of this safety policy. Please reevaluate patient's clinical condition at discharge and address if the herbal or natural product(s) should be resumed at that time.   Thanks Dorrene German 05/07/2016 1:03 AM

## 2016-05-11 ENCOUNTER — Ambulatory Visit (INDEPENDENT_AMBULATORY_CARE_PROVIDER_SITE_OTHER): Payer: 59 | Admitting: Internal Medicine

## 2016-05-11 ENCOUNTER — Encounter: Payer: Self-pay | Admitting: Internal Medicine

## 2016-05-11 DIAGNOSIS — R112 Nausea with vomiting, unspecified: Secondary | ICD-10-CM | POA: Diagnosis not present

## 2016-05-11 DIAGNOSIS — R197 Diarrhea, unspecified: Secondary | ICD-10-CM

## 2016-05-11 DIAGNOSIS — K429 Umbilical hernia without obstruction or gangrene: Secondary | ICD-10-CM

## 2016-05-11 DIAGNOSIS — R1011 Right upper quadrant pain: Secondary | ICD-10-CM | POA: Diagnosis not present

## 2016-05-11 MED ORDER — ONDANSETRON HCL 8 MG PO TABS: 8.0000 mg | ORAL_TABLET | Freq: Four times a day (QID) | ORAL | 0 refills | Status: DC

## 2016-05-11 NOTE — Patient Instructions (Signed)
We have sent in the zofran to use for nausea which is safe to take with the other you have as well.  Take the pericolace today 2 pills twice a day until you start moving your bowels. If nothing happens by Friday late or Saturday add some miralax to work more quickly.    Food Choices to Help Relieve Diarrhea, Adult When you have diarrhea, the foods you eat and your eating habits are very important. Choosing the right foods and drinks can help relieve diarrhea. Also, because diarrhea can last up to 7 days, you need to replace lost fluids and electrolytes (such as sodium, potassium, and chloride) in order to help prevent dehydration. What general guidelines do I need to follow?  Slowly drink 1 cup (8 oz) of fluid for each episode of diarrhea. If you are getting enough fluid, your urine will be clear or pale yellow.  Eat starchy foods. Some good choices include white rice, white toast, pasta, low-fiber cereal, baked potatoes (without the skin), saltine crackers, and bagels.  Avoid large servings of any cooked vegetables.  Limit fruit to two servings per day. A serving is  cup or 1 small piece.  Choose foods with less than 2 g of fiber per serving.  Limit fats to less than 8 tsp (38 g) per day.  Avoid fried foods.  Eat foods that have probiotics in them. Probiotics can be found in certain dairy products.  Avoid foods and beverages that may increase the speed at which food moves through the stomach and intestines (gastrointestinal tract). Things to avoid include:  High-fiber foods, such as dried fruit, raw fruits and vegetables, nuts, seeds, and whole grain foods.  Spicy foods and high-fat foods.  Foods and beverages sweetened with high-fructose corn syrup, honey, or sugar alcohols such as xylitol, sorbitol, and mannitol. What foods are recommended? Grains  White rice. White, [place], or pita breads (fresh or toasted), including plain rolls, buns, or bagels. White pasta. Saltine, soda, or  graham crackers. Pretzels. Low-fiber cereal. Cooked cereals made with water (such as cornmeal, farina, or cream cereals). Plain muffins. Matzo. Melba toast. Zwieback. Vegetables  Potatoes (without the skin). Strained tomato and vegetable juices. Most well-cooked and canned vegetables without seeds. Tender lettuce. Fruits  Cooked or canned applesauce, apricots, cherries, fruit cocktail, grapefruit, peaches, pears, or plums. Fresh bananas, apples without skin, cherries, grapes, cantaloupe, grapefruit, peaches, oranges, or plums. Meat and Other Protein Products  Baked or boiled chicken. Eggs. Tofu. Fish. Seafood. Smooth peanut butter. Ground or well-cooked tender beef, ham, veal, lamb, pork, or poultry. Dairy  Plain yogurt, kefir, and unsweetened liquid yogurt. Lactose-free milk, buttermilk, or soy milk. Plain hard cheese. Beverages  Sport drinks. Clear broths. Diluted fruit juices (except prune). Regular, caffeine-free sodas such as ginger ale. Water. Decaffeinated teas. Oral rehydration solutions. Sugar-free beverages not sweetened with sugar alcohols. Other  Bouillon, broth, or soups made from recommended foods. The items listed above may not be a complete list of recommended foods or beverages. Contact your dietitian for more options.  What foods are not recommended? Grains  Whole grain, whole wheat, bran, or rye breads, rolls, pastas, crackers, and cereals. Wild or brown rice. Cereals that contain more than 2 g of fiber per serving. Corn tortillas or taco shells. Cooked or dry oatmeal. Granola. Popcorn. Vegetables  Raw vegetables. Cabbage, broccoli, Brussels sprouts, artichokes, baked beans, beet greens, corn, kale, legumes, peas, sweet potatoes, and yams. Potato skins. Cooked spinach and cabbage. Fruits  Dried fruit, including raisins  and dates. Raw fruits. Stewed or dried prunes. Fresh apples with skin, apricots, mangoes, pears, raspberries, and strawberries. Meat and Other Protein Products    Chunky peanut butter. Nuts and seeds. Beans and lentils. Berniece Salines. Dairy  High-fat cheeses. Milk, chocolate milk, and beverages made with milk, such as milk shakes. Cream. Ice cream. Sweets and Desserts  Sweet rolls, doughnuts, and sweet breads. Pancakes and waffles. Fats and Oils  Butter. Cream sauces. Margarine. Salad oils. Plain salad dressings. Olives. Avocados. Beverages  Caffeinated beverages (such as coffee, tea, soda, or energy drinks). Alcoholic beverages. Fruit juices with pulp. Prune juice. Soft drinks sweetened with high-fructose corn syrup or sugar alcohols. Other  Coconut. Hot sauce. Chili powder. Mayonnaise. Gravy. Cream-based or milk-based soups. The items listed above may not be a complete list of foods and beverages to avoid. Contact your dietitian for more information.  What should I do if I become dehydrated? Diarrhea can sometimes lead to dehydration. Signs of dehydration include dark urine and dry mouth and skin. If you think you are dehydrated, you should rehydrate with an oral rehydration solution. These solutions can be purchased at pharmacies, retail stores, or online. Drink -1 cup (120-240 mL) of oral rehydration solution each time you have an episode of diarrhea. If drinking this amount makes your diarrhea worse, try drinking smaller amounts more often. For example, drink 1-3 tsp (5-15 mL) every 5-10 minutes. A general rule for staying hydrated is to drink 1-2 L of fluid per day. Talk to your health care provider about the specific amount you should be drinking each day. Drink enough fluids to keep your urine clear or pale yellow. This information is not intended to replace advice given to you by your health care provider. Make sure you discuss any questions you have with your health care provider. Document Released: 06/03/2003 Document Revised: 08/19/2015 Document Reviewed: 02/03/2013 Elsevier Interactive Patient Education  2017 [employer].

## 2016-05-11 NOTE — Assessment & Plan Note (Signed)
Much improved but some soreness from the distention and likely some muscle strain from vomiting with her viral gastroenteritis.

## 2016-05-11 NOTE — Assessment & Plan Note (Addendum)
Mostly resolved but excessive imodium has caused iatrogenic constipation. She is advised to start senokot-d 2 BID until BM and if none in 48 hours add miralax to help. Also to try fruits and prune juice at home. Rx for zofran for the nausea and encouraged to increase fluid intake as well. BP is normal and she is not sure if she is taking her BP meds now or not.

## 2016-05-11 NOTE — Progress Notes (Signed)
   Subjective:    Patient ID: Heather Frederick, female    DOB: 1955/08/21, 61 y.o.   MRN: [id number]  HPI The patient is a 61 YO female coming in for hospital follow up (in for viral gastroenteritis with NVD), She is now recovered somewhat from this. No more diarrhea. Still appetite not back to normal. She is a little constipated since leaving the hospital but did take a good amount of imodium while having the diarrhea to help stop that. She is having some bloating and pressure in her stomach. Passing gas and small bits of stool in the last 4 days but no normal bm. Eating okay and drinking fine since being home. No new complaints except some back pain from being on the gurney in the ER for 8 hours.   PMH, [facility], social history reviewed and updated.   Review of Systems  Constitutional: Positive for activity change and appetite change. Negative for chills, fatigue, fever and unexpected weight change.  HENT: Negative.   Respiratory: Negative.   Cardiovascular: Negative.   Gastrointestinal: Positive for abdominal distention, abdominal pain, constipation and nausea. Negative for blood in stool, diarrhea, rectal pain and vomiting.  Musculoskeletal: Positive for back pain. Negative for arthralgias, gait problem and joint swelling.  Skin: Negative.   Neurological: Negative.   Psychiatric/Behavioral: Negative.       Objective:   Physical Exam  Constitutional: She is oriented to person, place, and time. She appears well-developed and well-nourished.  Overweight  HENT:  Head: Normocephalic and atraumatic.  Eyes: EOM are normal.  Neck: Normal range of motion.  Cardiovascular: Normal rate and regular rhythm.   Pulmonary/Chest: Effort normal and breath sounds normal. No respiratory distress. She has no wheezes. She has no rales.  Abdominal: Soft. Bowel sounds are normal. She exhibits distension. She exhibits no mass. There is tenderness. There is no rebound and no guarding.  Somewhat distended but BS  normal, no rebound or guarding, mild tenderness in the midline.   Musculoskeletal: She exhibits no edema.  Neurological: She is alert and oriented to person, place, and time. Coordination normal.  Skin: Skin is warm and dry.   Vitals:   05/11/16 0928  BP: 106/70  Pulse: 79  Temp: 98.4 F (36.9 C)  TempSrc: Oral  SpO2: 99%  Weight: 295 lb (133.8 kg)  Height: 5\' 5"  (1.651 m)      Assessment & Plan:

## 2016-05-11 NOTE — Assessment & Plan Note (Signed)
Was supposed to get fixed in about 1 week but delayed a month for healing.

## 2016-05-11 NOTE — Progress Notes (Signed)
Pre visit review using our clinic review tool, if applicable. No additional management support is needed unless otherwise documented below in the visit note. 

## 2016-05-12 ENCOUNTER — Ambulatory Visit (INDEPENDENT_AMBULATORY_CARE_PROVIDER_SITE_OTHER): Payer: Self-pay

## 2016-05-12 ENCOUNTER — Encounter (INDEPENDENT_AMBULATORY_CARE_PROVIDER_SITE_OTHER): Payer: Self-pay | Admitting: Physical Medicine and Rehabilitation

## 2016-05-12 ENCOUNTER — Ambulatory Visit (INDEPENDENT_AMBULATORY_CARE_PROVIDER_SITE_OTHER): Payer: 59 | Admitting: Physical Medicine and Rehabilitation

## 2016-05-12 VITALS — BP 111/59 | HR 75

## 2016-05-12 DIAGNOSIS — M25551 Pain in right hip: Secondary | ICD-10-CM

## 2016-05-12 NOTE — Patient Instructions (Signed)

## 2016-05-12 NOTE — Progress Notes (Signed)
Heather Frederick - 61 y.o. female MRN [id number]  Date of birth: 1955-05-27  Office Visit Note: Visit Date: 05/12/2016 PCP: Hoyt Koch, MD Referred by: Hoyt Koch, *  Subjective: Chief Complaint  Patient presents with  . Right Hip - Pain   HPI: Heather Frederick is a 61 year old female that we saw last year and completed lumbar facet joint blocks diagnostically as well as radiofrequency ablation. At the time of follow-up this seem like it helped her low back some to a degree and now she is having more Right hip pain for approximately 6 months. She reports occasional groin pain. Denies pain radiating down legs. Worse with laying on that side. Dr. Durward Fortes request diagnostic and therapeutic anesthetic hip arthrogram. She is currently on a significant amount of Lyrica.    ROS Otherwise per HPI.  Assessment & Plan: Visit Diagnoses:  1. Pain in right hip     Plan: Findings:  Right hip anesthetic arthrogram. Patient did seem to have some relief during the anesthetic phase.    Meds & Orders: No orders of the defined types were placed in this encounter.   Orders Placed This Encounter  Procedures  . Large Joint Injection/Arthrocentesis  . XR C-ARM NO REPORT    Follow-up: Return if symptoms worsen or fail to improve, 2 weeks, for Dr. Durward Fortes.   Procedures: Hip joint anesthetic arthrogram Date/Time: 05/12/2016 9:04 AM Performed by: Magnus Sinning Authorized by: Magnus Sinning   Consent Given by:  Patient Site marked: the procedure site was marked   Timeout: prior to procedure the correct patient, procedure, and site was verified   Indications:  Pain and diagnostic evaluation Location:  Hip Site:  R hip joint Prep: patient was prepped and draped in usual sterile fashion   Needle Size:  22 G Needle length (in): 5.0 inches. Approach:  Anterior Ultrasound Guidance: No   Fluoroscopic Guidance: No   Arthrogram: Yes   Medications:  80 mg triamcinolone acetonide  40 MG/ML; 3 mL bupivacaine 0.5 % Aspiration Attempted: Yes   Patient tolerance:  Patient tolerated the procedure well with no immediate complications  Arthrogram demonstrated excellent flow of contrast throughout the joint surface without extravasation or obvious defect.  The patient had some relief of symptoms during the anesthetic phase of the injection.      No notes on file   Clinical History: No specialty comments available.  She reports that she has never smoked. She has never used smokeless tobacco.   Recent Labs  07/28/15 0859  HGBA1C 5.9    Objective:  VS:  HT:    WT:   BMI:     BP:(!) 111/59  HR:75bpm  TEMP: ( )  RESP:  Physical Exam  Musculoskeletal:  Slow to rise from a seated position. Patient is obese. She does have some concordant pain with hip rotation. She has no severe pain over the greater trochanter but is tender.    Ortho Exam Imaging: No results found.  Past Medical/Family/Surgical/Social History: Medications & Allergies reviewed per EMR Patient Active Problem List   Diagnosis Date Noted  . Nausea vomiting and diarrhea 05/06/2016  . Umbilical hernia 0000000  . Edema extremities 03/07/2016  . RUQ pain 01/27/2016  . Depression 01/28/2015  . Bile duct stone   . Elevated LFTs   . Benign essential HTN 09/23/2014  . Routine general medical examination at a health care facility 08/01/2014  . OSA (obstructive sleep apnea) 07/04/2014  . Dyslipidemia 03/09/2014  . Diastolic heart  failure, stage B   . GERD (gastroesophageal reflux disease)   . Meralgia paresthetica of right side 10/02/2012  . Osteoarthritis of left knee 09/12/2012  . Migraines 09/12/2012  . Severe obesity (BMI >= 40) ([place]) 09/12/2012   Past Medical History:  Diagnosis Date  . Allergy   . Anxiety   . Arthritis   . Benign essential HTN 09/23/2014  . Chronic kidney disease    uti  . Depression   . Dyslipidemia   . Elevated liver enzymes   . Family history of anesthesia  complication    father has a severe hard time waking up  . Gallstones    a. Seen on CT 01/2014.  Marland Kitchen GERD (gastroesophageal reflux disease)   . Hard of hearing   . Hepatic steatosis   . History of frequent urinary tract infections   . Hyperlipidemia   . Hypertension   . Lateral epicondylitis of right elbow   . Mental disorder   . Meralgia paresthetica of right side 10/02/2012   slight at 05/2014  . Migraine headache   . Obesity   . OSA (obstructive sleep apnea)    severe with AHI 37/hr now on CPAP at 18cm H2O  . Osteoarthritis   . Raynaud disease    in feet per patient   . Sleep apnea    wears c-pap  . Urinary tract infection    Family History  Problem Relation Age of Onset  . Hypertension Mother   . Stroke Mother   . Liver disease Mother     Abcess  . Hypertension Father   . Coronary artery disease Father   . Migraines Father   . Clotting disorder Father   . Kidney failure Brother   . Hypertension Brother   . Migraines Brother   . Migraines Daughter   . Breast cancer Other     Niece with breast cancer  . Colon cancer Paternal Grandmother   . Pancreatic cancer Paternal Grandmother   . Stomach cancer Paternal Grandmother   . Breast cancer Cousin   . Esophageal cancer Neg Hx   . Rectal cancer Neg Hx    Past Surgical History:  Procedure Laterality Date  . ABDOMINAL HYSTERECTOMY  1/04   partial  . BLADDER SUSPENSION  6/10  . CARDIAC CATHETERIZATION    . carpel tunnel left/right  7/08, 8/08 Bilateral 8/08 and 7/08  . carpel tunnel rel Right 4/12  . CHOLECYSTECTOMY N/A 06/11/2014   Procedure: LAPAROSCOPIC CHOLECYSTECTOMY WITH ATTEMPTED INTRAOPERATIVE CHOLANGIOGRAM;  Surgeon: Jackolyn Confer, MD;  Location: [facility];  Service: General;  Laterality: N/A;  . COLONOSCOPY    . ERCP N/A 10/19/2014   Procedure: ENDOSCOPIC RETROGRADE CHOLANGIOPANCREATOGRAPHY (ERCP);  Surgeon: Ladene Artist, MD;  Location: [facility];  Service: Endoscopy;  Laterality: N/A;  . JOINT  REPLACEMENT    . KNEE ARTHROSCOPY Left 12/12  . KNEE ARTHROSCOPY Right 12/06  . LEFT HEART CATHETERIZATION WITH CORONARY ANGIOGRAM N/A 03/10/2014   Procedure: LEFT HEART CATHETERIZATION WITH CORONARY ANGIOGRAM;  Surgeon: Sinclair Grooms, MD;  Location: [facility] C[facility];  Service: Cardiovascular;  Laterality: N/A;  . PLANTAR FASCIA RELEASE Right 12/10  . radial tunnel release     right arm   . ROTATOR CUFF REPAIR Left 6/11  . tennis elbow release Right 7/04  . TOTAL KNEE ARTHROPLASTY Left 09/10/2012   Procedure: TOTAL KNEE ARTHROPLASTY- left;  Surgeon: Garald Balding, MD;  Location: [facility];  Service: Orthopedics;  Laterality: Left;  Left total knee  arthroplasty   Social History   Occupational History  . [employer] History Main Topics  . Smoking status: Never Smoker  . Smokeless tobacco: Never Used     Comment: Secondhand - from family growing up, workplace intermittently  . Alcohol use 0.0 oz/week     Comment: occasionally - intermittent, no more than twice a week  . Drug use: No  . Sexual activity: Not on file

## 2016-05-15 MED ORDER — TRIAMCINOLONE ACETONIDE 40 MG/ML IJ SUSP
80.0000 mg | INTRAMUSCULAR | Status: AC | PRN
  Administered 2016-05-12: 80 mg via INTRA_ARTICULAR

## 2016-05-15 MED ORDER — BUPIVACAINE HCL 0.5 % IJ SOLN
3.0000 mL | INTRAMUSCULAR | Status: AC | PRN
  Administered 2016-05-12: 3 mL via INTRA_ARTICULAR

## 2016-05-18 ENCOUNTER — Telehealth: Payer: Self-pay | Admitting: *Deleted

## 2016-05-18 NOTE — Telephone Encounter (Signed)
-----   Message from Sueanne Margarita, MD sent at 05/17/2016  3:23 PM EST ----- Please call [facility] and find out what the 95% pressure was - it is not listed

## 2016-05-18 NOTE — Telephone Encounter (Signed)
[place] at [facility] says it was a pressure of 14.1

## 2016-05-22 ENCOUNTER — Encounter: Payer: Self-pay | Admitting: Nurse Practitioner

## 2016-05-22 ENCOUNTER — Other Ambulatory Visit (INDEPENDENT_AMBULATORY_CARE_PROVIDER_SITE_OTHER): Payer: 59

## 2016-05-22 ENCOUNTER — Ambulatory Visit (INDEPENDENT_AMBULATORY_CARE_PROVIDER_SITE_OTHER)
Admission: RE | Admit: 2016-05-22 | Discharge: 2016-05-22 | Disposition: A | Discharge status: Home / Self Care | Payer: 59 | Source: Ambulatory Visit | Attending: Nurse Practitioner | Admitting: Nurse Practitioner

## 2016-05-22 ENCOUNTER — Ambulatory Visit (INDEPENDENT_AMBULATORY_CARE_PROVIDER_SITE_OTHER): Payer: 59 | Admitting: Nurse Practitioner

## 2016-05-22 VITALS — BP 118/76 | HR 82 | Temp 98.7°F | Ht 65.0 in | Wt 290.0 lb

## 2016-05-22 DIAGNOSIS — J40 Bronchitis, not specified as acute or chronic: Secondary | ICD-10-CM

## 2016-05-22 DIAGNOSIS — R1084 Generalized abdominal pain: Secondary | ICD-10-CM

## 2016-05-22 DIAGNOSIS — R05 Cough: Secondary | ICD-10-CM | POA: Diagnosis not present

## 2016-05-22 DIAGNOSIS — R0789 Other chest pain: Secondary | ICD-10-CM | POA: Diagnosis not present

## 2016-05-22 DIAGNOSIS — J181 Lobar pneumonia, unspecified organism: Secondary | ICD-10-CM | POA: Diagnosis not present

## 2016-05-22 DIAGNOSIS — J189 Pneumonia, unspecified organism: Secondary | ICD-10-CM

## 2016-05-22 DIAGNOSIS — R059 Cough, unspecified: Secondary | ICD-10-CM

## 2016-05-22 LAB — COMPREHENSIVE METABOLIC PANEL
ALBUMIN: 4.1 g/dL (ref 3.5–5.2)
ALK PHOS: 76 U/L (ref 39–117)
ALT: 18 U/L (ref 0–35)
AST: 14 U/L (ref 0–37)
BILIRUBIN TOTAL: 0.6 mg/dL (ref 0.2–1.2)
BUN: 11 mg/dL (ref 6–23)
CALCIUM: 9.9 mg/dL (ref 8.4–10.5)
CHLORIDE: 102 meq/L (ref 96–112)
CO2: 30 mEq/L (ref 19–32)
CREATININE: 0.94 mg/dL (ref 0.40–1.20)
GFR: 64.87 mL/min (ref >60.00)
Glucose, Bld: 99 mg/dL (ref 70–99)
Potassium: 4.4 mEq/L (ref 3.5–5.1)
SODIUM: 141 meq/L (ref 135–145)
TOTAL PROTEIN: 7.2 g/dL (ref 6.0–8.3)

## 2016-05-22 LAB — URINALYSIS, ROUTINE W REFLEX MICROSCOPIC
Bilirubin Urine: NEGATIVE
Ketones, ur: NEGATIVE
Nitrite: NEGATIVE
PH: 5.5 (ref 5.0–8.0)
SPECIFIC GRAVITY, URINE: 1.015 (ref 1.000–1.030)
Total Protein, Urine: NEGATIVE
UROBILINOGEN UA: 0.2 (ref 0.0–1.0)
Urine Glucose: NEGATIVE

## 2016-05-22 MED ORDER — ALBUTEROL SULFATE HFA 108 (90 BASE) MCG/ACT IN AERS: 2.0000 | INHALATION_SPRAY | Freq: Four times a day (QID) | RESPIRATORY_TRACT | 0 refills | Status: DC | PRN

## 2016-05-22 MED ORDER — DOXYCYCLINE HYCLATE 100 MG PO TABS: 100.0000 mg | ORAL_TABLET | Freq: Two times a day (BID) | ORAL | 0 refills | Status: AC

## 2016-05-22 NOTE — Progress Notes (Signed)
Subjective:  Patient ID: Heather Frederick, female    DOB: 02-28-1956  Age: 61 y.o. MRN: [id number]  CC: Abdominal Pain (Pt stated having spark pain everywhere in stomach and coughing)   Abdominal Pain  This is a chronic problem. The current episode started more than 1 year ago. The onset quality is gradual. The problem occurs intermittently. The problem has been waxing and waning. The pain is located in the generalized abdominal region. The quality of the pain is colicky. The abdominal pain does not radiate. Pertinent negatives include no anorexia, arthralgias, belching, constipation, dysuria, fever, flatus, frequency, headaches, hematochezia, hematuria, melena, myalgias, nausea, vomiting or weight loss. Nothing aggravates the pain. The pain is relieved by nothing. Heather Frederick has tried nothing for the symptoms.  Cough  This is a new problem. The current episode started yesterday. The problem has been unchanged. The problem occurs constantly. The cough is non-productive. Associated symptoms include chills. Pertinent negatives include no chest pain, fever, headaches, heartburn, hemoptysis, myalgias, nasal congestion, postnasal drip, rhinorrhea, sore throat, shortness of breath, sweats, weight loss or wheezing. Nothing aggravates the symptoms.     Outpatient Medications Prior to Visit  Medication Sig Dispense Refill  . acetaminophen (TYLENOL) 160 MG/5ML liquid Take 960 mg by mouth every 4 (four) hours as needed for fever.    Marland Kitchen amLODipine (NORVASC) 5 MG tablet Take 5 mg by mouth daily.     Marland Kitchen aspirin 81 MG tablet Take 81 mg by mouth daily.    . cholestyramine (QUESTRAN) 4 g packet Take 4 g by mouth 2 (two) times daily.    . citalopram (CELEXA) 20 MG tablet TAKE 1 TABLET BY MOUTH EVERY DAY 30 tablet 3  . CRANBERRY-VITAMIN C PO Take 4,200 mg by mouth every morning.     . eletriptan (RELPAX) 40 MG tablet Take 1 tablet (40 mg total) by mouth every 2 (two) hours as needed for migraine. 8 tablet 8  . loperamide  (IMODIUM) 2 MG capsule Take 4 mg by mouth as needed for diarrhea or loose stools.    Marland Kitchen loratadine (CLARITIN) 10 MG tablet Take 10 mg by mouth 2 (two) times daily as needed for allergies.     . Lutein-Zeaxanthin 25-5 MG CAPS Take 1 capsule by mouth every morning.     . Meclizine HCl (BONINE PO) Take 1 tablet by mouth 3 times/day as needed-between meals & bedtime.    . Methylcobalamin (METHYL B-12 PO) Take 2,500 mcg by mouth every morning.     . metoprolol succinate (TOPROL-XL) 25 MG 24 hr tablet Take 25 mg by mouth daily.     . Multiple Vitamin (MULTIVITAMIN WITH MINERALS) TABS Take 1 tablet by mouth daily.     . nitrofurantoin (MACRODANTIN) 100 MG capsule Take 100 mg by mouth daily.     . nitroGLYCERIN (NITROSTAT) 0.4 MG SL tablet Place 1 tablet (0.4 mg total) under the tongue every 5 (five) minutes as needed for chest pain. 25 tablet 5  . Omega-3 Fatty Acids (FISH OIL PO) Take 1,200 mg by mouth every morning.     . ondansetron (ZOFRAN) 8 MG tablet Take 1 tablet (8 mg total) by mouth every 6 (six) hours. 40 tablet 0  . oxybutynin (DITROPAN-XL) 10 MG 24 hr tablet Take 10 mg by mouth every morning.     . pantoprazole (PROTONIX) 40 MG tablet TAKE ONE (1) TABLET BY MOUTH TWO (2) TIMES DAILY 60 tablet 5  . pregabalin (LYRICA) 200 MG capsule TAKE ONE (1) CAPSULE  BY MOUTH EVERY MORNING 30 capsule 5  . ranitidine (ZANTAC) 300 MG tablet TAKE ONE TABLET BY MOUTH EVERY EVENING 30 tablet 11  . simvastatin (ZOCOR) 20 MG tablet TAKE ONE (1) TABLET BY MOUTH EVERY DAY 90 tablet 3  . spironolactone (ALDACTONE) 25 MG tablet TAKE ONE (1) TABLET BY MOUTH EVERY DAY 90 tablet 3   No facility-administered medications prior to visit.     ROS See HPI  Objective:  BP 118/76   Pulse 82   Temp 98.7 F (37.1 C)   Ht 5\' 5"  (1.651 m)   Wt 290 lb (131.5 kg)   SpO2 99%   BMI 48.26 kg/m   BP Readings from Last 3 Encounters:  05/22/16 118/76  05/12/16 (!) 111/59  05/11/16 106/70    Wt Readings from Last 3  Encounters:  05/22/16 290 lb (131.5 kg)  05/11/16 295 lb (133.8 kg)  05/06/16 299 lb (135.6 kg)    Physical Exam  Constitutional: Heather Frederick is oriented to person, place, and time. No distress.  HENT:  Right Ear: External ear normal.  Left Ear: External ear normal.  Nose: Nose normal.  Mouth/Throat: No oropharyngeal exudate.  Eyes: No scleral icterus.  Neck: Normal range of motion. Neck supple.  Cardiovascular: Normal rate, regular rhythm and normal heart sounds.   Pulmonary/Chest: Effort normal and breath sounds normal. No respiratory distress.  Abdominal: Soft. Heather Frederick exhibits no distension. There is no tenderness. There is no rebound and no guarding.  Musculoskeletal: Normal range of motion. Heather Frederick exhibits no edema.  Lymphadenopathy:    Heather Frederick has no cervical adenopathy.  Neurological: Heather Frederick is alert and oriented to person, place, and time.  Skin: Skin is warm and dry.  Psychiatric: Heather Frederick has a normal mood and affect. Her behavior is normal.  Vitals reviewed.   Lab Results  Component Value Date   WBC 4.5 05/07/2016   HGB 13.4 05/07/2016   HCT 39.8 05/07/2016   PLT 133 (L) 05/07/2016   GLUCOSE 99 05/22/2016   CHOL 146 07/28/2015   TRIG 133.0 07/28/2015   HDL 31.30 (L) 07/28/2015   LDLCALC 88 07/28/2015   ALT 18 05/22/2016   AST 14 05/22/2016   NA 141 05/22/2016   K 4.4 05/22/2016   CL 102 05/22/2016   CREATININE 0.94 05/22/2016   BUN 11 05/22/2016   CO2 30 05/22/2016   INR 1.10 06/09/2014   HGBA1C 5.9 07/28/2015   Today's CXR: LLL pneumonia and chronic bronchitis.  Assessment & Plan:   Heather Frederick was seen today for abdominal pain.  Diagnoses and all orders for this visit:  Generalized abdominal pain -     Urinalysis, Routine w reflex microscopic; Future -     Comprehensive metabolic panel; Future  Community acquired pneumonia of left lower lobe of lung ([place]) -     DG Chest 2 View; Future -     albuterol (PROVENTIL HFA;VENTOLIN HFA) 108 (90 Base) MCG/ACT inhaler; Inhale 2  puffs into the lungs every 6 (six) hours as needed for wheezing or shortness of breath. -     doxycycline (VIBRA-TABS) 100 MG tablet; Take 1 tablet (100 mg total) by mouth 2 (two) times daily.  Bronchitis -     DG Chest 2 View; Future -     albuterol (PROVENTIL HFA;VENTOLIN HFA) 108 (90 Base) MCG/ACT inhaler; Inhale 2 puffs into the lungs every 6 (six) hours as needed for wheezing or shortness of breath. -     doxycycline (VIBRA-TABS) 100 MG tablet; Take 1 tablet (  100 mg total) by mouth 2 (two) times daily.   I am having Heather Frederick start on albuterol and doxycycline. I am also having her maintain her loratadine, multivitamin with minerals, Lutein-Zeaxanthin, oxybutynin, CRANBERRY-VITAMIN C PO, Methylcobalamin (METHYL B-12 PO), Omega-3 Fatty Acids (FISH OIL PO), aspirin, nitroGLYCERIN, nitrofurantoin, eletriptan, pregabalin, metoprolol succinate, ranitidine, citalopram, pantoprazole, simvastatin, spironolactone, amLODipine, cholestyramine, Meclizine HCl (BONINE PO), acetaminophen, loperamide, and ondansetron.  Meds ordered this encounter  Medications  . albuterol (PROVENTIL HFA;VENTOLIN HFA) 108 (90 Base) MCG/ACT inhaler    Sig: Inhale 2 puffs into the lungs every 6 (six) hours as needed for wheezing or shortness of breath.    Dispense:  1 Inhaler    Refill:  0    Order Specific Question:   Supervising Provider    Answer:   Cassandria Anger [[id number]]  . doxycycline (VIBRA-TABS) 100 MG tablet    Sig: Take 1 tablet (100 mg total) by mouth 2 (two) times daily.    Dispense:  14 tablet    Refill:  0    Order Specific Question:   Supervising Provider    Answer:   Cassandria Anger [[id number]]    Follow-up: Return in about 1 week (around 05/29/2016) for pneumonia with Dr. Sharlet Salina.  Wilfred Lacy, NP

## 2016-05-22 NOTE — Patient Instructions (Signed)
Go to basement for blood draw and CXR. You will be called with results.  Follow up with GI.  Use medications as prescribed.

## 2016-05-26 ENCOUNTER — Other Ambulatory Visit: Payer: Self-pay | Admitting: Internal Medicine

## 2016-05-26 ENCOUNTER — Other Ambulatory Visit: Payer: Self-pay | Admitting: Nurse Practitioner

## 2016-05-29 NOTE — Telephone Encounter (Signed)
Rx for lyrica put in Ailanie(NP) in basket for signature. Rn will fax once sign.

## 2016-05-30 ENCOUNTER — Ambulatory Visit (INDEPENDENT_AMBULATORY_CARE_PROVIDER_SITE_OTHER): Payer: 59 | Admitting: Internal Medicine

## 2016-05-30 ENCOUNTER — Encounter: Payer: Self-pay | Admitting: Internal Medicine

## 2016-05-30 VITALS — BP 138/86 | HR 83 | Temp 98.5°F | Ht 65.0 in | Wt 286.0 lb

## 2016-05-30 DIAGNOSIS — J181 Lobar pneumonia, unspecified organism: Secondary | ICD-10-CM

## 2016-05-30 DIAGNOSIS — J189 Pneumonia, unspecified organism: Secondary | ICD-10-CM | POA: Insufficient documentation

## 2016-05-30 MED ORDER — HYDROCODONE-HOMATROPINE 5-1.5 MG/5ML PO SYRP: 5.0000 mL | ORAL_SOLUTION | Freq: Three times a day (TID) | ORAL | 0 refills | Status: DC | PRN

## 2016-05-30 NOTE — Progress Notes (Signed)
Pre visit review using our clinic review tool, if applicable. No additional management support is needed unless otherwise documented below in the visit note. 

## 2016-05-30 NOTE — Patient Instructions (Signed)
We have given you the cough syrup that you can use to stop the coughing at night time.   We will put in the order for the chest x-ray so you can come back the first week of April anytime to get it done.

## 2016-05-30 NOTE — Progress Notes (Signed)
   Subjective:    Patient ID: Heather Frederick, female    DOB: 1955/07/21, 61 y.o.   MRN: [id number]  HPI The patient is a 61 YO female coming in for follow up of CAP. She was treated with doxycycline and albuterol inhaler and just finished antibiotics about 1 day ago. She is overall gradually improving. She is coughing a little but less. Still getting some coughing fits. Hard to sleep at night time with the coughing. Breathing is a little bit better and not using albuterol as often as 1 week ago. Some fatigue still and not doing usual activities. Has elective surgery scheduled for 1 month from now and is not sure if she will be feeling better in time. She denies fevers or chills. Sinus drainage is improving. No sinus pressure.   Review of Systems  Constitutional: Positive for activity change and fatigue. Negative for appetite change, chills, fever and unexpected weight change.  HENT: Positive for congestion and rhinorrhea. Negative for ear discharge, ear pain, nosebleeds, postnasal drip, sinus pain, sinus pressure, sore throat and trouble swallowing.   Eyes: Negative.   Respiratory: Positive for cough. Negative for chest tightness, shortness of breath and wheezing.   Cardiovascular: Negative.   Gastrointestinal: Negative.   Musculoskeletal: Negative.   Neurological: Negative.       Objective:   Physical Exam  Constitutional: She is oriented to person, place, and time. She appears well-developed and well-nourished.  Obese  HENT:  Head: Normocephalic and atraumatic.  Right Ear: External ear normal.  Left Ear: External ear normal.  Oropharynx with redness and clear drainage, no sinus pressure.   Eyes: EOM are normal.  Neck: Normal range of motion. No JVD present.  Cardiovascular: Normal rate and regular rhythm.   Pulmonary/Chest: Effort normal and breath sounds normal. No respiratory distress. She has no wheezes. She has no rales.  Abdominal: Soft.  Lymphadenopathy:    She has no cervical  adenopathy.  Neurological: She is alert and oriented to person, place, and time.  Skin: Skin is warm and dry.   Vitals:   05/30/16 0843  BP: 138/86  Pulse: 83  Temp: 98.5 F (36.9 C)  TempSrc: Oral  SpO2: 98%  Weight: 286 lb (129.7 kg)  Height: 5\' 5"  (1.651 m)      Assessment & Plan:

## 2016-05-30 NOTE — Assessment & Plan Note (Signed)
CXR ordered for 1 month for now. She has finished antibiotics and lungs sound improved. Breathing is gradually improving. Rx for hycodan cough syrup for the night time coughing and we discussed that the cough could last for several more weeks. If she is still coughing would push back her elective hernia repair.

## 2016-06-22 NOTE — Patient Instructions (Addendum)
Heather Frederick  06/22/2016   Your procedure is scheduled on: Friday 06-30-16  Report to [facility] Main  Entrance take [facility]  elevators to 3rd floor to  [place] at [place] AM.  Call this number if you have problems the morning of surgery [phone]   Remember: ONLY 1 PERSON MAY GO WITH YOU TO SHORT STAY TO GET  READY MORNING OF [place].  Do not eat food or drink liquids :After Midnight.     Take these medicines the morning of surgery with A SIP OF WATER: Amlodipine ( Novasc), Ditropan( oxybutynin), Pantoprazole ( Protonix), Lyrica, Citalopram( Celexa), Claritin if needed                                You may not have any metal on your body including hair pins and              piercings  Do not wear jewelry, make-up, lotions, powders or perfumes, deodorant             Do not wear nail polish.  Do not shave  48 hours prior to surgery.           Do not bring valuables to the hospital. [place].  Contacts, dentures or bridgework may not be worn into surgery.  Leave suitcase in the car. After surgery it may be brought to your room.     Patients discharged the day of surgery will not be allowed to drive home.  Name and phone number of your driver:  Special Instructions:Please bring CPAP mask and tubing the day of surgery             Please read over the following fact sheets you were given: _____________________________________________________________________             [facility] - Preparing for Surgery Before surgery, you can play an important role.  Because skin is not sterile, your skin needs to be as free of germs as possible.  You can reduce the number of germs on your skin by washing with CHG (chlorahexidine gluconate) soap before surgery.  CHG is an antiseptic cleaner which kills germs and bonds with the skin to continue killing germs even after washing. Please DO NOT use if you have an  allergy to CHG or antibacterial soaps.  If your skin becomes reddened/irritated stop using the CHG and inform your nurse when you arrive at Short Stay. Do not shave (including legs and underarms) for at least 48 hours prior to the first CHG shower.  You may shave your face/neck. Please follow these instructions carefully:  1.  Shower with CHG Soap the night before surgery and the  morning of Surgery.  2.  If you choose to wash your hair, wash your hair first as usual with your  normal  shampoo.  3.  After you shampoo, rinse your hair and body thoroughly to remove the  shampoo.                           4.  Use CHG as you would any other liquid soap.  You can apply chg directly  to the skin and wash  Gently with a scrungie or clean washcloth.  5.  Apply the CHG Soap to your body ONLY FROM THE NECK DOWN.   Do not use on face/ open                           Wound or open sores. Avoid contact with eyes, ears mouth and genitals (private parts).                       Wash face,  Genitals (private parts) with your normal soap.             6.  Wash thoroughly, paying special attention to the area where your surgery  will be performed.  7.  Thoroughly rinse your body with warm water from the neck down.  8.  DO NOT shower/wash with your normal soap after using and rinsing off  the CHG Soap.                9.  Pat yourself dry with a clean towel.            10.  Wear clean pajamas.            11.  Place clean sheets on your bed the night of your first shower and do not  sleep with pets. Day of Surgery : Do not apply any lotions/deodorants the morning of surgery.  Please wear clean clothes to the hospital/surgery center.  FAILURE TO FOLLOW THESE INSTRUCTIONS MAY RESULT IN THE CANCELLATION OF YOUR SURGERY PATIENT SIGNATURE_________________________________  NURSE SIGNATURE__________________________________  ________________________________________________________________________

## 2016-06-26 ENCOUNTER — Ambulatory Visit (INDEPENDENT_AMBULATORY_CARE_PROVIDER_SITE_OTHER)
Admission: RE | Admit: 2016-06-26 | Discharge: 2016-06-26 | Disposition: A | Discharge status: Home / Self Care | Payer: 59 | Source: Ambulatory Visit | Attending: Internal Medicine | Admitting: Internal Medicine

## 2016-06-26 ENCOUNTER — Encounter: Payer: Self-pay | Admitting: Nurse Practitioner

## 2016-06-26 ENCOUNTER — Ambulatory Visit (INDEPENDENT_AMBULATORY_CARE_PROVIDER_SITE_OTHER): Payer: 59 | Admitting: Nurse Practitioner

## 2016-06-26 VITALS — BP 106/62 | HR 70 | Wt 293.8 lb

## 2016-06-26 DIAGNOSIS — G43009 Migraine without aura, not intractable, without status migrainosus: Secondary | ICD-10-CM

## 2016-06-26 DIAGNOSIS — G5711 Meralgia paresthetica, right lower limb: Secondary | ICD-10-CM | POA: Diagnosis not present

## 2016-06-26 DIAGNOSIS — J181 Lobar pneumonia, unspecified organism: Secondary | ICD-10-CM

## 2016-06-26 DIAGNOSIS — J189 Pneumonia, unspecified organism: Secondary | ICD-10-CM

## 2016-06-26 MED ORDER — ELETRIPTAN HYDROBROMIDE 40 MG PO TABS: 40.0000 mg | ORAL_TABLET | ORAL | 11 refills | Status: DC | PRN

## 2016-06-26 MED ORDER — PREGABALIN 200 MG PO CAPS: ORAL_CAPSULE | ORAL | 5 refills | Status: DC

## 2016-06-26 NOTE — Progress Notes (Signed)
I have read the note, and I agree with the clinical assessment and plan.  Selby Foisy KEITH   

## 2016-06-26 NOTE — Progress Notes (Signed)
[facility]  PATIENT: Heather Frederick DOB: 1955-09-08   REASON FOR VISIT: follow up for Migraine, meralgia paresthetica right side HISTORY FROM: Patient    HISTORY OF PRESENT ILLNESS:Heather Frederick is a 61 year old right-handed white female with a history of morbid obesity and a history of migraine headache. She also has a history of obstructive sleep apnea.  and is on CPAPat night. The patient has done well with her migraines.She is  retired from her job. The patient has had 4 headaches/migraines in the last 6 months.  The patient is  on Toprol, which she is currently taking. The patient is on Relpax if needed acutely for the headache. The patient indicates that if she can get to the headache quickly, the headache will only last about 15-20 minutes with the Relpax.  She is on Lyrica for her meralgia paresthetica with control of her pain.  She is due to have hernia surgery this Friday . She was hospitalized in February for the flu. She returns for an evaluation.   REVIEW OF SYSTEMS: Full 14 system review of systems performed and notable only for those listed, all others are neg:  Constitutional: neg  Cardiovascular: neg Ear/Nose/Throat: Hard of hearing and does not wear hearing aids Skin: neg Eyes: neg Respiratory: neg Gastroitestinal: Abdominal pain  Hematology/Lymphatic: neg  Endocrine: neg Musculoskeletal:nDegenerative disc disease ALLERGIES/Immunology: neg Neurological: history of headaches in good control Psychiatric:   Sleep :  obstructive sleep apnea with CPAP    ALLERGIES: Allergies  Allergen Reactions  . Topamax [Topiramate] Other (See Comments)    :Stroke like symptoms  . Aleve [Naproxen Sodium] Hives    Has tolerated Voltaren topical as well as aspirin.  . Bee Venom Swelling  . Echinacea Hives  . Other Other (See Comments)    Feathers cause sinus congestion  . Sulfa Antibiotics Hives  . Advil [Ibuprofen] Hives    Has tolerated Voltaren topical as  well as aspirin.    HOME MEDICATIONS: Outpatient Medications Prior to Visit  Medication Sig Dispense Refill  . acetaminophen (TYLENOL) 650 MG CR tablet Take 1,300 mg by mouth every 8 (eight) hours as needed for pain.    Marland Kitchen amLODipine (NORVASC) 5 MG tablet Take 5 mg by mouth daily.     Marland Kitchen aspirin EC 81 MG tablet Take 81 mg by mouth daily.    . Biotin 2500 MCG CAPS Take by mouth.    . cholestyramine (QUESTRAN) 4 g packet Take 4 g by mouth daily.     . citalopram (CELEXA) 20 MG tablet TAKE ONE (1) TABLET BY MOUTH EVERY DAY 30 tablet 3  . Cranberry-Vitamin C-Vitamin E (CRANBERRY PLUS VITAMIN C PO) Take 1 tablet by mouth daily. 4200 mg    . Cyanocobalamin (HM SUPER VITAMIN B12) 2500 MCG CHEW Chew 2,500 mg by mouth daily.    . diclofenac sodium (VOLTAREN) 1 % GEL Apply 1 g topically 4 (four) times daily as needed (for pain).    Marland Kitchen eletriptan (RELPAX) 40 MG tablet Take 1 tablet (40 mg total) by mouth every 2 (two) hours as needed for migraine. 8 tablet 8  . Lactobacillus Rhamnosus, GG, (CULTURELLE PO) Take 1 capsule by mouth daily.    Marland Kitchen loratadine (CLARITIN) 10 MG tablet Take 10-20 mg by mouth 2 (two) times daily as needed for allergies.     Marland Kitchen LYRICA 200 MG capsule TAKE ONE (1) CAPSULE BY MOUTH EVERY MORNING 30 capsule 0  . Multiple Vitamin (MULTIVITAMIN WITH MINERALS) TABS Take 1  tablet by mouth daily.     . Multiple Vitamins-Minerals (OCUVITE ADULT 50+ PO) Take 1 tablet by mouth daily.    . nitrofurantoin (MACRODANTIN) 100 MG capsule Take 100 mg by mouth daily.     . nitroGLYCERIN (NITROSTAT) 0.4 MG SL tablet Place 1 tablet (0.4 mg total) under the tongue every 5 (five) minutes as needed for chest pain. 25 tablet 5  . Omega-3 Fatty Acids (FISH OIL PO) Take 1,200 mg by mouth every morning.     Marland Kitchen oxybutynin (DITROPAN-XL) 10 MG 24 hr tablet Take 10 mg by mouth every morning.     . pantoprazole (PROTONIX) 40 MG tablet TAKE ONE (1) TABLET BY MOUTH TWO (2) TIMES DAILY 60 tablet 5  . ranitidine (ZANTAC)  300 MG tablet TAKE ONE TABLET BY MOUTH EVERY EVENING 30 tablet 11  . simvastatin (ZOCOR) 20 MG tablet TAKE ONE (1) TABLET BY MOUTH EVERY DAY 90 tablet 3  . spironolactone (ALDACTONE) 25 MG tablet TAKE ONE (1) TABLET BY MOUTH EVERY DAY 90 tablet 3  . albuterol (PROVENTIL HFA;VENTOLIN HFA) 108 (90 Base) MCG/ACT inhaler Inhale 2 puffs into the lungs every 6 (six) hours as needed for wheezing or shortness of breath. (Patient not taking: Reported on 06/26/2016) 1 Inhaler 0  . HYDROcodone-homatropine (HYCODAN) 5-1.5 MG/5ML syrup Take 5 mLs by mouth every 8 (eight) hours as needed for cough. (Patient not taking: Reported on 06/22/2016) 120 mL 0  . ondansetron (ZOFRAN) 8 MG tablet Take 1 tablet (8 mg total) by mouth every 6 (six) hours. (Patient not taking: Reported on 05/30/2016) 40 tablet 0   No facility-administered medications prior to visit.     PAST MEDICAL HISTORY: Past Medical History:  Diagnosis Date  . Allergy   . Anxiety   . Arthritis   . Benign essential HTN 09/23/2014  . Chronic kidney disease    uti  . Depression   . Dyslipidemia   . Elevated liver enzymes   . Family history of anesthesia complication    father has a severe hard time waking up  . Gallstones    a. Seen on CT 01/2014.  Marland Kitchen GERD (gastroesophageal reflux disease)   . Hard of hearing   . Hepatic steatosis   . History of frequent urinary tract infections   . Hyperlipidemia   . Hypertension   . Lateral epicondylitis of right elbow   . Mental disorder   . Meralgia paresthetica of right side 10/02/2012   slight at 05/2014  . Migraine headache   . Obesity   . OSA (obstructive sleep apnea)    severe with AHI 37/hr now on CPAP at 18cm H2O  . Osteoarthritis   . Raynaud disease    in feet per patient   . Sleep apnea    wears c-pap  . Urinary tract infection     PAST SURGICAL HISTORY: Past Surgical History:  Procedure Laterality Date  . ABDOMINAL HYSTERECTOMY  1/04   partial  . BLADDER SUSPENSION  6/10  . CARDIAC  CATHETERIZATION    . carpel tunnel left/right  7/08, 8/08 Bilateral 8/08 and 7/08  . carpel tunnel rel Right 4/12  . CHOLECYSTECTOMY N/A 06/11/2014   Procedure: LAPAROSCOPIC CHOLECYSTECTOMY WITH ATTEMPTED INTRAOPERATIVE CHOLANGIOGRAM;  Surgeon: Jackolyn Confer, MD;  Location: [facility];  Service: General;  Laterality: N/A;  . COLONOSCOPY    . ERCP N/A 10/19/2014   Procedure: ENDOSCOPIC RETROGRADE CHOLANGIOPANCREATOGRAPHY (ERCP);  Surgeon: Ladene Artist, MD;  Location: [facility];  Service: Endoscopy;  Laterality:  N/A;  . JOINT REPLACEMENT    . KNEE ARTHROSCOPY Left 12/12  . KNEE ARTHROSCOPY Right 12/06  . LEFT HEART CATHETERIZATION WITH CORONARY ANGIOGRAM N/A 03/10/2014   Procedure: LEFT HEART CATHETERIZATION WITH CORONARY ANGIOGRAM;  Surgeon: Sinclair Grooms, MD;  Location: [facility] CATH LAB;  Service: Cardiovascular;  Laterality: N/A;  . PLANTAR FASCIA RELEASE Right 12/10  . radial tunnel release     right arm   . ROTATOR CUFF REPAIR Left 6/11  . tennis elbow release Right 7/04  . TOTAL KNEE ARTHROPLASTY Left 09/10/2012   Procedure: TOTAL KNEE ARTHROPLASTY- left;  Surgeon: Garald Balding, MD;  Location: [place];  Service: Orthopedics;  Laterality: Left;  Left total knee arthroplasty    FAMILY HISTORY: Family History  Problem Relation Age of Onset  . Hypertension Mother   . Stroke Mother   . Liver disease Mother     Abcess  . Hypertension Father   . Coronary artery disease Father   . Migraines Father   . Clotting disorder Father   . Kidney failure Brother   . Hypertension Brother   . Migraines Brother   . Migraines Daughter   . Breast cancer Other     Niece with breast cancer  . Colon cancer Paternal Grandmother   . Pancreatic cancer Paternal Grandmother   . Stomach cancer Paternal Grandmother   . Breast cancer Cousin   . Esophageal cancer Neg Hx   . Rectal cancer Neg Hx     SOCIAL HISTORY: Social History   Social History  . Marital status: Married    Spouse name:  Remo Lipps  . Number of children: 2  . Years of education: 16   Occupational History  . [place] History Main Topics  . Smoking status: Never Smoker  . Smokeless tobacco: Never Used     Comment: Secondhand - from family growing up, workplace intermittently  . Alcohol use 0.0 oz/week     Comment: occasionally - intermittent, no more than twice a week  . Drug use: No  . Sexual activity: Not on file   Other Topics Concern  . Not on file   Social History Narrative   Patient is married Remo Lipps) and lives at home with her husband and child.   Patient has two children.   Patient has a college education.   Patient is right-handed.   Patient drinks 1-2 cup of coffee/tea daily.     PHYSICAL EXAM  Vitals:   06/26/16 0835  BP: 106/62  Pulse: 70  Weight: 293 lb 12.8 oz (133.3 kg)   Body mass index is 48.89 kg/m.  Generalized: Well developed, morbidly obese female in no acute distress  Head: normocephalic and atraumatic,. Oropharynx benign  Neck: Supple, no carotid bruits  Cardiac: Regular rate rhythm, no murmur  Musculoskeletal: No deformity   Neurological examination   Mentation: Alert oriented to time, place, history taking. Attention span and concentration appropriate. Recent and remote memory intact.  Follows all commands speech and language fluent.   Cranial nerve II-XII: Fundoscopic exam reveals sharp disc margins.Pupils were equal round reactive to light extraocular movements were full, visual field were full on confrontational test. Facial sensation and strength were normal. Very hard of hearing . Uvula tongue midline. head turning and shoulder shrug were normal and symmetric.Tongue protrusion into cheek strength was normal. Motor: normal bulk and tone, full strength in the BUE, BLE,  Sensory: normal and symmetric to light touch, on the face  arms and legs Coordination: finger-nose-finger, heel-to-shin bilaterally, no dysmetria Reflexes:  Symmetric upper and lower plantar responses were flexor bilaterally. Gait and Station: Rising up from seated position without assistance, wide based stance  moderate stride, good arm swing, smooth turning, able to perform tiptoe, and heel walking without difficulty. Tandem gait is steady     DIAGNOSTIC DATA (LABS, IMAGING, TESTING) - I reviewed patient records, labs, notes, testing and imaging myself where available.  Lab Results  Component Value Date   WBC 4.5 05/07/2016   HGB 13.4 05/07/2016   HCT 39.8 05/07/2016   MCV 87.3 05/07/2016   PLT 133 (L) 05/07/2016      Component Value Date/Time   NA 141 05/22/2016 1116   K 4.4 05/22/2016 1116   CL 102 05/22/2016 1116   CO2 30 05/22/2016 1116   GLUCOSE 99 05/22/2016 1116   BUN 11 05/22/2016 1116   CREATININE 0.94 05/22/2016 1116   CALCIUM 9.9 05/22/2016 1116   PROT 7.2 05/22/2016 1116   ALBUMIN 4.1 05/22/2016 1116   AST 14 05/22/2016 1116   ALT 18 05/22/2016 1116   ALKPHOS 76 05/22/2016 1116   BILITOT 0.6 05/22/2016 1116   GFRNONAA >60 05/07/2016 0535   GFRAA >60 05/07/2016 0535     ASSESSMENT AND PLAN  61 y.o. year old female  has a past medical history of Anxiety;  Arthritis;  Migraine headache; Obesity; Depression;  Meralgia paresthetica of right side (10/02/2012); Benign essential HTN (09/23/2014);  and OSA (obstructive sleep apnea). Here to follow-up for her migraines and meralgia paresthetica.Recent labs reviewed from February 2018  Continue Lyrica 200 mg daily will refill Continue Relpax acutely will refill  Continue Toprol daily refilled by Dr. Tamala Julian Follow-up in 1 year Call for increase in headaches, keep a  record Dennie Bible, [facility], [facility], [facility] [address], [place], [place] [address] [phone]

## 2016-06-26 NOTE — Patient Instructions (Addendum)
Continue Lyrica 200 mg daily will refill Continue Relpax acutely will refill  Continue Toprol daily refilled by Dr. Tamala Julian Follow-up in 1 year

## 2016-06-27 ENCOUNTER — Encounter (HOSPITAL_COMMUNITY): Payer: Self-pay

## 2016-06-27 ENCOUNTER — Encounter (HOSPITAL_COMMUNITY)
Admission: RE | Admit: 2016-06-27 | Discharge: 2016-06-27 | Disposition: A | Discharge status: Home / Self Care | Payer: 59 | Source: Ambulatory Visit | Attending: General Surgery | Admitting: General Surgery

## 2016-06-27 DIAGNOSIS — K432 Incisional hernia without obstruction or gangrene: Secondary | ICD-10-CM | POA: Insufficient documentation

## 2016-06-27 DIAGNOSIS — Z01818 Encounter for other preprocedural examination: Secondary | ICD-10-CM | POA: Insufficient documentation

## 2016-06-27 LAB — BASIC METABOLIC PANEL
Anion gap: 7 (ref 5–15)
BUN: 10 mg/dL (ref 6–20)
CHLORIDE: 106 mmol/L (ref 101–111)
CO2: 27 mmol/L (ref 22–32)
Calcium: 9.4 mg/dL (ref 8.9–10.3)
Creatinine, Ser: 0.83 mg/dL (ref 0.44–1.00)
GFR calc non Af Amer: >60 mL/min (ref >60)
Glucose, Bld: 132 mg/dL — ABNORMAL HIGH (ref 65–99)
POTASSIUM: 4.1 mmol/L (ref 3.5–5.1)
Sodium: 140 mmol/L (ref 135–145)

## 2016-06-27 LAB — CBC
HCT: 40.1 % (ref 36.0–46.0)
HEMOGLOBIN: 13.4 g/dL (ref 12.0–15.0)
MCH: 28.6 pg (ref 26.0–34.0)
MCHC: 33.4 g/dL (ref 30.0–36.0)
MCV: 85.7 fL (ref 78.0–100.0)
Platelets: 173 10*3/uL (ref 150–400)
RBC: 4.68 MIL/uL (ref 3.87–5.11)
RDW: 14 % (ref 11.5–15.5)
WBC: 6.1 10*3/uL (ref 4.0–10.5)

## 2016-06-27 NOTE — Progress Notes (Signed)
EKG-12/15/15-epic  CXR-06/26/16-epic  03/09/16-ECHO-EPIC

## 2016-06-28 ENCOUNTER — Encounter: Payer: Self-pay | Admitting: Cardiology

## 2016-06-29 ENCOUNTER — Telehealth: Payer: Self-pay | Admitting: *Deleted

## 2016-06-29 ENCOUNTER — Encounter (HOSPITAL_COMMUNITY): Payer: Self-pay | Admitting: Anesthesiology

## 2016-06-29 MED ORDER — DEXTROSE 5 % IV SOLN
3.0000 g | INTRAVENOUS | Status: AC
  Administered 2016-06-30: 3 g via INTRAVENOUS
  Filled 2016-06-29: qty 3

## 2016-06-29 NOTE — Anesthesia Preprocedure Evaluation (Addendum)
Anesthesia Evaluation  Patient identified by MRN, date of birth, ID band Patient awake    Reviewed: Allergy & Precautions, NPO status , Patient's Chart, lab work & pertinent test results  Airway Mallampati: IV  TM Distance: >3 FB Neck ROM: Full    Dental  (+) Loose,    Pulmonary neg pulmonary ROS, sleep apnea ,    breath sounds clear to auscultation       Cardiovascular hypertension, Pt. on medications + Peripheral Vascular Disease  negative cardio ROS   Rhythm:Regular Rate:Normal     Neuro/Psych  Headaches, PSYCHIATRIC DISORDERS Anxiety Depression  Neuromuscular disease negative neurological ROS     GI/Hepatic Neg liver ROS, GERD  Medicated,  Endo/Other  negative endocrine ROS  Renal/GU CRFRenal disease  negative genitourinary   Musculoskeletal  (+) Arthritis ,   Abdominal   Peds  Hematology negative hematology ROS (+)   Anesthesia Other Findings HLD  Day of surgery medications reviewed with the patient.  Reproductive/Obstetrics negative OB ROS                            Lab Results  Component Value Date   WBC 6.1 06/27/2016   HGB 13.4 06/27/2016   HCT 40.1 06/27/2016   MCV 85.7 06/27/2016   PLT 173 06/27/2016   Lab Results  Component Value Date   CREATININE 0.83 06/27/2016   BUN 10 06/27/2016   NA 140 06/27/2016   K 4.1 06/27/2016   CL 106 06/27/2016   CO2 27 06/27/2016   Lab Results  Component Value Date   INR 1.10 06/09/2014   INR 1.1 (H) 03/09/2014   INR 1.08 09/10/2012   EKG: normal sinus rhythm.  Echo: - Left ventricle: The cavity size was normal. Systolic function was   normal. The estimated ejection fraction was in the range of 60%   to 65%. Wall motion was normal; there were no regional wall   motion abnormalities. Doppler parameters are consistent with   abnormal left ventricular relaxation (grade 1 diastolic   dysfunction). There were intermediate  ventricular filling   pressure by Doppler parameters. - Aortic valve: Poorly visualized. Transvalvular velocity was   within the normal range. There was no stenosis. Aortic   regurgitation couldn&'t be evaluated. - Aortic root: The aortic root was normal in size. - Left atrium: The atrium was normal in size. - Right ventricle: The cavity size was normal. Wall thickness was   normal. Systolic function was normal. - Pulmonary arteries: Not visualized.  Anesthesia Physical Anesthesia Plan  ASA: III  Anesthesia Plan: General   Post-op Pain Management:    Induction: Intravenous  Airway Management Planned: Oral ETT and Video Laryngoscope Planned  Additional Equipment:   Intra-op Plan:   Post-operative Plan: Extubation in OR  Informed Consent: I have reviewed the patients History and Physical, chart, labs and discussed the procedure including the risks, benefits and alternatives for the proposed anesthesia with the patient or authorized representative who has indicated his/her understanding and acceptance.   Dental advisory given  Plan Discussed with: CRNA  Anesthesia Plan Comments:        Anesthesia Quick Evaluation

## 2016-06-30 ENCOUNTER — Ambulatory Visit (HOSPITAL_COMMUNITY): Payer: 59 | Admitting: Anesthesiology

## 2016-06-30 ENCOUNTER — Ambulatory Visit (HOSPITAL_COMMUNITY)
Admission: RE | Admit: 2016-06-30 | Discharge: 2016-07-01 | Disposition: A | Discharge status: Home / Self Care | Payer: 59 | Source: Ambulatory Visit | Attending: General Surgery | Admitting: General Surgery

## 2016-06-30 ENCOUNTER — Encounter (HOSPITAL_COMMUNITY): Payer: Self-pay

## 2016-06-30 ENCOUNTER — Encounter (HOSPITAL_COMMUNITY)
Admission: RE | Disposition: A | Discharge status: Home / Self Care | Payer: Self-pay | Source: Ambulatory Visit | Attending: General Surgery

## 2016-06-30 DIAGNOSIS — Z6841 Body Mass Index (BMI) 40.0 and over, adult: Secondary | ICD-10-CM | POA: Insufficient documentation

## 2016-06-30 DIAGNOSIS — K432 Incisional hernia without obstruction or gangrene: Secondary | ICD-10-CM | POA: Diagnosis present

## 2016-06-30 DIAGNOSIS — I739 Peripheral vascular disease, unspecified: Secondary | ICD-10-CM | POA: Insufficient documentation

## 2016-06-30 DIAGNOSIS — E785 Hyperlipidemia, unspecified: Secondary | ICD-10-CM | POA: Insufficient documentation

## 2016-06-30 DIAGNOSIS — F329 Major depressive disorder, single episode, unspecified: Secondary | ICD-10-CM | POA: Insufficient documentation

## 2016-06-30 DIAGNOSIS — Z96652 Presence of left artificial knee joint: Secondary | ICD-10-CM | POA: Insufficient documentation

## 2016-06-30 DIAGNOSIS — G4733 Obstructive sleep apnea (adult) (pediatric): Secondary | ICD-10-CM | POA: Insufficient documentation

## 2016-06-30 DIAGNOSIS — K430 Incisional hernia with obstruction, without gangrene: Secondary | ICD-10-CM | POA: Insufficient documentation

## 2016-06-30 DIAGNOSIS — Z7982 Long term (current) use of aspirin: Secondary | ICD-10-CM | POA: Insufficient documentation

## 2016-06-30 DIAGNOSIS — K219 Gastro-esophageal reflux disease without esophagitis: Secondary | ICD-10-CM | POA: Insufficient documentation

## 2016-06-30 DIAGNOSIS — N189 Chronic kidney disease, unspecified: Secondary | ICD-10-CM | POA: Insufficient documentation

## 2016-06-30 DIAGNOSIS — I129 Hypertensive chronic kidney disease with stage 1 through stage 4 chronic kidney disease, or unspecified chronic kidney disease: Secondary | ICD-10-CM | POA: Insufficient documentation

## 2016-06-30 DIAGNOSIS — G5710 Meralgia paresthetica, unspecified lower limb: Secondary | ICD-10-CM | POA: Insufficient documentation

## 2016-06-30 DIAGNOSIS — G43909 Migraine, unspecified, not intractable, without status migrainosus: Secondary | ICD-10-CM | POA: Insufficient documentation

## 2016-06-30 DIAGNOSIS — Z9989 Dependence on other enabling machines and devices: Secondary | ICD-10-CM | POA: Insufficient documentation

## 2016-06-30 HISTORY — PX: INCISIONAL HERNIA REPAIR: SHX193

## 2016-06-30 HISTORY — PX: INSERTION OF MESH: SHX5868

## 2016-06-30 HISTORY — DX: Pneumonia, unspecified organism: J18.9

## 2016-06-30 SURGERY — REPAIR, HERNIA, INCISIONAL, LAPAROSCOPIC
Anesthesia: General | Site: Abdomen

## 2016-06-30 MED ORDER — LACTATED RINGERS IV SOLN: INTRAVENOUS | Status: DC

## 2016-06-30 MED ORDER — CITALOPRAM HYDROBROMIDE 20 MG PO TABS
20.0000 mg | ORAL_TABLET | Freq: Every day | ORAL | Status: DC
  Administered 2016-07-01: 20 mg via ORAL
  Filled 2016-06-30: qty 1

## 2016-06-30 MED ORDER — LABETALOL HCL 5 MG/ML IV SOLN
INTRAVENOUS | Status: DC | PRN
Start: 2016-06-30 — End: 2016-06-30
  Administered 2016-06-30: 5 mg via INTRAVENOUS

## 2016-06-30 MED ORDER — METHOCARBAMOL 500 MG PO TABS
500.0000 mg | ORAL_TABLET | Freq: Four times a day (QID) | ORAL | Status: DC
  Administered 2016-06-30 – 2016-07-01 (×5): 500 mg via ORAL
  Filled 2016-06-30 (×5): qty 1

## 2016-06-30 MED ORDER — ALBUTEROL SULFATE (2.5 MG/3ML) 0.083% IN NEBU: 2.5000 mg | INHALATION_SOLUTION | Freq: Four times a day (QID) | RESPIRATORY_TRACT | Status: DC | PRN

## 2016-06-30 MED ORDER — PROPOFOL 10 MG/ML IV BOLUS
INTRAVENOUS | Status: DC | PRN
  Administered 2016-06-30: 200 mg via INTRAVENOUS

## 2016-06-30 MED ORDER — KETAMINE HCL 10 MG/ML IJ SOLN
INTRAMUSCULAR | Status: DC | PRN
  Administered 2016-06-30: 30 mg via INTRAVENOUS

## 2016-06-30 MED ORDER — PROMETHAZINE HCL 25 MG/ML IJ SOLN: 6.2500 mg | INTRAMUSCULAR | Status: DC | PRN

## 2016-06-30 MED ORDER — LIDOCAINE 2% (20 MG/ML) 5 ML SYRINGE
INTRAMUSCULAR | Status: DC | PRN
  Administered 2016-06-30: 1.5 mg/kg/h via INTRAVENOUS

## 2016-06-30 MED ORDER — FENTANYL CITRATE (PF) 100 MCG/2ML IJ SOLN
INTRAMUSCULAR | Status: DC | PRN
  Administered 2016-06-30: 100 ug via INTRAVENOUS

## 2016-06-30 MED ORDER — LIDOCAINE 2% (20 MG/ML) 5 ML SYRINGE
INTRAMUSCULAR | Status: AC
  Filled 2016-06-30: qty 5

## 2016-06-30 MED ORDER — DEXAMETHASONE SODIUM PHOSPHATE 4 MG/ML IJ SOLN
INTRAMUSCULAR | Status: DC | PRN
  Administered 2016-06-30: 10 mg via INTRAVENOUS

## 2016-06-30 MED ORDER — NITROFURANTOIN MACROCRYSTAL 100 MG PO CAPS
100.0000 mg | ORAL_CAPSULE | Freq: Every day | ORAL | Status: DC
  Administered 2016-06-30: 100 mg via ORAL
  Filled 2016-06-30 (×2): qty 1

## 2016-06-30 MED ORDER — MORPHINE SULFATE (PF) 2 MG/ML IV SOLN
2.0000 mg | INTRAVENOUS | Status: DC | PRN
  Administered 2016-06-30: 2 mg via INTRAVENOUS
  Filled 2016-06-30: qty 1

## 2016-06-30 MED ORDER — BUPIVACAINE HCL (PF) 0.5 % IJ SOLN
INTRAMUSCULAR | Status: DC | PRN
  Administered 2016-06-30: 10 mL

## 2016-06-30 MED ORDER — ROCURONIUM BROMIDE 50 MG/5ML IV SOSY
PREFILLED_SYRINGE | INTRAVENOUS | Status: AC
  Filled 2016-06-30: qty 5

## 2016-06-30 MED ORDER — PANTOPRAZOLE SODIUM 40 MG PO TBEC
40.0000 mg | DELAYED_RELEASE_TABLET | Freq: Every day | ORAL | Status: DC
  Administered 2016-07-01: 40 mg via ORAL
  Filled 2016-06-30: qty 1

## 2016-06-30 MED ORDER — ONDANSETRON HCL 4 MG/2ML IJ SOLN
INTRAMUSCULAR | Status: AC
  Filled 2016-06-30: qty 2

## 2016-06-30 MED ORDER — 0.9 % SODIUM CHLORIDE (POUR BTL) OPTIME
TOPICAL | Status: DC | PRN
  Administered 2016-06-30: 1000 mL

## 2016-06-30 MED ORDER — ALBUTEROL SULFATE HFA 108 (90 BASE) MCG/ACT IN AERS: 2.0000 | INHALATION_SPRAY | Freq: Four times a day (QID) | RESPIRATORY_TRACT | Status: DC | PRN

## 2016-06-30 MED ORDER — KCL IN DEXTROSE-NACL 20-5-0.9 MEQ/L-%-% IV SOLN
INTRAVENOUS | Status: DC
  Administered 2016-06-30 – 2016-07-01 (×2): via INTRAVENOUS
  Filled 2016-06-30 (×2): qty 1000

## 2016-06-30 MED ORDER — SUGAMMADEX SODIUM 500 MG/5ML IV SOLN
INTRAVENOUS | Status: AC
  Filled 2016-06-30: qty 5

## 2016-06-30 MED ORDER — CHLORHEXIDINE GLUCONATE CLOTH 2 % EX PADS: 6.0000 | MEDICATED_PAD | Freq: Once | CUTANEOUS | Status: DC

## 2016-06-30 MED ORDER — MIDAZOLAM HCL 5 MG/5ML IJ SOLN
INTRAMUSCULAR | Status: DC | PRN
  Administered 2016-06-30: 2 mg via INTRAVENOUS

## 2016-06-30 MED ORDER — MIDAZOLAM HCL 2 MG/2ML IJ SOLN
INTRAMUSCULAR | Status: AC
  Filled 2016-06-30: qty 2

## 2016-06-30 MED ORDER — PREGABALIN 75 MG PO CAPS: 200.0000 mg | ORAL_CAPSULE | Freq: Every day | ORAL | Status: DC

## 2016-06-30 MED ORDER — ELETRIPTAN HYDROBROMIDE 40 MG PO TABS
40.0000 mg | ORAL_TABLET | ORAL | Status: DC | PRN
  Filled 2016-06-30: qty 1

## 2016-06-30 MED ORDER — SUGAMMADEX SODIUM 200 MG/2ML IV SOLN
INTRAVENOUS | Status: DC | PRN
  Administered 2016-06-30: 500 mg via INTRAVENOUS

## 2016-06-30 MED ORDER — SPIRONOLACTONE 25 MG PO TABS
25.0000 mg | ORAL_TABLET | Freq: Every day | ORAL | Status: DC
  Administered 2016-07-01: 25 mg via ORAL
  Filled 2016-06-30: qty 1

## 2016-06-30 MED ORDER — METOCLOPRAMIDE HCL 5 MG/ML IJ SOLN
INTRAMUSCULAR | Status: AC
  Filled 2016-06-30: qty 2

## 2016-06-30 MED ORDER — HYDROCODONE-HOMATROPINE 5-1.5 MG/5ML PO SYRP: 5.0000 mL | ORAL_SOLUTION | ORAL | Status: DC | PRN

## 2016-06-30 MED ORDER — ONDANSETRON 4 MG PO TBDP: 4.0000 mg | ORAL_TABLET | Freq: Four times a day (QID) | ORAL | Status: DC | PRN

## 2016-06-30 MED ORDER — KETAMINE HCL 10 MG/ML IJ SOLN
INTRAMUSCULAR | Status: AC
  Filled 2016-06-30: qty 1

## 2016-06-30 MED ORDER — SUCCINYLCHOLINE CHLORIDE 200 MG/10ML IV SOSY
PREFILLED_SYRINGE | INTRAVENOUS | Status: DC | PRN
  Administered 2016-06-30: 140 mg via INTRAVENOUS

## 2016-06-30 MED ORDER — ONDANSETRON HCL 4 MG/2ML IJ SOLN
INTRAMUSCULAR | Status: DC | PRN
  Administered 2016-06-30: 4 mg via INTRAVENOUS

## 2016-06-30 MED ORDER — MEPERIDINE HCL 50 MG/ML IJ SOLN: 6.2500 mg | INTRAMUSCULAR | Status: DC | PRN

## 2016-06-30 MED ORDER — ONDANSETRON HCL 4 MG/2ML IJ SOLN
4.0000 mg | INTRAMUSCULAR | Status: DC | PRN
  Administered 2016-06-30: 4 mg via INTRAVENOUS
  Filled 2016-06-30: qty 2

## 2016-06-30 MED ORDER — HYDROMORPHONE HCL 1 MG/ML IJ SOLN: 0.2500 mg | INTRAMUSCULAR | Status: DC | PRN

## 2016-06-30 MED ORDER — LIDOCAINE 2% (20 MG/ML) 5 ML SYRINGE
INTRAMUSCULAR | Status: DC | PRN
  Administered 2016-06-30: 50 mg via INTRAVENOUS

## 2016-06-30 MED ORDER — LACTATED RINGERS IV SOLN
INTRAVENOUS | Status: DC | PRN
  Administered 2016-06-30 (×2): via INTRAVENOUS

## 2016-06-30 MED ORDER — PROPOFOL 10 MG/ML IV BOLUS
INTRAVENOUS | Status: AC
  Filled 2016-06-30: qty 20

## 2016-06-30 MED ORDER — ENOXAPARIN SODIUM 40 MG/0.4ML ~~LOC~~ SOLN
40.0000 mg | SUBCUTANEOUS | Status: DC
  Administered 2016-07-01: 40 mg via SUBCUTANEOUS
  Filled 2016-06-30: qty 0.4

## 2016-06-30 MED ORDER — BUPIVACAINE HCL (PF) 0.5 % IJ SOLN
INTRAMUSCULAR | Status: AC
  Filled 2016-06-30: qty 30

## 2016-06-30 MED ORDER — DEXAMETHASONE SODIUM PHOSPHATE 10 MG/ML IJ SOLN
INTRAMUSCULAR | Status: AC
  Filled 2016-06-30: qty 1

## 2016-06-30 MED ORDER — SUCCINYLCHOLINE CHLORIDE 200 MG/10ML IV SOSY
PREFILLED_SYRINGE | INTRAVENOUS | Status: AC
  Filled 2016-06-30: qty 10

## 2016-06-30 MED ORDER — ALBUTEROL SULFATE HFA 108 (90 BASE) MCG/ACT IN AERS
INHALATION_SPRAY | RESPIRATORY_TRACT | Status: DC | PRN
  Administered 2016-06-30: 5 via RESPIRATORY_TRACT

## 2016-06-30 MED ORDER — PREGABALIN 75 MG PO CAPS
200.0000 mg | ORAL_CAPSULE | Freq: Every day | ORAL | Status: DC
  Administered 2016-07-01: 10:00:00 200 mg via ORAL
  Filled 2016-06-30: qty 1

## 2016-06-30 MED ORDER — METOCLOPRAMIDE HCL 5 MG/ML IJ SOLN
INTRAMUSCULAR | Status: DC | PRN
  Administered 2016-06-30: 10 mg via INTRAVENOUS

## 2016-06-30 MED ORDER — AMLODIPINE BESYLATE 5 MG PO TABS
5.0000 mg | ORAL_TABLET | Freq: Every day | ORAL | Status: DC
  Filled 2016-06-30: qty 1

## 2016-06-30 MED ORDER — CEFAZOLIN SODIUM-DEXTROSE 2-4 GM/100ML-% IV SOLN
2.0000 g | Freq: Three times a day (TID) | INTRAVENOUS | Status: AC
  Administered 2016-06-30: 2 g via INTRAVENOUS
  Filled 2016-06-30: qty 100

## 2016-06-30 MED ORDER — FENTANYL CITRATE (PF) 250 MCG/5ML IJ SOLN
INTRAMUSCULAR | Status: AC
  Filled 2016-06-30: qty 5

## 2016-06-30 MED ORDER — ROCURONIUM BROMIDE 50 MG/5ML IV SOSY
PREFILLED_SYRINGE | INTRAVENOUS | Status: DC | PRN
  Administered 2016-06-30: 10 mg via INTRAVENOUS
  Administered 2016-06-30: 50 mg via INTRAVENOUS

## 2016-06-30 SURGICAL SUPPLY — 48 items
APL SKNCLS STERI-STRIP NONHPOA (GAUZE/BANDAGES/DRESSINGS) ×1
BANDAGE ADH SHEER 1  50/CT (GAUZE/BANDAGES/DRESSINGS) ×12 IMPLANT
BENZOIN TINCTURE PRP APPL 2/3 (GAUZE/BANDAGES/DRESSINGS) ×3 IMPLANT
BINDER ABDOMINAL 12 ML 46-62 (SOFTGOODS) ×2 IMPLANT
CABLE HIGH FREQUENCY MONO STRZ (ELECTRODE) ×3 IMPLANT
CHLORAPREP W/TINT 26ML (MISCELLANEOUS) ×4 IMPLANT
CLOSURE WOUND 1/2 X4 (GAUZE/BANDAGES/DRESSINGS) ×2
DECANTER SPIKE VIAL GLASS SM (MISCELLANEOUS) ×3 IMPLANT
DEVICE TROCAR PUNCTURE CLOSURE (ENDOMECHANICALS) ×3 IMPLANT
DISSECTOR BLUNT TIP ENDO 5MM (MISCELLANEOUS) IMPLANT
DRAPE INCISE IOBAN 66X45 STRL (DRAPES) ×3 IMPLANT
DRSG TEGADERM 2-3/8X2-3/4 SM (GAUZE/BANDAGES/DRESSINGS) ×2 IMPLANT
ELECT REM PT RETURN 15FT ADLT (MISCELLANEOUS) ×3 IMPLANT
GAUZE SPONGE 2X2 8PLY STRL LF (GAUZE/BANDAGES/DRESSINGS) ×1 IMPLANT
GLOVE BIO SURGEON STRL SZ 6.5 (GLOVE) ×2 IMPLANT
GLOVE BIO SURGEONS STRL SZ 6.5 (GLOVE) ×2
GLOVE BIOGEL PI IND STRL 6.5 (GLOVE) IMPLANT
GLOVE BIOGEL PI IND STRL 7.0 (GLOVE) IMPLANT
GLOVE BIOGEL PI INDICATOR 6.5 (GLOVE) ×4
GLOVE BIOGEL PI INDICATOR 7.0 (GLOVE) ×2
GLOVE ECLIPSE 8.0 STRL XLNG CF (GLOVE) ×3 IMPLANT
GLOVE INDICATOR 8.0 STRL GRN (GLOVE) ×3 IMPLANT
GOWN STRL REUS W/TWL LRG LVL3 (GOWN DISPOSABLE) ×2 IMPLANT
GOWN STRL REUS W/TWL XL LVL3 (GOWN DISPOSABLE) ×7 IMPLANT
IRRIG SUCT STRYKERFLOW 2 WTIP (MISCELLANEOUS)
IRRIGATION SUCT STRKRFLW 2 WTP (MISCELLANEOUS) IMPLANT
KIT BASIN OR (CUSTOM PROCEDURE TRAY) ×3 IMPLANT
MARKER SKIN DUAL TIP RULER LAB (MISCELLANEOUS) ×3 IMPLANT
MESH VENTRALIGHT ST 6X8 (Mesh General) ×2 IMPLANT
MESH VENTRALIGHT ST 6X8 (Mesh Specialty) ×3 IMPLANT
MESH VENTRLGHT ELLIPSE 8X6XMFL (Mesh Specialty) IMPLANT
NDL SPNL 22GX3.5 QUINCKE BK (NEEDLE) ×1 IMPLANT
NEEDLE SPNL 22GX3.5 QUINCKE BK (NEEDLE) ×3 IMPLANT
SCISSORS LAP 5X35 DISP (ENDOMECHANICALS) ×3 IMPLANT
SHEARS HARMONIC ACE PLUS 36CM (ENDOMECHANICALS) IMPLANT
SLEEVE XCEL OPT CAN 5 100 (ENDOMECHANICALS) ×3 IMPLANT
SPONGE GAUZE 2X2 STER 10/PKG (GAUZE/BANDAGES/DRESSINGS) ×2
STRIP CLOSURE SKIN 1/2X4 (GAUZE/BANDAGES/DRESSINGS) ×3 IMPLANT
SUT MNCRL AB 4-0 PS2 18 (SUTURE) ×3 IMPLANT
SUT NOVA NAB DX-16 0-1 5-0 T12 (SUTURE) ×3 IMPLANT
TACKER 5MM HERNIA 3.5CML NAB (ENDOMECHANICALS) ×3 IMPLANT
TOWEL OR 17X26 10 PK STRL BLUE (TOWEL DISPOSABLE) ×3 IMPLANT
TOWEL OR NON WOVEN STRL DISP B (DISPOSABLE) ×3 IMPLANT
TRAY LAPAROSCOPIC (CUSTOM PROCEDURE TRAY) ×3 IMPLANT
TROCAR BLADELESS OPT 5 100 (ENDOMECHANICALS) ×3 IMPLANT
TROCAR XCEL BLUNT TIP 100MML (ENDOMECHANICALS) IMPLANT
TROCAR XCEL NON-BLD 11X100MML (ENDOMECHANICALS) IMPLANT
TUBING INSUF HEATED (TUBING) ×3 IMPLANT

## 2016-06-30 NOTE — Interval H&P Note (Signed)
History and Physical Interval Note:  06/30/2016 7:31 AM  Heather Frederick  has presented today for surgery, with the diagnosis of INCISIONAL HERNIA  The various methods of treatment have been discussed with the patient and family. After consideration of risks, benefits and other options for treatment, the patient has consented to  Procedure(s): [place] (N/A) INSERTION OF MESH (N/A) as a surgical intervention .  The patient's history has been reviewed, patient examined, no change in status, stable for surgery.  I have reviewed the patient's chart and labs.  Questions were answered to the patient's satisfaction.     Nikyah Lackman [name]

## 2016-06-30 NOTE — Transfer of Care (Signed)
Immediate Anesthesia Transfer of Care Note  Patient: Heather Frederick  Procedure(s) Performed: Procedure(s): LAPAROSCOPIC REPAIR OF INCISIONAL HERNIA WITH MESH (N/A) INSERTION OF MESH (N/A)  Patient Location: PACU  Anesthesia Type:General  Level of Consciousness: Patient easily awoken, sedated, comfortable, cooperative, following commands, responds to stimulation.   Airway & Oxygen Therapy: Patient spontaneously breathing, ventilating well, oxygen via simple oxygen mask.  Post-op Assessment: Report given to PACU RN, vital signs reviewed and stable, moving all extremities.   Post vital signs: Reviewed and stable.  Complications: No apparent anesthesia complications  Last Vitals:  Vitals:   06/30/16 0539  BP: 125/65  Pulse: 77  Resp: 18  Temp: 37.3 C    Last Pain:  Vitals:   06/30/16 0559  TempSrc:   PainSc: 4       Patients Stated Pain Goal: 4 (11/07/86 7195)  Complications: No apparent anesthesia complications

## 2016-06-30 NOTE — Interval H&P Note (Signed)
History and Physical Interval Note:  06/30/2016 7:30 AM  Heather Frederick  has presented today for surgery, with the diagnosis of INCISIONAL HERNIA  The various methods of treatment have been discussed with the patient and family. After consideration of risks, benefits and other options for treatment, the patient has consented to  Procedure(s): [place] (N/A) INSERTION OF MESH (N/A) as a surgical intervention .  The patient's history has been reviewed, patient examined, no change in status, stable for surgery.  I have reviewed the patient's chart and labs.  Questions were answered to the patient's satisfaction.     [name] [name]

## 2016-06-30 NOTE — Anesthesia Procedure Notes (Signed)
Procedure Name: Intubation Date/Time: 06/30/2016 7:48 AM Performed by: Deliah Boston Pre-anesthesia Checklist: Patient identified, Emergency Drugs available, Suction available and Patient being monitored Patient Re-evaluated:Patient Re-evaluated prior to inductionOxygen Delivery Method: Circle system utilized Preoxygenation: Pre-oxygenation with 100% oxygen Intubation Type: IV induction Ventilation: Mask ventilation without difficulty Laryngoscope Size: Glidescope and 3 Grade View: Grade I Tube type: Oral Tube size: 7.0 mm Number of attempts: 1 Airway Equipment and Method: Stylet,  Oral airway and Video-laryngoscopy Placement Confirmation: ETT inserted through vocal cords under direct vision,  positive ETCO2 and breath sounds checked- equal and bilateral Secured at: 22 cm Tube secured with: Tape Dental Injury: Teeth and Oropharynx as per pre-operative assessment and Injury to lip  Difficulty Due To: Difficulty was anticipated and Difficult Airway- due to limited oral opening Comments: Small nick to upper lip

## 2016-06-30 NOTE — Anesthesia Postprocedure Evaluation (Addendum)
Anesthesia Post Note  Patient: Heather Frederick  Procedure(s) Performed: Procedure(s) (LRB): LAPAROSCOPIC REPAIR OF INCISIONAL HERNIA WITH MESH (N/A) INSERTION OF MESH (N/A)  Patient location during evaluation: PACU Anesthesia Type: General Level of consciousness: awake and alert Pain management: pain level controlled Vital Signs Assessment: post-procedure vital signs reviewed and stable Respiratory status: spontaneous breathing, nonlabored ventilation, respiratory function stable and patient connected to nasal cannula oxygen Cardiovascular status: blood pressure returned to baseline and stable Postop Assessment: no signs of nausea or vomiting Anesthetic complications: no       Last Vitals:  Vitals:   06/30/16 1021 06/30/16 1055  BP: 115/66 (!) 113/43  Pulse: 87 92  Resp: 18 16  Temp: 36.6 C 36.6 C    Last Pain:  Vitals:   06/30/16 1021  TempSrc:   PainSc: [name]

## 2016-06-30 NOTE — H&P (Signed)
Heather Frederick is an 61 y.o. female.   Chief Complaint:  Here for elective surgery HPI:  This is a 61 year old female with an enlarging symptomatic umbilical incisional hernia. She underwent a laparoscopic cholecystectomy many years ago.  He still has some occasional right upper quadrant pain. She has a progressively increasing bulge around the umbilicus that is increasingly symptomatic. Now presents for elective repair. Of note was that she was in the hospital in February because of severe viral gastroenteritis. She currently denies any upper respiratory infection symptoms nausea vomiting or diarrhea.  Past Medical History:  Diagnosis Date  . Allergy   . Anxiety   . Arthritis   . Benign essential HTN 09/23/2014  . Chronic kidney disease    uti  . Depression   . Dyslipidemia   . Elevated liver enzymes   . Family history of anesthesia complication    father has a severe hard time waking up  . Gallstones    a. Seen on CT 01/2014.  Marland Kitchen GERD (gastroesophageal reflux disease)   . Hard of hearing   . Hepatic steatosis   . History of frequent urinary tract infections   . Hyperlipidemia   . Hypertension   . Lateral epicondylitis of right elbow   . Mental disorder   . Meralgia paresthetica of right side 10/02/2012   slight at 05/2014  . Migraine headache   . Obesity   . OSA (obstructive sleep apnea)    severe with AHI 37/hr now on CPAP at 18cm H2O  . Osteoarthritis   . Pneumonia    Feb 2018  . Raynaud disease    in feet per patient   . Sleep apnea    wears c-pap  . Urinary tract infection     Past Surgical History:  Procedure Laterality Date  . ABDOMINAL HYSTERECTOMY  1/04   partial  . BLADDER SUSPENSION  6/10  . CARDIAC CATHETERIZATION    . carpel tunnel left/right  7/08, 8/08 Bilateral 8/08 and 7/08  . carpel tunnel rel Right 4/12  . CHOLECYSTECTOMY N/A 06/11/2014   Procedure: LAPAROSCOPIC CHOLECYSTECTOMY WITH ATTEMPTED INTRAOPERATIVE CHOLANGIOGRAM;  Surgeon: Jackolyn Confer,  MD;  Location: [facility];  Service: General;  Laterality: N/A;  . COLONOSCOPY    . ERCP N/A 10/19/2014   Procedure: ENDOSCOPIC RETROGRADE CHOLANGIOPANCREATOGRAPHY (ERCP);  Surgeon: Ladene Artist, MD;  Location: [facility];  Service: Endoscopy;  Laterality: N/A;  . JOINT REPLACEMENT    . KNEE ARTHROSCOPY Left 12/12  . KNEE ARTHROSCOPY Right 12/06  . LEFT HEART CATHETERIZATION WITH CORONARY ANGIOGRAM N/A 03/10/2014   Procedure: LEFT HEART CATHETERIZATION WITH CORONARY ANGIOGRAM;  Surgeon: Sinclair Grooms, MD;  Location: [facility];  Service: Cardiovascular;  Laterality: N/A;  . PLANTAR FASCIA RELEASE Right 12/10  . radial tunnel release     right arm   . ROTATOR CUFF REPAIR Left 6/11  . tennis elbow release Right 7/04  . TOTAL KNEE ARTHROPLASTY Left 09/10/2012   Procedure: TOTAL KNEE ARTHROPLASTY- left;  Surgeon: Garald Balding, MD;  Location: [facility];  Service: Orthopedics;  Laterality: Left;  Left total knee arthroplasty    Family History  Problem Relation Age of Onset  . Hypertension Mother   . Stroke Mother   . Liver disease Mother     Abcess  . Hypertension Father   . Coronary artery disease Father   . Migraines Father   . Clotting disorder Father   . Kidney failure Brother   . Hypertension Brother   .  Migraines Brother   . Migraines Daughter   . Breast cancer Other     Niece with breast cancer  . Colon cancer Paternal Grandmother   . Pancreatic cancer Paternal Grandmother   . Stomach cancer Paternal Grandmother   . Breast cancer Cousin   . Esophageal cancer Neg Hx   . Rectal cancer Neg Hx    Social History:  reports that she has never smoked. She has never used smokeless tobacco. She reports that she drinks alcohol. She reports that she does not use drugs.  Allergies:  Allergies  Allergen Reactions  . Topamax [Topiramate] Other (See Comments)    :Stroke like symptoms  . Aleve [Naproxen Sodium] Hives    Has tolerated Voltaren topical as well as aspirin.  . Bee  Venom Swelling  . Echinacea Hives  . Other Other (See Comments)    Feathers cause sinus congestion  . Sulfa Antibiotics Hives  . Advil [Ibuprofen] Hives    Has tolerated Voltaren topical as well as aspirin.    Medications Prior to Admission  Medication Sig Dispense Refill  . acetaminophen (TYLENOL) 650 MG CR tablet Take 1,300 mg by mouth every 8 (eight) hours as needed for pain.    Marland Kitchen albuterol (PROVENTIL HFA;VENTOLIN HFA) 108 (90 Base) MCG/ACT inhaler Inhale 2 puffs into the lungs every 6 (six) hours as needed for wheezing or shortness of breath. (Patient not taking: Reported on 06/26/2016) 1 Inhaler 0  . amLODipine (NORVASC) 5 MG tablet Take 5 mg by mouth daily.     Marland Kitchen aspirin EC 81 MG tablet Take 81 mg by mouth daily.    . Biotin 2500 MCG CAPS Take by mouth.    . cholestyramine (QUESTRAN) 4 g packet Take 4 g by mouth daily.     . citalopram (CELEXA) 20 MG tablet TAKE ONE (1) TABLET BY MOUTH EVERY DAY 30 tablet 3  . Cranberry-Vitamin C-Vitamin E (CRANBERRY PLUS VITAMIN C PO) Take 1 tablet by mouth daily. 4200 mg    . Cyanocobalamin (HM SUPER VITAMIN B12) 2500 MCG CHEW Chew 2,500 mg by mouth daily.    . diclofenac sodium (VOLTAREN) 1 % GEL Apply 1 g topically 4 (four) times daily as needed (for pain).    Marland Kitchen eletriptan (RELPAX) 40 MG tablet Take 1 tablet (40 mg total) by mouth every 2 (two) hours as needed for migraine. 8 tablet 11  . Lactobacillus Rhamnosus, GG, (CULTURELLE PO) Take 1 capsule by mouth daily.    Marland Kitchen loratadine (CLARITIN) 10 MG tablet Take 10-20 mg by mouth 2 (two) times daily as needed for allergies.     . Multiple Vitamin (MULTIVITAMIN WITH MINERALS) TABS Take 1 tablet by mouth daily.     . Multiple Vitamins-Minerals (OCUVITE ADULT 50+ PO) Take 1 tablet by mouth daily.    . nitrofurantoin (MACRODANTIN) 100 MG capsule Take 100 mg by mouth daily.     . Omega-3 Fatty Acids (FISH OIL PO) Take 1,200 mg by mouth every morning.     Marland Kitchen oxybutynin (DITROPAN-XL) 10 MG 24 hr tablet Take 10  mg by mouth every morning.     . pantoprazole (PROTONIX) 40 MG tablet TAKE ONE (1) TABLET BY MOUTH TWO (2) TIMES DAILY 60 tablet 5  . pregabalin (LYRICA) 200 MG capsule TAKE ONE (1) CAPSULE BY MOUTH EVERY MORNING 30 capsule 5  . ranitidine (ZANTAC) 300 MG tablet TAKE ONE TABLET BY MOUTH EVERY EVENING 30 tablet 11  . simvastatin (ZOCOR) 20 MG tablet TAKE ONE (1) TABLET  BY MOUTH EVERY DAY 90 tablet 3  . spironolactone (ALDACTONE) 25 MG tablet TAKE ONE (1) TABLET BY MOUTH EVERY DAY 90 tablet 3  . HYDROcodone-homatropine (HYCODAN) 5-1.5 MG/5ML syrup Take 5 mLs by mouth every 8 (eight) hours as needed for cough. (Patient not taking: Reported on 06/22/2016) 120 mL 0  . nitroGLYCERIN (NITROSTAT) 0.4 MG SL tablet Place 1 tablet (0.4 mg total) under the tongue every 5 (five) minutes as needed for chest pain. 25 tablet 5  . ondansetron (ZOFRAN) 8 MG tablet Take 1 tablet (8 mg total) by mouth every 6 (six) hours. (Patient not taking: Reported on 05/30/2016) 40 tablet 0    No results found for this or any previous visit (from the past 48 hour(s)). No results found.  Review of Systems  Constitutional: Negative for chills and fever.  HENT: Negative for sore throat.   Respiratory: Negative for cough.   Gastrointestinal: Negative for diarrhea, nausea and vomiting.    Blood pressure 125/65, pulse 77, temperature 99.1 F (37.3 C), temperature source Oral, resp. rate 18, height 5\' 5"  (1.651 m), weight 132.5 kg (292 lb), SpO2 98 %. Physical Exam  Constitutional: No distress.  Morbidly obese female  Cardiovascular: Normal rate and regular rhythm.   Respiratory: Effort normal and breath sounds normal.  GI: Soft.  Obese. Moderate sized umbilical bulge is present. Small upper abdominal scars present.  Musculoskeletal: She exhibits no edema.  Skin: Skin is warm and dry.  Psychiatric: She has a normal mood and affect. Her behavior is normal.     Assessment/Plan Enlarging and symptomatic umbilical incisional  hernia.  Plan: Laparoscopic repair with mesh.  Odis Hollingshead, MD 06/30/2016, 7:28 AM

## 2016-06-30 NOTE — H&P (View-Only) (Signed)
[facility]  PATIENT: Heather Frederick DOB: November 21, 1955   REASON FOR VISIT: follow up for Migraine, meralgia paresthetica right side HISTORY FROM: Patient    HISTORY OF PRESENT ILLNESS:Heather Frederick is a 61 year old right-handed white female with a history of morbid obesity and a history of migraine headache. She also has a history of obstructive sleep apnea.  and is on CPAPat night. The patient has done well with her migraines.She is  retired from her job. The patient has had 4 headaches/migraines in the last 6 months.  The patient is  on Toprol, which she is currently taking. The patient is on Relpax if needed acutely for the headache. The patient indicates that if she can get to the headache quickly, the headache will only last about 15-20 minutes with the Relpax.  She is on Lyrica for her meralgia paresthetica with control of her pain.  She is due to have hernia surgery this Friday . She was hospitalized in February for the flu. She returns for an evaluation.   REVIEW OF SYSTEMS: Full 14 system review of systems performed and notable only for those listed, all others are neg:  Constitutional: neg  Cardiovascular: neg Ear/Nose/Throat: Hard of hearing and does not wear hearing aids Skin: neg Eyes: neg Respiratory: neg Gastroitestinal: Abdominal pain  Hematology/Lymphatic: neg  Endocrine: neg Musculoskeletal:nDegenerative disc disease ALLERGIES/Immunology: neg Neurological: history of headaches in good control Psychiatric:   Sleep :  obstructive sleep apnea with CPAP    ALLERGIES: Allergies  Allergen Reactions  . Topamax [Topiramate] Other (See Comments)    :Stroke like symptoms  . Aleve [Naproxen Sodium] Hives    Has tolerated Voltaren topical as well as aspirin.  . Bee Venom Swelling  . Echinacea Hives  . Other Other (See Comments)    Feathers cause sinus congestion  . Sulfa Antibiotics Hives  . Advil [Ibuprofen] Hives    Has tolerated Voltaren topical as  well as aspirin.    HOME MEDICATIONS: Outpatient Medications Prior to Visit  Medication Sig Dispense Refill  . acetaminophen (TYLENOL) 650 MG CR tablet Take 1,300 mg by mouth every 8 (eight) hours as needed for pain.    Marland Kitchen amLODipine (NORVASC) 5 MG tablet Take 5 mg by mouth daily.     Marland Kitchen aspirin EC 81 MG tablet Take 81 mg by mouth daily.    . Biotin 2500 MCG CAPS Take by mouth.    . cholestyramine (QUESTRAN) 4 g packet Take 4 g by mouth daily.     . citalopram (CELEXA) 20 MG tablet TAKE ONE (1) TABLET BY MOUTH EVERY DAY 30 tablet 3  . Cranberry-Vitamin C-Vitamin E (CRANBERRY PLUS VITAMIN C PO) Take 1 tablet by mouth daily. 4200 mg    . Cyanocobalamin (HM SUPER VITAMIN B12) 2500 MCG CHEW Chew 2,500 mg by mouth daily.    . diclofenac sodium (VOLTAREN) 1 % GEL Apply 1 g topically 4 (four) times daily as needed (for pain).    Marland Kitchen eletriptan (RELPAX) 40 MG tablet Take 1 tablet (40 mg total) by mouth every 2 (two) hours as needed for migraine. 8 tablet 8  . Lactobacillus Rhamnosus, GG, (CULTURELLE PO) Take 1 capsule by mouth daily.    Marland Kitchen loratadine (CLARITIN) 10 MG tablet Take 10-20 mg by mouth 2 (two) times daily as needed for allergies.     Marland Kitchen LYRICA 200 MG capsule TAKE ONE (1) CAPSULE BY MOUTH EVERY MORNING 30 capsule 0  . Multiple Vitamin (MULTIVITAMIN WITH MINERALS) TABS Take 1  tablet by mouth daily.     . Multiple Vitamins-Minerals (OCUVITE ADULT 50+ PO) Take 1 tablet by mouth daily.    . nitrofurantoin (MACRODANTIN) 100 MG capsule Take 100 mg by mouth daily.     . nitroGLYCERIN (NITROSTAT) 0.4 MG SL tablet Place 1 tablet (0.4 mg total) under the tongue every 5 (five) minutes as needed for chest pain. 25 tablet 5  . Omega-3 Fatty Acids (FISH OIL PO) Take 1,200 mg by mouth every morning.     Marland Kitchen oxybutynin (DITROPAN-XL) 10 MG 24 hr tablet Take 10 mg by mouth every morning.     . pantoprazole (PROTONIX) 40 MG tablet TAKE ONE (1) TABLET BY MOUTH TWO (2) TIMES DAILY 60 tablet 5  . ranitidine (ZANTAC)  300 MG tablet TAKE ONE TABLET BY MOUTH EVERY EVENING 30 tablet 11  . simvastatin (ZOCOR) 20 MG tablet TAKE ONE (1) TABLET BY MOUTH EVERY DAY 90 tablet 3  . spironolactone (ALDACTONE) 25 MG tablet TAKE ONE (1) TABLET BY MOUTH EVERY DAY 90 tablet 3  . albuterol (PROVENTIL HFA;VENTOLIN HFA) 108 (90 Base) MCG/ACT inhaler Inhale 2 puffs into the lungs every 6 (six) hours as needed for wheezing or shortness of breath. (Patient not taking: Reported on 06/26/2016) 1 Inhaler 0  . HYDROcodone-homatropine (HYCODAN) 5-1.5 MG/5ML syrup Take 5 mLs by mouth every 8 (eight) hours as needed for cough. (Patient not taking: Reported on 06/22/2016) 120 mL 0  . ondansetron (ZOFRAN) 8 MG tablet Take 1 tablet (8 mg total) by mouth every 6 (six) hours. (Patient not taking: Reported on 05/30/2016) 40 tablet 0   No facility-administered medications prior to visit.     PAST MEDICAL HISTORY: Past Medical History:  Diagnosis Date  . Allergy   . Anxiety   . Arthritis   . Benign essential HTN 09/23/2014  . Chronic kidney disease    uti  . Depression   . Dyslipidemia   . Elevated liver enzymes   . Family history of anesthesia complication    father has a severe hard time waking up  . Gallstones    a. Seen on CT 01/2014.  Marland Kitchen GERD (gastroesophageal reflux disease)   . Hard of hearing   . Hepatic steatosis   . History of frequent urinary tract infections   . Hyperlipidemia   . Hypertension   . Lateral epicondylitis of right elbow   . Mental disorder   . Meralgia paresthetica of right side 10/02/2012   slight at 05/2014  . Migraine headache   . Obesity   . OSA (obstructive sleep apnea)    severe with AHI 37/hr now on CPAP at 18cm H2O  . Osteoarthritis   . Raynaud disease    in feet per patient   . Sleep apnea    wears c-pap  . Urinary tract infection     PAST SURGICAL HISTORY: Past Surgical History:  Procedure Laterality Date  . ABDOMINAL HYSTERECTOMY  1/04   partial  . BLADDER SUSPENSION  6/10  . CARDIAC  CATHETERIZATION    . carpel tunnel left/right  7/08, 8/08 Bilateral 8/08 and 7/08  . carpel tunnel rel Right 4/12  . CHOLECYSTECTOMY N/A 06/11/2014   Procedure: LAPAROSCOPIC CHOLECYSTECTOMY WITH ATTEMPTED INTRAOPERATIVE CHOLANGIOGRAM;  Surgeon: Jackolyn Confer, MD;  Location: [facility];  Service: General;  Laterality: N/A;  . COLONOSCOPY    . ERCP N/A 10/19/2014   Procedure: ENDOSCOPIC RETROGRADE CHOLANGIOPANCREATOGRAPHY (ERCP);  Surgeon: Ladene Artist, MD;  Location: [facility];  Service: Endoscopy;  Laterality:  N/A;  . JOINT REPLACEMENT    . KNEE ARTHROSCOPY Left 12/12  . KNEE ARTHROSCOPY Right 12/06  . LEFT HEART CATHETERIZATION WITH CORONARY ANGIOGRAM N/A 03/10/2014   Procedure: LEFT HEART CATHETERIZATION WITH CORONARY ANGIOGRAM;  Surgeon: Sinclair Grooms, MD;  Location: [facility];  Service: Cardiovascular;  Laterality: N/A;  . PLANTAR FASCIA RELEASE Right 12/10  . radial tunnel release     right arm   . ROTATOR CUFF REPAIR Left 6/11  . tennis elbow release Right 7/04  . TOTAL KNEE ARTHROPLASTY Left 09/10/2012   Procedure: TOTAL KNEE ARTHROPLASTY- left;  Surgeon: Garald Balding, MD;  Location: [place];  Service: Orthopedics;  Laterality: Left;  Left total knee arthroplasty    FAMILY HISTORY: Family History  Problem Relation Age of Onset  . Hypertension Mother   . Stroke Mother   . Liver disease Mother     Abcess  . Hypertension Father   . Coronary artery disease Father   . Migraines Father   . Clotting disorder Father   . Kidney failure Brother   . Hypertension Brother   . Migraines Brother   . Migraines Daughter   . Breast cancer Other     Niece with breast cancer  . Colon cancer Paternal Grandmother   . Pancreatic cancer Paternal Grandmother   . Stomach cancer Paternal Grandmother   . Breast cancer Cousin   . Esophageal cancer Neg Hx   . Rectal cancer Neg Hx     SOCIAL HISTORY: Social History   Social History  . Marital status: Married    Spouse name:  Remo Lipps  . Number of children: 2  . Years of education: 16   Occupational History  . [place] History Main Topics  . Smoking status: Never Smoker  . Smokeless tobacco: Never Used     Comment: Secondhand - from family growing up, workplace intermittently  . Alcohol use 0.0 oz/week     Comment: occasionally - intermittent, no more than twice a week  . Drug use: No  . Sexual activity: Not on file   Other Topics Concern  . Not on file   Social History Narrative   Patient is married Remo Lipps) and lives at home with her husband and child.   Patient has two children.   Patient has a college education.   Patient is right-handed.   Patient drinks 1-2 cup of coffee/tea daily.     PHYSICAL EXAM  Vitals:   06/26/16 0835  BP: 106/62  Pulse: 70  Weight: 293 lb 12.8 oz (133.3 kg)   Body mass index is 48.89 kg/m.  Generalized: Well developed, morbidly obese female in no acute distress  Head: normocephalic and atraumatic,. Oropharynx benign  Neck: Supple, no carotid bruits  Cardiac: Regular rate rhythm, no murmur  Musculoskeletal: No deformity   Neurological examination   Mentation: Alert oriented to time, place, history taking. Attention span and concentration appropriate. Recent and remote memory intact.  Follows all commands speech and language fluent.   Cranial nerve II-XII: Fundoscopic exam reveals sharp disc margins.Pupils were equal round reactive to light extraocular movements were full, visual field were full on confrontational test. Facial sensation and strength were normal. Very hard of hearing . Uvula tongue midline. head turning and shoulder shrug were normal and symmetric.Tongue protrusion into cheek strength was normal. Motor: normal bulk and tone, full strength in the BUE, BLE,  Sensory: normal and symmetric to light touch, on the face  arms and legs Coordination: finger-nose-finger, heel-to-shin bilaterally, no dysmetria Reflexes:  Symmetric upper and lower plantar responses were flexor bilaterally. Gait and Station: Rising up from seated position without assistance, wide based stance  moderate stride, good arm swing, smooth turning, able to perform tiptoe, and heel walking without difficulty. Tandem gait is steady     DIAGNOSTIC DATA (LABS, IMAGING, TESTING) - I reviewed patient records, labs, notes, testing and imaging myself where available.  Lab Results  Component Value Date   WBC 4.5 05/07/2016   HGB 13.4 05/07/2016   HCT 39.8 05/07/2016   MCV 87.3 05/07/2016   PLT 133 (L) 05/07/2016      Component Value Date/Time   NA 141 05/22/2016 1116   K 4.4 05/22/2016 1116   CL 102 05/22/2016 1116   CO2 30 05/22/2016 1116   GLUCOSE 99 05/22/2016 1116   BUN 11 05/22/2016 1116   CREATININE 0.94 05/22/2016 1116   CALCIUM 9.9 05/22/2016 1116   PROT 7.2 05/22/2016 1116   ALBUMIN 4.1 05/22/2016 1116   AST 14 05/22/2016 1116   ALT 18 05/22/2016 1116   ALKPHOS 76 05/22/2016 1116   BILITOT 0.6 05/22/2016 1116   GFRNONAA >60 05/07/2016 0535   GFRAA >60 05/07/2016 0535     ASSESSMENT AND PLAN  61 y.o. year old female  has a past medical history of Anxiety;  Arthritis;  Migraine headache; Obesity; Depression;  Meralgia paresthetica of right side (10/02/2012); Benign essential HTN (09/23/2014);  and OSA (obstructive sleep apnea). Here to follow-up for her migraines and meralgia paresthetica.Recent labs reviewed from February 2018  Continue Lyrica 200 mg daily will refill Continue Relpax acutely will refill  Continue Toprol daily refilled by Dr. Tamala Julian Follow-up in 1 year Call for increase in headaches, keep a  record Dennie Bible, [facility], [facility], [facility] [address] [place], [place] [address] [phone]

## 2016-06-30 NOTE — Discharge Instructions (Signed)
[facility] _______[facility]   VENTRAL (ABDOMINAL WALL) HERNIA REPAIR: POST OP INSTRUCTIONS  Always review your discharge instruction sheet given to you by the facility where your surgery was performed. IF YOU HAVE DISABILITY OR FAMILY LEAVE FORMS, YOU MUST BRING THEM TO THE OFFICE FOR PROCESSING.   DO NOT GIVE THEM TO YOUR DOCTOR.  1. A  prescription for pain medication may be given to you upon discharge.  Take your pain medication as prescribed, if needed.  If narcotic pain medicine is not needed, then you may take acetaminophen (Tylenol) or ibuprofen (Advil) as needed. 2. Take your usually prescribed medications unless otherwise directed. 3. If you need a refill on your pain medication, please contact your pharmacy.  They will contact our office to request authorization. Prescriptions will not be filled after 5 pm or on week-ends. 4. You should follow a light diet the first 24 hours after arrival home, such as soup and crackers, etc.  Be sure to include lots of fluids daily.  Resume your normal diet the day after surgery. 5. Most patients will experience some swelling and bruising around the hernia repair site.  Ice packs and reclining will help.  Swelling and bruising can take many days to resolve.  6. It is common to experience some constipation if taking pain medication after surgery.  Increasing fluid intake and taking a stool softener (such as Colace) will usually help or prevent this problem from occurring.  A mild laxative (Milk of Magnesia or Miralax) should be taken according to package directions if there are no bowel movements after 48 hours. 7. Unless discharge instructions indicate otherwise, you may remove your bandages 72 hours after surgery, and you may shower at that time.  You may have steri-strips (small skin tapes) in place directly over the incision.  These strips should be left on the skin.  If your surgeon used skin glue on the incision, you may shower in 24 hours.   The glue will flake off over the next 2-3 weeks.  Any sutures or staples will be removed at the office during your follow-up visit. 8. ACTIVITIES:  You may resume regular (light) daily activities beginning the next day--such as daily self-care, walking, climbing stairs--gradually increasing activities as tolerated.  You may have sexual intercourse when it is comfortable.  Refrain from any heavy lifting or straining-nothing over 10 pounds for 6 weeks.  a. You may drive when you are no longer taking prescription pain medication, you can comfortably wear a seatbelt, and you can safely maneuver your car and apply brakes. b. RETURN TO WORK:  Desk work/Light work in 1-2 weeks, full duty in 6 weeks._________________________________________________________ 9. You should see your doctor in the office for a follow-up appointment approximately 2-3 weeks after your surgery.  Make sure that you call for this appointment within a day or two after you arrive home to insure a convenient appointment time. 10. OTHER INSTRUCTIONS:  ___Wear abdominal binder._______________________________________________________________________________________________________________________________________________________________________________________  WHEN TO CALL YOUR DOCTOR: 1. Fever over 101.0 2. Inability to urinate 3. Nausea and/or vomiting 4. Extreme swelling or bruising 5. Continued bleeding from incision. 6. Increased pain, redness, or drainage from the incision  The clinic staff is available to answer your questions during regular business hours.  Please dont hesitate to call and ask to speak to one of the nurses for clinical concerns.  If you have a medical emergency, go to the nearest emergency room or call 911.  A surgeon from [facility] is  always on call at the hospital   [address] [place], [place], [place]  [address] ?  [address], [place], [place]   [address] [phone] ? [phone]  ? FAX [phone] Web site: [email or web address]

## 2016-06-30 NOTE — Op Note (Signed)
OPERATIVE NOTE- INCISIONAL HERNIA REPAIR  PREOPERATIVE DX:  Ventral incisional hernia  POSTOPERATIVE DX:  Incarcerated ventral incisional hernia  PROCEDURE:   Laparoscopic repair of incarcerated ventral hernia with mesh Lily Lovings)        Surgeon: Odis Hollingshead   Assistants: none  Anesthesia: General endotracheal anesthesia  Indications:   This is a 61 year old morbidly obese female who is developed a incisional hernia in the periumbilical region following a laparoscopic cholecystectomy. The hernia is becoming larger and is symptomatic and limiting her activities. She now presents for laparoscopic repair.    Procedure Detail:  She was brought to the operating room, placed supine on the operating table, and a general anesthetic was given. An orogastric tube was inserted.  The abdominal wall was widely sterilely prepped and draped. A timeout was performed.  She was placed in slight reverse Trendelenburg position. A 5 mm incision was made in the left subcostal area. Using a 5 mm Optiview trocar and laparoscope, access was gained into the peritoneal cavity. A pneumoperitoneum was created by insufflation of carbon dioxide gas. The laparoscope was introduced and there is no evidence of organ injury or bleeding under the trocar. The periumbilical incisional hernia was identified containing incarcerated omentum that was viable.   A 5 mm trocar was placed in the left lower quadrant.  Using blunt dissection and sharp dissection, the omentum was reduced from the hernia. A second small hernia was noted just superior to this.  The periphery of the hernia was marked and I measured 4 cm away from this. A piece of Ventralite mesh was brought into the field. It was cut into a 14 cm long by 14 cm wide piece which would allow for good coverage of the hernia with adequate overlap. The rough side was marked. Four anchoring sutures of #1 Novofil were placed around the mesh.  The mesh was hydrated. The 5 mm left  lower quadrant trocar was changed to a 11 mm trocar. The mesh was then placed into the peritoneal cavity through the 11 mm trocar. A 5 mm trocar was placed in the right lateral abdomen.  The mesh was deployed with the nonadherent side facing the viscera. Four stab wounds were placed in the abdominal wall to coincide with the anchoring sutures. All anchoring sutures were then brought up across the fascial bridge using a suture passer and then they were tied down anchoring the mesh to the abdominal wall. The mesh was then further anchored to the abdominal wall with an outer circular rim and inner circular rim of spiral tacks. This provided for adequate coverage and good overlap of the hernia defect.  A four-quadrant and central inspection was then performed. There was no evidence of bleeding or organ injury. She was complaining of some pain in the right upper quadrant abdominal wall area and I saw no significant adhesions present there for obvious reason for the intermittent discomfort she's had since her cholecystectomy. The pneumoperitoneum was released and the remaining trocars were removed.  All skin incisions were closed with 4-0 Monocryl subcuticular stitches followed by Steri-Strips and sterile dressings.  She tolerated the procedure well, without any apparent complications, and was taken to the recovery room in satisfactory condition.  Estimated Blood Loss:  Less than 100 cc         Drains: none        Complications:  * No complications entered in OR log *         Disposition: PACU - hemodynamically stable.  Condition: stable

## 2016-07-01 MED ORDER — HYDROCODONE-ACETAMINOPHEN 5-325 MG PO TABS: 1.0000 | ORAL_TABLET | Freq: Four times a day (QID) | ORAL | 0 refills | Status: DC | PRN

## 2016-07-01 NOTE — Discharge Summary (Signed)
Physician Discharge Summary  Patient ID:  Heather Frederick  MRN: [id number]  DOB/AGE: July 14, 1955 61 y.o.  Admit date: 06/30/2016 Discharge date: 07/01/2016  Discharge Diagnoses:   Active Problems:   Incisional hernia Morbid obesity OSA - on CPAP  Operation: Procedure(s): LAPAROSCOPIC REPAIR OF INCISIONAL HERNIA WITH MESH INSERTION OF MESH on 06/30/2016 - [facility]  Discharged Condition: good  Hospital Course: Heather Frederick is an 61 y.o. female whose primary care physician is Hoyt Koch, MD and who was admitted 06/30/2016 with a chief complaint of incarcerated ventral incision hernia.   She was brought to the operating room on 06/30/2016 and underwent laparoscopic repair of incarcerated ventral hernia with mesh (Ventralite).   She is now one day post op, taking liquids okay, and ready to go home.  The discharge instructions were reviewed with the patient.  Consults: None  Significant Diagnostic Studies: Results for orders placed or performed during the hospital encounter of 06/27/16  CBC  Result Value Ref Range   WBC 6.1 4.0 - 10.5 K/uL   RBC 4.68 3.87 - 5.11 MIL/uL   Hemoglobin 13.4 12.0 - 15.0 g/dL   HCT 40.1 36.0 - 46.0 %   MCV 85.7 78.0 - 100.0 fL   MCH 28.6 26.0 - 34.0 pg   MCHC 33.4 30.0 - 36.0 g/dL   RDW 14.0 11.5 - 15.5 %   Platelets 173 150 - 400 K/uL  Basic metabolic panel  Result Value Ref Range   Sodium 140 135 - 145 mmol/L   Potassium 4.1 3.5 - 5.1 mmol/L   Chloride 106 101 - 111 mmol/L   CO2 27 22 - 32 mmol/L   Glucose, Bld 132 (H) 65 - 99 mg/dL   BUN 10 6 - 20 mg/dL   Creatinine, Ser 0.83 0.44 - 1.00 mg/dL   Calcium 9.4 8.9 - 10.3 mg/dL   GFR calc non Af Amer >60 >60 mL/min   GFR calc Af Amer >60 >60 mL/min   Anion gap 7 5 - 15    Dg Chest 2 View  Result Date: 06/26/2016 CLINICAL DATA:  Recent pneumonia.  Cough EXAM: CHEST  2 VIEW COMPARISON:  May 22, 2016 FINDINGS: There is no edema or consolidation. There is chronic peribronchial thickening  centrally consistent with chronic bronchitis. Heart size and pulmonary vascularity are normal. No adenopathy. No demonstrable bone lesions. IMPRESSION: No edema or consolidation. Recent left base opacity has resolved. There is chronic central bronchitis. Electronically Signed   By: Lowella Grip III M.D.   On: 06/26/2016 10:26    Discharge Exam:  Vitals:   07/01/16 0001 07/01/16 0532  BP: 102/72 (!) 104/55  Pulse: 98 86  Resp: 18 18  Temp: 97.9 F (36.6 C) 98.5 F (36.9 C)    General: Obese WF who is alert and generally healthy appearing.  Lungs: Clear to auscultation and symmetric breath sounds. Heart:  RRR. No murmur or rub. Abdomen: Soft. No mass.  Normal bowel sounds.  Obese.  Dressings over wounds are clean. Extremities:  Good strength and ROM  in upper and lower extremities.   Discharge Medications:   Allergies as of 07/01/2016      Reactions   Topamax [topiramate] Other (See Comments)   :Stroke like symptoms   Aleve [naproxen Sodium] Hives   Has tolerated Voltaren topical as well as aspirin.   Bee Venom Swelling   Echinacea Hives   Other Other (See Comments)   Feathers cause sinus congestion   Sulfa Antibiotics Hives  Advil [ibuprofen] Hives   Has tolerated Voltaren topical as well as aspirin.      Medication List    TAKE these medications   acetaminophen 650 MG CR tablet Commonly known as:  TYLENOL Take 1,300 mg by mouth every 8 (eight) hours as needed for pain.   albuterol 108 (90 Base) MCG/ACT inhaler Commonly known as:  PROVENTIL HFA;VENTOLIN HFA Inhale 2 puffs into the lungs every 6 (six) hours as needed for wheezing or shortness of breath.   amLODipine 5 MG tablet Commonly known as:  NORVASC Take 5 mg by mouth daily.   aspirin EC 81 MG tablet Take 81 mg by mouth daily.   Biotin 2500 MCG Caps Take by mouth.   cholestyramine 4 g packet Commonly known as:  QUESTRAN Take 4 g by mouth daily.   citalopram 20 MG tablet Commonly known as:   CELEXA TAKE ONE (1) TABLET BY MOUTH EVERY DAY   CRANBERRY PLUS VITAMIN C PO Take 1 tablet by mouth daily. 4200 mg   CULTURELLE PO Take 1 capsule by mouth daily.   diclofenac sodium 1 % Gel Commonly known as:  VOLTAREN Apply 1 g topically 4 (four) times daily as needed (for pain).   eletriptan 40 MG tablet Commonly known as:  RELPAX Take 1 tablet (40 mg total) by mouth every 2 (two) hours as needed for migraine.   FISH OIL PO Take 1,200 mg by mouth every morning.   HM SUPER VITAMIN B12 2500 MCG Chew Generic drug:  Cyanocobalamin Chew 2,500 mg by mouth daily.   HYDROcodone-acetaminophen 5-325 MG tablet Commonly known as:  NORCO/VICODIN Take 1-2 tablets by mouth every 6 (six) hours as needed for moderate pain.   HYDROcodone-homatropine 5-1.5 MG/5ML syrup Commonly known as:  HYCODAN Take 5 mLs by mouth every 8 (eight) hours as needed for cough.   loratadine 10 MG tablet Commonly known as:  CLARITIN Take 10-20 mg by mouth 2 (two) times daily as needed for allergies.   multivitamin with minerals Tabs tablet Take 1 tablet by mouth daily.   nitrofurantoin 100 MG capsule Commonly known as:  MACRODANTIN Take 100 mg by mouth daily.   nitroGLYCERIN 0.4 MG SL tablet Commonly known as:  NITROSTAT Place 1 tablet (0.4 mg total) under the tongue every 5 (five) minutes as needed for chest pain.   OCUVITE ADULT 50+ PO Take 1 tablet by mouth daily.   ondansetron 8 MG tablet Commonly known as:  ZOFRAN Take 1 tablet (8 mg total) by mouth every 6 (six) hours.   oxybutynin 10 MG 24 hr tablet Commonly known as:  DITROPAN-XL Take 10 mg by mouth every morning.   pantoprazole 40 MG tablet Commonly known as:  PROTONIX TAKE ONE (1) TABLET BY MOUTH TWO (2) TIMES DAILY   pregabalin 200 MG capsule Commonly known as:  LYRICA TAKE ONE (1) CAPSULE BY MOUTH EVERY MORNING   ranitidine 300 MG tablet Commonly known as:  ZANTAC TAKE ONE TABLET BY MOUTH EVERY EVENING   simvastatin 20 MG  tablet Commonly known as:  ZOCOR TAKE ONE (1) TABLET BY MOUTH EVERY DAY   spironolactone 25 MG tablet Commonly known as:  ALDACTONE TAKE ONE (1) TABLET BY MOUTH EVERY DAY       Disposition: 01-Home or Self Care  Discharge Instructions    Diet - low sodium heart healthy    Complete by:  As directed    Increase activity slowly    Complete by:  As directed  Signed: Alphonsa Overall, M.D., [facility] Surgery Office:  [phone]  07/01/2016, 7:50 AM

## 2016-07-01 NOTE — Progress Notes (Signed)
DC instructions provided and explained to pt and pt's spouse in detail. Prescriptions given to pt. Pt verbalizes understanding of DC instructions. Escorted to lobby in wheel chair via nursing assistance.

## 2016-07-03 ENCOUNTER — Telehealth: Payer: Self-pay | Admitting: *Deleted

## 2016-07-03 NOTE — Telephone Encounter (Signed)
[facility] faxed over 2 week titration. Fax sent to scan

## 2016-07-03 NOTE — Telephone Encounter (Signed)
Talked to [place] at [facility] who will fax over the 2 week titration today

## 2016-07-06 ENCOUNTER — Telehealth: Payer: Self-pay | Admitting: *Deleted

## 2016-07-06 DIAGNOSIS — G4733 Obstructive sleep apnea (adult) (pediatric): Secondary | ICD-10-CM

## 2016-07-06 NOTE — Telephone Encounter (Signed)
Reminder is set to get a download in 2 weeks

## 2016-07-06 NOTE — Telephone Encounter (Signed)
[place] notified of pressure change from 14 cm H20 Patient notified of pressure change and agrees to the change.  New Order for pressure change is in epic

## 2016-07-07 ENCOUNTER — Ambulatory Visit (INDEPENDENT_AMBULATORY_CARE_PROVIDER_SITE_OTHER): Payer: 59 | Admitting: Internal Medicine

## 2016-07-07 ENCOUNTER — Encounter: Payer: Self-pay | Admitting: Internal Medicine

## 2016-07-07 VITALS — BP 136/70 | HR 82 | Temp 98.0°F | Resp 14 | Ht 65.0 in | Wt 291.0 lb

## 2016-07-07 DIAGNOSIS — J181 Lobar pneumonia, unspecified organism: Secondary | ICD-10-CM | POA: Diagnosis not present

## 2016-07-07 DIAGNOSIS — M549 Dorsalgia, unspecified: Secondary | ICD-10-CM | POA: Insufficient documentation

## 2016-07-07 DIAGNOSIS — J189 Pneumonia, unspecified organism: Secondary | ICD-10-CM

## 2016-07-07 DIAGNOSIS — R300 Dysuria: Secondary | ICD-10-CM

## 2016-07-07 DIAGNOSIS — K432 Incisional hernia without obstruction or gangrene: Secondary | ICD-10-CM

## 2016-07-07 LAB — POCT URINALYSIS DIPSTICK
Bilirubin, UA: NEGATIVE
GLUCOSE UA: NEGATIVE
KETONES UA: NEGATIVE
Nitrite, UA: NEGATIVE
PROTEIN UA: NEGATIVE
SPEC GRAV UA: 1.02 (ref 1.010–1.025)
Urobilinogen, UA: 0.2 E.U./dL
pH, UA: 6 (ref 5.0–8.0)

## 2016-07-07 MED ORDER — NITROFURANTOIN MONOHYD MACRO 100 MG PO CAPS: 100.0000 mg | ORAL_CAPSULE | Freq: Two times a day (BID) | ORAL | 0 refills | Status: DC

## 2016-07-07 NOTE — Assessment & Plan Note (Signed)
Suspect muscular but given recent surgery will check U/A to rule out infection. Known chronic hematuria which is investigated by urology and benign etiology.

## 2016-07-07 NOTE — Assessment & Plan Note (Signed)
Cleared on repeat CXR and will resolve problem. Lungs clear on exam and not related to mid back pain.

## 2016-07-07 NOTE — Progress Notes (Signed)
   Subjective:    Patient ID: Heather Frederick, female    DOB: 1955/08/10, 61 y.o.   MRN: [id number]  HPI The patient is a 61 YO female coming in for back and side pain. Going on for about 1 month. She did have a hernia surgery on her stomach in the last week. She is worried about her kidneys as her brother had kidney problems and her lungs due to pneumonia back in February. She is breathing well and no sputum. No SOB on exertion. Some mild discomfort in the stomach from the surgery but no drainage or signs of infection. She is using tylenol for her back with good success. No fevers or chills. No burning with urination.   Review of Systems  Constitutional: Positive for activity change. Negative for appetite change, fatigue, fever and unexpected weight change.  Respiratory: Negative.   Cardiovascular: Negative.   Gastrointestinal: Positive for abdominal distention and abdominal pain. Negative for constipation, diarrhea, nausea and vomiting.  Musculoskeletal: Positive for arthralgias and myalgias. Negative for back pain, gait problem and joint swelling.  Skin: Negative.   Neurological: Negative.       Objective:   Physical Exam  Constitutional: She is oriented to person, place, and time. She appears well-developed and well-nourished.  HENT:  Head: Normocephalic and atraumatic.  Cardiovascular: Normal rate and regular rhythm.   Pulmonary/Chest: Effort normal. No respiratory distress. She has no wheezes. She has no rales.  Abdominal: Soft.  Incision with healing around the umbilicus  Musculoskeletal: She exhibits tenderness.  Mild tenderness mid back paraspinally, no spinal tenderness  Neurological: She is alert and oriented to person, place, and time.  Skin: Skin is warm and dry.   Vitals:   07/07/16 0843  BP: 136/70  Pulse: 82  Resp: 14  Temp: 98 F (36.7 C)  TempSrc: Oral  SpO2: 99%  Weight: 291 lb (132 kg)  Height: 5\' 5"  (1.651 m)      Assessment & Plan:

## 2016-07-07 NOTE — Progress Notes (Signed)
Pre visit review using our clinic review tool, if applicable. No additional management support is needed unless otherwise documented below in the visit note. 

## 2016-07-07 NOTE — Patient Instructions (Signed)
We have sent in the macrobid for a possible infection in the bladder.   The lungs are perfectly clear and likely the back pain is coming from the muscles.

## 2016-07-07 NOTE — Assessment & Plan Note (Addendum)
S/P repair about 1 week ago and healing appropriately. In binder per recommendations. Inspected during exam today and no signs of infection.

## 2016-07-14 ENCOUNTER — Telehealth: Payer: Self-pay | Admitting: Internal Medicine

## 2016-07-14 NOTE — Telephone Encounter (Signed)
Pt is going out of town at lunch today and would like to come in for blood work. She is concerned about her kidneys. She urinated on the bed twice last night. Both of her sides are hurting. Can orders be put in just for that.

## 2016-07-14 NOTE — Telephone Encounter (Signed)
Not without visit

## 2016-07-14 NOTE — Telephone Encounter (Signed)
Patient aware and has apt with urology on Tuesday, I stated to patient if it gets worse to consider going to an urgent care over the weekend or ER

## 2016-07-27 ENCOUNTER — Encounter: Payer: Self-pay | Admitting: Cardiology

## 2016-07-28 ENCOUNTER — Encounter: Payer: Self-pay | Admitting: Internal Medicine

## 2016-07-28 ENCOUNTER — Ambulatory Visit (INDEPENDENT_AMBULATORY_CARE_PROVIDER_SITE_OTHER): Payer: 59 | Admitting: Internal Medicine

## 2016-07-28 DIAGNOSIS — M549 Dorsalgia, unspecified: Secondary | ICD-10-CM | POA: Diagnosis not present

## 2016-07-28 NOTE — Assessment & Plan Note (Signed)
Reminded her that this is likely muscular related to her recent surgery. Can keep using tylenol and otc linaments. Rx for tens unit given today for extra help. She does not need any additional work up for this pain at this time.

## 2016-07-28 NOTE — Progress Notes (Signed)
   Subjective:    Patient ID: Heather Frederick, female    DOB: [birth year], 61 y.o.   MRN: [id number]  HPI The patient is a 61 YO female coming in for mid back pain since her abdominal surgery. This has been about 1-2 months now. She has been evaluated here for this previously with urinary testing which was negative for infection and BMP which had normal kidney function. She has also seen her urologist and cleared from any problems. She is not sure what could be causing the pain. She is using tylenol which is mostly helping but wearing off too early. She is using voltaren and other otc linaments which help mildly.   Review of Systems  Constitutional: Negative.   Respiratory: Negative.   Cardiovascular: Negative.   Gastrointestinal: Positive for abdominal distention and abdominal pain. Negative for constipation, diarrhea and nausea.  Musculoskeletal: Positive for back pain and myalgias. Negative for arthralgias and gait problem.  Skin: Negative.   Neurological: Negative.       Objective:   Physical Exam  Constitutional: She is oriented to person, place, and time. She appears well-developed and well-nourished.  Overweight  HENT:  Head: Normocephalic and atraumatic.  Eyes: EOM are normal.  Neck: Normal range of motion.  Cardiovascular: Normal rate and regular rhythm.   Pulmonary/Chest: Effort normal and breath sounds normal. No respiratory distress. She has no wheezes.  Abdominal: Soft. She exhibits distension. There is no tenderness. There is no rebound.  Musculoskeletal: She exhibits tenderness.  Mild soreness to palpation left mid back paraspinally.   Neurological: She is alert and oriented to person, place, and time. Coordination normal.  Skin: Skin is warm and dry.   Vitals:   07/28/16 0949  BP: 120/68  Pulse: 82  Resp: 16  Temp: 98.1 F (36.7 C)  TempSrc: Oral  SpO2: 98%  Weight: 291 lb (132 kg)  Height: 5\' 5"  (1.651 m)      Assessment & Plan:

## 2016-07-28 NOTE — Patient Instructions (Signed)
We can try the tens units to see if this helps more with the pain.

## 2016-07-28 NOTE — Progress Notes (Signed)
Pre visit review using our clinic review tool, if applicable. No additional management support is needed unless otherwise documented below in the visit note. 

## 2016-07-31 ENCOUNTER — Telehealth: Payer: Self-pay | Admitting: *Deleted

## 2016-07-31 NOTE — Telephone Encounter (Signed)
2 week download printed and scanned

## 2016-07-31 NOTE — Telephone Encounter (Signed)
-----   Message from Freada Bergeron, [place] sent at 07/06/2016  2:48 PM EDT ----- Regarding: 2 week download today is 07/06/16  repeat CPAP download in 2 weeks

## 2016-08-04 ENCOUNTER — Telehealth: Payer: Self-pay | Admitting: Gastroenterology

## 2016-08-04 NOTE — Telephone Encounter (Signed)
Patient with pain in her back and side that her urologist and primary care can't figure out.  She has an incisional hernia from her gallbladder surgery and has an appt to see them again in 2 weeks. The surgeon did not think the pain was from the hernia.  She will come in on Monday and see Dr. Fuller Plan at 10:00

## 2016-08-07 ENCOUNTER — Encounter: Payer: Self-pay | Admitting: Gastroenterology

## 2016-08-07 ENCOUNTER — Ambulatory Visit (INDEPENDENT_AMBULATORY_CARE_PROVIDER_SITE_OTHER): Payer: 59 | Admitting: Gastroenterology

## 2016-08-07 ENCOUNTER — Other Ambulatory Visit (INDEPENDENT_AMBULATORY_CARE_PROVIDER_SITE_OTHER): Payer: 59

## 2016-08-07 VITALS — BP 96/70 | HR 80 | Ht 65.0 in | Wt 291.0 lb

## 2016-08-07 DIAGNOSIS — M546 Pain in thoracic spine: Secondary | ICD-10-CM

## 2016-08-07 DIAGNOSIS — R109 Unspecified abdominal pain: Secondary | ICD-10-CM | POA: Diagnosis not present

## 2016-08-07 DIAGNOSIS — G8929 Other chronic pain: Secondary | ICD-10-CM | POA: Diagnosis not present

## 2016-08-07 LAB — CBC WITH DIFFERENTIAL/PLATELET
BASOS PCT: 0.9 % (ref 0.0–3.0)
Basophils Absolute: 0.1 10*3/uL (ref 0.0–0.1)
EOS PCT: 3.2 % (ref 0.0–5.0)
Eosinophils Absolute: 0.2 10*3/uL (ref 0.0–0.7)
HEMATOCRIT: 41.8 % (ref 36.0–46.0)
HEMOGLOBIN: 13.9 g/dL (ref 12.0–15.0)
LYMPHS PCT: 29.6 % (ref 12.0–46.0)
Lymphs Abs: 1.9 10*3/uL (ref 0.7–4.0)
MCHC: 33.3 g/dL (ref 30.0–36.0)
MCV: 87.5 fl (ref 78.0–100.0)
MONO ABS: 0.7 10*3/uL (ref 0.1–1.0)
MONOS PCT: 10 % (ref 3.0–12.0)
Neutro Abs: 3.7 10*3/uL (ref 1.4–7.7)
Neutrophils Relative %: 56.3 % (ref 43.0–77.0)
Platelets: 189 10*3/uL (ref 150.0–400.0)
RBC: 4.77 Mil/uL (ref 3.87–5.11)
RDW: 14.2 % (ref 11.5–15.5)
WBC: 6.6 10*3/uL (ref 4.0–10.5)

## 2016-08-07 LAB — COMPREHENSIVE METABOLIC PANEL
ALBUMIN: 4.3 g/dL (ref 3.5–5.2)
ALK PHOS: 87 U/L (ref 39–117)
ALT: 15 U/L (ref 0–35)
AST: 14 U/L (ref 0–37)
BUN: 10 mg/dL (ref 6–23)
CALCIUM: 9.9 mg/dL (ref 8.4–10.5)
CHLORIDE: 104 meq/L (ref 96–112)
CO2: 30 mEq/L (ref 19–32)
Creatinine, Ser: 0.94 mg/dL (ref 0.40–1.20)
GFR: 64.82 mL/min (ref >60.00)
Glucose, Bld: 136 mg/dL — ABNORMAL HIGH (ref 70–99)
POTASSIUM: 4.4 meq/L (ref 3.5–5.1)
SODIUM: 141 meq/L (ref 135–145)
TOTAL PROTEIN: 7.3 g/dL (ref 6.0–8.3)
Total Bilirubin: 0.4 mg/dL (ref 0.2–1.2)

## 2016-08-07 LAB — LIPASE: LIPASE: 11 U/L (ref 11.0–59.0)

## 2016-08-07 MED ORDER — CYCLOBENZAPRINE HCL 10 MG PO TABS: 10.0000 mg | ORAL_TABLET | Freq: Every day | ORAL | 0 refills | Status: DC

## 2016-08-07 NOTE — Progress Notes (Signed)
History of Present Illness: This is a 61 year old female complaining of mid back pain and bilateral flank pain since January. Symptoms worsen with certain movements. They do not relate any digestive function. She states the pain is more bothersome that radiates further anteriorly from her flanks. Muscle relaxing ointments and Tylenol substantially help her symptoms. She underwent repair of umbilicus hernia by Dr. Zella Richer with mesh placed in April.  Allergies  Allergen Reactions  . Topamax [Topiramate] Other (See Comments)    :Stroke like symptoms  . Aleve [Naproxen Sodium] Hives    Has tolerated Voltaren topical as well as aspirin.  . Bee Venom Swelling  . Echinacea Hives  . Other Other (See Comments)    Feathers cause sinus congestion  . Sulfa Antibiotics Hives  . Advil [Ibuprofen] Hives    Has tolerated Voltaren topical as well as aspirin.   Outpatient Medications Prior to Visit  Medication Sig Dispense Refill  . acetaminophen (TYLENOL) 650 MG CR tablet Take 1,300 mg by mouth every 8 (eight) hours as needed for pain.    Marland Kitchen amLODipine (NORVASC) 5 MG tablet Take 5 mg by mouth daily.     Marland Kitchen aspirin EC 81 MG tablet Take 81 mg by mouth daily.    . Biotin 2500 MCG CAPS Take by mouth.    . cholestyramine (QUESTRAN) 4 g packet Take 4 g by mouth daily.     . citalopram (CELEXA) 20 MG tablet TAKE ONE (1) TABLET BY MOUTH EVERY DAY 30 tablet 3  . Cranberry-Vitamin C-Vitamin E (CRANBERRY PLUS VITAMIN C PO) Take 1 tablet by mouth daily. 4200 mg    . Cyanocobalamin (HM SUPER VITAMIN B12) 2500 MCG CHEW Chew 2,500 mg by mouth daily.    Marland Kitchen eletriptan (RELPAX) 40 MG tablet Take 1 tablet (40 mg total) by mouth every 2 (two) hours as needed for migraine. 8 tablet 11  . Lactobacillus Rhamnosus, GG, (CULTURELLE PO) Take 1 capsule by mouth daily.    Marland Kitchen loratadine (CLARITIN) 10 MG tablet Take 10-20 mg by mouth 2 (two) times daily as needed for allergies.     . Multiple Vitamin (MULTIVITAMIN WITH MINERALS)  TABS Take 1 tablet by mouth daily.     . Multiple Vitamins-Minerals (OCUVITE ADULT 50+ PO) Take 1 tablet by mouth daily.    . nitrofurantoin (MACRODANTIN) 100 MG capsule Take 100 mg by mouth daily.     . nitroGLYCERIN (NITROSTAT) 0.4 MG SL tablet Place 1 tablet (0.4 mg total) under the tongue every 5 (five) minutes as needed for chest pain. 25 tablet 5  . Omega-3 Fatty Acids (FISH OIL PO) Take 1,200 mg by mouth every morning.     . ondansetron (ZOFRAN) 8 MG tablet Take 1 tablet (8 mg total) by mouth every 6 (six) hours. 40 tablet 0  . oxybutynin (DITROPAN-XL) 10 MG 24 hr tablet Take 10 mg by mouth every morning.     . pantoprazole (PROTONIX) 40 MG tablet TAKE ONE (1) TABLET BY MOUTH TWO (2) TIMES DAILY 60 tablet 5  . pregabalin (LYRICA) 200 MG capsule TAKE ONE (1) CAPSULE BY MOUTH EVERY MORNING 30 capsule 5  . ranitidine (ZANTAC) 300 MG tablet TAKE ONE TABLET BY MOUTH EVERY EVENING 30 tablet 11  . simvastatin (ZOCOR) 20 MG tablet TAKE ONE (1) TABLET BY MOUTH EVERY DAY 90 tablet 3  . spironolactone (ALDACTONE) 25 MG tablet TAKE ONE (1) TABLET BY MOUTH EVERY DAY 90 tablet 3  . diclofenac sodium (VOLTAREN) 1 %  GEL Apply 1 g topically 4 (four) times daily as needed (for pain).     No facility-administered medications prior to visit.    Past Medical History:  Diagnosis Date  . Allergy   . Anxiety   . Arthritis   . Benign essential HTN 09/23/2014  . Chronic kidney disease    uti  . Depression   . Dyslipidemia   . Elevated liver enzymes   . Family history of anesthesia complication    father has a severe hard time waking up  . Gallstones    a. Seen on CT 01/2014.  Marland Kitchen GERD (gastroesophageal reflux disease)   . Hard of hearing   . Hepatic steatosis   . History of frequent urinary tract infections   . Hyperlipidemia   . Hypertension   . Lateral epicondylitis of right elbow   . Mental disorder   . Meralgia paresthetica of right side 10/02/2012   slight at 05/2014  . Migraine headache   .  Obesity   . OSA (obstructive sleep apnea)    severe with AHI 37/hr now on CPAP at 18cm H2O  . Osteoarthritis   . Pneumonia    Feb 2018  . Raynaud disease    in feet per patient   . Sleep apnea    wears c-pap  . Urinary tract infection    Past Surgical History:  Procedure Laterality Date  . ABDOMINAL HYSTERECTOMY  1/04   partial  . BLADDER SUSPENSION  6/10  . CARDIAC CATHETERIZATION    . carpel tunnel left/right  7/08, 8/08 Bilateral 8/08 and 7/08  . carpel tunnel rel Right 4/12  . CHOLECYSTECTOMY N/A 06/11/2014   Procedure: LAPAROSCOPIC CHOLECYSTECTOMY WITH ATTEMPTED INTRAOPERATIVE CHOLANGIOGRAM;  Surgeon: Jackolyn Confer, MD;  Location: [facility];  Service: General;  Laterality: N/A;  . COLONOSCOPY    . ERCP N/A 10/19/2014   Procedure: ENDOSCOPIC RETROGRADE CHOLANGIOPANCREATOGRAPHY (ERCP);  Surgeon: Ladene Artist, MD;  Location: [facility];  Service: Endoscopy;  Laterality: N/A;  . INCISIONAL HERNIA REPAIR N/A 06/30/2016   Procedure: LAPAROSCOPIC REPAIR OF INCISIONAL HERNIA WITH MESH;  Surgeon: Jackolyn Confer, MD;  Location: [facility];  Service: General;  Laterality: N/A;  . INSERTION OF MESH N/A 06/30/2016   Procedure: INSERTION OF MESH;  Surgeon: Jackolyn Confer, MD;  Location: [facility];  Service: General;  Laterality: N/A;  . JOINT REPLACEMENT    . KNEE ARTHROSCOPY Left 12/12  . KNEE ARTHROSCOPY Right 12/06  . LEFT HEART CATHETERIZATION WITH CORONARY ANGIOGRAM N/A 03/10/2014   Procedure: LEFT HEART CATHETERIZATION WITH CORONARY ANGIOGRAM;  Surgeon: Sinclair Grooms, MD;  Location: [facility];  Service: Cardiovascular;  Laterality: N/A;  . PLANTAR FASCIA RELEASE Right 12/10  . radial tunnel release     right arm   . ROTATOR CUFF REPAIR Left 6/11  . tennis elbow release Right 7/04  . TOTAL KNEE ARTHROPLASTY Left 09/10/2012   Procedure: TOTAL KNEE ARTHROPLASTY- left;  Surgeon: Garald Balding, MD;  Location: [facility];  Service: Orthopedics;  Laterality: Left;  Left total knee  arthroplasty   Social History   Social History  . Marital status: Married    Spouse name: Remo Lipps  . Number of children: 2  . Years of education: 16   Occupational History  . [place] History Main Topics  . Smoking status: Never Smoker  . Smokeless tobacco: Never Used     Comment: Secondhand - from family growing up, workplace intermittently  .  Alcohol use 0.0 oz/week     Comment: occasionally - intermittent, no more than twice a week  . Drug use: No  . Sexual activity: Not Asked   Other Topics Concern  . None   Social History Narrative   Patient is married Remo Lipps) and lives at home with her husband and child.   Patient has two children.   Patient has a college education.   Patient is right-handed.   Patient drinks 1-2 cup of coffee/tea daily.   Family History  Problem Relation Age of Onset  . Hypertension Mother   . Stroke Mother   . Liver disease Mother        Abcess  . Hypertension Father   . Coronary artery disease Father   . Migraines Father   . Clotting disorder Father   . Kidney failure Brother   . Hypertension Brother   . Migraines Brother   . Migraines Daughter   . Breast cancer Other        Niece with breast cancer  . Colon cancer Paternal Grandmother   . Pancreatic cancer Paternal Grandmother   . Stomach cancer Paternal Grandmother   . Breast cancer Cousin   . Esophageal cancer Neg Hx   . Rectal cancer Neg Hx       Physical Exam: General: Well developed, well nourished, obese, no acute distress Head: Normocephalic and atraumatic Eyes:  sclerae anicteric, EOMI Ears: Normal auditory acuity Mouth: No deformity or lesions Lungs: Clear throughout to auscultation Heart: Regular rate and rhythm; no murmurs, rubs or bruits Abdomen: Soft, large, non tender and non distended. No masses, hepatosplenomegaly noted. Normal Bowel sounds. Mild soft tissue fullness at her umbilicus however I cannot clearly appreciate a  hernia. Back: Mild tenderness over lower posterior lower and lateral ribs bilaterally. Musculoskeletal: Symmetrical with no gross deformities  Pulses:  Normal pulses noted Extremities: No clubbing, cyanosis, edema or deformities noted Neurological: Alert oriented x 4, grossly nonfocal Psychological:  Alert and cooperative. Normal mood and affect  Assessment and Recommendations:  1.  Thoracic back pain, bilateral flank pain, occasional abdominal pain which is likely referred her back and flanks. Symptoms are very likely musculoskeletal. Obtain CMP, CBC and lipase today. Trial of Flexeril 5-10 mg every afternoon. Return to PCP for ongoing care. If the abdominal pain component persists or further evaluation of her recent hernia repair is felt to be needed by Dr. Zella Richer, we can proceed with an abdominal/pelvic CT.

## 2016-08-07 NOTE — Patient Instructions (Addendum)
We have sent the following medications to your pharmacy for you to pick up at your convenience: Flexeril.   Your physician has requested that you go to the basement for lab work before leaving today.  Follow-up with your primary care physician.   Thank you for choosing me and [facility].  Pricilla Riffle. Dagoberto Ligas., MD., Marval Regal

## 2016-08-08 NOTE — Telephone Encounter (Signed)
,   Eber Hong, MD  Freada Bergeron, CMA        Good AHI and compliance. Continue current CPAP settings.     CPAP  Order: [id number]  Status:  Final result Visible to patient:  Yes (MyChart)  Notes recorded by Freada Bergeron, CMA on 08/08/2016 at 11:11 AM EDT Informed patient of results and patient understanding was verbalized. Patient understands current cpap settings will stay the same. Patient was grateful for the call and thanked me. ------  Notes recorded by Sueanne Margarita, MD on 08/04/2016 at 1:27 PM EDT Good AHI and compliance. Continue current CPAP settings.     Last Resulted: 07/27/16 16:49

## 2016-08-08 NOTE — Telephone Encounter (Signed)
-----   Message from Sueanne Margarita, MD sent at 08/04/2016  1:27 PM EDT ----- Good AHI and compliance.  Continue current CPAP settings.

## 2016-08-11 ENCOUNTER — Encounter: Payer: Self-pay | Admitting: Internal Medicine

## 2016-08-11 ENCOUNTER — Ambulatory Visit (INDEPENDENT_AMBULATORY_CARE_PROVIDER_SITE_OTHER): Payer: 59 | Admitting: Internal Medicine

## 2016-08-11 ENCOUNTER — Ambulatory Visit
Admission: RE | Admit: 2016-08-11 | Discharge: 2016-08-11 | Disposition: A | Discharge status: Home / Self Care | Payer: 59 | Source: Ambulatory Visit | Attending: Internal Medicine | Admitting: Internal Medicine

## 2016-08-11 VITALS — BP 130/70 | HR 80 | Temp 98.7°F | Resp 16 | Ht 65.0 in | Wt 288.0 lb

## 2016-08-11 DIAGNOSIS — R1010 Upper abdominal pain, unspecified: Secondary | ICD-10-CM

## 2016-08-11 DIAGNOSIS — M549 Dorsalgia, unspecified: Secondary | ICD-10-CM

## 2016-08-11 MED ORDER — IOPAMIDOL (ISOVUE-300) INJECTION 61%
100.0000 mL | Freq: Once | INTRAVENOUS | Status: AC | PRN
  Administered 2016-08-11: 100 mL via INTRAVENOUS

## 2016-08-11 NOTE — Assessment & Plan Note (Signed)
She is insistent on getting CT abdomen since GI mentioned it. She does have more pain in the upper stomach and flanks which we talked about is likely musculoskeletal. She has had labs several times and ruled out kidney/bladder infection.

## 2016-08-11 NOTE — Progress Notes (Signed)
   Subjective:    Patient ID: Heather Frederick, female    DOB: [birth year], 61 y.o.   MRN: [id number]  HPI The patient is a 61 YO female coming in for back and stomach pain. Going on since her abdominal surgery. The surgeon does not think that it is related to problems with hernia repair. She is having pain in the mid back which is about the same as before. She has tried the tens unit and flexeril since last visit and they have not helped much. Still tylenol is very effective but the long acting tylenol only lasts about 5 hours instead of 8 hours. She is having more abdominal pain which worries her. She saw GI several days ago who gave her the flexeril and she states that they mentioned something about a scan of her stomach and she wants this. She is tired of having the pain and wants to know the cause of the pain. She denies that eating makes it better or worse. Some bloating/swelling which worsens throughout the day. She is not eating as much due to the swelling. She denies skin rash or wound problem with her surgical site. Denies vomiting but some nausea. Taking protonix BID and zantac and does not feel any reflux symptoms. She has had cholecystectomy in the past. Surgery was about 1 month ago.   Review of Systems  Constitutional: Positive for activity change and appetite change. Negative for chills, fatigue, fever and unexpected weight change.  HENT: Negative.   Eyes: Negative.   Respiratory: Negative for cough, chest tightness and shortness of breath.   Cardiovascular: Negative for chest pain, palpitations and leg swelling.  Gastrointestinal: Positive for abdominal distention, abdominal pain and nausea. Negative for constipation, diarrhea and vomiting.  Musculoskeletal: Positive for arthralgias, back pain and myalgias. Negative for gait problem and joint swelling.  Skin: Negative.   Neurological: Negative.   Psychiatric/Behavioral: Negative.       Objective:   Physical Exam  Constitutional:  She is oriented to person, place, and time. She appears well-developed and well-nourished.  HENT:  Head: Normocephalic and atraumatic.  Eyes: EOM are normal.  Neck: Normal range of motion.  Cardiovascular: Normal rate and regular rhythm.   Pulmonary/Chest: Effort normal. No respiratory distress. She has no wheezes. She has no rales.  Abdominal: Soft. Bowel sounds are normal. She exhibits no distension and no mass. There is tenderness. There is no rebound and no guarding.  Upper abdomen tenderness and bilateral flank discomfort  Musculoskeletal: She exhibits tenderness.  Pain mid thoracic paraspinal and flank  Neurological: She is alert and oriented to person, place, and time. Coordination normal.  Skin: Skin is warm and dry.   Vitals:   08/11/16 0929  BP: 130/70  Pulse: 80  Resp: 16  Temp: 98.7 F (37.1 C)  TempSrc: Oral  SpO2: 98%  Weight: 288 lb (130.6 kg)  Height: 5\' 5"  (1.651 m)      Assessment & Plan:

## 2016-08-11 NOTE — Patient Instructions (Signed)
We will get the CT of the stomach and the abdomen

## 2016-08-11 NOTE — Assessment & Plan Note (Signed)
We talked again about how this is muscular pain and likely was exacerbated by the surgery with time lying on stretcher for surgery and in recovery and this could take months to resolve. She is taking a safe dose of tylenol and would not change pain medication as this is effective at this time. She has tried tens unit and this was not helpful.

## 2016-08-14 ENCOUNTER — Telehealth: Payer: Self-pay | Admitting: Gastroenterology

## 2016-08-14 ENCOUNTER — Other Ambulatory Visit: Payer: 59

## 2016-08-14 DIAGNOSIS — R932 Abnormal findings on diagnostic imaging of liver and biliary tract: Secondary | ICD-10-CM

## 2016-08-14 NOTE — Telephone Encounter (Signed)
Message  Received: Yesterday  Message Contents  Ladene Artist, MD sent to Marlon Pel, RN        Unfortunate.  Abd [place] in 01/2016 showed only fatty liver.  LFTs on 08/07/2016 were normal.   US guided liver biopsy given likely liver mets would be the safest, easiest and most direct method to obtain tissue. Please schedule.   EUS is more useful to obtain tissue if only the pancreas is involved.   ERCP not needed at this time as she has no evidence of biliary obstruction.    Patient notified of the recommendations. Orders placed for the biopsy. She is notified that they will be in contact with her in the next day or so to get this set up.

## 2016-08-14 NOTE — Telephone Encounter (Signed)
Patient is scheduled for liver bx for 08/16/16

## 2016-08-15 ENCOUNTER — Other Ambulatory Visit: Payer: Self-pay | Admitting: Radiology

## 2016-08-16 ENCOUNTER — Ambulatory Visit (HOSPITAL_COMMUNITY)
Admission: RE | Admit: 2016-08-16 | Discharge: 2016-08-16 | Disposition: A | Discharge status: Home / Self Care | Payer: 59 | Source: Ambulatory Visit | Attending: Gastroenterology | Admitting: Gastroenterology

## 2016-08-16 ENCOUNTER — Encounter (HOSPITAL_COMMUNITY): Payer: Self-pay

## 2016-08-16 DIAGNOSIS — Z888 Allergy status to other drugs, medicaments and biological substances status: Secondary | ICD-10-CM | POA: Diagnosis not present

## 2016-08-16 DIAGNOSIS — Z91030 Bee allergy status: Secondary | ICD-10-CM | POA: Diagnosis not present

## 2016-08-16 DIAGNOSIS — Z90710 Acquired absence of both cervix and uterus: Secondary | ICD-10-CM | POA: Insufficient documentation

## 2016-08-16 DIAGNOSIS — M1990 Unspecified osteoarthritis, unspecified site: Secondary | ICD-10-CM | POA: Diagnosis not present

## 2016-08-16 DIAGNOSIS — Z8249 Family history of ischemic heart disease and other diseases of the circulatory system: Secondary | ICD-10-CM | POA: Insufficient documentation

## 2016-08-16 DIAGNOSIS — Z841 Family history of disorders of kidney and ureter: Secondary | ICD-10-CM | POA: Insufficient documentation

## 2016-08-16 DIAGNOSIS — Z87440 Personal history of urinary (tract) infections: Secondary | ICD-10-CM | POA: Insufficient documentation

## 2016-08-16 DIAGNOSIS — G43909 Migraine, unspecified, not intractable, without status migrainosus: Secondary | ICD-10-CM | POA: Diagnosis not present

## 2016-08-16 DIAGNOSIS — Z832 Family history of diseases of the blood and blood-forming organs and certain disorders involving the immune mechanism: Secondary | ICD-10-CM | POA: Insufficient documentation

## 2016-08-16 DIAGNOSIS — C227 Other specified carcinomas of liver: Secondary | ICD-10-CM | POA: Diagnosis not present

## 2016-08-16 DIAGNOSIS — Z886 Allergy status to analgesic agent status: Secondary | ICD-10-CM | POA: Diagnosis not present

## 2016-08-16 DIAGNOSIS — I129 Hypertensive chronic kidney disease with stage 1 through stage 4 chronic kidney disease, or unspecified chronic kidney disease: Secondary | ICD-10-CM | POA: Diagnosis not present

## 2016-08-16 DIAGNOSIS — Z803 Family history of malignant neoplasm of breast: Secondary | ICD-10-CM | POA: Insufficient documentation

## 2016-08-16 DIAGNOSIS — G4733 Obstructive sleep apnea (adult) (pediatric): Secondary | ICD-10-CM | POA: Insufficient documentation

## 2016-08-16 DIAGNOSIS — Z955 Presence of coronary angioplasty implant and graft: Secondary | ICD-10-CM | POA: Insufficient documentation

## 2016-08-16 DIAGNOSIS — I7300 Raynaud's syndrome without gangrene: Secondary | ICD-10-CM | POA: Diagnosis not present

## 2016-08-16 DIAGNOSIS — Z96652 Presence of left artificial knee joint: Secondary | ICD-10-CM | POA: Diagnosis not present

## 2016-08-16 DIAGNOSIS — E785 Hyperlipidemia, unspecified: Secondary | ICD-10-CM | POA: Insufficient documentation

## 2016-08-16 DIAGNOSIS — Z800 Family history of malignant neoplasm of digestive organs: Secondary | ICD-10-CM | POA: Insufficient documentation

## 2016-08-16 DIAGNOSIS — N189 Chronic kidney disease, unspecified: Secondary | ICD-10-CM | POA: Insufficient documentation

## 2016-08-16 DIAGNOSIS — Z882 Allergy status to sulfonamides status: Secondary | ICD-10-CM | POA: Insufficient documentation

## 2016-08-16 DIAGNOSIS — K219 Gastro-esophageal reflux disease without esophagitis: Secondary | ICD-10-CM | POA: Insufficient documentation

## 2016-08-16 DIAGNOSIS — Z9049 Acquired absence of other specified parts of digestive tract: Secondary | ICD-10-CM | POA: Diagnosis not present

## 2016-08-16 DIAGNOSIS — Z7982 Long term (current) use of aspirin: Secondary | ICD-10-CM | POA: Insufficient documentation

## 2016-08-16 DIAGNOSIS — K869 Disease of pancreas, unspecified: Secondary | ICD-10-CM | POA: Diagnosis not present

## 2016-08-16 DIAGNOSIS — E669 Obesity, unspecified: Secondary | ICD-10-CM | POA: Diagnosis not present

## 2016-08-16 DIAGNOSIS — Z9889 Other specified postprocedural states: Secondary | ICD-10-CM | POA: Insufficient documentation

## 2016-08-16 DIAGNOSIS — R932 Abnormal findings on diagnostic imaging of liver and biliary tract: Secondary | ICD-10-CM

## 2016-08-16 DIAGNOSIS — Z823 Family history of stroke: Secondary | ICD-10-CM | POA: Diagnosis not present

## 2016-08-16 LAB — CBC WITH DIFFERENTIAL/PLATELET
Basophils Absolute: 0 10*3/uL (ref 0.0–0.1)
Basophils Relative: 0 %
Eosinophils Absolute: 0.1 10*3/uL (ref 0.0–0.7)
Eosinophils Relative: 2 %
HCT: 43.2 % (ref 36.0–46.0)
HEMOGLOBIN: 14.7 g/dL (ref 12.0–15.0)
LYMPHS ABS: 1.8 10*3/uL (ref 0.7–4.0)
Lymphocytes Relative: 37 %
MCH: 29.6 pg (ref 26.0–34.0)
MCHC: 34 g/dL (ref 30.0–36.0)
MCV: 86.9 fL (ref 78.0–100.0)
MONOS PCT: 16 %
Monocytes Absolute: 0.8 10*3/uL (ref 0.1–1.0)
NEUTROS ABS: 2.2 10*3/uL (ref 1.7–7.7)
NEUTROS PCT: 45 %
PLATELETS: 174 10*3/uL (ref 150–400)
RBC: 4.97 MIL/uL (ref 3.87–5.11)
RDW: 13.7 % (ref 11.5–15.5)
WBC: 4.9 10*3/uL (ref 4.0–10.5)

## 2016-08-16 LAB — PROTIME-INR
INR: 1
Prothrombin Time: 13.2 seconds (ref 11.4–15.2)

## 2016-08-16 MED ORDER — FENTANYL CITRATE (PF) 100 MCG/2ML IJ SOLN
INTRAMUSCULAR | Status: AC
  Filled 2016-08-16: qty 4

## 2016-08-16 MED ORDER — NALOXONE HCL 0.4 MG/ML IJ SOLN
INTRAMUSCULAR | Status: DC
Start: 2016-08-16 — End: 2016-08-16
  Filled 2016-08-16: qty 1

## 2016-08-16 MED ORDER — MIDAZOLAM HCL 2 MG/2ML IJ SOLN
INTRAMUSCULAR | Status: AC | PRN
  Administered 2016-08-16: 1 mg via INTRAVENOUS
  Administered 2016-08-16 (×2): 0.5 mg via INTRAVENOUS

## 2016-08-16 MED ORDER — FLUMAZENIL 0.5 MG/5ML IV SOLN
INTRAVENOUS | Status: AC
  Filled 2016-08-16: qty 5

## 2016-08-16 MED ORDER — SODIUM CHLORIDE 0.9 % IV SOLN
INTRAVENOUS | Status: DC
  Administered 2016-08-16: 12:00:00 via INTRAVENOUS

## 2016-08-16 MED ORDER — MIDAZOLAM HCL 2 MG/2ML IJ SOLN
INTRAMUSCULAR | Status: AC
  Filled 2016-08-16: qty 6

## 2016-08-16 MED ORDER — FENTANYL CITRATE (PF) 100 MCG/2ML IJ SOLN
INTRAMUSCULAR | Status: AC | PRN
  Administered 2016-08-16: 50 ug via INTRAVENOUS
  Administered 2016-08-16 (×2): 25 ug via INTRAVENOUS

## 2016-08-16 NOTE — H&P (Signed)
Chief Complaint: Patient was seen in consultation today for biopsy of liver lesion at the request of Stark,Malcolm T  Referring Physician(s): Stark,Malcolm T  Supervising Physician: Sandi Mariscal  Patient Status: [place]  History of Present Illness: Heather Frederick is a 60 y.o. female being worked up for liver lesions and pancreatic mass. She is referred for liver lesion biopsy. PMHx, meds, imaging, allergies reviewed. Has been NPO this am. Husband at bedside  Past Medical History:  Diagnosis Date  . Allergy   . Anxiety   . Arthritis   . Benign essential HTN 09/23/2014  . Chronic kidney disease    uti  . Depression   . Dyslipidemia   . Elevated liver enzymes   . Family history of anesthesia complication    father has a severe hard time waking up  . Gallstones    a. Seen on CT 01/2014.  Marland Kitchen GERD (gastroesophageal reflux disease)   . Hard of hearing   . Hepatic steatosis   . History of frequent urinary tract infections   . Hyperlipidemia   . Hypertension   . Lateral epicondylitis of right elbow   . Mental disorder   . Meralgia paresthetica of right side 10/02/2012   slight at 05/2014  . Migraine headache   . Obesity   . OSA (obstructive sleep apnea)    severe with AHI 37/hr now on CPAP at 18cm H2O  . Osteoarthritis   . Pneumonia    Feb 2018  . Raynaud disease    in feet per patient   . Sleep apnea    wears c-pap  . Urinary tract infection     Past Surgical History:  Procedure Laterality Date  . ABDOMINAL HYSTERECTOMY  1/04   partial  . BLADDER SUSPENSION  6/10  . CARDIAC CATHETERIZATION    . carpel tunnel left/right  7/08, 8/08 Bilateral 8/08 and 7/08  . carpel tunnel rel Right 4/12  . CHOLECYSTECTOMY N/A 06/11/2014   Procedure: LAPAROSCOPIC CHOLECYSTECTOMY WITH ATTEMPTED INTRAOPERATIVE CHOLANGIOGRAM;  Surgeon: Jackolyn Confer, MD;  Location: [facility];  Service: General;  Laterality: N/A;  . COLONOSCOPY    . ERCP N/A 10/19/2014   Procedure: ENDOSCOPIC  RETROGRADE CHOLANGIOPANCREATOGRAPHY (ERCP);  Surgeon: Ladene Artist, MD;  Location: [facility];  Service: Endoscopy;  Laterality: N/A;  . INCISIONAL HERNIA REPAIR N/A 06/30/2016   Procedure: LAPAROSCOPIC REPAIR OF INCISIONAL HERNIA WITH MESH;  Surgeon: Jackolyn Confer, MD;  Location: [facility];  Service: General;  Laterality: N/A;  . INSERTION OF MESH N/A 06/30/2016   Procedure: INSERTION OF MESH;  Surgeon: Jackolyn Confer, MD;  Location: [facility];  Service: General;  Laterality: N/A;  . JOINT REPLACEMENT    . KNEE ARTHROSCOPY Left 12/12  . KNEE ARTHROSCOPY Right 12/06  . LEFT HEART CATHETERIZATION WITH CORONARY ANGIOGRAM N/A 03/10/2014   Procedure: LEFT HEART CATHETERIZATION WITH CORONARY ANGIOGRAM;  Surgeon: Sinclair Grooms, MD;  Location: [facility];  Service: Cardiovascular;  Laterality: N/A;  . PLANTAR FASCIA RELEASE Right 12/10  . radial tunnel release     right arm   . ROTATOR CUFF REPAIR Left 6/11  . tennis elbow release Right 7/04  . TOTAL KNEE ARTHROPLASTY Left 09/10/2012   Procedure: TOTAL KNEE ARTHROPLASTY- left;  Surgeon: Garald Balding, MD;  Location: [facility];  Service: Orthopedics;  Laterality: Left;  Left total knee arthroplasty    Allergies: Topamax [topiramate]; Aleve [naproxen sodium]; Bee venom; Echinacea; Other; Sulfa antibiotics; and Advil [ibuprofen]  Medications:  Prior to Admission medications   Medication Sig Start Date End Date Taking? Authorizing Provider  acetaminophen (TYLENOL) 650 MG CR tablet Take 1,300 mg by mouth every 8 (eight) hours as needed for pain.    [provider]  amLODipine (NORVASC) 5 MG tablet Take 5 mg by mouth daily.  04/22/16   [provider]  aspirin EC 81 MG tablet Take 81 mg by mouth daily.    [provider]  Biotin 2500 MCG CAPS Take by mouth.    [provider]  cholestyramine (QUESTRAN) 4 g packet Take 4 g by mouth daily.     [provider]  citalopram (CELEXA) 20 MG tablet TAKE ONE (1)  TABLET BY MOUTH EVERY DAY 05/26/16   Hoyt Koch, MD  Cranberry-Vitamin C-Vitamin E (CRANBERRY PLUS VITAMIN C PO) Take 1 tablet by mouth daily. 4200 mg    [provider]  Cyanocobalamin (HM SUPER VITAMIN B12) 2500 MCG CHEW Chew 2,500 mg by mouth daily.    [provider]  eletriptan (RELPAX) 40 MG tablet Take 1 tablet (40 mg total) by mouth every 2 (two) hours as needed for migraine. 06/26/16   Dennie Bible, NP  Lactobacillus Rhamnosus, GG, (CULTURELLE PO) Take 1 capsule by mouth daily.    [provider]  loratadine (CLARITIN) 10 MG tablet Take 10-20 mg by mouth 2 (two) times daily as needed for allergies.     [provider]  Multiple Vitamin (MULTIVITAMIN WITH MINERALS) TABS Take 1 tablet by mouth daily.     [provider]  Multiple Vitamins-Minerals (OCUVITE ADULT 50+ PO) Take 1 tablet by mouth daily.    [provider]  nitrofurantoin (MACRODANTIN) 100 MG capsule Take 100 mg by mouth daily.  07/23/15   [provider]  nitroGLYCERIN (NITROSTAT) 0.4 MG SL tablet Place 1 tablet (0.4 mg total) under the tongue every 5 (five) minutes as needed for chest pain. 06/28/15   Belva Crome, MD  Omega-3 Fatty Acids (FISH OIL PO) Take 1,200 mg by mouth every morning.     [provider]  ondansetron (ZOFRAN) 8 MG tablet Take 1 tablet (8 mg total) by mouth every 6 (six) hours. 05/11/16   Hoyt Koch, MD  oxybutynin (DITROPAN-XL) 10 MG 24 hr tablet Take 10 mg by mouth every morning.  05/15/13   [provider]  pantoprazole (PROTONIX) 40 MG tablet TAKE ONE (1) TABLET BY MOUTH TWO (2) TIMES DAILY 02/24/16   Ladene Artist, MD  pregabalin (LYRICA) 200 MG capsule TAKE ONE (1) CAPSULE BY MOUTH EVERY MORNING 06/26/16   Dennie Bible, NP  ranitidine (ZANTAC) 300 MG tablet TAKE ONE TABLET BY MOUTH EVERY EVENING 01/20/16   Ladene Artist, MD  simvastatin (ZOCOR) 20 MG tablet TAKE ONE (1) TABLET BY MOUTH  EVERY DAY 02/29/16   Belva Crome, MD  spironolactone (ALDACTONE) 25 MG tablet TAKE ONE (1) TABLET BY MOUTH EVERY DAY 02/29/16   Belva Crome, MD     Family History  Problem Relation Age of Onset  . Hypertension Mother   . Stroke Mother   . Liver disease Mother        Abcess  . Hypertension Father   . Coronary artery disease Father   . Migraines Father   . Clotting disorder Father   . Kidney failure Brother   . Hypertension Brother   . Migraines Brother   . Migraines Daughter   . Breast cancer Other  Niece with breast cancer  . Colon cancer Paternal Grandmother   . Pancreatic cancer Paternal Grandmother   . Stomach cancer Paternal Grandmother   . Breast cancer Cousin   . Esophageal cancer Neg Hx   . Rectal cancer Neg Hx     Social History   Social History  . Marital status: Married    Spouse name: Remo Lipps  . Number of children: 2  . Years of education: 16   Occupational History  . [place] History Main Topics  . Smoking status: Never Smoker  . Smokeless tobacco: Never Used     Comment: Secondhand - from family growing up, workplace intermittently  . Alcohol use 0.0 oz/week     Comment: occasionally - intermittent, no more than twice a week  . Drug use: No  . Sexual activity: Not Asked   Other Topics Concern  . None   Social History Narrative   Patient is married Remo Lipps) and lives at home with her husband and child.   Patient has two children.   Patient has a college education.   Patient is right-handed.   Patient drinks 1-2 cup of coffee/tea daily.    Review of Systems: A 12 point ROS discussed and pertinent positives are indicated in the HPI above.  All other systems are negative.  Review of Systems  Vital Signs: BP 96/74   Pulse 68   Temp 97.9 F (36.6 C) (Oral)   Resp 18   SpO2 98%   Physical Exam  Constitutional: She is oriented to person, place, and time. She appears well-developed and well-nourished.  No distress.  HENT:  Head: Normocephalic.  Mouth/Throat: Oropharynx is clear and moist.  Neck: Normal range of motion. No JVD present. No tracheal deviation present.  Cardiovascular: Normal rate, regular rhythm and normal heart sounds.   Pulmonary/Chest: Effort normal and breath sounds normal. No respiratory distress.  Abdominal: Soft. She exhibits no distension and no mass. There is no tenderness.  Neurological: She is alert and oriented to person, place, and time.  Skin: Skin is warm and dry.  Psychiatric: She has a normal mood and affect. Judgment normal.    Mallampati Score:  MD Evaluation Airway: WNL Heart: WNL Abdomen: WNL Chest/ Lungs: WNL ASA  Classification: 2 Mallampati/Airway Score: One  Imaging: Ct Abdomen Pelvis W Contrast  Result Date: 08/11/2016 CLINICAL DATA:  Bilateral abdominal pain radiating anteriorly. EXAM: CT ABDOMEN AND PELVIS WITH CONTRAST TECHNIQUE: Multidetector CT imaging of the abdomen and pelvis was performed using the standard protocol following bolus administration of intravenous contrast. CONTRAST:  168mL ISOVUE-300 IOPAMIDOL (ISOVUE-300) INJECTION 61% COMPARISON:  Ultrasound 01/28/2016.  CT 01/31/2011. FINDINGS: Lower chest: Linear scarring at the right base. Hepatobiliary: Numerous ill-defined low-density areas throughout the liver consistent with widespread metastatic disease. Previous cholecystectomy. Pancreas: Centrally necrotic mass lesion within the body of the pancreas measuring approximately 4 cm in diameter consistent with pancreatic carcinoma. Localized pancreatitis would be possible, but the liver findings make carcinoma much more likely. The mass abuts splenic and portal veins structures but does not visibly encase them. Spleen: Normal Adrenals/Urinary Tract: Adrenal glands are normal. Kidneys are normal. No cyst, mass, stone or hydronephrosis. Stomach/Bowel: No significant bowel finding. Vascular/Lymphatic: Aorta is unremarkable. IVC is normal.  No retroperitoneal adenopathy. No abnormal mesenteric nodes identified. Reproductive: Previous hysterectomy.  No pelvic mass. Other: No free fluid or air. Previous ventral hernia repair. 4.5 cm abnormality in the periumbilical region which could be residual hematoma. Possibility of  some peritoneal fluid or peritoneal carcinoma extending into this area seem remote. Musculoskeletal: Ordinary lower thoracic and lower lumbar degenerative changes. IMPRESSION: 4 cm centrally necrotic mass in the body the pancreas consistent with pancreatic carcinoma. Multiple up attic low densities consistent with metastatic disease. Previous ventral hernia repair. 4.5 cm region superficial to that which could be residual hematoma most likely. These results will be called to the ordering clinician or representative by the Radiologist Assistant, and communication documented in the PACS or zVision Dashboard. Electronically Signed   By: Nelson Chimes M.D.   On: 08/11/2016 16:17    Labs:  CBC:  Recent Labs  05/06/16 1708 05/07/16 0535 06/27/16 1030 08/07/16 1037  WBC 3.7* 4.5 6.1 6.6  HGB 15.2* 13.4 13.4 13.9  HCT 44.3 39.8 40.1 41.8  PLT 138* 133* 173 189.0    COAGS: No results for input(s): INR, APTT in the last 8760 hours.  BMP:  Recent Labs  05/06/16 1708 05/07/16 0535 05/22/16 1116 06/27/16 1030 08/07/16 1037  NA 140 145 141 140 141  K 3.7 4.2 4.4 4.1 4.4  CL 107 114* 102 106 104  CO2 25 26 30 27 30   GLUCOSE 120* 113* 99 132* 136*  BUN 13 12 11 10 10   CALCIUM 8.8* 8.3* 9.9 9.4 9.9  CREATININE 1.12* 0.99 0.94 0.83 0.94  GFRNONAA 53* >60  --  >60  --   GFRAA >60 >60  --  >60  --     LIVER FUNCTION TESTS:  Recent Labs  12/30/15 1017 05/06/16 1708 05/22/16 1116 08/07/16 1037  BILITOT 0.6 0.7 0.6 0.4  AST 20 42* 14 14  ALT 30 42 18 15  ALKPHOS 71 57 76 87  PROT 7.2 6.7 7.2 7.3  ALBUMIN 4.0 3.6 4.1 4.3    TUMOR MARKERS: No results for input(s): AFPTM, CEA, CA199, CHROMGRNA in the last  8760 hours.  Assessment and Plan: Liver lesions in setting of pancreatic mass For US guided liver lesion biopsy Labs pending Risks and Benefits discussed with the patient including, but not limited to bleeding, infection, damage to adjacent structures or low yield requiring additional tests. All of the patient's questions were answered, patient is agreeable to proceed. Consent signed and in chart.    Thank you for this interesting consult.  I greatly enjoyed meeting Heather Frederick and look forward to participating in their care.  A copy of this report was sent to the requesting provider on this date.  Electronically Signed: Ascencion Dike, PA-C 08/16/2016, 11:35 AM   I spent a total of 20 minutes in face to face in clinical consultation, greater than 50% of which was counseling/coordinating care for liver lesion biopsy.

## 2016-08-16 NOTE — Procedures (Signed)
Pre Procedure Dx: Liver lesion  Post Procedural Dx: Same  Technically successful US guided biopsy of indeterminate lesion within the right lobe of the liver.   EBL: None  No immediate complications.   Jay Lathaniel Legate, MD Pager #: [phone]    

## 2016-08-16 NOTE — Discharge Instructions (Signed)
Liver Biopsy, Care After These instructions give you information on caring for yourself after your procedure. Your doctor may also give you more specific instructions. Call your doctor if you have any problems or questions after your procedure. Follow these instructions at home:  Rest at home for 1-2 days or as told by your doctor.  Have someone stay with you for at least 24 hours.  Do not do these things in the first 24 hours:  Drive.  Use machinery.  Take care of other people.  Sign legal documents.  Take a bath or shower.  There are many different ways to close and cover a cut (incision). For example, a cut can be closed with stitches, skin glue, or adhesive strips. Follow your doctor's instructions on:  Taking care of your cut.  Changing and removing your bandage (dressing).  Removing whatever was used to close your cut.  Do not drink alcohol in the first week.  Do not lift more than 5 pounds or play contact sports for the first 2 weeks.  Take medicines only as told by your doctor.   Get your test results. Contact a doctor if:  A cut bleeds and leaves more than just a small spot of blood.  A cut is red, puffs up (swells), or hurts more than before.  Fluid or something else comes from a cut.  A cut smells bad.  You have a fever or chills. Get help right away if:  You have swelling, bloating, or pain in your belly (abdomen).  You get dizzy or faint.  You have a rash.  You feel sick to your stomach (nauseous) or throw up (vomit).  You have trouble breathing, feel short of breath, or feel faint.  Your chest hurts.  You have problems talking or seeing.  You have trouble balancing or moving your arms or legs. This information is not intended to replace advice given to you by your health care provider. Make sure you discuss any questions you have with your health care provider. Document Released: 12/21/2007 Document Revised: 08/19/2015 Document Reviewed:  05/09/2013 Elsevier Interactive Patient Education  2017 [place].  Moderate Conscious Sedation, Adult, Care After These instructions provide you with information about caring for yourself after your procedure. Your health care provider may also give you more specific instructions. Your treatment has been planned according to current medical practices, but problems sometimes occur. Call your health care provider if you have any problems or questions after your procedure. What can I expect after the procedure? After your procedure, it is common:  To feel sleepy for several hours.  To feel clumsy and have poor balance for several hours.  To have poor judgment for several hours.  To vomit if you eat too soon. Follow these instructions at home: For at least 24 hours after the procedure:    Do not:  Participate in activities where you could fall or become injured.  Drive.  Use heavy machinery.  Drink alcohol.  Take sleeping pills or medicines that cause drowsiness.  Make important decisions or sign legal documents.  Take care of children on your own.  Rest. Eating and drinking   Follow the diet recommended by your health care provider.  If you vomit:  Drink water, juice, or soup when you can drink without vomiting.  Make sure you have little or no nausea before eating solid foods. General instructions   Have a responsible adult stay with you until you are awake and alert.  Take  over-the-counter and prescription medicines only as told by your health care provider.  If you smoke, do not smoke without supervision.  Keep all follow-up visits as told by your health care provider. This is important. Contact a health care provider if:  You keep feeling nauseous or you keep vomiting.  You feel light-headed.  You develop a rash.  You have a fever. Get help right away if:  You have trouble breathing. This information is not intended to replace advice given to  you by your health care provider. Make sure you discuss any questions you have with your health care provider. Document Released: 01/01/2013 Document Revised: 08/16/2015 Document Reviewed: 07/03/2015 Elsevier Interactive Patient Education  2017 [employer].

## 2016-08-17 ENCOUNTER — Other Ambulatory Visit: Payer: Self-pay

## 2016-08-17 DIAGNOSIS — C259 Malignant neoplasm of pancreas, unspecified: Secondary | ICD-10-CM

## 2016-08-17 DIAGNOSIS — C787 Secondary malignant neoplasm of liver and intrahepatic bile duct: Secondary | ICD-10-CM

## 2016-08-18 ENCOUNTER — Telehealth: Payer: Self-pay | Admitting: Gastroenterology

## 2016-08-18 NOTE — Telephone Encounter (Signed)
Patient will come pick up a copy of the pathology

## 2016-08-22 ENCOUNTER — Telehealth: Payer: Self-pay | Admitting: Oncology

## 2016-08-22 NOTE — Telephone Encounter (Signed)
Appt has been scheduled for the pt to see Dr. Benay Spice on 5/31 at 1230pm per msg from [place]. Pt aware of the appt date and time. Aware to arrive 30 minutes early.

## 2016-08-24 ENCOUNTER — Telehealth: Payer: Self-pay | Admitting: Oncology

## 2016-08-24 ENCOUNTER — Other Ambulatory Visit: Payer: Self-pay | Admitting: *Deleted

## 2016-08-24 ENCOUNTER — Ambulatory Visit (HOSPITAL_BASED_OUTPATIENT_CLINIC_OR_DEPARTMENT_OTHER): Payer: 59 | Admitting: Oncology

## 2016-08-24 ENCOUNTER — Encounter: Payer: Self-pay | Admitting: *Deleted

## 2016-08-24 VITALS — BP 127/60 | HR 94 | Temp 98.1°F | Resp 20 | Ht 65.0 in | Wt 284.7 lb

## 2016-08-24 DIAGNOSIS — C259 Malignant neoplasm of pancreas, unspecified: Secondary | ICD-10-CM

## 2016-08-24 DIAGNOSIS — C787 Secondary malignant neoplasm of liver and intrahepatic bile duct: Principal | ICD-10-CM

## 2016-08-24 DIAGNOSIS — I10 Essential (primary) hypertension: Secondary | ICD-10-CM

## 2016-08-24 DIAGNOSIS — C251 Malignant neoplasm of body of pancreas: Secondary | ICD-10-CM

## 2016-08-24 MED ORDER — HYDROCODONE-ACETAMINOPHEN 5-325 MG PO TABS: 1.0000 | ORAL_TABLET | ORAL | 0 refills | Status: DC | PRN

## 2016-08-24 NOTE — Telephone Encounter (Signed)
Scheduled appt per 5/31 LOS - Gave patient AVS and calender per LOS . [facility] to contact patient with CT schedule. Ashley from IR scheduled patient for port placement - patient aware of date and time.

## 2016-08-24 NOTE — Progress Notes (Signed)
[facility] Patient Consult   Referring MD: Tippi Mccrae 61 y.o.  [birth year]    Reason for Referral: Pancreas cancer   HPI: Mr. Weingarten reports a history of pain at the right posterior lateral chest for several months. The pain occasionally radiates to the left side and to the anterior chest. The pain has progressed. She saw Dr. Sharlet Salina after a trial of Flexeril did not help. A CT of the abdomen and pelvis on 08/11/2016 revealed numerous ill-defined low-density lesions throughout the liver consistent with metastatic disease. A necrotic mass was noted in the body of the pancreas consistent with pancreas carcinoma. The mass abuts the splenic and portal veins without encasement.  She underwent ultrasound guided biopsy of a right liver lesion was performed on 08/16/2016. The pathology ([id number]) confirmed poorly differentiated adenocarcinoma. The morphology is compatible with pancreas cancer.   Past Medical History:  Diagnosis Date  . Allergy   . Anxiety   . Arthritis   . Benign essential HTN 09/23/2014  . Chronic kidney disease    uti  . Depression   . Dyslipidemia   . Elevated liver enzymes   . Family history of anesthesia complication    father has a severe hard time waking up  . Gallstones    a. Seen on CT 01/2014.  Marland Kitchen GERD (gastroesophageal reflux disease)   . Hard of hearing   . Hepatic steatosis   . History of frequent urinary tract infections   . Hyperlipidemia   . Hypertension   . Lateral epicondylitis of right elbow   . G2 P2    . Meralgia paresthetica of right side 10/02/2012   slight at 05/2014  . Migraine headache   . Obesity   . OSA (obstructive sleep apnea)    severe with AHI 37/hr now on CPAP at 18cm H2O  . Osteoarthritis   . Pneumonia    Feb 2018  . Raynaud disease    in feet per patient   . Sleep apnea    wears c-pap  . Urinary tract infection     Past Surgical History:  Procedure Laterality Date  . ABDOMINAL  HYSTERECTOMY  1/04   partial  . BLADDER SUSPENSION  6/10  . CARDIAC CATHETERIZATION    . carpel tunnel left/right  7/08, 8/08 Bilateral 8/08 and 7/08  . carpel tunnel rel Right 4/12  . CHOLECYSTECTOMY N/A 06/11/2014   Procedure: LAPAROSCOPIC CHOLECYSTECTOMY WITH ATTEMPTED INTRAOPERATIVE CHOLANGIOGRAM;  Surgeon: Jackolyn Confer, MD;  Location: [facility];  Service: General;  Laterality: N/A;  . COLONOSCOPY    . ERCP N/A 10/19/2014   Procedure: ENDOSCOPIC RETROGRADE CHOLANGIOPANCREATOGRAPHY (ERCP);  Surgeon: Ladene Artist, MD;  Location: [facility];  Service: Endoscopy;  Laterality: N/A;  . INCISIONAL HERNIA REPAIR N/A 06/30/2016   Procedure: LAPAROSCOPIC REPAIR OF INCISIONAL HERNIA WITH MESH;  Surgeon: Jackolyn Confer, MD;  Location: [facility];  Service: General;  Laterality: N/A;  . INSERTION OF MESH N/A 06/30/2016   Procedure: INSERTION OF MESH;  Surgeon: Jackolyn Confer, MD;  Location: [facility];  Service: General;  Laterality: N/A;  . JOINT REPLACEMENT    . KNEE ARTHROSCOPY Left 12/12  . KNEE ARTHROSCOPY Right 12/06  . LEFT HEART CATHETERIZATION WITH CORONARY ANGIOGRAM N/A 03/10/2014   Procedure: LEFT HEART CATHETERIZATION WITH CORONARY ANGIOGRAM;  Surgeon: Sinclair Grooms, MD;  [facility];  Service: Cardiovascular;  Laterality: N/A;  . PLANTAR FASCIA RELEASE Right 12/10  . radial tunnel release  right arm   . ROTATOR CUFF REPAIR Left 6/11  . tennis elbow release Right 7/04  . TOTAL KNEE ARTHROPLASTY Left 09/10/2012   Procedure: TOTAL KNEE ARTHROPLASTY- left;  Surgeon: Garald Balding, MD;  Location: [facility];  Service: Orthopedics;  Laterality: Left;  Left total knee arthroplasty    Medications: Reviewed  Allergies:  Allergies  Allergen Reactions  . Topamax [Topiramate] Other (See Comments)    :Stroke like symptoms  . Aleve [Naproxen Sodium] Hives    Has tolerated Voltaren topical as well as aspirin.  . Bee Venom Swelling  . Echinacea Hives  . Other Other (See Comments)      Feathers cause sinus congestion  . Sulfa Antibiotics Hives  . Advil [Ibuprofen] Hives    Has tolerated Voltaren topical as well as aspirin.    Family history: Her mother had melanoma. A maternal uncle had kidney cancer. No other family history of cancer.  Social History:   She lives in [place]. She is currently unemployed. 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 She does not use cigarettes. She reports rare alcohol use. No transfusion history. No risk factor for HIV or hepatitis.  ROS:   Positives include: Anorexia, 20 pound weight loss over the past 6 months, intermittent "indigestion", recurrent urinary tract infections, right posterior lateral chest pain, chronic mid low back pain  A complete ROS was otherwise negative.  Physical Exam:  Blood pressure 127/60, pulse 94, temperature 98.1 F (36.7 C), temperature source Oral, resp. rate 20, height _0  (1.651 m), weight 284 lb 11.2 oz (129.1 kg), SpO2 98 %.  HEENT: Oropharynx without visible mass, neck without mass Lungs: Clear bilaterally Cardiac: Regular rate and rhythm Abdomen: No hepatosplenomegaly, no mass, no apparent ascites, nontender  Vascular: No leg edema Lymph nodes: No cervical, supraclavicular, axillary, or inguinal nodes Neurologic: Alert and oriented, the motor exam appears intact in the upper and lower extremities Skin: No rash, multiple tattoos Musculoskeletal: No spine tenderness, no tenderness at the right posterior lateral chest wall. No mass   LAB:  CBC  Lab Results  Component Value Date   WBC 4.9 08/16/2016   HGB 14.7 08/16/2016   HCT 43.2 08/16/2016   MCV 86.9 08/16/2016   PLT 174 08/16/2016   NEUTROABS 2.2 08/16/2016        CMP     Component Value Date/Time   NA 141 08/07/2016 1037   K 4.4 08/07/2016 1037   CL 104 08/07/2016 1037   CO2 30 08/07/2016 1037   GLUCOSE 136 (H) 08/07/2016 1037   BUN 10 08/07/2016 1037   CREATININE 0.94 08/07/2016 1037    CALCIUM 9.9 08/07/2016 1037   PROT 7.3 08/07/2016 1037   ALBUMIN 4.3 08/07/2016 1037   AST 14 08/07/2016 1037   ALT 15 08/07/2016 1037   ALKPHOS 87 08/07/2016 1037   BILITOT 0.4 08/07/2016 1037   GFRNONAA >60 06/27/2016 1030   GFRAA >60 06/27/2016 1030     Imaging:  CT abdomen/pelvis 08/11/2016-images reviewed with Mrs. Tomson and her husband   Assessment/Plan:   1. Metastatic pancreas cancer  Pancreas body mass and liver metastases noted on CT abdomen/pelvis 08/11/2016  Ultrasound-guided biopsy of a right liver lesion 08/16/2016 revealed poorly differentiated adenocarcinoma consistent with pancreas cancer  2. Right posterior lateral chest pain-potentially related to a lateral liver metastasis  3.   Hypertension  4.   Sleep apnea  5.    Chronic low back pain  6.  Recurrent urinary tract infections  7.   Depression  8.   Migraine headaches   Disposition:   Ms. Lowman has been diagnosed with metastatic pancreas cancer. I discussed the prognosis and treatment options with Ms. Hilario and her husband. We reviewed the 08/11/2016 CT images. She understands no therapy will be curative.  We discussed the average response rates associated with gemcitabine/Abraxane and FOLFIRINOX chemotherapy. We discussed the treatment schema and expected toxicities associated with each of these regimens. She is concerned about the potential for nausea, malaise, and diarrhea associated with the FOLFIRINOX regimen. She is most comfortable proceeding with gemcitabine/Abraxane.  We also discussed the Can Stem study which randomizes patients to gemcitabine/Abraxane +/- an experimental drug (Napabucasin). Initial review of her chart indicates she is eligible for this study. She is interested in enrollment on the clinical trial. She met with a research nurse today.  We will prescribe hydrocodone to use as needed for pain. I will review the abdomen CT images in radiology to look for a more clear  explanation for her pain.   She will be referred for a chemotherapy teaching class and Port-A-Cath placement. We will check baseline laboratory studies including a CA 19-9.  Her ECOG performance status is measured at 1 today.  60 minutes were spent with the patient today. The majority of the time was used for counseling and coordination of care.  Donneta Romberg, MD  08/24/2016, 2:51 PM  I reviewed the 08/11/2016 CT images with a radiologist. There appears to be a right liver metastasis with involvement of the subcapsular space. This is the area where she is having pain.

## 2016-08-25 ENCOUNTER — Other Ambulatory Visit: Payer: Self-pay | Admitting: Student

## 2016-08-25 ENCOUNTER — Telehealth: Payer: Self-pay | Admitting: *Deleted

## 2016-08-25 ENCOUNTER — Telehealth: Payer: Self-pay | Admitting: Medical Oncology

## 2016-08-25 NOTE — Telephone Encounter (Addendum)
Hydrocodone-Pt states her insurance requires a prescription over ride for her to get 1 month supply of drug. Right now they will only give her a 7 day supply.   HYDROcodone-acetaminophen (NORCO/VICODIN) 5-325 MG tablet 60 tablet 0 08/24/2016    Sig - Route: Take 1 tablet by mouth every 4 (four) hours as needed for moderate pain. - Oral    Forwarded to managed care. She does have some from 2 months ago.

## 2016-08-25 NOTE — Telephone Encounter (Signed)
Spoke with Heather Frederick in [facility] pathology: Dr. Benay Spice is requesting [facility] testing on [phone]. She will forward request to pathologist.

## 2016-08-25 NOTE — Addendum Note (Signed)
Addendum  created 08/25/16 1245 by Effie Berkshire, MD   Sign clinical note

## 2016-08-25 NOTE — Telephone Encounter (Signed)
I called pt and told her that I sent to managed care to investigate and she has some hydrocodone to get her through the weekend.

## 2016-08-26 ENCOUNTER — Other Ambulatory Visit: Payer: Self-pay | Admitting: Internal Medicine

## 2016-08-28 ENCOUNTER — Other Ambulatory Visit: Payer: Self-pay | Admitting: Oncology

## 2016-08-28 ENCOUNTER — Ambulatory Visit (HOSPITAL_COMMUNITY)
Admission: RE | Admit: 2016-08-28 | Discharge: 2016-08-28 | Disposition: A | Discharge status: Home / Self Care | Payer: 59 | Source: Ambulatory Visit | Attending: Oncology | Admitting: Oncology

## 2016-08-28 ENCOUNTER — Encounter (HOSPITAL_COMMUNITY): Payer: Self-pay

## 2016-08-28 ENCOUNTER — Encounter: Payer: Self-pay | Admitting: Oncology

## 2016-08-28 ENCOUNTER — Encounter (HOSPITAL_COMMUNITY): Payer: Self-pay | Admitting: Emergency Medicine

## 2016-08-28 DIAGNOSIS — F419 Anxiety disorder, unspecified: Secondary | ICD-10-CM

## 2016-08-28 DIAGNOSIS — N189 Chronic kidney disease, unspecified: Secondary | ICD-10-CM | POA: Diagnosis not present

## 2016-08-28 DIAGNOSIS — M1990 Unspecified osteoarthritis, unspecified site: Secondary | ICD-10-CM | POA: Insufficient documentation

## 2016-08-28 DIAGNOSIS — I129 Hypertensive chronic kidney disease with stage 1 through stage 4 chronic kidney disease, or unspecified chronic kidney disease: Secondary | ICD-10-CM | POA: Diagnosis not present

## 2016-08-28 DIAGNOSIS — R21 Rash and other nonspecific skin eruption: Secondary | ICD-10-CM | POA: Diagnosis present

## 2016-08-28 DIAGNOSIS — C251 Malignant neoplasm of body of pancreas: Secondary | ICD-10-CM

## 2016-08-28 DIAGNOSIS — Z87440 Personal history of urinary (tract) infections: Secondary | ICD-10-CM

## 2016-08-28 DIAGNOSIS — G4733 Obstructive sleep apnea (adult) (pediatric): Secondary | ICD-10-CM

## 2016-08-28 DIAGNOSIS — G43909 Migraine, unspecified, not intractable, without status migrainosus: Secondary | ICD-10-CM | POA: Insufficient documentation

## 2016-08-28 DIAGNOSIS — Z96652 Presence of left artificial knee joint: Secondary | ICD-10-CM | POA: Diagnosis not present

## 2016-08-28 DIAGNOSIS — Z7982 Long term (current) use of aspirin: Secondary | ICD-10-CM | POA: Insufficient documentation

## 2016-08-28 DIAGNOSIS — Z79899 Other long term (current) drug therapy: Secondary | ICD-10-CM | POA: Insufficient documentation

## 2016-08-28 DIAGNOSIS — Z882 Allergy status to sulfonamides status: Secondary | ICD-10-CM

## 2016-08-28 DIAGNOSIS — I7300 Raynaud's syndrome without gangrene: Secondary | ICD-10-CM

## 2016-08-28 DIAGNOSIS — Z6841 Body Mass Index (BMI) 40.0 and over, adult: Secondary | ICD-10-CM

## 2016-08-28 DIAGNOSIS — K219 Gastro-esophageal reflux disease without esophagitis: Secondary | ICD-10-CM | POA: Insufficient documentation

## 2016-08-28 DIAGNOSIS — L249 Irritant contact dermatitis, unspecified cause: Secondary | ICD-10-CM | POA: Insufficient documentation

## 2016-08-28 DIAGNOSIS — C259 Malignant neoplasm of pancreas, unspecified: Secondary | ICD-10-CM

## 2016-08-28 DIAGNOSIS — E785 Hyperlipidemia, unspecified: Secondary | ICD-10-CM | POA: Insufficient documentation

## 2016-08-28 DIAGNOSIS — E669 Obesity, unspecified: Secondary | ICD-10-CM

## 2016-08-28 DIAGNOSIS — F329 Major depressive disorder, single episode, unspecified: Secondary | ICD-10-CM | POA: Insufficient documentation

## 2016-08-28 HISTORY — PX: IR FLUORO GUIDE PORT INSERTION RIGHT: IMG5741

## 2016-08-28 HISTORY — PX: IR US GUIDE VASC ACCESS RIGHT: IMG2390

## 2016-08-28 LAB — PROTIME-INR
INR: 1.11
PROTHROMBIN TIME: 14.4 s (ref 11.4–15.2)

## 2016-08-28 LAB — CBC
HCT: 40.3 % (ref 36.0–46.0)
HEMOGLOBIN: 13.4 g/dL (ref 12.0–15.0)
MCH: 29.2 pg (ref 26.0–34.0)
MCHC: 33.3 g/dL (ref 30.0–36.0)
MCV: 87.8 fL (ref 78.0–100.0)
Platelets: 212 10*3/uL (ref 150–400)
RBC: 4.59 MIL/uL (ref 3.87–5.11)
RDW: 13.6 % (ref 11.5–15.5)
WBC: 7.7 10*3/uL (ref 4.0–10.5)

## 2016-08-28 LAB — APTT: APTT: 28 s (ref 24–36)

## 2016-08-28 MED ORDER — CEFAZOLIN SODIUM-DEXTROSE 2-4 GM/100ML-% IV SOLN: 2.0000 g | Freq: Once | INTRAVENOUS | Status: DC

## 2016-08-28 MED ORDER — LIDOCAINE HCL (PF) 1 % IJ SOLN
INTRAMUSCULAR | Status: AC | PRN
  Administered 2016-08-28: 10 mL

## 2016-08-28 MED ORDER — CEFAZOLIN SODIUM-DEXTROSE 2-4 GM/100ML-% IV SOLN
INTRAVENOUS | Status: AC
  Administered 2016-08-28: 2000 mg
  Filled 2016-08-28: qty 100

## 2016-08-28 MED ORDER — FENTANYL CITRATE (PF) 100 MCG/2ML IJ SOLN
INTRAMUSCULAR | Status: AC | PRN
  Administered 2016-08-28 (×3): 50 ug via INTRAVENOUS

## 2016-08-28 MED ORDER — HEPARIN SOD (PORK) LOCK FLUSH 100 UNIT/ML IV SOLN
INTRAVENOUS | Status: AC | PRN
  Administered 2016-08-28: 500 [IU] via INTRAVENOUS

## 2016-08-28 MED ORDER — MIDAZOLAM HCL 2 MG/2ML IJ SOLN
INTRAMUSCULAR | Status: AC | PRN
  Administered 2016-08-28 (×3): 1 mg via INTRAVENOUS

## 2016-08-28 MED ORDER — HEPARIN SOD (PORK) LOCK FLUSH 100 UNIT/ML IV SOLN
INTRAVENOUS | Status: AC
  Filled 2016-08-28: qty 5

## 2016-08-28 MED ORDER — LIDOCAINE-EPINEPHRINE (PF) 1 %-1:200000 IJ SOLN
INTRAMUSCULAR | Status: AC | PRN
  Administered 2016-08-28: 10 mL

## 2016-08-28 MED ORDER — FENTANYL CITRATE (PF) 100 MCG/2ML IJ SOLN
INTRAMUSCULAR | Status: AC
  Filled 2016-08-28: qty 4

## 2016-08-28 MED ORDER — MIDAZOLAM HCL 2 MG/2ML IJ SOLN
INTRAMUSCULAR | Status: AC
  Filled 2016-08-28: qty 4

## 2016-08-28 MED ORDER — LIDOCAINE-EPINEPHRINE (PF) 1 %-1:200000 IJ SOLN
INTRAMUSCULAR | Status: AC
  Filled 2016-08-28: qty 30

## 2016-08-28 MED ORDER — NALOXONE HCL 0.4 MG/ML IJ SOLN
INTRAMUSCULAR | Status: AC
  Filled 2016-08-28: qty 1

## 2016-08-28 MED ORDER — SODIUM CHLORIDE 0.9 % IV SOLN
INTRAVENOUS | Status: DC
  Administered 2016-08-28: 09:00:00 via INTRAVENOUS

## 2016-08-28 MED ORDER — FLUMAZENIL 0.5 MG/5ML IV SOLN
INTRAVENOUS | Status: AC
  Filled 2016-08-28: qty 5

## 2016-08-28 MED ORDER — LIDOCAINE HCL 1 % IJ SOLN
INTRAMUSCULAR | Status: AC
  Filled 2016-08-28: qty 20

## 2016-08-28 NOTE — ED Triage Notes (Signed)
Patient reports having port placed in right chest today. Reports noticing red streaks and pain to site this evening. Denies fevers. Hx pancreatic cancer mets to liver

## 2016-08-28 NOTE — H&P (Signed)
Chief Complaint: Patient was seen in consultation today for biopsy of liver lesion at the request of Dr. Julieanne Manson  Referring Physician(s): Dr. Julieanne Manson  Supervising Physician: Dr. Barbie Banner  Patient Status: [facility]Out[facility]  History of Present Illness: Heather Frederick is a 61 y.o. female who has been recently diagnosed with metastatic pancreatic cancer. She is referred for port placement. PMHx, meds, imaging, allergies reviewed. Has been NPO this am. Husband at bedside  Past Medical History:  Diagnosis Date  . Allergy   . Anxiety   . Arthritis   . Benign essential HTN 09/23/2014  . Chronic kidney disease    uti  . Depression   . Dyslipidemia   . Elevated liver enzymes   . Family history of anesthesia complication    father has a severe hard time waking up  . Gallstones    a. Seen on CT 01/2014.  Marland Kitchen GERD (gastroesophageal reflux disease)   . Hard of hearing   . Hepatic steatosis   . History of frequent urinary tract infections   . Hyperlipidemia   . Hypertension   . Lateral epicondylitis of right elbow   . Mental disorder   . Meralgia paresthetica of right side 10/02/2012   slight at 05/2014  . Migraine headache   . Obesity   . OSA (obstructive sleep apnea)    severe with AHI 37/hr now on CPAP at 18cm H2O  . Osteoarthritis   . Pneumonia    Feb 2018  . Raynaud disease    in feet per patient   . Sleep apnea    wears c-pap  . Urinary tract infection     Past Surgical History:  Procedure Laterality Date  . ABDOMINAL HYSTERECTOMY  1/04   partial  . BLADDER SUSPENSION  6/10  . CARDIAC CATHETERIZATION    . carpel tunnel left/right  7/08, 8/08 Bilateral 8/08 and 7/08  . carpel tunnel rel Right 4/12  . CHOLECYSTECTOMY N/A 06/11/2014   Procedure: LAPAROSCOPIC CHOLECYSTECTOMY WITH ATTEMPTED INTRAOPERATIVE CHOLANGIOGRAM;  Surgeon: Jackolyn Confer, MD;  Location: [facility];  Service: General;  Laterality: N/A;  . COLONOSCOPY    . ERCP N/A 10/19/2014   Procedure:  ENDOSCOPIC RETROGRADE CHOLANGIOPANCREATOGRAPHY (ERCP);  Surgeon: Ladene Artist, MD;  Location: [facility];  Service: Endoscopy;  Laterality: N/A;  . INCISIONAL HERNIA REPAIR N/A 06/30/2016   Procedure: LAPAROSCOPIC REPAIR OF INCISIONAL HERNIA WITH MESH;  Surgeon: Jackolyn Confer, MD;  Location: [facility];  Service: General;  Laterality: N/A;  . INSERTION OF MESH N/A 06/30/2016   Procedure: INSERTION OF MESH;  Surgeon: Jackolyn Confer, MD;  Location: [facility];  Service: General;  Laterality: N/A;  . JOINT REPLACEMENT    . KNEE ARTHROSCOPY Left 12/12  . KNEE ARTHROSCOPY Right 12/06  . LEFT HEART CATHETERIZATION WITH CORONARY ANGIOGRAM N/A 03/10/2014   Procedure: LEFT HEART CATHETERIZATION WITH CORONARY ANGIOGRAM;  Surgeon: Sinclair Grooms, MD;  Location: [facility] CATH LAB;  Service: Cardiovascular;  Laterality: N/A;  . PLANTAR FASCIA RELEASE Right 12/10  . radial tunnel release     right arm   . ROTATOR CUFF REPAIR Left 6/11  . tennis elbow release Right 7/04  . TOTAL KNEE ARTHROPLASTY Left 09/10/2012   Procedure: TOTAL KNEE ARTHROPLASTY- left;  Surgeon: Garald Balding, MD;  Location: [facility];  Service: Orthopedics;  Laterality: Left;  Left total knee arthroplasty    Allergies: Topamax [topiramate]; Aleve [naproxen sodium]; Bee venom; Echinacea; Other; Sulfa antibiotics; and Advil [ibuprofen]  Medications: Prior  to Admission medications   Medication Sig Start Date End Date Taking? Authorizing Provider  acetaminophen (TYLENOL) 650 MG CR tablet Take 1,300 mg by mouth every 8 (eight) hours as needed for pain.    [provider]  amLODipine (NORVASC) 5 MG tablet Take 5 mg by mouth daily.  04/22/16   [provider]  aspirin EC 81 MG tablet Take 81 mg by mouth daily.    [provider]  Biotin 2500 MCG CAPS Take by mouth.    [provider]  cholestyramine (QUESTRAN) 4 g packet Take 4 g by mouth daily.     [provider]  citalopram (CELEXA) 20 MG tablet  TAKE ONE (1) TABLET BY MOUTH EVERY DAY 05/26/16   Hoyt Koch, MD  Cranberry-Vitamin C-Vitamin E (CRANBERRY PLUS VITAMIN C PO) Take 1 tablet by mouth daily. 4200 mg    [provider]  Cyanocobalamin (HM SUPER VITAMIN B12) 2500 MCG CHEW Chew 2,500 mg by mouth daily.    [provider]  eletriptan (RELPAX) 40 MG tablet Take 1 tablet (40 mg total) by mouth every 2 (two) hours as needed for migraine. 06/26/16   Dennie Bible, NP  Lactobacillus Rhamnosus, GG, (CULTURELLE PO) Take 1 capsule by mouth daily.    [provider]  loratadine (CLARITIN) 10 MG tablet Take 10-20 mg by mouth 2 (two) times daily as needed for allergies.     [provider]  Multiple Vitamin (MULTIVITAMIN WITH MINERALS) TABS Take 1 tablet by mouth daily.     [provider]  Multiple Vitamins-Minerals (OCUVITE ADULT 50+ PO) Take 1 tablet by mouth daily.    [provider]  nitrofurantoin (MACRODANTIN) 100 MG capsule Take 100 mg by mouth daily.  07/23/15   [provider]  nitroGLYCERIN (NITROSTAT) 0.4 MG SL tablet Place 1 tablet (0.4 mg total) under the tongue every 5 (five) minutes as needed for chest pain. 06/28/15   Belva Crome, MD  Omega-3 Fatty Acids (FISH OIL PO) Take 1,200 mg by mouth every morning.     [provider]  ondansetron (ZOFRAN) 8 MG tablet Take 1 tablet (8 mg total) by mouth every 6 (six) hours. 05/11/16   Hoyt Koch, MD  oxybutynin (DITROPAN-XL) 10 MG 24 hr tablet Take 10 mg by mouth every morning.  05/15/13   [provider]  pantoprazole (PROTONIX) 40 MG tablet TAKE ONE (1) TABLET BY MOUTH TWO (2) TIMES DAILY 02/24/16   Ladene Artist, MD  pregabalin (LYRICA) 200 MG capsule TAKE ONE (1) CAPSULE BY MOUTH EVERY MORNING 06/26/16   Dennie Bible, NP  ranitidine (ZANTAC) 300 MG tablet TAKE ONE TABLET BY MOUTH EVERY EVENING 01/20/16   Ladene Artist, MD  simvastatin (ZOCOR) 20 MG tablet TAKE ONE (1)  TABLET BY MOUTH EVERY DAY 02/29/16   Belva Crome, MD  spironolactone (ALDACTONE) 25 MG tablet TAKE ONE (1) TABLET BY MOUTH EVERY DAY 02/29/16   Belva Crome, MD     Family History  Problem Relation Age of Onset  . Hypertension Mother   . Stroke Mother   . Liver disease Mother        Abcess  . Hypertension Father   . Coronary artery disease Father   . Migraines Father   . Clotting disorder Father   . Kidney failure Brother   . Hypertension Brother   . Migraines Brother   . Migraines Daughter   . Breast cancer Other  Niece with breast cancer  . Colon cancer Paternal Grandmother   . Pancreatic cancer Paternal Grandmother   . Stomach cancer Paternal Grandmother   . Breast cancer Cousin   . Esophageal cancer Neg Hx   . Rectal cancer Neg Hx     Social History   Social History  . Marital status: Married    Spouse name: Remo Lipps  . Number of children: 2  . Years of education: 16   Occupational History  . [place] History Main Topics  . Smoking status: Never Smoker  . Smokeless tobacco: Never Used     Comment: Secondhand - from family growing up, workplace intermittently  . Alcohol use 0.0 oz/week     Comment: occasionally - intermittent, no more than twice a week  . Drug use: No  . Sexual activity: Not Asked   Other Topics Concern  . None   Social History Narrative   Patient is married Remo Lipps) and lives at home with her husband and child.   Patient has two children.   Patient has a college education.   Patient is right-handed.   Patient drinks 1-2 cup of coffee/tea daily.    Review of Systems: A 12 point ROS discussed and pertinent positives are indicated in the HPI above.  All other systems are negative.  Review of Systems  Vital Signs: BP (!) 119/59 (BP Location: Right Arm)   Pulse 68   Temp 98.3 F (36.8 C) (Oral)   Ht 5\' 5"  (1.651 m)   Wt 284 lb (128.8 kg)   SpO2 98%   BMI 47.26 kg/m   Physical Exam    Constitutional: She is oriented to person, place, and time. She appears well-developed and well-nourished. No distress.  HENT:  Head: Normocephalic.  Mouth/Throat: Oropharynx is clear and moist.  Neck: Normal range of motion. No JVD present. No tracheal deviation present.  Cardiovascular: Normal rate, regular rhythm and normal heart sounds.   Pulmonary/Chest: Effort normal and breath sounds normal. No respiratory distress.  Abdominal: Soft. She exhibits no distension and no mass. There is no tenderness.  Neurological: She is alert and oriented to person, place, and time.  Skin: Skin is warm and dry.  Psychiatric: She has a normal mood and affect. Judgment normal.    Mallampati Score:  MD Evaluation Airway: WNL Heart: WNL Abdomen: WNL Chest/ Lungs: WNL ASA  Classification: 2 Mallampati/Airway Score: One  Imaging: Ct Abdomen Pelvis W Contrast  Result Date: 08/11/2016 CLINICAL DATA:  Bilateral abdominal pain radiating anteriorly. EXAM: CT ABDOMEN AND PELVIS WITH CONTRAST TECHNIQUE: Multidetector CT imaging of the abdomen and pelvis was performed using the standard protocol following bolus administration of intravenous contrast. CONTRAST:  193mL ISOVUE-300 IOPAMIDOL (ISOVUE-300) INJECTION 61% COMPARISON:  Ultrasound 01/28/2016.  CT 01/31/2011. FINDINGS: Lower chest: Linear scarring at the right base. Hepatobiliary: Numerous ill-defined low-density areas throughout the liver consistent with widespread metastatic disease. Previous cholecystectomy. Pancreas: Centrally necrotic mass lesion within the body of the pancreas measuring approximately 4 cm in diameter consistent with pancreatic carcinoma. Localized pancreatitis would be possible, but the liver findings make carcinoma much more likely. The mass abuts splenic and portal veins structures but does not visibly encase them. Spleen: Normal Adrenals/Urinary Tract: Adrenal glands are normal. Kidneys are normal. No cyst, mass, stone or  hydronephrosis. Stomach/Bowel: No significant bowel finding. Vascular/Lymphatic: Aorta is unremarkable. IVC is normal. No retroperitoneal adenopathy. No abnormal mesenteric nodes identified. Reproductive: Previous hysterectomy.  No pelvic mass. Other: No free  fluid or air. Previous ventral hernia repair. 4.5 cm abnormality in the periumbilical region which could be residual hematoma. Possibility of some peritoneal fluid or peritoneal carcinoma extending into this area seem remote. Musculoskeletal: Ordinary lower thoracic and lower lumbar degenerative changes. IMPRESSION: 4 cm centrally necrotic mass in the body the pancreas consistent with pancreatic carcinoma. Multiple up attic low densities consistent with metastatic disease. Previous ventral hernia repair. 4.5 cm region superficial to that which could be residual hematoma most likely. These results will be called to the ordering clinician or representative by the Radiologist Assistant, and communication documented in the PACS or zVision Dashboard. Electronically Signed   By: Nelson Chimes M.D.   On: 08/11/2016 16:17   US Biopsy  Result Date: 08/16/2016 INDICATION: Concern for metastatic pancreatic cancer. Please perform ultrasound-guided liver lesion biopsy for tissue diagnostic purposes EXAM: ULTRASOUND GUIDED LIVER LESION BIOPSY COMPARISON:  CT the abdomen and pelvis - 08/11/2016 MEDICATIONS: None ANESTHESIA/SEDATION: Fentanyl 100 mcg IV; Versed 2 mg IV Total Moderate Sedation time: 24 minutes. The patient's level of consciousness and vital signs were monitored continuously by radiology nursing throughout the procedure under my direct supervision. COMPLICATIONS: None immediate. PROCEDURE: Informed written consent was obtained from the patient after a discussion of the risks, benefits and alternatives to treatment. The patient understands and consents the procedure. A timeout was performed prior to the initiation of the procedure. Ultrasound scanning was  performed of the right upper abdominal quadrant demonstrates several scattered hypoechoic hepatic lesions compatible with the findings seen on preceding contrast-enhanced abdominal CT. An approximately 1.5 x 1.2 cm hypoechoic lesion within the peripheral aspect of the right lobe of the liver was targeted for biopsy given location and sonographic window (representative image 11). The procedure was planned. The right upper abdominal quadrant was prepped and draped in the usual sterile fashion. The overlying soft tissues were anesthetized with 1% lidocaine with epinephrine. A 17 gauge, 6.8 cm co-axial needle was advanced into a peripheral aspect of the lesion. This was followed by 5 core biopsies with an 18 gauge core device under direct ultrasound guidance. The coaxial needle tract was embolized with a small amount of Gel-Foam slurry and superficial hemostasis was obtained with manual compression. Post procedural scanning was negative for definitive area of hemorrhage or additional complication. A dressing was placed. The patient tolerated the procedure well without immediate post procedural complication. IMPRESSION: Technically successful ultrasound guided core needle biopsy of indeterminate lesion within the peripheral aspect the right lobe of the liver. Electronically Signed   By: Sandi Mariscal M.D.   On: 08/16/2016 16:25    Labs:  CBC:  Recent Labs  06/27/16 1030 08/07/16 1037 08/16/16 1122 08/28/16 0823  WBC 6.1 6.6 4.9 7.7  HGB 13.4 13.9 14.7 13.4  HCT 40.1 41.8 43.2 40.3  PLT 173 189.0 174 212    COAGS:  Recent Labs  08/16/16 1122 08/28/16 0823  INR 1.00 1.11  APTT  --  28    BMP:  Recent Labs  05/06/16 1708 05/07/16 0535 05/22/16 1116 06/27/16 1030 08/07/16 1037  NA 140 145 141 140 141  K 3.7 4.2 4.4 4.1 4.4  CL 107 114* 102 106 104  CO2 25 26 30 27 30   GLUCOSE 120* 113* 99 132* 136*  BUN 13 12 11 10 10   CALCIUM 8.8* 8.3* 9.9 9.4 9.9  CREATININE 1.12* 0.99 0.94 0.83  0.94  GFRNONAA 53* >60  --  >60  --   GFRAA >60 >60  --  >  60  --     LIVER FUNCTION TESTS:  Recent Labs  12/30/15 1017 05/06/16 1708 05/22/16 1116 08/07/16 1037  BILITOT 0.6 0.7 0.6 0.4  AST 20 42* 14 14  ALT 30 42 18 15  ALKPHOS 71 57 76 87  PROT 7.2 6.7 7.2 7.3  ALBUMIN 4.0 3.6 4.1 4.3    TUMOR MARKERS: No results for input(s): AFPTM, CEA, CA199, CHROMGRNA in the last 8760 hours.  Assessment and Plan: Metastatic pancreatic cancer For [employer] ok Risks and Benefits discussed with the patient including, but not limited to bleeding, infection, pneumothorax, or fibrin sheath development and need for additional procedures. All of the patient's questions were answered, patient is agreeable to proceed. Consent signed and in chart.    Thank you for this interesting consult.  I greatly enjoyed meeting KAMERIA CANIZARES and look forward to participating in their care.  A copy of this report was sent to the requesting provider on this date.  Electronically Signed: Ascencion Dike, PA-C 08/28/2016, 9:24 AM   I spent a total of 20 minutes in face to face in clinical consultation, greater than 50% of which was counseling/coordinating care for port placement

## 2016-08-28 NOTE — Procedures (Signed)
RIJV PAC SVC RA EBL 0 Comp 0 

## 2016-08-28 NOTE — Discharge Instructions (Signed)
Implanted Port Insertion, Care After °This sheet gives you information about how to care for yourself after your procedure. Your health care provider may also give you more specific instructions. If you have problems or questions, contact your health care provider. °What can I expect after the procedure? °After your procedure, it is common to have: °· Discomfort at the port insertion site. °· Bruising on the skin over the port. This should improve over 3-4 days. ° °Follow these instructions at home: °Port care °· After your port is placed, you will get a manufacturer's information card. The card has information about your port. Keep this card with you at all times. °· Take care of the port as told by your health care provider. Ask your health care provider if you or a family member can get training for taking care of the port at home. A home health care nurse may also take care of the port. °· Make sure to remember what type of port you have. °Incision care °· Follow instructions from your health care provider about how to take care of your port insertion site. Make sure you: °? Wash your hands with soap and water before you change your bandage (dressing). If soap and water are not available, use hand sanitizer. °? Change your dressing as told by your health care provider. °? Leave stitches (sutures), skin glue, or adhesive strips in place. These skin closures may need to stay in place for 2 weeks or longer. If adhesive strip edges start to loosen and curl up, you may trim the loose edges. Do not remove adhesive strips completely unless your health care provider tells you to do that. °· Check your port insertion site every day for signs of infection. Check for: °? More redness, swelling, or pain. °? More fluid or blood. °? Warmth. °? Pus or a bad smell. °General instructions °· Do not take baths, swim, or use a hot tub until your health care provider approves. °· Do not lift anything that is heavier than 10 lb (4.5  kg) for a week, or as told by your health care provider. °· Ask your health care provider when it is okay to: °? Return to work or school. °? Resume usual physical activities or sports. °· Do not drive for 24 hours if you were given a medicine to help you relax (sedative). °· Take over-the-counter and prescription medicines only as told by your health care provider. °· Wear a medical alert bracelet in case of an emergency. This will tell any health care providers that you have a port. °· Keep all follow-up visits as told by your health care provider. This is important. °Contact a health care provider if: °· You cannot flush your port with saline as directed, or you cannot draw blood from the port. °· You have a fever or chills. °· You have more redness, swelling, or pain around your port insertion site. °· You have more fluid or blood coming from your port insertion site. °· Your port insertion site feels warm to the touch. °· You have pus or a bad smell coming from the port insertion site. °Get help right away if: °· You have chest pain or shortness of breath. °· You have bleeding from your port that you cannot control. °Summary °· Take care of the port as told by your health care provider. °· Change your dressing as told by your health care provider. °· Keep all follow-up visits as told by your health care provider. °  This information is not intended to replace advice given to you by your health care provider. Make sure you discuss any questions you have with your health care provider. °Document Released: 01/01/2013 Document Revised: 02/02/2016 Document Reviewed: 02/02/2016 °Elsevier Interactive Patient Education © 2017 Elsevier Inc[employer] °Moderate Conscious Sedation, Adult, Care After °These instructions provide you with information about caring for yourself after your procedure. Your health care provider may also give you more specific instructions. Your treatment has been planned according to current medical  practices, but problems sometimes occur. Call your health care provider if you have any problems or questions after your procedure. °What can I expect after the procedure? °After your procedure, it is common: °· To feel sleepy for several hours. °· To feel clumsy and have poor balance for several hours. °· To have poor judgment for several hours. °· To vomit if you eat too soon. ° °Follow these instructions at home: °For at least 24 hours after the procedure: ° °· Do not: °? Participate in activities where you could fall or become injured. °? Drive. °? Use heavy machinery. °? Drink alcohol. °? Take sleeping pills or medicines that cause drowsiness. °? Make important decisions or sign legal documents. °? Take care of children on your own. °· Rest. °Eating and drinking °· Follow the diet recommended by your health care provider. °· If you vomit: °? Drink water, juice, or soup when you can drink without vomiting. °? Make sure you have little or no nausea before eating solid foods. °General instructions °· Have a responsible adult stay with you until you are awake and alert. °· Take over-the-counter and prescription medicines only as told by your health care provider. °· If you smoke, do not smoke without supervision. °· Keep all follow-up visits as told by your health care provider. This is important. °Contact a health care provider if: °· You keep feeling nauseous or you keep vomiting. °· You feel light-headed. °· You develop a rash. °· You have a fever. °Get help right away if: °· You have trouble breathing. °This information is not intended to replace advice given to you by your health care provider. Make sure you discuss any questions you have with your health care provider. °Document Released: 01/01/2013 Document Revised: 08/16/2015 Document Reviewed: 07/03/2015 °Elsevier Interactive Patient Education © 2018 Elsevier Inc. ° °

## 2016-08-28 NOTE — Progress Notes (Signed)
Received staff message regarding Hydrocodone PA.  Called [facility] to receive PA request. Received via fax.  Submitted PA request through Cover My Meds.   Carman Ching (Key: [id number])   [facility] is reviewing your PA request. Typically an electronic response will be received within 72 hours. To check for an update later, open this request from your dashboard. You may close this dialog and return to your dashboard to perform other tasks.

## 2016-08-29 ENCOUNTER — Other Ambulatory Visit: Payer: Self-pay | Admitting: *Deleted

## 2016-08-29 ENCOUNTER — Emergency Department (HOSPITAL_COMMUNITY)
Admission: EM | Admit: 2016-08-29 | Discharge: 2016-08-29 | Disposition: A | Discharge status: Home / Self Care | Payer: 59 | Attending: Emergency Medicine | Admitting: Emergency Medicine

## 2016-08-29 ENCOUNTER — Encounter (HOSPITAL_COMMUNITY): Payer: Self-pay | Admitting: Emergency Medicine

## 2016-08-29 DIAGNOSIS — C259 Malignant neoplasm of pancreas, unspecified: Secondary | ICD-10-CM | POA: Insufficient documentation

## 2016-08-29 DIAGNOSIS — L249 Irritant contact dermatitis, unspecified cause: Secondary | ICD-10-CM

## 2016-08-29 DIAGNOSIS — C787 Secondary malignant neoplasm of liver and intrahepatic bile duct: Principal | ICD-10-CM

## 2016-08-29 HISTORY — DX: Malignant (primary) neoplasm, unspecified: C80.1

## 2016-08-29 MED ORDER — PROCHLORPERAZINE MALEATE 10 MG PO TABS: 10.0000 mg | ORAL_TABLET | Freq: Four times a day (QID) | ORAL | 2 refills | Status: DC | PRN

## 2016-08-29 MED ORDER — LIDOCAINE-PRILOCAINE 2.5-2.5 % EX CREA: 1.0000 "application " | TOPICAL_CREAM | CUTANEOUS | 2 refills | Status: DC | PRN

## 2016-08-29 NOTE — ED Notes (Signed)
Patient skin was marked with a skin marker showing where the streak was. Port site was redressed with Tegaderm and gauze per md orders.

## 2016-08-29 NOTE — Discharge Instructions (Signed)
You were seen today for redness at your port site. It is most consistent with a contact dermatitis may be related to tape or adhesives. It does not appear infected. If you note growing redness, fevers, increasing pain at the site you need to be reevaluated.

## 2016-08-29 NOTE — ED Notes (Signed)
Patient has red streaks over where her port a cath was put in today. Patient wants to make sure the port a cath is ok. Patient does not have any other complaint

## 2016-08-29 NOTE — ED Provider Notes (Signed)
[facility] DEPT Provider Note   CSN: [id number] Arrival date & time: 08/28/16  2134  By signing my name below, I, Heather Frederick, attest that this documentation has been prepared under the direction and in the presence of Horton, Barbette Hair, MD. Electronically Signed: Lise Auer, ED Scribe. 08/29/16. 1:35 AM.  History   Chief Complaint Chief Complaint  Patient presents with  . Vascular Access Problem   The history is provided by the patient. No language interpreter was used.    HPI Comments: Heather Frederick is a 61 y.o. female who presents to the Emergency Department complaining of sudden onset vascular access problem. Pt notes some red streaking to her right clavicle area near her port site which was placed on 6/4. She notes prior to returning home she did not notice any changes in the area. Pt has worn her bra but notes it was not in the same area. Denies itching, fever, or any other acute associated symptoms at this time.   Past Medical History:  Diagnosis Date  . Allergy   . Anxiety   . Arthritis   . Benign essential HTN 09/23/2014  . Cancer ([place])   . Chronic kidney disease    uti  . Depression   . Dyslipidemia   . Elevated liver enzymes   . Family history of anesthesia complication    father has a severe hard time waking up  . Gallstones    a. Seen on CT 01/2014.  Marland Kitchen GERD (gastroesophageal reflux disease)   . Hard of hearing   . Hepatic steatosis   . History of frequent urinary tract infections   . Hyperlipidemia   . Hypertension   . Lateral epicondylitis of right elbow   . Mental disorder   . Meralgia paresthetica of right side 10/02/2012   slight at 05/2014  . Migraine headache   . Obesity   . OSA (obstructive sleep apnea)    severe with AHI 37/hr now on CPAP at 18cm H2O  . Osteoarthritis   . Pneumonia    Feb 2018  . Raynaud disease    in feet per patient   . Sleep apnea    wears c-pap  . Urinary tract infection     Patient Active Problem List   Diagnosis Date Noted  . Mid back pain 07/07/2016  . Incisional hernia 06/30/2016  . Edema extremities 03/07/2016  . Pain of upper abdomen 01/27/2016  . Depression 01/28/2015  . Bile duct stone   . Elevated LFTs   . Benign essential HTN 09/23/2014  . Routine general medical examination at a health care facility 08/01/2014  . OSA (obstructive sleep apnea) 07/04/2014  . Dyslipidemia 03/09/2014  . Diastolic heart failure, stage B   . GERD (gastroesophageal reflux disease)   . Meralgia paresthetica of right side 10/02/2012  . Osteoarthritis of left knee 09/12/2012  . Migraines 09/12/2012  . Severe obesity (BMI >= 40) ([place]) 09/12/2012    Past Surgical History:  Procedure Laterality Date  . ABDOMINAL HYSTERECTOMY  1/04   partial  . BLADDER SUSPENSION  6/10  . CARDIAC CATHETERIZATION    . carpel tunnel left/right  7/08, 8/08 Bilateral 8/08 and 7/08  . carpel tunnel rel Right 4/12  . CHOLECYSTECTOMY N/A 06/11/2014   Procedure: LAPAROSCOPIC CHOLECYSTECTOMY WITH ATTEMPTED INTRAOPERATIVE CHOLANGIOGRAM;  Surgeon: Jackolyn Confer, MD;  Location: [facility];  Service: General;  Laterality: N/A;  . COLONOSCOPY    . ERCP N/A 10/19/2014   Procedure: ENDOSCOPIC RETROGRADE CHOLANGIOPANCREATOGRAPHY (ERCP);  Surgeon:  Ladene Artist, MD;  Location: [facility];  Service: Endoscopy;  Laterality: N/A;  . INCISIONAL HERNIA REPAIR N/A 06/30/2016   Procedure: LAPAROSCOPIC REPAIR OF INCISIONAL HERNIA WITH MESH;  Surgeon: Jackolyn Confer, MD;  Location: [facility];  Service: General;  Laterality: N/A;  . INSERTION OF MESH N/A 06/30/2016   Procedure: INSERTION OF MESH;  Surgeon: Jackolyn Confer, MD;  Location: [facility];  Service: General;  Laterality: N/A;  . IR FLUORO GUIDE PORT INSERTION RIGHT  08/28/2016  . IR US GUIDE VASC ACCESS RIGHT  08/28/2016  . JOINT REPLACEMENT    . KNEE ARTHROSCOPY Left 12/12  . KNEE ARTHROSCOPY Right 12/06  . LEFT HEART CATHETERIZATION WITH CORONARY ANGIOGRAM N/A 03/10/2014   Procedure: LEFT  HEART CATHETERIZATION WITH CORONARY ANGIOGRAM;  Surgeon: Sinclair Grooms, MD;  Location: [facility] CATH LAB;  Service: Cardiovascular;  Laterality: N/A;  . PLANTAR FASCIA RELEASE Right 12/10  . radial tunnel release     right arm   . ROTATOR CUFF REPAIR Left 6/11  . tennis elbow release Right 7/04  . TOTAL KNEE ARTHROPLASTY Left 09/10/2012   Procedure: TOTAL KNEE ARTHROPLASTY- left;  Surgeon: Garald Balding, MD;  Location: [facility];  Service: Orthopedics;  Laterality: Left;  Left total knee arthroplasty    OB History    No data available       Home Medications    Prior to Admission medications   Medication Sig Start Date End Date Taking? Authorizing Provider  acetaminophen (TYLENOL) 650 MG CR tablet Take 1,300 mg by mouth every 8 (eight) hours as needed for pain.   Yes [provider]  amLODipine (NORVASC) 5 MG tablet Take 5 mg by mouth daily.  04/22/16  Yes [provider]  aspirin EC 81 MG tablet Take 81 mg by mouth daily.   Yes [provider]  Biotin 2500 MCG CAPS Take 2,500 mcg by mouth daily.    Yes [provider]  cholestyramine (QUESTRAN) 4 g packet Take 4 g by mouth daily.    Yes [provider]  citalopram (CELEXA) 20 MG tablet TAKE ONE (1) TABLET BY MOUTH EVERY DAY 08/28/16  Yes Hoyt Koch, MD  Cranberry-Vitamin C-Vitamin E (CRANBERRY PLUS VITAMIN C PO) Take 1 tablet by mouth daily. 4200 mg   Yes [provider]  Cyanocobalamin (HM SUPER VITAMIN B12) 2500 MCG CHEW Chew 2,500 mg by mouth daily.   Yes [provider]  eletriptan (RELPAX) 40 MG tablet Take 1 tablet (40 mg total) by mouth every 2 (two) hours as needed for migraine. 06/26/16  Yes Dennie Bible, NP  HYDROcodone-acetaminophen (NORCO/VICODIN) 5-325 MG tablet Take 1 tablet by mouth every 4 (four) hours as needed for moderate pain. 08/24/16  Yes Ladell Pier, MD  Lactobacillus Rhamnosus, GG, (CULTURELLE PO) Take 1 capsule by mouth daily.   Yes  [provider]  loratadine (CLARITIN) 10 MG tablet Take 10-20 mg by mouth 2 (two) times daily as needed for allergies.    Yes [provider]  Multiple Vitamin (MULTIVITAMIN WITH MINERALS) TABS Take 1 tablet by mouth daily.    Yes [provider]  Multiple Vitamins-Minerals (OCUVITE ADULT 50+ PO) Take 1 tablet by mouth daily.   Yes [provider]  nitrofurantoin (MACRODANTIN) 100 MG capsule Take 100 mg by mouth daily.  07/23/15  Yes [provider]  nitroGLYCERIN (NITROSTAT) 0.4 MG SL tablet Place 1 tablet (0.4 mg total) under the tongue every 5 (five) minutes as  needed for chest pain. 06/28/15  Yes Belva Crome, MD  Omega-3 Fatty Acids (FISH OIL PO) Take 1,200 mg by mouth every morning.    Yes [provider]  oxybutynin (DITROPAN-XL) 10 MG 24 hr tablet Take 10 mg by mouth every morning.  05/15/13  Yes [provider]  pantoprazole (PROTONIX) 40 MG tablet TAKE ONE (1) TABLET BY MOUTH TWO (2) TIMES DAILY 02/24/16  Yes Ladene Artist, MD  pregabalin (LYRICA) 200 MG capsule TAKE ONE (1) CAPSULE BY MOUTH EVERY MORNING 06/26/16  Yes Dennie Bible, NP  ranitidine (ZANTAC) 300 MG tablet TAKE ONE TABLET BY MOUTH EVERY EVENING 01/20/16  Yes Ladene Artist, MD  simvastatin (ZOCOR) 20 MG tablet TAKE ONE (1) TABLET BY MOUTH EVERY DAY 02/29/16  Yes Belva Crome, MD  spironolactone (ALDACTONE) 25 MG tablet TAKE ONE (1) TABLET BY MOUTH EVERY DAY 02/29/16  Yes Belva Crome, MD    Family History Family History  Problem Relation Age of Onset  . Hypertension Mother   . Stroke Mother   . Liver disease Mother        Abcess  . Hypertension Father   . Coronary artery disease Father   . Migraines Father   . Clotting disorder Father   . Kidney failure Brother   . Hypertension Brother   . Migraines Brother   . Migraines Daughter   . Breast cancer Other        Niece with breast cancer  . Colon cancer Paternal Grandmother   . Pancreatic  cancer Paternal Grandmother   . Stomach cancer Paternal Grandmother   . Breast cancer Cousin   . Esophageal cancer Neg Hx   . Rectal cancer Neg Hx     Social History Social History  Substance Use Topics  . Smoking status: Never Smoker  . Smokeless tobacco: Never Used     Comment: Secondhand - from family growing up, workplace intermittently  . Alcohol use 0.0 oz/week     Comment: occasionally - intermittent, no more than twice a week     Allergies   Topamax [topiramate]; Aleve [naproxen sodium]; Bee venom; Echinacea; Other; Sulfa antibiotics; and Advil [ibuprofen]   Review of Systems Review of Systems  Constitutional: Negative for fever.  Skin: Positive for color change (red streaking, denies itching ) and rash.  All other systems reviewed and are negative.    Physical Exam Updated Vital Signs BP 108/64 (BP Location: Right Arm)   Pulse 63   Temp 98.1 F (36.7 C) (Oral)   Resp 18   Ht 5\' 5"  (1.651 m)   Wt 128.8 kg (284 lb)   SpO2 99%   BMI 47.26 kg/m   Physical Exam  Constitutional: She is oriented to person, place, and time. She appears well-developed and well-nourished.  Morbidly obese, No acute distress  HENT:  Head: Normocephalic and atraumatic.  Cardiovascular: Normal rate, regular rhythm and normal heart sounds.   No murmur heard. Pulmonary/Chest: Effort normal and breath sounds normal. No respiratory distress. She has no wheezes.  Significant tenderness over the chest wall, port palpated in right upper chest, sutures in place, no adjacent erythema, Dermabond overlying intact, additional sutures noted just superior to the right clavicle at the base of the neck, no adjacent erythema  Abdominal: Soft. Bowel sounds are normal.  Neurological: She is alert and oriented to person, place, and time.  Skin: Skin is warm and dry.  See comments above in chest Incisions appear clean dry and  intact with no adjacent erythema There is well-demarcated erythema extending  lateral to the port site and proximally, mild erythema, blanching slightly raised  Psychiatric: She has a normal mood and affect.  Nursing note and vitals reviewed.    ED Treatments / Results  DIAGNOSTIC STUDIES: Oxygen Saturation is 99% on RA, normal by my interpretation.   COORDINATION OF CARE: 1:22 AM-Discussed next steps with pt. Pt verbalized understanding and is agreeable with the plan.   Labs (all labs ordered are listed, but only abnormal results are displayed) Labs Reviewed - No data to display  EKG  EKG Interpretation None       Radiology Ir US Guide Vasc Access Right  Result Date: 08/28/2016 CLINICAL DATA:  Chronic kidney disease EXAM: TUNNEL POWER PORT PLACEMENT WITH SUBCUTANEOUS POCKET UTILIZING ULTRASOUND & FLOUROSCOPY FLUOROSCOPY TIME:  1 minutes and 12 seconds.  Fifteen mGy. MEDICATIONS AND MEDICAL HISTORY: Versed 3 mg, Fentanyl 150 mcg. Additional Medications: Ancef 2 g. Antibiotics were given within 2 hours of the procedure. ANESTHESIA/SEDATION: Moderate sedation time: 33 minutes. Nursing monitored the the patient during the procedure. PROCEDURE: After written informed consent was obtained, patient was placed in the supine position on angiographic table. The right neck and chest was prepped and draped in a sterile fashion. Lidocaine was utilized for local anesthesia. The right jugular vein was noted to be patent initially with ultrasound. Under sonographic guidance, a micropuncture needle was inserted into the right IJ vein (Ultrasound and fluoroscopic image documentation was performed). The needle was removed over an 018 wire which was exchanged for a Amplatz. This was advanced into the IVC. An 8-French dilator was advanced over the Amplatz. A small incision was made in the right upper chest over the anterior right second rib. Utilizing blunt dissection, a subcutaneous pocket was created in the caudal direction. The pocket was irrigated with a copious amount of sterile  normal saline. The port catheter was tunneled from the chest incision, and out the neck incision. The reservoir was inserted into the subcutaneous pocket and secured with two 3-0 Ethilon stitches. A peel-away sheath was advanced over the Amplatz wire. The port catheter was cut to measure length and inserted through the peel-away sheath. The peel-away sheath was removed. The chest incision was closed with 3-0 Vicryl interrupted stitches for the subcutaneous tissue and a running of 4-0 Vicryl subcuticular stitch for the skin. The neck incision was closed with a 4-0 Vicryl subcuticular stitch. Derma-bond was applied to both surgical incisions. The port reservoir was flushed and instilled with heparinized saline. No complications. FINDINGS: A right IJ vein Port-A-Cath is in place with its tip at the cavoatrial junction. COMPLICATIONS: None IMPRESSION: Successful 8 French right internal jugular vein power port placement with its tip at the SVC/RA junction. Electronically Signed   By: Marybelle Killings M.D.   On: 08/28/2016 13:30   Ir Fluoro Guide Port Insertion Right  Result Date: 08/28/2016 CLINICAL DATA:  Chronic kidney disease EXAM: TUNNEL POWER PORT PLACEMENT WITH SUBCUTANEOUS POCKET UTILIZING ULTRASOUND & FLOUROSCOPY FLUOROSCOPY TIME:  1 minutes and 12 seconds.  Fifteen mGy. MEDICATIONS AND MEDICAL HISTORY: Versed 3 mg, Fentanyl 150 mcg. Additional Medications: Ancef 2 g. Antibiotics were given within 2 hours of the procedure. ANESTHESIA/SEDATION: Moderate sedation time: 33 minutes. Nursing monitored the the patient during the procedure. PROCEDURE: After written informed consent was obtained, patient was placed in the supine position on angiographic table. The right neck and chest was prepped and draped in a sterile fashion. Lidocaine was utilized  for local anesthesia. The right jugular vein was noted to be patent initially with ultrasound. Under sonographic guidance, a micropuncture needle was inserted into the right  IJ vein (Ultrasound and fluoroscopic image documentation was performed). The needle was removed over an 018 wire which was exchanged for a Amplatz. This was advanced into the IVC. An 8-French dilator was advanced over the Amplatz. A small incision was made in the right upper chest over the anterior right second rib. Utilizing blunt dissection, a subcutaneous pocket was created in the caudal direction. The pocket was irrigated with a copious amount of sterile normal saline. The port catheter was tunneled from the chest incision, and out the neck incision. The reservoir was inserted into the subcutaneous pocket and secured with two 3-0 Ethilon stitches. A peel-away sheath was advanced over the Amplatz wire. The port catheter was cut to measure length and inserted through the peel-away sheath. The peel-away sheath was removed. The chest incision was closed with 3-0 Vicryl interrupted stitches for the subcutaneous tissue and a running of 4-0 Vicryl subcuticular stitch for the skin. The neck incision was closed with a 4-0 Vicryl subcuticular stitch. Derma-bond was applied to both surgical incisions. The port reservoir was flushed and instilled with heparinized saline. No complications. FINDINGS: A right IJ vein Port-A-Cath is in place with its tip at the cavoatrial junction. COMPLICATIONS: None IMPRESSION: Successful 8 French right internal jugular vein power port placement with its tip at the SVC/RA junction. Electronically Signed   By: Marybelle Killings M.D.   On: 08/28/2016 13:30    Procedures Procedures (including critical care time)  Medications Ordered in ED Medications - No data to display   Initial Impression / Assessment and Plan / ED Course  I have reviewed the triage vital signs and the nursing notes.  Pertinent labs & imaging results that were available during my care of the patient were reviewed by me and considered in my medical decision making (see chart for details).     Patient presents with  redness adjacent report site. No indication of infection at this time. It is well demarcated and most likely represents a contact dermatitis. Patient does not recall any take to the area but states that she was taken draped during surgery. There is no fluctuance or erythema immediately adjacent to the port site. Incisions are clean dry and intact. Will mark the redness and have patient monitor. She is otherwise systemically well appearing.  After history, exam, and medical workup I feel the patient has been appropriately medically screened and is safe for discharge home. Pertinent diagnoses were discussed with the patient. Patient was given return precautions.   Final Clinical Impressions(s) / ED Diagnoses   Final diagnoses:  Irritant contact dermatitis, unspecified trigger    New Prescriptions Discharge Medication List as of 08/29/2016  1:31 AM     I personally performed the services described in this documentation, which was scribed in my presence. The recorded information has been reviewed and is accurate.    Merryl Hacker, MD 08/29/16 [phone]

## 2016-08-31 ENCOUNTER — Other Ambulatory Visit (HOSPITAL_BASED_OUTPATIENT_CLINIC_OR_DEPARTMENT_OTHER): Payer: 59

## 2016-08-31 ENCOUNTER — Encounter: Payer: Self-pay | Admitting: *Deleted

## 2016-08-31 ENCOUNTER — Other Ambulatory Visit: Payer: 59

## 2016-08-31 ENCOUNTER — Encounter: Payer: Self-pay | Admitting: Oncology

## 2016-08-31 ENCOUNTER — Encounter: Payer: 59 | Admitting: *Deleted

## 2016-08-31 DIAGNOSIS — C251 Malignant neoplasm of body of pancreas: Secondary | ICD-10-CM

## 2016-08-31 LAB — CBC WITH DIFFERENTIAL/PLATELET
BASO%: 0.6 % (ref 0.0–2.0)
Basophils Absolute: 0 10*3/uL (ref 0.0–0.1)
EOS%: 2.1 % (ref 0.0–7.0)
Eosinophils Absolute: 0.2 10*3/uL (ref 0.0–0.5)
HCT: 42.1 % (ref 34.8–46.6)
HGB: 14.2 g/dL (ref 11.6–15.9)
LYMPH%: 23.6 % (ref 14.0–49.7)
MCH: 29.3 pg (ref 25.1–34.0)
MCHC: 33.7 g/dL (ref 31.5–36.0)
MCV: 86.9 fL (ref 79.5–101.0)
MONO#: 0.7 10*3/uL (ref 0.1–0.9)
MONO%: 9.5 % (ref 0.0–14.0)
NEUT#: 4.6 10*3/uL (ref 1.5–6.5)
NEUT%: 64.2 % (ref 38.4–76.8)
PLATELETS: 188 10*3/uL (ref 145–400)
RBC: 4.85 10*6/uL (ref 3.70–5.45)
RDW: 13.2 % (ref 11.2–14.5)
WBC: 7.1 10*3/uL (ref 3.9–10.3)
lymph#: 1.7 10*3/uL (ref 0.9–3.3)

## 2016-08-31 LAB — COMPREHENSIVE METABOLIC PANEL
ALT: 15 U/L (ref 0–55)
ANION GAP: 9 meq/L (ref 3–11)
AST: 15 U/L (ref 5–34)
Albumin: 3.8 g/dL (ref 3.5–5.0)
Alkaline Phosphatase: 105 U/L (ref 40–150)
BUN: 11.3 mg/dL (ref 7.0–26.0)
CHLORIDE: 105 meq/L (ref 98–109)
CO2: 28 meq/L (ref 22–29)
CREATININE: 1 mg/dL (ref 0.6–1.1)
Calcium: 10 mg/dL (ref 8.4–10.4)
EGFR: 66 mL/min/{1.73_m2} — ABNORMAL LOW (ref >90)
Glucose: 140 mg/dl (ref 70–140)
Potassium: 4.2 mEq/L (ref 3.5–5.1)
Sodium: 142 mEq/L (ref 136–145)
Total Bilirubin: 0.52 mg/dL (ref 0.20–1.20)
Total Protein: 7.5 g/dL (ref 6.4–8.3)

## 2016-08-31 NOTE — Progress Notes (Signed)
Called [employer] Caryl Pina) to check the status of Hydrocodone. She states it is still pending in insurance and should here something by the end of the week.

## 2016-09-01 LAB — CANCER ANTIGEN 19-9

## 2016-09-02 ENCOUNTER — Encounter: Payer: Self-pay | Admitting: Oncology

## 2016-09-04 ENCOUNTER — Encounter: Payer: Self-pay | Admitting: Oncology

## 2016-09-04 NOTE — Progress Notes (Signed)
Called [facility] pharmacy review(Susan) to follow up on status of PA for Hydrocodone. She states that it went to pharmacy review on 09/02/16 for quantity limit and it should be resolved today. [id number]

## 2016-09-06 ENCOUNTER — Encounter: Payer: Self-pay | Admitting: Oncology

## 2016-09-06 NOTE — Progress Notes (Signed)
Call [facility]) to follow up on status of Hydrocodone PA. She advised being that it is an opioid request for quantity limit and it would take longer for review. She did put it in as urgent and states it still could take about 3 more days. She states she ran a test claim and it went through stating no PA needed if not exceeding 12 per day. She advised me to contact the pharmacy and have them call the help desk to find out what the error is.  Called [facility] and spoke with Doren Custard the pharmacist and relayed this information. He attempted to run it and it did not go through and he received an error. He called the help desk and was advised it would go through for an initial fill of a 7 day supply without a PA and any after that would require a PA and gave me the number to initiate the PA. I asked Doren Custard if they could fill the 7 day supply for now and have the PA done after that. He states he would contact the patient to see if this would be ok.   If I have any other issues with obtaining the PA once the 7 day is up, I will forward to the RN to handle.

## 2016-09-08 ENCOUNTER — Other Ambulatory Visit: Payer: Self-pay | Admitting: *Deleted

## 2016-09-08 ENCOUNTER — Other Ambulatory Visit: Payer: Self-pay | Admitting: Oncology

## 2016-09-08 DIAGNOSIS — Z7189 Other specified counseling: Secondary | ICD-10-CM | POA: Insufficient documentation

## 2016-09-08 NOTE — Telephone Encounter (Signed)
Called pt. CBC and CMET are normal. CA19-9 is elevated as expected. This will give [place] something to monitor during treatment. Pt voiced understanding. 6/20 appointments reviewed.

## 2016-09-08 NOTE — Progress Notes (Signed)
START OFF PATHWAY REGIMEN - Pancreatic   [id number]:Nab-Paclitaxel (Abraxane(R)) 125 mg/m2 D1, 8, 15 + Gemcitabine 1,000 mg/m2 D1, 8, 15 q28 Days:   A cycle is every 28 days:     Nab-paclitaxel (protein bound)      Gemcitabine   **Always confirm dose/schedule in your pharmacy ordering system**    Patient Characteristics: Adenocarcinoma, Metastatic Disease, First Line, PS = 0, 1 Histology: Adenocarcinoma Current evidence of distant metastases? Yes AJCC T Category: Staged < 8th Ed. AJCC N Category: Staged < 8th Ed. AJCC M Category: Staged < 8th Ed. AJCC 8 Stage Grouping: Staged < 8th Ed. Line of therapy: First Line Would you be surprised if this patient died  in the next year? I would NOT be surprised if this patient died in the next year Intent of Therapy: Non-Curative / Palliative Intent, Discussed with Patient

## 2016-09-10 ENCOUNTER — Other Ambulatory Visit: Payer: Self-pay | Admitting: Oncology

## 2016-09-11 ENCOUNTER — Telehealth (INDEPENDENT_AMBULATORY_CARE_PROVIDER_SITE_OTHER): Payer: Self-pay | Admitting: Orthopaedic Surgery

## 2016-09-11 ENCOUNTER — Encounter: Payer: Self-pay | Admitting: Oncology

## 2016-09-11 NOTE — Progress Notes (Signed)
Received PA approval for Hydrocodone from [facility].  Called [facility]) to advise of approval. She thanked me and states they will run it through.

## 2016-09-11 NOTE — Telephone Encounter (Signed)
[place] for wheelchair prescription

## 2016-09-11 NOTE — Telephone Encounter (Signed)
OK 

## 2016-09-11 NOTE — Telephone Encounter (Signed)
Patient called this morning and is wanting to get a prescription for a wheelchair.  She has had a knee replacement and thinks her other knee will be needing a replacement on the other one and now she has been diagnosed with Stage 4 Pancreatic cancer.  She stated that her chemo starts on Wednesday and wanted to get a wheelchair soon.  She wanted to know if she needs to make an appointment with Dr. Durward Fortes or will he go ahead and write the prescription.  CB#[phone].  Thank you

## 2016-09-11 NOTE — Telephone Encounter (Signed)
Please advise 

## 2016-09-13 ENCOUNTER — Ambulatory Visit (HOSPITAL_BASED_OUTPATIENT_CLINIC_OR_DEPARTMENT_OTHER): Payer: 59

## 2016-09-13 ENCOUNTER — Ambulatory Visit (HOSPITAL_BASED_OUTPATIENT_CLINIC_OR_DEPARTMENT_OTHER): Payer: 59 | Admitting: Oncology

## 2016-09-13 VITALS — BP 101/74 | HR 76 | Temp 98.6°F | Resp 18 | Ht 65.0 in | Wt 280.9 lb

## 2016-09-13 DIAGNOSIS — Z5111 Encounter for antineoplastic chemotherapy: Secondary | ICD-10-CM | POA: Diagnosis not present

## 2016-09-13 DIAGNOSIS — C787 Secondary malignant neoplasm of liver and intrahepatic bile duct: Secondary | ICD-10-CM

## 2016-09-13 DIAGNOSIS — C259 Malignant neoplasm of pancreas, unspecified: Secondary | ICD-10-CM

## 2016-09-13 DIAGNOSIS — C251 Malignant neoplasm of body of pancreas: Secondary | ICD-10-CM

## 2016-09-13 DIAGNOSIS — I10 Essential (primary) hypertension: Secondary | ICD-10-CM

## 2016-09-13 MED ORDER — PROCHLORPERAZINE MALEATE 10 MG PO TABS
10.0000 mg | ORAL_TABLET | Freq: Once | ORAL | Status: AC
  Administered 2016-09-13: 10 mg via ORAL

## 2016-09-13 MED ORDER — HEPARIN SOD (PORK) LOCK FLUSH 100 UNIT/ML IV SOLN
500.0000 [IU] | Freq: Once | INTRAVENOUS | Status: AC | PRN
  Administered 2016-09-13: 500 [IU]
  Filled 2016-09-13: qty 5

## 2016-09-13 MED ORDER — SODIUM CHLORIDE 0.9% FLUSH
10.0000 mL | INTRAVENOUS | Status: DC | PRN
Start: 2016-09-13 — End: 2016-09-13
  Administered 2016-09-13: 10 mL
  Filled 2016-09-13: qty 10

## 2016-09-13 MED ORDER — PROCHLORPERAZINE MALEATE 10 MG PO TABS
ORAL_TABLET | ORAL | Status: AC
  Filled 2016-09-13: qty 1

## 2016-09-13 MED ORDER — SODIUM CHLORIDE 0.9 % IV SOLN
Freq: Once | INTRAVENOUS | Status: AC
  Administered 2016-09-13: 10:00:00 via INTRAVENOUS

## 2016-09-13 MED ORDER — PACLITAXEL PROTEIN-BOUND CHEMO INJECTION 100 MG
100.0000 mg/m2 | Freq: Once | INTRAVENOUS | Status: AC
  Administered 2016-09-13: 250 mg via INTRAVENOUS
  Filled 2016-09-13: qty 50

## 2016-09-13 MED ORDER — GEMCITABINE HCL CHEMO INJECTION 1 GM/26.3ML
800.0000 mg/m2 | Freq: Once | INTRAVENOUS | Status: AC
  Administered 2016-09-13: 1938 mg via INTRAVENOUS
  Filled 2016-09-13: qty 50.97

## 2016-09-13 NOTE — Progress Notes (Signed)
  [facility] OFFICE PROGRESS NOTE   Diagnosis: Pancreas cancer  INTERVAL HISTORY:   Ms. Hapke returns as scheduled. She continues to have pain in the right upper abdomen. She takes hydrocodone at night. No other complaint. She underwent Port-A-Cath placement 08/28/2016. She says the Port-A-Cath was sore for several days. This has improved.  She has attended a chemotherapy teaching class. Ms. Henry did not qualify for the CanStem study secondary to her increased BMI.  Objective:  Vital signs in last 24 hours:  Blood pressure 101/74, pulse 76, temperature 98.6 F (37 C), temperature source Oral, resp. rate 18, height 5\' 5"  (1.651 m), weight 280 lb 14.4 oz (127.4 kg), SpO2 98 %.   Resp: Lungs clear bilaterally Cardio: Regular rate and rhythm GI: No hepatosplenomegaly, mild tenderness at the right subcostal area Vascular: No leg edema    Portacath/PICC-without erythema  Lab Results:  Lab Results  Component Value Date   WBC 7.1 08/31/2016   HGB 14.2 08/31/2016   HCT 42.1 08/31/2016   MCV 86.9 08/31/2016   PLT 188 08/31/2016   NEUTROABS 4.6 08/31/2016    CMP     Component Value Date/Time   NA 142 08/31/2016 0938   K 4.2 08/31/2016 0938   CL 104 08/07/2016 1037   CO2 28 08/31/2016 0938   GLUCOSE 140 08/31/2016 0938   BUN 11.3 08/31/2016 0938   CREATININE 1.0 08/31/2016 0938   CALCIUM 10.0 08/31/2016 0938   PROT 7.5 08/31/2016 0938   ALBUMIN 3.8 08/31/2016 0938   AST 15 08/31/2016 0938   ALT 15 08/31/2016 0938   ALKPHOS 105 08/31/2016 0938   BILITOT 0.52 08/31/2016 0938   GFRNONAA >60 06/27/2016 1030   GFRAA >60 06/27/2016 1030    Medications: I have reviewed the patient's current medications.  Assessment/Plan: 1. Metastatic pancreas cancer ? Pancreas body mass and liver metastases noted on CT abdomen/pelvis 08/11/2016 ? Ultrasound-guided biopsy of a right liver lesion 08/16/2016 revealed poorly differentiated adenocarcinoma consistent with  pancreas cancer ? Cycle 1 gemcitabine/Abraxane 09/13/2016  2. Right posterior lateral chest pain-potentially related to a lateral liver metastasis  3.   Hypertension  4.   Sleep apnea  5.    Chronic low back pain  6.    Recurrent urinary tract infections  7.   Depression  8.   Migraine headaches  9.   Port-A-Cath placement 08/28/2016  Disposition:  Ms. Sek appears stable. She was deemed ineligible for the [facility]. The plan is to proceed with standard gemcitabine/Abraxane chemotherapy. She has attended a chemotherapy teaching class. We reviewed potential toxicities associated with this regimen.  We reviewed her medication list and eliminated nonessential medications.  She will return for week 2 chemotherapy 09/20/2016. She will be scheduled for an office visit prior to week #3 on 09/28/2016.  25 minutes were spent with the patient today. The majority of the time was used for counseling and coordination of care.  Donneta Romberg, MD  09/13/2016  9:28 AM

## 2016-09-13 NOTE — Patient Instructions (Signed)
[facility] Discharge Instructions for Patients Receiving Chemotherapy  Today you received the following chemotherapy agents Abraxane and Gemzar  To help prevent nausea and vomiting after your treatment, we encourage you to take your nausea medication as directed by your MD.   If you develop nausea and vomiting that is not controlled by your nausea medication, call the clinic.   BELOW ARE SYMPTOMS THAT SHOULD BE REPORTED IMMEDIATELY:  *FEVER GREATER THAN 100.5 F  *CHILLS WITH OR WITHOUT FEVER  NAUSEA AND VOMITING THAT IS NOT CONTROLLED WITH YOUR NAUSEA MEDICATION  *UNUSUAL SHORTNESS OF BREATH  *UNUSUAL BRUISING OR BLEEDING  TENDERNESS IN MOUTH AND THROAT WITH OR WITHOUT PRESENCE OF ULCERS  *URINARY PROBLEMS  *BOWEL PROBLEMS  UNUSUAL RASH Items with * indicate a potential emergency and should be followed up as soon as possible.  Feel free to call the clinic you have any questions or concerns. The clinic phone number is [phone].  Please show the [place] at check-in to the Emergency Department and triage nurse.  Nanoparticle Albumin-Bound Paclitaxel injection ABRAXANE What is this medicine? NANOPARTICLE ALBUMIN-BOUND PACLITAXEL (Na no PAHR ti kuhl al BYOO muhn-bound PAK li TAX el) is a chemotherapy drug. It targets fast dividing cells, like cancer cells, and causes these cells to die. This medicine is used to treat advanced breast cancer and advanced lung cancer. This medicine may be used for other purposes; ask your health care provider or pharmacist if you have questions. COMMON BRAND NAME(S): Abraxane What should I tell my health care provider before I take this medicine? They need to know if you have any of these conditions: -kidney disease -liver disease -low blood counts, like low platelets, red blood cells, or white blood cells -recent or ongoing radiation therapy -an unusual or allergic reaction to paclitaxel, albumin, other  chemotherapy, other medicines, foods, dyes, or preservatives -pregnant or trying to get pregnant -breast-feeding How should I use this medicine? This drug is given as an infusion into a vein. It is administered in a hospital or clinic by a specially trained health care professional. Talk to your pediatrician regarding the use of this medicine in children. Special care may be needed. Overdosage: If you think you have taken too much of this medicine contact a poison control center or emergency room at once. NOTE: This medicine is only for you. Do not share this medicine with others. What if I miss a dose? It is important not to miss your dose. Call your doctor or health care professional if you are unable to keep an appointment. What may interact with this medicine? -cyclosporine -diazepam -ketoconazole -medicines to increase blood counts like filgrastim, pegfilgrastim, sargramostim -other chemotherapy drugs like cisplatin, doxorubicin, epirubicin, etoposide, teniposide, vincristine -quinidine -testosterone -vaccines -verapamil Talk to your doctor or health care professional before taking any of these medicines: -acetaminophen -aspirin -ibuprofen -ketoprofen -naproxen This list may not describe all possible interactions. Give your health care provider a list of all the medicines, herbs, non-prescription drugs, or dietary supplements you use. Also tell them if you smoke, drink alcohol, or use illegal drugs. Some items may interact with your medicine. What should I watch for while using this medicine? Your condition will be monitored carefully while you are receiving this medicine. You will need important blood work done while you are taking this medicine. This medicine can cause serious allergic reactions. If you experience allergic reactions like skin rash, itching or hives, swelling of the face, lips, or tongue, tell your  doctor or health care professional right away. In some cases, you  may be given additional medicines to help with side effects. Follow all directions for their use. This drug may make you feel generally unwell. This is not uncommon, as chemotherapy can affect healthy cells as well as cancer cells. Report any side effects. Continue your course of treatment even though you feel ill unless your doctor tells you to stop. Call your doctor or health care professional for advice if you get a fever, chills or sore throat, or other symptoms of a cold or flu. Do not treat yourself. This drug decreases your body's ability to fight infections. Try to avoid being around people who are sick. This medicine may increase your risk to bruise or bleed. Call your doctor or health care professional if you notice any unusual bleeding. Be careful brushing and flossing your teeth or using a toothpick because you may get an infection or bleed more easily. If you have any dental work done, tell your dentist you are receiving this medicine. Avoid taking products that contain aspirin, acetaminophen, ibuprofen, naproxen, or ketoprofen unless instructed by your doctor. These medicines may hide a fever. Do not become pregnant while taking this medicine. Women should inform their doctor if they wish to become pregnant or think they might be pregnant. There is a potential for serious side effects to an unborn child. Talk to your health care professional or pharmacist for more information. Do not breast-feed an infant while taking this medicine. Men are advised not to father a child while receiving this medicine. What side effects may I notice from receiving this medicine? Side effects that you should report to your doctor or health care professional as soon as possible: -allergic reactions like skin rash, itching or hives, swelling of the face, lips, or tongue -low blood counts - This drug may decrease the number of white blood cells, red blood cells and platelets. You may be at increased risk for  infections and bleeding. -signs of infection - fever or chills, cough, sore throat, pain or difficulty passing urine -signs of decreased platelets or bleeding - bruising, pinpoint red spots on the skin, black, tarry stools, nosebleeds -signs of decreased red blood cells - unusually weak or tired, fainting spells, lightheadedness -breathing problems -changes in vision -chest pain -high or low blood pressure -mouth sores -nausea and vomiting -pain, swelling, redness or irritation at the injection site -pain, tingling, numbness in the hands or feet -slow or irregular heartbeat -swelling of the ankle, feet, hands Side effects that usually do not require medical attention (report to your doctor or health care professional if they continue or are bothersome): -aches, pains -changes in the color of fingernails -diarrhea -hair loss -loss of appetite This list may not describe all possible side effects. Call your doctor for medical advice about side effects. You may report side effects to FDA at 1-800-FDA-1088. Where should I keep my medicine? This drug is given in a hospital or clinic and will not be stored at home. NOTE: This sheet is a summary. It may not cover all possible information. If you have questions about this medicine, talk to your doctor, pharmacist, or health care provider.  2018 Elsevier/Gold Standard (2015-01-13 10:05:20)  Gemcitabine injection GEMZAR What is this medicine? GEMCITABINE (jem SIT a been) is a chemotherapy drug. This medicine is used to treat many types of cancer like breast cancer, lung cancer, pancreatic cancer, and ovarian cancer. This medicine may be used for other purposes; ask  your health care provider or pharmacist if you have questions. COMMON BRAND NAME(S): Gemzar What should I tell my health care provider before I take this medicine? They need to know if you have any of these conditions: -blood disorders -infection -kidney disease -liver  disease -recent or ongoing radiation therapy -an unusual or allergic reaction to gemcitabine, other chemotherapy, other medicines, foods, dyes, or preservatives -pregnant or trying to get pregnant -breast-feeding How should I use this medicine? This drug is given as an infusion into a vein. It is administered in a hospital or clinic by a specially trained health care professional. Talk to your pediatrician regarding the use of this medicine in children. Special care may be needed. Overdosage: If you think you have taken too much of this medicine contact a poison control center or emergency room at once. NOTE: This medicine is only for you. Do not share this medicine with others. What if I miss a dose? It is important not to miss your dose. Call your doctor or health care professional if you are unable to keep an appointment. What may interact with this medicine? -medicines to increase blood counts like filgrastim, pegfilgrastim, sargramostim -some other chemotherapy drugs like cisplatin -vaccines Talk to your doctor or health care professional before taking any of these medicines: -acetaminophen -aspirin -ibuprofen -ketoprofen -naproxen This list may not describe all possible interactions. Give your health care provider a list of all the medicines, herbs, non-prescription drugs, or dietary supplements you use. Also tell them if you smoke, drink alcohol, or use illegal drugs. Some items may interact with your medicine. What should I watch for while using this medicine? Visit your doctor for checks on your progress. This drug may make you feel generally unwell. This is not uncommon, as chemotherapy can affect healthy cells as well as cancer cells. Report any side effects. Continue your course of treatment even though you feel ill unless your doctor tells you to stop. In some cases, you may be given additional medicines to help with side effects. Follow all directions for their use. Call your  doctor or health care professional for advice if you get a fever, chills or sore throat, or other symptoms of a cold or flu. Do not treat yourself. This drug decreases your body's ability to fight infections. Try to avoid being around people who are sick. This medicine may increase your risk to bruise or bleed. Call your doctor or health care professional if you notice any unusual bleeding. Be careful brushing and flossing your teeth or using a toothpick because you may get an infection or bleed more easily. If you have any dental work done, tell your dentist you are receiving this medicine. Avoid taking products that contain aspirin, acetaminophen, ibuprofen, naproxen, or ketoprofen unless instructed by your doctor. These medicines may hide a fever. Women should inform their doctor if they wish to become pregnant or think they might be pregnant. There is a potential for serious side effects to an unborn child. Talk to your health care professional or pharmacist for more information. Do not breast-feed an infant while taking this medicine. What side effects may I notice from receiving this medicine? Side effects that you should report to your doctor or health care professional as soon as possible: -allergic reactions like skin rash, itching or hives, swelling of the face, lips, or tongue -low blood counts - this medicine may decrease the number of white blood cells, red blood cells and platelets. You may be at increased risk  for infections and bleeding. -signs of infection - fever or chills, cough, sore throat, pain or difficulty passing urine -signs of decreased platelets or bleeding - bruising, pinpoint red spots on the skin, black, tarry stools, blood in the urine -signs of decreased red blood cells - unusually weak or tired, fainting spells, lightheadedness -breathing problems -chest pain -mouth sores -nausea and vomiting -pain, swelling, redness at site where injected -pain, tingling, numbness  in the hands or feet -stomach pain -swelling of ankles, feet, hands -unusual bleeding Side effects that usually do not require medical attention (report to your doctor or health care professional if they continue or are bothersome): -constipation -diarrhea -hair loss -loss of appetite -stomach upset This list may not describe all possible side effects. Call your doctor for medical advice about side effects. You may report side effects to FDA at 1-800-FDA-1088. Where should I keep my medicine? This drug is given in a hospital or clinic and will not be stored at home. NOTE: This sheet is a summary. It may not cover all possible information. If you have questions about this medicine, talk to your doctor, pharmacist, or health care provider.  2018 Elsevier/Gold Standard (2007-07-23 18:45:54)

## 2016-09-13 NOTE — Progress Notes (Signed)
Pt tolerated 1st time chemo Abraxane/Gemzar. Reviewed after chemo care at home and medication management. Gave pt AVS and reviewed appts and labs. Pt and husband verbalized understanding. Follow up chemo call placed.

## 2016-09-15 ENCOUNTER — Telehealth: Payer: Self-pay | Admitting: *Deleted

## 2016-09-15 NOTE — Telephone Encounter (Signed)
Verbal order received and read back from Dr. Benay Spice for Tylenol for temperature, benadryl for rash, continue anti-emetics as ordered, call if symptoms progress.  Order given to Richardson Landry at this time.  Encouraged to hydrate with eight 8 oz water daily.  Avoid spicy, oily, acidic foods and lactose may not be tolerated either.  Benadryl may cause drowsiness.  With this call: "Her nausea is much better.  We have benadryl cream.  Can she use a topical cream on the rash?"  Can try topical as needed.  No further questions.

## 2016-09-15 NOTE — Telephone Encounter (Signed)
"  Heather Frederick calling for my wife.  She's having symptoms.  First chemotherapy was Wednesday (Gemzar/Abraxane).  She is sweating.  I've checked her temperature at 10:10 = 100.3.  Range since yesterday has been 100.0 to 100.3.  At 4:00 am she started complaining of itching and has a reddish rash looks like an orange peel  to her neck, shoulders and scalp.  Doesn't know when the nausea started.  She calls it 'being on the brink of nausea but has not thrown up'.  Took compazine last night at 9:30 pm.  Denies cough or any problems urinating.  Two normal bowel movements today.  Yesterday ate three small portion meals and drinks lots of water.  Today has not eaten or drank anything.       Return call to my cell number [phone]"  Will notify provider.

## 2016-09-17 ENCOUNTER — Other Ambulatory Visit: Payer: Self-pay | Admitting: Oncology

## 2016-09-18 ENCOUNTER — Telehealth: Payer: Self-pay | Admitting: *Deleted

## 2016-09-18 DIAGNOSIS — C787 Secondary malignant neoplasm of liver and intrahepatic bile duct: Secondary | ICD-10-CM

## 2016-09-18 DIAGNOSIS — C259 Malignant neoplasm of pancreas, unspecified: Secondary | ICD-10-CM

## 2016-09-18 MED ORDER — ONDANSETRON 8 MG PO TBDP: 8.0000 mg | ORAL_TABLET | Freq: Three times a day (TID) | ORAL | 1 refills | Status: DC | PRN

## 2016-09-18 MED ORDER — HYDROXYZINE HCL 25 MG PO TABS: 25.0000 mg | ORAL_TABLET | Freq: Three times a day (TID) | ORAL | 1 refills | Status: DC | PRN

## 2016-09-18 NOTE — Telephone Encounter (Signed)
Please call in AM 6/25, we will see her for persistent nausea

## 2016-09-18 NOTE — Telephone Encounter (Signed)
Spouse voicemail: "Concerned because the next treatment is Wednesday.  Heather Frederick is still having itching, dry heaves and tired."  Second call: "Taking nausea medicine, benadryl, drinking plenty of water but the smell of food makes her nauseated.  Has thrown up.  Taking sips of ensure but not eating.  No energy, light headed.  Stays in bed with my help to bathroom.  I do not think she'll be able to take another treatment.  She is starting to say she may not want to receive anymore treatments.  No shortness of breath just tires easily"   Verbal order received and read back from [facility] for Zofran 8 mg ODT and atarax 25 mg.  Order given to spouse at this time with instructions to call in am with update and work-in as needed.  Called [facility] with orders.

## 2016-09-19 NOTE — Telephone Encounter (Signed)
Called pt's husband, MD will see her prior to treatment on 6/27. He voiced appreciation for call, reports nausea is better with Zofran.

## 2016-09-19 NOTE — Telephone Encounter (Signed)
Telephone call to patients husband to check status following concerns yesterday. Patient did take newly prescribed nausea medicine. Patient was able to eat pancakes for breakfast this morning. She is feeling slightly better. Still very weak and some nausea present. The rash to her back and neck are unchanged. It still has a persistent itch. Patient was able to sleep last night after taking atarax. Husband is very concerned about having treatment again tomorrow at 76. He would like Dr. Benay Spice or Lattie Haw take a look at her prior to treatment to determine if she is able.

## 2016-09-20 ENCOUNTER — Ambulatory Visit (HOSPITAL_BASED_OUTPATIENT_CLINIC_OR_DEPARTMENT_OTHER): Payer: 59

## 2016-09-20 ENCOUNTER — Telehealth: Payer: Self-pay | Admitting: Oncology

## 2016-09-20 ENCOUNTER — Other Ambulatory Visit (HOSPITAL_BASED_OUTPATIENT_CLINIC_OR_DEPARTMENT_OTHER): Payer: 59

## 2016-09-20 ENCOUNTER — Other Ambulatory Visit: Payer: Self-pay | Admitting: *Deleted

## 2016-09-20 ENCOUNTER — Ambulatory Visit: Payer: 59

## 2016-09-20 ENCOUNTER — Ambulatory Visit (HOSPITAL_BASED_OUTPATIENT_CLINIC_OR_DEPARTMENT_OTHER): Payer: 59 | Admitting: Oncology

## 2016-09-20 VITALS — BP 118/74 | HR 63 | Temp 98.5°F | Resp 17 | Ht 65.0 in | Wt 270.3 lb

## 2016-09-20 DIAGNOSIS — I10 Essential (primary) hypertension: Secondary | ICD-10-CM | POA: Diagnosis not present

## 2016-09-20 DIAGNOSIS — Z452 Encounter for adjustment and management of vascular access device: Secondary | ICD-10-CM

## 2016-09-20 DIAGNOSIS — C787 Secondary malignant neoplasm of liver and intrahepatic bile duct: Principal | ICD-10-CM

## 2016-09-20 DIAGNOSIS — C251 Malignant neoplasm of body of pancreas: Secondary | ICD-10-CM

## 2016-09-20 DIAGNOSIS — C259 Malignant neoplasm of pancreas, unspecified: Secondary | ICD-10-CM

## 2016-09-20 DIAGNOSIS — R112 Nausea with vomiting, unspecified: Secondary | ICD-10-CM | POA: Diagnosis not present

## 2016-09-20 DIAGNOSIS — Z95828 Presence of other vascular implants and grafts: Secondary | ICD-10-CM

## 2016-09-20 LAB — CBC WITH DIFFERENTIAL/PLATELET
BASO%: 0.5 % (ref 0.0–2.0)
BASOS ABS: 0 10*3/uL (ref 0.0–0.1)
EOS%: 2.9 % (ref 0.0–7.0)
Eosinophils Absolute: 0.2 10*3/uL (ref 0.0–0.5)
HCT: 40.8 % (ref 34.8–46.6)
HEMOGLOBIN: 13.6 g/dL (ref 11.6–15.9)
LYMPH%: 22.9 % (ref 14.0–49.7)
MCH: 29.2 pg (ref 25.1–34.0)
MCHC: 33.4 g/dL (ref 31.5–36.0)
MCV: 87.4 fL (ref 79.5–101.0)
MONO#: 0.8 10*3/uL (ref 0.1–0.9)
MONO%: 13.4 % (ref 0.0–14.0)
NEUT#: 3.5 10*3/uL (ref 1.5–6.5)
NEUT%: 60.3 % (ref 38.4–76.8)
Platelets: 143 10*3/uL — ABNORMAL LOW (ref 145–400)
RBC: 4.66 10*6/uL (ref 3.70–5.45)
RDW: 12.9 % (ref 11.2–14.5)
WBC: 5.8 10*3/uL (ref 3.9–10.3)
lymph#: 1.3 10*3/uL (ref 0.9–3.3)

## 2016-09-20 MED ORDER — SODIUM CHLORIDE 0.9% FLUSH
10.0000 mL | INTRAVENOUS | Status: AC | PRN
  Administered 2016-09-20: 10 mL via INTRAVENOUS
  Filled 2016-09-20: qty 10

## 2016-09-20 MED ORDER — HEPARIN SOD (PORK) LOCK FLUSH 100 UNIT/ML IV SOLN
500.0000 [IU] | Freq: Once | INTRAVENOUS | Status: AC
  Administered 2016-09-20: 500 [IU] via INTRAVENOUS
  Filled 2016-09-20: qty 5

## 2016-09-20 MED ORDER — DEXAMETHASONE 2 MG PO TABS: ORAL_TABLET | ORAL | 0 refills | Status: DC

## 2016-09-20 MED ORDER — SODIUM CHLORIDE 0.9% FLUSH
10.0000 mL | INTRAVENOUS | Status: DC | PRN
  Administered 2016-09-20: 10 mL via INTRAVENOUS
  Filled 2016-09-20: qty 10

## 2016-09-20 MED ORDER — ALTEPLASE 2 MG IJ SOLR
2.0000 mg | Freq: Once | INTRAMUSCULAR | Status: AC | PRN
  Administered 2016-09-20: 2 mg
  Filled 2016-09-20: qty 2

## 2016-09-20 NOTE — Patient Instructions (Signed)
Implanted Port Home Guide An implanted port is a type of central line that is placed under the skin. Central lines are used to provide IV access when treatment or nutrition needs to be given through a person's veins. Implanted ports are used for long-term IV access. An implanted port may be placed because:  You need IV medicine that would be irritating to the small veins in your hands or arms.  You need long-term IV medicines, such as antibiotics.  You need IV nutrition for a long period.  You need frequent blood draws for lab tests.  You need dialysis.  Implanted ports are usually placed in the chest area, but they can also be placed in the upper arm, the abdomen, or the leg. An implanted port has two main parts:  Reservoir. The reservoir is round and will appear as a small, raised area under your skin. The reservoir is the part where a needle is inserted to give medicines or draw blood.  Catheter. The catheter is a thin, flexible tube that extends from the reservoir. The catheter is placed into a large vein. Medicine that is inserted into the reservoir goes into the catheter and then into the vein.  How will I care for my incision site? Do not get the incision site wet. Bathe or shower as directed by your health care provider. How is my port accessed? Special steps must be taken to access the port:  Before the port is accessed, a numbing cream can be placed on the skin. This helps numb the skin over the port site.  Your health care provider uses a sterile technique to access the port. ? Your health care provider must put on a mask and sterile gloves. ? The skin over your port is cleaned carefully with an antiseptic and allowed to dry. ? The port is gently pinched between sterile gloves, and a needle is inserted into the port.  Only "non-coring" port needles should be used to access the port. Once the port is accessed, a blood return should be checked. This helps ensure that the port  is in the vein and is not clogged.  If your port needs to remain accessed for a constant infusion, a clear (transparent) bandage will be placed over the needle site. The bandage and needle will need to be changed every week, or as directed by your health care provider.  Keep the bandage covering the needle clean and dry. Do not get it wet. Follow your health care provider's instructions on how to take a shower or bath while the port is accessed.  If your port does not need to stay accessed, no bandage is needed over the port.  What is flushing? Flushing helps keep the port from getting clogged. Follow your health care provider's instructions on how and when to flush the port. Ports are usually flushed with saline solution or a medicine called heparin. The need for flushing will depend on how the port is used.  If the port is used for intermittent medicines or blood draws, the port will need to be flushed: ? After medicines have been given. ? After blood has been drawn. ? As part of routine maintenance.  If a constant infusion is running, the port may not need to be flushed.  How long will my port stay implanted? The port can stay in for as long as your health care provider thinks it is needed. When it is time for the port to come out, surgery will be   done to remove it. The procedure is similar to the one performed when the port was put in. When should I seek immediate medical care? When you have an implanted port, you should seek immediate medical care if:  You notice a bad smell coming from the incision site.  You have swelling, redness, or drainage at the incision site.  You have more swelling or pain at the port site or the surrounding area.  You have a fever that is not controlled with medicine.  This information is not intended to replace advice given to you by your health care provider. Make sure you discuss any questions you have with your health care provider. Document  Released: 03/13/2005 Document Revised: 08/19/2015 Document Reviewed: 11/18/2012 Elsevier Interactive Patient Education  2017 [employer].  

## 2016-09-20 NOTE — Telephone Encounter (Signed)
Gave patient AVS and calender - appts already scheduled - no additional appts added.

## 2016-09-20 NOTE — Progress Notes (Signed)
  [facility] OFFICE PROGRESS NOTE   Diagnosis: Pancreas cancer  INTERVAL HISTORY:   Heather Frederick returns as scheduled. She completed a first treatment with gemcitabine/Abraxane 09/13/2016. She developed nausea on the day following chemotherapy. She reports dry heaves and nausea lasting until 09/18/2016. Zofran helped. She developed a rash the day following chemotherapy extending from the head to the legs. The rash was pruritic. Hydroxyzine held. The rash is now fading. She has malaise  Objective:  Vital signs in last 24 hours:  Blood pressure 118/74, pulse 63, temperature 98.5 F (36.9 C), temperature source Oral, resp. rate 17, height 5\' 5"  (1.651 m), weight 270 lb 4.8 oz (122.6 kg), SpO2 98 %.    HEENT: No thrush or ulcers Resp: Lungs clear bilaterally Cardio: Regular rate and rhythm GI: No hepatomegaly, nontender Vascular: No leg edema  Skin: Faint erythematous rash at the lower back and thighs.   Portacath/PICC-without erythema  Lab Results:  Lab Results  Component Value Date   WBC 5.8 09/20/2016   HGB 13.6 09/20/2016   HCT 40.8 09/20/2016   MCV 87.4 09/20/2016   PLT 143 (L) 09/20/2016   NEUTROABS 3.5 09/20/2016    CMP     Component Value Date/Time   NA 142 08/31/2016 0938   K 4.2 08/31/2016 0938   CL 104 08/07/2016 1037   CO2 28 08/31/2016 0938   GLUCOSE 140 08/31/2016 0938   BUN 11.3 08/31/2016 0938   CREATININE 1.0 08/31/2016 0938   CALCIUM 10.0 08/31/2016 0938   PROT 7.5 08/31/2016 0938   ALBUMIN 3.8 08/31/2016 0938   AST 15 08/31/2016 0938   ALT 15 08/31/2016 0938   ALKPHOS 105 08/31/2016 0938   BILITOT 0.52 08/31/2016 0938   GFRNONAA >60 06/27/2016 1030   GFRAA >60 06/27/2016 1030     Medications: I have reviewed the patient's current medications.  Assessment/Plan: 1. Metastatic pancreas cancer ? Pancreas body mass and liver metastases noted on CT abdomen/pelvis 08/11/2016 ? Ultrasound-guided biopsy of a right liver lesion  08/16/2016 revealed poorly differentiated adenocarcinoma consistent with pancreas cancer ? Cycle 1 gemcitabine/Abraxane 09/13/2016  2. Right posterior lateral chest pain-potentially related to a lateral liver metastasis  3. Hypertension  4. Sleep apnea  5. Chronic low back pain  6. Recurrent urinary tract infections  7. Depression  8. Migraine headaches  9.   Port-A-Cath placement 08/28/2016  10. Rash following cycle 1 gemcitabine/Abraxane-drug rash?  11.  Nausea/vomiting following cycle 1 gemcitabine/Abraxane-the antiemetic regimen will be adjusted with cycle 2  Disposition:  [name] experienced significant nausea/vomiting and a rash following the first treatment with gemcitabine/Abraxane. She continues to complain of malaise. The second cycle of chemotherapy will be held today. She will be scheduled for the next treatment with gemcitabine/Abraxane on 09/29/2016. She will be premedicated with Decadron and the antiemetic regimen will be adjusted with the next treatment.   25 minutes were spent with the patient today. The majority of the time was used for counseling and coordination of care. Donneta Romberg, MD  09/20/2016  7:05 PM

## 2016-09-20 NOTE — Progress Notes (Signed)
Port-A-Cath accessed, flushes well with no swelling or pain, no blood return noted.  No pain noted upon flushing. Cath-Flo instilled per policy. Site labeled. Will monitor for blood return.

## 2016-09-23 ENCOUNTER — Other Ambulatory Visit: Payer: Self-pay | Admitting: Gastroenterology

## 2016-09-24 ENCOUNTER — Other Ambulatory Visit: Payer: Self-pay | Admitting: Oncology

## 2016-09-25 ENCOUNTER — Encounter (HOSPITAL_COMMUNITY): Payer: Self-pay

## 2016-09-29 ENCOUNTER — Telehealth: Payer: Self-pay

## 2016-09-29 ENCOUNTER — Telehealth: Payer: Self-pay | Admitting: *Deleted

## 2016-09-29 ENCOUNTER — Other Ambulatory Visit (HOSPITAL_BASED_OUTPATIENT_CLINIC_OR_DEPARTMENT_OTHER): Payer: 59

## 2016-09-29 ENCOUNTER — Ambulatory Visit (HOSPITAL_BASED_OUTPATIENT_CLINIC_OR_DEPARTMENT_OTHER): Payer: 59 | Admitting: Nurse Practitioner

## 2016-09-29 ENCOUNTER — Ambulatory Visit (HOSPITAL_BASED_OUTPATIENT_CLINIC_OR_DEPARTMENT_OTHER): Payer: 59

## 2016-09-29 ENCOUNTER — Ambulatory Visit: Payer: 59

## 2016-09-29 VITALS — BP 137/59 | HR 92 | Temp 97.3°F | Resp 18 | Ht 65.0 in | Wt 281.7 lb

## 2016-09-29 VITALS — BP 137/59 | HR 92 | Temp 97.3°F | Resp 18 | Ht 65.0 in | Wt 282.3 lb

## 2016-09-29 DIAGNOSIS — C259 Malignant neoplasm of pancreas, unspecified: Secondary | ICD-10-CM

## 2016-09-29 DIAGNOSIS — C787 Secondary malignant neoplasm of liver and intrahepatic bile duct: Principal | ICD-10-CM

## 2016-09-29 DIAGNOSIS — I10 Essential (primary) hypertension: Secondary | ICD-10-CM

## 2016-09-29 DIAGNOSIS — C251 Malignant neoplasm of body of pancreas: Secondary | ICD-10-CM | POA: Diagnosis not present

## 2016-09-29 DIAGNOSIS — R739 Hyperglycemia, unspecified: Secondary | ICD-10-CM

## 2016-09-29 DIAGNOSIS — M545 Low back pain: Secondary | ICD-10-CM | POA: Diagnosis not present

## 2016-09-29 DIAGNOSIS — Z5111 Encounter for antineoplastic chemotherapy: Secondary | ICD-10-CM | POA: Diagnosis not present

## 2016-09-29 DIAGNOSIS — G8929 Other chronic pain: Secondary | ICD-10-CM | POA: Diagnosis not present

## 2016-09-29 LAB — CBC WITH DIFFERENTIAL/PLATELET
BASO%: 0.1 % (ref 0.0–2.0)
Basophils Absolute: 0 10*3/uL (ref 0.0–0.1)
EOS%: 0 % (ref 0.0–7.0)
Eosinophils Absolute: 0 10*3/uL (ref 0.0–0.5)
HEMATOCRIT: 37.4 % (ref 34.8–46.6)
HGB: 12.3 g/dL (ref 11.6–15.9)
LYMPH%: 8 % — AB (ref 14.0–49.7)
MCH: 29.1 pg (ref 25.1–34.0)
MCHC: 32.9 g/dL (ref 31.5–36.0)
MCV: 88.4 fL (ref 79.5–101.0)
MONO#: 0.4 10*3/uL (ref 0.1–0.9)
MONO%: 2.6 % (ref 0.0–14.0)
NEUT#: 12.1 10*3/uL — ABNORMAL HIGH (ref 1.5–6.5)
NEUT%: 89.3 % — AB (ref 38.4–76.8)
PLATELETS: 315 10*3/uL (ref 145–400)
RBC: 4.23 10*6/uL (ref 3.70–5.45)
RDW: 13.7 % (ref 11.2–14.5)
WBC: 13.6 10*3/uL — ABNORMAL HIGH (ref 3.9–10.3)
lymph#: 1.1 10*3/uL (ref 0.9–3.3)
nRBC: 0 % (ref 0–0)

## 2016-09-29 LAB — COMPREHENSIVE METABOLIC PANEL
ALT: 39 U/L (ref 0–55)
ANION GAP: 12 meq/L — AB (ref 3–11)
AST: 17 U/L (ref 5–34)
Albumin: 3.4 g/dL — ABNORMAL LOW (ref 3.5–5.0)
Alkaline Phosphatase: 132 U/L (ref 40–150)
BUN: 12.3 mg/dL (ref 7.0–26.0)
CALCIUM: 9.8 mg/dL (ref 8.4–10.4)
CHLORIDE: 100 meq/L (ref 98–109)
CO2: 22 meq/L (ref 22–29)
Creatinine: 1.1 mg/dL (ref 0.6–1.1)
EGFR: 53 mL/min/{1.73_m2} — ABNORMAL LOW (ref >90)
Glucose: 495 mg/dl — ABNORMAL HIGH (ref 70–140)
POTASSIUM: 4.4 meq/L (ref 3.5–5.1)
Sodium: 134 mEq/L — ABNORMAL LOW (ref 136–145)
Total Bilirubin: 0.26 mg/dL (ref 0.20–1.20)
Total Protein: 7.2 g/dL (ref 6.4–8.3)

## 2016-09-29 LAB — WHOLE BLOOD GLUCOSE
GLUCOSE: 437 mg/dL — AB (ref 70–100)
GLUCOSE: 365 mg/dL — AB (ref 70–100)
HRS PC: 3 h
HRS PC: 4 h

## 2016-09-29 MED ORDER — HEPARIN SOD (PORK) LOCK FLUSH 100 UNIT/ML IV SOLN
500.0000 [IU] | Freq: Once | INTRAVENOUS | Status: AC | PRN
  Administered 2016-09-29: 500 [IU]
  Filled 2016-09-29: qty 5

## 2016-09-29 MED ORDER — INSULIN REGULAR HUMAN 100 UNIT/ML IJ SOLN
10.0000 [IU] | Freq: Once | INTRAMUSCULAR | Status: AC
  Administered 2016-09-29: 10 [IU] via SUBCUTANEOUS
  Filled 2016-09-29: qty 0.1

## 2016-09-29 MED ORDER — SODIUM CHLORIDE 0.9% FLUSH
10.0000 mL | Freq: Once | INTRAVENOUS | Status: AC
  Administered 2016-09-29: 10 mL via INTRAVENOUS
  Filled 2016-09-29: qty 10

## 2016-09-29 MED ORDER — PACLITAXEL PROTEIN-BOUND CHEMO INJECTION 100 MG
100.0000 mg/m2 | Freq: Once | INTRAVENOUS | Status: AC
  Administered 2016-09-29: 250 mg via INTRAVENOUS
  Filled 2016-09-29: qty 50

## 2016-09-29 MED ORDER — DEXAMETHASONE SODIUM PHOSPHATE 100 MG/10ML IJ SOLN
10.0000 mg | Freq: Once | INTRAMUSCULAR | Status: DC
  Filled 2016-09-29: qty 1

## 2016-09-29 MED ORDER — PALONOSETRON HCL INJECTION 0.25 MG/5ML
0.2500 mg | Freq: Once | INTRAVENOUS | Status: AC
  Administered 2016-09-29: 0.25 mg via INTRAVENOUS

## 2016-09-29 MED ORDER — SODIUM CHLORIDE 0.9% FLUSH
10.0000 mL | INTRAVENOUS | Status: DC | PRN
  Administered 2016-09-29: 10 mL
  Filled 2016-09-29: qty 10

## 2016-09-29 MED ORDER — DEXAMETHASONE SODIUM PHOSPHATE 10 MG/ML IJ SOLN
10.0000 mg | Freq: Once | INTRAMUSCULAR | Status: AC
  Administered 2016-09-29: 10 mg via INTRAVENOUS

## 2016-09-29 MED ORDER — LORAZEPAM 0.5 MG PO TABS: 0.5000 mg | ORAL_TABLET | Freq: Three times a day (TID) | ORAL | 0 refills | Status: DC | PRN

## 2016-09-29 MED ORDER — DEXAMETHASONE SODIUM PHOSPHATE 10 MG/ML IJ SOLN
INTRAMUSCULAR | Status: AC
  Filled 2016-09-29: qty 1

## 2016-09-29 MED ORDER — SODIUM CHLORIDE 0.9 % IV SOLN
800.0000 mg/m2 | Freq: Once | INTRAVENOUS | Status: AC
  Administered 2016-09-29: 1938 mg via INTRAVENOUS
  Filled 2016-09-29: qty 50.97

## 2016-09-29 MED ORDER — SODIUM CHLORIDE 0.9 % IV SOLN
Freq: Once | INTRAVENOUS | Status: AC
  Administered 2016-09-29: 14:00:00 via INTRAVENOUS

## 2016-09-29 MED ORDER — PALONOSETRON HCL INJECTION 0.25 MG/5ML
INTRAVENOUS | Status: AC
  Filled 2016-09-29: qty 5

## 2016-09-29 NOTE — Progress Notes (Addendum)
[facility] OFFICE PROGRESS NOTE   Diagnosis:  Pancreas cancer  INTERVAL HISTORY:   Ms. Shippy returns as scheduled. She completed cycle 1 of gemcitabine/Abraxane 09/13/2016. Postchemotherapy course was complicated by significant nausea/vomiting and a rash. Treatment was held 09/20/2016.  She is feeling much better. No further nausea/vomiting. Overall good appetite. Continued pain right side/back. No rash.  Objective:  Vital signs in last 24 hours:  Blood pressure (!) 137/59, pulse 92, temperature (!) 97.3 F (36.3 C), temperature source Oral, resp. rate 18, height 5\' 5"  (1.651 m), weight 281 lb 11.2 oz (127.8 kg), SpO2 (!) 18 %.    HEENT: No thrush or ulcers. Resp: Lungs clear bilaterally. Cardio: Regular rate and rhythm. GI: Abdomen soft and nontender. No hepatomegaly. Vascular: No leg edema. Skin: No rash. Port-A-Cath without erythema.    Lab Results:  Lab Results  Component Value Date   WBC 13.6 (H) 09/29/2016   HGB 12.3 09/29/2016   HCT 37.4 09/29/2016   MCV 88.4 09/29/2016   PLT 315 09/29/2016   NEUTROABS 12.1 (H) 09/29/2016    Imaging:  No results found.  Medications: I have reviewed the patient's current medications.  Assessment/Plan: 1. Metastatic pancreas cancer ? Pancreas body mass and liver metastases noted on CT abdomen/pelvis 08/11/2016 ? Ultrasound-guided biopsy of a right liver lesion 08/16/2016 revealed poorly differentiated adenocarcinoma consistent with pancreas cancer ? Cycle 1 gemcitabine/Abraxane 09/13/2016; 09/29/2016  2. Right posterior lateral chest pain-potentially related to a lateral liver metastasis  3. Hypertension  4. Sleep apnea  5. Chronic low back pain  6. Recurrent urinary tract infections  7. Depression  8. Migraine headaches  9. Port-A-Cath placement 08/28/2016  10. Rash following cycle 1 gemcitabine/Abraxane-drug rash?  11.  Nausea/vomiting following cycle 1  gemcitabine/Abraxane-antiemetic regimen adjusted with addition of Aloxi  12. Hyperglycemia 09/29/2016.    Disposition: Ms. Abeyta appears stable. She completed cycle 1 day 1 gemcitabine/Abraxane 09/13/2016. Postchemotherapy course was complicated by significant nausea/vomiting and a rash. The day 8 treatment was held. Plan to proceed with treatment today as scheduled pending the chemistry panel results. Aloxi added to the premedication regimen. A prescription was called her pharmacy for Ativan 0.5 mg every 8 hours as needed. She has Compazine at home. She understands to contact the office if she has nausea despite these measures.  Due to the rash after the cycle 1 day 1 treatment she was premedicated with Decadron 10 mg last night and will receive additional Decadron as a premedication today. She understands to contact the office if she develops a rash following today's treatment.  She will return for a follow-up visit and cycle 2 day 1 gemcitabine/Abraxane 10/11/2016. Schedule going forward will be day one/day 8 of a 21 day cycle. She will contact the office prior to her next visit as outlined above or with any other problems.  Patient seen with Dr. Benay Spice.  Ned Card ANP/GNP-BC   09/29/2016  1:18 PM  Addendum: The chemistry panel returned with marked hyperglycemia, 495. She received 10 units of regular insulin. Follow-up CBG 437. She received an additional 10 units of regular insulin. Follow-up CBG 365. She was given 10 more units of regular insulin. She is able to monitor CBGs at home. She will follow closely over the weekend and understands to contact her PCP with readings in the 400 range. She will avoid concentrated sweets. We contacted Dr. Nathanial Millman office to alert them of the above. They requested an A1c which we ordered.   This was a shared  visit with Ned Card. Ms. Fischl presents for the second treatment with gemcitabine/Abraxane. The anti-emetic regimen will be adjusted with this  cycle. She received Decadron prophylaxis secondary to the rash following cycle 1. The blood sugar was markedly elevated today. She received regular insulin at the [facility] We contacted her primary physician for recommendations on blood sugar management. She will monitor her blood sugar at home and follow a diabetic diet.  The plan is to complete gemcitabine/Abraxane today and then begin treatment on a day one/day 8 schedule 10/12/2016. Julieanne Manson, M.D.

## 2016-09-29 NOTE — Telephone Encounter (Signed)
Recd call from sarah/oncology---patient is currently in their office getting chemo infusion and took dexamethasone for first time last night prior to tx---patient has POCT glucose reading of 495, asymptomatic and may possibly not be able to tolerate infusion based on last try not working---next infusion if tolerated today will be mid July----so could be possible that this is prednisone induced glucose elevation and will correct itself over the weekend----sarah is to advise patient to continue to monitor glucose levels at home for remainder of weekend, if glucose remains high and/or patient becomes symptomatic can go to ED, or if glucose remains high thru weekend, patient should call our office back on Monday for further advisement---sarah is having their lab add on [facility] lab to labs already drawn at oncology in case we need [facility] lab value on Monday

## 2016-09-29 NOTE — Telephone Encounter (Signed)
Patient has blood glucose of 495 in [facility] today prior to treatment. Called to notify PCP. Per covering provider pt is to monitor cbg over the weekend, if she becomes symptomatic she is to report to the ED. Patient is to call PCP on Monday if sugar remains high over the weekend. A1C ordered per advice from PCP.   Discussed the above directions with patient in treatment room. Patient reports she is in contact with a new PCP she is in the process of switching. She is trying to get an appt on Monday with him. Patient verbalized an understanding of directions to continue to monitor and report to ED. She states she will contact current PCP if she is unable to get a new PCP appt on Monday.

## 2016-09-29 NOTE — Patient Instructions (Signed)
[facility] Discharge Instructions for Patients Receiving Chemotherapy  Today you received the following chemotherapy agents Abraxane/Gemzar  To help prevent nausea and vomiting after your treatment, we encourage you to take your nausea medication    If you develop nausea and vomiting that is not controlled by your nausea medication, call the clinic.   BELOW ARE SYMPTOMS THAT SHOULD BE REPORTED IMMEDIATELY:  *FEVER GREATER THAN 100.5 F  *CHILLS WITH OR WITHOUT FEVER  NAUSEA AND VOMITING THAT IS NOT CONTROLLED WITH YOUR NAUSEA MEDICATION  *UNUSUAL SHORTNESS OF BREATH  *UNUSUAL BRUISING OR BLEEDING  TENDERNESS IN MOUTH AND THROAT WITH OR WITHOUT PRESENCE OF ULCERS  *URINARY PROBLEMS  *BOWEL PROBLEMS  UNUSUAL RASH Items with * indicate a potential emergency and should be followed up as soon as possible.  Feel free to call the clinic you have any questions or concerns. The clinic phone number is [phone].  Please show the CHEMO ALERT CARD at check-in to the Emergency Department and triage nurse.   

## 2016-09-29 NOTE — Patient Instructions (Signed)
Implanted Port Home Guide An implanted port is a type of central line that is placed under the skin. Central lines are used to provide IV access when treatment or nutrition needs to be given through a person's veins. Implanted ports are used for long-term IV access. An implanted port may be placed because:  You need IV medicine that would be irritating to the small veins in your hands or arms.  You need long-term IV medicines, such as antibiotics.  You need IV nutrition for a long period.  You need frequent blood draws for lab tests.  You need dialysis.  Implanted ports are usually placed in the chest area, but they can also be placed in the upper arm, the abdomen, or the leg. An implanted port has two main parts:  Reservoir. The reservoir is round and will appear as a small, raised area under your skin. The reservoir is the part where a needle is inserted to give medicines or draw blood.  Catheter. The catheter is a thin, flexible tube that extends from the reservoir. The catheter is placed into a large vein. Medicine that is inserted into the reservoir goes into the catheter and then into the vein.  How will I care for my incision site? Do not get the incision site wet. Bathe or shower as directed by your health care provider. How is my port accessed? Special steps must be taken to access the port:  Before the port is accessed, a numbing cream can be placed on the skin. This helps numb the skin over the port site.  Your health care provider uses a sterile technique to access the port. ? Your health care provider must put on a mask and sterile gloves. ? The skin over your port is cleaned carefully with an antiseptic and allowed to dry. ? The port is gently pinched between sterile gloves, and a needle is inserted into the port.  Only "non-coring" port needles should be used to access the port. Once the port is accessed, a blood return should be checked. This helps ensure that the port  is in the vein and is not clogged.  If your port needs to remain accessed for a constant infusion, a clear (transparent) bandage will be placed over the needle site. The bandage and needle will need to be changed every week, or as directed by your health care provider.  Keep the bandage covering the needle clean and dry. Do not get it wet. Follow your health care provider's instructions on how to take a shower or bath while the port is accessed.  If your port does not need to stay accessed, no bandage is needed over the port.  What is flushing? Flushing helps keep the port from getting clogged. Follow your health care provider's instructions on how and when to flush the port. Ports are usually flushed with saline solution or a medicine called heparin. The need for flushing will depend on how the port is used.  If the port is used for intermittent medicines or blood draws, the port will need to be flushed: ? After medicines have been given. ? After blood has been drawn. ? As part of routine maintenance.  If a constant infusion is running, the port may not need to be flushed.  How long will my port stay implanted? The port can stay in for as long as your health care provider thinks it is needed. When it is time for the port to come out, surgery will be   done to remove it. The procedure is similar to the one performed when the port was put in. When should I seek immediate medical care? When you have an implanted port, you should seek immediate medical care if:  You notice a bad smell coming from the incision site.  You have swelling, redness, or drainage at the incision site.  You have more swelling or pain at the port site or the surrounding area.  You have a fever that is not controlled with medicine.  This information is not intended to replace advice given to you by your health care provider. Make sure you discuss any questions you have with your health care provider. Document  Released: 03/13/2005 Document Revised: 08/19/2015 Document Reviewed: 11/18/2012 Elsevier Interactive Patient Education  2017 [employer].  

## 2016-09-30 LAB — HEMOGLOBIN A1C
ESTIMATED AVERAGE GLUCOSE: 160 mg/dL
Hemoglobin A1c: 7.2 % — ABNORMAL HIGH (ref 4.8–5.6)

## 2016-10-02 NOTE — Telephone Encounter (Signed)
Called patient today to make sure she had been checking glucose readings thru out weekend, patient has and has current reading of 250, still asymptomatic---patient reminded to keep checking and if sugar gets above 400 and/or she becomes symptomatic to seek help either at our office or ED

## 2016-10-04 ENCOUNTER — Telehealth: Payer: Self-pay | Admitting: Nurse Practitioner

## 2016-10-04 NOTE — Telephone Encounter (Signed)
Patient would like to have early morning appointment and please call to schedule the next set of appointments

## 2016-10-06 ENCOUNTER — Other Ambulatory Visit: Payer: Self-pay | Admitting: Oncology

## 2016-10-09 ENCOUNTER — Telehealth: Payer: Self-pay | Admitting: *Deleted

## 2016-10-09 NOTE — Telephone Encounter (Signed)
"  Ask Dr. Benay Spice if I am to take steroids before chemotherapy again.  Return number [phone]."  Next Abraxane/Gemzar scheduled 10-11-2016.

## 2016-10-10 MED ORDER — DEXAMETHASONE 2 MG PO TABS: ORAL_TABLET | ORAL | 0 refills | Status: DC

## 2016-10-10 NOTE — Telephone Encounter (Addendum)
Verbal order receive and read back from Ned Card NP for dexamethasone 4 mg tonight at bedtime for pre-med of tomorrow's chemotherapy treatment.  Shared the following assessment.  Waiting for return call from PCP.  Notified patient of provider orders.  Confirmed pharmacy to send Dexamethasone.  Instructed to eat a small amount (few crackers) when taking Dexamethasone.     Called patient for further glucose readings and assessment needed to fulfill request of need for steroids pre-chemotherapy.  1. I do not take any pills or insulin for diabetes.  I am not a diabetic and have never been pre-diabetic. 2. I checked my blood sugar that Saturday, Sunday and Monday.  Monday, July 10th.  It was less than 200 so I felt it was going in the right direction and stopped.  I'm trying to watch what I eat. 3. I changed PCP's.  Dr. Julianne Rice office [place] blood panel today.  I was told results take a week.  With the second Abraxane/Gemzar chemotherapy on 09-29-2016 I felt tired but did not break out with a rash or anything like with the first treatment 09-13-2016.  The two can't be compared."  Gave examples of carbohydrates (Milk, yogurt, fruits, grains, beans, corn, peas, potatoes) suggesting eat no more than three CHO servings per meal.  Message left for new PCP's nurse requesting today's glucose and or Hgb- A1C results.  Care Team updated.

## 2016-10-10 NOTE — Addendum Note (Signed)
Addended by: Cherylynn Ridges on: 10/10/2016 05:47 PM   Modules accepted: Orders

## 2016-10-11 ENCOUNTER — Encounter: Payer: 59 | Admitting: Nutrition

## 2016-10-11 ENCOUNTER — Other Ambulatory Visit (HOSPITAL_BASED_OUTPATIENT_CLINIC_OR_DEPARTMENT_OTHER): Payer: 59

## 2016-10-11 ENCOUNTER — Ambulatory Visit (HOSPITAL_BASED_OUTPATIENT_CLINIC_OR_DEPARTMENT_OTHER): Payer: 59

## 2016-10-11 ENCOUNTER — Ambulatory Visit: Payer: 59

## 2016-10-11 ENCOUNTER — Telehealth: Payer: Self-pay | Admitting: Oncology

## 2016-10-11 ENCOUNTER — Ambulatory Visit (HOSPITAL_BASED_OUTPATIENT_CLINIC_OR_DEPARTMENT_OTHER): Payer: 59 | Admitting: Nurse Practitioner

## 2016-10-11 VITALS — BP 118/57 | HR 62 | Temp 97.8°F | Resp 17 | Ht 65.0 in | Wt 280.3 lb

## 2016-10-11 DIAGNOSIS — Z95828 Presence of other vascular implants and grafts: Secondary | ICD-10-CM

## 2016-10-11 DIAGNOSIS — I10 Essential (primary) hypertension: Secondary | ICD-10-CM

## 2016-10-11 DIAGNOSIS — Z5111 Encounter for antineoplastic chemotherapy: Secondary | ICD-10-CM | POA: Diagnosis not present

## 2016-10-11 DIAGNOSIS — C259 Malignant neoplasm of pancreas, unspecified: Secondary | ICD-10-CM

## 2016-10-11 DIAGNOSIS — C787 Secondary malignant neoplasm of liver and intrahepatic bile duct: Secondary | ICD-10-CM

## 2016-10-11 DIAGNOSIS — C251 Malignant neoplasm of body of pancreas: Secondary | ICD-10-CM | POA: Diagnosis not present

## 2016-10-11 LAB — COMPREHENSIVE METABOLIC PANEL
ALT: 21 U/L (ref 0–55)
AST: 11 U/L (ref 5–34)
Albumin: 3.2 g/dL — ABNORMAL LOW (ref 3.5–5.0)
Alkaline Phosphatase: 117 U/L (ref 40–150)
Anion Gap: 11 mEq/L (ref 3–11)
BUN: 11.1 mg/dL (ref 7.0–26.0)
CHLORIDE: 104 meq/L (ref 98–109)
CO2: 24 meq/L (ref 22–29)
CREATININE: 0.8 mg/dL (ref 0.6–1.1)
Calcium: 9.5 mg/dL (ref 8.4–10.4)
EGFR: 76 mL/min/{1.73_m2} — ABNORMAL LOW (ref >90)
Glucose: 246 mg/dl — ABNORMAL HIGH (ref 70–140)
POTASSIUM: 4.3 meq/L (ref 3.5–5.1)
Sodium: 139 mEq/L (ref 136–145)
Total Bilirubin: 0.42 mg/dL (ref 0.20–1.20)
Total Protein: 6.7 g/dL (ref 6.4–8.3)

## 2016-10-11 LAB — CBC WITH DIFFERENTIAL/PLATELET
BASO%: 0.6 % (ref 0.0–2.0)
BASOS ABS: 0 10*3/uL (ref 0.0–0.1)
EOS ABS: 0 10*3/uL (ref 0.0–0.5)
EOS%: 0.2 % (ref 0.0–7.0)
HCT: 34.5 % — ABNORMAL LOW (ref 34.8–46.6)
HEMOGLOBIN: 11.5 g/dL — AB (ref 11.6–15.9)
LYMPH%: 17.3 % (ref 14.0–49.7)
MCH: 29.1 pg (ref 25.1–34.0)
MCHC: 33.1 g/dL (ref 31.5–36.0)
MCV: 87.9 fL (ref 79.5–101.0)
MONO#: 0.2 10*3/uL (ref 0.1–0.9)
MONO%: 4.4 % (ref 0.0–14.0)
NEUT%: 77.5 % — ABNORMAL HIGH (ref 38.4–76.8)
NEUTROS ABS: 3.6 10*3/uL (ref 1.5–6.5)
PLATELETS: 144 10*3/uL — AB (ref 145–400)
RBC: 3.93 10*6/uL (ref 3.70–5.45)
RDW: 13.9 % (ref 11.2–14.5)
WBC: 4.6 10*3/uL (ref 3.9–10.3)
lymph#: 0.8 10*3/uL — ABNORMAL LOW (ref 0.9–3.3)

## 2016-10-11 MED ORDER — SODIUM CHLORIDE 0.9 % IV SOLN
2000.0000 mg | Freq: Once | INTRAVENOUS | Status: AC
  Administered 2016-10-11: 2000 mg via INTRAVENOUS
  Filled 2016-10-11: qty 52.6

## 2016-10-11 MED ORDER — HEPARIN SOD (PORK) LOCK FLUSH 100 UNIT/ML IV SOLN
500.0000 [IU] | Freq: Once | INTRAVENOUS | Status: AC | PRN
  Administered 2016-10-11: 500 [IU]
  Filled 2016-10-11: qty 5

## 2016-10-11 MED ORDER — PALONOSETRON HCL INJECTION 0.25 MG/5ML
0.2500 mg | Freq: Once | INTRAVENOUS | Status: AC
  Administered 2016-10-11: 0.25 mg via INTRAVENOUS

## 2016-10-11 MED ORDER — SODIUM CHLORIDE 0.9% FLUSH
10.0000 mL | INTRAVENOUS | Status: DC | PRN
  Administered 2016-10-11: 10 mL
  Filled 2016-10-11: qty 10

## 2016-10-11 MED ORDER — PACLITAXEL PROTEIN-BOUND CHEMO INJECTION 100 MG
100.0000 mg/m2 | Freq: Once | INTRAVENOUS | Status: AC
  Administered 2016-10-11: 250 mg via INTRAVENOUS
  Filled 2016-10-11: qty 50

## 2016-10-11 MED ORDER — DEXAMETHASONE SODIUM PHOSPHATE 10 MG/ML IJ SOLN
INTRAMUSCULAR | Status: AC
  Filled 2016-10-11: qty 1

## 2016-10-11 MED ORDER — SODIUM CHLORIDE 0.9% FLUSH
10.0000 mL | Freq: Once | INTRAVENOUS | Status: AC
Start: 2016-10-11 — End: 2016-10-11
  Administered 2016-10-11: 10 mL via INTRAVENOUS
  Filled 2016-10-11: qty 10

## 2016-10-11 MED ORDER — SODIUM CHLORIDE 0.9 % IV SOLN
Freq: Once | INTRAVENOUS | Status: AC
  Administered 2016-10-11: 10:00:00 via INTRAVENOUS

## 2016-10-11 MED ORDER — DEXAMETHASONE SODIUM PHOSPHATE 10 MG/ML IJ SOLN
5.0000 mg | Freq: Once | INTRAMUSCULAR | Status: AC
  Administered 2016-10-11: 5 mg via INTRAVENOUS

## 2016-10-11 MED ORDER — PALONOSETRON HCL INJECTION 0.25 MG/5ML
INTRAVENOUS | Status: AC
  Filled 2016-10-11: qty 5

## 2016-10-11 NOTE — Patient Instructions (Signed)
[facility] Discharge Instructions for Patients Receiving Chemotherapy  Today you received the following chemotherapy agents Abraxane/Gemzar  To help prevent nausea and vomiting after your treatment, we encourage you to take your nausea medication    If you develop nausea and vomiting that is not controlled by your nausea medication, call the clinic.   BELOW ARE SYMPTOMS THAT SHOULD BE REPORTED IMMEDIATELY:  *FEVER GREATER THAN 100.5 F  *CHILLS WITH OR WITHOUT FEVER  NAUSEA AND VOMITING THAT IS NOT CONTROLLED WITH YOUR NAUSEA MEDICATION  *UNUSUAL SHORTNESS OF BREATH  *UNUSUAL BRUISING OR BLEEDING  TENDERNESS IN MOUTH AND THROAT WITH OR WITHOUT PRESENCE OF ULCERS  *URINARY PROBLEMS  *BOWEL PROBLEMS  UNUSUAL RASH Items with * indicate a potential emergency and should be followed up as soon as possible.  Feel free to call the clinic you have any questions or concerns. The clinic phone number is [phone].  Please show the CHEMO ALERT CARD at check-in to the Emergency Department and triage nurse.   

## 2016-10-11 NOTE — Progress Notes (Signed)
  [place] OFFICE PROGRESS NOTE   Diagnosis:  Pancreas cancer  INTERVAL HISTORY:   Heather Frederick returns as scheduled. She completed cycle 2 gemcitabine/Abraxane 09/29/2016. She denies nausea/vomiting. No rash. No mouth sores. She has had some constipation. She is on a laxative. No fever. No shortness of breath. She has some abdominal discomfort which she relates to the constipation. Blood sugars have been better, mainly in the 200 or less range.  Objective:  Vital signs in last 24 hours:  Blood pressure (!) 118/57, pulse 62, temperature 97.8 F (36.6 C), temperature source Oral, resp. rate 17, height 5\' 5"  (1.651 m), weight 280 lb 4.8 oz (127.1 kg), SpO2 97 %.    HEENT: No thrush or ulcers. Resp: Lungs clear bilaterally. Cardio: Regular rate and rhythm. GI: Abdomen soft and nontender. No hepatomegaly. Vascular: No leg edema.  Skin: No rash. Port-A-Cath without erythema.    Lab Results:  Lab Results  Component Value Date   WBC 4.6 10/11/2016   HGB 11.5 (L) 10/11/2016   HCT 34.5 (L) 10/11/2016   MCV 87.9 10/11/2016   PLT 144 (L) 10/11/2016   NEUTROABS 3.6 10/11/2016    Imaging:  No results found.  Medications: I have reviewed the patient's current medications.  Assessment/Plan: 1. Metastatic pancreas cancer ? Pancreas body mass and liver metastases noted on CT abdomen/pelvis 08/11/2016 ? Ultrasound-guided biopsy of a right liver lesion 08/16/2016 revealed poorly differentiated adenocarcinoma consistent with pancreas cancer ? Cycle 1 gemcitabine/Abraxane 09/13/2016; 09/29/2016 ? Cycle 2 gemcitabine/Abraxane 10/11/2016  2. Right posterior lateral chest pain-potentially related to a lateral liver metastasis  3. Hypertension  4. Sleep apnea  5. Chronic low back pain  6. Recurrent urinary tract infections  7. Depression  8. Migraine headaches  9. Port-A-Cath placement 08/28/2016  10. Rash following cycle 1  gemcitabine/Abraxane-drug rash?  11. Nausea/vomiting following cycle 1 gemcitabine/Abraxane-antiemetic regimen adjusted with addition of Aloxi  12. Hyperglycemia 09/29/2016. Likely steroid induced. Improved.   Disposition: [name] appears stable. She completed a treatment with gemcitabine/Abraxane on 09/29/2016. She had marked hyperglycemia likely due to steroid premedication. Blood sugars are better. She did not have a rash following treatment and had no significant nausea. We reduced the steroid premedication last night to 4 mg and will decrease today's steroid dose to 5 mg. If she does not have a rash or significant nausea with today's treatment we will eliminate the steroid the night before treatment next week.   Plan to proceed with cycle 2 day 1 gemcitabine/Abraxane today as scheduled.  She will monitor her blood sugars closely over the next several days. She understands she should be following a diabetic diet.  She will return for a follow-up visit and cycle 2 day 8 gemcitabine/Abraxane in one week. She will contact the office in the interim with any problems.    Ned Card ANP/GNP-BC   10/11/2016  9:06 AM

## 2016-10-11 NOTE — Telephone Encounter (Signed)
FYI "Daphine with Helayne Seminole PA with [facility].  Returning call about this patient.  She came in on 10-09-2016, was seen by Helayne Seminole PA-C to establish care.  She chose Dr. Maudie Mercury as her Primary and will see him as a new patient 10-23-2016.  Yesterday's Hgb A1-C = 7.4."  This nurse shared yesterday's discussion with patient in regards to ADA diet however patient denies diabetes.

## 2016-10-11 NOTE — Patient Instructions (Signed)
Implanted Port Home Guide An implanted port is a type of central line that is placed under the skin. Central lines are used to provide IV access when treatment or nutrition needs to be given through a person's veins. Implanted ports are used for long-term IV access. An implanted port may be placed because:  You need IV medicine that would be irritating to the small veins in your hands or arms.  You need long-term IV medicines, such as antibiotics.  You need IV nutrition for a long period.  You need frequent blood draws for lab tests.  You need dialysis.  Implanted ports are usually placed in the chest area, but they can also be placed in the upper arm, the abdomen, or the leg. An implanted port has two main parts:  Reservoir. The reservoir is round and will appear as a small, raised area under your skin. The reservoir is the part where a needle is inserted to give medicines or draw blood.  Catheter. The catheter is a thin, flexible tube that extends from the reservoir. The catheter is placed into a large vein. Medicine that is inserted into the reservoir goes into the catheter and then into the vein.  How will I care for my incision site? Do not get the incision site wet. Bathe or shower as directed by your health care provider. How is my port accessed? Special steps must be taken to access the port:  Before the port is accessed, a numbing cream can be placed on the skin. This helps numb the skin over the port site.  Your health care provider uses a sterile technique to access the port. ? Your health care provider must put on a mask and sterile gloves. ? The skin over your port is cleaned carefully with an antiseptic and allowed to dry. ? The port is gently pinched between sterile gloves, and a needle is inserted into the port.  Only "non-coring" port needles should be used to access the port. Once the port is accessed, a blood return should be checked. This helps ensure that the port  is in the vein and is not clogged.  If your port needs to remain accessed for a constant infusion, a clear (transparent) bandage will be placed over the needle site. The bandage and needle will need to be changed every week, or as directed by your health care provider.  Keep the bandage covering the needle clean and dry. Do not get it wet. Follow your health care provider's instructions on how to take a shower or bath while the port is accessed.  If your port does not need to stay accessed, no bandage is needed over the port.  What is flushing? Flushing helps keep the port from getting clogged. Follow your health care provider's instructions on how and when to flush the port. Ports are usually flushed with saline solution or a medicine called heparin. The need for flushing will depend on how the port is used.  If the port is used for intermittent medicines or blood draws, the port will need to be flushed: ? After medicines have been given. ? After blood has been drawn. ? As part of routine maintenance.  If a constant infusion is running, the port may not need to be flushed.  How long will my port stay implanted? The port can stay in for as long as your health care provider thinks it is needed. When it is time for the port to come out, surgery will be   done to remove it. The procedure is similar to the one performed when the port was put in. When should I seek immediate medical care? When you have an implanted port, you should seek immediate medical care if:  You notice a bad smell coming from the incision site.  You have swelling, redness, or drainage at the incision site.  You have more swelling or pain at the port site or the surrounding area.  You have a fever that is not controlled with medicine.  This information is not intended to replace advice given to you by your health care provider. Make sure you discuss any questions you have with your health care provider. Document  Released: 03/13/2005 Document Revised: 08/19/2015 Document Reviewed: 11/18/2012 Elsevier Interactive Patient Education  2017 [employer].  

## 2016-10-11 NOTE — Telephone Encounter (Signed)
Gave patient avs report and appointments for July and August.  °

## 2016-10-12 LAB — CANCER ANTIGEN 19-9: CA 19-9: 4011 U/mL — ABNORMAL HIGH (ref 0–35)

## 2016-10-14 ENCOUNTER — Other Ambulatory Visit: Payer: Self-pay | Admitting: Oncology

## 2016-10-16 ENCOUNTER — Telehealth: Payer: Self-pay | Admitting: *Deleted

## 2016-10-16 NOTE — Telephone Encounter (Signed)
Called [facility] re: received fax that relpax was rejected for reimbursement. Spoke with Lilia Pro who stated that the medication is now available in generic. Pharmacist will fill with generic. Lilia Pro stated to disregard the fax.

## 2016-10-18 ENCOUNTER — Ambulatory Visit: Payer: 59

## 2016-10-18 ENCOUNTER — Ambulatory Visit (HOSPITAL_BASED_OUTPATIENT_CLINIC_OR_DEPARTMENT_OTHER): Payer: 59 | Admitting: Nurse Practitioner

## 2016-10-18 ENCOUNTER — Other Ambulatory Visit (HOSPITAL_BASED_OUTPATIENT_CLINIC_OR_DEPARTMENT_OTHER): Payer: 59

## 2016-10-18 ENCOUNTER — Ambulatory Visit (HOSPITAL_BASED_OUTPATIENT_CLINIC_OR_DEPARTMENT_OTHER): Payer: 59

## 2016-10-18 VITALS — BP 117/60 | HR 53

## 2016-10-18 DIAGNOSIS — C251 Malignant neoplasm of body of pancreas: Secondary | ICD-10-CM

## 2016-10-18 DIAGNOSIS — C787 Secondary malignant neoplasm of liver and intrahepatic bile duct: Secondary | ICD-10-CM | POA: Diagnosis not present

## 2016-10-18 DIAGNOSIS — C259 Malignant neoplasm of pancreas, unspecified: Secondary | ICD-10-CM

## 2016-10-18 DIAGNOSIS — Z5111 Encounter for antineoplastic chemotherapy: Secondary | ICD-10-CM | POA: Diagnosis not present

## 2016-10-18 DIAGNOSIS — I10 Essential (primary) hypertension: Secondary | ICD-10-CM

## 2016-10-18 DIAGNOSIS — Z95828 Presence of other vascular implants and grafts: Secondary | ICD-10-CM | POA: Insufficient documentation

## 2016-10-18 LAB — CBC WITH DIFFERENTIAL/PLATELET
BASO%: 0.2 % (ref 0.0–2.0)
Basophils Absolute: 0 10*3/uL (ref 0.0–0.1)
EOS%: 0 % (ref 0.0–7.0)
Eosinophils Absolute: 0 10*3/uL (ref 0.0–0.5)
HEMATOCRIT: 34.1 % — AB (ref 34.8–46.6)
HGB: 11.2 g/dL — ABNORMAL LOW (ref 11.6–15.9)
LYMPH%: 22.4 % (ref 14.0–49.7)
MCH: 29.1 pg (ref 25.1–34.0)
MCHC: 32.8 g/dL (ref 31.5–36.0)
MCV: 88.6 fL (ref 79.5–101.0)
MONO#: 0.3 10*3/uL (ref 0.1–0.9)
MONO%: 4.6 % (ref 0.0–14.0)
NEUT#: 3.9 10*3/uL (ref 1.5–6.5)
NEUT%: 72.8 % (ref 38.4–76.8)
PLATELETS: 145 10*3/uL (ref 145–400)
RBC: 3.85 10*6/uL (ref 3.70–5.45)
RDW: 14.1 % (ref 11.2–14.5)
WBC: 5.4 10*3/uL (ref 3.9–10.3)
lymph#: 1.2 10*3/uL (ref 0.9–3.3)
nRBC: 1 % — ABNORMAL HIGH (ref 0–0)

## 2016-10-18 LAB — COMPREHENSIVE METABOLIC PANEL
ALK PHOS: 112 U/L (ref 40–150)
ALT: 38 U/L (ref 0–55)
ANION GAP: 9 meq/L (ref 3–11)
AST: 20 U/L (ref 5–34)
Albumin: 3.4 g/dL — ABNORMAL LOW (ref 3.5–5.0)
BILIRUBIN TOTAL: 0.32 mg/dL (ref 0.20–1.20)
BUN: 12.7 mg/dL (ref 7.0–26.0)
CALCIUM: 9.4 mg/dL (ref 8.4–10.4)
CHLORIDE: 106 meq/L (ref 98–109)
CO2: 25 mEq/L (ref 22–29)
CREATININE: 0.9 mg/dL (ref 0.6–1.1)
EGFR: 66 mL/min/{1.73_m2} — ABNORMAL LOW (ref >90)
GLUCOSE: 290 mg/dL — AB (ref 70–140)
POTASSIUM: 4.2 meq/L (ref 3.5–5.1)
Sodium: 140 mEq/L (ref 136–145)
Total Protein: 6.7 g/dL (ref 6.4–8.3)

## 2016-10-18 LAB — TECHNOLOGIST REVIEW: Technologist Review: 1

## 2016-10-18 MED ORDER — SODIUM CHLORIDE 0.9 % IV SOLN
Freq: Once | INTRAVENOUS | Status: AC
  Administered 2016-10-18: 13:00:00 via INTRAVENOUS

## 2016-10-18 MED ORDER — DEXAMETHASONE SODIUM PHOSPHATE 10 MG/ML IJ SOLN
INTRAMUSCULAR | Status: AC
Start: 2016-10-18 — End: 2016-10-18
  Filled 2016-10-18: qty 1

## 2016-10-18 MED ORDER — SODIUM CHLORIDE 0.9% FLUSH
10.0000 mL | INTRAVENOUS | Status: DC | PRN
  Administered 2016-10-18: 10 mL
  Filled 2016-10-18: qty 10

## 2016-10-18 MED ORDER — DEXAMETHASONE SODIUM PHOSPHATE 10 MG/ML IJ SOLN
5.0000 mg | Freq: Once | INTRAMUSCULAR | Status: AC
  Administered 2016-10-18: 5 mg via INTRAVENOUS

## 2016-10-18 MED ORDER — SODIUM CHLORIDE 0.9 % IV SOLN
2000.0000 mg | Freq: Once | INTRAVENOUS | Status: AC
  Administered 2016-10-18: 2000 mg via INTRAVENOUS
  Filled 2016-10-18: qty 52.6

## 2016-10-18 MED ORDER — HEPARIN SOD (PORK) LOCK FLUSH 100 UNIT/ML IV SOLN
500.0000 [IU] | Freq: Once | INTRAVENOUS | Status: AC | PRN
Start: 2016-10-18 — End: 2016-10-18
  Administered 2016-10-18: 500 [IU]
  Filled 2016-10-18: qty 5

## 2016-10-18 MED ORDER — SODIUM CHLORIDE 0.9% FLUSH
10.0000 mL | INTRAVENOUS | Status: DC | PRN
  Administered 2016-10-18: 10 mL via INTRAVENOUS
  Filled 2016-10-18: qty 10

## 2016-10-18 MED ORDER — PALONOSETRON HCL INJECTION 0.25 MG/5ML
0.2500 mg | Freq: Once | INTRAVENOUS | Status: AC
  Administered 2016-10-18: 0.25 mg via INTRAVENOUS

## 2016-10-18 MED ORDER — LORAZEPAM 0.5 MG PO TABS: 0.5000 mg | ORAL_TABLET | Freq: Three times a day (TID) | ORAL | 0 refills | Status: DC | PRN

## 2016-10-18 MED ORDER — PALONOSETRON HCL INJECTION 0.25 MG/5ML
INTRAVENOUS | Status: AC
  Filled 2016-10-18: qty 5

## 2016-10-18 MED ORDER — PACLITAXEL PROTEIN-BOUND CHEMO INJECTION 100 MG
100.0000 mg/m2 | Freq: Once | INTRAVENOUS | Status: AC
  Administered 2016-10-18: 250 mg via INTRAVENOUS
  Filled 2016-10-18: qty 50

## 2016-10-18 NOTE — Patient Instructions (Signed)
[facility] Discharge Instructions for Patients Receiving Chemotherapy  Today you received the following chemotherapy agents: Abraxane and Gemzar   To help prevent nausea and vomiting after your treatment, we encourage you to take your nausea medication as directed.    If you develop nausea and vomiting that is not controlled by your nausea medication, call the clinic.   BELOW ARE SYMPTOMS THAT SHOULD BE REPORTED IMMEDIATELY:  *FEVER GREATER THAN 100.5 F  *CHILLS WITH OR WITHOUT FEVER  NAUSEA AND VOMITING THAT IS NOT CONTROLLED WITH YOUR NAUSEA MEDICATION  *UNUSUAL SHORTNESS OF BREATH  *UNUSUAL BRUISING OR BLEEDING  TENDERNESS IN MOUTH AND THROAT WITH OR WITHOUT PRESENCE OF ULCERS  *URINARY PROBLEMS  *BOWEL PROBLEMS  UNUSUAL RASH Items with * indicate a potential emergency and should be followed up as soon as possible.  Feel free to call the clinic you have any questions or concerns. The clinic phone number is [phone].  Please show the CHEMO ALERT CARD at check-in to the Emergency Department and triage nurse.   

## 2016-10-18 NOTE — Progress Notes (Signed)
  [place] OFFICE PROGRESS NOTE   Diagnosis:  Pancreas cancer  INTERVAL HISTORY:   Ms. Mandrell returns as scheduled. She completed cycle 2 day 1 Gemcitabine/Abraxane 10/11/2016. She denies nausea/vomiting. No mouth sores. No diarrhea. Laxatives have been effective for constipation. No fever or cough. No rash. She reports improved control of blood sugars. Highest reading this week 222. She was recently started on insulin per her PCP. The right-sided abdominal/back pain she was experiencing prior to beginning chemotherapy has resolved. Objective:  Vital signs in last 24 hours:  Blood pressure (!) 119/21, pulse 76, temperature 98 F (36.7 C), temperature source Oral, resp. rate 18, height 5\' 5"  (1.651 m), weight 276 lb 9.6 oz (125.5 kg), SpO2 97 %.    HEENT: No thrush or ulcers. Resp: Lungs clear bilaterally. Cardio: Regular rate and rhythm. GI: Abdomen is soft and nontender. No hepatomegaly. Vascular: No leg edema. Skin: No rash. Port-A-Cath without erythema.    Lab Results:  Lab Results  Component Value Date   WBC 5.4 10/18/2016   HGB 11.2 (L) 10/18/2016   HCT 34.1 (L) 10/18/2016   MCV 88.6 10/18/2016   PLT 145 10/18/2016   NEUTROABS 3.9 10/18/2016    Imaging:  No results found.  Medications: I have reviewed the patient's current medications.  Assessment/Plan: 1. Metastatic pancreas cancer ? Pancreas body mass and liver metastases noted on CT abdomen/pelvis 08/11/2016 ? Ultrasound-guided biopsy of a right liver lesion 08/16/2016 revealed poorly differentiated adenocarcinoma consistent with pancreas cancer ? Cycle 1 gemcitabine/Abraxane 09/13/2016; 09/29/2016 ? Cycle 2 gemcitabine/Abraxane 10/11/2016, 10/18/2016  2. Right posterior lateral chest pain-potentially related to a lateral liver metastasis  3. Hypertension  4. Sleep apnea  5. Chronic low back pain  6. Recurrent urinary tract infections  7. Depression  8.  Migraine headaches  9. Port-A-Cath placement 08/28/2016  10. Rash following cycle 1 gemcitabine/Abraxane-drug rash?  11. Nausea/vomiting following cycle 1 gemcitabine/Abraxane-antiemetic regimen adjusted with addition of Aloxi  12. Hyperglycemia 09/29/2016. Likely steroid induced. Improved. Now on insulin.   Disposition: Ms. Foskett appears stable. She completed cycle 2 day 1 gemcitabine/Abraxane 10/11/2016. Plan to proceed with cycle 2 day 8 today as scheduled.   She had no nausea or rash following the most recent treatment. We will eliminate the steroid premedication the night before treatment with subsequent cycles.  She is now on insulin. She will continue close monitoring of her blood sugars and follow-up with Dr. Maudie Mercury.  She will return for a follow-up visit and cycle 3 day 1 gemcitabine/Abraxane on 11/01/2016. She will contact the office in the interim with any problems.  Plan reviewed with Dr. Benay Spice.    Ned Card ANP/GNP-BC   10/18/2016  12:35 PM

## 2016-10-23 ENCOUNTER — Encounter: Payer: Self-pay | Admitting: *Deleted

## 2016-10-26 ENCOUNTER — Other Ambulatory Visit: Payer: Self-pay | Admitting: *Deleted

## 2016-10-26 NOTE — Telephone Encounter (Signed)
Received faxed request from [facility] for Lorazepam. Spoke with [facility], they have not received a prescription since 09/29/16. Noted order was entered on 7/25. Lorazepam script called in.

## 2016-11-01 ENCOUNTER — Telehealth: Payer: Self-pay | Admitting: Oncology

## 2016-11-01 ENCOUNTER — Ambulatory Visit: Payer: 59

## 2016-11-01 ENCOUNTER — Other Ambulatory Visit (HOSPITAL_BASED_OUTPATIENT_CLINIC_OR_DEPARTMENT_OTHER): Payer: 59

## 2016-11-01 ENCOUNTER — Ambulatory Visit (HOSPITAL_BASED_OUTPATIENT_CLINIC_OR_DEPARTMENT_OTHER): Payer: 59 | Admitting: Oncology

## 2016-11-01 ENCOUNTER — Ambulatory Visit (HOSPITAL_BASED_OUTPATIENT_CLINIC_OR_DEPARTMENT_OTHER): Payer: 59

## 2016-11-01 VITALS — BP 119/81 | HR 65 | Temp 98.3°F | Resp 18 | Ht 65.0 in | Wt 281.3 lb

## 2016-11-01 DIAGNOSIS — Z5111 Encounter for antineoplastic chemotherapy: Secondary | ICD-10-CM | POA: Diagnosis not present

## 2016-11-01 DIAGNOSIS — C259 Malignant neoplasm of pancreas, unspecified: Secondary | ICD-10-CM

## 2016-11-01 DIAGNOSIS — E119 Type 2 diabetes mellitus without complications: Secondary | ICD-10-CM

## 2016-11-01 DIAGNOSIS — I10 Essential (primary) hypertension: Secondary | ICD-10-CM

## 2016-11-01 DIAGNOSIS — C787 Secondary malignant neoplasm of liver and intrahepatic bile duct: Principal | ICD-10-CM

## 2016-11-01 DIAGNOSIS — C251 Malignant neoplasm of body of pancreas: Secondary | ICD-10-CM

## 2016-11-01 DIAGNOSIS — Z95828 Presence of other vascular implants and grafts: Secondary | ICD-10-CM

## 2016-11-01 LAB — CBC WITH DIFFERENTIAL/PLATELET
BASO%: 0.8 % (ref 0.0–2.0)
BASOS ABS: 0 10*3/uL (ref 0.0–0.1)
EOS ABS: 0.4 10*3/uL (ref 0.0–0.5)
EOS%: 8.1 % — AB (ref 0.0–7.0)
HEMATOCRIT: 34.2 % — AB (ref 34.8–46.6)
HEMOGLOBIN: 10.7 g/dL — AB (ref 11.6–15.9)
LYMPH#: 1.4 10*3/uL (ref 0.9–3.3)
LYMPH%: 27.4 % (ref 14.0–49.7)
MCH: 28.8 pg (ref 25.1–34.0)
MCHC: 31.3 g/dL — AB (ref 31.5–36.0)
MCV: 91.9 fL (ref 79.5–101.0)
MONO#: 0.8 10*3/uL (ref 0.1–0.9)
MONO%: 16.3 % — ABNORMAL HIGH (ref 0.0–14.0)
NEUT#: 2.4 10*3/uL (ref 1.5–6.5)
NEUT%: 47.4 % (ref 38.4–76.8)
PLATELETS: 182 10*3/uL (ref 145–400)
RBC: 3.72 10*6/uL (ref 3.70–5.45)
RDW: 16.5 % — ABNORMAL HIGH (ref 11.2–14.5)
WBC: 5 10*3/uL (ref 3.9–10.3)

## 2016-11-01 LAB — COMPREHENSIVE METABOLIC PANEL
ALBUMIN: 3.3 g/dL — AB (ref 3.5–5.0)
ALK PHOS: 101 U/L (ref 40–150)
ALT: 42 U/L (ref 0–55)
AST: 20 U/L (ref 5–34)
Anion Gap: 10 mEq/L (ref 3–11)
BUN: 8.3 mg/dL (ref 7.0–26.0)
CHLORIDE: 106 meq/L (ref 98–109)
CO2: 25 meq/L (ref 22–29)
Calcium: 9.4 mg/dL (ref 8.4–10.4)
Creatinine: 0.8 mg/dL (ref 0.6–1.1)
EGFR: 76 mL/min/{1.73_m2} — AB (ref >90)
GLUCOSE: 206 mg/dL — AB (ref 70–140)
POTASSIUM: 4.3 meq/L (ref 3.5–5.1)
SODIUM: 141 meq/L (ref 136–145)
Total Bilirubin: 0.4 mg/dL (ref 0.20–1.20)
Total Protein: 6.1 g/dL — ABNORMAL LOW (ref 6.4–8.3)

## 2016-11-01 MED ORDER — DEXAMETHASONE SODIUM PHOSPHATE 10 MG/ML IJ SOLN
5.0000 mg | Freq: Once | INTRAMUSCULAR | Status: AC
Start: 2016-11-01 — End: 2016-11-01
  Administered 2016-11-01: 5 mg via INTRAVENOUS

## 2016-11-01 MED ORDER — SODIUM CHLORIDE 0.9 % IV SOLN
Freq: Once | INTRAVENOUS | Status: AC
  Administered 2016-11-01: 10:00:00 via INTRAVENOUS

## 2016-11-01 MED ORDER — PALONOSETRON HCL INJECTION 0.25 MG/5ML
0.2500 mg | Freq: Once | INTRAVENOUS | Status: AC
  Administered 2016-11-01: 0.25 mg via INTRAVENOUS

## 2016-11-01 MED ORDER — PACLITAXEL PROTEIN-BOUND CHEMO INJECTION 100 MG
100.0000 mg/m2 | Freq: Once | INTRAVENOUS | Status: AC
  Administered 2016-11-01: 250 mg via INTRAVENOUS
  Filled 2016-11-01: qty 50

## 2016-11-01 MED ORDER — PALONOSETRON HCL INJECTION 0.25 MG/5ML
INTRAVENOUS | Status: AC
  Filled 2016-11-01: qty 5

## 2016-11-01 MED ORDER — HEPARIN SOD (PORK) LOCK FLUSH 100 UNIT/ML IV SOLN
500.0000 [IU] | Freq: Once | INTRAVENOUS | Status: AC | PRN
  Administered 2016-11-01: 500 [IU]
  Filled 2016-11-01: qty 5

## 2016-11-01 MED ORDER — DEXAMETHASONE SODIUM PHOSPHATE 10 MG/ML IJ SOLN
INTRAMUSCULAR | Status: AC
  Filled 2016-11-01: qty 1

## 2016-11-01 MED ORDER — SODIUM CHLORIDE 0.9% FLUSH
10.0000 mL | INTRAVENOUS | Status: DC | PRN
  Administered 2016-11-01: 10 mL
  Filled 2016-11-01: qty 10

## 2016-11-01 MED ORDER — SODIUM CHLORIDE 0.9% FLUSH
10.0000 mL | INTRAVENOUS | Status: DC | PRN
  Administered 2016-11-01: 10 mL via INTRAVENOUS
  Filled 2016-11-01: qty 10

## 2016-11-01 MED ORDER — SODIUM CHLORIDE 0.9 % IV SOLN
2000.0000 mg | Freq: Once | INTRAVENOUS | Status: AC
  Administered 2016-11-01: 2000 mg via INTRAVENOUS
  Filled 2016-11-01: qty 52.6

## 2016-11-01 NOTE — Progress Notes (Signed)
  [place] OFFICE PROGRESS NOTE   Diagnosis: Pancreas cancer  INTERVAL HISTORY:   Heather Frederick returns as scheduled. She completed another treatment with gemcitabine/Abraxane 10/18/2016. No rash. No nausea/vomiting. The right abdominal pain has resolved. She feels well. She is seen Dr. Maudie Mercury for management of diabetes. She is now using an insulin sliding scale. She has developed mild numbness at the first 3 toes of both feet. This does not interfere with activity.  Objective:  Vital signs in last 24 hours:  Blood pressure 119/81, pulse 65, temperature 98.3 F (36.8 C), temperature source Oral, resp. rate 18, height 5\' 5"  (1.651 m), weight 281 lb 4.8 oz (127.6 kg), SpO2 98 %.    HEENT: No thrush or ulcers Resp: Lungs clear bilaterally Cardio: Regular rate and rhythm GI: No hepatomegaly, nontender, no mass Vascular: No leg edema Neuro: Sensation is intact to light touch at the feet     Portacath/PICC-without erythema  Lab Results:  Lab Results  Component Value Date   WBC 5.0 11/01/2016   HGB 10.7 (L) 11/01/2016   HCT 34.2 (L) 11/01/2016   MCV 91.9 11/01/2016   PLT 182 11/01/2016   NEUTROABS 2.4 11/01/2016    CMP     Component Value Date/Time   NA 141 11/01/2016 0908   K 4.3 11/01/2016 0908   CL 104 08/07/2016 1037   CO2 25 11/01/2016 0908   GLUCOSE 206 (H) 11/01/2016 0908   BUN 8.3 11/01/2016 0908   CREATININE 0.8 11/01/2016 0908   CALCIUM 9.4 11/01/2016 0908   PROT 6.1 (L) 11/01/2016 0908   ALBUMIN 3.3 (L) 11/01/2016 0908   AST 20 11/01/2016 0908   ALT 42 11/01/2016 0908   ALKPHOS 101 11/01/2016 0908   BILITOT 0.40 11/01/2016 0908   GFRNONAA >60 06/27/2016 1030   GFRAA >60 06/27/2016 1030     Medications: I have reviewed the patient's current medications.  Assessment/Plan: 1. Metastatic pancreas cancer ? Pancreas body mass and liver metastases noted on CT abdomen/pelvis 08/11/2016 ? Ultrasound-guided biopsy of a right liver lesion  08/16/2016 revealed poorly differentiated adenocarcinoma consistent with pancreas cancer ? Cycle 1 gemcitabine/Abraxane 09/13/2016; 09/29/2016 ? Cycle 2 gemcitabine/Abraxane 10/11/2016, 10/18/2016 ? Cycle 3 gemcitabine/Abraxane 11/01/2016  2. Right posterior lateral chest pain-potentially related to a lateral liver metastasis-resolved  3. Hypertension  4. Sleep apnea  5. Chronic low back pain  6. Recurrent urinary tract infections  7. Depression  8. Migraine headaches  9. Port-A-Cath placement 08/28/2016  10. Rash following cycle 1 gemcitabine/Abraxane-drug rash?  11. Nausea/vomiting following cycle 1 gemcitabine/Abraxane-antiemetic regimen adjusted with addition of Aloxi  12. Diabetes    Disposition:  Heather Frederick has completed 2 cycles of gemcitabine/Abraxane. She has tolerated the chemotherapy well since day 1 cycle 1. No recurrent rash or nausea/vomiting. The abdominal pain has resolved. We will follow-up on the CA 19-9 from today. She may be developing early neuropathy symptoms from Abraxane. We will monitor this for now.  She will complete day 1 cycle 3 chemotherapy today. She will return for day 8 chemotherapy in 1 week.  Heather Frederick will return for an office visit prior to cycle 4 on 11/22/2016.  She will be referred for a restaging CT after cycle 4.  25 minutes were spent with the patient today. The majority of the time was used for counseling and coordination of care.  Donneta Romberg, MD  11/01/2016  10:18 AM

## 2016-11-01 NOTE — Telephone Encounter (Signed)
Added labs and flush to some appointments to August and September. Gave her husband an updated avs and calendar.

## 2016-11-01 NOTE — Patient Instructions (Signed)
[facility] Discharge Instructions for Patients Receiving Chemotherapy  Today you received the following chemotherapy agents Abraxane and gemzar.  To help prevent nausea and vomiting after your treatment, we encourage you to take your nausea medication as directed.   If you develop nausea and vomiting that is not controlled by your nausea medication, call the clinic.   BELOW ARE SYMPTOMS THAT SHOULD BE REPORTED IMMEDIATELY:  *FEVER GREATER THAN 100.5 F  *CHILLS WITH OR WITHOUT FEVER  NAUSEA AND VOMITING THAT IS NOT CONTROLLED WITH YOUR NAUSEA MEDICATION  *UNUSUAL SHORTNESS OF BREATH  *UNUSUAL BRUISING OR BLEEDING  TENDERNESS IN MOUTH AND THROAT WITH OR WITHOUT PRESENCE OF ULCERS  *URINARY PROBLEMS  *BOWEL PROBLEMS  UNUSUAL RASH Items with * indicate a potential emergency and should be followed up as soon as possible.  Feel free to call the clinic you have any questions or concerns. The clinic phone number is [phone].  Please show the CHEMO ALERT CARD at check-in to the Emergency Department and triage nurse.   

## 2016-11-01 NOTE — Telephone Encounter (Signed)
Gave patient avs and calendars for August and September.

## 2016-11-02 LAB — CANCER ANTIGEN 19-9: CA 19-9: 1494 U/mL — ABNORMAL HIGH (ref 0–35)

## 2016-11-08 ENCOUNTER — Ambulatory Visit (HOSPITAL_BASED_OUTPATIENT_CLINIC_OR_DEPARTMENT_OTHER): Payer: 59

## 2016-11-08 ENCOUNTER — Other Ambulatory Visit (HOSPITAL_BASED_OUTPATIENT_CLINIC_OR_DEPARTMENT_OTHER): Payer: 59

## 2016-11-08 VITALS — BP 107/66 | HR 64 | Temp 98.5°F | Resp 18

## 2016-11-08 DIAGNOSIS — C259 Malignant neoplasm of pancreas, unspecified: Secondary | ICD-10-CM

## 2016-11-08 DIAGNOSIS — Z452 Encounter for adjustment and management of vascular access device: Secondary | ICD-10-CM

## 2016-11-08 DIAGNOSIS — C787 Secondary malignant neoplasm of liver and intrahepatic bile duct: Secondary | ICD-10-CM | POA: Diagnosis not present

## 2016-11-08 DIAGNOSIS — C251 Malignant neoplasm of body of pancreas: Secondary | ICD-10-CM | POA: Diagnosis not present

## 2016-11-08 DIAGNOSIS — Z5111 Encounter for antineoplastic chemotherapy: Secondary | ICD-10-CM

## 2016-11-08 DIAGNOSIS — Z95828 Presence of other vascular implants and grafts: Secondary | ICD-10-CM

## 2016-11-08 LAB — COMPREHENSIVE METABOLIC PANEL
ALT: 77 U/L — AB (ref 0–55)
AST: 31 U/L (ref 5–34)
Albumin: 3.4 g/dL — ABNORMAL LOW (ref 3.5–5.0)
Alkaline Phosphatase: 105 U/L (ref 40–150)
Anion Gap: 8 mEq/L (ref 3–11)
BUN: 8.7 mg/dL (ref 7.0–26.0)
CHLORIDE: 106 meq/L (ref 98–109)
CO2: 28 meq/L (ref 22–29)
CREATININE: 0.9 mg/dL (ref 0.6–1.1)
Calcium: 9.6 mg/dL (ref 8.4–10.4)
EGFR: 75 mL/min/{1.73_m2} — ABNORMAL LOW (ref >90)
Glucose: 184 mg/dl — ABNORMAL HIGH (ref 70–140)
Potassium: 4.8 mEq/L (ref 3.5–5.1)
SODIUM: 142 meq/L (ref 136–145)
Total Bilirubin: 0.35 mg/dL (ref 0.20–1.20)
Total Protein: 6.4 g/dL (ref 6.4–8.3)

## 2016-11-08 LAB — CBC WITH DIFFERENTIAL/PLATELET
BASO%: 1.4 % (ref 0.0–2.0)
Basophils Absolute: 0.1 10*3/uL (ref 0.0–0.1)
EOS%: 2.6 % (ref 0.0–7.0)
Eosinophils Absolute: 0.1 10*3/uL (ref 0.0–0.5)
HCT: 34.3 % — ABNORMAL LOW (ref 34.8–46.6)
HGB: 11.2 g/dL — ABNORMAL LOW (ref 11.6–15.9)
LYMPH#: 1.6 10*3/uL (ref 0.9–3.3)
LYMPH%: 28.7 % (ref 14.0–49.7)
MCH: 29.4 pg (ref 25.1–34.0)
MCHC: 32.8 g/dL (ref 31.5–36.0)
MCV: 89.5 fL (ref 79.5–101.0)
MONO#: 0.7 10*3/uL (ref 0.1–0.9)
MONO%: 11.7 % (ref 0.0–14.0)
NEUT#: 3.1 10*3/uL (ref 1.5–6.5)
NEUT%: 55.6 % (ref 38.4–76.8)
Platelets: 145 10*3/uL (ref 145–400)
RBC: 3.83 10*6/uL (ref 3.70–5.45)
RDW: 16.5 % — ABNORMAL HIGH (ref 11.2–14.5)
WBC: 5.6 10*3/uL (ref 3.9–10.3)

## 2016-11-08 LAB — TECHNOLOGIST REVIEW: Technologist Review: 1

## 2016-11-08 MED ORDER — ALTEPLASE 2 MG IJ SOLR
2.0000 mg | Freq: Once | INTRAMUSCULAR | Status: AC | PRN
  Administered 2016-11-08: 2 mg
  Filled 2016-11-08: qty 2

## 2016-11-08 MED ORDER — SODIUM CHLORIDE 0.9% FLUSH
10.0000 mL | INTRAVENOUS | Status: DC | PRN
  Filled 2016-11-08: qty 10

## 2016-11-08 MED ORDER — PALONOSETRON HCL INJECTION 0.25 MG/5ML
0.2500 mg | Freq: Once | INTRAVENOUS | Status: AC
  Administered 2016-11-08: 0.25 mg via INTRAVENOUS

## 2016-11-08 MED ORDER — SODIUM CHLORIDE 0.9% FLUSH
10.0000 mL | INTRAVENOUS | Status: DC | PRN
  Administered 2016-11-08: 10 mL
  Filled 2016-11-08: qty 10

## 2016-11-08 MED ORDER — SODIUM CHLORIDE 0.9 % IV SOLN
Freq: Once | INTRAVENOUS | Status: AC
  Administered 2016-11-08: 10:00:00 via INTRAVENOUS

## 2016-11-08 MED ORDER — PALONOSETRON HCL INJECTION 0.25 MG/5ML
INTRAVENOUS | Status: AC
  Filled 2016-11-08: qty 5

## 2016-11-08 MED ORDER — DEXAMETHASONE SODIUM PHOSPHATE 10 MG/ML IJ SOLN
5.0000 mg | Freq: Once | INTRAMUSCULAR | Status: AC
  Administered 2016-11-08: 5 mg via INTRAVENOUS

## 2016-11-08 MED ORDER — PACLITAXEL PROTEIN-BOUND CHEMO INJECTION 100 MG
100.0000 mg/m2 | Freq: Once | INTRAVENOUS | Status: AC
  Administered 2016-11-08: 250 mg via INTRAVENOUS
  Filled 2016-11-08: qty 50

## 2016-11-08 MED ORDER — DEXAMETHASONE SODIUM PHOSPHATE 10 MG/ML IJ SOLN
INTRAMUSCULAR | Status: AC
Start: 2016-11-08 — End: 2016-11-08
  Filled 2016-11-08: qty 1

## 2016-11-08 MED ORDER — SODIUM CHLORIDE 0.9 % IV SOLN
2000.0000 mg | Freq: Once | INTRAVENOUS | Status: AC
  Administered 2016-11-08: 2000 mg via INTRAVENOUS
  Filled 2016-11-08: qty 52.6

## 2016-11-08 MED ORDER — HEPARIN SOD (PORK) LOCK FLUSH 100 UNIT/ML IV SOLN
500.0000 [IU] | Freq: Once | INTRAVENOUS | Status: AC | PRN
  Administered 2016-11-08: 500 [IU]
  Filled 2016-11-08: qty 5

## 2016-11-08 MED ORDER — GEMCITABINE HCL CHEMO INJECTION 1 GM/26.3ML: 800.0000 mg/m2 | Freq: Once | INTRAVENOUS | Status: DC

## 2016-11-08 NOTE — Progress Notes (Signed)
Pt flushed several times with needle repositioned and no

## 2016-11-08 NOTE — Patient Instructions (Addendum)
[facility] Discharge Instructions for Patients Receiving Chemotherapy  Today you received the following chemotherapy agents Abraxane/Gemzar  To help prevent nausea and vomiting after your treatment, we encourage you to take your nausea medication    If you develop nausea and vomiting that is not controlled by your nausea medication, call the clinic.   BELOW ARE SYMPTOMS THAT SHOULD BE REPORTED IMMEDIATELY:  *FEVER GREATER THAN 100.5 F  *CHILLS WITH OR WITHOUT FEVER  NAUSEA AND VOMITING THAT IS NOT CONTROLLED WITH YOUR NAUSEA MEDICATION  *UNUSUAL SHORTNESS OF BREATH  *UNUSUAL BRUISING OR BLEEDING  TENDERNESS IN MOUTH AND THROAT WITH OR WITHOUT PRESENCE OF ULCERS  *URINARY PROBLEMS  *BOWEL PROBLEMS  UNUSUAL RASH Items with * indicate a potential emergency and should be followed up as soon as possible.  Feel free to call the clinic you have any questions or concerns. The clinic phone number is [phone].  Please show the CHEMO ALERT CARD at check-in to the Emergency Department and triage nurse.   

## 2016-11-17 ENCOUNTER — Telehealth: Payer: Self-pay | Admitting: *Deleted

## 2016-11-17 ENCOUNTER — Other Ambulatory Visit: Payer: Self-pay | Admitting: Internal Medicine

## 2016-11-17 ENCOUNTER — Ambulatory Visit
Admission: RE | Admit: 2016-11-17 | Discharge: 2016-11-17 | Disposition: A | Discharge status: Home / Self Care | Payer: 59 | Source: Ambulatory Visit | Attending: Internal Medicine | Admitting: Internal Medicine

## 2016-11-17 DIAGNOSIS — M79601 Pain in right arm: Secondary | ICD-10-CM

## 2016-11-17 NOTE — Telephone Encounter (Signed)
Received fax from [facility] after hours call service. Pt called with R arm pain and edema. She was instructed to see PCP by after hours triage. Left message on voicemail requesting she call office with update.

## 2016-11-17 NOTE — Telephone Encounter (Signed)
Pt called stating she had [place] of her arm done. Ordered by Dr Maudie Mercury. Still awaiting results. [facility] did send her home. No report in epic yet.

## 2016-11-19 ENCOUNTER — Other Ambulatory Visit: Payer: Self-pay | Admitting: Oncology

## 2016-11-23 ENCOUNTER — Ambulatory Visit: Payer: 59

## 2016-11-23 ENCOUNTER — Other Ambulatory Visit: Payer: Self-pay | Admitting: Oncology

## 2016-11-23 ENCOUNTER — Ambulatory Visit (HOSPITAL_BASED_OUTPATIENT_CLINIC_OR_DEPARTMENT_OTHER): Payer: 59 | Admitting: Oncology

## 2016-11-23 ENCOUNTER — Telehealth: Payer: Self-pay | Admitting: Oncology

## 2016-11-23 ENCOUNTER — Other Ambulatory Visit: Payer: Self-pay

## 2016-11-23 ENCOUNTER — Telehealth: Payer: Self-pay

## 2016-11-23 ENCOUNTER — Other Ambulatory Visit (HOSPITAL_BASED_OUTPATIENT_CLINIC_OR_DEPARTMENT_OTHER): Payer: 59

## 2016-11-23 ENCOUNTER — Ambulatory Visit (HOSPITAL_BASED_OUTPATIENT_CLINIC_OR_DEPARTMENT_OTHER): Payer: 59

## 2016-11-23 VITALS — BP 108/55 | HR 86 | Temp 98.0°F | Resp 18 | Ht 65.0 in | Wt 280.2 lb

## 2016-11-23 DIAGNOSIS — Z95828 Presence of other vascular implants and grafts: Secondary | ICD-10-CM

## 2016-11-23 DIAGNOSIS — C787 Secondary malignant neoplasm of liver and intrahepatic bile duct: Secondary | ICD-10-CM

## 2016-11-23 DIAGNOSIS — I82621 Acute embolism and thrombosis of deep veins of right upper extremity: Secondary | ICD-10-CM | POA: Diagnosis not present

## 2016-11-23 DIAGNOSIS — Z452 Encounter for adjustment and management of vascular access device: Secondary | ICD-10-CM | POA: Diagnosis not present

## 2016-11-23 DIAGNOSIS — C251 Malignant neoplasm of body of pancreas: Secondary | ICD-10-CM

## 2016-11-23 DIAGNOSIS — C259 Malignant neoplasm of pancreas, unspecified: Secondary | ICD-10-CM

## 2016-11-23 DIAGNOSIS — Z7901 Long term (current) use of anticoagulants: Secondary | ICD-10-CM

## 2016-11-23 LAB — COMPREHENSIVE METABOLIC PANEL
ALBUMIN: 3.4 g/dL — AB (ref 3.5–5.0)
ALK PHOS: 114 U/L (ref 40–150)
ALT: 29 U/L (ref 0–55)
AST: 18 U/L (ref 5–34)
Anion Gap: 8 mEq/L (ref 3–11)
BILIRUBIN TOTAL: 0.46 mg/dL (ref 0.20–1.20)
BUN: 8.1 mg/dL (ref 7.0–26.0)
CALCIUM: 9.8 mg/dL (ref 8.4–10.4)
CO2: 29 mEq/L (ref 22–29)
CREATININE: 1 mg/dL (ref 0.6–1.1)
Chloride: 105 mEq/L (ref 98–109)
EGFR: 65 mL/min/{1.73_m2} — ABNORMAL LOW (ref >90)
GLUCOSE: 164 mg/dL — AB (ref 70–140)
Potassium: 4.9 mEq/L (ref 3.5–5.1)
SODIUM: 141 meq/L (ref 136–145)
TOTAL PROTEIN: 6.8 g/dL (ref 6.4–8.3)

## 2016-11-23 LAB — CBC WITH DIFFERENTIAL/PLATELET
BASO%: 1.5 % (ref 0.0–2.0)
BASOS ABS: 0.1 10*3/uL (ref 0.0–0.1)
EOS%: 9 % — AB (ref 0.0–7.0)
Eosinophils Absolute: 0.5 10*3/uL (ref 0.0–0.5)
HEMATOCRIT: 37.5 % (ref 34.8–46.6)
HEMOGLOBIN: 11.7 g/dL (ref 11.6–15.9)
LYMPH%: 25.5 % (ref 14.0–49.7)
MCH: 29.2 pg (ref 25.1–34.0)
MCHC: 31.2 g/dL — ABNORMAL LOW (ref 31.5–36.0)
MCV: 93.5 fL (ref 79.5–101.0)
MONO#: 1 10*3/uL — ABNORMAL HIGH (ref 0.1–0.9)
MONO%: 17.5 % — AB (ref 0.0–14.0)
NEUT%: 46.5 % (ref 38.4–76.8)
NEUTROS ABS: 2.7 10*3/uL (ref 1.5–6.5)
Platelets: 206 10*3/uL (ref 145–400)
RBC: 4.01 10*6/uL (ref 3.70–5.45)
RDW: 17.3 % — AB (ref 11.2–14.5)
WBC: 5.9 10*3/uL (ref 3.9–10.3)
lymph#: 1.5 10*3/uL (ref 0.9–3.3)

## 2016-11-23 MED ORDER — PALONOSETRON HCL INJECTION 0.25 MG/5ML
INTRAVENOUS | Status: AC
  Filled 2016-11-23: qty 5

## 2016-11-23 MED ORDER — SODIUM CHLORIDE 0.9% FLUSH
10.0000 mL | INTRAVENOUS | Status: DC | PRN
Start: 2016-11-23 — End: 2017-06-07
  Administered 2016-11-23: 10 mL via INTRAVENOUS
  Filled 2016-11-23: qty 10

## 2016-11-23 MED ORDER — ALTEPLASE 2 MG IJ SOLR
2.0000 mg | Freq: Once | INTRAMUSCULAR | Status: AC | PRN
  Administered 2016-11-23: 2 mg
  Filled 2016-11-23: qty 2

## 2016-11-23 NOTE — Progress Notes (Signed)
R Port-A-Cath accessed. Appox 5 mls of blod return noted. Site flushes well with no swelling noted at site.  Cath-flo instilled per policy. Will monitor for blood return.

## 2016-11-23 NOTE — Progress Notes (Signed)
chemo was not given today because Port a cath not aspirating-. Alteplase given x 2, two hours apart and unsuccessful -Pt scheduled for dye study in am and aware of appt Chemo r/s for tomorrow.

## 2016-11-23 NOTE — Patient Instructions (Signed)
Implanted Port Home Guide An implanted port is a type of central line that is placed under the skin. Central lines are used to provide IV access when treatment or nutrition needs to be given through a person's veins. Implanted ports are used for long-term IV access. An implanted port may be placed because:  You need IV medicine that would be irritating to the small veins in your hands or arms.  You need long-term IV medicines, such as antibiotics.  You need IV nutrition for a long period.  You need frequent blood draws for lab tests.  You need dialysis.  Implanted ports are usually placed in the chest area, but they can also be placed in the upper arm, the abdomen, or the leg. An implanted port has two main parts:  Reservoir. The reservoir is round and will appear as a small, raised area under your skin. The reservoir is the part where a needle is inserted to give medicines or draw blood.  Catheter. The catheter is a thin, flexible tube that extends from the reservoir. The catheter is placed into a large vein. Medicine that is inserted into the reservoir goes into the catheter and then into the vein.  How will I care for my incision site? Do not get the incision site wet. Bathe or shower as directed by your health care provider. How is my port accessed? Special steps must be taken to access the port:  Before the port is accessed, a numbing cream can be placed on the skin. This helps numb the skin over the port site.  Your health care provider uses a sterile technique to access the port. ? Your health care provider must put on a mask and sterile gloves. ? The skin over your port is cleaned carefully with an antiseptic and allowed to dry. ? The port is gently pinched between sterile gloves, and a needle is inserted into the port.  Only "non-coring" port needles should be used to access the port. Once the port is accessed, a blood return should be checked. This helps ensure that the port  is in the vein and is not clogged.  If your port needs to remain accessed for a constant infusion, a clear (transparent) bandage will be placed over the needle site. The bandage and needle will need to be changed every week, or as directed by your health care provider.  Keep the bandage covering the needle clean and dry. Do not get it wet. Follow your health care provider's instructions on how to take a shower or bath while the port is accessed.  If your port does not need to stay accessed, no bandage is needed over the port.  What is flushing? Flushing helps keep the port from getting clogged. Follow your health care provider's instructions on how and when to flush the port. Ports are usually flushed with saline solution or a medicine called heparin. The need for flushing will depend on how the port is used.  If the port is used for intermittent medicines or blood draws, the port will need to be flushed: ? After medicines have been given. ? After blood has been drawn. ? As part of routine maintenance.  If a constant infusion is running, the port may not need to be flushed.  How long will my port stay implanted? The port can stay in for as long as your health care provider thinks it is needed. When it is time for the port to come out, surgery will be   done to remove it. The procedure is similar to the one performed when the port was put in. When should I seek immediate medical care? When you have an implanted port, you should seek immediate medical care if:  You notice a bad smell coming from the incision site.  You have swelling, redness, or drainage at the incision site.  You have more swelling or pain at the port site or the surrounding area.  You have a fever that is not controlled with medicine.  This information is not intended to replace advice given to you by your health care provider. Make sure you discuss any questions you have with your health care provider. Document  Released: 03/13/2005 Document Revised: 08/19/2015 Document Reviewed: 11/18/2012 Elsevier Interactive Patient Education  2017 [employer].  

## 2016-11-23 NOTE — Telephone Encounter (Signed)
Pt notified of infusion appointment at 0930 on 8/31. Pt verbalizes understanding.

## 2016-11-23 NOTE — Progress Notes (Signed)
[place] OFFICE PROGRESS NOTE   Diagnosis: Pancreas cancer  INTERVAL HISTORY:   Ms. Wohlfarth returns as scheduled. She completed another treatment with gemcitabine/Abraxane on 11/08/2016. No nausea, rash, or fever. Stable numbness in the middle toes bilaterally. This does not interfere with activity. No abdominal pain. She developed pain and a palpable cord at the right upper arm last week. She saw Dr. Maudie Mercury. A Doppler on 11/17/2016 revealed a cephalic vein thrombosis extending from the elbow to the mid upper arm. No evidence of thrombosis in the internal jugular, subclavian, or axillary veins. She reports the pain has improved on Xarelto.  Objective:  Vital signs in last 24 hours:  Blood pressure (!) 108/55, pulse 86, temperature 98 F (36.7 C), temperature source Oral, resp. rate 18, height 5\' 5"  (1.651 m), weight 280 lb 3.2 oz (127.1 kg), SpO2 98 %.    HEENT: No thrush or ulcers Resp: Lungs clear bilaterally Cardio: Regular rate and rhythm GI: No hepatosplenomegaly, nontender Vascular: No leg edema, palpable cord at the medial right upper arm extending from above the elbow to the mid arm. No erythema. No venous engorgement at the right chest or neck.   Portacath/PICC-without erythema  Lab Results:  Lab Results  Component Value Date   WBC 5.9 11/23/2016   HGB 11.7 11/23/2016   HCT 37.5 11/23/2016   MCV 93.5 11/23/2016   PLT 206 11/23/2016   NEUTROABS 2.7 11/23/2016    CMP     Component Value Date/Time   NA 142 11/08/2016 0813   K 4.8 11/08/2016 0813   CL 104 08/07/2016 1037   CO2 28 11/08/2016 0813   GLUCOSE 184 (H) 11/08/2016 0813   BUN 8.7 11/08/2016 0813   CREATININE 0.9 11/08/2016 0813   CALCIUM 9.6 11/08/2016 0813   PROT 6.4 11/08/2016 0813   ALBUMIN 3.4 (L) 11/08/2016 0813   AST 31 11/08/2016 0813   ALT 77 (H) 11/08/2016 0813   ALKPHOS 105 11/08/2016 0813   BILITOT 0.35 11/08/2016 0813   GFRNONAA >60 06/27/2016 1030   GFRAA >60 06/27/2016  1030     Medications: I have reviewed the patient's current medications.  Assessment/Plan: 1. Metastatic pancreas cancer ? Pancreas body mass and liver metastases noted on CT abdomen/pelvis 08/11/2016 ? Ultrasound-guided biopsy of a right liver lesion 08/16/2016 revealed poorly differentiated adenocarcinoma consistent with pancreas cancer ? Cycle 1 gemcitabine/Abraxane 09/13/2016; 09/29/2016 ? Cycle 2 gemcitabine/Abraxane 10/11/2016, 10/18/2016 ? Cycle 3 gemcitabine/Abraxane 11/01/2016, 11/08/2016 ? Cycle 4 gemcitabine/Abraxane 11/23/2016  2. Right posterior lateral chest pain-potentially related to a lateral liver metastasis-resolved  3. Hypertension  4. Sleep apnea  5. Chronic low back pain  6. Recurrent urinary tract infections  7. Depression  8. Migraine headaches  9. Port-A-Cath placement 08/28/2016  10. Rash following cycle 1 gemcitabine/Abraxane-drug rash?  11. Nausea/vomiting following cycle 1 gemcitabine/Abraxane-antiemetic regimen adjusted with addition of Aloxi  12. Diabetes  13. Right cephalic vein thrombosis 66/29/[id number] with Xarelto    Disposition:  Ms. Munoz has competed 3 cycles of gemcitabine/Abraxane. She is tolerating the chemotherapy well. The plan is to proceed with cycle 4 today. The CA 19-9 is lower and these abdominal pain has resolved. She'll be scheduled for a restaging CT after this cycle.  She has been diagnosed with a right upper extremity superficial vein thrombosis. This occurs in the setting of metastatic pancreas cancer and an indwelling Port-A-Cath. I recommend continuing indefinite Xarelto anticoagulation.  Ms. Starnes will return for an office visit 12/13/2016.  25 minutes were spent  with the patient today. The majority of the time was used for counseling and coordination of care.  Donneta Romberg, MD  11/23/2016  8:57 AM

## 2016-11-23 NOTE — Patient Instructions (Addendum)
[facility] Discharge Instructions for Patients Receiving Chemotherapy  Today you received the following chemotherapy agents Abraxane and Gemzar To help prevent nausea and vomiting after your treatment, we encourage you to take your nausea medication    prochlorperazine (COMPAZINE) 10 MG tablet 30 tablet 2 08/29/2016    Sig - Route: Take 1 tablet (10 mg total) by mouth every 6 (six) hours as needed for nausea or vomiting. - Oral    ondansetron (ZOFRAN-ODT) 8 MG disintegrating tablet 30 tablet 1 09/18/2016    Sig - Route: Take 1 tablet (8 mg total) by mouth every 8 (eight) hours as needed for nausea or vomiting. - Oral     If you develop nausea and vomiting that is not controlled by your nausea medication, call the clinic.   BELOW ARE SYMPTOMS THAT SHOULD BE REPORTED IMMEDIATELY:  *FEVER GREATER THAN 100.5 F  *CHILLS WITH OR WITHOUT FEVER  NAUSEA AND VOMITING THAT IS NOT CONTROLLED WITH YOUR NAUSEA MEDICATION  *UNUSUAL SHORTNESS OF BREATH  *UNUSUAL BRUISING OR BLEEDING  TENDERNESS IN MOUTH AND THROAT WITH OR WITHOUT PRESENCE OF ULCERS  *URINARY PROBLEMS  *BOWEL PROBLEMS  UNUSUAL RASH Items with * indicate a potential emergency and should be followed up as soon as possible.  Feel free to call the clinic you have any questions or concerns. The clinic phone number is [phone].  Please show the [employer] at check-in to the Emergency Department and triage nurse.

## 2016-11-23 NOTE — Telephone Encounter (Signed)
Call placed to confirm appointment with patient for dye study tomorrow in radiology. Pt verbalizes understanding and agrees with plan of care.

## 2016-11-23 NOTE — Telephone Encounter (Signed)
Gave patient avs and calendar with appts.  °

## 2016-11-24 ENCOUNTER — Ambulatory Visit (HOSPITAL_COMMUNITY)
Admission: RE | Admit: 2016-11-24 | Discharge: 2016-11-24 | Disposition: A | Discharge status: Home / Self Care | Payer: 59 | Source: Ambulatory Visit | Attending: Oncology | Admitting: Oncology

## 2016-11-24 ENCOUNTER — Telehealth: Payer: Self-pay | Admitting: Medical Oncology

## 2016-11-24 ENCOUNTER — Encounter (HOSPITAL_COMMUNITY): Payer: Self-pay | Admitting: Interventional Radiology

## 2016-11-24 ENCOUNTER — Ambulatory Visit (HOSPITAL_BASED_OUTPATIENT_CLINIC_OR_DEPARTMENT_OTHER): Payer: 59

## 2016-11-24 ENCOUNTER — Other Ambulatory Visit: Payer: Self-pay | Admitting: Oncology

## 2016-11-24 DIAGNOSIS — Z452 Encounter for adjustment and management of vascular access device: Secondary | ICD-10-CM

## 2016-11-24 DIAGNOSIS — C787 Secondary malignant neoplasm of liver and intrahepatic bile duct: Secondary | ICD-10-CM

## 2016-11-24 DIAGNOSIS — Z5111 Encounter for antineoplastic chemotherapy: Secondary | ICD-10-CM

## 2016-11-24 DIAGNOSIS — C259 Malignant neoplasm of pancreas, unspecified: Secondary | ICD-10-CM

## 2016-11-24 DIAGNOSIS — Z95828 Presence of other vascular implants and grafts: Secondary | ICD-10-CM

## 2016-11-24 DIAGNOSIS — C251 Malignant neoplasm of body of pancreas: Secondary | ICD-10-CM

## 2016-11-24 HISTORY — PX: IR CV LINE INJECTION: IMG2294

## 2016-11-24 LAB — CANCER ANTIGEN 19-9: CAN 19-9: 733 U/mL — AB (ref 0–35)

## 2016-11-24 MED ORDER — PALONOSETRON HCL INJECTION 0.25 MG/5ML
0.2500 mg | Freq: Once | INTRAVENOUS | Status: AC
  Administered 2016-11-24: 0.25 mg via INTRAVENOUS

## 2016-11-24 MED ORDER — IOPAMIDOL (ISOVUE-300) INJECTION 61%
INTRAVENOUS | Status: AC
Start: 2016-11-24 — End: 2016-11-24
  Administered 2016-11-24: 10 mL
  Filled 2016-11-24: qty 50

## 2016-11-24 MED ORDER — HEPARIN SOD (PORK) LOCK FLUSH 100 UNIT/ML IV SOLN
INTRAVENOUS | Status: AC
  Filled 2016-11-24: qty 5

## 2016-11-24 MED ORDER — SODIUM CHLORIDE 0.9 % IV SOLN
Freq: Once | INTRAVENOUS | Status: AC
  Administered 2016-11-24: 10:00:00 via INTRAVENOUS

## 2016-11-24 MED ORDER — HEPARIN SOD (PORK) LOCK FLUSH 100 UNIT/ML IV SOLN
500.0000 [IU] | Freq: Once | INTRAVENOUS | Status: AC | PRN
  Administered 2016-11-24: 500 [IU] via INTRAVENOUS
  Filled 2016-11-24: qty 5

## 2016-11-24 MED ORDER — DEXAMETHASONE SODIUM PHOSPHATE 10 MG/ML IJ SOLN
5.0000 mg | Freq: Once | INTRAMUSCULAR | Status: AC
  Administered 2016-11-24: 5 mg via INTRAVENOUS

## 2016-11-24 MED ORDER — PACLITAXEL PROTEIN-BOUND CHEMO INJECTION 100 MG
100.0000 mg/m2 | Freq: Once | INTRAVENOUS | Status: AC
  Administered 2016-11-24: 250 mg via INTRAVENOUS
  Filled 2016-11-24: qty 50

## 2016-11-24 MED ORDER — SODIUM CHLORIDE 0.9 % IV SOLN
2000.0000 mg | Freq: Once | INTRAVENOUS | Status: AC
  Administered 2016-11-24: 2000 mg via INTRAVENOUS
  Filled 2016-11-24: qty 52.6

## 2016-11-24 MED ORDER — DEXAMETHASONE SODIUM PHOSPHATE 10 MG/ML IJ SOLN
INTRAMUSCULAR | Status: AC
  Filled 2016-11-24: qty 1

## 2016-11-24 MED ORDER — ALTEPLASE 2 MG IJ SOLR
2.0000 mg | Freq: Once | INTRAMUSCULAR | Status: AC | PRN
  Administered 2016-11-24: 2 mg
  Filled 2016-11-24: qty 2

## 2016-11-24 MED ORDER — PALONOSETRON HCL INJECTION 0.25 MG/5ML
INTRAVENOUS | Status: AC
  Filled 2016-11-24: qty 5

## 2016-11-24 MED ORDER — SODIUM CHLORIDE 0.9% FLUSH
10.0000 mL | INTRAVENOUS | Status: DC | PRN
  Administered 2016-11-24: 10 mL via INTRAVENOUS
  Filled 2016-11-24: qty 10

## 2016-11-24 NOTE — Progress Notes (Signed)
Dye study result discussed with pt. Informed her catheter is in good position, fibrin sheath remains. Alteplase instilled per protocol. Pt agreed to peripheral IV for treatment today while TPA dwells.

## 2016-11-24 NOTE — Patient Instructions (Signed)
[facility] Discharge Instructions for Patients Receiving Chemotherapy  Today you received the following chemotherapy agents: Abraxane and Gemzar.  To help prevent nausea and vomiting after your treatment, we encourage you to take your nausea medication: Compazine. Take one every 6 hours as needed. If you develop nausea and vomiting that is not controlled by your nausea medication, call the clinic.   BELOW ARE SYMPTOMS THAT SHOULD BE REPORTED IMMEDIATELY:  *FEVER GREATER THAN 100.5 F  *CHILLS WITH OR WITHOUT FEVER  NAUSEA AND VOMITING THAT IS NOT CONTROLLED WITH YOUR NAUSEA MEDICATION  *UNUSUAL SHORTNESS OF BREATH  *UNUSUAL BRUISING OR BLEEDING  TENDERNESS IN MOUTH AND THROAT WITH OR WITHOUT PRESENCE OF ULCERS  *URINARY PROBLEMS  *BOWEL PROBLEMS  UNUSUAL RASH Items with * indicate a potential emergency and should be followed up as soon as possible.  Feel free to call the clinic should you have any questions or concerns. The clinic phone number is [phone].  Please show the [facility] at check-in to the Emergency Department and triage nurse.

## 2016-11-24 NOTE — Telephone Encounter (Signed)
PEr Dr Valora Corporal port is okay to use. There is a small fibrin sheath and they cannot aspirate the port, but it is in great position". They will send pt over with port accessed.

## 2016-11-28 ENCOUNTER — Other Ambulatory Visit: Payer: Self-pay | Admitting: Oncology

## 2016-11-29 ENCOUNTER — Ambulatory Visit (HOSPITAL_BASED_OUTPATIENT_CLINIC_OR_DEPARTMENT_OTHER): Payer: 59

## 2016-11-29 ENCOUNTER — Other Ambulatory Visit (HOSPITAL_BASED_OUTPATIENT_CLINIC_OR_DEPARTMENT_OTHER): Payer: 59

## 2016-11-29 ENCOUNTER — Ambulatory Visit: Payer: 59

## 2016-11-29 VITALS — BP 131/61 | HR 88 | Temp 98.5°F | Resp 18

## 2016-11-29 DIAGNOSIS — Z95828 Presence of other vascular implants and grafts: Secondary | ICD-10-CM

## 2016-11-29 DIAGNOSIS — C787 Secondary malignant neoplasm of liver and intrahepatic bile duct: Secondary | ICD-10-CM

## 2016-11-29 DIAGNOSIS — Z5111 Encounter for antineoplastic chemotherapy: Secondary | ICD-10-CM

## 2016-11-29 DIAGNOSIS — C259 Malignant neoplasm of pancreas, unspecified: Secondary | ICD-10-CM

## 2016-11-29 DIAGNOSIS — C251 Malignant neoplasm of body of pancreas: Secondary | ICD-10-CM

## 2016-11-29 LAB — CBC WITH DIFFERENTIAL/PLATELET
BASO%: 1.7 % (ref 0.0–2.0)
BASOS ABS: 0.1 10*3/uL (ref 0.0–0.1)
EOS ABS: 0.3 10*3/uL (ref 0.0–0.5)
EOS%: 7.5 % — AB (ref 0.0–7.0)
HCT: 32 % — ABNORMAL LOW (ref 34.8–46.6)
HGB: 10.1 g/dL — ABNORMAL LOW (ref 11.6–15.9)
LYMPH%: 27.3 % (ref 14.0–49.7)
MCH: 29.3 pg (ref 25.1–34.0)
MCHC: 31.6 g/dL (ref 31.5–36.0)
MCV: 92.8 fL (ref 79.5–101.0)
MONO#: 0.1 10*3/uL (ref 0.1–0.9)
MONO%: 3.1 % (ref 0.0–14.0)
NEUT#: 2.5 10*3/uL (ref 1.5–6.5)
NEUT%: 60.4 % (ref 38.4–76.8)
Platelets: 166 10*3/uL (ref 145–400)
RBC: 3.45 10*6/uL — AB (ref 3.70–5.45)
RDW: 16.7 % — AB (ref 11.2–14.5)
WBC: 4.1 10*3/uL (ref 3.9–10.3)
lymph#: 1.1 10*3/uL (ref 0.9–3.3)

## 2016-11-29 LAB — COMPREHENSIVE METABOLIC PANEL
ALBUMIN: 3.2 g/dL — AB (ref 3.5–5.0)
ALT: 74 U/L — ABNORMAL HIGH (ref 0–55)
AST: 28 U/L (ref 5–34)
Alkaline Phosphatase: 91 U/L (ref 40–150)
Anion Gap: 8 mEq/L (ref 3–11)
BUN: 8.8 mg/dL (ref 7.0–26.0)
CHLORIDE: 105 meq/L (ref 98–109)
CO2: 27 meq/L (ref 22–29)
Calcium: 9.1 mg/dL (ref 8.4–10.4)
Creatinine: 0.9 mg/dL (ref 0.6–1.1)
EGFR: 72 mL/min/{1.73_m2} — AB (ref >90)
Glucose: 191 mg/dl — ABNORMAL HIGH (ref 70–140)
POTASSIUM: 4.1 meq/L (ref 3.5–5.1)
SODIUM: 139 meq/L (ref 136–145)
Total Bilirubin: 0.56 mg/dL (ref 0.20–1.20)
Total Protein: 6.3 g/dL — ABNORMAL LOW (ref 6.4–8.3)

## 2016-11-29 MED ORDER — DEXAMETHASONE SODIUM PHOSPHATE 10 MG/ML IJ SOLN
5.0000 mg | Freq: Once | INTRAMUSCULAR | Status: AC
  Administered 2016-11-29: 5 mg via INTRAVENOUS

## 2016-11-29 MED ORDER — SODIUM CHLORIDE 0.9% FLUSH
10.0000 mL | INTRAVENOUS | Status: DC | PRN
  Administered 2016-11-29: 10 mL
  Filled 2016-11-29: qty 10

## 2016-11-29 MED ORDER — HEPARIN SOD (PORK) LOCK FLUSH 100 UNIT/ML IV SOLN
500.0000 [IU] | Freq: Once | INTRAVENOUS | Status: AC | PRN
  Administered 2016-11-29: 500 [IU]
  Filled 2016-11-29: qty 5

## 2016-11-29 MED ORDER — SODIUM CHLORIDE 0.9 % IV SOLN
2000.0000 mg | Freq: Once | INTRAVENOUS | Status: AC
  Administered 2016-11-29: 2000 mg via INTRAVENOUS
  Filled 2016-11-29: qty 52.6

## 2016-11-29 MED ORDER — SODIUM CHLORIDE 0.9% FLUSH
10.0000 mL | INTRAVENOUS | Status: DC | PRN
  Administered 2016-11-29: 10 mL via INTRAVENOUS
  Filled 2016-11-29: qty 10

## 2016-11-29 MED ORDER — PALONOSETRON HCL INJECTION 0.25 MG/5ML
INTRAVENOUS | Status: AC
  Filled 2016-11-29: qty 5

## 2016-11-29 MED ORDER — DEXAMETHASONE SODIUM PHOSPHATE 10 MG/ML IJ SOLN
INTRAMUSCULAR | Status: AC
  Filled 2016-11-29: qty 1

## 2016-11-29 MED ORDER — SODIUM CHLORIDE 0.9 % IV SOLN
Freq: Once | INTRAVENOUS | Status: AC
  Administered 2016-11-29: 09:00:00 via INTRAVENOUS

## 2016-11-29 MED ORDER — PALONOSETRON HCL INJECTION 0.25 MG/5ML
0.2500 mg | Freq: Once | INTRAVENOUS | Status: AC
  Administered 2016-11-29: 0.25 mg via INTRAVENOUS

## 2016-11-29 MED ORDER — PACLITAXEL PROTEIN-BOUND CHEMO INJECTION 100 MG
100.0000 mg/m2 | Freq: Once | INTRAVENOUS | Status: AC
  Administered 2016-11-29: 250 mg via INTRAVENOUS
  Filled 2016-11-29: qty 50

## 2016-11-29 NOTE — Patient Instructions (Signed)
[facility] Discharge Instructions for Patients Receiving Chemotherapy  Today you received the following chemotherapy agents: Abraxane and Gemzar   To help prevent nausea and vomiting after your treatment, we encourage you to take your nausea medication as directed.    If you develop nausea and vomiting that is not controlled by your nausea medication, call the clinic.   BELOW ARE SYMPTOMS THAT SHOULD BE REPORTED IMMEDIATELY:  *FEVER GREATER THAN 100.5 F  *CHILLS WITH OR WITHOUT FEVER  NAUSEA AND VOMITING THAT IS NOT CONTROLLED WITH YOUR NAUSEA MEDICATION  *UNUSUAL SHORTNESS OF BREATH  *UNUSUAL BRUISING OR BLEEDING  TENDERNESS IN MOUTH AND THROAT WITH OR WITHOUT PRESENCE OF ULCERS  *URINARY PROBLEMS  *BOWEL PROBLEMS  UNUSUAL RASH Items with * indicate a potential emergency and should be followed up as soon as possible.  Feel free to call the clinic you have any questions or concerns. The clinic phone number is [phone].  Please show the CHEMO ALERT CARD at check-in to the Emergency Department and triage nurse.   

## 2016-12-05 ENCOUNTER — Ambulatory Visit (INDEPENDENT_AMBULATORY_CARE_PROVIDER_SITE_OTHER): Payer: 59 | Admitting: Nurse Practitioner

## 2016-12-05 ENCOUNTER — Encounter: Payer: Self-pay | Admitting: Nurse Practitioner

## 2016-12-05 VITALS — BP 102/70 | HR 80 | Ht 63.0 in | Wt 278.8 lb

## 2016-12-05 DIAGNOSIS — R079 Chest pain, unspecified: Secondary | ICD-10-CM

## 2016-12-05 DIAGNOSIS — R0602 Shortness of breath: Secondary | ICD-10-CM

## 2016-12-05 DIAGNOSIS — R002 Palpitations: Secondary | ICD-10-CM | POA: Diagnosis not present

## 2016-12-05 NOTE — Addendum Note (Signed)
Addended by: Harland German A on: 12/05/2016 03:45 PM   Modules accepted: Orders

## 2016-12-05 NOTE — Patient Instructions (Addendum)
We will be checking the following labs today - NONE   Medication Instructions:    Continue with your current medicines.     Testing/Procedures To Be Arranged:  CT of the chest with contrast  48 hour Holter  Follow-Up:   Will get you back to see Dr. Tamala Julian next month - will be a different day.    Other Special Instructions:   N/A    If you need a refill on your cardiac medications before your next appointment, please call your pharmacy.   Call the [place] office at [phone] if you have any questions, problems or concerns.

## 2016-12-05 NOTE — Progress Notes (Signed)
CARDIOLOGY OFFICE NOTE  Date:  12/05/2016    [name] Date of Birth: 05-Apr-1955 Medical Record #[id number]  PCP:  [name], MD  Cardiologist:  Tamala Julian  Chief Complaint  Patient presents with  . Palpitations  . Shortness of Breath    Work in visit - seen for Dr. Tamala Julian    History of Present Illness: Heather Frederick is a 61 y.o. female who presents today for a work in visit. Seen for Dr. Tamala Julian.   She has a history of obesity, diastolic heart failure, obstructive sleep apnea, and noncardiac chest pain. She has clean coronaries by cath.   She was last seen back in December of 2017 and noted to have multiple complaints. Excessive daytime sleepiness is occurring. She has difficulty with her C Pap equipment but is compliant. Atypical chest pain noted.   Since her last visit here she was found to have metastatic pancreatic cancer back in May. She is on indefinite Xarelto - has had superficial vein thrombosis noted. She is followed by Dr. Benay Spice. She is currently undergoing chemotherapy.   Comes in today. Here alone.  She says her chemo is going ok and that "her numbers are going down" and she is tolerating ok. She is tired and has no energy. With the last 2 treatments - so "sick" and was vomiting with cold chills and sweats. This has improved. She has been short of breath - it is progressive. Says she has been talking in "broken sentences".  Worse with minimal exertion. Does not look like she discussed any of this with Dr. Benay Spice at her last visit with him. Sees him again later this month. Saw her PCP yesterday and was told to come here and not to wait til she saw Dr. Tamala Julian next month (that appointment was cancelled). She has continued to have some chest pains - this is chronic. She feels her heart racing - happens almost every day - takes her breath away. Not really dizzy but more short of breath with this sensation. Asking me if her chemo treatments get "easier" the more you get.  No real cough. No hemoptysis. She is on DVT dosing of Xarelto at this time.   Past Medical History:  Diagnosis Date  . Allergy   . Anxiety   . Arthritis   . Benign essential HTN 09/23/2014  . Cancer ([place])   . Chronic kidney disease    uti  . Depression   . Dyslipidemia   . Elevated liver enzymes   . Family history of anesthesia complication    father has a severe hard time waking up  . Gallstones    a. Seen on CT 01/2014.  Marland Kitchen GERD (gastroesophageal reflux disease)   . Hard of hearing   . Hepatic steatosis   . History of frequent urinary tract infections   . Hyperlipidemia   . Hypertension   . Lateral epicondylitis of right elbow   . Mental disorder   . Meralgia paresthetica of right side 10/02/2012   slight at 05/2014  . Migraine headache   . Obesity   . OSA (obstructive sleep apnea)    severe with AHI 37/hr now on CPAP at 18cm H2O  . Osteoarthritis   . Pneumonia    Feb 2018  . Raynaud disease    in feet per patient   . Sleep apnea    wears c-pap  . Urinary tract infection     Past Surgical History:  Procedure Laterality Date  .  ABDOMINAL HYSTERECTOMY  1/04   partial  . BLADDER SUSPENSION  6/10  . CARDIAC CATHETERIZATION    . carpel tunnel left/right  7/08, 8/08 Bilateral 8/08 and 7/08  . carpel tunnel rel Right 4/12  . CHOLECYSTECTOMY N/A 06/11/2014   Procedure: LAPAROSCOPIC CHOLECYSTECTOMY WITH ATTEMPTED INTRAOPERATIVE CHOLANGIOGRAM;  Surgeon: Jackolyn Confer, MD;  Location: [facility];  Service: General;  Laterality: N/A;  . COLONOSCOPY    . ERCP N/A 10/19/2014   Procedure: ENDOSCOPIC RETROGRADE CHOLANGIOPANCREATOGRAPHY (ERCP);  Surgeon: Ladene Artist, MD;  Location: [facility];  Service: Endoscopy;  Laterality: N/A;  . INCISIONAL HERNIA REPAIR N/A 06/30/2016   Procedure: LAPAROSCOPIC REPAIR OF INCISIONAL HERNIA WITH MESH;  Surgeon: Jackolyn Confer, MD;  Location: [facility];  Service: General;  Laterality: N/A;  . INSERTION OF MESH N/A 06/30/2016   Procedure: INSERTION  OF MESH;  Surgeon: Jackolyn Confer, MD;  Location: [facility];  Service: General;  Laterality: N/A;  . IR CV LINE INJECTION  11/24/2016  . IR FLUORO GUIDE PORT INSERTION RIGHT  08/28/2016  . IR US GUIDE VASC ACCESS RIGHT  08/28/2016  . JOINT REPLACEMENT    . KNEE ARTHROSCOPY Left 12/12  . KNEE ARTHROSCOPY Right 12/06  . LEFT HEART CATHETERIZATION WITH CORONARY ANGIOGRAM N/A 03/10/2014   Procedure: LEFT HEART CATHETERIZATION WITH CORONARY ANGIOGRAM;  Surgeon: Sinclair Grooms, MD;  Location: [facility];  Service: Cardiovascular;  Laterality: N/A;  . PLANTAR FASCIA RELEASE Right 12/10  . radial tunnel release     right arm   . ROTATOR CUFF REPAIR Left 6/11  . tennis elbow release Right 7/04  . TOTAL KNEE ARTHROPLASTY Left 09/10/2012   Procedure: TOTAL KNEE ARTHROPLASTY- left;  Surgeon: Garald Balding, MD;  Location: [place];  Service: Orthopedics;  Laterality: Left;  Left total knee arthroplasty     Medications: Current Meds  Medication Sig  . acetaminophen (TYLENOL) 650 MG CR tablet Take 1,300 mg by mouth every 8 (eight) hours as needed for pain.  Marland Kitchen aspirin EC 81 MG tablet Take 81 mg by mouth daily.  . citalopram (CELEXA) 20 MG tablet TAKE ONE (1) TABLET BY MOUTH EVERY DAY  . Cranberry-Vitamin C-Vitamin E (CRANBERRY PLUS VITAMIN C PO) Take 1 tablet by mouth daily. 4200 mg  . dexamethasone (DECADRON) 2 MG tablet Take 4 mg by mouth See admin instructions. Taken at time of chemo treatments.  Marland Kitchen eletriptan (RELPAX) 40 MG tablet Take 1 tablet (40 mg total) by mouth every 2 (two) hours as needed for migraine.  . Insulin Glargine (TOUJEO SOLOSTAR [place]) Inject 5 Units into the skin at bedtime.  . insulin lispro (HUMALOG) 100 UNIT/ML injection Inject into the skin 3 (three) times daily before meals. Only takes when glucose is "very high"  . lidocaine-prilocaine (EMLA) cream Apply 1 application topically as needed. Apply to [facility] a Cath site one hour prior to needle stick.  Marland Kitchen loratadine (CLARITIN) 10 MG  tablet Take 10-20 mg by mouth 2 (two) times daily as needed for allergies.   Marland Kitchen LORazepam (ATIVAN) 0.5 MG tablet Take 1 tablet (0.5 mg total) by mouth every 8 (eight) hours as needed (for nausea).  . nitrofurantoin (MACRODANTIN) 100 MG capsule Take 100 mg by mouth daily.   . nitroGLYCERIN (NITROSTAT) 0.4 MG SL tablet Place 1 tablet (0.4 mg total) under the tongue every 5 (five) minutes as needed for chest pain.  Marland Kitchen ondansetron (ZOFRAN-ODT) 8 MG disintegrating tablet Take 1 tablet (8 mg total) by mouth every 8 (eight)  hours as needed for nausea or vomiting.  Marland Kitchen oxybutynin (DITROPAN-XL) 10 MG 24 hr tablet Take 10 mg by mouth every morning.   . pantoprazole (PROTONIX) 40 MG tablet TAKE ONE (1) TABLET BY MOUTH TWO (2) TIMES DAILY  . polyethylene glycol (MIRALAX / GLYCOLAX) packet Take 17 g by mouth daily.  . pregabalin (LYRICA) 200 MG capsule TAKE ONE (1) CAPSULE BY MOUTH EVERY MORNING  . ranitidine (ZANTAC) 300 MG tablet TAKE ONE TABLET BY MOUTH EVERY EVENING  . Rivaroxaban (XARELTO) 15 MG TABS tablet Take 15 mg by mouth 2 (two) times daily with a meal. For 3 weeks, then take 20mg  once daily for 1 week     Allergies: Allergies  Allergen Reactions  . Topamax [Topiramate] Other (See Comments)    :Stroke like symptoms  . Aleve [Naproxen Sodium] Hives    Has tolerated Voltaren topical as well as aspirin.  . Bee Venom Swelling  . Echinacea Hives  . Other Other (See Comments)    Feathers cause sinus congestion  . Sulfa Antibiotics Hives  . Advil [Ibuprofen] Hives    Has tolerated Voltaren topical as well as aspirin.    Social History: The patient  reports that she has never smoked. She has never used smokeless tobacco. She reports that she drinks alcohol. She reports that she does not use drugs.   Family History: The patient's family history includes Breast cancer in her cousin and other; Clotting disorder in her father; Colon cancer in her paternal grandmother; Coronary artery disease in her  father; Hypertension in her brother, father, and mother; Kidney failure in her brother; Liver disease in her mother; Migraines in her brother, daughter, and father; Pancreatic cancer in her paternal grandmother; Stomach cancer in her paternal grandmother; Stroke in her mother.   Review of Systems: Please see the history of present illness.   Otherwise, the review of systems is positive for none.   All other systems are reviewed and negative.   Physical Exam: VS:  BP 102/70   Pulse 80   Ht 5\' 3"  (1.6 m)   Wt 278 lb 12.8 oz (126.5 kg)   SpO2 94%   BMI 49.39 kg/m  .  BMI Body mass index is 49.39 kg/m.  Wt Readings from Last 3 Encounters:  12/05/16 278 lb 12.8 oz (126.5 kg)  11/23/16 280 lb 3.2 oz (127.1 kg)  11/01/16 281 lb 4.8 oz (127.6 kg)    General: Looks older than her stated age. She is short of breath with activity. She is in no acute distress.  Color is sallow. She is obese.  HEENT: Normal but she has lost her hair.   Neck: Supple, no JVD, carotid bruits, or masses noted.  Cardiac: Regular rate and rhythm. No murmurs, rubs, or gallops. No edema.  Respiratory:  Lungs are clear to auscultation bilaterally with normal work of breathing.  GI: Soft and nontender. Obese.  MS: No deformity or atrophy. Gait and ROM intact.  Skin: Warm and dry. Color is normal.  Neuro:  Strength and sensation are intact and no gross focal deficits noted.  Psych: Alert, appropriate and with normal affect.   LABORATORY DATA:  EKG:  EKG is ordered today. This demonstrates NSR.  Lab Results  Component Value Date   WBC 4.1 11/29/2016   HGB 10.1 (L) 11/29/2016   HCT 32.0 (L) 11/29/2016   PLT 166 11/29/2016   GLUCOSE 191 (H) 11/29/2016   CHOL 146 07/28/2015   TRIG 133.0 07/28/2015   HDL  31.30 (L) 07/28/2015   LDLCALC 88 07/28/2015   ALT 74 (H) 11/29/2016   AST 28 11/29/2016   NA 139 11/29/2016   K 4.1 11/29/2016   CL 104 08/07/2016   CREATININE 0.9 11/29/2016   BUN 8.8 11/29/2016   CO2 27  11/29/2016   INR 1.11 08/28/2016   HGBA1C 7.2 (H) 09/29/2016     BNP (last 3 results) No results for input(s): BNP in the last 8760 hours.  ProBNP (last 3 results) No results for input(s): PROBNP in the last 8760 hours.   Other Studies Reviewed Today:  Echo Study Conclusions 02/2016  - Left ventricle: The cavity size was normal. Systolic function was   normal. The estimated ejection fraction was in the range of 60%   to 65%. Wall motion was normal; there were no regional wall   motion abnormalities. Doppler parameters are consistent with   abnormal left ventricular relaxation (grade 1 diastolic   dysfunction). There were intermediate ventricular filling   pressure by Doppler parameters. - Aortic valve: Poorly visualized. Transvalvular velocity was   within the normal range. There was no stenosis. Aortic   regurgitation couldn&'t be evaluated. - Aortic root: The aortic root was normal in size. - Left atrium: The atrium was normal in size. - Right ventricle: The cavity size was normal. Wall thickness was   normal. Systolic function was normal. - Pulmonary arteries: Not visualized.  Impressions:  - This was a very limited quality study. Overall, there appears to   be normal bibventricular size and systolic function.   There is impaired relaxation with intermediate left ventricular   filling pressures.   RVSP couldn&'t be assessed as there was no tricuspid regurgitation   seen.  Assessment/Plan:  1. Shortness of breath - most likely multifactorial - she is currently being treated for metastatic pancreatic cancer/low counts/deconditioning/obesity, etc.  She has a superficial DVT. Could certainly have a PE but already on Xarelto. Will arrange for CT scan of the chest.   2.  Palpitations - again, probably multifactorial. BP soft. Not able to add beta blocker. Will check 48 hour holter to make sure nothing too worrisome going on. EKG is ok.   3. Metastatic pancreatic cancer  - currently on treatment. Explained that typically the treatments can get a little harder to tolerate - she needs to talk with Dr.   4. OSA on CPAP  5. Obesity  6. HTN - BP now soft.   7. Atypical chest pain with no CAD noted by prior cath.   Current medicines are reviewed with the patient today.  The patient does not have concerns regarding medicines other than what has been noted above.  The following changes have been made:  See above.  Labs/ tests ordered today include:    Orders Placed This Encounter  Procedures  . CT Chest W Contrast  . Holter monitor - 48 hour     Disposition:   Further disposition pending. She sees Dr. Tamala Julian next month.   Patient is agreeable to this plan and will call if any problems develop in the interim.   SignedTruitt Merle, NP  12/05/2016 3:20 PM  [facility] [address] [place], [place]  [address] Phone: [phone] Fax: [phone]

## 2016-12-06 ENCOUNTER — Telehealth: Payer: Self-pay | Admitting: *Deleted

## 2016-12-06 ENCOUNTER — Ambulatory Visit (INDEPENDENT_AMBULATORY_CARE_PROVIDER_SITE_OTHER)
Admission: RE | Admit: 2016-12-06 | Discharge: 2016-12-06 | Disposition: A | Discharge status: Home / Self Care | Payer: 59 | Source: Ambulatory Visit | Attending: Nurse Practitioner | Admitting: Nurse Practitioner

## 2016-12-06 DIAGNOSIS — R079 Chest pain, unspecified: Secondary | ICD-10-CM

## 2016-12-06 DIAGNOSIS — R0602 Shortness of breath: Secondary | ICD-10-CM | POA: Diagnosis not present

## 2016-12-06 MED ORDER — IOPAMIDOL (ISOVUE-300) INJECTION 61%
80.0000 mL | Freq: Once | INTRAVENOUS | Status: AC | PRN
  Administered 2016-12-06: 80 mL via INTRAVENOUS

## 2016-12-06 NOTE — Telephone Encounter (Signed)
Notes recorded by Burtis Junes, NP on 12/06/2016 at 11:25 AM EDT Please call.  Please send copy to Dr. Benay Spice. Please order echo to follow up on pericardial effusion - looks small on CT Several nodular opacities in the lungs noted. Would like for Dr. Benay Spice to review.  Would proceed with the echo and Holter as noted and otherwise defer to Dr. Benay Spice.

## 2016-12-06 NOTE — Telephone Encounter (Signed)
-----   Message from Katrine Coho, RN sent at 12/06/2016 11:41 AM EDT ----- Please contact pt to schedule echo per Truitt Merle. Pt is expecting your call. Thanks [employer]

## 2016-12-08 ENCOUNTER — Other Ambulatory Visit: Payer: Self-pay | Admitting: Oncology

## 2016-12-13 ENCOUNTER — Ambulatory Visit: Payer: 59

## 2016-12-13 ENCOUNTER — Ambulatory Visit (HOSPITAL_BASED_OUTPATIENT_CLINIC_OR_DEPARTMENT_OTHER): Payer: 59

## 2016-12-13 ENCOUNTER — Other Ambulatory Visit (HOSPITAL_BASED_OUTPATIENT_CLINIC_OR_DEPARTMENT_OTHER): Payer: 59

## 2016-12-13 ENCOUNTER — Telehealth: Payer: Self-pay | Admitting: Nurse Practitioner

## 2016-12-13 ENCOUNTER — Telehealth: Payer: Self-pay | Admitting: Emergency Medicine

## 2016-12-13 ENCOUNTER — Ambulatory Visit (HOSPITAL_BASED_OUTPATIENT_CLINIC_OR_DEPARTMENT_OTHER): Payer: 59 | Admitting: Nurse Practitioner

## 2016-12-13 VITALS — BP 122/66 | HR 75 | Temp 98.6°F | Resp 20 | Wt 281.6 lb

## 2016-12-13 DIAGNOSIS — C787 Secondary malignant neoplasm of liver and intrahepatic bile duct: Secondary | ICD-10-CM | POA: Diagnosis not present

## 2016-12-13 DIAGNOSIS — E119 Type 2 diabetes mellitus without complications: Secondary | ICD-10-CM | POA: Diagnosis not present

## 2016-12-13 DIAGNOSIS — C259 Malignant neoplasm of pancreas, unspecified: Secondary | ICD-10-CM

## 2016-12-13 DIAGNOSIS — Z5111 Encounter for antineoplastic chemotherapy: Secondary | ICD-10-CM

## 2016-12-13 DIAGNOSIS — I10 Essential (primary) hypertension: Secondary | ICD-10-CM | POA: Diagnosis not present

## 2016-12-13 DIAGNOSIS — C251 Malignant neoplasm of body of pancreas: Secondary | ICD-10-CM

## 2016-12-13 DIAGNOSIS — Z95828 Presence of other vascular implants and grafts: Secondary | ICD-10-CM

## 2016-12-13 LAB — CBC WITH DIFFERENTIAL/PLATELET
BASO%: 1.2 % (ref 0.0–2.0)
Basophils Absolute: 0.1 10*3/uL (ref 0.0–0.1)
EOS ABS: 0.3 10*3/uL (ref 0.0–0.5)
EOS%: 5.2 % (ref 0.0–7.0)
HCT: 34.6 % — ABNORMAL LOW (ref 34.8–46.6)
HGB: 11.3 g/dL — ABNORMAL LOW (ref 11.6–15.9)
LYMPH%: 26.5 % (ref 14.0–49.7)
MCH: 30 pg (ref 25.1–34.0)
MCHC: 32.6 g/dL (ref 31.5–36.0)
MCV: 92 fL (ref 79.5–101.0)
MONO#: 0.8 10*3/uL (ref 0.1–0.9)
MONO%: 16.3 % — AB (ref 0.0–14.0)
NEUT#: 2.6 10*3/uL (ref 1.5–6.5)
NEUT%: 50.8 % (ref 38.4–76.8)
PLATELETS: 195 10*3/uL (ref 145–400)
RBC: 3.76 10*6/uL (ref 3.70–5.45)
RDW: 18.3 % — ABNORMAL HIGH (ref 11.2–14.5)
WBC: 5 10*3/uL (ref 3.9–10.3)
lymph#: 1.3 10*3/uL (ref 0.9–3.3)

## 2016-12-13 LAB — COMPREHENSIVE METABOLIC PANEL
ALT: 40 U/L (ref 0–55)
ANION GAP: 9 meq/L (ref 3–11)
AST: 22 U/L (ref 5–34)
Albumin: 3.4 g/dL — ABNORMAL LOW (ref 3.5–5.0)
Alkaline Phosphatase: 98 U/L (ref 40–150)
BUN: 7.8 mg/dL (ref 7.0–26.0)
CHLORIDE: 106 meq/L (ref 98–109)
CO2: 27 meq/L (ref 22–29)
Calcium: 9.6 mg/dL (ref 8.4–10.4)
Creatinine: 0.8 mg/dL (ref 0.6–1.1)
EGFR: 77 mL/min/{1.73_m2} — AB (ref >90)
GLUCOSE: 148 mg/dL — AB (ref 70–140)
POTASSIUM: 4.3 meq/L (ref 3.5–5.1)
SODIUM: 142 meq/L (ref 136–145)
Total Bilirubin: 0.3 mg/dL (ref 0.20–1.20)
Total Protein: 6.6 g/dL (ref 6.4–8.3)

## 2016-12-13 MED ORDER — SODIUM CHLORIDE 0.9 % IV SOLN
Freq: Once | INTRAVENOUS | Status: AC
  Administered 2016-12-13: 11:00:00 via INTRAVENOUS

## 2016-12-13 MED ORDER — SODIUM CHLORIDE 0.9% FLUSH
10.0000 mL | INTRAVENOUS | Status: DC | PRN
  Administered 2016-12-13: 10 mL
  Filled 2016-12-13: qty 10

## 2016-12-13 MED ORDER — PALONOSETRON HCL INJECTION 0.25 MG/5ML
INTRAVENOUS | Status: AC
  Filled 2016-12-13: qty 5

## 2016-12-13 MED ORDER — SODIUM CHLORIDE 0.9% FLUSH
10.0000 mL | INTRAVENOUS | Status: DC | PRN
  Administered 2016-12-13: 10 mL via INTRAVENOUS
  Filled 2016-12-13: qty 10

## 2016-12-13 MED ORDER — DEXAMETHASONE SODIUM PHOSPHATE 10 MG/ML IJ SOLN
INTRAMUSCULAR | Status: AC
  Filled 2016-12-13: qty 1

## 2016-12-13 MED ORDER — GEMCITABINE HCL CHEMO INJECTION 1 GM/26.3ML
2000.0000 mg | Freq: Once | INTRAVENOUS | Status: AC
  Administered 2016-12-13: 2000 mg via INTRAVENOUS
  Filled 2016-12-13: qty 52.6

## 2016-12-13 MED ORDER — PALONOSETRON HCL INJECTION 0.25 MG/5ML
0.2500 mg | Freq: Once | INTRAVENOUS | Status: AC
  Administered 2016-12-13: 0.25 mg via INTRAVENOUS

## 2016-12-13 MED ORDER — HEPARIN SOD (PORK) LOCK FLUSH 100 UNIT/ML IV SOLN
500.0000 [IU] | Freq: Once | INTRAVENOUS | Status: AC | PRN
  Administered 2016-12-13: 500 [IU]
  Filled 2016-12-13: qty 5

## 2016-12-13 MED ORDER — DEXAMETHASONE SODIUM PHOSPHATE 10 MG/ML IJ SOLN
5.0000 mg | Freq: Once | INTRAMUSCULAR | Status: AC
  Administered 2016-12-13: 5 mg via INTRAVENOUS

## 2016-12-13 MED ORDER — PACLITAXEL PROTEIN-BOUND CHEMO INJECTION 100 MG
100.0000 mg/m2 | Freq: Once | INTRAVENOUS | Status: AC
  Administered 2016-12-13: 250 mg via INTRAVENOUS
  Filled 2016-12-13: qty 50

## 2016-12-13 NOTE — Patient Instructions (Signed)
Implanted Port Home Guide An implanted port is a type of central line that is placed under the skin. Central lines are used to provide IV access when treatment or nutrition needs to be given through a person's veins. Implanted ports are used for long-term IV access. An implanted port may be placed because:  You need IV medicine that would be irritating to the small veins in your hands or arms.  You need long-term IV medicines, such as antibiotics.  You need IV nutrition for a long period.  You need frequent blood draws for lab tests.  You need dialysis.  Implanted ports are usually placed in the chest area, but they can also be placed in the upper arm, the abdomen, or the leg. An implanted port has two main parts:  Reservoir. The reservoir is round and will appear as a small, raised area under your skin. The reservoir is the part where a needle is inserted to give medicines or draw blood.  Catheter. The catheter is a thin, flexible tube that extends from the reservoir. The catheter is placed into a large vein. Medicine that is inserted into the reservoir goes into the catheter and then into the vein.  How will I care for my incision site? Do not get the incision site wet. Bathe or shower as directed by your health care provider. How is my port accessed? Special steps must be taken to access the port:  Before the port is accessed, a numbing cream can be placed on the skin. This helps numb the skin over the port site.  Your health care provider uses a sterile technique to access the port. ? Your health care provider must put on a mask and sterile gloves. ? The skin over your port is cleaned carefully with an antiseptic and allowed to dry. ? The port is gently pinched between sterile gloves, and a needle is inserted into the port.  Only "non-coring" port needles should be used to access the port. Once the port is accessed, a blood return should be checked. This helps ensure that the port  is in the vein and is not clogged.  If your port needs to remain accessed for a constant infusion, a clear (transparent) bandage will be placed over the needle site. The bandage and needle will need to be changed every week, or as directed by your health care provider.  Keep the bandage covering the needle clean and dry. Do not get it wet. Follow your health care provider's instructions on how to take a shower or bath while the port is accessed.  If your port does not need to stay accessed, no bandage is needed over the port.  What is flushing? Flushing helps keep the port from getting clogged. Follow your health care provider's instructions on how and when to flush the port. Ports are usually flushed with saline solution or a medicine called heparin. The need for flushing will depend on how the port is used.  If the port is used for intermittent medicines or blood draws, the port will need to be flushed: ? After medicines have been given. ? After blood has been drawn. ? As part of routine maintenance.  If a constant infusion is running, the port may not need to be flushed.  How long will my port stay implanted? The port can stay in for as long as your health care provider thinks it is needed. When it is time for the port to come out, surgery will be   done to remove it. The procedure is similar to the one performed when the port was put in. When should I seek immediate medical care? When you have an implanted port, you should seek immediate medical care if:  You notice a bad smell coming from the incision site.  You have swelling, redness, or drainage at the incision site.  You have more swelling or pain at the port site or the surrounding area.  You have a fever that is not controlled with medicine.  This information is not intended to replace advice given to you by your health care provider. Make sure you discuss any questions you have with your health care provider. Document  Released: 03/13/2005 Document Revised: 08/19/2015 Document Reviewed: 11/18/2012 Elsevier Interactive Patient Education  2017 [employer].  

## 2016-12-13 NOTE — Patient Instructions (Signed)
[facility] Discharge Instructions for Patients Receiving Chemotherapy  Today you received the following chemotherapy agents: Abraxane and Gemzar.  To help prevent nausea and vomiting after your treatment, we encourage you to take your nausea medication: Compazine. Take one every 6 hours as needed. If you develop nausea and vomiting that is not controlled by your nausea medication, call the clinic.   BELOW ARE SYMPTOMS THAT SHOULD BE REPORTED IMMEDIATELY:  *FEVER GREATER THAN 100.5 F  *CHILLS WITH OR WITHOUT FEVER  NAUSEA AND VOMITING THAT IS NOT CONTROLLED WITH YOUR NAUSEA MEDICATION  *UNUSUAL SHORTNESS OF BREATH  *UNUSUAL BRUISING OR BLEEDING  TENDERNESS IN MOUTH AND THROAT WITH OR WITHOUT PRESENCE OF ULCERS  *URINARY PROBLEMS  *BOWEL PROBLEMS  UNUSUAL RASH Items with * indicate a potential emergency and should be followed up as soon as possible.  Feel free to call the clinic should you have any questions or concerns. The clinic phone number is [phone].  Please show the [place] at check-in to the Emergency Department and triage nurse.

## 2016-12-13 NOTE — Telephone Encounter (Signed)
This nurse called central scheduling per Ned Card, NP to schedule a CT for October 8th. Appt was made for 12oclock on 10/8. Pt was made aware and instructions were given. Pt verbalized understanding.

## 2016-12-13 NOTE — Progress Notes (Addendum)
[facility] OFFICE PROGRESS NOTE   Diagnosis:  Pancreas cancer  INTERVAL HISTORY:   Heather Frederick returns as scheduled. She completed cycle 4 gemcitabine/Abraxane 11/23/2016, 11/29/2016. She had vomiting, chills, dizziness and sweating for a few days after the most recent treatment. She denies abdominal pain. No bleeding. She reports progressive shortness of breath or the past month, worse over the past few weeks. No fever or cough. She was seen by cardiology and referred for a chest CT on 12/06/2016. She reports a 2 day heart monitor and echo are planned. She reports the dyspnea is better.  Objective:  Vital signs in last 24 hours:  Blood pressure 122/66, pulse 75, temperature 98.6 F (37 C), temperature source Oral, resp. rate 20, weight 281 lb 9.6 oz (127.7 kg), SpO2 99 %.    HEENT: No thrush or ulcers. Resp: Lungs clear bilaterally. Cardio: Regular rate and rhythm. GI: Abdomen soft and nontender. No hepatosplenomegaly. Vascular: No leg edema. Skin: No rash. Port-A-Cath without erythema.    Lab Results:  Lab Results  Component Value Date   WBC 5.0 12/13/2016   HGB 11.3 (L) 12/13/2016   HCT 34.6 (L) 12/13/2016   MCV 92.0 12/13/2016   PLT 195 12/13/2016   NEUTROABS 2.6 12/13/2016    Imaging:  No results found.  Medications: I have reviewed the patient's current medications.  Assessment/Plan: 1. Metastatic pancreas cancer ? Pancreas body mass and liver metastases noted on CT abdomen/pelvis 08/11/2016 ? Ultrasound-guided biopsy of a right liver lesion 08/16/2016 revealed poorly differentiated adenocarcinoma consistent with pancreas cancer ? Cycle 1 gemcitabine/Abraxane 09/13/2016; 09/29/2016 ? Cycle 2 gemcitabine/Abraxane 10/11/2016, 10/18/2016 ? Cycle 3 gemcitabine/Abraxane 11/01/2016, 11/08/2016 ? Cycle 4 gemcitabine/Abraxane 11/23/2016, 11/29/2016 ? CT chest 12/06/2016-liver lesions appear smaller ? Cycle 5 gemcitabine/Abraxane  12/13/2016  2. Right posterior lateral chest pain-potentially related to a lateral liver metastasis-resolved  3. Hypertension  4. Sleep apnea  5. Chronic low back pain  6. Recurrent urinary tract infections  7. Depression  8. Migraine headaches  9. Port-A-Cath placement 08/28/2016  10. Rash following cycle 1 gemcitabine/Abraxane-drug rash?  11. Nausea/vomiting following cycle 1 gemcitabine/Abraxane-antiemetic regimen adjusted with addition of Aloxi  12. Diabetes  13. Right cephalic vein thrombosis 09/24/1599-UXNATFT with Xarelto  14. CT chest 12/06/2016 done to evaluate dyspnea-several 3-4 mm nodular opacities in the lung parenchyma etiology uncertain. Known mass in the body of the pancreas measuring 3.3 x 2.4 cm; small enhancing lesion in the anterior dome of the liver measures 8 mm.   Disposition: Heather Frederick appears stable. She has completed 4 cycles of gemcitabine/Abraxane on a day one/day 8 schedule of a 21 day cycle. Clinically she is better and the CA-19-9 tumor marker continues to improve. The plan was for her to have restaging CTs of the abdomen/pelvis earlier this week. Unfortunately this was not scheduled. She did have a CT scan of the chest last week which per Dr. Gearldine Shown review shows improvement in the metastatic disease involving the liver. The plan is to continue gemcitabine/Abraxane on the current schedule, cycle 5 day 1 today. We will contact radiology to schedule the abdomen/pelvic CT scan prior to her next visit in 3 weeks.  The etiology of the dyspnea is unclear. We discussed pneumonitis associated with gemcitabine. She does not have a fever or cough and there was no evidence of pneumonitis on the recent chest CT. She will continue follow-up with cardiology.  She will return for a follow-up visit on 01/03/2017. She will contact the office in the interim  with any problems.  Patient seen with Dr. Benay Spice. CT report/images reviewed  with Heather Frederick and her husband. 25 minutes were spent face-to-face at today's visit with the majority of that time involved in counseling/coordination of care.    Ned Card ANP/GNP-BC   12/13/2016  9:25 AM This was a shared visit with Ned Card. We reviewed the chest CT images with Heather Frederick and her husband. It is difficult to make a direct comparison with the pretreatment abdomen CT, but the liver lesions appear improved. Her clinical status and CA 19-9 have also improved with gemcitabine/Abraxane. The plan is to continue the current treatment.  Julieanne Manson, M.D.

## 2016-12-13 NOTE — Telephone Encounter (Signed)
Gave avs and calendar for November and december °

## 2016-12-14 LAB — CANCER ANTIGEN 19-9: CA 19-9: 480 U/mL — ABNORMAL HIGH (ref 0–35)

## 2016-12-15 ENCOUNTER — Ambulatory Visit (HOSPITAL_COMMUNITY): Payer: 59 | Attending: Cardiology

## 2016-12-15 ENCOUNTER — Other Ambulatory Visit: Payer: Self-pay

## 2016-12-15 DIAGNOSIS — R0602 Shortness of breath: Secondary | ICD-10-CM | POA: Insufficient documentation

## 2016-12-15 LAB — ECHOCARDIOGRAM COMPLETE
E decel time: 246 msec
E/e' ratio: 11.81
LA vol A4C: 35 ml
LA vol index: 17.4 mL/m2
LA vol: 39 mL
LV E/e' medial: 11.81
LV E/e'average: 11.81
LV dias vol index: 26 mL/m2
LV dias vol: 58 mL (ref 46–106)
LV e' LATERAL: 7.9 cm/s
LV sys vol index: 9 mL/m2
LV sys vol: 21 mL (ref 14–42)
LVOT VTI: 24.3 cm
LVOT peak grad rest: 5 mmHg
LVOT peak vel: 114 cm/s
Lateral S' vel: 8.11 cm/s
MV Dec: 246
MV Peak grad: 3 mmHg
MV pk A vel: 79 m/s
MV pk E vel: 93.3 m/s
Simpson's disk: 64
Stroke v: 37 ml
TDI e' lateral: 7.9
TDI e' medial: 5.26

## 2016-12-17 ENCOUNTER — Other Ambulatory Visit: Payer: Self-pay | Admitting: Oncology

## 2016-12-18 ENCOUNTER — Other Ambulatory Visit: Payer: Self-pay | Admitting: Nurse Practitioner

## 2016-12-18 ENCOUNTER — Other Ambulatory Visit: Payer: Self-pay | Admitting: *Deleted

## 2016-12-18 NOTE — Telephone Encounter (Signed)
Lyrica refill Rx faxed to [facility].

## 2016-12-19 ENCOUNTER — Other Ambulatory Visit: Payer: Self-pay | Admitting: *Deleted

## 2016-12-19 DIAGNOSIS — C259 Malignant neoplasm of pancreas, unspecified: Secondary | ICD-10-CM

## 2016-12-19 DIAGNOSIS — C787 Secondary malignant neoplasm of liver and intrahepatic bile duct: Principal | ICD-10-CM

## 2016-12-20 ENCOUNTER — Ambulatory Visit (HOSPITAL_BASED_OUTPATIENT_CLINIC_OR_DEPARTMENT_OTHER): Payer: 59

## 2016-12-20 ENCOUNTER — Other Ambulatory Visit (HOSPITAL_BASED_OUTPATIENT_CLINIC_OR_DEPARTMENT_OTHER): Payer: 59

## 2016-12-20 VITALS — BP 137/54 | HR 67 | Temp 98.7°F | Resp 18

## 2016-12-20 DIAGNOSIS — Z5111 Encounter for antineoplastic chemotherapy: Secondary | ICD-10-CM

## 2016-12-20 DIAGNOSIS — C787 Secondary malignant neoplasm of liver and intrahepatic bile duct: Secondary | ICD-10-CM

## 2016-12-20 DIAGNOSIS — C251 Malignant neoplasm of body of pancreas: Secondary | ICD-10-CM | POA: Diagnosis not present

## 2016-12-20 DIAGNOSIS — C259 Malignant neoplasm of pancreas, unspecified: Secondary | ICD-10-CM

## 2016-12-20 LAB — COMPREHENSIVE METABOLIC PANEL
ALT: 58 U/L — AB (ref 0–55)
ANION GAP: 8 meq/L (ref 3–11)
AST: 33 U/L (ref 5–34)
Albumin: 3.3 g/dL — ABNORMAL LOW (ref 3.5–5.0)
Alkaline Phosphatase: 99 U/L (ref 40–150)
BUN: 9.5 mg/dL (ref 7.0–26.0)
CHLORIDE: 106 meq/L (ref 98–109)
CO2: 26 meq/L (ref 22–29)
CREATININE: 0.8 mg/dL (ref 0.6–1.1)
Calcium: 9.3 mg/dL (ref 8.4–10.4)
EGFR: 82 mL/min/{1.73_m2} — ABNORMAL LOW (ref >90)
Glucose: 189 mg/dl — ABNORMAL HIGH (ref 70–140)
POTASSIUM: 4 meq/L (ref 3.5–5.1)
Sodium: 140 mEq/L (ref 136–145)
Total Bilirubin: 0.38 mg/dL (ref 0.20–1.20)
Total Protein: 6.5 g/dL (ref 6.4–8.3)

## 2016-12-20 LAB — CBC WITH DIFFERENTIAL/PLATELET
BASO%: 0.8 % (ref 0.0–2.0)
Basophils Absolute: 0 10*3/uL (ref 0.0–0.1)
EOS%: 2.4 % (ref 0.0–7.0)
Eosinophils Absolute: 0.1 10*3/uL (ref 0.0–0.5)
HEMATOCRIT: 32.2 % — AB (ref 34.8–46.6)
HGB: 10.2 g/dL — ABNORMAL LOW (ref 11.6–15.9)
LYMPH#: 1.5 10*3/uL (ref 0.9–3.3)
LYMPH%: 28.8 % (ref 14.0–49.7)
MCH: 30 pg (ref 25.1–34.0)
MCHC: 31.7 g/dL (ref 31.5–36.0)
MCV: 94.7 fL (ref 79.5–101.0)
MONO#: 0.7 10*3/uL (ref 0.1–0.9)
MONO%: 13.2 % (ref 0.0–14.0)
NEUT%: 54.8 % (ref 38.4–76.8)
NEUTROS ABS: 2.8 10*3/uL (ref 1.5–6.5)
PLATELETS: 170 10*3/uL (ref 145–400)
RBC: 3.4 10*6/uL — ABNORMAL LOW (ref 3.70–5.45)
RDW: 16.7 % — AB (ref 11.2–14.5)
WBC: 5.1 10*3/uL (ref 3.9–10.3)

## 2016-12-20 LAB — TECHNOLOGIST REVIEW

## 2016-12-20 MED ORDER — SODIUM CHLORIDE 0.9 % IV SOLN
Freq: Once | INTRAVENOUS | Status: AC
  Administered 2016-12-20: 11:00:00 via INTRAVENOUS

## 2016-12-20 MED ORDER — PACLITAXEL PROTEIN-BOUND CHEMO INJECTION 100 MG
100.0000 mg/m2 | Freq: Once | INTRAVENOUS | Status: AC
  Administered 2016-12-20: 250 mg via INTRAVENOUS
  Filled 2016-12-20: qty 50

## 2016-12-20 MED ORDER — DEXAMETHASONE SODIUM PHOSPHATE 10 MG/ML IJ SOLN
5.0000 mg | Freq: Once | INTRAMUSCULAR | Status: AC
  Administered 2016-12-20: 5 mg via INTRAVENOUS

## 2016-12-20 MED ORDER — PALONOSETRON HCL INJECTION 0.25 MG/5ML
0.2500 mg | Freq: Once | INTRAVENOUS | Status: AC
  Administered 2016-12-20: 0.25 mg via INTRAVENOUS

## 2016-12-20 MED ORDER — PALONOSETRON HCL INJECTION 0.25 MG/5ML
INTRAVENOUS | Status: AC
  Filled 2016-12-20: qty 5

## 2016-12-20 MED ORDER — HEPARIN SOD (PORK) LOCK FLUSH 100 UNIT/ML IV SOLN
500.0000 [IU] | Freq: Once | INTRAVENOUS | Status: AC | PRN
  Administered 2016-12-20: 500 [IU]
  Filled 2016-12-20: qty 5

## 2016-12-20 MED ORDER — SODIUM CHLORIDE 0.9% FLUSH
10.0000 mL | INTRAVENOUS | Status: DC | PRN
  Administered 2016-12-20: 10 mL
  Filled 2016-12-20: qty 10

## 2016-12-20 MED ORDER — SODIUM CHLORIDE 0.9 % IV SOLN
2000.0000 mg | Freq: Once | INTRAVENOUS | Status: AC
  Administered 2016-12-20: 2000 mg via INTRAVENOUS
  Filled 2016-12-20: qty 52.6

## 2016-12-20 MED ORDER — DEXAMETHASONE SODIUM PHOSPHATE 10 MG/ML IJ SOLN
INTRAMUSCULAR | Status: AC
  Filled 2016-12-20: qty 1

## 2016-12-20 NOTE — Patient Instructions (Signed)
[facility] Discharge Instructions for Patients Receiving Chemotherapy  Today you received the following chemotherapy agents: Abraxane and Gemzar   To help prevent nausea and vomiting after your treatment, we encourage you to take your nausea medication as directed.    If you develop nausea and vomiting that is not controlled by your nausea medication, call the clinic.   BELOW ARE SYMPTOMS THAT SHOULD BE REPORTED IMMEDIATELY:  *FEVER GREATER THAN 100.5 F  *CHILLS WITH OR WITHOUT FEVER  NAUSEA AND VOMITING THAT IS NOT CONTROLLED WITH YOUR NAUSEA MEDICATION  *UNUSUAL SHORTNESS OF BREATH  *UNUSUAL BRUISING OR BLEEDING  TENDERNESS IN MOUTH AND THROAT WITH OR WITHOUT PRESENCE OF ULCERS  *URINARY PROBLEMS  *BOWEL PROBLEMS  UNUSUAL RASH Items with * indicate a potential emergency and should be followed up as soon as possible.  Feel free to call the clinic should you have any questions or concerns. The clinic phone number is [phone].  Please show the CHEMO ALERT CARD at check-in to the Emergency Department and triage nurse.   

## 2016-12-29 ENCOUNTER — Other Ambulatory Visit: Payer: Self-pay | Admitting: Oncology

## 2016-12-29 ENCOUNTER — Other Ambulatory Visit: Payer: Self-pay | Admitting: Nurse Practitioner

## 2016-12-29 ENCOUNTER — Ambulatory Visit (INDEPENDENT_AMBULATORY_CARE_PROVIDER_SITE_OTHER): Payer: 59

## 2016-12-29 DIAGNOSIS — R079 Chest pain, unspecified: Secondary | ICD-10-CM

## 2016-12-29 DIAGNOSIS — R002 Palpitations: Secondary | ICD-10-CM | POA: Diagnosis not present

## 2016-12-29 DIAGNOSIS — R0602 Shortness of breath: Secondary | ICD-10-CM | POA: Diagnosis not present

## 2017-01-01 ENCOUNTER — Encounter (HOSPITAL_COMMUNITY): Payer: Self-pay

## 2017-01-01 ENCOUNTER — Ambulatory Visit (HOSPITAL_COMMUNITY)
Admission: RE | Admit: 2017-01-01 | Discharge: 2017-01-01 | Disposition: A | Discharge status: Home / Self Care | Payer: 59 | Source: Ambulatory Visit | Attending: Oncology | Admitting: Oncology

## 2017-01-01 DIAGNOSIS — C787 Secondary malignant neoplasm of liver and intrahepatic bile duct: Secondary | ICD-10-CM | POA: Diagnosis present

## 2017-01-01 DIAGNOSIS — C259 Malignant neoplasm of pancreas, unspecified: Secondary | ICD-10-CM | POA: Diagnosis not present

## 2017-01-01 MED ORDER — IOPAMIDOL (ISOVUE-300) INJECTION 61%
INTRAVENOUS | Status: AC
  Administered 2017-01-01: 100 mL via INTRAVENOUS
  Filled 2017-01-01: qty 100

## 2017-01-01 MED ORDER — IOPAMIDOL (ISOVUE-300) INJECTION 61%
100.0000 mL | Freq: Once | INTRAVENOUS | Status: AC | PRN
  Administered 2017-01-01: 100 mL via INTRAVENOUS

## 2017-01-02 ENCOUNTER — Other Ambulatory Visit: Payer: Self-pay | Admitting: *Deleted

## 2017-01-02 DIAGNOSIS — C259 Malignant neoplasm of pancreas, unspecified: Secondary | ICD-10-CM

## 2017-01-02 DIAGNOSIS — C787 Secondary malignant neoplasm of liver and intrahepatic bile duct: Principal | ICD-10-CM

## 2017-01-03 ENCOUNTER — Ambulatory Visit (HOSPITAL_BASED_OUTPATIENT_CLINIC_OR_DEPARTMENT_OTHER): Payer: 59 | Admitting: Nurse Practitioner

## 2017-01-03 ENCOUNTER — Ambulatory Visit: Payer: 59

## 2017-01-03 ENCOUNTER — Other Ambulatory Visit (HOSPITAL_BASED_OUTPATIENT_CLINIC_OR_DEPARTMENT_OTHER): Payer: 59

## 2017-01-03 ENCOUNTER — Ambulatory Visit (HOSPITAL_BASED_OUTPATIENT_CLINIC_OR_DEPARTMENT_OTHER): Payer: 59

## 2017-01-03 VITALS — BP 107/53 | HR 72 | Temp 98.3°F | Resp 18 | Ht 63.0 in | Wt 280.3 lb

## 2017-01-03 DIAGNOSIS — C787 Secondary malignant neoplasm of liver and intrahepatic bile duct: Principal | ICD-10-CM

## 2017-01-03 DIAGNOSIS — C251 Malignant neoplasm of body of pancreas: Secondary | ICD-10-CM | POA: Diagnosis not present

## 2017-01-03 DIAGNOSIS — I10 Essential (primary) hypertension: Secondary | ICD-10-CM | POA: Diagnosis not present

## 2017-01-03 DIAGNOSIS — C259 Malignant neoplasm of pancreas, unspecified: Secondary | ICD-10-CM

## 2017-01-03 DIAGNOSIS — Z5111 Encounter for antineoplastic chemotherapy: Secondary | ICD-10-CM | POA: Diagnosis not present

## 2017-01-03 LAB — CBC & DIFF AND RETIC
BASO%: 0.8 % (ref 0.0–2.0)
Basophils Absolute: 0 10*3/uL (ref 0.0–0.1)
EOS ABS: 0.2 10*3/uL (ref 0.0–0.5)
EOS%: 4.5 % (ref 0.0–7.0)
HCT: 34.7 % — ABNORMAL LOW (ref 34.8–46.6)
HEMOGLOBIN: 11.1 g/dL — AB (ref 11.6–15.9)
IMMATURE RETIC FRACT: 11.7 % — AB (ref 1.60–10.00)
LYMPH#: 1.2 10*3/uL (ref 0.9–3.3)
LYMPH%: 22 % (ref 14.0–49.7)
MCH: 30.2 pg (ref 25.1–34.0)
MCHC: 32 g/dL (ref 31.5–36.0)
MCV: 94.6 fL (ref 79.5–101.0)
MONO#: 0.9 10*3/uL (ref 0.1–0.9)
MONO%: 17.1 % — ABNORMAL HIGH (ref 0.0–14.0)
NEUT%: 55.6 % (ref 38.4–76.8)
NEUTROS ABS: 3 10*3/uL (ref 1.5–6.5)
Platelets: 140 10*3/uL — ABNORMAL LOW (ref 145–400)
RBC: 3.67 10*6/uL — AB (ref 3.70–5.45)
RDW: 16.3 % — AB (ref 11.2–14.5)
RETIC %: 2.23 % — AB (ref 0.70–2.10)
Retic Ct Abs: 81.84 10*3/uL (ref 33.70–90.70)
WBC: 5.3 10*3/uL (ref 3.9–10.3)

## 2017-01-03 LAB — COMPREHENSIVE METABOLIC PANEL
ALBUMIN: 3.5 g/dL (ref 3.5–5.0)
ALK PHOS: 102 U/L (ref 40–150)
ALT: 31 U/L (ref 0–55)
AST: 21 U/L (ref 5–34)
Anion Gap: 9 mEq/L (ref 3–11)
BILIRUBIN TOTAL: 0.55 mg/dL (ref 0.20–1.20)
BUN: 10.6 mg/dL (ref 7.0–26.0)
CO2: 26 meq/L (ref 22–29)
Calcium: 9.5 mg/dL (ref 8.4–10.4)
Chloride: 104 mEq/L (ref 98–109)
Creatinine: 0.9 mg/dL (ref 0.6–1.1)
EGFR: >60 mL/min/{1.73_m2} (ref >60)
GLUCOSE: 195 mg/dL — AB (ref 70–140)
POTASSIUM: 4.3 meq/L (ref 3.5–5.1)
SODIUM: 139 meq/L (ref 136–145)
TOTAL PROTEIN: 6.6 g/dL (ref 6.4–8.3)

## 2017-01-03 MED ORDER — SODIUM CHLORIDE 0.9 % IV SOLN
Freq: Once | INTRAVENOUS | Status: AC
  Administered 2017-01-03: 11:00:00 via INTRAVENOUS

## 2017-01-03 MED ORDER — DEXAMETHASONE SODIUM PHOSPHATE 10 MG/ML IJ SOLN
INTRAMUSCULAR | Status: AC
  Filled 2017-01-03: qty 1

## 2017-01-03 MED ORDER — SODIUM CHLORIDE 0.9% FLUSH
10.0000 mL | INTRAVENOUS | Status: DC | PRN
  Administered 2017-01-03: 10 mL
  Filled 2017-01-03: qty 10

## 2017-01-03 MED ORDER — PALONOSETRON HCL INJECTION 0.25 MG/5ML
INTRAVENOUS | Status: AC
  Filled 2017-01-03: qty 5

## 2017-01-03 MED ORDER — LORAZEPAM 0.5 MG PO TABS: 0.5000 mg | ORAL_TABLET | Freq: Three times a day (TID) | ORAL | 0 refills | Status: DC | PRN

## 2017-01-03 MED ORDER — PACLITAXEL PROTEIN-BOUND CHEMO INJECTION 100 MG
100.0000 mg/m2 | Freq: Once | INTRAVENOUS | Status: AC
  Administered 2017-01-03: 250 mg via INTRAVENOUS
  Filled 2017-01-03: qty 50

## 2017-01-03 MED ORDER — SODIUM CHLORIDE 0.9 % IV SOLN
2000.0000 mg | Freq: Once | INTRAVENOUS | Status: AC
  Administered 2017-01-03: 2000 mg via INTRAVENOUS
  Filled 2017-01-03: qty 52.6

## 2017-01-03 MED ORDER — HEPARIN SOD (PORK) LOCK FLUSH 100 UNIT/ML IV SOLN
500.0000 [IU] | Freq: Once | INTRAVENOUS | Status: AC | PRN
  Administered 2017-01-03: 500 [IU]
  Filled 2017-01-03: qty 5

## 2017-01-03 MED ORDER — PALONOSETRON HCL INJECTION 0.25 MG/5ML
0.2500 mg | Freq: Once | INTRAVENOUS | Status: AC
  Administered 2017-01-03: 0.25 mg via INTRAVENOUS

## 2017-01-03 MED ORDER — DEXAMETHASONE SODIUM PHOSPHATE 10 MG/ML IJ SOLN
5.0000 mg | Freq: Once | INTRAMUSCULAR | Status: AC
  Administered 2017-01-03: 5 mg via INTRAVENOUS

## 2017-01-03 NOTE — Progress Notes (Addendum)
[facility] OFFICE PROGRESS NOTE   Diagnosis:  Pancreas cancer  INTERVAL HISTORY:   Heather Frederick as scheduled. She completed cycle 5 day 8 gemcitabine/Abraxane 12/20/2016. He had mild nausea for a few days after the treatment. No mouth sores. No diarrhea. No fever. She noted a red rash at the right forearm after the day 8 treatment. The rash has faded. She has stable numbness in the first 3 toes on each foot. No numbness in the hands. No abdominal pain.  Objective:  Vital signs in last 24 hours:  Blood pressure (!) 107/53, pulse 72, temperature 98.3 F (36.8 C), temperature source Oral, resp. rate 18, height 5\' 3"  (1.6 m), weight 280 lb 4.8 oz (127.1 kg), SpO2 98 %.    HEENT: No thrush or ulcers. Resp: Lungs clear bilaterally. Cardio: Regular rate and rhythm. GI: Abdomen soft and nontender. No hepatomegaly. Vascular: No leg edema. Skin: Very faint erythematous resolving rash at the right forearm. Port-A-Cath without erythema.    Lab Results:  Lab Results  Component Value Date   WBC 5.3 01/03/2017   HGB 11.1 (L) 01/03/2017   HCT 34.7 (L) 01/03/2017   MCV 94.6 01/03/2017   PLT 140 (L) 01/03/2017   NEUTROABS 3.0 01/03/2017    Imaging:  Ct Abdomen Pelvis W Contrast  Result Date: 01/01/2017 CLINICAL DATA:  Stage IV pancreatic cancer with metastatic disease to the liver, diagnosed in May 2018. Ongoing oral chemotherapy. EXAM: CT ABDOMEN AND PELVIS WITH CONTRAST TECHNIQUE: Multidetector CT imaging of the abdomen and pelvis was performed using the standard protocol following bolus administration of intravenous contrast. CONTRAST:  100 cc Isovue 300 COMPARISON:  08/11/2016 FINDINGS: Lower chest: Mild bandlike scarring anteriorly in the right lower lobe. Hepatobiliary: Approximately 9 hepatic masses are present, some which are new compared to the prior exam. Index lesion in the right hepatic lobe measures 3.3 by 2.3 cm on image 33/3, formerly measuring 1.8 by 1.5 cm.  Gallbladder surgically absent. No biliary dilatation. Pancreas: Infiltrative hypodense mass in the pancreatic body measures approximately 4.9 by 2.8 cm, formerly 3.9 by 2.8 cm. Atrophy in the pancreatic tail with dorsal pancreatic duct dilatation. Increased abutment of the splenic vein by the mass the mass abuts the splenic artery proximally. There is some increased peripancreatic stranding in the vicinity of the mass. Spleen: Upper normal splenic size. Adrenals/Urinary Tract: Unremarkable Stomach/Bowel: Periampullary duodenal diverticulum. Slight wall thickening in the cecum near the opening to the appendix is probably incidental and was not present previously. Vascular/Lymphatic: As noted above, the mass abuts the splenic artery and splenic vein, with indistinct tissue margins around the splenic artery in the vicinity of the mass. No pathologic adenopathy. Reproductive: Uterus absent.  Adnexa unremarkable. Other: No supplemental non-categorized findings. Musculoskeletal: Postoperative findings from prior midline hernia mesh. Lower lumbar degenerative facet arthropathy. IMPRESSION: 1. New and enlarging hepatic masses. Enlarging pancreatic mass, abutting the splenic artery and vein. Appearance compatible with progressive pancreatic cancer. Electronically Signed   By: Van Clines M.D.   On: 01/01/2017 14:10    Medications: I have reviewed the patient's current medications.  Assessment/Plan: 1. Metastatic pancreas cancer ? Pancreas body mass and liver metastases noted on CT abdomen/pelvis 08/11/2016 ? Ultrasound-guided biopsy of a right liver lesion 08/16/2016 revealed poorly differentiated adenocarcinoma consistent with pancreas cancer ? Cycle 1 gemcitabine/Abraxane 09/13/2016; 09/29/2016 ? Cycle 2 gemcitabine/Abraxane 10/11/2016, 10/18/2016 ? Cycle 3 gemcitabine/Abraxane 11/01/2016, 11/08/2016 ? Cycle 4 gemcitabine/Abraxane 11/23/2016, 11/29/2016 ? CT chest 12/06/2016-liver lesions appear  smaller ? Cycle  5 gemcitabine/Abraxane 12/13/2016 ? CT abdomen/pelvis 01/01/2017-new and enlarging hepatic masses. Enlarging pancreatic mass. ? Cycle 6 gemcitabine/Abraxane 01/03/2017  2. Right posterior lateral chest pain-potentially related to a lateral liver metastasis-resolved  3. Hypertension  4. Sleep apnea  5. Chronic low back pain  6. Recurrent urinary tract infections  7. Depression  8. Migraine headaches  9. Port-A-Cath placement 08/28/2016  10. Rash following cycle 1 gemcitabine/Abraxane-drug rash?  11. Nausea/vomiting following cycle 1 gemcitabine/Abraxane-antiemetic regimen adjusted with addition of Aloxi  12. Diabetes  13. Right cephalic vein thrombosis 37/06/8887-[id number] with Xarelto  14. CT chest 12/06/2016 done to evaluate dyspnea-several 3-4 mm nodular opacities in the lung parenchyma etiology uncertain. Known mass in the body of the pancreas measuring 3.3 x 2.4 cm; small enhancing lesion in the anterior dome of the liver measures 8 mm.  15. Right forearm rash following cycle 5 day 8 gemcitabine/Abraxane. Resolving.  Disposition: Heather Frederick appears stable. She has completed 5 cycles of gemcitabine/Abraxane. She has improved significantly clinically and the CA-19-9 tumor marker has improved on chemotherapy. The recent restaging CT evaluation shows new and enlarging liver masses. Dr. Benay Spice reviewed the CT report/images with Ms. Pauli and her husband. There was a significant gap between the initial CT scan and initiation of chemotherapy. Dr. Benay Spice will review the scan with the radiologist. The plan for now is to continue gemcitabine/Abraxane on the current schedule with plans for the next restaging evaluation at a 2 to three-month interval. She is comfortable with this plan.  The rash at the right forearm following cycle 5 day 8 is resolving. We discussed pseudo-cellulitis associated with gemcitabine. If the rash recurs she will  contact the office.  She has developed numbness in multiple toes. She understands this is likely related to Abraxane. We will continue to follow.  She will return for a follow-up visit and chemotherapy in 3 weeks. She will contact the office in the interim with any problems.  Patient seen with Dr. Benay Spice. 25 minutes were spent face-to-face at today's visit with the majority of that time involved in counseling/coordination of care.  Ned Card ANP/GNP-BC   01/03/2017  10:02 AM  This was a shared visit with Ned Card. We reviewed the CT images with Ms. Gomm. It is difficult compare the images directly secondary to apparent differences in contrast timing. I will review the images in radiology.  There was a significant delay between the baseline CT and onset of consistent treatment. The overall pattern is of clinical improvement with resolution of abdominal pain and a decreased CA 19-9. We decided to continue gemcitabine/Abraxane.  Julieanne Manson, M.D.  I reviewed the 01/01/2017 CT with a radiologist. The pancreas mass appears unchanged. There is a mixed response in the liver with the overall impression being slight progression. The dominant lesion in the right liver has increased in size. Other lesions are slightly larger, though a few of the lesions appear smaller.  Julieanne Manson, M.D.

## 2017-01-03 NOTE — Patient Instructions (Signed)
[facility] Discharge Instructions for Patients Receiving Chemotherapy  Today you received the following chemotherapy agents: Abraxane and Gemzar   To help prevent nausea and vomiting after your treatment, we encourage you to take your nausea medication as directed.    If you develop nausea and vomiting that is not controlled by your nausea medication, call the clinic.   BELOW ARE SYMPTOMS THAT SHOULD BE REPORTED IMMEDIATELY:  *FEVER GREATER THAN 100.5 F  *CHILLS WITH OR WITHOUT FEVER  NAUSEA AND VOMITING THAT IS NOT CONTROLLED WITH YOUR NAUSEA MEDICATION  *UNUSUAL SHORTNESS OF BREATH  *UNUSUAL BRUISING OR BLEEDING  TENDERNESS IN MOUTH AND THROAT WITH OR WITHOUT PRESENCE OF ULCERS  *URINARY PROBLEMS  *BOWEL PROBLEMS  UNUSUAL RASH Items with * indicate a potential emergency and should be followed up as soon as possible.  Feel free to call the clinic should you have any questions or concerns. The clinic phone number is [phone].  Please show the CHEMO ALERT CARD at check-in to the Emergency Department and triage nurse.   

## 2017-01-04 LAB — CANCER ANTIGEN 19-9: CAN 19-9: 842 U/mL — AB (ref 0–35)

## 2017-01-05 ENCOUNTER — Other Ambulatory Visit: Payer: Self-pay | Admitting: Nurse Practitioner

## 2017-01-05 DIAGNOSIS — C787 Secondary malignant neoplasm of liver and intrahepatic bile duct: Principal | ICD-10-CM

## 2017-01-05 DIAGNOSIS — C259 Malignant neoplasm of pancreas, unspecified: Secondary | ICD-10-CM

## 2017-01-07 ENCOUNTER — Other Ambulatory Visit: Payer: Self-pay | Admitting: Oncology

## 2017-01-10 ENCOUNTER — Other Ambulatory Visit: Payer: Self-pay | Admitting: Oncology

## 2017-01-10 ENCOUNTER — Other Ambulatory Visit (HOSPITAL_BASED_OUTPATIENT_CLINIC_OR_DEPARTMENT_OTHER): Payer: 59

## 2017-01-10 ENCOUNTER — Ambulatory Visit (HOSPITAL_BASED_OUTPATIENT_CLINIC_OR_DEPARTMENT_OTHER): Payer: 59

## 2017-01-10 ENCOUNTER — Ambulatory Visit: Payer: 59

## 2017-01-10 VITALS — BP 106/55 | HR 76 | Temp 98.7°F | Resp 18

## 2017-01-10 DIAGNOSIS — Z5111 Encounter for antineoplastic chemotherapy: Secondary | ICD-10-CM | POA: Diagnosis not present

## 2017-01-10 DIAGNOSIS — C251 Malignant neoplasm of body of pancreas: Secondary | ICD-10-CM

## 2017-01-10 DIAGNOSIS — C259 Malignant neoplasm of pancreas, unspecified: Secondary | ICD-10-CM

## 2017-01-10 DIAGNOSIS — C787 Secondary malignant neoplasm of liver and intrahepatic bile duct: Secondary | ICD-10-CM | POA: Diagnosis not present

## 2017-01-10 DIAGNOSIS — Z95828 Presence of other vascular implants and grafts: Secondary | ICD-10-CM

## 2017-01-10 LAB — CBC WITH DIFFERENTIAL/PLATELET
BASO%: 0.8 % (ref 0.0–2.0)
BASOS ABS: 0.1 10*3/uL (ref 0.0–0.1)
EOS%: 2.4 % (ref 0.0–7.0)
Eosinophils Absolute: 0.2 10*3/uL (ref 0.0–0.5)
HCT: 33.3 % — ABNORMAL LOW (ref 34.8–46.6)
HEMOGLOBIN: 10.7 g/dL — AB (ref 11.6–15.9)
LYMPH#: 1.3 10*3/uL (ref 0.9–3.3)
LYMPH%: 21.1 % (ref 14.0–49.7)
MCH: 30.2 pg (ref 25.1–34.0)
MCHC: 32.1 g/dL (ref 31.5–36.0)
MCV: 94.1 fL (ref 79.5–101.0)
MONO#: 1.1 10*3/uL — AB (ref 0.1–0.9)
MONO%: 17 % — ABNORMAL HIGH (ref 0.0–14.0)
NEUT#: 3.7 10*3/uL (ref 1.5–6.5)
NEUT%: 58.7 % (ref 38.4–76.8)
PLATELETS: 162 10*3/uL (ref 145–400)
RBC: 3.54 10*6/uL — ABNORMAL LOW (ref 3.70–5.45)
RDW: 15.4 % — ABNORMAL HIGH (ref 11.2–14.5)
WBC: 6.4 10*3/uL (ref 3.9–10.3)
nRBC: 1 % — ABNORMAL HIGH (ref 0–0)

## 2017-01-10 LAB — COMPREHENSIVE METABOLIC PANEL
ALBUMIN: 3.3 g/dL — AB (ref 3.5–5.0)
ALK PHOS: 99 U/L (ref 40–150)
ALT: 38 U/L (ref 0–55)
AST: 22 U/L (ref 5–34)
Anion Gap: 9 mEq/L (ref 3–11)
BUN: 11.7 mg/dL (ref 7.0–26.0)
CALCIUM: 9.4 mg/dL (ref 8.4–10.4)
CO2: 26 mEq/L (ref 22–29)
Chloride: 103 mEq/L (ref 98–109)
Creatinine: 0.9 mg/dL (ref 0.6–1.1)
Glucose: 227 mg/dl — ABNORMAL HIGH (ref 70–140)
POTASSIUM: 4.4 meq/L (ref 3.5–5.1)
Sodium: 138 mEq/L (ref 136–145)
Total Bilirubin: 0.34 mg/dL (ref 0.20–1.20)
Total Protein: 6.6 g/dL (ref 6.4–8.3)

## 2017-01-10 LAB — TECHNOLOGIST REVIEW

## 2017-01-10 MED ORDER — PALONOSETRON HCL INJECTION 0.25 MG/5ML
INTRAVENOUS | Status: AC
  Filled 2017-01-10: qty 5

## 2017-01-10 MED ORDER — SODIUM CHLORIDE 0.9 % IV SOLN
Freq: Once | INTRAVENOUS | Status: AC
  Administered 2017-01-10: 10:00:00 via INTRAVENOUS

## 2017-01-10 MED ORDER — SODIUM CHLORIDE 0.9% FLUSH
10.0000 mL | INTRAVENOUS | Status: DC | PRN
  Administered 2017-01-10: 10 mL
  Filled 2017-01-10: qty 10

## 2017-01-10 MED ORDER — SODIUM CHLORIDE 0.9 % IV SOLN
2000.0000 mg | Freq: Once | INTRAVENOUS | Status: AC
  Administered 2017-01-10: 2000 mg via INTRAVENOUS
  Filled 2017-01-10: qty 52.6

## 2017-01-10 MED ORDER — DEXAMETHASONE SODIUM PHOSPHATE 10 MG/ML IJ SOLN
INTRAMUSCULAR | Status: AC
Start: 2017-01-10 — End: 2017-01-10
  Filled 2017-01-10: qty 1

## 2017-01-10 MED ORDER — PALONOSETRON HCL INJECTION 0.25 MG/5ML
0.2500 mg | Freq: Once | INTRAVENOUS | Status: AC
  Administered 2017-01-10: 0.25 mg via INTRAVENOUS

## 2017-01-10 MED ORDER — DEXAMETHASONE SODIUM PHOSPHATE 10 MG/ML IJ SOLN
5.0000 mg | Freq: Once | INTRAMUSCULAR | Status: AC
  Administered 2017-01-10: 5 mg via INTRAVENOUS

## 2017-01-10 MED ORDER — HEPARIN SOD (PORK) LOCK FLUSH 100 UNIT/ML IV SOLN
500.0000 [IU] | Freq: Once | INTRAVENOUS | Status: AC | PRN
  Administered 2017-01-10: 500 [IU]
  Filled 2017-01-10: qty 5

## 2017-01-10 MED ORDER — SODIUM CHLORIDE 0.9% FLUSH
10.0000 mL | INTRAVENOUS | Status: DC | PRN
  Administered 2017-01-10: 10 mL via INTRAVENOUS
  Filled 2017-01-10: qty 10

## 2017-01-10 NOTE — Progress Notes (Signed)
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 Started "a few days ago". Heather Frederick desk RN for Dr Benay Spice notified. Per Dr Benay Spice no Abraxane today.   Pt also expressed some pain (as charted in assessment), R side, rated 2 out of 10, positioning alleviates & pt took Tylenol this am "which helped", Tanya desk RN Dr Benay Spice aware.

## 2017-01-10 NOTE — Patient Instructions (Addendum)
[facility] °Discharge Instructions for Patients Receiving Chemotherapy ° °Today you received the following chemotherapy agents Gemzar ° °To help prevent nausea and vomiting after your treatment, we encourage you to take your nausea medication as directed. °  °If you develop nausea and vomiting that is not controlled by your nausea medication, call the clinic.  ° °BELOW ARE SYMPTOMS THAT SHOULD BE REPORTED IMMEDIATELY: °· *FEVER GREATER THAN 100.5 F °· *CHILLS WITH OR WITHOUT FEVER °· NAUSEA AND VOMITING THAT IS NOT CONTROLLED WITH YOUR NAUSEA MEDICATION °· *UNUSUAL SHORTNESS OF BREATH °· *UNUSUAL BRUISING OR BLEEDING °· TENDERNESS IN MOUTH AND THROAT WITH OR WITHOUT PRESENCE OF ULCERS °· *URINARY PROBLEMS °· *BOWEL PROBLEMS °· UNUSUAL RASH °Items with * indicate a potential emergency and should be followed up as soon as possible. ° °Feel free to call the clinic should you have any questions or concerns. The clinic phone number is [phone]. ° °Please show the CHEMO ALERT CARD at check-in to the Emergency Department and triage nurse. ° ° °

## 2017-01-11 LAB — CANCER ANTIGEN 19-9: CA 19-9: 1003 U/mL — ABNORMAL HIGH (ref 0–35)

## 2017-01-15 ENCOUNTER — Telehealth: Payer: Self-pay | Admitting: *Deleted

## 2017-01-15 DIAGNOSIS — C787 Secondary malignant neoplasm of liver and intrahepatic bile duct: Principal | ICD-10-CM

## 2017-01-15 DIAGNOSIS — C259 Malignant neoplasm of pancreas, unspecified: Secondary | ICD-10-CM

## 2017-01-15 NOTE — Telephone Encounter (Signed)
Called pt with MD instructions per note below. She voiced understanding. Appt given for 10/29 @ 11 for lab. She understands schedulers will call if that time is unavailable.

## 2017-01-15 NOTE — Addendum Note (Signed)
Addended by: Brien Few on: 01/15/2017 05:56 PM   Modules accepted: Orders

## 2017-01-15 NOTE — Telephone Encounter (Signed)
Call for increased pain, check ca19-9 1-2 days prior to next visit

## 2017-01-15 NOTE — Telephone Encounter (Signed)
Pt called to report tenderness in mid abdomen.  No N/V, eating ok, BM's normal, pain scale #2 with tenderness to touch.  Next appt 01/24/17.

## 2017-01-16 ENCOUNTER — Ambulatory Visit: Payer: 59 | Admitting: Interventional Cardiology

## 2017-01-19 ENCOUNTER — Encounter: Payer: Self-pay | Admitting: Interventional Cardiology

## 2017-01-19 ENCOUNTER — Ambulatory Visit (INDEPENDENT_AMBULATORY_CARE_PROVIDER_SITE_OTHER): Payer: 59 | Admitting: Interventional Cardiology

## 2017-01-19 ENCOUNTER — Other Ambulatory Visit: Payer: Self-pay | Admitting: Internal Medicine

## 2017-01-19 VITALS — BP 122/66 | HR 103 | Ht 63.0 in | Wt 282.8 lb

## 2017-01-19 DIAGNOSIS — I5030 Unspecified diastolic (congestive) heart failure: Secondary | ICD-10-CM | POA: Diagnosis not present

## 2017-01-19 DIAGNOSIS — E785 Hyperlipidemia, unspecified: Secondary | ICD-10-CM

## 2017-01-19 DIAGNOSIS — G4733 Obstructive sleep apnea (adult) (pediatric): Secondary | ICD-10-CM | POA: Diagnosis not present

## 2017-01-19 DIAGNOSIS — I10 Essential (primary) hypertension: Secondary | ICD-10-CM | POA: Diagnosis not present

## 2017-01-19 NOTE — Patient Instructions (Signed)
Medication Instructions:  Your physician recommends that you continue on your current medications as directed. Please refer to the Current Medication list given to you today.  Labwork: None  Testing/Procedures: None  Follow-Up: Your physician recommends that you schedule a follow-up appointment as needed with Dr. Smith.     Any Other Special Instructions Will Be Listed Below (If Applicable).     If you need a refill on your cardiac medications before your next appointment, please call your pharmacy.   

## 2017-01-19 NOTE — Progress Notes (Signed)
Cardiology Office Note    Date:  01/19/2017   ID:  Heather, Frederick Dec 01, 1955, MRN [id number]  PCP:  [name], MD  Cardiologist: Sinclair Grooms, MD   Chief Complaint  Patient presents with  . Congestive Heart Failure    History of Present Illness:  Heather Frederick is a 61 y.o. female palpitations, follow-up of obesity, diastolic heart failure, obstructive sleep apnea, and noncardiac chest pain.   Since I last saw Heather Frederick she has been diagnosed with stage IV adenocarcinoma of the pancreas. She is on chemotherapy, gemcitabine/Abraxane , and having a very difficult time.  She was seen here in September because of palpitations and dyspnea. A monitor demonstrated PACs and PVCs. No atrial fibrillation or worse some arrhythmias were noted.    Past Medical History:  Diagnosis Date  . Allergy   . Anxiety   . Arthritis   . Benign essential HTN 09/23/2014  . Cancer ([place])   . Chronic kidney disease    uti  . Depression   . Dyslipidemia   . Elevated liver enzymes   . Family history of anesthesia complication    father has a severe hard time waking up  . Gallstones    a. Seen on CT 01/2014.  Marland Kitchen GERD (gastroesophageal reflux disease)   . Hard of hearing   . Hepatic steatosis   . History of frequent urinary tract infections   . Hyperlipidemia   . Hypertension   . Lateral epicondylitis of right elbow   . Mental disorder   . Meralgia paresthetica of right side 10/02/2012   slight at 05/2014  . Migraine headache   . Obesity   . OSA (obstructive sleep apnea)    severe with AHI 37/hr now on CPAP at 18cm H2O  . Osteoarthritis   . Pneumonia    Feb 2018  . Raynaud disease    in feet per patient   . Sleep apnea    wears c-pap  . Urinary tract infection     Past Surgical History:  Procedure Laterality Date  . ABDOMINAL HYSTERECTOMY  1/04   partial  . BLADDER SUSPENSION  6/10  . CARDIAC CATHETERIZATION    . carpel tunnel left/right  7/08, 8/08 Bilateral 8/08 and 7/08    . carpel tunnel rel Right 4/12  . CHOLECYSTECTOMY N/A 06/11/2014   Procedure: LAPAROSCOPIC CHOLECYSTECTOMY WITH ATTEMPTED INTRAOPERATIVE CHOLANGIOGRAM;  Surgeon: Jackolyn Confer, MD;  Location: [facility];  Service: General;  Laterality: N/A;  . COLONOSCOPY    . ERCP N/A 10/19/2014   Procedure: ENDOSCOPIC RETROGRADE CHOLANGIOPANCREATOGRAPHY (ERCP);  Surgeon: Ladene Artist, MD;  Location: [facility];  Service: Endoscopy;  Laterality: N/A;  . INCISIONAL HERNIA REPAIR N/A 06/30/2016   Procedure: LAPAROSCOPIC REPAIR OF INCISIONAL HERNIA WITH MESH;  Surgeon: Jackolyn Confer, MD;  Location: [facility];  Service: General;  Laterality: N/A;  . INSERTION OF MESH N/A 06/30/2016   Procedure: INSERTION OF MESH;  Surgeon: Jackolyn Confer, MD;  Location: [facility];  Service: General;  Laterality: N/A;  . IR CV LINE INJECTION  11/24/2016  . IR FLUORO GUIDE PORT INSERTION RIGHT  08/28/2016  . IR US GUIDE VASC ACCESS RIGHT  08/28/2016  . JOINT REPLACEMENT    . KNEE ARTHROSCOPY Left 12/12  . KNEE ARTHROSCOPY Right 12/06  . LEFT HEART CATHETERIZATION WITH CORONARY ANGIOGRAM N/A 03/10/2014   Procedure: LEFT HEART CATHETERIZATION WITH CORONARY ANGIOGRAM;  Surgeon: Sinclair Grooms, MD;  Location: [facility] CATH LAB;  Service: Cardiovascular;  Laterality: N/A;  . PLANTAR FASCIA RELEASE Right 12/10  . radial tunnel release     right arm   . ROTATOR CUFF REPAIR Left 6/11  . tennis elbow release Right 7/04  . TOTAL KNEE ARTHROPLASTY Left 09/10/2012   Procedure: TOTAL KNEE ARTHROPLASTY- left;  Surgeon: Garald Balding, MD;  Location: [place];  Service: Orthopedics;  Laterality: Left;  Left total knee arthroplasty    Current Medications: Outpatient Medications Prior to Visit  Medication Sig Dispense Refill  . acetaminophen (TYLENOL) 650 MG CR tablet Take 1,300 mg by mouth every 8 (eight) hours as needed for pain.    Marland Kitchen aspirin EC 81 MG tablet Take 81 mg by mouth daily.    . citalopram (CELEXA) 20 MG tablet TAKE ONE (1) TABLET BY MOUTH  EVERY DAY 30 tablet 3  . Cranberry-Vitamin C-Vitamin E (CRANBERRY PLUS VITAMIN C PO) Take 1 tablet by mouth daily. 4200 mg    . dexamethasone (DECADRON) 2 MG tablet Take 4 mg by mouth See admin instructions. Taken at time of chemo treatments.    Marland Kitchen eletriptan (RELPAX) 40 MG tablet Take 1 tablet (40 mg total) by mouth every 2 (two) hours as needed for migraine. 8 tablet 11  . Insulin Glargine (TOUJEO SOLOSTAR [place]) Inject 5 Units into the skin at bedtime.    . insulin lispro (HUMALOG) 100 UNIT/ML injection Inject into the skin 3 (three) times daily before meals. Only takes when glucose is "very high"    . lidocaine-prilocaine (EMLA) cream Apply 1 application topically as needed. Apply to [facility] a Cath site one hour prior to needle stick. 30 g 2  . loratadine (CLARITIN) 10 MG tablet Take 10-20 mg by mouth 2 (two) times daily as needed for allergies.     Marland Kitchen LORazepam (ATIVAN) 0.5 MG tablet Take 1 tablet (0.5 mg total) by mouth every 8 (eight) hours as needed (for nausea). 30 tablet 0  . LYRICA 200 MG capsule TAKE ONE (1) CAPSULE BY MOUTH EVERY MORNING 30 capsule 5  . nitrofurantoin (MACRODANTIN) 100 MG capsule Take 100 mg by mouth daily.     . nitroGLYCERIN (NITROSTAT) 0.4 MG SL tablet Place 1 tablet (0.4 mg total) under the tongue every 5 (five) minutes as needed for chest pain. 25 tablet 5  . ondansetron (ZOFRAN-ODT) 8 MG disintegrating tablet Take 1 tablet (8 mg total) by mouth every 8 (eight) hours as needed for nausea or vomiting. 30 tablet 1  . oxybutynin (DITROPAN-XL) 10 MG 24 hr tablet Take 10 mg by mouth every morning.     . pantoprazole (PROTONIX) 40 MG tablet TAKE ONE (1) TABLET BY MOUTH TWO (2) TIMES DAILY 60 tablet 5  . polyethylene glycol (MIRALAX / GLYCOLAX) packet Take 17 g by mouth daily.    . ranitidine (ZANTAC) 300 MG tablet TAKE ONE TABLET BY MOUTH EVERY EVENING 30 tablet 11  . Rivaroxaban (XARELTO) 15 MG TABS tablet Take 20 mg by mouth daily. For 3 weeks, then take 20mg  once daily for 1  week      Facility-Administered Medications Prior to Visit  Medication Dose Route Frequency Provider Last Rate Last Dose  . sodium chloride flush (NS) 0.9 % injection 10 mL  10 mL Intravenous PRN Ladell Pier, MD   10 mL at 09/20/16 0840  . sodium chloride flush (NS) 0.9 % injection 10 mL  10 mL Intravenous PRN Ladell Pier, MD   10 mL at 11/23/16 0749     Allergies:  Topamax [topiramate]; Aleve [naproxen sodium]; Bee venom; Echinacea; Other; Sulfa antibiotics; and Advil [ibuprofen]   Social History   Social History  . Marital status: Married    Spouse name: Remo Lipps  . Number of children: 2  . Years of education: 16   Occupational History  . [place] History Main Topics  . Smoking status: Never Smoker  . Smokeless tobacco: Never Used     Comment: Secondhand - from family growing up, workplace intermittently  . Alcohol use 0.0 oz/week     Comment: occasionally - intermittent, no more than twice a week  . Drug use: No  . Sexual activity: Not Asked   Other Topics Concern  . None   Social History Narrative   Patient is married Remo Lipps) and lives at home with her husband and child.   Patient has two children.   Patient has a college education.   Patient is right-handed.   Patient drinks 1-2 cup of coffee/tea daily.     Family History:  The patient's family history includes Breast cancer in her cousin and other; Clotting disorder in her father; Colon cancer in her paternal grandmother; Coronary artery disease in her father; Hypertension in her brother, father, and mother; Kidney failure in her brother; Liver disease in her mother; Migraines in her brother, daughter, and father; Pancreatic cancer in her paternal grandmother; Stomach cancer in her paternal grandmother; Stroke in her mother.   ROS:   Please see the history of present illness.    Depression, anxiety, weight gain, shortness of breath.  All other systems reviewed and are  negative.   PHYSICAL EXAM:   VS:  BP 122/66   Pulse (!) 103   Ht 5\' 3"  (1.6 m)   Wt 282 lb 12.8 oz (128.3 kg)   BMI 50.10 kg/m    GEN: Well nourished, well developed, in no acute distress . Morbidly obese. HEENT: normal  Neck: no JVD, carotid bruits, or masses Cardiac: RRR; no murmurs, rubs, or gallops,no edema  Respiratory:  clear to auscultation bilaterally, normal work of breathing GI: soft, nontender, nondistended, + BS MS: no deformity or atrophy  Skin: warm and dry, no rash Neuro:  Alert and Oriented x 3, Strength and sensation are intact Psych: euthymic mood, full affect  Wt Readings from Last 3 Encounters:  01/19/17 282 lb 12.8 oz (128.3 kg)  01/03/17 280 lb 4.8 oz (127.1 kg)  12/13/16 281 lb 9.6 oz (127.7 kg)      Studies/Labs Reviewed:   EKG:  EKG  none  Recent Labs: 01/10/2017: ALT 38; BUN 11.7; Creatinine 0.9; HGB 10.7; Platelets 162; Potassium 4.4; Sodium 138   Lipid Panel    Component Value Date/Time   CHOL 146 07/28/2015 0859   TRIG 133.0 07/28/2015 0859   HDL 31.30 (L) 07/28/2015 0859   CHOLHDL 5 07/28/2015 0859   VLDL 26.6 07/28/2015 0859   LDLCALC 88 07/28/2015 0859    Additional studies/ records that were reviewed today include:  48-hour Holter monitor performed 12/29/16: Study Highlights     NSR with occasional PAC's and PVC's  No tachy- or bradyarrhythmia  No diary   Normal sinus with rare PAC's and PVC's Overall, unremarkable. No diary      ASSESSMENT:    1. Diastolic heart failure, stage B ([place])   2. Benign essential HTN   3. OSA (obstructive sleep apnea)   4. Dyslipidemia      PLAN:  In order of problems listed above:  1. No clinical evidence of volume overload. 2. Blood pressures adequately controlled. 3. Not addressed 4. Not addressed  Overall quiet a depressing situation and [place]. Stage IV adenocarcinoma of the pancreas. Encouragement was given. We will try to help as needed/ if needed    Medication  Adjustments/Labs and Tests Ordered: Current medicines are reviewed at length with the patient today.  Concerns regarding medicines are outlined above.  Medication changes, Labs and Tests ordered today are listed in the Patient Instructions below. Patient Instructions  Medication Instructions:  Your physician recommends that you continue on your current medications as directed. Please refer to the Current Medication list given to you today.   Labwork: None   Testing/Procedures: None   Follow-Up: Your physician recommends that you schedule a follow-up appointment as needed with Dr. Tamala Julian.   Any Other Special Instructions Will Be Listed Below (If Applicable).     If you need a refill on your cardiac medications before your next appointment, please call your pharmacy.      Signed, Sinclair Grooms, MD  01/19/2017 4:53 PM    [facility] [place], [place], [place]  [address] Phone: [phone]; Fax: [phone]

## 2017-01-20 ENCOUNTER — Other Ambulatory Visit: Payer: Self-pay | Admitting: Oncology

## 2017-01-22 ENCOUNTER — Other Ambulatory Visit (HOSPITAL_BASED_OUTPATIENT_CLINIC_OR_DEPARTMENT_OTHER): Payer: 59

## 2017-01-22 DIAGNOSIS — C251 Malignant neoplasm of body of pancreas: Secondary | ICD-10-CM

## 2017-01-22 DIAGNOSIS — C259 Malignant neoplasm of pancreas, unspecified: Secondary | ICD-10-CM

## 2017-01-22 DIAGNOSIS — C787 Secondary malignant neoplasm of liver and intrahepatic bile duct: Principal | ICD-10-CM

## 2017-01-23 ENCOUNTER — Other Ambulatory Visit: Payer: Self-pay | Admitting: *Deleted

## 2017-01-23 DIAGNOSIS — C259 Malignant neoplasm of pancreas, unspecified: Secondary | ICD-10-CM

## 2017-01-23 DIAGNOSIS — C787 Secondary malignant neoplasm of liver and intrahepatic bile duct: Principal | ICD-10-CM

## 2017-01-23 LAB — CANCER ANTIGEN 19-9

## 2017-01-24 ENCOUNTER — Other Ambulatory Visit (HOSPITAL_BASED_OUTPATIENT_CLINIC_OR_DEPARTMENT_OTHER): Payer: 59

## 2017-01-24 ENCOUNTER — Ambulatory Visit (HOSPITAL_BASED_OUTPATIENT_CLINIC_OR_DEPARTMENT_OTHER): Payer: 59 | Admitting: Nurse Practitioner

## 2017-01-24 ENCOUNTER — Ambulatory Visit: Payer: 59

## 2017-01-24 ENCOUNTER — Telehealth: Payer: Self-pay | Admitting: Oncology

## 2017-01-24 VITALS — BP 112/53 | HR 80 | Temp 98.2°F | Resp 18 | Ht 63.0 in | Wt 277.3 lb

## 2017-01-24 DIAGNOSIS — C787 Secondary malignant neoplasm of liver and intrahepatic bile duct: Secondary | ICD-10-CM

## 2017-01-24 DIAGNOSIS — Z452 Encounter for adjustment and management of vascular access device: Secondary | ICD-10-CM | POA: Diagnosis not present

## 2017-01-24 DIAGNOSIS — C259 Malignant neoplasm of pancreas, unspecified: Secondary | ICD-10-CM

## 2017-01-24 DIAGNOSIS — C251 Malignant neoplasm of body of pancreas: Secondary | ICD-10-CM

## 2017-01-24 DIAGNOSIS — Z95828 Presence of other vascular implants and grafts: Secondary | ICD-10-CM

## 2017-01-24 DIAGNOSIS — E119 Type 2 diabetes mellitus without complications: Secondary | ICD-10-CM

## 2017-01-24 DIAGNOSIS — I10 Essential (primary) hypertension: Secondary | ICD-10-CM | POA: Diagnosis not present

## 2017-01-24 LAB — CBC WITH DIFFERENTIAL/PLATELET
BASO%: 0.5 % (ref 0.0–2.0)
Basophils Absolute: 0 10*3/uL (ref 0.0–0.1)
EOS%: 3.2 % (ref 0.0–7.0)
Eosinophils Absolute: 0.3 10*3/uL (ref 0.0–0.5)
HCT: 35.6 % (ref 34.8–46.6)
HGB: 11.2 g/dL — ABNORMAL LOW (ref 11.6–15.9)
LYMPH#: 1.4 10*3/uL (ref 0.9–3.3)
LYMPH%: 16 % (ref 14.0–49.7)
MCH: 29.6 pg (ref 25.1–34.0)
MCHC: 31.5 g/dL (ref 31.5–36.0)
MCV: 94.2 fL (ref 79.5–101.0)
MONO#: 1.2 10*3/uL — ABNORMAL HIGH (ref 0.1–0.9)
MONO%: 13.8 % (ref 0.0–14.0)
NEUT#: 5.6 10*3/uL (ref 1.5–6.5)
NEUT%: 66.5 % (ref 38.4–76.8)
Platelets: 189 10*3/uL (ref 145–400)
RBC: 3.78 10*6/uL (ref 3.70–5.45)
RDW: 15.6 % — ABNORMAL HIGH (ref 11.2–14.5)
WBC: 8.5 10*3/uL (ref 3.9–10.3)

## 2017-01-24 LAB — COMPREHENSIVE METABOLIC PANEL
ALT: 22 U/L (ref 0–55)
AST: 16 U/L (ref 5–34)
Albumin: 3.3 g/dL — ABNORMAL LOW (ref 3.5–5.0)
Alkaline Phosphatase: 125 U/L (ref 40–150)
Anion Gap: 9 mEq/L (ref 3–11)
BUN: 8.5 mg/dL (ref 7.0–26.0)
CHLORIDE: 104 meq/L (ref 98–109)
CO2: 25 meq/L (ref 22–29)
CREATININE: 0.9 mg/dL (ref 0.6–1.1)
Calcium: 9.7 mg/dL (ref 8.4–10.4)
EGFR: >60 mL/min/{1.73_m2} (ref >60)
Glucose: 271 mg/dl — ABNORMAL HIGH (ref 70–140)
POTASSIUM: 4.1 meq/L (ref 3.5–5.1)
SODIUM: 138 meq/L (ref 136–145)
Total Bilirubin: 0.46 mg/dL (ref 0.20–1.20)
Total Protein: 6.8 g/dL (ref 6.4–8.3)

## 2017-01-24 MED ORDER — HEPARIN SOD (PORK) LOCK FLUSH 100 UNIT/ML IV SOLN
500.0000 [IU] | Freq: Once | INTRAVENOUS | Status: AC | PRN
  Administered 2017-01-24: 500 [IU] via INTRAVENOUS
  Filled 2017-01-24: qty 5

## 2017-01-24 MED ORDER — SODIUM CHLORIDE 0.9% FLUSH
10.0000 mL | INTRAVENOUS | Status: DC | PRN
  Administered 2017-01-24: 10 mL via INTRAVENOUS
  Filled 2017-01-24: qty 10

## 2017-01-24 NOTE — Telephone Encounter (Signed)
Per 10/31 los per loren I cancelled all other appointments until further notice. Gave avs and calendar for November

## 2017-01-24 NOTE — Progress Notes (Addendum)
[place] OFFICE PROGRESS NOTE   Diagnosis:  Pancreas cancer  INTERVAL HISTORY:   Heather Frederick returns as scheduled. She completed cycle 6 day 8 gemcitabine/Abraxane 01/10/2017. She denies nausea. Bowels moving with the aid of a laxative. Numbness in her feet has improved. She reports a good appetite. She has increased pain at the right abdomen and back. The pain is similar to when she was initially diagnosed. She takes Tylenol with partial relief. She has a narcotic at home but does not like to take it.  Objective:  Vital signs in last 24 hours:  Blood pressure (!) 112/53, pulse 80, temperature 98.2 F (36.8 C), temperature source Oral, resp. rate 18, height _0  (1.6 m), weight 277 lb 4.8 oz (125.8 kg), SpO2 98 %.    HEENT: no thrush or ulcers. Resp: lungs clear bilaterally. Cardio: regular rate and rhythm. GI: no hepatomegaly. No mass. Vascular: no leg edema. Neuro: vibratory sense mildly decreased to intact over the fingertips per tuning fork exam.  Porta cath without erythema.  Lab Results:  Lab Results  Component Value Date   WBC 8.5 01/24/2017   HGB 11.2 (L) 01/24/2017   HCT 35.6 01/24/2017   MCV 94.2 01/24/2017   PLT 189 01/24/2017   NEUTROABS 5.6 01/24/2017    Imaging:  No results found.  Medications: I have reviewed the patient's current medications.  Assessment/Plan: 1. Metastatic pancreas cancer ? Pancreas body mass and liver metastases noted on CT abdomen/pelvis 08/11/2016 ? Ultrasound-guided biopsy of a right liver lesion 08/16/2016 revealed poorly differentiated adenocarcinoma consistent with pancreas cancer ? Foundation 1-microsatellite stable; tumor mutational burden 1; ERBB2 amplification ? Cycle 1 gemcitabine/Abraxane 09/13/2016; 09/29/2016 ? Cycle 2 gemcitabine/Abraxane 10/11/2016, 10/18/2016 ? Cycle 3 gemcitabine/Abraxane 11/01/2016, 11/08/2016 ? Cycle 4 gemcitabine/Abraxane 11/23/2016,11/29/2016 ? CT chest 12/06/2016-liver  lesions appear smaller ? Cycle 5 gemcitabine/Abraxane 12/13/2016 ? CT abdomen/pelvis 01/01/2017-new and enlarging hepatic masses. Enlarging pancreatic mass. ? Cycle 6 gemcitabine/Abraxane 01/03/2017  2. Right posterior lateral chest pain-potentially related to a lateral liver metastasis-resolved  3. Hypertension  4. Sleep apnea  5. Chronic low back pain  6. Recurrent urinary tract infections  7. Depression  8. Migraine headaches  9. Port-A-Cath placement 08/28/2016  10. Rash following cycle 1 gemcitabine/Abraxane-drug rash?  11. Nausea/vomiting following cycle 1 gemcitabine/Abraxane-antiemetic regimen adjusted with addition of Aloxi  12. Diabetes  13. Right cephalic vein thrombosis [id number]/57/[id number] with Xarelto  14. CT chest 12/06/2016 done to evaluate dyspnea-several 3-4 mm nodular opacities in the lung parenchyma etiology uncertain. Known mass in the body of the pancreas measuring 3.3 x 2.4 cm;small enhancing lesion in the anterior dome of the liver measures 8 mm.  15. Right forearm rash following cycle 5 day 8 gemcitabine/Abraxane. Resolved    Disposition: Heather Frederick appears unchanged. She has completed 6 cycles of gemcitabine/Abraxane.The CA-19-9 tumor marker is rising and she has increased pain. Dr. Benay Spice recommends discontinuation of gemcitabine/Abraxane and initiation of FOLFIRINOX. We discussed potential toxicities including bone marrow toxicity, hair loss, allergic reaction. We reviewed potential toxicities associated with 5-FU including mouth sores, diarrhea, hand-foot syndrome, skin hyperpigmentation. We discussed potential toxicities associated with irinotecan including early and late phase diarrhea. We discussed the neurotoxicity associated with oxaliplatin. She agrees to proceed. The plan is for FOLFOX for one or 2 cycles to assess tolerance and then consider adding irinotecan.  She will return for cycle 1 FOLFOX 01/31/2017. We  will see her in follow-up prior to cycle 2 on 02/19/2017. She will contact the office in the  interim with any problems.  Patient seen with Dr. Benay Spice.  40 minutes were spent on the visit today with the majority of that time involved in counseling/coordination of care.    Ned Card ANP/GNP-BC   01/24/2017  10:46 AM  This was a shared visit with Ned Card.  Heather Frederick was interviewed and examined.  The CA 19-9 is higher and there is clinical evidence of disease progression.  We discussed treatment options with [name] and her husband.  The plan is to begin salvage treatment with FOLFIRINOX.  She will complete a first cycle on the 01/31/2017.  We will hold irinotecan with cycle 1. We reviewed the potential toxicities associated with this regimen including the chance of hematologic toxicity, diarrhea, allergic reaction, neuropathy, hand/foot syndrome, and skin toxicity.  She agrees to proceed. A chemotherapy plan was entered today.   Julieanne Manson, MD

## 2017-01-25 NOTE — Progress Notes (Signed)
DISCONTINUE OFF PATHWAY REGIMEN - Pancreatic   [id number]:Nab-Paclitaxel (Abraxane(R)) 125 mg/m2 D1, 8, 15 + Gemcitabine 1,000 mg/m2 D1, 8, 15 q28 Days:   A cycle is every 28 days:     Nab-paclitaxel (protein bound)      Gemcitabine   **Always confirm dose/schedule in your pharmacy ordering system**    REASON: Disease Progression PRIOR TREATMENT: Off Pathway: Nab-Paclitaxel (Abraxane(R)) 125 mg/m2 D1, 8, 15 + Gemcitabine 1,000 mg/m2 D1, 8, 15 q28 Days TREATMENT RESPONSE: Stable Disease (SD)  START OFF PATHWAY REGIMEN - Pancreatic   [id number]:mFOLFIRINOX q14 Days:   A cycle is every 14 days:     Oxaliplatin      Leucovorin      Irinotecan      5-Fluorouracil   **Always confirm dose/schedule in your pharmacy ordering system**    Patient Characteristics: Adenocarcinoma, Metastatic Disease, Second Line, MSS/pMMR or MSI Unknown, If Gemcitabine/Nab-Paclitaxel (Abraxane(R)) or If Gemcitabine First Line Histology: Adenocarcinoma Current evidence of distant metastases<= Yes AJCC T Category: Staged < 8th Ed. AJCC N Category: Staged < 8th Ed. AJCC M Category: Staged < 8th Ed. AJCC 8 Stage Grouping: Staged < 8th Ed. Line of Therapy: Second Line Would you be surprised if this patient died  in the next year<= I would NOT be surprised if this patient died in the next year Microsatellite/Mismatch Repair Status: Unknown Intent of Therapy: Non-Curative / Palliative Intent, Discussed with Patient 

## 2017-01-28 ENCOUNTER — Other Ambulatory Visit: Payer: Self-pay | Admitting: Oncology

## 2017-01-31 ENCOUNTER — Ambulatory Visit: Payer: 59

## 2017-01-31 ENCOUNTER — Other Ambulatory Visit: Payer: Self-pay | Admitting: *Deleted

## 2017-01-31 ENCOUNTER — Ambulatory Visit (HOSPITAL_BASED_OUTPATIENT_CLINIC_OR_DEPARTMENT_OTHER): Payer: 59

## 2017-01-31 ENCOUNTER — Other Ambulatory Visit (HOSPITAL_BASED_OUTPATIENT_CLINIC_OR_DEPARTMENT_OTHER): Payer: 59

## 2017-01-31 DIAGNOSIS — C259 Malignant neoplasm of pancreas, unspecified: Secondary | ICD-10-CM

## 2017-01-31 DIAGNOSIS — C787 Secondary malignant neoplasm of liver and intrahepatic bile duct: Secondary | ICD-10-CM

## 2017-01-31 DIAGNOSIS — C251 Malignant neoplasm of body of pancreas: Secondary | ICD-10-CM

## 2017-01-31 DIAGNOSIS — Z5111 Encounter for antineoplastic chemotherapy: Secondary | ICD-10-CM | POA: Diagnosis not present

## 2017-01-31 LAB — COMPREHENSIVE METABOLIC PANEL
ALBUMIN: 3.2 g/dL — AB (ref 3.5–5.0)
ALK PHOS: 129 U/L (ref 40–150)
ALT: 19 U/L (ref 0–55)
AST: 20 U/L (ref 5–34)
Anion Gap: 9 mEq/L (ref 3–11)
BILIRUBIN TOTAL: 0.47 mg/dL (ref 0.20–1.20)
BUN: 8.6 mg/dL (ref 7.0–26.0)
CO2: 24 mEq/L (ref 22–29)
CREATININE: 0.8 mg/dL (ref 0.6–1.1)
Calcium: 9.2 mg/dL (ref 8.4–10.4)
Chloride: 104 mEq/L (ref 98–109)
GLUCOSE: 230 mg/dL — AB (ref 70–140)
Potassium: 4 mEq/L (ref 3.5–5.1)
SODIUM: 138 meq/L (ref 136–145)
TOTAL PROTEIN: 6.8 g/dL (ref 6.4–8.3)

## 2017-01-31 LAB — CBC WITH DIFFERENTIAL/PLATELET
BASO%: 0.8 % (ref 0.0–2.0)
Basophils Absolute: 0.1 10*3/uL (ref 0.0–0.1)
EOS%: 4.1 % (ref 0.0–7.0)
Eosinophils Absolute: 0.3 10*3/uL (ref 0.0–0.5)
HCT: 34 % — ABNORMAL LOW (ref 34.8–46.6)
HGB: 11 g/dL — ABNORMAL LOW (ref 11.6–15.9)
LYMPH#: 1.2 10*3/uL (ref 0.9–3.3)
LYMPH%: 19 % (ref 14.0–49.7)
MCH: 29.4 pg (ref 25.1–34.0)
MCHC: 32.5 g/dL (ref 31.5–36.0)
MCV: 90.4 fL (ref 79.5–101.0)
MONO#: 0.9 10*3/uL (ref 0.1–0.9)
MONO%: 14.7 % — AB (ref 0.0–14.0)
NEUT#: 3.9 10*3/uL (ref 1.5–6.5)
NEUT%: 61.4 % (ref 38.4–76.8)
Platelets: 228 10*3/uL (ref 145–400)
RBC: 3.76 10*6/uL (ref 3.70–5.45)
RDW: 15.8 % — ABNORMAL HIGH (ref 11.2–14.5)
WBC: 6.4 10*3/uL (ref 3.9–10.3)

## 2017-01-31 MED ORDER — DEXTROSE 5 % IV SOLN
400.0000 mg/m2 | Freq: Once | INTRAVENOUS | Status: AC
  Administered 2017-01-31: 944 mg via INTRAVENOUS
  Filled 2017-01-31: qty 47.2

## 2017-01-31 MED ORDER — SODIUM CHLORIDE 0.9 % IV SOLN
2400.0000 mg/m2 | INTRAVENOUS | Status: DC
  Administered 2017-01-31: 5650 mg via INTRAVENOUS
  Filled 2017-01-31: qty 113

## 2017-01-31 MED ORDER — SODIUM CHLORIDE 0.9 % IV SOLN
Freq: Once | INTRAVENOUS | Status: AC
  Administered 2017-01-31: 12:00:00 via INTRAVENOUS
  Filled 2017-01-31: qty 5

## 2017-01-31 MED ORDER — OXALIPLATIN CHEMO INJECTION 100 MG/20ML
85.0000 mg/m2 | Freq: Once | INTRAVENOUS | Status: AC
  Administered 2017-01-31: 200 mg via INTRAVENOUS
  Filled 2017-01-31: qty 40

## 2017-01-31 MED ORDER — DEXTROSE 5 % IV SOLN
Freq: Once | INTRAVENOUS | Status: AC
  Administered 2017-01-31: 10:00:00 via INTRAVENOUS

## 2017-01-31 MED ORDER — PALONOSETRON HCL INJECTION 0.25 MG/5ML
INTRAVENOUS | Status: AC
Start: 2017-01-31 — End: 2017-01-31
  Filled 2017-01-31: qty 5

## 2017-01-31 MED ORDER — PALONOSETRON HCL INJECTION 0.25 MG/5ML
0.2500 mg | Freq: Once | INTRAVENOUS | Status: AC
  Administered 2017-01-31: 0.25 mg via INTRAVENOUS

## 2017-01-31 NOTE — Progress Notes (Signed)
Discharge and pump instructions reviewed with the patient, Verbalized understanding.

## 2017-01-31 NOTE — Patient Instructions (Signed)
Cancer Center Discharge Instructions for Patients Receiving Chemotherapy  Today you received the following chemotherapy agents Oxaliplatin, Leucovorin and Adrucil   To help prevent nausea and vomiting after your treatment, we encourage you to take your nausea medication as directed.    If you develop nausea and vomiting that is not controlled by your nausea medication, call the clinic.   BELOW ARE SYMPTOMS THAT SHOULD BE REPORTED IMMEDIATELY:  *FEVER GREATER THAN 100.5 F  *CHILLS WITH OR WITHOUT FEVER  NAUSEA AND VOMITING THAT IS NOT CONTROLLED WITH YOUR NAUSEA MEDICATION  *UNUSUAL SHORTNESS OF BREATH  *UNUSUAL BRUISING OR BLEEDING  TENDERNESS IN MOUTH AND THROAT WITH OR WITHOUT PRESENCE OF ULCERS  *URINARY PROBLEMS  *BOWEL PROBLEMS  UNUSUAL RASH Items with * indicate a potential emergency and should be followed up as soon as possible.  Feel free to call the clinic should you have any questions or concerns. The clinic phone number is [phone].  Please show the CHEMO ALERT CARD at check-in to the Emergency Department and triage nurse.  Oxaliplatin Injection What is this medicine? OXALIPLATIN (ox AL i PLA tin) is a chemotherapy drug. It targets fast dividing cells, like cancer cells, and causes these cells to die. This medicine is used to treat cancers of the colon and rectum, and many other cancers. This medicine may be used for other purposes; ask your health care provider or pharmacist if you have questions. COMMON BRAND NAME(S): Eloxatin What should I tell my health care provider before I take this medicine? They need to know if you have any of these conditions: -kidney disease -an unusual or allergic reaction to oxaliplatin, other chemotherapy, other medicines, foods, dyes, or preservatives -pregnant or trying to get pregnant -breast-feeding How should I use this medicine? This drug is given as an infusion into a vein. It is administered in a  hospital or clinic by a specially trained health care professional. Talk to your pediatrician regarding the use of this medicine in children. Special care may be needed. Overdosage: If you think you have taken too much of this medicine contact a poison control center or emergency room at once. NOTE: This medicine is only for you. Do not share this medicine with others. What if I miss a dose? It is important not to miss a dose. Call your doctor or health care professional if you are unable to keep an appointment. What may interact with this medicine? -medicines to increase blood counts like filgrastim, pegfilgrastim, sargramostim -probenecid -some antibiotics like amikacin, gentamicin, neomycin, polymyxin B, streptomycin, tobramycin -zalcitabine Talk to your doctor or health care professional before taking any of these medicines: -acetaminophen -aspirin -ibuprofen -ketoprofen -naproxen This list may not describe all possible interactions. Give your health care provider a list of all the medicines, herbs, non-prescription drugs, or dietary supplements you use. Also tell them if you smoke, drink alcohol, or use illegal drugs. Some items may interact with your medicine. What should I watch for while using this medicine? Your condition will be monitored carefully while you are receiving this medicine. You will need important blood work done while you are taking this medicine. This medicine can make you more sensitive to cold. Do not drink cold drinks or use ice. Cover exposed skin before coming in contact with cold temperatures or cold objects. When out in cold weather wear warm clothing and cover your mouth and nose to warm the air that goes into your lungs. Tell your doctor if you get sensitive to   the cold. This drug may make you feel generally unwell. This is not uncommon, as chemotherapy can affect healthy cells as well as cancer cells. Report any side effects. Continue your course of treatment  even though you feel ill unless your doctor tells you to stop. In some cases, you may be given additional medicines to help with side effects. Follow all directions for their use. Call your doctor or health care professional for advice if you get a fever, chills or sore throat, or other symptoms of a cold or flu. Do not treat yourself. This drug decreases your body's ability to fight infections. Try to avoid being around people who are sick. This medicine may increase your risk to bruise or bleed. Call your doctor or health care professional if you notice any unusual bleeding. Be careful brushing and flossing your teeth or using a toothpick because you may get an infection or bleed more easily. If you have any dental work done, tell your dentist you are receiving this medicine. Avoid taking products that contain aspirin, acetaminophen, ibuprofen, naproxen, or ketoprofen unless instructed by your doctor. These medicines may hide a fever. Do not become pregnant while taking this medicine. Women should inform their doctor if they wish to become pregnant or think they might be pregnant. There is a potential for serious side effects to an unborn child. Talk to your health care professional or pharmacist for more information. Do not breast-feed an infant while taking this medicine. Call your doctor or health care professional if you get diarrhea. Do not treat yourself. What side effects may I notice from receiving this medicine? Side effects that you should report to your doctor or health care professional as soon as possible: -allergic reactions like skin rash, itching or hives, swelling of the face, lips, or tongue -low blood counts - This drug may decrease the number of white blood cells, red blood cells and platelets. You may be at increased risk for infections and bleeding. -signs of infection - fever or chills, cough, sore throat, pain or difficulty passing urine -signs of decreased platelets or  bleeding - bruising, pinpoint red spots on the skin, black, tarry stools, nosebleeds -signs of decreased red blood cells - unusually weak or tired, fainting spells, lightheadedness -breathing problems -chest pain, pressure -cough -diarrhea -jaw tightness -mouth sores -nausea and vomiting -pain, swelling, redness or irritation at the injection site -pain, tingling, numbness in the hands or feet -problems with balance, talking, walking -redness, blistering, peeling or loosening of the skin, including inside the mouth -trouble passing urine or change in the amount of urine Side effects that usually do not require medical attention (report to your doctor or health care professional if they continue or are bothersome): -changes in vision -constipation -hair loss -loss of appetite -metallic taste in the mouth or changes in taste -stomach pain This list may not describe all possible side effects. Call your doctor for medical advice about side effects. You may report side effects to FDA at 1-800-FDA-1088. Where should I keep my medicine? This drug is given in a hospital or clinic and will not be stored at home. NOTE: This sheet is a summary. It may not cover all possible information. If you have questions about this medicine, talk to your doctor, pharmacist, or health care provider.  2018 Elsevier/Gold Standard (2007-10-08 17:22:47)  Leucovorin injection What is this medicine? LEUCOVORIN (loo koe VOR in) is used to prevent or treat the harmful effects of some medicines. This medicine is used   to treat anemia caused by a low amount of folic acid in the body. It is also used with 5-fluorouracil (5-FU) to treat colon cancer. This medicine may be used for other purposes; ask your health care provider or pharmacist if you have questions. What should I tell my health care provider before I take this medicine? They need to know if you have any of these conditions: -anemia from low levels of vitamin  B-12 in the blood -an unusual or allergic reaction to leucovorin, folic acid, other medicines, foods, dyes, or preservatives -pregnant or trying to get pregnant -breast-feeding How should I use this medicine? This medicine is for injection into a muscle or into a vein. It is given by a health care professional in a hospital or clinic setting. Talk to your pediatrician regarding the use of this medicine in children. Special care may be needed. Overdosage: If you think you have taken too much of this medicine contact a poison control center or emergency room at once. NOTE: This medicine is only for you. Do not share this medicine with others. What if I miss a dose? This does not apply. What may interact with this medicine? -capecitabine -fluorouracil -phenobarbital -phenytoin -primidone -trimethoprim-sulfamethoxazole This list may not describe all possible interactions. Give your health care provider a list of all the medicines, herbs, non-prescription drugs, or dietary supplements you use. Also tell them if you smoke, drink alcohol, or use illegal drugs. Some items may interact with your medicine. What should I watch for while using this medicine? Your condition will be monitored carefully while you are receiving this medicine. This medicine may increase the side effects of 5-fluorouracil, 5-FU. Tell your doctor or health care professional if you have diarrhea or mouth sores that do not get better or that get worse. What side effects may I notice from receiving this medicine? Side effects that you should report to your doctor or health care professional as soon as possible: -allergic reactions like skin rash, itching or hives, swelling of the face, lips, or tongue -breathing problems -fever, infection -mouth sores -unusual bleeding or bruising -unusually weak or tired Side effects that usually do not require medical attention (report to your doctor or health care professional if they  continue or are bothersome): -constipation or diarrhea -loss of appetite -nausea, vomiting This list may not describe all possible side effects. Call your doctor for medical advice about side effects. You may report side effects to FDA at 1-800-FDA-1088. Where should I keep my medicine? This drug is given in a hospital or clinic and will not be stored at home. NOTE: This sheet is a summary. It may not cover all possible information. If you have questions about this medicine, talk to your doctor, pharmacist, or health care provider.  2018 Elsevier/Gold Standard (2007-09-17 16:50:29)  Fluorouracil, 5-FU injection What is this medicine? FLUOROURACIL, 5-FU (flure oh YOOR a sil) is a chemotherapy drug. It slows the growth of cancer cells. This medicine is used to treat many types of cancer like breast cancer, colon or rectal cancer, pancreatic cancer, and stomach cancer. This medicine may be used for other purposes; ask your health care provider or pharmacist if you have questions. COMMON BRAND NAME(S): Adrucil What should I tell my health care provider before I take this medicine? They need to know if you have any of these conditions: -blood disorders -dihydropyrimidine dehydrogenase (DPD) deficiency -infection (especially a virus infection such as chickenpox, cold sores, or herpes) -kidney disease -liver disease -malnourished, poor nutrition -  recent or ongoing radiation therapy -an unusual or allergic reaction to fluorouracil, other chemotherapy, other medicines, foods, dyes, or preservatives -pregnant or trying to get pregnant -breast-feeding How should I use this medicine? This drug is given as an infusion or injection into a vein. It is administered in a hospital or clinic by a specially trained health care professional. Talk to your pediatrician regarding the use of this medicine in children. Special care may be needed. Overdosage: If you think you have taken too much of this medicine  contact a poison control center or emergency room at once. NOTE: This medicine is only for you. Do not share this medicine with others. What if I miss a dose? It is important not to miss your dose. Call your doctor or health care professional if you are unable to keep an appointment. What may interact with this medicine? -allopurinol -cimetidine -dapsone -digoxin -hydroxyurea -leucovorin -levamisole -medicines for seizures like ethotoin, fosphenytoin, phenytoin -medicines to increase blood counts like filgrastim, pegfilgrastim, sargramostim -medicines that treat or prevent blood clots like warfarin, enoxaparin, and dalteparin -methotrexate -metronidazole -pyrimethamine -some other chemotherapy drugs like busulfan, cisplatin, estramustine, vinblastine -trimethoprim -trimetrexate -vaccines Talk to your doctor or health care professional before taking any of these medicines: -acetaminophen -aspirin -ibuprofen -ketoprofen -naproxen This list may not describe all possible interactions. Give your health care provider a list of all the medicines, herbs, non-prescription drugs, or dietary supplements you use. Also tell them if you smoke, drink alcohol, or use illegal drugs. Some items may interact with your medicine. What should I watch for while using this medicine? Visit your doctor for checks on your progress. This drug may make you feel generally unwell. This is not uncommon, as chemotherapy can affect healthy cells as well as cancer cells. Report any side effects. Continue your course of treatment even though you feel ill unless your doctor tells you to stop. In some cases, you may be given additional medicines to help with side effects. Follow all directions for their use. Call your doctor or health care professional for advice if you get a fever, chills or sore throat, or other symptoms of a cold or flu. Do not treat yourself. This drug decreases your body's ability to fight  infections. Try to avoid being around people who are sick. This medicine may increase your risk to bruise or bleed. Call your doctor or health care professional if you notice any unusual bleeding. Be careful brushing and flossing your teeth or using a toothpick because you may get an infection or bleed more easily. If you have any dental work done, tell your dentist you are receiving this medicine. Avoid taking products that contain aspirin, acetaminophen, ibuprofen, naproxen, or ketoprofen unless instructed by your doctor. These medicines may hide a fever. Do not become pregnant while taking this medicine. Women should inform their doctor if they wish to become pregnant or think they might be pregnant. There is a potential for serious side effects to an unborn child. Talk to your health care professional or pharmacist for more information. Do not breast-feed an infant while taking this medicine. Men should inform their doctor if they wish to father a child. This medicine may lower sperm counts. Do not treat diarrhea with over the counter products. Contact your doctor if you have diarrhea that lasts more than 2 days or if it is severe and watery. This medicine can make you more sensitive to the sun. Keep out of the sun. If you cannot avoid being in   the sun, wear protective clothing and use sunscreen. Do not use sun lamps or tanning beds/booths. What side effects may I notice from receiving this medicine? Side effects that you should report to your doctor or health care professional as soon as possible: -allergic reactions like skin rash, itching or hives, swelling of the face, lips, or tongue -low blood counts - this medicine may decrease the number of white blood cells, red blood cells and platelets. You may be at increased risk for infections and bleeding. -signs of infection - fever or chills, cough, sore throat, pain or difficulty passing urine -signs of decreased platelets or bleeding - bruising,  pinpoint red spots on the skin, black, tarry stools, blood in the urine -signs of decreased red blood cells - unusually weak or tired, fainting spells, lightheadedness -breathing problems -changes in vision -chest pain -mouth sores -nausea and vomiting -pain, swelling, redness at site where injected -pain, tingling, numbness in the hands or feet -redness, swelling, or sores on hands or feet -stomach pain -unusual bleeding Side effects that usually do not require medical attention (report to your doctor or health care professional if they continue or are bothersome): -changes in finger or toe nails -diarrhea -dry or itchy skin -hair loss -headache -loss of appetite -sensitivity of eyes to the light -stomach upset -unusually teary eyes This list may not describe all possible side effects. Call your doctor for medical advice about side effects. You may report side effects to FDA at 1-800-FDA-1088. Where should I keep my medicine? This drug is given in a hospital or clinic and will not be stored at home. NOTE: This sheet is a summary. It may not cover all possible information. If you have questions about this medicine, talk to your doctor, pharmacist, or health care provider.  2018 Elsevier/Gold Standard (2007-07-17 13:53:16)  

## 2017-02-01 ENCOUNTER — Telehealth: Payer: Self-pay | Admitting: *Deleted

## 2017-02-01 ENCOUNTER — Telehealth: Payer: Self-pay | Admitting: Oncology

## 2017-02-01 NOTE — Telephone Encounter (Signed)
Called pt, reviewed FOLFOX side effects and management. She has implemented reminders around the house to keep slippers/ shoes on and to avoid reaching into the refrigerator due to cold sensitivity.  Pt reports increased back pain today, had to take a dose of Hydrocodone. She is taking Miralax daily. Denies any issues with infusion pump or implanted port. Informed pt that chemo side effects including fatigue and nausea may kick around time of pump disconnect. Reviewed reportable symptoms, office call back #.

## 2017-02-01 NOTE — Telephone Encounter (Signed)
Spoke with patient and confirmed her pump stop appt that was added per 11/7 sch msg.

## 2017-02-01 NOTE — Telephone Encounter (Signed)
-----   Message from [place], [place] sent at 01/31/2017  4:07 PM EST ----- Regarding: Dr. Benay Spice chemo follow up Pt of Dr. Benay Spice first time FOLFOX, infusion well tolerated

## 2017-02-02 ENCOUNTER — Ambulatory Visit (HOSPITAL_BASED_OUTPATIENT_CLINIC_OR_DEPARTMENT_OTHER): Payer: 59 | Admitting: Medical

## 2017-02-02 ENCOUNTER — Telehealth: Payer: Self-pay

## 2017-02-02 VITALS — BP 120/66 | HR 70 | Temp 98.0°F | Resp 18 | Ht 63.0 in | Wt 275.7 lb

## 2017-02-02 DIAGNOSIS — Z95828 Presence of other vascular implants and grafts: Secondary | ICD-10-CM

## 2017-02-02 DIAGNOSIS — G893 Neoplasm related pain (acute) (chronic): Secondary | ICD-10-CM | POA: Diagnosis not present

## 2017-02-02 DIAGNOSIS — R079 Chest pain, unspecified: Secondary | ICD-10-CM

## 2017-02-02 DIAGNOSIS — M62830 Muscle spasm of back: Secondary | ICD-10-CM

## 2017-02-02 DIAGNOSIS — C259 Malignant neoplasm of pancreas, unspecified: Secondary | ICD-10-CM | POA: Diagnosis not present

## 2017-02-02 DIAGNOSIS — C787 Secondary malignant neoplasm of liver and intrahepatic bile duct: Secondary | ICD-10-CM

## 2017-02-02 MED ORDER — CYCLOBENZAPRINE HCL 5 MG PO TABS: 5.0000 mg | ORAL_TABLET | Freq: Three times a day (TID) | ORAL | 0 refills | Status: DC | PRN

## 2017-02-02 MED ORDER — SODIUM CHLORIDE 0.9% FLUSH
10.0000 mL | INTRAVENOUS | Status: DC | PRN
  Administered 2017-02-02: 10 mL via INTRAVENOUS
  Filled 2017-02-02: qty 10

## 2017-02-02 MED ORDER — MORPHINE SULFATE 4 MG/ML IJ SOLN
2.0000 mg | Freq: Once | INTRAMUSCULAR | Status: AC
  Administered 2017-02-02: 2 mg via INTRAVENOUS
  Filled 2017-02-02: qty 1

## 2017-02-02 MED ORDER — OXYCODONE HCL 5 MG PO TABS: 5.0000 mg | ORAL_TABLET | ORAL | 0 refills | Status: DC | PRN

## 2017-02-02 MED ORDER — MORPHINE SULFATE (PF) 4 MG/ML IV SOLN
INTRAVENOUS | Status: AC
  Filled 2017-02-02: qty 1

## 2017-02-02 MED ORDER — HEPARIN SOD (PORK) LOCK FLUSH 100 UNIT/ML IV SOLN
500.0000 [IU] | Freq: Once | INTRAVENOUS | Status: AC | PRN
  Administered 2017-02-02: 500 [IU] via INTRAVENOUS
  Filled 2017-02-02: qty 5

## 2017-02-02 MED ORDER — SODIUM CHLORIDE 0.9 % IV SOLN
Freq: Once | INTRAVENOUS | Status: AC
  Administered 2017-02-02: 15:00:00 via INTRAVENOUS

## 2017-02-02 NOTE — Patient Instructions (Signed)
Pain Medicine Instructions How can pain medicine affect me? You were given a prescription for pain medicine. This medicine may make you tired or drowsy and may affect your ability to think clearly. Pain medicine may also affect your ability to drive or perform certain physical activities. It may not be possible to make all of your pain go away, but you should be comfortable enough to move, breathe, and take care of yourself. How often should I take pain medicine and how much should I take?  Take pain medicine only as directed by your health care provider and only as needed for pain.  You do not need to take pain medicine if you are not having pain, unless directed by your health care provider.  You can take less than the prescribed dose if you find that a smaller amount of medicine controls your pain. What restrictions do I have while taking pain medicine? Follow these instructions after you start taking pain medicine, while you are taking the medicine, and for 8 hours after you stop taking the medicine:  Do not drive.  Do not operate machinery.  Do not operate power tools.  Do not sign legal documents.  Do not drink alcohol.  Do not take sleeping pills.  Do not supervise children by yourself.  Do not participate in activities that require climbing or being in high places.  Do not enter a body of water-such as a lake, river, ocean, spa, or swimming pool-without an adult nearby who can monitor and help you.  How can I keep others safe while I am taking pain medicine?  Store your pain medicine as directed by your health care provider. Make sure that it is placed where children and pets cannot reach it.  Never share your pain medicine with anyone.  Do not save any leftover pills. If you have any leftover pain medicine, get rid of it or destroy it as directed by your health care provider. What else do I need to know about taking pain medicine?  Use a stool softener if you become  constipated from your pain medicine. Increasing your intake of fruits and vegetables will also help with constipation.  Write down the times when you take your pain medicine. Look at the times before you take your next dose of medicine. It is easy to become confused while on pain medicine. Recording the times helps you to avoid an overdose.  If your pain is severe, do not try to treat it yourself by taking more pills than instructed on your prescription. Contact your health care provider for help.  You may have been prescribed a pain medicine that contains acetaminophen. Do not take any other acetaminophen while taking this medicine. An overdose of acetaminophen can result in severe liver damage. Acetaminophen is found in many over-the-counter (OTC) and prescription medicines. If you are taking any medicines in addition to your pain medicine, check the active ingredients on those medicines to see if acetaminophen is listed. When should I call my health care provider?  Your medicine is not helping to make the pain go away.  You vomit or have diarrhea shortly after taking the medicine.  You develop new pain in areas that did not hurt before.  You have an allergic reaction to your medicine. This may include: ? Itchiness. ? Swelling. ? Dizziness. ? Developing a new rash. When should I call 911 or go to the emergency room?  You feel dizzy or you faint.  You are very confused or   disoriented.  You repeatedly vomit.  Your skin or lips turn pale or bluish in color.  You have shortness of breath or you are breathing much more slowly than usual.  You have a severe allergic reaction to your medicine. This includes: ? Developing tongue swelling. ? Having difficulty breathing. This information is not intended to replace advice given to you by your health care provider. Make sure you discuss any questions you have with your health care provider. Document Released: 06/19/2000 Document Revised:  10/01/2015 Document Reviewed: 01/15/2014 Elsevier Interactive Patient Education  2018 [employer]  

## 2017-02-02 NOTE — Telephone Encounter (Signed)
Pt called with c/o abdominal pain. All over abdomen, stabbing more in upper quadrants. Radiates to back and is excruciating in the back. She started using hydrocodone 5-325 yesterday, using 1 every 4 hours. Not helping, decreasing pain "ever so slightly". Had small to medium BM this AM. She is currently on 5FU pump due to complete at 2 pm today.   S/w Dr Benay Spice and gave her option of increasing pain med to oxycodone or seeing [facility].  Pt chose [facility]. Sent inbasket to cancel pump dc appt and add [facility] at 2 pm

## 2017-02-02 NOTE — Progress Notes (Signed)
[facility] Progress Note   [name] [id number] [birth year] 61 y.o.  Heather Frederick is managed by Dr. 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  She was also given a prescription for oxycodone IR 5 mg 1 every 4 hours as needed for pain.  Pancreatic carcinoma with metastatic disease to the liver: The patient will return on 02/19/2017 for consideration of cycle #2 of FOLFOX with plans to proceed to FOLFIRINOX of the patient tolerate this treatment.  Muscle spasms of the back: Patient was given a prescription for Flexeril 5 mg p.o. 3 times daily as needed for muscle spasms.  Please see After Visit Summary for patient specific instructions.  Future Appointments  Date Time Provider [place]  02/19/2017  8:00 AM [facility] LAB 1 [facility] None  02/19/2017  8:15 AM [facility] INJ NURSE [facility] None  02/19/2017  9:00 AM Ladell Pier, MD [facility] None  02/19/2017 10:00 AM [facility] E15 [facility] None  02/21/2017  2:15 PM [facility] INJ NURSE [facility] None  03/09/2017  8:20 AM Sueanne Margarita, MD [facility]  06/28/2017  9:15 AM Dennie Bible, NP [facility] None    No  orders of the defined types were placed in this encounter.      Subjective:   Patient ID:  Heather Frederick is a 61 y.o. (DOB May 05, 1955) female.  Chief Complaint:  Chief Complaint  Patient presents with  . Pain    new abdominal pain    HPI Heather Frederick is a 61 year old female with a diagnosis of a metastatic pancreatic cancer with metastatic disease to the liver.  She is status post cycle 1 of FOLFOX dose on 01/31/2017 with plans to complete 2 cycles of FOLFOX prior to potentially transitioning to FOLFIRINOX.  The patient has had a history of right back pain but reports that she had an increase in the severity of her back pain yesterday with her pain extending to her anterior abdomen.  She reports that she was having pain over most of her abdomen yesterday and describes the pain in her right upper quadrant is stabbing.  She does not like to use narcotics and has been avoiding them.  She has been using Tylenol for her pain but does report that she did take Vicodin yesterday and again today without complete resolution of her pain.  She is having nausea but no vomiting.  She has ongoing constipation for which she uses MiraLAX.  She reports that her constipation is managed when she uses her MiraLAX regularly.  She had some substernal chest discomfort which lasted briefly.  She has been followed by cardiologist and has had a recent Holter monitor study completed.  Medications: I have reviewed  the patient's current medications.  Allergies:  Allergies  Allergen Reactions  . Topamax [Topiramate] Other (See Comments)    :Stroke like symptoms  . Aleve [Naproxen Sodium] Hives    Has tolerated Voltaren topical as well as aspirin.  . Bee Venom Swelling  . Echinacea Hives  . Other Other (See Comments)    Feathers cause sinus congestion  . Sulfa Antibiotics Hives  . Advil [Ibuprofen] Hives    Has tolerated Voltaren topical as well as aspirin.    Past Medical History:  Diagnosis Date  .  Allergy   . Anxiety   . Arthritis   . Benign essential HTN 09/23/2014  . Cancer ([place])   . Chronic kidney disease    uti  . Depression   . Dyslipidemia   . Elevated liver enzymes   . Family history of anesthesia complication    father has a severe hard time waking up  . Gallstones    a. Seen on CT 01/2014.  Marland Kitchen GERD (gastroesophageal reflux disease)   . Hard of hearing   . Hepatic steatosis   . History of frequent urinary tract infections   . Hyperlipidemia   . Hypertension   . Lateral epicondylitis of right elbow   . Mental disorder   . Meralgia paresthetica of right side 10/02/2012   slight at 05/2014  . Migraine headache   . Obesity   . OSA (obstructive sleep apnea)    severe with AHI 37/hr now on CPAP at 18cm H2O  . Osteoarthritis   . Pneumonia    Feb 2018  . Raynaud disease    in feet per patient   . Sleep apnea    wears c-pap  . Urinary tract infection     Past Surgical History:  Procedure Laterality Date  . ABDOMINAL HYSTERECTOMY  1/04   partial  . BLADDER SUSPENSION  6/10  . CARDIAC CATHETERIZATION    . carpel tunnel left/right  7/08, 8/08 Bilateral 8/08 and 7/08  . carpel tunnel rel Right 4/12  . COLONOSCOPY    . IR CV LINE INJECTION  11/24/2016  . IR FLUORO GUIDE PORT INSERTION RIGHT  08/28/2016  . IR US GUIDE VASC ACCESS RIGHT  08/28/2016  . JOINT REPLACEMENT    . KNEE ARTHROSCOPY Left 12/12  . KNEE ARTHROSCOPY Right 12/06  . PLANTAR FASCIA RELEASE Right 12/10  . radial tunnel release     right arm   . ROTATOR CUFF REPAIR Left 6/11  . tennis elbow release Right 7/04    Family History  Problem Relation Age of Onset  . Hypertension Mother   . Stroke Mother   . Liver disease Mother        Abcess  . Hypertension Father   . Coronary artery disease Father   . Migraines Father   . Clotting disorder Father   . Kidney failure Brother   . Hypertension Brother   . Migraines Brother   . Migraines Daughter   . Breast cancer Other        Niece with breast  cancer  . Colon cancer Paternal Grandmother   . Pancreatic cancer Paternal Grandmother   . Stomach cancer Paternal Grandmother   . Breast cancer Cousin   . Esophageal cancer Neg Hx   . Rectal cancer Neg Hx     Social History   Socioeconomic History  . Marital status: Married    Spouse name: Remo Lipps  . Number of children: 2  . Years of education: 75  .  Highest education level: Not on file  Social Needs  . Financial resource strain: Not on file  . Food insecurity - worry: Not on file  . Food insecurity - inability: Not on file  . Transportation needs - medical: Not on file  . Transportation needs - non-medical: Not on file  Occupational History  . Occupation: Geneticist, molecular: Psychologist, sport and exercise [employer]  Tobacco Use  . Smoking status: Never Smoker  . Smokeless tobacco: Never Used  . Tobacco comment: Secondhand - from family growing up, workplace intermittently  Substance and Sexual Activity  . Alcohol use: Yes    Alcohol/week: 0.0 oz    Comment: occasionally - intermittent, no more than twice a week  . Drug use: No  . Sexual activity: Not on file  Other Topics Concern  . Not on file  Social History Narrative   Patient is married Remo Lipps) and lives at home with her husband and child.   Patient has two children.   Patient has a college education.   Patient is right-handed.   Patient drinks 1-2 cup of coffee/tea daily.    Past Medical History, Surgical history, Social history, and Family history were reviewed and updated as appropriate.   Please see review of systems for further details on the patient's review from today.   Review of Systems:  Review of Systems  Constitutional: Negative for chills, diaphoresis and fever.  HENT: Negative for trouble swallowing.   Respiratory: Negative for cough, chest tightness and shortness of breath.   Cardiovascular: Positive for chest pain. Negative for palpitations.  Gastrointestinal: Positive for abdominal pain, constipation and  nausea. Negative for diarrhea and vomiting.  Musculoskeletal: Positive for back pain and myalgias.    Objective:   Physical Exam:  BP 120/66 (BP Location: Left Arm, Patient Position: Sitting)   Pulse 70   Temp 98 F (36.7 C) (Oral)   Resp 18   Ht 5\' 3"  (1.6 m)   Wt 275 lb 11.2 oz (125.1 kg)   SpO2 98%   BMI 48.84 kg/m  ECOG: 1   Physical Exam  Constitutional: No distress.  HENT:  Head: Normocephalic and atraumatic.  Right Ear: External ear normal.  Left Ear: External ear normal.  Mouth/Throat: Oropharynx is clear and moist. No oropharyngeal exudate.  Eyes: Right eye exhibits no discharge. Left eye exhibits no discharge.  Neck: Normal range of motion. Neck supple.  Cardiovascular: Normal rate, regular rhythm and normal heart sounds. Exam reveals no gallop and no friction rub.  No murmur heard. Pulmonary/Chest: Effort normal and breath sounds normal. No respiratory distress. She has no wheezes. She has no rales.  Abdominal: Soft. Bowel sounds are normal. She exhibits no mass. There is no tenderness. There is no rebound and no guarding.  Musculoskeletal: She exhibits tenderness.       Arms: Lymphadenopathy:    She has no cervical adenopathy.  Neurological: She is alert.  Skin: Skin is warm and dry. She is not diaphoretic.    Lab Review:     Component Value Date/Time   NA 138 01/31/2017 0919   K 4.0 01/31/2017 0919   CL 104 08/07/2016 1037   CO2 24 01/31/2017 0919   GLUCOSE 230 (H) 01/31/2017 0919   BUN 8.6 01/31/2017 0919   CREATININE 0.8 01/31/2017 0919   CALCIUM 9.2 01/31/2017 0919   PROT 6.8 01/31/2017 0919   ALBUMIN 3.2 (L) 01/31/2017 0919   AST 20 01/31/2017 0919   ALT 19 01/31/2017 0919   ALKPHOS  129 01/31/2017 0919   BILITOT 0.47 01/31/2017 0919   GFRNONAA >60 06/27/2016 1030   GFRAA >60 06/27/2016 1030       Component Value Date/Time   WBC 6.4 01/31/2017 0920   WBC 7.7 08/28/2016 0823   RBC 3.76 01/31/2017 0920   RBC 4.59 08/28/2016 0823   HGB  11.0 (L) 01/31/2017 0920   HCT 34.0 (L) 01/31/2017 0920   PLT 228 01/31/2017 0920   MCV 90.4 01/31/2017 0920   MCH 29.4 01/31/2017 0920   MCH 29.2 08/28/2016 0823   MCHC 32.5 01/31/2017 0920   MCHC 33.3 08/28/2016 0823   RDW 15.8 (H) 01/31/2017 0920   LYMPHSABS 1.2 01/31/2017 0920   MONOABS 0.9 01/31/2017 0920   EOSABS 0.3 01/31/2017 0920   BASOSABS 0.1 01/31/2017 0920   -------------------------------  Imaging from last 24 hours (if applicable):  Radiology interpretation: No results found.      This case was discussed with Dr. Benay Spice. He expressed agreement with my management of this patient.

## 2017-02-05 ENCOUNTER — Observation Stay (HOSPITAL_COMMUNITY)
Admission: AD | Admit: 2017-02-05 | Discharge: 2017-02-06 | Disposition: A | Discharge status: Home / Self Care | Payer: 59 | Source: Ambulatory Visit | Attending: Internal Medicine | Admitting: Internal Medicine

## 2017-02-05 ENCOUNTER — Observation Stay (HOSPITAL_COMMUNITY): Payer: 59

## 2017-02-05 ENCOUNTER — Other Ambulatory Visit: Payer: Self-pay

## 2017-02-05 ENCOUNTER — Telehealth: Payer: Self-pay | Admitting: *Deleted

## 2017-02-05 ENCOUNTER — Encounter (HOSPITAL_COMMUNITY): Payer: Self-pay

## 2017-02-05 DIAGNOSIS — R059 Cough, unspecified: Secondary | ICD-10-CM

## 2017-02-05 DIAGNOSIS — R42 Dizziness and giddiness: Secondary | ICD-10-CM

## 2017-02-05 DIAGNOSIS — F329 Major depressive disorder, single episode, unspecified: Secondary | ICD-10-CM | POA: Diagnosis not present

## 2017-02-05 DIAGNOSIS — F419 Anxiety disorder, unspecified: Secondary | ICD-10-CM | POA: Diagnosis not present

## 2017-02-05 DIAGNOSIS — G4733 Obstructive sleep apnea (adult) (pediatric): Secondary | ICD-10-CM | POA: Insufficient documentation

## 2017-02-05 DIAGNOSIS — Z794 Long term (current) use of insulin: Secondary | ICD-10-CM | POA: Insufficient documentation

## 2017-02-05 DIAGNOSIS — Z9221 Personal history of antineoplastic chemotherapy: Secondary | ICD-10-CM | POA: Diagnosis not present

## 2017-02-05 DIAGNOSIS — E669 Obesity, unspecified: Secondary | ICD-10-CM | POA: Diagnosis not present

## 2017-02-05 DIAGNOSIS — E1122 Type 2 diabetes mellitus with diabetic chronic kidney disease: Secondary | ICD-10-CM | POA: Diagnosis not present

## 2017-02-05 DIAGNOSIS — Z7901 Long term (current) use of anticoagulants: Secondary | ICD-10-CM | POA: Insufficient documentation

## 2017-02-05 DIAGNOSIS — I7300 Raynaud's syndrome without gangrene: Secondary | ICD-10-CM | POA: Diagnosis not present

## 2017-02-05 DIAGNOSIS — Z79899 Other long term (current) drug therapy: Secondary | ICD-10-CM | POA: Diagnosis not present

## 2017-02-05 DIAGNOSIS — E860 Dehydration: Principal | ICD-10-CM | POA: Insufficient documentation

## 2017-02-05 DIAGNOSIS — Z7982 Long term (current) use of aspirin: Secondary | ICD-10-CM | POA: Diagnosis not present

## 2017-02-05 DIAGNOSIS — E785 Hyperlipidemia, unspecified: Secondary | ICD-10-CM | POA: Diagnosis not present

## 2017-02-05 DIAGNOSIS — G43909 Migraine, unspecified, not intractable, without status migrainosus: Secondary | ICD-10-CM | POA: Diagnosis not present

## 2017-02-05 DIAGNOSIS — Z87440 Personal history of urinary (tract) infections: Secondary | ICD-10-CM | POA: Diagnosis not present

## 2017-02-05 DIAGNOSIS — K219 Gastro-esophageal reflux disease without esophagitis: Secondary | ICD-10-CM | POA: Diagnosis not present

## 2017-02-05 DIAGNOSIS — R05 Cough: Secondary | ICD-10-CM

## 2017-02-05 DIAGNOSIS — R112 Nausea with vomiting, unspecified: Secondary | ICD-10-CM

## 2017-02-05 DIAGNOSIS — I129 Hypertensive chronic kidney disease with stage 1 through stage 4 chronic kidney disease, or unspecified chronic kidney disease: Secondary | ICD-10-CM | POA: Insufficient documentation

## 2017-02-05 DIAGNOSIS — M1990 Unspecified osteoarthritis, unspecified site: Secondary | ICD-10-CM | POA: Diagnosis not present

## 2017-02-05 DIAGNOSIS — C259 Malignant neoplasm of pancreas, unspecified: Secondary | ICD-10-CM | POA: Insufficient documentation

## 2017-02-05 DIAGNOSIS — N189 Chronic kidney disease, unspecified: Secondary | ICD-10-CM | POA: Insufficient documentation

## 2017-02-05 LAB — COMPREHENSIVE METABOLIC PANEL
ALBUMIN: 3.3 g/dL — AB (ref 3.5–5.0)
ALK PHOS: 147 U/L — AB (ref 38–126)
ALT: 25 U/L (ref 14–54)
ANION GAP: 10 (ref 5–15)
AST: 20 U/L (ref 15–41)
BILIRUBIN TOTAL: 1.2 mg/dL (ref 0.3–1.2)
BUN: 17 mg/dL (ref 6–20)
CALCIUM: 9.3 mg/dL (ref 8.9–10.3)
CO2: 25 mmol/L (ref 22–32)
CREATININE: 0.97 mg/dL (ref 0.44–1.00)
Chloride: 100 mmol/L — ABNORMAL LOW (ref 101–111)
GFR calc Af Amer: >60 mL/min (ref >60)
GFR calc non Af Amer: >60 mL/min (ref >60)
GLUCOSE: 196 mg/dL — AB (ref 65–99)
Potassium: 4 mmol/L (ref 3.5–5.1)
Sodium: 135 mmol/L (ref 135–145)
Total Protein: 7 g/dL (ref 6.5–8.1)

## 2017-02-05 LAB — CBC
HCT: 38.1 % (ref 36.0–46.0)
HEMOGLOBIN: 12.7 g/dL (ref 12.0–15.0)
MCH: 29.7 pg (ref 26.0–34.0)
MCHC: 33.3 g/dL (ref 30.0–36.0)
MCV: 89 fL (ref 78.0–100.0)
Platelets: 171 10*3/uL (ref 150–400)
RBC: 4.28 MIL/uL (ref 3.87–5.11)
RDW: 14.5 % (ref 11.5–15.5)
WBC: 7.3 10*3/uL (ref 4.0–10.5)

## 2017-02-05 LAB — GLUCOSE, CAPILLARY
GLUCOSE-CAPILLARY: 173 mg/dL — AB (ref 65–99)
Glucose-Capillary: 212 mg/dL — ABNORMAL HIGH (ref 65–99)
Glucose-Capillary: 249 mg/dL — ABNORMAL HIGH (ref 65–99)

## 2017-02-05 LAB — URINALYSIS, ROUTINE W REFLEX MICROSCOPIC
Bacteria, UA: NONE SEEN
Bilirubin Urine: NEGATIVE
GLUCOSE, UA: NEGATIVE mg/dL
Hgb urine dipstick: NEGATIVE
Ketones, ur: 5 mg/dL — AB
Leukocytes, UA: NEGATIVE
Nitrite: NEGATIVE
PROTEIN: 100 mg/dL — AB
Specific Gravity, Urine: 1.017 (ref 1.005–1.030)
pH: 8 (ref 5.0–8.0)

## 2017-02-05 LAB — TROPONIN I: Troponin I: <0.03 ng/mL (ref <0.03)

## 2017-02-05 LAB — LIPASE, BLOOD: LIPASE: 20 U/L (ref 11–51)

## 2017-02-05 LAB — D-DIMER, QUANTITATIVE (NOT AT ARMC): D DIMER QUANT: 0.63 ug{FEU}/mL — AB (ref 0.00–0.50)

## 2017-02-05 MED ORDER — PREGABALIN 75 MG PO CAPS
200.0000 mg | ORAL_CAPSULE | Freq: Every day | ORAL | Status: DC
  Administered 2017-02-05 – 2017-02-06 (×2): 200 mg via ORAL
  Filled 2017-02-05 (×2): qty 2

## 2017-02-05 MED ORDER — INSULIN ASPART 100 UNIT/ML ~~LOC~~ SOLN
0.0000 [IU] | Freq: Three times a day (TID) | SUBCUTANEOUS | Status: DC
  Administered 2017-02-06: 5 [IU] via SUBCUTANEOUS
  Administered 2017-02-06: 3 [IU] via SUBCUTANEOUS

## 2017-02-05 MED ORDER — LORAZEPAM 2 MG/ML IJ SOLN
0.5000 mg | Freq: Three times a day (TID) | INTRAMUSCULAR | Status: DC | PRN
  Administered 2017-02-05: 0.5 mg via INTRAVENOUS
  Filled 2017-02-05: qty 1

## 2017-02-05 MED ORDER — ACETAMINOPHEN 325 MG PO TABS: 650.0000 mg | ORAL_TABLET | Freq: Four times a day (QID) | ORAL | Status: DC | PRN

## 2017-02-05 MED ORDER — ENOXAPARIN SODIUM 60 MG/0.6ML ~~LOC~~ SOLN: 60.0000 mg | SUBCUTANEOUS | Status: DC

## 2017-02-05 MED ORDER — RIVAROXABAN 20 MG PO TABS
20.0000 mg | ORAL_TABLET | Freq: Every day | ORAL | Status: DC
  Administered 2017-02-05: 20 mg via ORAL
  Filled 2017-02-05: qty 1

## 2017-02-05 MED ORDER — PROMETHAZINE HCL 25 MG/ML IJ SOLN: 12.5000 mg | Freq: Four times a day (QID) | INTRAMUSCULAR | Status: DC | PRN

## 2017-02-05 MED ORDER — SODIUM CHLORIDE 0.9% FLUSH
10.0000 mL | INTRAVENOUS | Status: DC | PRN
  Administered 2017-02-06: 10 mL
  Filled 2017-02-05: qty 40

## 2017-02-05 MED ORDER — CITALOPRAM HYDROBROMIDE 20 MG PO TABS
20.0000 mg | ORAL_TABLET | Freq: Every day | ORAL | Status: DC
  Administered 2017-02-05 – 2017-02-06 (×2): 20 mg via ORAL
  Filled 2017-02-05 (×2): qty 1

## 2017-02-05 MED ORDER — SODIUM CHLORIDE 0.9% FLUSH
10.0000 mL | INTRAVENOUS | Status: DC | PRN
Start: 2017-02-05 — End: 2017-02-05

## 2017-02-05 MED ORDER — CYCLOBENZAPRINE HCL 5 MG PO TABS
5.0000 mg | ORAL_TABLET | Freq: Three times a day (TID) | ORAL | Status: DC | PRN
  Administered 2017-02-06: 5 mg via ORAL
  Filled 2017-02-05: qty 1

## 2017-02-05 MED ORDER — DIPHENHYDRAMINE HCL 50 MG/ML IJ SOLN: 25.0000 mg | Freq: Four times a day (QID) | INTRAMUSCULAR | Status: DC | PRN

## 2017-02-05 MED ORDER — PROMETHAZINE HCL 25 MG/ML IJ SOLN
12.5000 mg | Freq: Four times a day (QID) | INTRAMUSCULAR | Status: DC | PRN
Start: 2017-02-05 — End: 2017-02-06

## 2017-02-05 MED ORDER — ONDANSETRON HCL 4 MG/2ML IJ SOLN: 4.0000 mg | Freq: Four times a day (QID) | INTRAMUSCULAR | Status: DC | PRN

## 2017-02-05 MED ORDER — SODIUM CHLORIDE 0.9 % IV SOLN
INTRAVENOUS | Status: DC
  Administered 2017-02-05 – 2017-02-06 (×2): via INTRAVENOUS

## 2017-02-05 MED ORDER — MORPHINE SULFATE (PF) 4 MG/ML IV SOLN
1.0000 mg | INTRAVENOUS | Status: DC | PRN
  Administered 2017-02-05 – 2017-02-06 (×3): 1 mg via INTRAVENOUS
  Filled 2017-02-05 (×3): qty 1

## 2017-02-05 MED ORDER — ACETAMINOPHEN 650 MG RE SUPP: 650.0000 mg | Freq: Four times a day (QID) | RECTAL | Status: DC | PRN

## 2017-02-05 MED ORDER — SODIUM CHLORIDE 0.9% FLUSH: 10.0000 mL | Freq: Two times a day (BID) | INTRAVENOUS | Status: DC

## 2017-02-05 MED ORDER — NYSTATIN 100000 UNIT/ML MT SUSP
5.0000 mL | Freq: Four times a day (QID) | OROMUCOSAL | Status: DC
  Administered 2017-02-05 – 2017-02-06 (×3): 500000 [IU] via ORAL
  Filled 2017-02-05 (×3): qty 5

## 2017-02-05 MED ORDER — PANTOPRAZOLE SODIUM 40 MG PO TBEC
40.0000 mg | DELAYED_RELEASE_TABLET | Freq: Two times a day (BID) | ORAL | Status: DC
  Administered 2017-02-05 – 2017-02-06 (×2): 40 mg via ORAL
  Filled 2017-02-05 (×2): qty 1

## 2017-02-05 MED ORDER — LORAZEPAM 0.5 MG PO TABS
0.5000 mg | ORAL_TABLET | Freq: Three times a day (TID) | ORAL | Status: DC | PRN
  Administered 2017-02-05: 0.5 mg via ORAL
  Filled 2017-02-05: qty 1

## 2017-02-05 MED ORDER — GADOBENATE DIMEGLUMINE 529 MG/ML IV SOLN
20.0000 mL | Freq: Once | INTRAVENOUS | Status: AC | PRN
  Administered 2017-02-05: 20 mL via INTRAVENOUS

## 2017-02-05 MED ORDER — ONDANSETRON 4 MG PO TBDP: 8.0000 mg | ORAL_TABLET | Freq: Three times a day (TID) | ORAL | Status: DC | PRN

## 2017-02-05 MED ORDER — CEFTRIAXONE SODIUM 2 G IJ SOLR
2.0000 g | INTRAMUSCULAR | Status: DC
  Administered 2017-02-05: 2 g via INTRAVENOUS
  Filled 2017-02-05 (×2): qty 2

## 2017-02-05 MED ORDER — ORAL CARE MOUTH RINSE
15.0000 mL | Freq: Two times a day (BID) | OROMUCOSAL | Status: DC
  Administered 2017-02-05 – 2017-02-06 (×2): 15 mL via OROMUCOSAL

## 2017-02-05 MED ORDER — LORATADINE 10 MG PO TABS
10.0000 mg | ORAL_TABLET | Freq: Every day | ORAL | Status: DC
  Administered 2017-02-05 – 2017-02-06 (×2): 10 mg via ORAL
  Filled 2017-02-05 (×2): qty 1

## 2017-02-05 MED ORDER — CHLORHEXIDINE GLUCONATE 0.12 % MT SOLN
15.0000 mL | Freq: Two times a day (BID) | OROMUCOSAL | Status: DC
  Administered 2017-02-05 – 2017-02-06 (×2): 15 mL via OROMUCOSAL
  Filled 2017-02-05 (×2): qty 15

## 2017-02-05 MED ORDER — POLYETHYLENE GLYCOL 3350 17 G PO PACK
17.0000 g | PACK | Freq: Every day | ORAL | Status: DC
  Filled 2017-02-05: qty 1

## 2017-02-05 MED ORDER — OXYCODONE HCL 5 MG PO TABS
5.0000 mg | ORAL_TABLET | ORAL | Status: DC | PRN
  Administered 2017-02-05: 5 mg via ORAL
  Filled 2017-02-05: qty 1

## 2017-02-05 NOTE — Telephone Encounter (Signed)
"  Remo Lipps calling to notify Dr. Benay Spice if he doesn't already know that Heather Frederick is admitted to [facility].  She was weak, dizzy, dehydrated.  Her blood sugars may be abnormal also."

## 2017-02-05 NOTE — H&P (Signed)
H&P   Patient Demographics:    Heather Frederick, is a 61 y.o. female  MRN: [id number]   DOB - [birth year]  Admit Date - 02/05/2017  Outpatient Primary MD for the patient is [name], MD  Referring MD/NP/PA:    Outpatient Specialists:  Kavin Leech   Patient coming from: home=> office  No chief complaint on file. Dizziness    HPI:    Heather Frederick  is a 61 y.o. female, w pancreatic cancer (metastatic), c/o nausea since her last chemo on Wednesday, and also w drying heaving. Pt has had very poor po intake since last Wednesday when new chemo was initiated.  Felt terribly and presented to office for evaluation.  Slight sob for the past  1 day.  Pt denies fever, chills, cp, palp, diarrhea, brbpr, black stool.  Pt has chronic abdominal pain.  Dizziness x5 days,  Worse today and also yesterday.  No vertigo, felt really generally weak.  Pt will be admitted for dizziness r/o metastatic disease to brain and nausea and inability to eat, and n/v.      Review of systems:    In addition to the HPI above,  No Headache, No changes with Vision or hearing,  + diziness No problems swallowing food or Liquids, No Chest pain, Cough or Shortness of Breath, + chronic abdominal pain No Blood in stool or Urine, No dysuria, No new skin rashes or bruises, No new joints pains-aches,  No new weakness, tingling, numbness in any extremity, No recent weight gain or loss, No polyuria, polydypsia or polyphagia, No significant Mental Stressors.  A full 10 point Review of Systems was done, except as stated above, all other Review of Systems were negative.   With Past History of the following :    Past Medical History:  Diagnosis Date  . Allergy   . Anxiety   . Arthritis   . Benign essential HTN 09/23/2014  . Cancer ([place])   . Chronic kidney disease    uti  . Depression   . Dyslipidemia   . Elevated  liver enzymes   . Family history of anesthesia complication    father has a severe hard time waking up  . Gallstones    a. Seen on CT 01/2014.  Marland Kitchen GERD (gastroesophageal reflux disease)   . Hard of hearing   . Hepatic steatosis   . History of frequent urinary tract infections   . Hyperlipidemia   . Hypertension   . Lateral epicondylitis of right elbow   . Mental disorder   . Meralgia paresthetica of right side 10/02/2012   slight at 05/2014  . Migraine headache   . Obesity   . OSA (obstructive sleep apnea)    severe with AHI 37/hr now on CPAP at 18cm H2O  . Osteoarthritis   . Pneumonia    Feb 2018  . Raynaud disease    in feet per patient   . Sleep  apnea    wears c-pap  . Urinary tract infection       Past Surgical History:  Procedure Laterality Date  . ABDOMINAL HYSTERECTOMY  1/04   partial  . BLADDER SUSPENSION  6/10  . CARDIAC CATHETERIZATION    . carpel tunnel left/right  7/08, 8/08 Bilateral 8/08 and 7/08  . carpel tunnel rel Right 4/12  . COLONOSCOPY    . IR CV LINE INJECTION  11/24/2016  . IR FLUORO GUIDE PORT INSERTION RIGHT  08/28/2016  . IR US GUIDE VASC ACCESS RIGHT  08/28/2016  . JOINT REPLACEMENT    . KNEE ARTHROSCOPY Left 12/12  . KNEE ARTHROSCOPY Right 12/06  . PLANTAR FASCIA RELEASE Right 12/10  . radial tunnel release     right arm   . ROTATOR CUFF REPAIR Left 6/11  . tennis elbow release Right 7/04      Social History:     Social History   Tobacco Use  . Smoking status: Never Smoker  . Smokeless tobacco: Never Used  . Tobacco comment: Secondhand - from family growing up, workplace intermittently  Substance Use Topics  . Alcohol use: Yes    Alcohol/week: 0.0 oz    Comment: occasionally - intermittent, no more than twice a week     Lives -  At home  Mobility -  Walks by self up til the past 2 days.    Family History :     Family History  Problem Relation Age of Onset  . Hypertension Mother   . Stroke Mother   . Liver disease  Mother        Abcess  . Hypertension Father   . Coronary artery disease Father   . Migraines Father   . Clotting disorder Father   . Kidney failure Brother   . Hypertension Brother   . Migraines Brother   . Migraines Daughter   . Breast cancer Other        Niece with breast cancer  . Colon cancer Paternal Grandmother   . Pancreatic cancer Paternal Grandmother   . Stomach cancer Paternal Grandmother   . Breast cancer Cousin   . Esophageal cancer Neg Hx   . Rectal cancer Neg Hx       Home Medications:   Prior to Admission medications   Medication Sig Start Date End Date Taking? Authorizing Provider  acetaminophen (TYLENOL) 650 MG CR tablet Take 1,300 mg by mouth every 8 (eight) hours as needed for pain.   Yes [provider]  aspirin EC 81 MG tablet Take 81 mg by mouth daily.   Yes [provider]  citalopram (CELEXA) 20 MG tablet TAKE ONE (1) TABLET BY MOUTH EVERY DAY 01/22/17  Yes Hoyt Koch, MD  Cranberry-Vitamin C-Vitamin E (CRANBERRY PLUS VITAMIN C PO) Take 1 tablet by mouth daily. 4200 mg   Yes [provider]  cyclobenzaprine (FLEXERIL) 5 MG tablet Take 1 tablet (5 mg total) 3 (three) times daily as needed by mouth for muscle spasms. 02/02/17  Yes Tanner, Lyndon Code., PA-C  eletriptan (RELPAX) 40 MG tablet Take 1 tablet (40 mg total) by mouth every 2 (two) hours as needed for migraine. 06/26/16  Yes Dennie Bible, NP  insulin detemir (LEVEMIR) 100 UNIT/ML injection Inject 7 Units at bedtime into the skin.   Yes [provider]  insulin lispro (HUMALOG) 100 UNIT/ML injection Inject into the skin 3 (three) times daily before meals. Only takes when glucose is "very high"  Yes [provider]  lidocaine-prilocaine (EMLA) cream Apply 1 application topically as needed. Apply to [facility] a Cath site one hour prior to needle stick. 08/29/16  Yes Ladell Pier, MD  loratadine (CLARITIN) 10 MG tablet Take 10-20 mg by mouth 2 (two)  times daily as needed for allergies.    Yes [provider]  LORazepam (ATIVAN) 0.5 MG tablet Take 1 tablet (0.5 mg total) by mouth every 8 (eight) hours as needed (for nausea). 01/03/17  Yes Owens Shark, NP  LYRICA 200 MG capsule TAKE ONE (1) CAPSULE BY MOUTH EVERY MORNING 12/18/16  Yes Dennie Bible, NP  nitrofurantoin (MACRODANTIN) 100 MG capsule Take 100 mg by mouth daily.  07/23/15  Yes [provider]  nitroGLYCERIN (NITROSTAT) 0.4 MG SL tablet Place 1 tablet (0.4 mg total) under the tongue every 5 (five) minutes as needed for chest pain. 06/28/15  Yes Belva Crome, MD  oxybutynin (DITROPAN-XL) 10 MG 24 hr tablet Take 10 mg by mouth every morning.  05/15/13  Yes [provider]  oxyCODONE (OXY IR/ROXICODONE) 5 MG immediate release tablet Take 1 tablet (5 mg total) every 4 (four) hours as needed by mouth for severe pain. 02/02/17  Yes Tanner, Lucianne Lei E., PA-C  pantoprazole (PROTONIX) 40 MG tablet TAKE ONE (1) TABLET BY MOUTH TWO (2) TIMES DAILY 09/25/16  Yes Ladene Artist, MD  polyethylene glycol (MIRALAX / GLYCOLAX) packet Take 17 g by mouth daily.   Yes [provider]  ranitidine (ZANTAC) 300 MG tablet TAKE ONE TABLET BY MOUTH EVERY EVENING 01/20/16  Yes Ladene Artist, MD  XARELTO 20 MG TABS tablet Take 20 mg daily by mouth. 01/31/17  Yes [provider]  ondansetron (ZOFRAN-ODT) 8 MG disintegrating tablet Take 1 tablet (8 mg total) by mouth every 8 (eight) hours as needed for nausea or vomiting. Patient not taking: Reported on 02/05/2017 09/18/16   Maryanna Shape, NP     Allergies:     Allergies  Allergen Reactions  . Topamax [Topiramate] Other (See Comments)    :Stroke like symptoms  . Aleve [Naproxen Sodium] Hives    Has tolerated Voltaren topical as well as aspirin.  . Bee Venom Swelling  . Echinacea Hives  . Other Other (See Comments)    Feathers cause sinus congestion  . Sulfa Antibiotics Hives  . Advil [Ibuprofen] Hives     Has tolerated Voltaren topical as well as aspirin.     Physical Exam:   Vitals  Blood pressure (!) 104/39, pulse 96, temperature (!) 97.4 F (36.3 C), temperature source Oral, resp. rate 20, SpO2 96 %.   1. General  lying in bed in NAD,   2. Normal affect and insight, Not Suicidal or Homicidal, Awake Alert, Oriented X 3.  3. No F.N deficits, ALL C.Nerves Intact, Strength 5/5 all 4 extremities, Sensation intact all 4 extremities, Plantars down going.  4. Ears and Eyes appear Normal, Conjunctivae clear, PERRLA. Moist Oral Mucosa.  5. Supple Neck, No JVD, No cervical lymphadenopathy appriciated, No Carotid Bruits.  6. Symmetrical Chest wall movement, Good air movement bilaterally, CTAB.  7.  rrr s1, s2, no m/g/r     8.  Obese,  Positive Bowel Sounds, Abdomen Soft, No tenderness, No organomegaly appriciated,No rebound -guarding or rigidity.  9.  No Cyanosis, Normal Skin Turgor, No Skin Rash or Bruise.  10. Good muscle tone,  joints appear normal , no effusions, Normal ROM.  11. No Palpable Lymph Nodes in Neck  or Axillae  portacath present   Good finger to nose   Data Review:    CBC Recent Labs  Lab 01/31/17 0920 02/05/17 1556  WBC 6.4 7.3  HGB 11.0* 12.7  HCT 34.0* 38.1  PLT 228 171  MCV 90.4 89.0  MCH 29.4 29.7  MCHC 32.5 33.3  RDW 15.8* 14.5  LYMPHSABS 1.2  --   MONOABS 0.9  --   EOSABS 0.3  --   BASOSABS 0.1  --    ------------------------------------------------------------------------------------------------------------------  Chemistries  Recent Labs  Lab 01/31/17 0919 02/05/17 1556  NA 138 135  K 4.0 4.0  CL  --  100*  CO2 24 25  GLUCOSE 230* 196*  BUN 8.6 17  CREATININE 0.8 0.97  CALCIUM 9.2 9.3  AST 20 20  ALT 19 25  ALKPHOS 129 147*  BILITOT 0.47 1.2   ------------------------------------------------------------------------------------------------------------------ estimated creatinine clearance is 80.3 mL/min (by C-G formula  based on SCr of 0.97 mg/dL). ------------------------------------------------------------------------------------------------------------------ No results for input(s): TSH, T4TOTAL, T3FREE, THYROIDAB in the last 72 hours.  Invalid input(s): FREET3  Coagulation profile No results for input(s): INR, PROTIME in the last 168 hours. ------------------------------------------------------------------------------------------------------------------- Recent Labs    02/05/17 1556  DDIMER 0.63*   -------------------------------------------------------------------------------------------------------------------  Cardiac Enzymes Recent Labs  Lab 02/05/17 1556  TROPONINI <0.03   ------------------------------------------------------------------------------------------------------------------ No results found for: BNP   ---------------------------------------------------------------------------------------------------------------  Urinalysis    Component Value Date/Time   COLORURINE YELLOW 05/22/2016 [place] 05/22/2016 1116   LABSPEC 1.015 05/22/2016 1116   PHURINE 5.5 05/22/2016 1116   GLUCOSEU NEGATIVE 05/22/2016 1116   HGBUR MODERATE (A) 05/22/2016 1116   BILIRUBINUR negatvie 07/07/2016 0912   KETONESUR NEGATIVE 05/22/2016 1116   PROTEINUR negative 07/07/2016 0912   PROTEINUR NEGATIVE 05/06/2016 1636   UROBILINOGEN 0.2 07/07/2016 0912   UROBILINOGEN 0.2 05/22/2016 1116   NITRITE negative 07/07/2016 0912   NITRITE NEGATIVE 05/22/2016 1116   LEUKOCYTESUR Trace (A) 07/07/2016 0912    ----------------------------------------------------------------------------------------------------------------   Imaging Results:    Dg Chest 2 View  Result Date: 02/05/2017 CLINICAL DATA:  Cough with nausea and vomiting. Pancreatic carcinoma EXAM: CHEST  2 VIEW COMPARISON:  Chest CT 12/06/2016.  Chest x-ray 06/26/2016 FINDINGS: Heart size and vascularity normal. Negative for  pneumonia. Left lower lobe scar/ atelectasis. Negative for pleural effusion 8 mm right middle lobe nodule as noted on CT. Possible metastatic disease. No other lung nodule. Port-A-Cath tip in the SVC IMPRESSION: 8 mm right middle lobe nodule unchanged from recent CT. Possible metastatic disease. Electronically Signed   By: Franchot Gallo M.D.   On: 02/05/2017 17:02      Assessment & Plan:    Active Problems:   Dizziness    Dizziness MRI brain with and without contrast r/o metastatic disease (pancreatic cancer) Consider carotid u/s, cardiac echo if persistent.   Nausea , dry heave Zofran, phenergan Continue protonix  Dehydration Ns iv  Tachycardia in office hr 104 ? Related to pain D dimer checked but pt state has been compliant with xarelto Low likelihood of PE,  If shows signs of dyspnea will consider CTA chest Cont xarelto   Abdominal pain Secondary to pancreatic cancer Morphine 1mg  iv q4h prn Cont oxycodone  Benadryl prn itching  Dm2 fsbs q4h , ISS Continue long acting insuin  Abnormal liver function Likely secondary to pancreatic cancer  Metastatic pancreatic cancer to liver, lung F/u with oncology as outpatient    DVT Prophylaxis Xarelto   SCDs  AM Labs Ordered, also  please review Full Orders  Family Communication: Admission, patients condition and plan of care including tests being ordered have been discussed with the patient husband  who indicate understanding and agree with the plan and Code Status.  Code Status FULL CODE  Likely DC to  home  Condition GUARDED   Consults called: none  Admission status: observation   Time spent in minutes : 45    [name] M.D on 02/05/2017 at 5:39 PM  Between 7am to 7am - Pager - [phone]

## 2017-02-06 DIAGNOSIS — E860 Dehydration: Secondary | ICD-10-CM | POA: Diagnosis not present

## 2017-02-06 LAB — COMPREHENSIVE METABOLIC PANEL
ALBUMIN: 3.1 g/dL — AB (ref 3.5–5.0)
ALT: 21 U/L (ref 14–54)
ANION GAP: 9 (ref 5–15)
AST: 15 U/L (ref 15–41)
Alkaline Phosphatase: 134 U/L — ABNORMAL HIGH (ref 38–126)
BUN: 18 mg/dL (ref 6–20)
CHLORIDE: 99 mmol/L — AB (ref 101–111)
CO2: 26 mmol/L (ref 22–32)
Calcium: 8.9 mg/dL (ref 8.9–10.3)
Creatinine, Ser: 0.92 mg/dL (ref 0.44–1.00)
GFR calc Af Amer: >60 mL/min (ref >60)
GFR calc non Af Amer: >60 mL/min (ref >60)
GLUCOSE: 201 mg/dL — AB (ref 65–99)
POTASSIUM: 3.9 mmol/L (ref 3.5–5.1)
SODIUM: 134 mmol/L — AB (ref 135–145)
TOTAL PROTEIN: 6.9 g/dL (ref 6.5–8.1)
Total Bilirubin: 0.5 mg/dL (ref 0.3–1.2)

## 2017-02-06 LAB — CBC
HEMATOCRIT: 36.5 % (ref 36.0–46.0)
Hemoglobin: 11.9 g/dL — ABNORMAL LOW (ref 12.0–15.0)
MCH: 29.4 pg (ref 26.0–34.0)
MCHC: 32.6 g/dL (ref 30.0–36.0)
MCV: 90.1 fL (ref 78.0–100.0)
PLATELETS: 163 10*3/uL (ref 150–400)
RBC: 4.05 MIL/uL (ref 3.87–5.11)
RDW: 14.8 % (ref 11.5–15.5)
WBC: 9.3 10*3/uL (ref 4.0–10.5)

## 2017-02-06 LAB — GLUCOSE, CAPILLARY
GLUCOSE-CAPILLARY: 197 mg/dL — AB (ref 65–99)
Glucose-Capillary: 207 mg/dL — ABNORMAL HIGH (ref 65–99)
Glucose-Capillary: 299 mg/dL — ABNORMAL HIGH (ref 65–99)

## 2017-02-06 LAB — HIV ANTIBODY (ROUTINE TESTING W REFLEX): HIV SCREEN 4TH GENERATION: NONREACTIVE

## 2017-02-06 MED ORDER — HEPARIN SOD (PORK) LOCK FLUSH 100 UNIT/ML IV SOLN
500.0000 [IU] | INTRAVENOUS | Status: AC | PRN
  Administered 2017-02-06: 500 [IU]

## 2017-02-06 MED ORDER — NYSTATIN 100000 UNIT/ML MT SUSP: 5.0000 mL | Freq: Four times a day (QID) | OROMUCOSAL | 0 refills | Status: DC

## 2017-02-06 MED ORDER — CEPHALEXIN 500 MG PO CAPS: 500.0000 mg | ORAL_CAPSULE | Freq: Two times a day (BID) | ORAL | 0 refills | Status: AC

## 2017-02-06 MED ORDER — INSULIN ASPART 100 UNIT/ML ~~LOC~~ SOLN: 0.0000 [IU] | Freq: Three times a day (TID) | SUBCUTANEOUS | 11 refills | Status: DC

## 2017-02-06 MED ORDER — INSULIN DETEMIR 100 UNIT/ML ~~LOC~~ SOLN: 10.0000 [IU] | Freq: Every day | SUBCUTANEOUS | 11 refills | Status: DC

## 2017-02-06 NOTE — Discharge Summary (Addendum)
Heather Frederick, is a 61 y.o. female  DOB 08/07/1955  MRN [id number].  Admission date:  02/05/2017  Admitting Physician  [name], MD  Discharge Date:  02/06/2017   Primary MD  [name], MD  Recommendations for primary care physician for things to follow:   Uti Please f/u on urine culture Keflex 500mg  po bid x 6 days  Dizziness likely secondary to UTI.  Resolving.     Nausea , dry heave resolved Zofran prn Continue protonix  Dehydration/ tachycardia Resolved.    Abdominal pain Secondary to pancreatic cancer Cont oxycodone   Dm2 Increase lantus to 10 units [place] qday Cont novolog  Abnormal liver function Likely secondary to pancreatic cancer  Metastatic pancreatic cancer to liver, lung F/u with oncology as outpatient        Admission Diagnosis  Dizziness intractable nausea vomiting    Discharge Diagnosis  Dizziness intractable nausea vomiting     Active Problems:   Dizziness      Past Medical History:  Diagnosis Date  . Allergy   . Anxiety   . Arthritis   . Benign essential HTN 09/23/2014  . Cancer ([place])   . Chronic kidney disease    uti  . Depression   . Dyslipidemia   . Elevated liver enzymes   . Family history of anesthesia complication    father has a severe hard time waking up  . Gallstones    a. Seen on CT 01/2014.  Marland Kitchen GERD (gastroesophageal reflux disease)   . Hard of hearing   . Hepatic steatosis   . History of frequent urinary tract infections   . Hyperlipidemia   . Hypertension   . Lateral epicondylitis of right elbow   . Mental disorder   . Meralgia paresthetica of right side 10/02/2012   slight at 05/2014  . Migraine headache   . Obesity   . OSA (obstructive sleep apnea)    severe with AHI 37/hr now on CPAP at 18cm H2O  . Osteoarthritis   . Pneumonia    Feb 2018  . Raynaud disease    in feet per patient   . Sleep apnea    wears c-pap  . Urinary tract infection     Past Surgical History:  Procedure Laterality Date  . ABDOMINAL HYSTERECTOMY  1/04   partial  . BLADDER SUSPENSION  6/10  . CARDIAC CATHETERIZATION    . carpel tunnel left/right  7/08, 8/08 Bilateral 8/08 and 7/08  . carpel tunnel rel Right 4/12  . COLONOSCOPY    . IR CV LINE INJECTION  11/24/2016  . IR FLUORO GUIDE PORT INSERTION RIGHT  08/28/2016  . IR US GUIDE VASC ACCESS RIGHT  08/28/2016  . JOINT REPLACEMENT    . KNEE ARTHROSCOPY Left 12/12  . KNEE ARTHROSCOPY Right 12/06  . PLANTAR FASCIA RELEASE Right 12/10  . radial tunnel release     right arm   . ROTATOR CUFF REPAIR Left 6/11  . tennis elbow release Right 7/04  HPI  from the history and physical done on the day of admission:     61 y.o. female, w pancreatic cancer (metastatic), c/o nausea since her last chemo on Wednesday, and also w drying heaving. Pt has had very poor po intake since last Wednesday when new chemo was initiated.  Felt terribly and presented to office for evaluation.  Slight sob for the past  1 day.  Pt denies fever, chills, cp, palp, diarrhea, brbpr, black stool.  Pt has chronic abdominal pain.  Dizziness x5 days,  Worse today and also yesterday.  No vertigo, felt really generally weak.  Pt will be admitted for dizziness r/o metastatic disease to brain and nausea and inability to eat, and n/v.         Hospital Course:     Pt was direct admitted w beads of perspiration on her head. , MRI brain negative. ua 6-30 wbc. Pt started on rocephin for UTI. Pt hydrated with Ns iv.  Pt feels almost back to baseline today.  Her bed is not comfortable.  Pt has been afebrile, she is requesting to go home.  Denies n/v, flank pain, abd pain, diarrhea, brbpr.  Pt is tolerating full liquid diet and will be discharged home today.    Follow UP  Follow-up Information    [name], MD Follow up in 1 week(s).   Specialty:  Internal Medicine Contact information: [address] [place] [address] [phone]            Consults obtained - none  Discharge Condition: stable  Diet and Activity recommendation: See Discharge Instructions below  Discharge Instructions         Discharge Medications     Allergies as of 02/06/2017      Reactions   Topamax [topiramate] Other (See Comments)   :Stroke like symptoms   Aleve [naproxen Sodium] Hives   Has tolerated Voltaren topical as well as aspirin.   Bee Venom Swelling   Echinacea Hives   Other Other (See Comments)   Feathers cause sinus congestion   Sulfa Antibiotics Hives   Advil [ibuprofen] Hives   Has tolerated Voltaren topical as well as aspirin.      Medication List    STOP taking these medications   nitrofurantoin 100 MG capsule Commonly known as:  MACRODANTIN     TAKE these medications   acetaminophen 650 MG CR tablet Commonly known as:  TYLENOL Take 1,300 mg by mouth every 8 (eight) hours as needed for pain.   aspirin EC 81 MG tablet Take 81 mg by mouth daily.   cephALEXin 500 MG capsule Commonly known as:  KEFLEX Take 1 capsule (500 mg total) 2 (two) times daily for 6 days by mouth.   citalopram 20 MG tablet Commonly known as:  CELEXA TAKE ONE (1) TABLET BY MOUTH EVERY DAY   CRANBERRY PLUS VITAMIN C PO Take 1 tablet by mouth daily. 4200 mg   cyclobenzaprine 5 MG tablet Commonly known as:  FLEXERIL Take 1 tablet (5 mg total) 3 (three) times daily as needed by mouth for muscle spasms.   eletriptan 40 MG tablet Commonly known as:  RELPAX Take 1 tablet (40 mg total) by mouth every 2 (two) hours as needed for migraine.   insulin aspart 100 UNIT/ML injection Commonly known as:  novoLOG Inject 0-9 Units 3 (three) times daily with meals into the skin.   insulin detemir 100 UNIT/ML injection Commonly known as:  LEVEMIR Inject 0.1 mLs (10  Units total) at bedtime into the skin. What changed:  how much to take   insulin lispro 100 UNIT/ML  injection Commonly known as:  HUMALOG Inject into the skin 3 (three) times daily before meals. Only takes when glucose is "very high"   lidocaine-prilocaine cream Commonly known as:  EMLA Apply 1 application topically as needed. Apply to [facility] a Cath site one hour prior to needle stick.   loratadine 10 MG tablet Commonly known as:  CLARITIN Take 10-20 mg by mouth 2 (two) times daily as needed for allergies.   LORazepam 0.5 MG tablet Commonly known as:  ATIVAN Take 1 tablet (0.5 mg total) by mouth every 8 (eight) hours as needed (for nausea).   LYRICA 200 MG capsule Generic drug:  pregabalin TAKE ONE (1) CAPSULE BY MOUTH EVERY MORNING   nitroGLYCERIN 0.4 MG SL tablet Commonly known as:  NITROSTAT Place 1 tablet (0.4 mg total) under the tongue every 5 (five) minutes as needed for chest pain.   nystatin 100000 UNIT/ML suspension Commonly known as:  MYCOSTATIN Take 5 mLs (500,000 Units total) 4 (four) times daily by mouth.   ondansetron 8 MG disintegrating tablet Commonly known as:  ZOFRAN-ODT Take 1 tablet (8 mg total) by mouth every 8 (eight) hours as needed for nausea or vomiting.   oxybutynin 10 MG 24 hr tablet Commonly known as:  DITROPAN-XL Take 10 mg by mouth every morning.   oxyCODONE 5 MG immediate release tablet Commonly known as:  Oxy IR/ROXICODONE Take 1 tablet (5 mg total) every 4 (four) hours as needed by mouth for severe pain.   pantoprazole 40 MG tablet Commonly known as:  PROTONIX TAKE ONE (1) TABLET BY MOUTH TWO (2) TIMES DAILY   polyethylene glycol packet Commonly known as:  MIRALAX / GLYCOLAX Take 17 g by mouth daily.   ranitidine 300 MG tablet Commonly known as:  ZANTAC TAKE ONE TABLET BY MOUTH EVERY EVENING   XARELTO 20 MG Tabs tablet Generic drug:  rivaroxaban Take 20 mg daily by mouth.       Major procedures and Radiology Reports - PLEASE review detailed and final reports for all details, in brief -      Dg Chest 2 View  Result  Date: 02/05/2017 CLINICAL DATA:  Cough with nausea and vomiting. Pancreatic carcinoma EXAM: CHEST  2 VIEW COMPARISON:  Chest CT 12/06/2016.  Chest x-ray 06/26/2016 FINDINGS: Heart size and vascularity normal. Negative for pneumonia. Left lower lobe scar/ atelectasis. Negative for pleural effusion 8 mm right middle lobe nodule as noted on CT. Possible metastatic disease. No other lung nodule. Port-A-Cath tip in the SVC IMPRESSION: 8 mm right middle lobe nodule unchanged from recent CT. Possible metastatic disease. Electronically Signed   By: Franchot Gallo M.D.   On: 02/05/2017 17:02   Mr Jeri Cos XQ Contrast  Result Date: 02/05/2017 CLINICAL DATA:  Pancreas neoplasm, here for staging. History of hypertension, hyperlipidemia. EXAM: MRI HEAD WITHOUT AND WITH CONTRAST TECHNIQUE: Multiplanar, multiecho pulse sequences of the brain and surrounding structures were obtained without and with intravenous contrast. Patient could not tolerate further imaging and axial T2 sequence not obtained. CONTRAST:  35mL MULTIHANCE GADOBENATE DIMEGLUMINE 529 MG/ML IV SOLN COMPARISON:  Neither images for report from prior MRI of the brain July 04, 2011 available at time of study interpretation. FINDINGS: Moderately motion degraded examination, fast sequences utilized. INTRACRANIAL CONTENTS: No reduced diffusion to suggest acute ischemia or hypercellular tumor. No susceptibility artifact to suggest hemorrhage. The ventricles and sulci are  normal for patient's age, cavum septum pellucidum et vergae. No suspicious parenchymal signal, masses, mass effect. No abnormal intraparenchymal or extra-axial enhancement. No abnormal extra-axial fluid collections. No extra-axial masses. VASCULAR: Normal major intracranial vascular flow voids present at skull base. SKULL AND UPPER CERVICAL SPINE: No abnormal sellar expansion. No suspicious calvarial bone marrow signal. Craniocervical junction maintained. SINUSES/ORBITS: The mastoid air-cells and  included paranasal sinuses are well-aerated.The included ocular globes and orbital contents are non-suspicious. OTHER: None. IMPRESSION: 1. Negative moderately motion degraded MRI of the head with and without contrast for age. Electronically Signed   By: Elon Alas M.D.   On: 02/05/2017 23:06    Micro Results   No results found for this or any previous visit (from the past 240 hour(s)).     Today   Subjective    Heather Frederick today has no headache,no chest abdominal pain,no new weakness tingling or numbness, no dysuria, no hematuria. No flank pain.   feels much better wants to go home today.   Objective   Blood pressure (!) 101/43, pulse 97, temperature 98.2 F (36.8 C), temperature source Oral, resp. rate 20, SpO2 98 %.   Intake/Output Summary (Last 24 hours) at 02/06/2017 1223 Last data filed at 02/06/2017 0920 Gross per 24 hour  Intake 240 ml  Output -  Net 240 ml    Exam Awake Alert, Oriented x 3, No new F.N deficits, Normal affect [place].AT,PERRAL Supple Neck,No JVD, No cervical lymphadenopathy appriciated.  Symmetrical Chest wall movement, Good air movement bilaterally, CTAB RRR,No Gallops,Rubs or new Murmurs, No Parasternal Heave +ve B.Sounds, Abd Soft, Non tender, No organomegaly appriciated, No rebound -guarding or rigidity. No Cyanosis, Clubbing or edema, No new Rash or bruise NO CVA tenderness   Data Review   CBC w Diff:  Lab Results  Component Value Date   WBC 9.3 02/06/2017   HGB 11.9 (L) 02/06/2017   HGB 11.0 (L) 01/31/2017   HCT 36.5 02/06/2017   HCT 34.0 (L) 01/31/2017   PLT 163 02/06/2017   PLT 228 01/31/2017   LYMPHOPCT 19.0 01/31/2017   MONOPCT 14.7 (H) 01/31/2017   EOSPCT 4.1 01/31/2017   BASOPCT 0.8 01/31/2017    CMP:  Lab Results  Component Value Date   NA 134 (L) 02/06/2017   NA 138 01/31/2017   K 3.9 02/06/2017   K 4.0 01/31/2017   CL 99 (L) 02/06/2017   CO2 26 02/06/2017   CO2 24 01/31/2017   BUN 18 02/06/2017   BUN 8.6  01/31/2017   CREATININE 0.92 02/06/2017   CREATININE 0.8 01/31/2017   GLU 365 (H) 09/29/2016   PROT 6.9 02/06/2017   PROT 6.8 01/31/2017   ALBUMIN 3.1 (L) 02/06/2017   ALBUMIN 3.2 (L) 01/31/2017   BILITOT 0.5 02/06/2017   BILITOT 0.47 01/31/2017   ALKPHOS 134 (H) 02/06/2017   ALKPHOS 129 01/31/2017   AST 15 02/06/2017   AST 20 01/31/2017   ALT 21 02/06/2017   ALT 19 01/31/2017  .   Total Time in preparing paper work, data evaluation and todays exam - 54 minutes  [name] M.D on 02/06/2017 at 12:23 PM  [facility]   Office  [phone]

## 2017-02-06 NOTE — Care Management Note (Signed)
Case Management Note  Patient Details  Name: Heather Frederick MRN: [id number] Date of Birth: September 19, 1955  Subjective/Objective:                  abd pain, dizziness  Action/Plan: Date: February 06, 2017 Velva Harman, BSN, [place], [place] Chart and notes review for patient progress and needs. Will follow for case management and discharge needs. Next review date: [id number]  Expected Discharge Date:  (unknown)               Expected Discharge Plan:  Home/Self Care  In-House Referral:     Discharge planning Services  CM Consult  Post Acute Care Choice:    Choice offered to:     DME Arranged:    DME Agency:     HH Arranged:    HH Agency:     Status of Service:  In process, will continue to follow  If discussed at Long Length of Stay Meetings, dates discussed:    Additional Comments:  Leeroy Cha, RN 02/06/2017, 9:16 AM

## 2017-02-06 NOTE — Progress Notes (Signed)
Inpatient Diabetes Program Recommendations  AACE/ADA: New Consensus Statement on Inpatient Glycemic Control (2015)  Target Ranges:  Prepandial:   less than 140 mg/dL      Peak postprandial:   less than 180 mg/dL (1-2 hours)      Critically ill patients:  140 - 180 mg/dL   Results for CECYLIA, BRAZILL (MRN [id number]) as of 02/06/2017 08:55  Ref. Range 02/05/2017 18:01 02/05/2017 20:38 02/05/2017 23:35 02/06/2017 05:01 02/06/2017 07:28  Glucose-Capillary Latest Ref Range: 65 - 99 mg/dL 173 (H) 212 (H) 249 (H) 197 (H) 207 (H)    Admit with: Dizziness/ Nausea  History: DM, Metastatic Pancreatic Cancer  Home DM Meds: Levemir 7 units QHS       Humalog SSI TID  Current Insulin Orders: Novolog Sensitive Correction Scale/ SSI (0-9 units) TID AC       MD- Please consider the following in-hospital insulin adjustments:  1. Start Levemir 7 units daily (home dose)  2. Increase Novolog SSI to Moderate scale (0-15 units) TID AC + HS       --Will follow patient during hospitalization--  Wyn Quaker RN, MSN, CDE Diabetes Coordinator Inpatient Glycemic Control Team Team Pager: [phone] (8a-5p)

## 2017-02-06 NOTE — Progress Notes (Signed)
MR Brain competed yesterday.  Spoke with Dr. Maudie Mercury.  Will d/c order MR brain for today per Dr. Maudie Mercury.

## 2017-02-06 NOTE — Progress Notes (Signed)
Discharge instructions and medications discussed with patient.  AVS given to patient.  All questions answered.  

## 2017-02-08 ENCOUNTER — Other Ambulatory Visit: Payer: Self-pay | Admitting: *Deleted

## 2017-02-08 ENCOUNTER — Telehealth: Payer: Self-pay

## 2017-02-08 MED ORDER — HYDROMORPHONE HCL 4 MG PO TABS: 4.0000 mg | ORAL_TABLET | ORAL | 0 refills | Status: DC | PRN

## 2017-02-08 MED FILL — HYDROmorphone HCL 4 MG TABS: 4 | 5 days supply | Qty: 60 | Fill #0

## 2017-02-08 NOTE — Telephone Encounter (Signed)
Pt called that her abdomen is still hurting. The oxycodone works for about 2-2.5 hours. 4-5/10. Abdomen and all around to back, mostly in her back. At worst 10/10. Back is sharp and throbbing.  Abdomen sharp but different from back "like someone digging in there". No bloating. LBM yesterday, good BM. Is on antibiotic for UTI. Have not been eating great. No fever. No radiation. Is drinking water.   "feeling better overall but hurting"

## 2017-02-08 NOTE — Telephone Encounter (Signed)
S/w Dr Benay Spice and rx dilaudid prepared for pt. Called pt to let her know.  Discussed not taking oxycodone at same time as dilaudid.

## 2017-02-11 LAB — CULTURE, BLOOD (ROUTINE X 2)
CULTURE: NO GROWTH
Culture: NO GROWTH
SPECIAL REQUESTS: ADEQUATE
SPECIAL REQUESTS: ADEQUATE

## 2017-02-13 ENCOUNTER — Other Ambulatory Visit: Payer: 59

## 2017-02-13 ENCOUNTER — Ambulatory Visit: Payer: 59 | Admitting: Oncology

## 2017-02-13 ENCOUNTER — Other Ambulatory Visit: Payer: Self-pay | Admitting: *Deleted

## 2017-02-13 ENCOUNTER — Ambulatory Visit: Payer: 59

## 2017-02-13 DIAGNOSIS — C259 Malignant neoplasm of pancreas, unspecified: Secondary | ICD-10-CM

## 2017-02-13 DIAGNOSIS — C787 Secondary malignant neoplasm of liver and intrahepatic bile duct: Principal | ICD-10-CM

## 2017-02-13 MED ORDER — LORAZEPAM 0.5 MG PO TABS: 0.5000 mg | ORAL_TABLET | Freq: Three times a day (TID) | ORAL | 0 refills | Status: DC | PRN

## 2017-02-14 ENCOUNTER — Ambulatory Visit: Payer: 59

## 2017-02-14 ENCOUNTER — Other Ambulatory Visit: Payer: 59

## 2017-02-14 ENCOUNTER — Ambulatory Visit: Payer: 59 | Admitting: Oncology

## 2017-02-16 ENCOUNTER — Other Ambulatory Visit: Payer: Self-pay | Admitting: *Deleted

## 2017-02-16 DIAGNOSIS — C259 Malignant neoplasm of pancreas, unspecified: Secondary | ICD-10-CM

## 2017-02-16 DIAGNOSIS — C787 Secondary malignant neoplasm of liver and intrahepatic bile duct: Principal | ICD-10-CM

## 2017-02-18 ENCOUNTER — Other Ambulatory Visit: Payer: Self-pay | Admitting: Oncology

## 2017-02-19 ENCOUNTER — Other Ambulatory Visit (HOSPITAL_BASED_OUTPATIENT_CLINIC_OR_DEPARTMENT_OTHER): Payer: 59 | Admitting: *Deleted

## 2017-02-19 ENCOUNTER — Ambulatory Visit (HOSPITAL_BASED_OUTPATIENT_CLINIC_OR_DEPARTMENT_OTHER): Payer: 59

## 2017-02-19 ENCOUNTER — Telehealth: Payer: Self-pay | Admitting: Oncology

## 2017-02-19 ENCOUNTER — Other Ambulatory Visit (HOSPITAL_BASED_OUTPATIENT_CLINIC_OR_DEPARTMENT_OTHER): Payer: 59

## 2017-02-19 ENCOUNTER — Ambulatory Visit (HOSPITAL_BASED_OUTPATIENT_CLINIC_OR_DEPARTMENT_OTHER): Payer: 59 | Admitting: Oncology

## 2017-02-19 VITALS — BP 118/55 | HR 106 | Temp 97.8°F | Resp 18 | Ht 63.0 in | Wt 264.0 lb

## 2017-02-19 DIAGNOSIS — C259 Malignant neoplasm of pancreas, unspecified: Secondary | ICD-10-CM

## 2017-02-19 DIAGNOSIS — Z452 Encounter for adjustment and management of vascular access device: Secondary | ICD-10-CM

## 2017-02-19 DIAGNOSIS — G893 Neoplasm related pain (acute) (chronic): Secondary | ICD-10-CM

## 2017-02-19 DIAGNOSIS — R32 Unspecified urinary incontinence: Secondary | ICD-10-CM

## 2017-02-19 DIAGNOSIS — C250 Malignant neoplasm of head of pancreas: Secondary | ICD-10-CM

## 2017-02-19 DIAGNOSIS — C787 Secondary malignant neoplasm of liver and intrahepatic bile duct: Secondary | ICD-10-CM

## 2017-02-19 DIAGNOSIS — E119 Type 2 diabetes mellitus without complications: Secondary | ICD-10-CM | POA: Diagnosis not present

## 2017-02-19 DIAGNOSIS — R300 Dysuria: Secondary | ICD-10-CM

## 2017-02-19 DIAGNOSIS — Z5111 Encounter for antineoplastic chemotherapy: Secondary | ICD-10-CM

## 2017-02-19 DIAGNOSIS — Z95828 Presence of other vascular implants and grafts: Secondary | ICD-10-CM

## 2017-02-19 LAB — COMPREHENSIVE METABOLIC PANEL
ALBUMIN: 3 g/dL — AB (ref 3.5–5.0)
ALK PHOS: 148 U/L (ref 40–150)
ALT: 17 U/L (ref 0–55)
AST: 19 U/L (ref 5–34)
Anion Gap: 10 mEq/L (ref 3–11)
BUN: 6.3 mg/dL — ABNORMAL LOW (ref 7.0–26.0)
CO2: 25 meq/L (ref 22–29)
Calcium: 9.6 mg/dL (ref 8.4–10.4)
Chloride: 102 mEq/L (ref 98–109)
Creatinine: 0.8 mg/dL (ref 0.6–1.1)
EGFR: >60 mL/min/{1.73_m2} (ref >60)
GLUCOSE: 161 mg/dL — AB (ref 70–140)
POTASSIUM: 4 meq/L (ref 3.5–5.1)
SODIUM: 137 meq/L (ref 136–145)
Total Bilirubin: 0.66 mg/dL (ref 0.20–1.20)
Total Protein: 7.1 g/dL (ref 6.4–8.3)

## 2017-02-19 LAB — CBC WITH DIFFERENTIAL/PLATELET
BASO%: 0.3 % (ref 0.0–2.0)
BASOS ABS: 0 10*3/uL (ref 0.0–0.1)
EOS%: 3.8 % (ref 0.0–7.0)
Eosinophils Absolute: 0.3 10*3/uL (ref 0.0–0.5)
HCT: 35.3 % (ref 34.8–46.6)
HGB: 11.1 g/dL — ABNORMAL LOW (ref 11.6–15.9)
LYMPH%: 16.9 % (ref 14.0–49.7)
MCH: 28.4 pg (ref 25.1–34.0)
MCHC: 31.4 g/dL — AB (ref 31.5–36.0)
MCV: 90.3 fL (ref 79.5–101.0)
MONO#: 1.2 10*3/uL — AB (ref 0.1–0.9)
MONO%: 18.2 % — ABNORMAL HIGH (ref 0.0–14.0)
NEUT#: 4.1 10*3/uL (ref 1.5–6.5)
NEUT%: 60.8 % (ref 38.4–76.8)
PLATELETS: 182 10*3/uL (ref 145–400)
RBC: 3.91 10*6/uL (ref 3.70–5.45)
RDW: 15.1 % — ABNORMAL HIGH (ref 11.2–14.5)
WBC: 6.8 10*3/uL (ref 3.9–10.3)
lymph#: 1.2 10*3/uL (ref 0.9–3.3)

## 2017-02-19 LAB — URINALYSIS, MICROSCOPIC - CHCC
BILIRUBIN (URINE): NEGATIVE
Glucose: NEGATIVE mg/dL
Ketones: NEGATIVE mg/dL
NITRITE: NEGATIVE
Protein: 30 mg/dL
Specific Gravity, Urine: 1.015 (ref 1.003–1.035)
UROBILINOGEN UR: 0.2 mg/dL (ref 0.2–1)
pH: 7 (ref 4.6–8.0)

## 2017-02-19 MED ORDER — LORAZEPAM 0.5 MG PO TABS: 0.5000 mg | ORAL_TABLET | Freq: Three times a day (TID) | ORAL | 0 refills | Status: DC | PRN

## 2017-02-19 MED ORDER — SODIUM CHLORIDE 0.9 % IV SOLN
2400.0000 mg/m2 | INTRAVENOUS | Status: DC
  Administered 2017-02-19: 5550 mg via INTRAVENOUS
  Filled 2017-02-19: qty 111

## 2017-02-19 MED ORDER — PALONOSETRON HCL INJECTION 0.25 MG/5ML
0.2500 mg | Freq: Once | INTRAVENOUS | Status: AC
  Administered 2017-02-19: 0.25 mg via INTRAVENOUS

## 2017-02-19 MED ORDER — DEXTROSE 5 % IV SOLN
400.0000 mg/m2 | Freq: Once | INTRAVENOUS | Status: AC
  Administered 2017-02-19: 924 mg via INTRAVENOUS
  Filled 2017-02-19: qty 46.2

## 2017-02-19 MED ORDER — SODIUM CHLORIDE 0.9 % IV SOLN
Freq: Once | INTRAVENOUS | Status: AC
  Administered 2017-02-19: 11:00:00 via INTRAVENOUS
  Filled 2017-02-19: qty 5

## 2017-02-19 MED ORDER — DEXTROSE 5 % IV SOLN
200.0000 mg | Freq: Once | INTRAVENOUS | Status: AC
  Administered 2017-02-19: 200 mg via INTRAVENOUS
  Filled 2017-02-19: qty 40

## 2017-02-19 MED ORDER — PALONOSETRON HCL INJECTION 0.25 MG/5ML
INTRAVENOUS | Status: AC
  Filled 2017-02-19: qty 5

## 2017-02-19 MED ORDER — DEXTROSE 5 % IV SOLN
Freq: Once | INTRAVENOUS | Status: AC
  Administered 2017-02-19: 10:00:00 via INTRAVENOUS

## 2017-02-19 MED ORDER — SODIUM CHLORIDE 0.9% FLUSH
10.0000 mL | INTRAVENOUS | Status: DC | PRN
  Administered 2017-02-19: 10 mL via INTRAVENOUS
  Filled 2017-02-19: qty 10

## 2017-02-19 NOTE — Patient Instructions (Signed)
Implanted Port Home Guide An implanted port is a type of central line that is placed under the skin. Central lines are used to provide IV access when treatment or nutrition needs to be given through a person's veins. Implanted ports are used for long-term IV access. An implanted port may be placed because:  You need IV medicine that would be irritating to the small veins in your hands or arms.  You need long-term IV medicines, such as antibiotics.  You need IV nutrition for a long period.  You need frequent blood draws for lab tests.  You need dialysis.  Implanted ports are usually placed in the chest area, but they can also be placed in the upper arm, the abdomen, or the leg. An implanted port has two main parts:  Reservoir. The reservoir is round and will appear as a small, raised area under your skin. The reservoir is the part where a needle is inserted to give medicines or draw blood.  Catheter. The catheter is a thin, flexible tube that extends from the reservoir. The catheter is placed into a large vein. Medicine that is inserted into the reservoir goes into the catheter and then into the vein.  How will I care for my incision site? Do not get the incision site wet. Bathe or shower as directed by your health care provider. How is my port accessed? Special steps must be taken to access the port:  Before the port is accessed, a numbing cream can be placed on the skin. This helps numb the skin over the port site.  Your health care provider uses a sterile technique to access the port. ? Your health care provider must put on a mask and sterile gloves. ? The skin over your port is cleaned carefully with an antiseptic and allowed to dry. ? The port is gently pinched between sterile gloves, and a needle is inserted into the port.  Only "non-coring" port needles should be used to access the port. Once the port is accessed, a blood return should be checked. This helps ensure that the port  is in the vein and is not clogged.  If your port needs to remain accessed for a constant infusion, a clear (transparent) bandage will be placed over the needle site. The bandage and needle will need to be changed every week, or as directed by your health care provider.  Keep the bandage covering the needle clean and dry. Do not get it wet. Follow your health care provider's instructions on how to take a shower or bath while the port is accessed.  If your port does not need to stay accessed, no bandage is needed over the port.  What is flushing? Flushing helps keep the port from getting clogged. Follow your health care provider's instructions on how and when to flush the port. Ports are usually flushed with saline solution or a medicine called heparin. The need for flushing will depend on how the port is used.  If the port is used for intermittent medicines or blood draws, the port will need to be flushed: ? After medicines have been given. ? After blood has been drawn. ? As part of routine maintenance.  If a constant infusion is running, the port may not need to be flushed.  How long will my port stay implanted? The port can stay in for as long as your health care provider thinks it is needed. When it is time for the port to come out, surgery will be   done to remove it. The procedure is similar to the one performed when the port was put in. When should I seek immediate medical care? When you have an implanted port, you should seek immediate medical care if:  You notice a bad smell coming from the incision site.  You have swelling, redness, or drainage at the incision site.  You have more swelling or pain at the port site or the surrounding area.  You have a fever that is not controlled with medicine.  This information is not intended to replace advice given to you by your health care provider. Make sure you discuss any questions you have with your health care provider. Document  Released: 03/13/2005 Document Revised: 08/19/2015 Document Reviewed: 11/18/2012 Elsevier Interactive Patient Education  2017 [employer].  

## 2017-02-19 NOTE — Patient Instructions (Signed)
[facility] Discharge Instructions for Patients Receiving Chemotherapy  Today you received the following chemotherapy agents :  Oxaliplatin,  Leucovorin,  Fluorouracil.  To help prevent nausea and vomiting after your treatment, we encourage you to take your nausea medication as prescribed.   If you develop nausea and vomiting that is not controlled by your nausea medication, call the clinic.   BELOW ARE SYMPTOMS THAT SHOULD BE REPORTED IMMEDIATELY:  *FEVER GREATER THAN 100.5 F  *CHILLS WITH OR WITHOUT FEVER  NAUSEA AND VOMITING THAT IS NOT CONTROLLED WITH YOUR NAUSEA MEDICATION  *UNUSUAL SHORTNESS OF BREATH  *UNUSUAL BRUISING OR BLEEDING  TENDERNESS IN MOUTH AND THROAT WITH OR WITHOUT PRESENCE OF ULCERS  *URINARY PROBLEMS  *BOWEL PROBLEMS  UNUSUAL RASH Items with * indicate a potential emergency and should be followed up as soon as possible.  Feel free to call the clinic should you have any questions or concerns. The clinic phone number is [phone].  Please show the CHEMO ALERT CARD at check-in to the Emergency Department and triage nurse.   

## 2017-02-19 NOTE — Progress Notes (Signed)
[facility] OFFICE PROGRESS NOTE   Diagnosis: Pancreas cancer  INTERVAL HISTORY:   [name] returns as scheduled.  She completed cycle 1 FOLFOX on 01/31/2017.  She was admitted 02/05/2017 with a urinary tract infection and dehydration.  She continues to have urinary incontinence which she feels is a symptom of a persistent infection.  She has intermittent nausea. Her chief complaint is abdomen/back pain.  She is taking oxycodone for pain.  Neuropathy symptoms in the hands are stable. Her appetite has diminished.  Objective:  Vital signs in last 24 hours:  Blood pressure (!) 118/55, pulse (!) 106, temperature 97.8 F (36.6 C), temperature source Oral, resp. rate 18, height _0  (1.6 m), weight 264 lb (119.7 kg), SpO2 97 %.    HEENT: No thrush or ulcers Resp: Lungs clear bilaterally Cardio: Regular rate and rhythm GI: No hepatomegaly, mild diffuse tenderness, no mass Vascular: No leg edema  Skin: Palms without erythema  Portacath/PICC-without erythema  Lab Results:  Lab Results  Component Value Date   WBC 6.8 02/19/2017   HGB 11.1 (L) 02/19/2017   HCT 35.3 02/19/2017   MCV 90.3 02/19/2017   PLT 182 02/19/2017   NEUTROABS 4.1 02/19/2017    CMP     Component Value Date/Time   NA 137 02/19/2017 0812   K 4.0 02/19/2017 0812   CL 99 (L) 02/06/2017 0446   CO2 25 02/19/2017 0812   GLUCOSE 161 (H) 02/19/2017 0812   BUN 6.3 (L) 02/19/2017 0812   CREATININE 0.8 02/19/2017 0812   CALCIUM 9.6 02/19/2017 0812   PROT 7.1 02/19/2017 0812   ALBUMIN 3.0 (L) 02/19/2017 0812   AST 19 02/19/2017 0812   ALT 17 02/19/2017 0812   ALKPHOS 148 02/19/2017 0812   BILITOT 0.66 02/19/2017 0812   GFRNONAA >60 02/06/2017 0446   GFRAA >60 02/06/2017 0446     Medications: I have reviewed the patient's current medications.  Assessment/Plan: 1. Metastatic pancreas cancer ? Pancreas body mass and liver metastases noted on CT abdomen/pelvis 08/11/2016 ? Ultrasound-guided  biopsy of a right liver lesion 08/16/2016 revealed poorly differentiated adenocarcinoma consistent with pancreas cancer ? Foundation 1-microsatellite stable; tumor mutational burden 1; ERBB2 amplification ? Cycle 1 gemcitabine/Abraxane 09/13/2016; 09/29/2016 ? Cycle 2 gemcitabine/Abraxane 10/11/2016, 10/18/2016 ? Cycle 3 gemcitabine/Abraxane 11/01/2016, 11/08/2016 ? Cycle 4 gemcitabine/Abraxane 11/23/2016,11/29/2016 ? CT chest 12/06/2016-liver lesions appear smaller ? Cycle 5 gemcitabine/Abraxane 12/13/2016 ? CT abdomen/pelvis 01/01/2017-new and enlarging hepatic masses. Enlarging pancreatic mass. ? Cycle 6 gemcitabine/Abraxane 01/03/2017 ? Rising CA 19-9, increased pain October 2018 ? Cycle 1 FOLFOX 01/31/2017 ? Cycle 2 FOLFOX 02/19/2017  2. Pain secondary to pancreas cancer  3. Hypertension  4. Sleep apnea  5. Chronic low back pain  6. Recurrent urinary tract infections  7. Depression  8. Migraine headaches  9. Port-A-Cath placement 08/28/2016  10. Rash following cycle 1 gemcitabine/Abraxane-drug rash?  11. Nausea/vomiting following cycle 1 gemcitabine/Abraxane-antiemetic regimen adjusted with addition of Aloxi  12. Diabetes  13. Right cephalic vein thrombosis [id number] with Xarelto  14. CT chest 12/06/2016 done to evaluate dyspnea-several 3-4 mm nodular opacities in the lung parenchyma etiology uncertain. Known mass in the body of the pancreas measuring 3.3 x 2.4 cm;small enhancing lesion in the anterior dome of the liver measures 8 mm.  15. Right forearm rash following cycle 5 day 8 gemcitabine/Abraxane. Resolved  Disposition:  Heather Frederick appears unchanged.  She continues to have pain secondary to pancreas cancer.  She will continue oxycodone and hydromorphone as needed.  The plan is to proceed with cycle 2 FOLFOX today.  She will receive intravenous fluids on day 3.  She will return for office visit in 1 week.  She will be  scheduled for an office visit and cycle 3 chemotherapy 03/06/2017.  We will consider adding irinotecan to the chemotherapy regimen with cycle 3.  I adjusted the chemotherapy doses to her current weight.  We will check a urinalysis and culture today.  25 minutes were spent with the patient today.  The majority of the time was used for counseling and coordination of care.  Betsy Coder, MD  02/19/2017  9:16 AM

## 2017-02-19 NOTE — Telephone Encounter (Signed)
Scheduled appt per 11/26 los- Gave patient AVS and calender per los. Unable to scheduled IVF for 11/28 due to capped day - email sent to [place] for approval .

## 2017-02-20 LAB — URINE CULTURE

## 2017-02-21 ENCOUNTER — Ambulatory Visit: Payer: 59

## 2017-02-21 ENCOUNTER — Other Ambulatory Visit: Payer: 59

## 2017-02-21 ENCOUNTER — Ambulatory Visit (HOSPITAL_BASED_OUTPATIENT_CLINIC_OR_DEPARTMENT_OTHER): Payer: 59

## 2017-02-21 VITALS — BP 150/83 | HR 65 | Temp 97.7°F | Resp 20

## 2017-02-21 DIAGNOSIS — C250 Malignant neoplasm of head of pancreas: Secondary | ICD-10-CM | POA: Diagnosis not present

## 2017-02-21 DIAGNOSIS — C787 Secondary malignant neoplasm of liver and intrahepatic bile duct: Principal | ICD-10-CM

## 2017-02-21 DIAGNOSIS — Z452 Encounter for adjustment and management of vascular access device: Secondary | ICD-10-CM | POA: Diagnosis not present

## 2017-02-21 DIAGNOSIS — C259 Malignant neoplasm of pancreas, unspecified: Secondary | ICD-10-CM

## 2017-02-21 MED ORDER — HEPARIN SOD (PORK) LOCK FLUSH 100 UNIT/ML IV SOLN
500.0000 [IU] | Freq: Once | INTRAVENOUS | Status: AC | PRN
Start: 2017-02-21 — End: 2017-02-21
  Administered 2017-02-21: 500 [IU]
  Filled 2017-02-21: qty 5

## 2017-02-21 MED ORDER — SODIUM CHLORIDE 0.9% FLUSH
10.0000 mL | INTRAVENOUS | Status: DC | PRN
  Administered 2017-02-21: 10 mL
  Filled 2017-02-21: qty 10

## 2017-02-21 NOTE — Patient Instructions (Signed)
Implanted Port Home Guide An implanted port is a type of central line that is placed under the skin. Central lines are used to provide IV access when treatment or nutrition needs to be given through a person's veins. Implanted ports are used for long-term IV access. An implanted port may be placed because:  You need IV medicine that would be irritating to the small veins in your hands or arms.  You need long-term IV medicines, such as antibiotics.  You need IV nutrition for a long period.  You need frequent blood draws for lab tests.  You need dialysis.  Implanted ports are usually placed in the chest area, but they can also be placed in the upper arm, the abdomen, or the leg. An implanted port has two main parts:  Reservoir. The reservoir is round and will appear as a small, raised area under your skin. The reservoir is the part where a needle is inserted to give medicines or draw blood.  Catheter. The catheter is a thin, flexible tube that extends from the reservoir. The catheter is placed into a large vein. Medicine that is inserted into the reservoir goes into the catheter and then into the vein.  How will I care for my incision site? Do not get the incision site wet. Bathe or shower as directed by your health care provider. How is my port accessed? Special steps must be taken to access the port:  Before the port is accessed, a numbing cream can be placed on the skin. This helps numb the skin over the port site.  Your health care provider uses a sterile technique to access the port. ? Your health care provider must put on a mask and sterile gloves. ? The skin over your port is cleaned carefully with an antiseptic and allowed to dry. ? The port is gently pinched between sterile gloves, and a needle is inserted into the port.  Only "non-coring" port needles should be used to access the port. Once the port is accessed, a blood return should be checked. This helps ensure that the port  is in the vein and is not clogged.  If your port needs to remain accessed for a constant infusion, a clear (transparent) bandage will be placed over the needle site. The bandage and needle will need to be changed every week, or as directed by your health care provider.  Keep the bandage covering the needle clean and dry. Do not get it wet. Follow your health care provider's instructions on how to take a shower or bath while the port is accessed.  If your port does not need to stay accessed, no bandage is needed over the port.  What is flushing? Flushing helps keep the port from getting clogged. Follow your health care provider's instructions on how and when to flush the port. Ports are usually flushed with saline solution or a medicine called heparin. The need for flushing will depend on how the port is used.  If the port is used for intermittent medicines or blood draws, the port will need to be flushed: ? After medicines have been given. ? After blood has been drawn. ? As part of routine maintenance.  If a constant infusion is running, the port may not need to be flushed.  How long will my port stay implanted? The port can stay in for as long as your health care provider thinks it is needed. When it is time for the port to come out, surgery will be   done to remove it. The procedure is similar to the one performed when the port was put in. When should I seek immediate medical care? When you have an implanted port, you should seek immediate medical care if:  You notice a bad smell coming from the incision site.  You have swelling, redness, or drainage at the incision site.  You have more swelling or pain at the port site or the surrounding area.  You have a fever that is not controlled with medicine.  This information is not intended to replace advice given to you by your health care provider. Make sure you discuss any questions you have with your health care provider. Document  Released: 03/13/2005 Document Revised: 08/19/2015 Document Reviewed: 11/18/2012 Elsevier Interactive Patient Education  2017 [employer].  

## 2017-02-23 ENCOUNTER — Encounter (HOSPITAL_COMMUNITY): Payer: Self-pay | Admitting: Emergency Medicine

## 2017-02-23 ENCOUNTER — Emergency Department (HOSPITAL_COMMUNITY): Payer: 59

## 2017-02-23 ENCOUNTER — Other Ambulatory Visit: Payer: Self-pay

## 2017-02-23 ENCOUNTER — Observation Stay (HOSPITAL_COMMUNITY)
Admission: EM | Admit: 2017-02-23 | Discharge: 2017-02-24 | Disposition: A | Discharge status: Home / Self Care | Payer: 59 | Attending: Internal Medicine | Admitting: Internal Medicine

## 2017-02-23 DIAGNOSIS — I110 Hypertensive heart disease with heart failure: Secondary | ICD-10-CM | POA: Diagnosis not present

## 2017-02-23 DIAGNOSIS — C259 Malignant neoplasm of pancreas, unspecified: Secondary | ICD-10-CM | POA: Insufficient documentation

## 2017-02-23 DIAGNOSIS — I82619 Acute embolism and thrombosis of superficial veins of unspecified upper extremity: Secondary | ICD-10-CM | POA: Insufficient documentation

## 2017-02-23 DIAGNOSIS — C787 Secondary malignant neoplasm of liver and intrahepatic bile duct: Secondary | ICD-10-CM | POA: Diagnosis not present

## 2017-02-23 DIAGNOSIS — Z7901 Long term (current) use of anticoagulants: Secondary | ICD-10-CM | POA: Diagnosis not present

## 2017-02-23 DIAGNOSIS — A419 Sepsis, unspecified organism: Secondary | ICD-10-CM

## 2017-02-23 DIAGNOSIS — R0602 Shortness of breath: Secondary | ICD-10-CM | POA: Insufficient documentation

## 2017-02-23 DIAGNOSIS — E785 Hyperlipidemia, unspecified: Secondary | ICD-10-CM | POA: Insufficient documentation

## 2017-02-23 DIAGNOSIS — R531 Weakness: Secondary | ICD-10-CM | POA: Diagnosis not present

## 2017-02-23 DIAGNOSIS — R509 Fever, unspecified: Secondary | ICD-10-CM | POA: Diagnosis present

## 2017-02-23 DIAGNOSIS — I10 Essential (primary) hypertension: Secondary | ICD-10-CM | POA: Diagnosis present

## 2017-02-23 DIAGNOSIS — Z6841 Body Mass Index (BMI) 40.0 and over, adult: Secondary | ICD-10-CM | POA: Diagnosis not present

## 2017-02-23 DIAGNOSIS — R110 Nausea: Secondary | ICD-10-CM | POA: Diagnosis not present

## 2017-02-23 DIAGNOSIS — Z79899 Other long term (current) drug therapy: Secondary | ICD-10-CM | POA: Insufficient documentation

## 2017-02-23 DIAGNOSIS — I5030 Unspecified diastolic (congestive) heart failure: Secondary | ICD-10-CM | POA: Insufficient documentation

## 2017-02-23 HISTORY — DX: Sepsis, unspecified organism: A41.9

## 2017-02-23 LAB — URINALYSIS, ROUTINE W REFLEX MICROSCOPIC
Bilirubin Urine: NEGATIVE
GLUCOSE, UA: NEGATIVE mg/dL
Ketones, ur: 5 mg/dL — AB
Nitrite: NEGATIVE
PH: 7 (ref 5.0–8.0)
PROTEIN: 30 mg/dL — AB
SPECIFIC GRAVITY, URINE: 1.008 (ref 1.005–1.030)

## 2017-02-23 LAB — CBC WITH DIFFERENTIAL/PLATELET
Basophils Absolute: 0 10*3/uL (ref 0.0–0.1)
Basophils Relative: 0 %
EOS ABS: 0.1 10*3/uL (ref 0.0–0.7)
EOS PCT: 0 %
HCT: 36.8 % (ref 36.0–46.0)
Hemoglobin: 12.2 g/dL (ref 12.0–15.0)
LYMPHS ABS: 1.4 10*3/uL (ref 0.7–4.0)
LYMPHS PCT: 7 %
MCH: 28.7 pg (ref 26.0–34.0)
MCHC: 33.2 g/dL (ref 30.0–36.0)
MCV: 86.6 fL (ref 78.0–100.0)
MONO ABS: 1 10*3/uL (ref 0.1–1.0)
Monocytes Relative: 5 %
Neutro Abs: 16.8 10*3/uL — ABNORMAL HIGH (ref 1.7–7.7)
Neutrophils Relative %: 88 %
PLATELETS: 184 10*3/uL (ref 150–400)
RBC: 4.25 MIL/uL (ref 3.87–5.11)
RDW: 15.3 % (ref 11.5–15.5)
WBC: 19.2 10*3/uL — AB (ref 4.0–10.5)

## 2017-02-23 LAB — I-STAT CG4 LACTIC ACID, ED
Lactic Acid, Venous: 1.7 mmol/L (ref 0.5–1.9)
Lactic Acid, Venous: 1.16 mmol/L (ref 0.5–1.9)

## 2017-02-23 LAB — COMPREHENSIVE METABOLIC PANEL
ALBUMIN: 3.1 g/dL — AB (ref 3.5–5.0)
ALT: 22 U/L (ref 14–54)
AST: 35 U/L (ref 15–41)
Alkaline Phosphatase: 145 U/L — ABNORMAL HIGH (ref 38–126)
Anion gap: 13 (ref 5–15)
BILIRUBIN TOTAL: 1.9 mg/dL — AB (ref 0.3–1.2)
BUN: 12 mg/dL (ref 6–20)
CHLORIDE: 95 mmol/L — AB (ref 101–111)
CO2: 22 mmol/L (ref 22–32)
CREATININE: 0.94 mg/dL (ref 0.44–1.00)
Calcium: 8.8 mg/dL — ABNORMAL LOW (ref 8.9–10.3)
GFR calc Af Amer: >60 mL/min (ref >60)
GLUCOSE: 228 mg/dL — AB (ref 65–99)
Potassium: 4.8 mmol/L (ref 3.5–5.1)
Sodium: 130 mmol/L — ABNORMAL LOW (ref 135–145)
TOTAL PROTEIN: 6.9 g/dL (ref 6.5–8.1)

## 2017-02-23 LAB — CBG MONITORING, ED: GLUCOSE-CAPILLARY: 135 mg/dL — AB (ref 65–99)

## 2017-02-23 LAB — GLUCOSE, CAPILLARY: GLUCOSE-CAPILLARY: 241 mg/dL — AB (ref 65–99)

## 2017-02-23 MED ORDER — OXYCODONE HCL 5 MG PO TABS
5.0000 mg | ORAL_TABLET | ORAL | Status: DC | PRN
  Administered 2017-02-23 – 2017-02-24 (×4): 5 mg via ORAL
  Filled 2017-02-23 (×4): qty 1

## 2017-02-23 MED ORDER — FENTANYL CITRATE (PF) 100 MCG/2ML IJ SOLN
25.0000 ug | Freq: Once | INTRAMUSCULAR | Status: AC
  Administered 2017-02-23: 25 ug via INTRAVENOUS
  Filled 2017-02-23: qty 2

## 2017-02-23 MED ORDER — SODIUM CHLORIDE 0.9 % IV BOLUS (SEPSIS)
1000.0000 mL | Freq: Once | INTRAVENOUS | Status: AC
  Administered 2017-02-23: 1000 mL via INTRAVENOUS

## 2017-02-23 MED ORDER — LORAZEPAM 0.5 MG PO TABS: 0.5000 mg | ORAL_TABLET | Freq: Three times a day (TID) | ORAL | Status: DC | PRN

## 2017-02-23 MED ORDER — ACETAMINOPHEN 325 MG PO TABS: 650.0000 mg | ORAL_TABLET | Freq: Four times a day (QID) | ORAL | Status: DC | PRN

## 2017-02-23 MED ORDER — INSULIN ASPART 100 UNIT/ML ~~LOC~~ SOLN
0.0000 [IU] | Freq: Three times a day (TID) | SUBCUTANEOUS | Status: DC
  Administered 2017-02-23: 1 [IU] via SUBCUTANEOUS
  Administered 2017-02-24: 2 [IU] via SUBCUTANEOUS

## 2017-02-23 MED ORDER — SODIUM CHLORIDE 0.9 % IV SOLN
INTRAVENOUS | Status: DC
  Administered 2017-02-23: 50 mL via INTRAVENOUS

## 2017-02-23 MED ORDER — ONDANSETRON HCL 4 MG/2ML IJ SOLN: 4.0000 mg | Freq: Three times a day (TID) | INTRAMUSCULAR | Status: DC | PRN

## 2017-02-23 MED ORDER — ACETAMINOPHEN 325 MG PO TABS
650.0000 mg | ORAL_TABLET | Freq: Once | ORAL | Status: AC
  Administered 2017-02-23: 650 mg via ORAL
  Filled 2017-02-23: qty 2

## 2017-02-23 MED ORDER — ASPIRIN EC 81 MG PO TBEC
81.0000 mg | DELAYED_RELEASE_TABLET | Freq: Every day | ORAL | Status: DC
  Administered 2017-02-23 – 2017-02-24 (×2): 81 mg via ORAL
  Filled 2017-02-23 (×2): qty 1

## 2017-02-23 MED ORDER — PANTOPRAZOLE SODIUM 40 MG PO TBEC
40.0000 mg | DELAYED_RELEASE_TABLET | Freq: Two times a day (BID) | ORAL | Status: DC
  Administered 2017-02-23 – 2017-02-24 (×3): 40 mg via ORAL
  Filled 2017-02-23 (×3): qty 1

## 2017-02-23 MED ORDER — RIVAROXABAN 20 MG PO TABS
20.0000 mg | ORAL_TABLET | Freq: Every day | ORAL | Status: DC
  Administered 2017-02-23 – 2017-02-24 (×2): 20 mg via ORAL
  Filled 2017-02-23 (×2): qty 1

## 2017-02-23 MED ORDER — PIPERACILLIN-TAZOBACTAM 3.375 G IVPB 30 MIN
3.3750 g | Freq: Once | INTRAVENOUS | Status: AC
  Administered 2017-02-23: 3.375 g via INTRAVENOUS
  Filled 2017-02-23: qty 50

## 2017-02-23 MED ORDER — INSULIN ASPART 100 UNIT/ML ~~LOC~~ SOLN
0.0000 [IU] | Freq: Every day | SUBCUTANEOUS | Status: DC
  Administered 2017-02-23: 2 [IU] via SUBCUTANEOUS

## 2017-02-23 MED ORDER — ONDANSETRON HCL 4 MG/2ML IJ SOLN: 4.0000 mg | Freq: Four times a day (QID) | INTRAMUSCULAR | Status: DC | PRN

## 2017-02-23 MED ORDER — ONDANSETRON HCL 4 MG/2ML IJ SOLN
4.0000 mg | Freq: Once | INTRAMUSCULAR | Status: AC
  Administered 2017-02-23: 4 mg via INTRAVENOUS
  Filled 2017-02-23: qty 2

## 2017-02-23 MED ORDER — SODIUM CHLORIDE 0.9 % IV SOLN
2500.0000 mg | INTRAVENOUS | Status: AC
  Administered 2017-02-23: 2500 mg via INTRAVENOUS
  Filled 2017-02-23: qty 2000

## 2017-02-23 MED ORDER — CITALOPRAM HYDROBROMIDE 20 MG PO TABS
20.0000 mg | ORAL_TABLET | Freq: Every day | ORAL | Status: DC
  Administered 2017-02-23 – 2017-02-24 (×2): 20 mg via ORAL
  Filled 2017-02-23: qty 2
  Filled 2017-02-23: qty 1

## 2017-02-23 MED ORDER — ONDANSETRON HCL 4 MG PO TABS: 4.0000 mg | ORAL_TABLET | Freq: Four times a day (QID) | ORAL | Status: DC | PRN

## 2017-02-23 MED ORDER — FAMOTIDINE 20 MG PO TABS
40.0000 mg | ORAL_TABLET | Freq: Every day | ORAL | Status: DC
  Administered 2017-02-23: 40 mg via ORAL
  Filled 2017-02-23: qty 2

## 2017-02-23 MED ORDER — PREGABALIN 75 MG PO CAPS
200.0000 mg | ORAL_CAPSULE | Freq: Every day | ORAL | Status: DC
  Administered 2017-02-23 – 2017-02-24 (×2): 200 mg via ORAL
  Filled 2017-02-23: qty 1
  Filled 2017-02-23: qty 4

## 2017-02-23 MED ORDER — HYDROXYZINE HCL 25 MG PO TABS: 25.0000 mg | ORAL_TABLET | Freq: Three times a day (TID) | ORAL | Status: DC | PRN

## 2017-02-23 MED ORDER — CYCLOBENZAPRINE HCL 5 MG PO TABS
5.0000 mg | ORAL_TABLET | Freq: Three times a day (TID) | ORAL | Status: DC | PRN
  Administered 2017-02-23: 5 mg via ORAL
  Filled 2017-02-23: qty 1

## 2017-02-23 MED ORDER — VANCOMYCIN HCL IN DEXTROSE 1-5 GM/200ML-% IV SOLN: 1000.0000 mg | Freq: Once | INTRAVENOUS | Status: DC

## 2017-02-23 MED ORDER — OXYBUTYNIN CHLORIDE ER 5 MG PO TB24
10.0000 mg | ORAL_TABLET | Freq: Every morning | ORAL | Status: DC
  Administered 2017-02-23 – 2017-02-24 (×2): 10 mg via ORAL
  Filled 2017-02-23: qty 2
  Filled 2017-02-23: qty 1

## 2017-02-23 MED ORDER — VITAMIN B-12 1000 MCG PO TABS
2500.0000 ug | ORAL_TABLET | Freq: Every day | ORAL | Status: DC
  Administered 2017-02-23: 2500 ug via ORAL
  Filled 2017-02-23: qty 2
  Filled 2017-02-23: qty 1

## 2017-02-23 MED ORDER — HYDROMORPHONE HCL 2 MG PO TABS
4.0000 mg | ORAL_TABLET | ORAL | Status: DC | PRN
  Administered 2017-02-23: 8 mg via ORAL
  Filled 2017-02-23: qty 4

## 2017-02-23 MED ORDER — INSULIN DETEMIR 100 UNIT/ML ~~LOC~~ SOLN
10.0000 [IU] | Freq: Every day | SUBCUTANEOUS | Status: DC
  Administered 2017-02-23: 10 [IU] via SUBCUTANEOUS
  Filled 2017-02-23 (×2): qty 0.1

## 2017-02-23 MED ORDER — SODIUM CHLORIDE 0.9 % IV SOLN: INTRAVENOUS | Status: DC

## 2017-02-23 MED ORDER — POLYETHYLENE GLYCOL 3350 17 G PO PACK
17.0000 g | PACK | Freq: Every day | ORAL | Status: DC
  Filled 2017-02-23 (×2): qty 1

## 2017-02-23 NOTE — ED Notes (Signed)
Pt O2 started dropping when she was sleeping. Placed on 2 L [place]. Pt stated she wears CPAP at night. MD notified and ordering CPAP.

## 2017-02-23 NOTE — Progress Notes (Signed)
Entered room with CPAP device and offered to patient.  She stated that she needed nasal pillows which we do not have here.  Informed patient that she could wear her pillows from home.  She stated that she would have someone bring up her machine and tubing from home and she would put on herself.  Offered assistance if needed.

## 2017-02-23 NOTE — ED Notes (Signed)
MD notified about pt's decreasing bp. Ordered lactic.

## 2017-02-23 NOTE — ED Notes (Signed)
Bed: WA14 Expected date:  Expected time:  Means of arrival:  Comments: Room 5 

## 2017-02-23 NOTE — H&P (Signed)
History and Physical  Heather Frederick:[id number] DOB: 06-Jun-1955 DOA: 02/23/2017  Referring physician: Julious Frederick, ER physician  PCP: Heather Gravel, MD  Outpatient Specialists: Sharon Mt, oncology Patient coming from: Home & is able to ambulate without assistance  Chief Complaint: Fever   HPI: Heather Frederick is a 61 y.o. female with past medical history significant for metastatic pancreatic cancer, diastolic heart failure and recent cephalic vein thrombosis on xarelto who has been on aggressive chemotherapy with only limited results and was hospitalized overnight 2 weeks ago for nausea and vomiting. At that time, she did not have fever and there were no reports of infection. Patient just received chemotherapy again this past Monday. At that time, she was complaining of some mild dysuria although her urinalysis done in the office was unremarkable and urine culture was negative. Patient was given a dose of Decadron at that time.   She continued to feel worse and checked her temperature at home noted to have fever as high as 102.9. She stated she felt a little short of breath. She had no dysuria, nausea/vomiting, abdominal pain, change in stool habits. She then came into the emergency room on the early morning of 11/30 for further evaluation.  ED Course: In the emergency room, patient afebrile, but noted to be borderline tachycardic initially. Her lab work was noteworthy for a CBG of 228, white blood cell count of 19.2 with 88% shift and lactic acid level within normal range 1.7. Chest x-ray only noted low lung volumes with some atelectasis. Urinalysis unremarkable. Patient had blood cultures drawn and then given a dose of broad-spectrum antibiotics and some IV fluids. Hospitalist called for further evaluation and admission.  Review of Systems: Patient seen in the emergency room . Pt complains of fatigue and she stated that she felt feverish earlier.  She felt very thirsty and she also  complained of some mild side pain which he just started up. She stated that she had not had any of her medications which she takes including those for pain  Pt denies any headaches, vision changes, dysphagia, chest pain, palpitations, shortness of breath, wheeze, cough, hematuria, dysuria, constipation, diarrhea, focal extremity numbness weakness or pain .  Review of systems are otherwise negative   Past Medical History:  Diagnosis Date  . Allergy   . Anxiety   . Arthritis   . Benign essential HTN 09/23/2014  . Cancer ([place])   . Chronic kidney disease    uti  . Depression   . Dyslipidemia   . Elevated liver enzymes   . Family history of anesthesia complication    father has a severe hard time waking up  . Gallstones    a. Seen on CT 01/2014.  Marland Kitchen GERD (gastroesophageal reflux disease)   . Hard of hearing   . Hepatic steatosis   . History of frequent urinary tract infections   . Hyperlipidemia   . Hypertension   . Lateral epicondylitis of right elbow   . Mental disorder   . Meralgia paresthetica of right side 10/02/2012   slight at 05/2014  . Migraine headache   . Obesity   . OSA (obstructive sleep apnea)    severe with AHI 37/hr now on CPAP at 18cm H2O  . Osteoarthritis   . Pneumonia    Feb 2018  . Raynaud disease    in feet per patient   . Sleep apnea    wears c-pap  . Urinary tract infection    Past Surgical History:  Procedure Laterality Date  . ABDOMINAL HYSTERECTOMY  1/04   partial  . BLADDER SUSPENSION  6/10  . CARDIAC CATHETERIZATION    . carpel tunnel left/right  7/08, 8/08 Bilateral 8/08 and 7/08  . carpel tunnel rel Right 4/12  . CHOLECYSTECTOMY N/A 06/11/2014   Procedure: LAPAROSCOPIC CHOLECYSTECTOMY WITH ATTEMPTED INTRAOPERATIVE CHOLANGIOGRAM;  Surgeon: Jackolyn Confer, MD;  Location: [facility];  Service: General;  Laterality: N/A;  . COLONOSCOPY    . ERCP N/A 10/19/2014   Procedure: ENDOSCOPIC RETROGRADE CHOLANGIOPANCREATOGRAPHY (ERCP);  Surgeon: Ladene Artist, MD;  Location: [facility];  Service: Endoscopy;  Laterality: N/A;  . INCISIONAL HERNIA REPAIR N/A 06/30/2016   Procedure: LAPAROSCOPIC REPAIR OF INCISIONAL HERNIA WITH MESH;  Surgeon: Jackolyn Confer, MD;  Location: [facility];  Service: General;  Laterality: N/A;  . INSERTION OF MESH N/A 06/30/2016   Procedure: INSERTION OF MESH;  Surgeon: Jackolyn Confer, MD;  Location: [facility];  Service: General;  Laterality: N/A;  . IR CV LINE INJECTION  11/24/2016  . IR FLUORO GUIDE PORT INSERTION RIGHT  08/28/2016  . IR US GUIDE VASC ACCESS RIGHT  08/28/2016  . JOINT REPLACEMENT    . KNEE ARTHROSCOPY Left 12/12  . KNEE ARTHROSCOPY Right 12/06  . LEFT HEART CATHETERIZATION WITH CORONARY ANGIOGRAM N/A 03/10/2014   Procedure: LEFT HEART CATHETERIZATION WITH CORONARY ANGIOGRAM;  Surgeon: Sinclair Grooms, MD;  Location: [facility];  Service: Cardiovascular;  Laterality: N/A;  . PLANTAR FASCIA RELEASE Right 12/10  . radial tunnel release     right arm   . ROTATOR CUFF REPAIR Left 6/11  . tennis elbow release Right 7/04  . TOTAL KNEE ARTHROPLASTY Left 09/10/2012   Procedure: TOTAL KNEE ARTHROPLASTY- left;  Surgeon: Garald Balding, MD;  Location: [facility];  Service: Orthopedics;  Laterality: Left;  Left total knee arthroplasty    Social History:  reports that  has never smoked. she has never used smokeless tobacco. She reports that she drinks alcohol. She reports that she does not use drugs. She lives at home with her husband. Normally she does ambulate without assistance   Allergies  Allergen Reactions  . Topamax [Topiramate] Other (See Comments)    :Stroke like symptoms  . Aleve [Naproxen Sodium] Hives    Has tolerated Voltaren topical as well as aspirin.  . Bee Venom Swelling  . Echinacea Hives  . Other Other (See Comments)    Feathers cause sinus congestion  . Sulfa Antibiotics Hives  . Advil [Ibuprofen] Hives    Has tolerated Voltaren topical as well as aspirin.    Family History  Problem  Relation Age of Onset  . Hypertension Mother   . Stroke Mother   . Liver disease Mother        Abcess  . Hypertension Father   . Coronary artery disease Father   . Migraines Father   . Clotting disorder Father   . Kidney failure Brother   . Hypertension Brother   . Migraines Brother   . Migraines Daughter   . Breast cancer Other        Niece with breast cancer  . Colon cancer Paternal Grandmother   . Pancreatic cancer Paternal Grandmother   . Stomach cancer Paternal Grandmother   . Breast cancer Cousin   . Esophageal cancer Neg Hx   . Rectal cancer Neg Hx       Prior to Admission medications   Medication Sig Start Date End Date Taking? Authorizing Provider  acetaminophen (TYLENOL)  650 MG CR tablet Take 1,300 mg by mouth every 8 (eight) hours as needed for pain.   Yes [provider]  aspirin EC 81 MG tablet Take 81 mg by mouth daily.   Yes [provider]  citalopram (CELEXA) 20 MG tablet TAKE ONE (1) TABLET BY MOUTH EVERY DAY 01/22/17  Yes Hoyt Koch, MD  Cranberry-Vitamin C-Vitamin E (CRANBERRY PLUS VITAMIN C PO) Take 1 tablet by mouth daily. 4200 mg   Yes [provider]  cyclobenzaprine (FLEXERIL) 5 MG tablet Take 1 tablet (5 mg total) 3 (three) times daily as needed by mouth for muscle spasms. Patient taking differently: Take 5 mg by mouth 3 (three) times daily.  02/02/17  Yes Tanner, Lyndon Code., PA-C  eletriptan (RELPAX) 40 MG tablet Take 1 tablet (40 mg total) by mouth every 2 (two) hours as needed for migraine. 06/26/16  Yes Dennie Bible, NP  HYDROmorphone (DILAUDID) 4 MG tablet Take 1-2 tablets (4-8 mg total) every 4 (four) hours as needed by mouth for severe pain. 02/08/17  Yes Ladell Pier, MD  hydrOXYzine (ATARAX/VISTARIL) 25 MG tablet Take 25 mg by mouth every 8 (eight) hours as needed for itching.  09/18/16  Yes [provider]  insulin detemir (LEVEMIR) 100 UNIT/ML injection Inject 0.1 mLs (10 Units total) at  bedtime into the skin. 02/06/17  Yes Heather Gravel, MD  insulin lispro (HUMALOG) 100 UNIT/ML injection Inject into the skin 3 (three) times daily before meals. Only takes when glucose is "very high"   Yes [provider]  lidocaine-prilocaine (EMLA) cream Apply 1 application topically as needed. Apply to [facility] a Cath site one hour prior to needle stick. 08/29/16  Yes Ladell Pier, MD  loratadine (CLARITIN) 10 MG tablet Take 10-40 mg by mouth daily as needed for allergies.    Yes [provider]  LORazepam (ATIVAN) 0.5 MG tablet Take 1 tablet (0.5 mg total) by mouth every 8 (eight) hours as needed (for nausea). 02/19/17  Yes Ladell Pier, MD  LYRICA 200 MG capsule TAKE ONE (1) CAPSULE BY MOUTH EVERY MORNING 12/18/16  Yes Dennie Bible, NP  Multiple Vitamin (MULTIVITAMIN WITH MINERALS) TABS tablet Take 1 tablet by mouth daily.   Yes [provider]  nitroGLYCERIN (NITROSTAT) 0.4 MG SL tablet Place 1 tablet (0.4 mg total) under the tongue every 5 (five) minutes as needed for chest pain. 06/28/15  Yes Belva Crome, MD  ondansetron (ZOFRAN-ODT) 8 MG disintegrating tablet Take 1 tablet (8 mg total) by mouth every 8 (eight) hours as needed for nausea or vomiting. 09/18/16  Yes Curcio, Roselie Awkward, NP  oxybutynin (DITROPAN-XL) 10 MG 24 hr tablet Take 10 mg by mouth every morning.  05/15/13  Yes [provider]  oxyCODONE (OXY IR/ROXICODONE) 5 MG immediate release tablet Take 1 tablet (5 mg total) every 4 (four) hours as needed by mouth for severe pain. 02/02/17  Yes Tanner, Lucianne Lei E., PA-C  pantoprazole (PROTONIX) 40 MG tablet TAKE ONE (1) TABLET BY MOUTH TWO (2) TIMES DAILY 09/25/16  Yes Ladene Artist, MD  polyethylene glycol (MIRALAX / GLYCOLAX) packet Take 17 g by mouth daily.   Yes [provider]  ranitidine (ZANTAC) 300 MG tablet TAKE ONE TABLET BY MOUTH EVERY EVENING 01/20/16  Yes Ladene Artist, MD  simethicone (MYLICON) 80 MG chewable tablet Chew 80-160  mg by mouth 2 (two) times daily as needed (stomach pain).   Yes [provider]  vitamin B-12 (  CYANOCOBALAMIN) 1000 MCG tablet Take 2,500 mcg by mouth every morning.   Yes [provider]  XARELTO 20 MG TABS tablet Take 20 mg daily by mouth. 01/31/17  Yes [provider]    Physical Exam: BP (!) 128/58 (BP Location: Right Arm)   Pulse 93   Temp 98.4 F (36.9 C) (Oral)   Resp (!) 21   Ht 5\' 3"  (1.6 m)   Wt 119.7 kg (264 lb)   SpO2 100%   BMI 46.77 kg/m   General: Alert and oriented 3, fatigued Eyes: Sclera nonicteric, extraocular movements are intact  ENT: Normocephalic, atraumatic, mucous membranes are slightly dry  Neck: Supple, no JVD  Cardiovascular: Regular rate and rhythm, S1-S2  Respiratory: Clear to auscultation bilaterally  Abdomen: Soft, non-tender, nondistended, positive bowel sounds  Skin: No skin tears or lesions  Musculoskeletal: No clubbing or cyanosis, trace pitting edema  Psychiatric: Patient is appropriate, no evidence of psychoses  Neurologic: No focal deficits           Labs on Admission:  Basic Metabolic Panel: Recent Labs  Lab 02/19/17 0812 02/23/17 0144  NA 137 130*  K 4.0 4.8  CL  --  95*  CO2 25 22  GLUCOSE 161* 228*  BUN 6.3* 12  CREATININE 0.8 0.94  CALCIUM 9.6 8.8*   Liver Function Tests: Recent Labs  Lab 02/19/17 0812 02/23/17 0144  AST 19 35  ALT 17 22  ALKPHOS 148 145*  BILITOT 0.66 1.9*  PROT 7.1 6.9  ALBUMIN 3.0* 3.1*   No results for input(s): LIPASE, AMYLASE in the last 168 hours. No results for input(s): AMMONIA in the last 168 hours. CBC: Recent Labs  Lab 02/19/17 0813 02/23/17 0144  WBC 6.8 19.2*  NEUTROABS 4.1 16.8*  HGB 11.1* 12.2  HCT 35.3 36.8  MCV 90.3 86.6  PLT 182 184   Cardiac Enzymes: No results for input(s): CKTOTAL, CKMB, CKMBINDEX, TROPONINI in the last 168 hours.  BNP (last 3 results) No results for input(s): BNP in the last 8760 hours.  ProBNP (last 3  results) No results for input(s): PROBNP in the last 8760 hours.  CBG: No results for input(s): GLUCAP in the last 168 hours.  Radiological Exams on Admission: Dg Chest Port 1 View  Result Date: 02/23/2017 CLINICAL DATA:  Shortness of breath. Weakness. History of pancreatic cancer on active chemotherapy. EXAM: PORTABLE CHEST 1 VIEW COMPARISON:  Radiograph 02/05/2017, CT 12/06/2016 FINDINGS: Right chest port with tip in the mid SVC. Lower lung volumes from prior exam. Unchanged heart size and mediastinal contours allowing for differences in technique. Streaky left basilar atelectasis. No confluent consolidation. Small pulmonary nodule in the right lung on prior exam is not seen currently, may be obscured. No pulmonary edema, large pleural effusion or pneumothorax. IMPRESSION: Low lung volumes with streaky left basilar atelectasis. Electronically Signed   By: Jeb Levering M.D.   On: 02/23/2017 02:13    EKG: Independently reviewed. Sinus tachycardia   Assessment/Plan Present on Admission: . Pancreatic carcinoma metastatic to liver [facility]): Discussed with oncology. Patient receiving chemotherapy and has only had limited effect. . Diastolic heart failure, stage B ([place]): Currently appears euvolemic to try. We'll only give gentle IV fluids, but I am hesitant to aggressively hydrate. Not convinced sepsis. Plus do not want to volume overload if it can be avoided. . Benign essential HTN: Blood pressure stable . Dyslipidemia . Obesity, Class III, BMI 40-49.9 (morbid obesity) [facility]): Patient meets criteria with BMI greater than 40 .  Fever: Principal problem. Discussed with oncology. No obvious source of infection. Chest x-ray and urinalysis unrevealing. This could be tumor fever versus chemotherapy fever also. Both should resolve within the next few days. Her leukocytosis can be explained that she was given a dose of Decadron in the office earlier this week. Would continue to follow white count. Blood  cultures have been drawn. I am hesitant to give her additional antibiotics beyond what she received in the emergency room unless infection presents. She is not neutropenic.  History of right cephalic vein thrombosis: Started on xarelto 3 months ago  Principal Problem:   Fever Active Problems:   Obesity, Class III, BMI 40-49.9 (morbid obesity) ([facility])   Dyslipidemia   Diastolic heart failure, stage B ([facility])   Benign essential HTN   Pancreatic carcinoma metastatic to liver ([facility])   DVT prophylaxis: On Xarelto   Code Status: Presumed full code for now. Patient states that she needs to discuss this with her husband   Family Communication: Left message for husband   Disposition Plan: We'll be here for several days until presence of infection confirmed and if so stabilized and treatment begun   Consults called: Discussed with Dr.Sherill, oncology   Admission status:  given acute illness and need for monitoring and services past two midnights, , admit as inpatient    Annita Brod MD [facility] Pager [phone]  If 7PM-7AM, please contact night-coverage [email or web address] Password [email or web address]  02/23/2017, 3:23 PM

## 2017-02-23 NOTE — ED Notes (Signed)
Attempted to call report to 5E, [facility] advised patient to be assigned to a different floor. Charge nurse aware. Plan of care will be reviewed with patient and family.

## 2017-02-23 NOTE — Progress Notes (Signed)
A consult was received from an ED physician for zosyn/vancomycin per pharmacy dosing.  The patient's profile has been reviewed for ht/wt/allergies/indication/available labs.   A one time order has been placed for zosyn 3.375 Gm and Vancomycin 2500mg .  Further antibiotics/pharmacy consults should be ordered by admitting physician if indicated.                       Thank you, Dorrene German 02/23/2017  3:06 AM

## 2017-02-23 NOTE — ED Notes (Signed)
ED TO INPATIENT HANDOFF REPORT  Name/Age/Gender Heather Frederick 61 y.o. female  Code Status    Code Status Orders  (From admission, onward)        Start     Ordered   02/23/17 1523  Full code  Continuous     02/23/17 1523    Code Status History    Date Active Date Inactive Code Status Order ID Comments User Context   02/05/2017 15:35 02/06/2017 16:23 Full Code [id number]  [name], MD Inpatient   06/30/2016 11:03 07/01/2016 13:30 Full Code [id number]  Jackolyn Confer, MD Inpatient   05/06/2016 23:18 05/07/2016 21:01 Full Code [id number]  Etta Quill, DO ED   06/11/2014 18:28 06/12/2014 13:25 Full Code [id number]  Jackolyn Confer, MD Inpatient   03/10/2014 14:31 03/10/2014 21:12 Full Code [id number]  Belva Crome, MD Inpatient   09/10/2012 11:38 09/12/2012 17:57 Full Code [id number]  Garald Balding, MD Inpatient      Home/SNF/Other Home  Chief Complaint ABD PAIN   Level of Care/Admitting Diagnosis ED Disposition    ED Disposition Condition [facility] Area: [facility] [[id number]]  Level of Care: Telemetry [5]  Admit to tele based on following criteria: Other see comments  Comments: sepsis  Diagnosis: Sepsis [facility]) [[id number]]  Admitting Physician: Ivor Costa [[id number]]  Attending Physician: Ivor Costa [phone]  Estimated length of stay: past midnight tomorrow  Certification:: I certify this patient will need inpatient services for at least 2 midnights  PT Class (Do Not Modify): Inpatient [101]  PT Acc Code (Do Not Modify): Private [1]       Medical History Past Medical History:  Diagnosis Date  . Allergy   . Anxiety   . Arthritis   . Benign essential HTN 09/23/2014  . Cancer ([place])   . Chronic kidney disease    uti  . Depression   . Dyslipidemia   . Elevated liver enzymes   . Family history of anesthesia complication    father has a severe hard time waking up  . Gallstones    a. Seen on CT 01/2014.  Marland Kitchen GERD (gastroesophageal reflux  disease)   . Hard of hearing   . Hepatic steatosis   . History of frequent urinary tract infections   . Hyperlipidemia   . Hypertension   . Lateral epicondylitis of right elbow   . Mental disorder   . Meralgia paresthetica of right side 10/02/2012   slight at 05/2014  . Migraine headache   . Obesity   . OSA (obstructive sleep apnea)    severe with AHI 37/hr now on CPAP at 18cm H2O  . Osteoarthritis   . Pneumonia    Feb 2018  . Raynaud disease    in feet per patient   . Sepsis ([place]) 02/23/2017  . Sleep apnea    wears c-pap  . Urinary tract infection     Allergies Allergies  Allergen Reactions  . Topamax [Topiramate] Other (See Comments)    :Stroke like symptoms  . Aleve [Naproxen Sodium] Hives    Has tolerated Voltaren topical as well as aspirin.  . Bee Venom Swelling  . Echinacea Hives  . Other Other (See Comments)    Feathers cause sinus congestion  . Sulfa Antibiotics Hives  . Advil [Ibuprofen] Hives    Has tolerated Voltaren topical as well as aspirin.    IV Location/Drains/Wounds Patient Lines/Drains/Airways Status   Active Line/Drains/Airways    Name:   Placement date:  Placement time:   Site:   Days:   Implanted Port 08/28/16 Right Chest   08/28/16    1058    Chest   179   Peripheral IV 02/23/17 Left Hand   02/23/17    0216    Hand   less than 1   Closed System Drain 1 Left;Lateral Knee Accordion (Hemovac)   09/10/12    -    Knee   1627   Incision 09/10/12 Knee Left   09/10/12    0905     1627   Incision (Closed) 06/11/14 Abdomen Other (Comment)   06/11/14    1255     988   Incision (Closed) 06/30/16 Abdomen Other (Comment)   06/30/16    0847     238   Incision - 4 Ports Abdomen 1: Left;Medial 2: Right;Medial 3: Right;Lateral 4: Umbilicus   08/67/61    9509     988   Incision - 3 Ports Abdomen 1: Right 2: Left;Lower 3: Left;Upper   06/30/16    0811     238          Labs/Imaging Results for orders placed or performed during the hospital encounter of  02/23/17 (from the past 48 hour(s))  Comprehensive metabolic panel     Status: Abnormal   Collection Time: 02/23/17  1:44 AM  Result Value Ref Range   Sodium 130 (L) 135 - 145 mmol/L   Potassium 4.8 3.5 - 5.1 mmol/L   Chloride 95 (L) 101 - 111 mmol/L   CO2 22 22 - 32 mmol/L   Glucose, Bld 228 (H) 65 - 99 mg/dL   BUN 12 6 - 20 mg/dL   Creatinine, Ser 0.94 0.44 - 1.00 mg/dL   Calcium 8.8 (L) 8.9 - 10.3 mg/dL   Total Protein 6.9 6.5 - 8.1 g/dL   Albumin 3.1 (L) 3.5 - 5.0 g/dL   AST 35 15 - 41 U/L   ALT 22 14 - 54 U/L   Alkaline Phosphatase 145 (H) 38 - 126 U/L   Total Bilirubin 1.9 (H) 0.3 - 1.2 mg/dL   GFR calc non Af Amer >60 >60 mL/min   GFR calc Af Amer >60 >60 mL/min    Comment: (NOTE) The eGFR has been calculated using the CKD EPI equation. This calculation has not been validated in all clinical situations. eGFR's persistently <60 mL/min signify possible Chronic Kidney Disease.    Anion gap 13 5 - 15  CBC WITH DIFFERENTIAL     Status: Abnormal   Collection Time: 02/23/17  1:44 AM  Result Value Ref Range   WBC 19.2 (H) 4.0 - 10.5 K/uL   RBC 4.25 3.87 - 5.11 MIL/uL   Hemoglobin 12.2 12.0 - 15.0 g/dL   HCT 36.8 36.0 - 46.0 %   MCV 86.6 78.0 - 100.0 fL   MCH 28.7 26.0 - 34.0 pg   MCHC 33.2 30.0 - 36.0 g/dL   RDW 15.3 11.5 - 15.5 %   Platelets 184 150 - 400 K/uL   Neutrophils Relative % 88 %   Neutro Abs 16.8 (H) 1.7 - 7.7 K/uL   Lymphocytes Relative 7 %   Lymphs Abs 1.4 0.7 - 4.0 K/uL   Monocytes Relative 5 %   Monocytes Absolute 1.0 0.1 - 1.0 K/uL   Eosinophils Relative 0 %   Eosinophils Absolute 0.1 0.0 - 0.7 K/uL   Basophils Relative 0 %   Basophils Absolute 0.0 0.0 - 0.1 K/uL  I-Stat CG4 Lactic  Acid, ED  (not at  [facility])     Status: None   Collection Time: 02/23/17  1:49 AM  Result Value Ref Range   Lactic Acid, Venous 1.70 0.5 - 1.9 mmol/L  Urinalysis, Routine w reflex microscopic     Status: Abnormal   Collection Time: 02/23/17  3:35 AM  Result Value Ref  Range   Color, Urine YELLOW YELLOW   APPearance CLEAR CLEAR   Specific Gravity, Urine 1.008 1.005 - 1.030   pH 7.0 5.0 - 8.0   Glucose, UA NEGATIVE NEGATIVE mg/dL   Hgb urine dipstick MODERATE (A) NEGATIVE   Bilirubin Urine NEGATIVE NEGATIVE   Ketones, ur 5 (A) NEGATIVE mg/dL   Protein, ur 30 (A) NEGATIVE mg/dL   Nitrite NEGATIVE NEGATIVE   Leukocytes, UA TRACE (A) NEGATIVE   RBC / HPF 6-30 0 - 5 RBC/hpf   WBC, UA 6-30 0 - 5 WBC/hpf   Bacteria, UA RARE (A) NONE SEEN   Squamous Epithelial / LPF 0-5 (A) NONE SEEN  I-Stat CG4 Lactic Acid, ED  (not at  [facility])     Status: None   Collection Time: 02/23/17  6:26 AM  Result Value Ref Range   Lactic Acid, Venous 1.16 0.5 - 1.9 mmol/L  CBG monitoring, ED     Status: Abnormal   Collection Time: 02/23/17  4:57 PM  Result Value Ref Range   Glucose-Capillary 135 (H) 65 - 99 mg/dL   Dg Chest Port 1 View  Result Date: 02/23/2017 CLINICAL DATA:  Shortness of breath. Weakness. History of pancreatic cancer on active chemotherapy. EXAM: PORTABLE CHEST 1 VIEW COMPARISON:  Radiograph 02/05/2017, CT 12/06/2016 FINDINGS: Right chest port with tip in the mid SVC. Lower lung volumes from prior exam. Unchanged heart size and mediastinal contours allowing for differences in technique. Streaky left basilar atelectasis. No confluent consolidation. Small pulmonary nodule in the right lung on prior exam is not seen currently, may be obscured. No pulmonary edema, large pleural effusion or pneumothorax. IMPRESSION: Low lung volumes with streaky left basilar atelectasis. Electronically Signed   By: Jeb Levering M.D.   On: 02/23/2017 02:13    Pending Labs Unresulted Labs (From admission, onward)   Start     Ordered   02/24/17 0500  CBC  Tomorrow morning,   R     02/23/17 1523   02/24/17 6553  Basic metabolic panel  Tomorrow morning,   R     02/23/17 1523   02/23/17 0134  Blood Culture (routine x 2)  BLOOD CULTURE X 2,   STAT     02/23/17 0133   02/23/17 0134   Urine culture  STAT,   STAT     02/23/17 0133      Vitals/Pain Today's Vitals   02/23/17 1414 02/23/17 1555 02/23/17 1658 02/23/17 1745  BP:  (!) 113/49 (!) 119/49   Pulse:  87 89   Resp:  (!) 21 18   Temp:      TempSrc:      SpO2:  100% 96%   Weight:      Height:      PainSc: _0 Isolation Precautions No active isolations  Medications Medications  acetaminophen (TYLENOL) tablet 650 mg (not administered)  0.9 %  sodium chloride infusion ( Intravenous Hold 02/23/17 0635)  aspirin EC tablet 81 mg (81 mg Oral Given 02/23/17 1410)  citalopram (CELEXA) tablet 20 mg (20 mg Oral Given 02/23/17 1409)  cyclobenzaprine (FLEXERIL)  tablet 5 mg (5 mg Oral Given 02/23/17 1419)  hydrOXYzine (ATARAX/VISTARIL) tablet 25 mg (not administered)  HYDROmorphone (DILAUDID) tablet 4-8 mg (not administered)  insulin detemir (LEVEMIR) injection 10 Units (not administered)  LORazepam (ATIVAN) tablet 0.5 mg (not administered)  pregabalin (LYRICA) capsule 200 mg (200 mg Oral Given 02/23/17 1410)  oxybutynin (DITROPAN-XL) 24 hr tablet 10 mg (10 mg Oral Given 02/23/17 1410)  oxyCODONE (Oxy IR/ROXICODONE) immediate release tablet 5 mg (5 mg Oral Given 02/23/17 1252)  pantoprazole (PROTONIX) EC tablet 40 mg (40 mg Oral Given 02/23/17 1410)  polyethylene glycol (MIRALAX / GLYCOLAX) packet 17 g (17 g Oral Refused 02/23/17 1411)  famotidine (PEPCID) tablet 40 mg (not administered)  cyanocobalamin 500 mcg, vitamin B-12 (CYANOCOBALAMIN) 2,000 mcg (2,500 mcg Oral Given 02/23/17 1555)  rivaroxaban (XARELTO) tablet 20 mg (20 mg Oral Given 02/23/17 1410)  ondansetron (ZOFRAN) tablet 4 mg (not administered)    Or  ondansetron (ZOFRAN) injection 4 mg (not administered)  0.9 %  sodium chloride infusion (50 mLs Intravenous New Bag/Given 02/23/17 1730)  insulin aspart (novoLOG) injection 0-9 Units (not administered)  insulin aspart (novoLOG) injection 0-5 Units (not administered)  sodium chloride 0.9 %  bolus 1,000 mL (0 mLs Intravenous Stopped 02/23/17 0300)    And  sodium chloride 0.9 % bolus 1,000 mL (0 mLs Intravenous Stopped 02/23/17 0241)    And  sodium chloride 0.9 % bolus 1,000 mL (0 mLs Intravenous Stopped 02/23/17 0411)    And  sodium chloride 0.9 % bolus 1,000 mL (0 mLs Intravenous Stopped 02/23/17 0411)  piperacillin-tazobactam (ZOSYN) IVPB 3.375 g (0 g Intravenous Stopped 02/23/17 0237)  acetaminophen (TYLENOL) tablet 650 mg (650 mg Oral Given 02/23/17 0217)  ondansetron (ZOFRAN) injection 4 mg (4 mg Intravenous Given 02/23/17 0148)  fentaNYL (SUBLIMAZE) injection 25 mcg (25 mcg Intravenous Given 02/23/17 0148)  vancomycin (VANCOCIN) 2,500 mg in sodium chloride 0.9 % 500 mL IVPB (0 mg Intravenous Stopped 02/23/17 0421)    Mobility walks with person assist

## 2017-02-23 NOTE — ED Notes (Signed)
ED Provider at bedside. 

## 2017-02-23 NOTE — ED Provider Notes (Signed)
[facility] DEPT Provider Note   CSN: [id number] Arrival date & time: 02/23/17  0053     History   Chief Complaint Chief Complaint  Patient presents with  . Weakness  . Nausea    HPI Heather Frederick is a 61 y.o. female.  The history is provided by the patient and the spouse.  Weakness  This is a new problem. The current episode started 12 to 24 hours ago. The problem has been gradually worsening. There was no focality noted. The maximum temperature recorded prior to her arrival was 102 to 102.9 F. Associated symptoms include shortness of breath. Pertinent negatives include no chest pain, no vomiting, no altered mental status and no headaches.   Patient with history of metastatic pancreatic cancer, currently on chemotherapy, also has history of hypertension, also has a history of chronic kidney disease, presents with nausea, generalized weakness over the past 12-24 hours. No vomiting, no diarrhea Husband reports he checked her temperature at home it was over 102 earlier in the day Reports shortness of breath and  no cough She also reports mild diffuse abdominal pain There is no headache reported Past Medical History:  Diagnosis Date  . Allergy   . Anxiety   . Arthritis   . Benign essential HTN 09/23/2014  . Cancer ([place])   . Chronic kidney disease    uti  . Depression   . Dyslipidemia   . Elevated liver enzymes   . Family history of anesthesia complication    father has a severe hard time waking up  . Gallstones    a. Seen on CT 01/2014.  Marland Kitchen GERD (gastroesophageal reflux disease)   . Hard of hearing   . Hepatic steatosis   . History of frequent urinary tract infections   . Hyperlipidemia   . Hypertension   . Lateral epicondylitis of right elbow   . Mental disorder   . Meralgia paresthetica of right side 10/02/2012   slight at 05/2014  . Migraine headache   . Obesity   . OSA (obstructive sleep apnea)    severe with AHI 37/hr now on CPAP  at 18cm H2O  . Osteoarthritis   . Pneumonia    Feb 2018  . Raynaud disease    in feet per patient   . Sleep apnea    wears c-pap  . Urinary tract infection     Patient Active Problem List   Diagnosis Date Noted  . Dizziness 02/05/2017  . Portacath in place 10/18/2016  . Goals of care, counseling/discussion 09/08/2016  . Pancreatic carcinoma metastatic to liver ([place]) 08/29/2016  . Mid back pain 07/07/2016  . Incisional hernia 06/30/2016  . Edema extremities 03/07/2016  . Pain of upper abdomen 01/27/2016  . Depression 01/28/2015  . Bile duct stone   . Elevated LFTs   . Benign essential HTN 09/23/2014  . Routine general medical examination at a health care facility 08/01/2014  . OSA (obstructive sleep apnea) 07/04/2014  . Dyslipidemia 03/09/2014  . Diastolic heart failure, stage B ([place])   . GERD (gastroesophageal reflux disease)   . Meralgia paresthetica of right side 10/02/2012  . Osteoarthritis of left knee 09/12/2012  . Migraines 09/12/2012  . Severe obesity (BMI >= 40) ([place]) 09/12/2012    Past Surgical History:  Procedure Laterality Date  . ABDOMINAL HYSTERECTOMY  1/04   partial  . BLADDER SUSPENSION  6/10  . CARDIAC CATHETERIZATION    . carpel tunnel left/right  7/08, 8/08 Bilateral 8/08 and  7/08  . carpel tunnel rel Right 4/12  . CHOLECYSTECTOMY N/A 06/11/2014   Procedure: LAPAROSCOPIC CHOLECYSTECTOMY WITH ATTEMPTED INTRAOPERATIVE CHOLANGIOGRAM;  Surgeon: Jackolyn Confer, MD;  Location: [facility];  Service: General;  Laterality: N/A;  . COLONOSCOPY    . ERCP N/A 10/19/2014   Procedure: ENDOSCOPIC RETROGRADE CHOLANGIOPANCREATOGRAPHY (ERCP);  Surgeon: Ladene Artist, MD;  Location: [facility];  Service: Endoscopy;  Laterality: N/A;  . INCISIONAL HERNIA REPAIR N/A 06/30/2016   Procedure: LAPAROSCOPIC REPAIR OF INCISIONAL HERNIA WITH MESH;  Surgeon: Jackolyn Confer, MD;  Location: [facility];  Service: General;  Laterality: N/A;  . INSERTION OF MESH N/A 06/30/2016    Procedure: INSERTION OF MESH;  Surgeon: Jackolyn Confer, MD;  Location: [facility];  Service: General;  Laterality: N/A;  . IR CV LINE INJECTION  11/24/2016  . IR FLUORO GUIDE PORT INSERTION RIGHT  08/28/2016  . IR US GUIDE VASC ACCESS RIGHT  08/28/2016  . JOINT REPLACEMENT    . KNEE ARTHROSCOPY Left 12/12  . KNEE ARTHROSCOPY Right 12/06  . LEFT HEART CATHETERIZATION WITH CORONARY ANGIOGRAM N/A 03/10/2014   Procedure: LEFT HEART CATHETERIZATION WITH CORONARY ANGIOGRAM;  Surgeon: Sinclair Grooms, MD;  Location: [facility];  Service: Cardiovascular;  Laterality: N/A;  . PLANTAR FASCIA RELEASE Right 12/10  . radial tunnel release     right arm   . ROTATOR CUFF REPAIR Left 6/11  . tennis elbow release Right 7/04  . TOTAL KNEE ARTHROPLASTY Left 09/10/2012   Procedure: TOTAL KNEE ARTHROPLASTY- left;  Surgeon: Garald Balding, MD;  Location: [facility];  Service: Orthopedics;  Laterality: Left;  Left total knee arthroplasty    OB History    No data available       Home Medications    Prior to Admission medications   Medication Sig Start Date End Date Taking? Authorizing Provider  acetaminophen (TYLENOL) 650 MG CR tablet Take 1,300 mg by mouth every 8 (eight) hours as needed for pain.    [provider]  aspirin EC 81 MG tablet Take 81 mg by mouth daily.    [provider]  citalopram (CELEXA) 20 MG tablet TAKE ONE (1) TABLET BY MOUTH EVERY DAY 01/22/17   Hoyt Koch, MD  Cranberry-Vitamin C-Vitamin E (CRANBERRY PLUS VITAMIN C PO) Take 1 tablet by mouth daily. 4200 mg    [provider]  cyclobenzaprine (FLEXERIL) 5 MG tablet Take 1 tablet (5 mg total) 3 (three) times daily as needed by mouth for muscle spasms. 02/02/17   Tanner, Lyndon Code., PA-C  eletriptan (RELPAX) 40 MG tablet Take 1 tablet (40 mg total) by mouth every 2 (two) hours as needed for migraine. 06/26/16   Dennie Bible, NP  HYDROmorphone (DILAUDID) 4 MG tablet Take 1-2 tablets (4-8 mg total) every  4 (four) hours as needed by mouth for severe pain. 02/08/17   Ladell Pier, MD  insulin aspart (NOVOLOG) 100 UNIT/ML injection Inject 0-9 Units 3 (three) times daily with meals into the skin. 02/06/17   [name], MD  insulin detemir (LEVEMIR) 100 UNIT/ML injection Inject 0.1 mLs (10 Units total) at bedtime into the skin. 02/06/17   [name], MD  insulin lispro (HUMALOG) 100 UNIT/ML injection Inject into the skin 3 (three) times daily before meals. Only takes when glucose is "very high"    [provider]  lidocaine-prilocaine (EMLA) cream Apply 1 application topically as needed. Apply to [facility] a Cath site one hour prior to needle stick. 08/29/16  Ladell Pier, MD  loratadine (CLARITIN) 10 MG tablet Take 10-20 mg by mouth 2 (two) times daily as needed for allergies.     [provider]  LORazepam (ATIVAN) 0.5 MG tablet Take 1 tablet (0.5 mg total) by mouth every 8 (eight) hours as needed (for nausea). 02/19/17   Ladell Pier, MD  LYRICA 200 MG capsule TAKE ONE (1) CAPSULE BY MOUTH EVERY MORNING 12/18/16   Dennie Bible, NP  nitroGLYCERIN (NITROSTAT) 0.4 MG SL tablet Place 1 tablet (0.4 mg total) under the tongue every 5 (five) minutes as needed for chest pain. 06/28/15   Belva Crome, MD  nystatin (MYCOSTATIN) 100000 UNIT/ML suspension Take 5 mLs (500,000 Units total) 4 (four) times daily by mouth. 02/06/17   [name], MD  ondansetron (ZOFRAN-ODT) 8 MG disintegrating tablet Take 1 tablet (8 mg total) by mouth every 8 (eight) hours as needed for nausea or vomiting. Patient not taking: Reported on 02/05/2017 09/18/16   Maryanna Shape, NP  oxybutynin (DITROPAN-XL) 10 MG 24 hr tablet Take 10 mg by mouth every morning.  05/15/13   [provider]  oxyCODONE (OXY IR/ROXICODONE) 5 MG immediate release tablet Take 1 tablet (5 mg total) every 4 (four) hours as needed by mouth for severe pain. 02/02/17   Tanner, Lyndon Code., PA-C  pantoprazole (PROTONIX) 40 MG tablet  TAKE ONE (1) TABLET BY MOUTH TWO (2) TIMES DAILY 09/25/16   Ladene Artist, MD  polyethylene glycol [facility] Floria Raveling) packet Take 17 g by mouth daily.    [provider]  ranitidine (ZANTAC) 300 MG tablet TAKE ONE TABLET BY MOUTH EVERY EVENING 01/20/16   Ladene Artist, MD  XARELTO 20 MG TABS tablet Take 20 mg daily by mouth. 01/31/17   [provider]    Family History Family History  Problem Relation Age of Onset  . Hypertension Mother   . Stroke Mother   . Liver disease Mother        Abcess  . Hypertension Father   . Coronary artery disease Father   . Migraines Father   . Clotting disorder Father   . Kidney failure Brother   . Hypertension Brother   . Migraines Brother   . Migraines Daughter   . Breast cancer Other        Niece with breast cancer  . Colon cancer Paternal Grandmother   . Pancreatic cancer Paternal Grandmother   . Stomach cancer Paternal Grandmother   . Breast cancer Cousin   . Esophageal cancer Neg Hx   . Rectal cancer Neg Hx     Social History Social History   Tobacco Use  . Smoking status: Never Smoker  . Smokeless tobacco: Never Used  . Tobacco comment: Secondhand - from family growing up, workplace intermittently  Substance Use Topics  . Alcohol use: Yes    Alcohol/week: 0.0 oz    Comment: occasionally - intermittent, no more than twice a week  . Drug use: No     Allergies   Topamax [topiramate]; Aleve [naproxen sodium]; Bee venom; Echinacea; Other; Sulfa antibiotics; and Advil [ibuprofen]   Review of Systems Review of Systems  Constitutional: Positive for fatigue and fever.  Respiratory: Positive for shortness of breath.   Cardiovascular: Negative for chest pain.  Gastrointestinal: Negative for vomiting.  Genitourinary: Negative for dysuria.  Neurological: Positive for weakness. Negative for headaches.  All other systems reviewed and are negative.    Physical Exam Updated Vital Signs BP 104/65 (  BP Location:  Left Arm)   Pulse (!) 128   Temp 98.4 F (36.9 C) (Oral)   Resp 19   Ht 1.6 m (5\' 3" )   Wt 119.7 kg (264 lb)   SpO2 99%   BMI 46.77 kg/m   Physical Exam  CONSTITUTIONAL: Chronically ill-appearing, distress noted, appears older than stated age HEAD: Normocephalic/atraumatic EYES: EOMI/PERRL ENMT: Mucous membranes dry NECK: supple no meningeal signs SPINE/BACK:entire spine nontender CV: S1/S2 noted, no murmurs/rubs/gallops noted, tachycardic LUNGS: Lungs are clear to auscultation bilaterally, no apparent distress ABDOMEN: soft, mild diffuse tenderness, no rebound or guarding, bowel sounds noted throughout abdomen, obese GU:no cva tenderness NEURO: Pt is awake/alert/appropriate, moves all extremitiesx4.  No facial droop.   EXTREMITIES: pulses normal/equal, full ROM SKIN: warm, color normal, IV port in right chest PSYCH: no abnormalities of mood noted, alert and oriented to situation  ED Treatments / Results  Labs (all labs ordered are listed, but only abnormal results are displayed) Labs Reviewed  COMPREHENSIVE METABOLIC PANEL - Abnormal; Notable for the following components:      Result Value   Sodium 130 (*)    Chloride 95 (*)    Glucose, Bld 228 (*)    Calcium 8.8 (*)    Albumin 3.1 (*)    Alkaline Phosphatase 145 (*)    Total Bilirubin 1.9 (*)    All other components within normal limits  CBC WITH DIFFERENTIAL/PLATELET - Abnormal; Notable for the following components:   WBC 19.2 (*)    Neutro Abs 16.8 (*)    All other components within normal limits  URINALYSIS, ROUTINE W REFLEX MICROSCOPIC - Abnormal; Notable for the following components:   Hgb urine dipstick MODERATE (*)    Ketones, ur 5 (*)    Protein, ur 30 (*)    Leukocytes, UA TRACE (*)    Bacteria, UA RARE (*)    Squamous Epithelial / LPF 0-5 (*)    All other components within normal limits  CULTURE, BLOOD (ROUTINE X 2)  CULTURE, BLOOD (ROUTINE X 2)  URINE CULTURE  I-STAT CG4 LACTIC ACID, ED  I-STAT  CG4 LACTIC ACID, ED  I-STAT CG4 LACTIC ACID, ED    EKG  EKG Interpretation  Date/Time:  Friday February 23 2017 00:55:40 EST Ventricular Rate:  127 PR Interval:    QRS Duration: 71 QT Interval:  295 QTC Calculation: 429 R Axis:   67 Text Interpretation:  Sinus tachycardia Abnormal R-wave progression, early transition No significant change since last tracing Confirmed by Ripley Fraise ([id number]) on 02/23/2017 2:21:55 AM       Radiology Dg Chest Port 1 View  Result Date: 02/23/2017 CLINICAL DATA:  Shortness of breath. Weakness. History of pancreatic cancer on active chemotherapy. EXAM: PORTABLE CHEST 1 VIEW COMPARISON:  Radiograph 02/05/2017, CT 12/06/2016 FINDINGS: Right chest port with tip in the mid SVC. Lower lung volumes from prior exam. Unchanged heart size and mediastinal contours allowing for differences in technique. Streaky left basilar atelectasis. No confluent consolidation. Small pulmonary nodule in the right lung on prior exam is not seen currently, may be obscured. No pulmonary edema, large pleural effusion or pneumothorax. IMPRESSION: Low lung volumes with streaky left basilar atelectasis. Electronically Signed   By: Jeb Levering M.D.   On: 02/23/2017 02:13    Procedures Procedures  CRITICAL CARE Performed by: Sharyon Cable Total critical care time: 30 minutes Critical care time was exclusive of separately billable procedures and treating other patients. Critical care was necessary to treat or prevent  imminent or life-threatening deterioration. Critical care was time spent personally by me on the following activities: development of treatment plan with patient and/or surrogate as well as nursing, discussions with consultants, evaluation of patient's response to treatment, examination of patient, obtaining history from patient or surrogate, ordering and performing treatments and interventions, ordering and review of laboratory studies, ordering and review of  radiographic studies, pulse oximetry and re-evaluation of patient's condition. Patient to be admitted for concern for sepsis, she was tachycardic heart rate over 120 and required over 3 L of IV fluid Medications Ordered in ED Medications  ondansetron (ZOFRAN) injection 4 mg (not administered)  acetaminophen (TYLENOL) tablet 650 mg (not administered)  0.9 %  sodium chloride infusion ( Intravenous Hold 02/23/17 0635)  sodium chloride 0.9 % bolus 1,000 mL (0 mLs Intravenous Stopped 02/23/17 0300)    And  sodium chloride 0.9 % bolus 1,000 mL (0 mLs Intravenous Stopped 02/23/17 0241)    And  sodium chloride 0.9 % bolus 1,000 mL (1,000 mLs Intravenous New Bag/Given 02/23/17 0341)    And  sodium chloride 0.9 % bolus 1,000 mL (0 mLs Intravenous Stopped 02/23/17 0411)  piperacillin-tazobactam (ZOSYN) IVPB 3.375 g (0 g Intravenous Stopped 02/23/17 0237)  acetaminophen (TYLENOL) tablet 650 mg (650 mg Oral Given 02/23/17 0217)  ondansetron (ZOFRAN) injection 4 mg (4 mg Intravenous Given 02/23/17 0148)  fentaNYL (SUBLIMAZE) injection 25 mcg (25 mcg Intravenous Given 02/23/17 0148)  vancomycin (VANCOCIN) 2,500 mg in sodium chloride 0.9 % 500 mL IVPB (0 mg Intravenous Stopped 02/23/17 0421)     Initial Impression / Assessment and Plan / ED Course  I have reviewed the triage vital signs and the nursing notes.  Pertinent labs & imaging results that were available during my care of the patient were reviewed by me and considered in my medical decision making (see chart for details).     2:58 AM After fluids were started patient started to improve She is awake and alert, heart rate and blood pressure are improving She reports her abdominal pain is resolved Workup Pending 6:54 AM Patient monitored for several hours in the emergency department Vitals are beginning to improve, repeat lactate negative She reports her abdominal pain is improved Suspect mild sepsis however unclear cause, as u/a and CXR  negative She has tolerated IV fluids well  She reports not feeling at baseline, will admit for monitoring, IV fluids and blood cultures pending  D/w dr Blaine Hamper for admission  Final Clinical Impressions(s) / ED Diagnoses   Final diagnoses:  Sepsis, due to unspecified organism [facility])    ED Discharge Orders    None       Ripley Fraise, MD 02/23/17 [phone]

## 2017-02-23 NOTE — ED Notes (Signed)
Bed: [facility]85 Expected date:  Expected time:  Means of arrival:  Comments: EMS 61 yo female with pancreatic cancer weakness from chemo/nausea

## 2017-02-23 NOTE — ED Triage Notes (Signed)
Per [facility] pt from home, worsening weakness and nausea after chemo this week. 20G L hand, 4mg  zofran and 100CC given en route

## 2017-02-23 NOTE — Progress Notes (Signed)
This is a no charge note  Pending admission per Dr. Christy Gentles  61 year old lady with past medical history of hypertension, hyperlipidemia, GERD, depression, anxiety, pancreatic cancer metastasized to liver, morbid obesity, who presents with generalized weakness after chemotherapy. Patient also has fever of 102. Patient is clinically septic with fever, leukocytosis, tachycardia and tachypnea. Urinalysis with trace amount of leukocyte. Chest x-rays negative for infiltration. Patient was started with vancomycin and Zosyn. Patient is admitted to telemetry bed as inpatient.   Ivor Costa, MD  [facility] Pager [phone]  If 7PM-7AM, please contact night-coverage [email or web address] Password [facility] 02/23/2017, 6:36 AM

## 2017-02-24 DIAGNOSIS — R509 Fever, unspecified: Secondary | ICD-10-CM

## 2017-02-24 DIAGNOSIS — A419 Sepsis, unspecified organism: Secondary | ICD-10-CM | POA: Diagnosis present

## 2017-02-24 LAB — BASIC METABOLIC PANEL
Anion gap: 6 (ref 5–15)
BUN: 9 mg/dL (ref 6–20)
CALCIUM: 8.5 mg/dL — AB (ref 8.9–10.3)
CO2: 27 mmol/L (ref 22–32)
Chloride: 102 mmol/L (ref 101–111)
Creatinine, Ser: 0.68 mg/dL (ref 0.44–1.00)
GFR calc Af Amer: >60 mL/min (ref >60)
GLUCOSE: 132 mg/dL — AB (ref 65–99)
Potassium: 3.5 mmol/L (ref 3.5–5.1)
Sodium: 135 mmol/L (ref 135–145)

## 2017-02-24 LAB — URINE CULTURE: Culture: NO GROWTH

## 2017-02-24 LAB — GLUCOSE, CAPILLARY
Glucose-Capillary: 113 mg/dL — ABNORMAL HIGH (ref 65–99)
Glucose-Capillary: 197 mg/dL — ABNORMAL HIGH (ref 65–99)

## 2017-02-24 LAB — CBC
HCT: 31.2 % — ABNORMAL LOW (ref 36.0–46.0)
Hemoglobin: 9.9 g/dL — ABNORMAL LOW (ref 12.0–15.0)
MCH: 28.4 pg (ref 26.0–34.0)
MCHC: 31.7 g/dL (ref 30.0–36.0)
MCV: 89.4 fL (ref 78.0–100.0)
PLATELETS: 110 10*3/uL — AB (ref 150–400)
RBC: 3.49 MIL/uL — AB (ref 3.87–5.11)
RDW: 15.2 % (ref 11.5–15.5)
WBC: 7.7 10*3/uL (ref 4.0–10.5)

## 2017-02-24 MED ORDER — POLYETHYLENE GLYCOL 3350 17 G PO PACK: 17.0000 g | PACK | Freq: Every day | ORAL | 0 refills | Status: DC | PRN

## 2017-02-24 MED ORDER — HEPARIN SOD (PORK) LOCK FLUSH 100 UNIT/ML IV SOLN
500.0000 [IU] | INTRAVENOUS | Status: AC | PRN
  Administered 2017-02-24: 500 [IU]

## 2017-02-24 MED ORDER — SODIUM CHLORIDE 0.9% FLUSH
10.0000 mL | INTRAVENOUS | Status: DC | PRN
  Administered 2017-02-24 (×2): 10 mL
  Filled 2017-02-24 (×2): qty 40

## 2017-02-24 NOTE — Discharge Instructions (Addendum)
Nausea, Adult Feeling sick to your stomach (nausea) means that your stomach is upset or you feel like you have to throw up (vomit). Feeling sick to your stomach is usually not serious, but it may be an early sign of a more serious medical problem. As you feel sicker to your stomach, it can lead to throwing up (vomiting). If you throw up, or if you are not able to drink enough fluids, there is a risk of dehydration. Dehydration can make you feel tired and thirsty, have a dry mouth, and pee (urinate) less often. Older adults and people who have other diseases or a weak defense (immune) system have a higher risk of dehydration. The main goal of treating this condition is to:  Limit how often you feel sick to your stomach.  Prevent throwing up and dehydration.  Follow these instructions at home: Follow instructions from your doctor about how to care for yourself at home. Eating and drinking Follow these recommendations as told by your doctor:  Take an oral rehydration solution (ORS). This is a drink that is sold at pharmacies and stores.  Drink clear fluids in small amounts as you are able, such as: ? Water. ? Ice chips. ? Fruit juice that has water added (diluted fruit juice). ? Low-calorie sports drinks.  Eat bland, easy to digest foods in small amounts as you are able, such as: ? Bananas. ? Applesauce. ? Rice. ? Lean meats. ? Toast. ? Crackers.  Avoid drinking fluids that contain a lot of sugar or caffeine.  Avoid alcohol.  Avoid spicy or fatty foods.  General instructions  Drink enough fluid to keep your pee (urine) clear or pale yellow.  Wash your hands often. If you cannot use soap and water, use hand sanitizer.  Make sure that all people in your household wash their hands well and often.  Rest at home while you get better.  Take over-the-counter and prescription medicines only as told by your doctor.  Breathe slowly and deeply when you feel sick to your  stomach.  Watch your condition for any changes.  Keep all follow-up visits as told by your doctor. This is important. Contact a doctor if:  You have a headache.  You have new symptoms.  You feel sicker to your stomach.  You have a fever.  You feel light-headed or dizzy.  You throw up.  You are not able to keep fluids down. Get help right away if:  You have pain in your chest, neck, arm, or jaw.  You feel very weak or you pass out (faint).  You have throw up that is bright red or looks like coffee grounds.  You have bloody or black poop (stools), or poop that looks like tar.  You have a very bad headache, a stiff neck, or both.  You have very bad pain, cramping, or bloating in your belly.  You have a rash.  You have trouble breathing or you are breathing very quickly.  Your heart is beating very quickly.  Your skin feels cold and clammy.  You feel confused.  You have pain while peeing.  You have signs of dehydration, such as: ? Dark pee, or very little or no pee. ? Cracked lips. ? Dry mouth. ? Sunken eyes. ? Sleepiness. ? Weakness. These symptoms may be an emergency. Do not wait to see if the symptoms will go away. Get medical help right away. Call your local emergency services (911 in the [place] Do not drive yourself to   the hospital. This information is not intended to replace advice given to you by your health care provider. Make sure you discuss any questions you have with your health care provider. Document Released: 03/02/2011 Document Revised: 08/19/2015 Document Reviewed: 11/17/2014 Elsevier Interactive Patient Education  2018 [employer]  

## 2017-02-24 NOTE — Discharge Summary (Signed)
Physician Discharge Summary  Heather Frederick BHA:[id number] DOB: 05/29/1955 DOA: 02/23/2017  PCP: Heather Gravel, MD  Admit date: 02/23/2017 Discharge date: 02/24/2017  Recommendations for Outpatient Follow-up:  1. Resume home meds 2. Follow up with oncology   Discharge Diagnoses:  Principal Problem:   Fever Active Problems:   Obesity, Class III, BMI 40-49.9 (morbid obesity) (HCC)   Dyslipidemia   Diastolic heart failure, stage B (HCC)   Benign essential HTN   Pancreatic carcinoma metastatic to liver (HCC)   Chronic anticoagulation    Discharge Condition: stable   Diet recommendation: as tolerated   History of present illness:   Per HPI "61 y.o. female with past medical history significant for metastatic pancreatic cancer, diastolic heart failure and recent cephalic vein thrombosis on xarelto who has been on aggressive chemotherapy with only limited results and was hospitalized overnight 2 weeks ago for nausea and vomiting. At that time, she did not have fever and there were no reports of infection. Patient just received chemotherapy again this past Monday. At that time, she was complaining of some mild dysuria although her urinalysis done in the office was unremarkable and urine culture was negative. Patient was given a dose of Decadron at that time. She continued to feel worse and checked her temperature at home noted to have fever as high as 102.9. She stated she felt a little short of breath. She had no dysuria, nausea/vomiting, abdominal pain, change in stool habits. She then came into the emergency room on the early morning of 11/30 for further evaluation. In the emergency room, patient afebrile, but noted to be borderline tachycardic initially. Her lab work was noteworthy for a CBG of 228, white blood cell count of 19.2 with 88% shift and lactic acid level within normal range 1.7. Chest x-ray only noted low lung volumes with some atelectasis. Urinalysis unremarkable. Patient had  blood cultures drawn and then given a dose of broad-spectrum antibiotics and some IV fluids. Hospitalist called for further evaluation and admission."   Hospital Course:   Principal Problem:   Fever - No obvious source of infection, CXR and UA unremarkable - Abx not started on admission - No fever since admission - Pt wants to go home, medically stable for discharge today  - Resume home meds   Active Problems:   Pancreatic carcinoma metastatic to liver [facility]) - Management per oncology     Right cephalic vein thrombosis - On xarelto     Signed:  Leisa Lenz, MD  [facility] 02/24/2017, 9:41 AM  Pager #: [phone]   Discharge Exam: Vitals:   02/23/17 2040 02/24/17 0630  BP: (!) 152/51 98/66  Pulse: (!) 108 78  Resp: 20 20  Temp: 99.9 F (37.7 C) (!) 97.5 F (36.4 C)  SpO2: 96% 96%   Vitals:   02/23/17 1658 02/23/17 1831 02/23/17 2040 02/24/17 0630  BP: (!) 119/49 98/70 (!) 152/51 98/66  Pulse: 89 89 (!) 108 78  Resp: 18 (!) 22 20 20   Temp:  98 F (36.7 C) 99.9 F (37.7 C) (!) 97.5 F (36.4 C)  TempSrc:  Oral Oral Oral  SpO2: 96% 100% 96% 96%  Weight:  120.6 kg (265 lb 14 oz)    Height:  5\' 3"  (1.6 m)      General: Pt is alert, follows commands appropriately, not in acute distress Cardiovascular: Regular rate and rhythm, S1/S2 + Respiratory: Clear to auscultation bilaterally, no wheezing, no crackles, no rhonchi Abdominal: Soft, non tender, non distended, bowel sounds +,  no guarding Extremities: no edema, no cyanosis, pulses palpable bilaterally DP and PT Neuro: Grossly nonfocal  Discharge Instructions  Discharge Instructions    Call MD for:  persistant nausea and vomiting   Complete by:  As directed    Call MD for:  redness, tenderness, or signs of infection (pain, swelling, redness, odor or green/yellow discharge around incision site)   Complete by:  As directed    Call MD for:  severe uncontrolled pain   Complete by:  As directed     Diet - low sodium heart healthy   Complete by:  As directed    Increase activity slowly   Complete by:  As directed       Follow-up Information    Heather Gravel, MD. Schedule an appointment as soon as possible for a visit in 1 week(s).   Specialty:  Internal Medicine Contact information: [address] [place] [address] [phone]            The results of significant diagnostics from this hospitalization (including imaging, microbiology, ancillary and laboratory) are listed below for reference.    Significant Diagnostic Studies: Dg Chest 2 View  Result Date: 02/05/2017 CLINICAL DATA:  Cough with nausea and vomiting. Pancreatic carcinoma EXAM: CHEST  2 VIEW COMPARISON:  Chest CT 12/06/2016.  Chest x-ray 06/26/2016 FINDINGS: Heart size and vascularity normal. Negative for pneumonia. Left lower lobe scar/ atelectasis. Negative for pleural effusion 8 mm right middle lobe nodule as noted on CT. Possible metastatic disease. No other lung nodule. Port-A-Cath tip in the SVC IMPRESSION: 8 mm right middle lobe nodule unchanged from recent CT. Possible metastatic disease. Electronically Signed   By: Franchot Gallo M.D.   On: 02/05/2017 17:02   Mr Jeri Cos BT Contrast  Result Date: 02/05/2017 CLINICAL DATA:  Pancreas neoplasm, here for staging. History of hypertension, hyperlipidemia. EXAM: MRI HEAD WITHOUT AND WITH CONTRAST TECHNIQUE: Multiplanar, multiecho pulse sequences of the brain and surrounding structures were obtained without and with intravenous contrast. Patient could not tolerate further imaging and axial T2 sequence not obtained. CONTRAST:  3mL MULTIHANCE GADOBENATE DIMEGLUMINE 529 MG/ML IV SOLN COMPARISON:  Neither images for report from prior MRI of the brain July 04, 2011 available at time of study interpretation. FINDINGS: Moderately motion degraded examination, fast sequences utilized. INTRACRANIAL CONTENTS: No reduced diffusion to suggest acute ischemia or  hypercellular tumor. No susceptibility artifact to suggest hemorrhage. The ventricles and sulci are normal for patient's age, cavum septum pellucidum et vergae. No suspicious parenchymal signal, masses, mass effect. No abnormal intraparenchymal or extra-axial enhancement. No abnormal extra-axial fluid collections. No extra-axial masses. VASCULAR: Normal major intracranial vascular flow voids present at skull base. SKULL AND UPPER CERVICAL SPINE: No abnormal sellar expansion. No suspicious calvarial bone marrow signal. Craniocervical junction maintained. SINUSES/ORBITS: The mastoid air-cells and included paranasal sinuses are well-aerated.The included ocular globes and orbital contents are non-suspicious. OTHER: None. IMPRESSION: 1. Negative moderately motion degraded MRI of the head with and without contrast for age. Electronically Signed   By: Elon Alas M.D.   On: 02/05/2017 23:06   Dg Chest Port 1 View  Result Date: 02/23/2017 CLINICAL DATA:  Shortness of breath. Weakness. History of pancreatic cancer on active chemotherapy. EXAM: PORTABLE CHEST 1 VIEW COMPARISON:  Radiograph 02/05/2017, CT 12/06/2016 FINDINGS: Right chest port with tip in the mid SVC. Lower lung volumes from prior exam. Unchanged heart size and mediastinal contours allowing for differences in technique. Streaky left basilar atelectasis. No confluent consolidation. Small  pulmonary nodule in the right lung on prior exam is not seen currently, may be obscured. No pulmonary edema, large pleural effusion or pneumothorax. IMPRESSION: Low lung volumes with streaky left basilar atelectasis. Electronically Signed   By: Jeb Levering M.D.   On: 02/23/2017 02:13    Microbiology: Recent Results (from the past 240 hour(s))  Urine Culture     Status: None   Collection Time: 02/19/17 10:10 AM  Result Value Ref Range Status   Urine Culture, Routine Final report  Final   Organism ID, Bacteria Comment  Final    Comment: Culture shows less  than 10,000 colony forming units of bacteria per milliliter of urine. This colony count is not generally considered to be clinically significant.   Urine culture     Status: None   Collection Time: 02/23/17  3:35 AM  Result Value Ref Range Status   Specimen Description URINE, RANDOM  Final   Special Requests NONE  Final   Culture   Final    NO GROWTH Performed at [facility], [address] [place], [place] [address]    Report Status 02/24/2017 FINAL  Final     Labs: Basic Metabolic Panel: Recent Labs  Lab 02/19/17 0812 02/23/17 0144 02/24/17 0632  NA 137 130* 135  K 4.0 4.8 3.5  CL  --  95* 102  CO2 25 22 27   GLUCOSE 161* 228* 132*  BUN 6.3* 12 9  CREATININE 0.8 0.94 0.68  CALCIUM 9.6 8.8* 8.5*   Liver Function Tests: Recent Labs  Lab 02/19/17 0812 02/23/17 0144  AST 19 35  ALT 17 22  ALKPHOS 148 145*  BILITOT 0.66 1.9*  PROT 7.1 6.9  ALBUMIN 3.0* 3.1*   No results for input(s): LIPASE, AMYLASE in the last 168 hours. No results for input(s): AMMONIA in the last 168 hours. CBC: Recent Labs  Lab 02/19/17 0813 02/23/17 0144 02/24/17 0632  WBC 6.8 19.2* 7.7  NEUTROABS 4.1 16.8*  --   HGB 11.1* 12.2 9.9*  HCT 35.3 36.8 31.2*  MCV 90.3 86.6 89.4  PLT 182 184 110*   Cardiac Enzymes: No results for input(s): CKTOTAL, CKMB, CKMBINDEX, TROPONINI in the last 168 hours. BNP: BNP (last 3 results) No results for input(s): BNP in the last 8760 hours.  ProBNP (last 3 results) No results for input(s): PROBNP in the last 8760 hours.  CBG: Recent Labs  Lab 02/23/17 1657 02/23/17 2043 02/24/17 0807  GLUCAP 135* 241* 113*

## 2017-02-24 NOTE — Progress Notes (Signed)
Pt had lunch and tol well, pt will be discharged to home with husband. SRP, RN

## 2017-02-26 ENCOUNTER — Encounter: Payer: Self-pay | Admitting: Cardiology

## 2017-02-27 ENCOUNTER — Other Ambulatory Visit: Payer: Self-pay | Admitting: Nurse Practitioner

## 2017-02-27 ENCOUNTER — Ambulatory Visit (HOSPITAL_BASED_OUTPATIENT_CLINIC_OR_DEPARTMENT_OTHER): Payer: 59

## 2017-02-27 ENCOUNTER — Encounter: Payer: Self-pay | Admitting: Nurse Practitioner

## 2017-02-27 ENCOUNTER — Ambulatory Visit (HOSPITAL_BASED_OUTPATIENT_CLINIC_OR_DEPARTMENT_OTHER): Payer: 59 | Admitting: Nurse Practitioner

## 2017-02-27 VITALS — BP 88/59 | HR 81 | Temp 97.9°F | Resp 21 | Ht 63.0 in | Wt 262.4 lb

## 2017-02-27 VITALS — BP 115/47 | HR 84 | Temp 97.9°F | Resp 18

## 2017-02-27 DIAGNOSIS — G893 Neoplasm related pain (acute) (chronic): Secondary | ICD-10-CM | POA: Diagnosis not present

## 2017-02-27 DIAGNOSIS — E860 Dehydration: Secondary | ICD-10-CM

## 2017-02-27 DIAGNOSIS — C259 Malignant neoplasm of pancreas, unspecified: Secondary | ICD-10-CM

## 2017-02-27 DIAGNOSIS — C787 Secondary malignant neoplasm of liver and intrahepatic bile duct: Secondary | ICD-10-CM | POA: Diagnosis not present

## 2017-02-27 DIAGNOSIS — E119 Type 2 diabetes mellitus without complications: Secondary | ICD-10-CM | POA: Diagnosis not present

## 2017-02-27 DIAGNOSIS — C251 Malignant neoplasm of body of pancreas: Secondary | ICD-10-CM

## 2017-02-27 DIAGNOSIS — I10 Essential (primary) hypertension: Secondary | ICD-10-CM | POA: Diagnosis not present

## 2017-02-27 DIAGNOSIS — I959 Hypotension, unspecified: Secondary | ICD-10-CM

## 2017-02-27 DIAGNOSIS — Z95828 Presence of other vascular implants and grafts: Secondary | ICD-10-CM

## 2017-02-27 LAB — COMPREHENSIVE METABOLIC PANEL
ALT: 17 U/L (ref 0–55)
ANION GAP: 9 meq/L (ref 3–11)
AST: 20 U/L (ref 5–34)
Albumin: 2.7 g/dL — ABNORMAL LOW (ref 3.5–5.0)
Alkaline Phosphatase: 152 U/L — ABNORMAL HIGH (ref 40–150)
BILIRUBIN TOTAL: 0.57 mg/dL (ref 0.20–1.20)
BUN: 9.6 mg/dL (ref 7.0–26.0)
CHLORIDE: 101 meq/L (ref 98–109)
CO2: 24 meq/L (ref 22–29)
Calcium: 9.2 mg/dL (ref 8.4–10.4)
Creatinine: 0.8 mg/dL (ref 0.6–1.1)
Glucose: 147 mg/dl — ABNORMAL HIGH (ref 70–140)
Potassium: 3.8 mEq/L (ref 3.5–5.1)
Sodium: 133 mEq/L — ABNORMAL LOW (ref 136–145)
Total Protein: 6.9 g/dL (ref 6.4–8.3)

## 2017-02-27 LAB — CBC WITH DIFFERENTIAL/PLATELET
BASO%: 0 % (ref 0.0–2.0)
BASOS ABS: 0 10*3/uL (ref 0.0–0.1)
EOS ABS: 0.2 10*3/uL (ref 0.0–0.5)
EOS%: 2.4 % (ref 0.0–7.0)
HEMATOCRIT: 32.2 % — AB (ref 34.8–46.6)
HGB: 10.3 g/dL — ABNORMAL LOW (ref 11.6–15.9)
LYMPH#: 1.4 10*3/uL (ref 0.9–3.3)
LYMPH%: 18.4 % (ref 14.0–49.7)
MCH: 28 pg (ref 25.1–34.0)
MCHC: 32 g/dL (ref 31.5–36.0)
MCV: 87.5 fL (ref 79.5–101.0)
MONO#: 1 10*3/uL — ABNORMAL HIGH (ref 0.1–0.9)
MONO%: 13.5 % (ref 0.0–14.0)
NEUT%: 65.7 % (ref 38.4–76.8)
NEUTROS ABS: 5 10*3/uL (ref 1.5–6.5)
Platelets: 122 10*3/uL — ABNORMAL LOW (ref 145–400)
RBC: 3.68 10*6/uL — ABNORMAL LOW (ref 3.70–5.45)
RDW: 15.2 % — ABNORMAL HIGH (ref 11.2–14.5)
WBC: 7.7 10*3/uL (ref 3.9–10.3)

## 2017-02-27 MED ORDER — SODIUM CHLORIDE 0.9% FLUSH
10.0000 mL | INTRAVENOUS | Status: DC | PRN
  Administered 2017-02-27: 10 mL via INTRAVENOUS
  Filled 2017-02-27: qty 10

## 2017-02-27 MED ORDER — OXYCODONE-ACETAMINOPHEN 5-325 MG PO TABS
1.0000 | ORAL_TABLET | ORAL | Status: AC
  Administered 2017-02-27: 1 via ORAL

## 2017-02-27 MED ORDER — OXYCODONE HCL 5 MG PO TABS: 5.0000 mg | ORAL_TABLET | ORAL | 0 refills | Status: DC | PRN

## 2017-02-27 MED ORDER — OXYCODONE-ACETAMINOPHEN 5-325 MG PO TABS
ORAL_TABLET | ORAL | Status: AC
  Filled 2017-02-27: qty 1

## 2017-02-27 MED ORDER — SODIUM CHLORIDE 0.9 % IV SOLN
INTRAVENOUS | Status: AC
  Administered 2017-02-27: 12:00:00 via INTRAVENOUS

## 2017-02-27 MED ORDER — HEPARIN SOD (PORK) LOCK FLUSH 100 UNIT/ML IV SOLN
500.0000 [IU] | Freq: Once | INTRAVENOUS | Status: AC | PRN
  Administered 2017-02-27: 500 [IU] via INTRAVENOUS
  Filled 2017-02-27: qty 5

## 2017-02-27 MED FILL — oxyCODONE HCL 5 MG TABS: 5 | 8 days supply | Qty: 50 | Fill #0

## 2017-02-27 NOTE — Progress Notes (Addendum)
[facility] OFFICE PROGRESS NOTE   Diagnosis: Pancreas cancer  INTERVAL HISTORY:   Heather Frederick returns as scheduled.  She completed cycle 2 FOLFOX 02/19/2017.  She was hospitalized 02/23/2017 through 02/24/2017 with fever.  There was no obvious source of infection.  She had no fever during the hospitalization.  Antibiotics were not initiated.  She was discharged home.  She has had no further fever.  She continues to have intermittent nausea.  She continues to have right-sided abdominal and back pain.  Sometimes she notices pain at the left abdomen.  She also has "random" areas of abdominal pain.  Following the chemotherapy she had frequent formed stools.  Over the past several days she reports approximately 3 "mushy" stools a day.  She reports her mouth is dry.  She feels lightheaded and dizzy with standing.  She feels intake of liquids is adequate.  She complains of shortness of breath and "hyperventilating".  No chest pain.  Objective:  Vital signs in last 24 hours:  Blood pressure (!) 88/59, pulse 81, temperature 97.9 F (36.6 C), temperature source Oral, resp. rate (!) 21, height _0  (1.6 m), weight 262 lb 6.4 oz (119 kg), SpO2 97 %.    HEENT: No thrush or ulcers.  Mouth is dry appearing. Resp: Lungs clear bilaterally.  Cardio: Regular rate and rhythm. GI: Abdomen is soft.  No hepatomegaly.  No mass. Vascular: No leg edema. Port-A-Cath without erythema.  Lab Results:  Lab Results  Component Value Date   WBC 7.7 02/24/2017   HGB 9.9 (L) 02/24/2017   HCT 31.2 (L) 02/24/2017   MCV 89.4 02/24/2017   PLT 110 (L) 02/24/2017   NEUTROABS 16.8 (H) 02/23/2017    Imaging:  No results found.  Medications: I have reviewed the patient's current medications.  Assessment/Plan: 1. Metastatic pancreas cancer ? Pancreas body mass and liver metastases noted on CT abdomen/pelvis 08/11/2016 ? Ultrasound-guided biopsy of a right liver lesion 08/16/2016 revealed poorly  differentiated adenocarcinoma consistent with pancreas cancer ? Foundation 1-microsatellite stable; tumor mutational burden 1; ERBB2 amplification ? Cycle 1 gemcitabine/Abraxane 09/13/2016; 09/29/2016 ? Cycle 2 gemcitabine/Abraxane 10/11/2016, 10/18/2016 ? Cycle 3 gemcitabine/Abraxane 11/01/2016, 11/08/2016 ? Cycle 4 gemcitabine/Abraxane 11/23/2016,11/29/2016 ? CT chest 12/06/2016-liver lesions appear smaller ? Cycle 5 gemcitabine/Abraxane 12/13/2016 ? CT abdomen/pelvis 01/01/2017-new and enlarging hepatic masses. Enlarging pancreatic mass. ? Cycle 6 gemcitabine/Abraxane 01/03/2017 ? Rising CA 19-9, increased pain October 2018 ? Cycle 1 FOLFOX 01/31/2017 ? Cycle 2 FOLFOX 02/19/2017  2. Pain secondary to pancreas cancer  3. Hypertension  4. Sleep apnea  5. Chronic low back pain  6. Recurrent urinary tract infections  7. Depression  8. Migraine headaches  9. Port-A-Cath placement 08/28/2016  10. Rash following cycle 1 gemcitabine/Abraxane-drug rash?  11. Nausea/vomiting following cycle 1 gemcitabine/Abraxane-antiemetic regimen adjusted with addition of Aloxi  12. Diabetes  13. Right cephalic vein thrombosis 60/73/7106-[id number] with Xarelto  14. CT chest 12/06/2016 done to evaluate dyspnea-several 3-4 mm nodular opacities in the lung parenchyma etiology uncertain. Known mass in the body of the pancreas measuring 3.3 x 2.4 cm;small enhancing lesion in the anterior dome of the liver measures 8 mm.  15. Right forearm rash following cycle 5 day 8 gemcitabine/Abraxane. Resolved   Disposition: Ms. Tredway has completed 2 cycles of FOLFOX.  She has noted no improvement in abdominal pain.  She is scheduled for cycle 3 FOLFOX 03/06/2017.  She has a poor performance status.  We discussed the addition of irinotecan to the chemotherapy regimen.  We  reviewed potential toxicities.  It is not clear at this time if she is a candidate to add the irinotecan due to  her performance status.  At today's visit she is hypotensive and appears dehydrated.  We will obtain labs to include a CBC, chemistry panel and CA-19-9.  She will receive a liter of IV fluids.  She will follow-up as scheduled prior to chemotherapy 03/06/2017.  She will contact the office in the interim with any problems.  Patient seen with Dr. Benay Spice.  25 minutes were spent face-to-face at today's visit with the majority of that time involved in counseling/coordination of care.   Ned Card ANP/GNP-BC   02/27/2017  11:00 AM This was a shared visit with Ned Card.  Ms. Coverdale has completed 2 cycles of FOLFOX.  She continues to have abdominal pain.  Her performance status has declined.  She is not a candidate for FOLFIRINOX at present.  We checked a CA 19-9 today.  She will receive intravenous fluids today.  Julieanne Manson, MD

## 2017-02-27 NOTE — Patient Instructions (Signed)
Dehydration, Adult Dehydration is a condition in which there is not enough fluid or water in the body. This happens when you lose more fluids than you take in. Important organs, such as the kidneys, brain, and heart, cannot function without a proper amount of fluids. Any loss of fluids from the body can lead to dehydration. Dehydration can range from mild to severe. This condition should be treated right away to prevent it from becoming severe. What are the causes? This condition may be caused by:  Vomiting.  Diarrhea.  Excessive sweating, such as from heat exposure or exercise.  Not drinking enough fluid, especially: ? When ill. ? While doing activity that requires a lot of energy.  Excessive urination.  Fever.  Infection.  Certain medicines, such as medicines that cause the body to lose excess fluid (diuretics).  Inability to access safe drinking water.  Reduced physical ability to get adequate water and food.  What increases the risk? This condition is more likely to develop in people:  Who have a poorly controlled long-term (chronic) illness, such as diabetes, heart disease, or kidney disease.  Who are age 65 or older.  Who are disabled.  Who live in a place with high altitude.  Who play endurance sports.  What are the signs or symptoms? Symptoms of mild dehydration may include:  Thirst.  Dry lips.  Slightly dry mouth.  Dry, warm skin.  Dizziness. Symptoms of moderate dehydration may include:  Very dry mouth.  Muscle cramps.  Dark urine. Urine may be the color of tea.  Decreased urine production.  Decreased tear production.  Heartbeat that is irregular or faster than normal (palpitations).  Headache.  Light-headedness, especially when you stand up from a sitting position.  Fainting (syncope). Symptoms of severe dehydration may include:  Changes in skin, such as: ? Cold and clammy skin. ? Blotchy (mottled) or pale skin. ? Skin that does  not quickly return to normal after being lightly pinched and released (poor skin turgor).  Changes in body fluids, such as: ? Extreme thirst. ? No tear production. ? Inability to sweat when body temperature is high, such as in hot weather. ? Very little urine production.  Changes in vital signs, such as: ? Weak pulse. ? Pulse that is more than 100 beats a minute when sitting still. ? Rapid breathing. ? Low blood pressure.  Other changes, such as: ? Sunken eyes. ? Cold hands and feet. ? Confusion. ? Lack of energy (lethargy). ? Difficulty waking up from sleep. ? Short-term weight loss. ? Unconsciousness. How is this diagnosed? This condition is diagnosed based on your symptoms and a physical exam. Blood and urine tests may be done to help confirm the diagnosis. How is this treated? Treatment for this condition depends on the severity. Mild or moderate dehydration can often be treated at home. Treatment should be started right away. Do not wait until dehydration becomes severe. Severe dehydration is an emergency and it needs to be treated in a hospital. Treatment for mild dehydration may include:  Drinking more fluids.  Replacing salts and minerals in your blood (electrolytes) that you may have lost. Treatment for moderate dehydration may include:  Drinking an oral rehydration solution (ORS). This is a drink that helps you replace fluids and electrolytes (rehydrate). It can be found at pharmacies and retail stores. Treatment for severe dehydration may include:  Receiving fluids through an IV tube.  Receiving an electrolyte solution through a feeding tube that is passed through your nose   and into your stomach (nasogastric tube, or NG tube).  Correcting any abnormalities in electrolytes.  Treating the underlying cause of dehydration. Follow these instructions at home:  If directed by your health care provider, drink an ORS: ? Make an ORS by following instructions on the  package. ? Start by drinking small amounts, about  cup (120 mL) every 5-10 minutes. ? Slowly increase how much you drink until you have taken the amount recommended by your health care provider.  Drink enough clear fluid to keep your urine clear or pale yellow. If you were told to drink an ORS, finish the ORS first, then start slowly drinking other clear fluids. Drink fluids such as: ? Water. Do not drink only water. Doing that can lead to having too little salt (sodium) in the body (hyponatremia). ? Ice chips. ? Fruit juice that you have added water to (diluted fruit juice). ? Low-calorie sports drinks.  Avoid: ? Alcohol. ? Drinks that contain a lot of sugar. These include high-calorie sports drinks, fruit juice that is not diluted, and soda. ? Caffeine. ? Foods that are greasy or contain a lot of fat or sugar.  Take over-the-counter and prescription medicines only as told by your health care provider.  Do not take sodium tablets. This can lead to having too much sodium in the body (hypernatremia).  Eat foods that contain a healthy balance of electrolytes, such as bananas, oranges, potatoes, tomatoes, and spinach.  Keep all follow-up visits as told by your health care provider. This is important. Contact a health care provider if:  You have abdominal pain that: ? Gets worse. ? Stays in one area (localizes).  You have a rash.  You have a stiff neck.  You are more irritable than usual.  You are sleepier or more difficult to wake up than usual.  You feel weak or dizzy.  You feel very thirsty.  You have urinated only a small amount of very dark urine over 6-8 hours. Get help right away if:  You have symptoms of severe dehydration.  You cannot drink fluids without vomiting.  Your symptoms get worse with treatment.  You have a fever.  You have a severe headache.  You have vomiting or diarrhea that: ? Gets worse. ? Does not go away.  You have blood or green matter  (bile) in your vomit.  You have blood in your stool. This may cause stool to look black and tarry.  You have not urinated in 6-8 hours.  You faint.  Your heart rate while sitting still is over 100 beats a minute.  You have trouble breathing. This information is not intended to replace advice given to you by your health care provider. Make sure you discuss any questions you have with your health care provider. Document Released: 03/13/2005 Document Revised: 10/08/2015 Document Reviewed: 05/07/2015 Elsevier Interactive Patient Education  2018 [employer]  

## 2017-02-28 LAB — CULTURE, BLOOD (ROUTINE X 2)
CULTURE: NO GROWTH
CULTURE: NO GROWTH
SPECIAL REQUESTS: ADEQUATE
SPECIAL REQUESTS: ADEQUATE

## 2017-02-28 LAB — CANCER ANTIGEN 19-9: CA 19-9: 5551 U/mL — ABNORMAL HIGH (ref 0–35)

## 2017-03-03 ENCOUNTER — Other Ambulatory Visit: Payer: Self-pay | Admitting: Gastroenterology

## 2017-03-06 ENCOUNTER — Ambulatory Visit: Payer: 59

## 2017-03-06 ENCOUNTER — Ambulatory Visit (HOSPITAL_BASED_OUTPATIENT_CLINIC_OR_DEPARTMENT_OTHER): Payer: 59

## 2017-03-06 ENCOUNTER — Other Ambulatory Visit (HOSPITAL_BASED_OUTPATIENT_CLINIC_OR_DEPARTMENT_OTHER): Payer: 59

## 2017-03-06 ENCOUNTER — Encounter: Payer: Self-pay | Admitting: Nurse Practitioner

## 2017-03-06 ENCOUNTER — Telehealth: Payer: Self-pay | Admitting: Oncology

## 2017-03-06 ENCOUNTER — Ambulatory Visit (HOSPITAL_BASED_OUTPATIENT_CLINIC_OR_DEPARTMENT_OTHER): Payer: 59 | Admitting: Nurse Practitioner

## 2017-03-06 VITALS — BP 103/46 | HR 85 | Temp 98.2°F | Resp 16

## 2017-03-06 VITALS — BP 93/52 | HR 95 | Temp 97.7°F | Resp 18 | Ht 63.0 in | Wt 258.8 lb

## 2017-03-06 DIAGNOSIS — C259 Malignant neoplasm of pancreas, unspecified: Secondary | ICD-10-CM

## 2017-03-06 DIAGNOSIS — C787 Secondary malignant neoplasm of liver and intrahepatic bile duct: Principal | ICD-10-CM

## 2017-03-06 DIAGNOSIS — Z452 Encounter for adjustment and management of vascular access device: Secondary | ICD-10-CM

## 2017-03-06 DIAGNOSIS — C251 Malignant neoplasm of body of pancreas: Secondary | ICD-10-CM

## 2017-03-06 DIAGNOSIS — G893 Neoplasm related pain (acute) (chronic): Secondary | ICD-10-CM | POA: Diagnosis not present

## 2017-03-06 DIAGNOSIS — Z95828 Presence of other vascular implants and grafts: Secondary | ICD-10-CM

## 2017-03-06 DIAGNOSIS — I10 Essential (primary) hypertension: Secondary | ICD-10-CM | POA: Diagnosis not present

## 2017-03-06 DIAGNOSIS — Z5111 Encounter for antineoplastic chemotherapy: Secondary | ICD-10-CM

## 2017-03-06 LAB — CBC WITH DIFFERENTIAL/PLATELET
BASO%: 1.5 % (ref 0.0–2.0)
Basophils Absolute: 0.1 10*3/uL (ref 0.0–0.1)
EOS ABS: 0.2 10*3/uL (ref 0.0–0.5)
EOS%: 4 % (ref 0.0–7.0)
HCT: 32.1 % — ABNORMAL LOW (ref 34.8–46.6)
HEMOGLOBIN: 10.1 g/dL — AB (ref 11.6–15.9)
LYMPH#: 1 10*3/uL (ref 0.9–3.3)
LYMPH%: 23.9 % (ref 14.0–49.7)
MCH: 27.4 pg (ref 25.1–34.0)
MCHC: 31.5 g/dL (ref 31.5–36.0)
MCV: 86.8 fL (ref 79.5–101.0)
MONO#: 0.7 10*3/uL (ref 0.1–0.9)
MONO%: 17.7 % — AB (ref 0.0–14.0)
NEUT%: 52.9 % (ref 38.4–76.8)
NEUTROS ABS: 2.2 10*3/uL (ref 1.5–6.5)
Platelets: 203 10*3/uL (ref 145–400)
RBC: 3.7 10*6/uL (ref 3.70–5.45)
RDW: 16.7 % — AB (ref 11.2–14.5)
WBC: 4.2 10*3/uL (ref 3.9–10.3)

## 2017-03-06 LAB — COMPREHENSIVE METABOLIC PANEL
ALT: 21 U/L (ref 0–55)
ANION GAP: 11 meq/L (ref 3–11)
AST: 26 U/L (ref 5–34)
Albumin: 2.7 g/dL — ABNORMAL LOW (ref 3.5–5.0)
Alkaline Phosphatase: 155 U/L — ABNORMAL HIGH (ref 40–150)
BILIRUBIN TOTAL: 0.44 mg/dL (ref 0.20–1.20)
BUN: 7.9 mg/dL (ref 7.0–26.0)
CALCIUM: 9.2 mg/dL (ref 8.4–10.4)
CHLORIDE: 101 meq/L (ref 98–109)
CO2: 23 mEq/L (ref 22–29)
CREATININE: 0.9 mg/dL (ref 0.6–1.1)
Glucose: 238 mg/dl — ABNORMAL HIGH (ref 70–140)
Potassium: 4.2 mEq/L (ref 3.5–5.1)
Sodium: 135 mEq/L — ABNORMAL LOW (ref 136–145)
Total Protein: 6.8 g/dL (ref 6.4–8.3)

## 2017-03-06 MED ORDER — OXYBUTYNIN CHLORIDE ER 10 MG PO TB24: 10.0000 mg | ORAL_TABLET | Freq: Every morning | ORAL | 2 refills | Status: DC

## 2017-03-06 MED ORDER — ATROPINE SULFATE 1 MG/ML IJ SOLN
INTRAMUSCULAR | Status: AC
  Filled 2017-03-06: qty 1

## 2017-03-06 MED ORDER — LEUCOVORIN CALCIUM INJECTION 350 MG
300.0000 mg/m2 | Freq: Once | INTRAVENOUS | Status: AC
  Administered 2017-03-06: 460 mg via INTRAVENOUS
  Filled 2017-03-06: qty 23

## 2017-03-06 MED ORDER — PALONOSETRON HCL INJECTION 0.25 MG/5ML
0.2500 mg | Freq: Once | INTRAVENOUS | Status: AC
  Administered 2017-03-06: 0.25 mg via INTRAVENOUS

## 2017-03-06 MED ORDER — FLUOROURACIL CHEMO INJECTION 5 GM/100ML
2400.0000 mg/m2 | INTRAVENOUS | Status: DC
  Administered 2017-03-06: 3650 mg via INTRAVENOUS
  Filled 2017-03-06: qty 73

## 2017-03-06 MED ORDER — DEXTROSE 5 % IV SOLN
125.0000 mg/m2 | Freq: Once | INTRAVENOUS | Status: AC
  Administered 2017-03-06: 200 mg via INTRAVENOUS
  Filled 2017-03-06: qty 10

## 2017-03-06 MED ORDER — SODIUM CHLORIDE 0.9% FLUSH
10.0000 mL | INTRAVENOUS | Status: DC | PRN
  Administered 2017-03-06: 10 mL via INTRAVENOUS
  Filled 2017-03-06: qty 10

## 2017-03-06 MED ORDER — OXALIPLATIN CHEMO INJECTION 100 MG/20ML
85.0000 mg/m2 | Freq: Once | INTRAVENOUS | Status: AC
  Administered 2017-03-06: 130 mg via INTRAVENOUS
  Filled 2017-03-06: qty 6

## 2017-03-06 MED ORDER — SODIUM CHLORIDE 0.9 % IV SOLN
Freq: Once | INTRAVENOUS | Status: AC
  Administered 2017-03-06: 14:00:00 via INTRAVENOUS
  Filled 2017-03-06: qty 5

## 2017-03-06 MED ORDER — ATROPINE SULFATE 1 MG/ML IJ SOLN
0.5000 mg | Freq: Once | INTRAMUSCULAR | Status: AC | PRN
  Administered 2017-03-06: 0.5 mg via INTRAVENOUS

## 2017-03-06 MED ORDER — PALONOSETRON HCL INJECTION 0.25 MG/5ML
INTRAVENOUS | Status: AC
  Filled 2017-03-06: qty 5

## 2017-03-06 MED ORDER — DEXTROSE 5 % IV SOLN
Freq: Once | INTRAVENOUS | Status: AC
  Administered 2017-03-06: 13:00:00 via INTRAVENOUS

## 2017-03-06 NOTE — Telephone Encounter (Signed)
Scheduled appt per 12/11 - unable to schedule for 12/19 and 12/26 - logged due to capped day - will contact the patient when appt is scheduled.

## 2017-03-06 NOTE — Progress Notes (Signed)
Nutrition Assessment   Reason for Assessment:   Patient identified on Malnutrition Screening Tool for weight loss   ASSESSMENT:  61 year old female with metastatic pancreatic cancer.  Past medical history of HTN, sleep apnea, UTI, DM. Noted recent hospital admission.    Met with patient and husband during infusion this pm.  Patient reports that appetite has been decreased since starting chemotherapy(beginning of November 2018).  Patient reports no issues with nausea, some abdominal pain, no diarrhea, some constipation.  Reports will drink V-8 juice and ensure original for breakfast then usually skips lunch and eats dinner that husband prepares (meat, vegetables, starch).  Reports blood glucose is running in the 100s.     Nutrition Focused Physical Exam: deferred  Medications: humalog, MVI, zofran, miralax, Vit B 12  Labs: Na 135, glucose 238  Anthropometrics:   Height: 63 inches Weight: 258 lb 12.8 oz UBW: Noted in October 280s lb BMI: 45  8% weight loss in the last 2 months, significant   Estimated Energy Needs  Kcals: 2500-2900 calories/d Protein: 78-104 g/d Fluid: > 2.5 L/d  NUTRITION DIAGNOSIS:Severe malnutrition related to cancer and cancer related treatment side effects as evidenced by 8% weight loss in the last 2 months and eating < or equal to 50% of energy needs for > or equal to 5 days.   MALNUTRITION DIAGNOSIS: Patient meets criteria for severe malnutrition in context of acute illness but likely progressing to chronic illnes as evidenced by 8% weight loss in 2 months and eating < or equal to 50% of energy needs for > or equal to 5 days.   INTERVENTION:   Encouraged high calorie, high protein oral nutrition supplement and samples and coupons given today.  Encouraged small frequent meals Encouraged good sources of protein and "Soft moist protein" fact sheet given today Encouraged patient to keep check on blood glucose and notify MD if levels are out of range.   Would encouraged patient to liberalize diet at this time due to meeting criteria for severe malnutrition vs restricting diet to control blood glucose Contact information given to patient  MONITORING, EVALUATION, GOAL: Patient will consume adequate calories to prevent further weight loss   NEXT VISIT: Friday, December 28 during infusion  Armentha Branagan B. Zenia Resides, [place], [place] Registered Dietitian [phone] (pager)

## 2017-03-06 NOTE — Progress Notes (Signed)
MD using Ideal BSA = 1.53 m2 & dosing down on chemo. Kennith Center, Pharm.D., CPP 03/06/2017@1 :00 PM

## 2017-03-06 NOTE — Patient Instructions (Signed)
[facility] Discharge Instructions for Patients Receiving Chemotherapy  Today you received the following chemotherapy agents Oxaliplatin, Leucovorin, Irinotecan, and 5FU  To help prevent nausea and vomiting after your treatment, we encourage you to take your nausea medication as directed   If you develop nausea and vomiting that is not controlled by your nausea medication, call the clinic.   BELOW ARE SYMPTOMS THAT SHOULD BE REPORTED IMMEDIATELY:  *FEVER GREATER THAN 100.5 F  *CHILLS WITH OR WITHOUT FEVER  NAUSEA AND VOMITING THAT IS NOT CONTROLLED WITH YOUR NAUSEA MEDICATION  *UNUSUAL SHORTNESS OF BREATH  *UNUSUAL BRUISING OR BLEEDING  TENDERNESS IN MOUTH AND THROAT WITH OR WITHOUT PRESENCE OF ULCERS  *URINARY PROBLEMS  *BOWEL PROBLEMS  UNUSUAL RASH Items with * indicate a potential emergency and should be followed up as soon as possible.  Feel free to call the clinic should you have any questions or concerns. The clinic phone number is [phone].  Please show the [facility] at check-in to the Emergency Department and triage nurse.    Irinotecan injection What is this medicine? IRINOTECAN (ir in oh TEE kan ) is a chemotherapy drug. It is used to treat colon and rectal cancer. This medicine may be used for other purposes; ask your health care provider or pharmacist if you have questions. COMMON BRAND NAME(S): Camptosar What should I tell my health care provider before I take this medicine? They need to know if you have any of these conditions: -blood disorders -dehydration -diarrhea -infection (especially a virus infection such as chickenpox, cold sores, or herpes) -liver disease -low blood counts, like low white cell, platelet, or red cell counts -recent or ongoing radiation therapy -an unusual or allergic reaction to irinotecan, sorbitol, other chemotherapy, other medicines, foods, dyes, or preservatives -pregnant or trying to get  pregnant -breast-feeding How should I use this medicine? This drug is given as an infusion into a vein. It is administered in a hospital or clinic by a specially trained health care professional. Talk to your pediatrician regarding the use of this medicine in children. Special care may be needed. Overdosage: If you think you have taken too much of this medicine contact a poison control center or emergency room at once. NOTE: This medicine is only for you. Do not share this medicine with others. What if I miss a dose? It is important not to miss your dose. Call your doctor or health care professional if you are unable to keep an appointment. What may interact with this medicine? Do not take this medicine with any of the following medications: -atazanavir -certain medicines for fungal infections like itraconazole and ketoconazole -St. John's Wort This medicine may also interact with the following medications: -dexamethasone -diuretics -laxatives -medicines for seizures like carbamazepine, mephobarbital, phenobarbital, phenytoin, primidone -medicines to increase blood counts like filgrastim, pegfilgrastim, sargramostim -prochlorperazine -vaccines This list may not describe all possible interactions. Give your health care provider a list of all the medicines, herbs, non-prescription drugs, or dietary supplements you use. Also tell them if you smoke, drink alcohol, or use illegal drugs. Some items may interact with your medicine. What should I watch for while using this medicine? Your condition will be monitored carefully while you are receiving this medicine. You will need important blood work done while you are taking this medicine. This drug may make you feel generally unwell. This is not uncommon, as chemotherapy can affect healthy cells as well as cancer cells. Report any side effects. Continue your course  of treatment even though you feel ill unless your doctor tells you to stop. In some  cases, you may be given additional medicines to help with side effects. Follow all directions for their use. You may get drowsy or dizzy. Do not drive, use machinery, or do anything that needs mental alertness until you know how this medicine affects you. Do not stand or sit up quickly, especially if you are an older patient. This reduces the risk of dizzy or fainting spells. Call your doctor or health care professional for advice if you get a fever, chills or sore throat, or other symptoms of a cold or flu. Do not treat yourself. This drug decreases your body's ability to fight infections. Try to avoid being around people who are sick. This medicine may increase your risk to bruise or bleed. Call your doctor or health care professional if you notice any unusual bleeding. Be careful brushing and flossing your teeth or using a toothpick because you may get an infection or bleed more easily. If you have any dental work done, tell your dentist you are receiving this medicine. Avoid taking products that contain aspirin, acetaminophen, ibuprofen, naproxen, or ketoprofen unless instructed by your doctor. These medicines may hide a fever. Do not become pregnant while taking this medicine. Women should inform their doctor if they wish to become pregnant or think they might be pregnant. There is a potential for serious side effects to an unborn child. Talk to your health care professional or pharmacist for more information. Do not breast-feed an infant while taking this medicine. What side effects may I notice from receiving this medicine? Side effects that you should report to your doctor or health care professional as soon as possible: -allergic reactions like skin rash, itching or hives, swelling of the face, lips, or tongue -low blood counts - this medicine may decrease the number of white blood cells, red blood cells and platelets. You may be at increased risk for infections and bleeding. -signs of infection  - fever or chills, cough, sore throat, pain or difficulty passing urine -signs of decreased platelets or bleeding - bruising, pinpoint red spots on the skin, black, tarry stools, blood in the urine -signs of decreased red blood cells - unusually weak or tired, fainting spells, lightheadedness -breathing problems -chest pain -diarrhea -feeling faint or lightheaded, falls -flushing, runny nose, sweating during infusion -mouth sores or pain -pain, swelling, redness or irritation where injected -pain, swelling, warmth in the leg -pain, tingling, numbness in the hands or feet -problems with balance, talking, walking -stomach cramps, pain -trouble passing urine or change in the amount of urine -vomiting as to be unable to hold down drinks or food -yellowing of the eyes or skin Side effects that usually do not require medical attention (report to your doctor or health care professional if they continue or are bothersome): -constipation -hair loss -headache -loss of appetite -nausea, vomiting -stomach upset This list may not describe all possible side effects. Call your doctor for medical advice about side effects. You may report side effects to FDA at 1-800-FDA-1088. Where should I keep my medicine? This drug is given in a hospital or clinic and will not be stored at home. NOTE: This sheet is a summary. It may not cover all possible information. If you have questions about this medicine, talk to your doctor, pharmacist, or health care provider.  2018 Elsevier/Gold Standard (2012-09-09 16:29:32)

## 2017-03-06 NOTE — Patient Instructions (Signed)
Implanted Port Home Guide An implanted port is a type of central line that is placed under the skin. Central lines are used to provide IV access when treatment or nutrition needs to be given through a person's veins. Implanted ports are used for long-term IV access. An implanted port may be placed because:  You need IV medicine that would be irritating to the small veins in your hands or arms.  You need long-term IV medicines, such as antibiotics.  You need IV nutrition for a long period.  You need frequent blood draws for lab tests.  You need dialysis.  Implanted ports are usually placed in the chest area, but they can also be placed in the upper arm, the abdomen, or the leg. An implanted port has two main parts:  Reservoir. The reservoir is round and will appear as a small, raised area under your skin. The reservoir is the part where a needle is inserted to give medicines or draw blood.  Catheter. The catheter is a thin, flexible tube that extends from the reservoir. The catheter is placed into a large vein. Medicine that is inserted into the reservoir goes into the catheter and then into the vein.  How will I care for my incision site? Do not get the incision site wet. Bathe or shower as directed by your health care provider. How is my port accessed? Special steps must be taken to access the port:  Before the port is accessed, a numbing cream can be placed on the skin. This helps numb the skin over the port site.  Your health care provider uses a sterile technique to access the port. ? Your health care provider must put on a mask and sterile gloves. ? The skin over your port is cleaned carefully with an antiseptic and allowed to dry. ? The port is gently pinched between sterile gloves, and a needle is inserted into the port.  Only "non-coring" port needles should be used to access the port. Once the port is accessed, a blood return should be checked. This helps ensure that the port  is in the vein and is not clogged.  If your port needs to remain accessed for a constant infusion, a clear (transparent) bandage will be placed over the needle site. The bandage and needle will need to be changed every week, or as directed by your health care provider.  Keep the bandage covering the needle clean and dry. Do not get it wet. Follow your health care provider's instructions on how to take a shower or bath while the port is accessed.  If your port does not need to stay accessed, no bandage is needed over the port.  What is flushing? Flushing helps keep the port from getting clogged. Follow your health care provider's instructions on how and when to flush the port. Ports are usually flushed with saline solution or a medicine called heparin. The need for flushing will depend on how the port is used.  If the port is used for intermittent medicines or blood draws, the port will need to be flushed: ? After medicines have been given. ? After blood has been drawn. ? As part of routine maintenance.  If a constant infusion is running, the port may not need to be flushed.  How long will my port stay implanted? The port can stay in for as long as your health care provider thinks it is needed. When it is time for the port to come out, surgery will be   done to remove it. The procedure is similar to the one performed when the port was put in. When should I seek immediate medical care? When you have an implanted port, you should seek immediate medical care if:  You notice a bad smell coming from the incision site.  You have swelling, redness, or drainage at the incision site.  You have more swelling or pain at the port site or the surrounding area.  You have a fever that is not controlled with medicine.  This information is not intended to replace advice given to you by your health care provider. Make sure you discuss any questions you have with your health care provider. Document  Released: 03/13/2005 Document Revised: 08/19/2015 Document Reviewed: 11/18/2012 Elsevier Interactive Patient Education  2017 [employer].  

## 2017-03-06 NOTE — Progress Notes (Addendum)
[facility] OFFICE PROGRESS NOTE   Diagnosis: Pancreas cancer  INTERVAL HISTORY:   Heather Frederick returns as scheduled.  She completed cycle 2 FOLFOX 02/19/2017.  The CA-19-9 was increased 02/27/2017.  She overall is feeling better but continues to be "weak".  She notes improved pain control.  She takes oxycodone as needed.  She denies nausea.  No diarrhea.  Blood sugars have been well controlled.  Objective:  Vital signs in last 24 hours:  Blood pressure (!) 93/52, pulse 95, temperature 97.7 F (36.5 C), temperature source Oral, resp. rate 18, height _0  (1.6 m), weight 258 lb 12.8 oz (117.4 kg), SpO2 99 %.    HEENT: No thrush or ulcers. Resp: Lungs clear bilaterally. Cardio: Regular rate and rhythm. GI: Abdomen is soft.  No hepatomegaly.  No mass. Vascular: No leg edema. Port-A-Cath without erythema.   Lab Results:  Lab Results  Component Value Date   WBC 4.2 03/06/2017   HGB 10.1 (L) 03/06/2017   HCT 32.1 (L) 03/06/2017   MCV 86.8 03/06/2017   PLT 203 03/06/2017   NEUTROABS 2.2 03/06/2017    Imaging:  No results found.  Medications: I have reviewed the patient's current medications.  Assessment/Plan: 1. Metastatic pancreas cancer ? Pancreas body mass and liver metastases noted on CT abdomen/pelvis 08/11/2016 ? Ultrasound-guided biopsy of a right liver lesion 08/16/2016 revealed poorly differentiated adenocarcinoma consistent with pancreas cancer ? Foundation 1-microsatellite stable; tumor mutational burden 1; ERBB2 amplification ? Cycle 1 gemcitabine/Abraxane 09/13/2016; 09/29/2016 ? Cycle 2 gemcitabine/Abraxane 10/11/2016, 10/18/2016 ? Cycle 3 gemcitabine/Abraxane 11/01/2016, 11/08/2016 ? Cycle 4 gemcitabine/Abraxane 11/23/2016,11/29/2016 ? CT chest 12/06/2016-liver lesions appear smaller ? Cycle 5 gemcitabine/Abraxane 12/13/2016 ? CT abdomen/pelvis 01/01/2017-new and enlarging hepatic masses. Enlarging pancreatic mass. ? Cycle 6  gemcitabine/Abraxane 01/03/2017 ? Rising CA 19-9, increased pain October 2018 ? Cycle 1 FOLFOX 01/31/2017 ? Cycle 2 FOLFOX 02/19/2017 ? Cycle 3 FOLFIRINOX 03/06/2017  2. Pain secondary to pancreas cancer  3. Hypertension  4. Sleep apnea  5. Chronic low back pain  6. Recurrent urinary tract infections  7. Depression  8. Migraine headaches  9. Port-A-Cath placement 08/28/2016  10. Rash following cycle 1 gemcitabine/Abraxane-drug rash?  11. Nausea/vomiting following cycle 1 gemcitabine/Abraxane-antiemetic regimen adjusted with addition of Aloxi  12. Diabetes  13. Right cephalic vein thrombosis [id number] with Xarelto  14. CT chest 12/06/2016 done to evaluate dyspnea-several 3-4 mm nodular opacities in the lung parenchyma etiology uncertain. Known mass in the body of the pancreas measuring 3.3 x 2.4 cm;small enhancing lesion in the anterior dome of the liver measures 8 mm.  15. Right forearm rash following cycle 5 day 8 gemcitabine/Abraxane. Resolved  Disposition: Heather Frederick appears stable.  She has completed 2 cycles of FOLFOX.  She continues to have pain and the CA-19-9 from last week was higher.  Dr. Benay Spice recommends adding irinotecan to the regimen.  We again reviewed potential toxicities.  She agrees to proceed.  She will receive IV fluids on the day of pump discontinuation.  We will see her on 03/14/2017.  She will contact the office in the interim with any problems.  Patient seen with Dr. Benay Spice.  25 minutes were spent face-to-face at today's visit with the majority of that time involved in counseling/coordination of care.    Ned Card ANP/GNP-BC   03/06/2017  11:54 AM  This was a shared visit with Ned Card.  Heather Frederick was interviewed and examined.  She has completed 2 cycles of FOLFOX.  The abdominal pain  has persisted and the CA 19-9 is higher.  We discussed options of comfort care, FOLFIRI, and FOLFIRINOX with Heather Frederick  and her husband.  We reviewed the potential toxicities associated with irinotecan.  The plan is to proceed with dose adjusted FOLFIRINOX.  Julieanne Manson, MD

## 2017-03-07 ENCOUNTER — Ambulatory Visit: Payer: 59 | Admitting: Oncology

## 2017-03-07 ENCOUNTER — Ambulatory Visit: Payer: 59

## 2017-03-07 ENCOUNTER — Other Ambulatory Visit: Payer: 59

## 2017-03-07 LAB — CANCER ANTIGEN 19-9: CA 19-9: 5343 U/mL — ABNORMAL HIGH (ref 0–35)

## 2017-03-08 ENCOUNTER — Telehealth: Payer: Self-pay | Admitting: Oncology

## 2017-03-08 ENCOUNTER — Ambulatory Visit (HOSPITAL_BASED_OUTPATIENT_CLINIC_OR_DEPARTMENT_OTHER): Payer: 59

## 2017-03-08 VITALS — BP 100/51 | HR 63 | Temp 97.8°F | Resp 18

## 2017-03-08 DIAGNOSIS — C251 Malignant neoplasm of body of pancreas: Secondary | ICD-10-CM | POA: Diagnosis not present

## 2017-03-08 DIAGNOSIS — Z452 Encounter for adjustment and management of vascular access device: Secondary | ICD-10-CM | POA: Diagnosis not present

## 2017-03-08 DIAGNOSIS — C787 Secondary malignant neoplasm of liver and intrahepatic bile duct: Principal | ICD-10-CM

## 2017-03-08 DIAGNOSIS — C259 Malignant neoplasm of pancreas, unspecified: Secondary | ICD-10-CM

## 2017-03-08 MED ORDER — SODIUM CHLORIDE 0.9% FLUSH
10.0000 mL | INTRAVENOUS | Status: DC | PRN
  Administered 2017-03-08: 10 mL
  Filled 2017-03-08: qty 10

## 2017-03-08 MED ORDER — SODIUM CHLORIDE 0.9% FLUSH
1000.0000 mL | Freq: Once | INTRAVENOUS | Status: AC
  Administered 2017-03-08: 1000 mL via INTRAVENOUS
  Filled 2017-03-08: qty 1000

## 2017-03-08 MED ORDER — HEPARIN SOD (PORK) LOCK FLUSH 100 UNIT/ML IV SOLN
500.0000 [IU] | Freq: Once | INTRAVENOUS | Status: AC | PRN
  Administered 2017-03-08: 500 [IU]
  Filled 2017-03-08: qty 5

## 2017-03-08 NOTE — Patient Instructions (Signed)
[facility] Discharge Instructions for Patients Receiving Chemotherapy    To help prevent nausea and vomiting after your treatment, we encourage you to take your nausea medication as directed.   If you develop nausea and vomiting that is not controlled by your nausea medication, call the clinic.   BELOW ARE SYMPTOMS THAT SHOULD BE REPORTED IMMEDIATELY:  *FEVER GREATER THAN 100.5 F  *CHILLS WITH OR WITHOUT FEVER  NAUSEA AND VOMITING THAT IS NOT CONTROLLED WITH YOUR NAUSEA MEDICATION  *UNUSUAL SHORTNESS OF BREATH  *UNUSUAL BRUISING OR BLEEDING  TENDERNESS IN MOUTH AND THROAT WITH OR WITHOUT PRESENCE OF ULCERS  *URINARY PROBLEMS  *BOWEL PROBLEMS  UNUSUAL RASH Items with * indicate a potential emergency and should be followed up as soon as possible.  Feel free to call the clinic should you have any questions or concerns. The clinic phone number is [phone].  Please show the [facility] at check-in to the Emergency Department and triage nurse.   Dehydration, Adult Dehydration is when there is not enough fluid or water in your body. This happens when you lose more fluids than you take in. Dehydration can range from mild to very bad. It should be treated right away to keep it from getting very bad. Symptoms of mild dehydration may include:  Thirst.  Dry lips.  Slightly dry mouth.  Dry, warm skin.  Dizziness. Symptoms of moderate dehydration may include:  Very dry mouth.  Muscle cramps.  Dark pee (urine). Pee may be the color of tea.  Your body making less pee.  Your eyes making fewer tears.  Heartbeat that is uneven or faster than normal (palpitations).  Headache.  Light-headedness, especially when you stand up from sitting.  Fainting (syncope). Symptoms of very bad dehydration may include:  Changes in skin, such as: ? Cold and clammy skin. ? Blotchy (mottled) or pale skin. ? Skin that does not quickly return to normal after  being lightly pinched and let go (poor skin turgor).  Changes in body fluids, such as: ? Feeling very thirsty. ? Your eyes making fewer tears. ? Not sweating when body temperature is high, such as in hot weather. ? Your body making very little pee.  Changes in vital signs, such as: ? Weak pulse. ? Pulse that is more than 100 beats a minute when you are sitting still. ? Fast breathing. ? Low blood pressure.  Other changes, such as: ? Sunken eyes. ? Cold hands and feet. ? Confusion. ? Lack of energy (lethargy). ? Trouble waking up from sleep. ? Short-term weight loss. ? Unconsciousness. Follow these instructions at home:  If told by your doctor, drink an ORS: ? Make an ORS by using instructions on the package. ? Start by drinking small amounts, about  cup (120 mL) every 5-10 minutes. ? Slowly drink more until you have had the amount that your doctor said to have.  Drink enough clear fluid to keep your pee clear or pale yellow. If you were told to drink an ORS, finish the ORS first, then start slowly drinking clear fluids. Drink fluids such as: ? Water. Do not drink only water by itself. Doing that can make the salt (sodium) level in your body get too low (hyponatremia). ? Ice chips. ? Fruit juice that you have added water to (diluted). ? Low-calorie sports drinks.  Avoid: ? Alcohol. ? Drinks that have a lot of sugar. These include high-calorie sports drinks, fruit juice that does not have water added, and  soda. ? Caffeine. ? Foods that are greasy or have a lot of fat or sugar.  Take over-the-counter and prescription medicines only as told by your doctor.  Do not take salt tablets. Doing that can make the salt level in your body get too high (hypernatremia).  Eat foods that have minerals (electrolytes). Examples include bananas, oranges, potatoes, tomatoes, and spinach.  Keep all follow-up visits as told by your doctor. This is important. Contact a doctor if:  You have  belly (abdominal) pain that: ? Gets worse. ? Stays in one area (localizes).  You have a rash.  You have a stiff neck.  You get angry or annoyed more easily than normal (irritability).  You are more sleepy than normal.  You have a harder time waking up than normal.  You feel: ? Weak. ? Dizzy. ? Very thirsty.  You have peed (urinated) only a small amount of very dark pee during 6-8 hours. Get help right away if:  You have symptoms of very bad dehydration.  You cannot drink fluids without throwing up (vomiting).  Your symptoms get worse with treatment.  You have a fever.  You have a very bad headache.  You are throwing up or having watery poop (diarrhea) and it: ? Gets worse. ? Does not go away.  You have blood or something green (bile) in your throw-up.  You have blood in your poop (stool). This may cause poop to look black and tarry.  You have not peed in 6-8 hours.  You pass out (faint).  Your heart rate when you are sitting still is more than 100 beats a minute.  You have trouble breathing. This information is not intended to replace advice given to you by your health care provider. Make sure you discuss any questions you have with your health care provider. Document Released: 01/07/2009 Document Revised: 10/01/2015 Document Reviewed: 05/07/2015 Elsevier Interactive Patient Education  2018 [employer].

## 2017-03-08 NOTE — Telephone Encounter (Signed)
Scheduled appt per 12/11 los - patient is aware of appt date and time - also my chart active.

## 2017-03-09 ENCOUNTER — Encounter: Payer: Self-pay | Admitting: Cardiology

## 2017-03-09 ENCOUNTER — Ambulatory Visit (INDEPENDENT_AMBULATORY_CARE_PROVIDER_SITE_OTHER): Payer: 59 | Admitting: Cardiology

## 2017-03-09 VITALS — BP 122/62 | HR 120 | Ht 63.0 in | Wt 262.8 lb

## 2017-03-09 DIAGNOSIS — G4733 Obstructive sleep apnea (adult) (pediatric): Secondary | ICD-10-CM | POA: Diagnosis not present

## 2017-03-09 DIAGNOSIS — I10 Essential (primary) hypertension: Secondary | ICD-10-CM

## 2017-03-09 NOTE — Patient Instructions (Addendum)
Medication Instructions:  Your physician recommends that you continue on your current medications as directed. Please refer to the Current Medication list given to you today.   Labwork: None ordered  Testing/Procedures: None ordered  Follow-Up: Your physician wants you to follow-up in: 1 year with Dr. Radford Pax. You will receive a reminder letter in the mail two months in advance. If you don't receive a letter, please call our office to schedule the follow-up appointment.   Any Other Special Instructions Will Be Listed Below (If Applicable).  We are going to do a 2 week autotitration from 4-20 cm H2O   If you need a refill on your cardiac medications before your next appointment, please call your pharmacy.

## 2017-03-09 NOTE — Progress Notes (Signed)
Cardiology Office Note:    Date:  03/09/2017   ID:  Heather Frederick, DOB September 10, 1955, MRN [id number]  PCP:  [name], MD  Cardiologist: Daneen Schick, MD  Referring MD: [name], MD   Chief Complaint  Patient presents with  . Sleep Apnea  . Hypertension    History of Present Illness:   This is a 61yo female with a history of severe OSA with an AHI of 37.6/hr occurring in REM sleep in the non supine position. She had oxygen desaturations as low as 83%.  She is doing well with her CPAP device.  She tolerates the nasal pillow mask but no chin strap and feels the pressure is too much.  Since going on CPAP she feels rested in the am but does nap during the day due to all the pain meds she is taking.  She does have some mouthl dryness..  She does not think that he snores.     Past Medical History:  Diagnosis Date  . Allergy   . Anxiety   . Arthritis   . Benign essential HTN 09/23/2014  . Cancer ([place])   . Chronic kidney disease    uti  . Depression   . Dyslipidemia   . Elevated liver enzymes   . Family history of anesthesia complication    father has a severe hard time waking up  . Gallstones    a. Seen on CT 01/2014.  Marland Kitchen GERD (gastroesophageal reflux disease)   . Hard of hearing   . Hepatic steatosis   . History of frequent urinary tract infections   . Hyperlipidemia   . Hypertension   . Lateral epicondylitis of right elbow   . Mental disorder   . Meralgia paresthetica of right side 10/02/2012   slight at 05/2014  . Migraine headache   . Obesity   . OSA (obstructive sleep apnea)    severe with AHI 37/hr now on CPAP at 18cm H2O  . Osteoarthritis   . Pneumonia    Feb 2018  . Raynaud disease    in feet per patient   . Sepsis ([place]) 02/23/2017  . Sleep apnea    wears c-pap  . Urinary tract infection     Past Surgical History:  Procedure Laterality Date  . ABDOMINAL HYSTERECTOMY  1/04   partial  . BLADDER SUSPENSION  6/10  . CARDIAC CATHETERIZATION    . carpel  tunnel left/right  7/08, 8/08 Bilateral 8/08 and 7/08  . carpel tunnel rel Right 4/12  . CHOLECYSTECTOMY N/A 06/11/2014   Procedure: LAPAROSCOPIC CHOLECYSTECTOMY WITH ATTEMPTED INTRAOPERATIVE CHOLANGIOGRAM;  Surgeon: Jackolyn Confer, MD;  Location: [facility];  Service: General;  Laterality: N/A;  . COLONOSCOPY    . ERCP N/A 10/19/2014   Procedure: ENDOSCOPIC RETROGRADE CHOLANGIOPANCREATOGRAPHY (ERCP);  Surgeon: Ladene Artist, MD;  Location: [facility];  Service: Endoscopy;  Laterality: N/A;  . INCISIONAL HERNIA REPAIR N/A 06/30/2016   Procedure: LAPAROSCOPIC REPAIR OF INCISIONAL HERNIA WITH MESH;  Surgeon: Jackolyn Confer, MD;  Location: [facility];  Service: General;  Laterality: N/A;  . INSERTION OF MESH N/A 06/30/2016   Procedure: INSERTION OF MESH;  Surgeon: Jackolyn Confer, MD;  Location: [facility];  Service: General;  Laterality: N/A;  . IR CV LINE INJECTION  11/24/2016  . IR FLUORO GUIDE PORT INSERTION RIGHT  08/28/2016  . IR US GUIDE VASC ACCESS RIGHT  08/28/2016  . JOINT REPLACEMENT    . KNEE ARTHROSCOPY Left 12/12  . KNEE ARTHROSCOPY Right 12/06  .  LEFT HEART CATHETERIZATION WITH CORONARY ANGIOGRAM N/A 03/10/2014   Procedure: LEFT HEART CATHETERIZATION WITH CORONARY ANGIOGRAM;  Surgeon: Sinclair Grooms, MD;  Location: [facility];  Service: Cardiovascular;  Laterality: N/A;  . PLANTAR FASCIA RELEASE Right 12/10  . radial tunnel release     right arm   . ROTATOR CUFF REPAIR Left 6/11  . tennis elbow release Right 7/04  . TOTAL KNEE ARTHROPLASTY Left 09/10/2012   Procedure: TOTAL KNEE ARTHROPLASTY- left;  Surgeon: Garald Balding, MD;  Location: [facility];  Service: Orthopedics;  Laterality: Left;  Left total knee arthroplasty    Current Medications: Current Meds  Medication Sig  . acetaminophen (TYLENOL) 650 MG CR tablet Take 1,300 mg by mouth every 8 (eight) hours as needed for pain.  Marland Kitchen aspirin EC 81 MG tablet Take 81 mg by mouth daily.  . citalopram (CELEXA) 20 MG tablet TAKE ONE (1) TABLET  BY MOUTH EVERY DAY  . Cranberry-Vitamin C-Vitamin E (CRANBERRY PLUS VITAMIN C PO) Take 1 tablet by mouth daily. 4200 mg  . cyclobenzaprine (FLEXERIL) 5 MG tablet Take 1 tablet (5 mg total) 3 (three) times daily as needed by mouth for muscle spasms. (Patient taking differently: Take 5 mg by mouth 3 (three) times daily. )  . eletriptan (RELPAX) 40 MG tablet Take 1 tablet (40 mg total) by mouth every 2 (two) hours as needed for migraine.  Marland Kitchen HYDROcodone-acetaminophen (NORCO/VICODIN) 5-325 MG tablet Take 1 tablet by mouth every 6 (six) hours as needed for moderate pain.  Marland Kitchen HYDROmorphone (DILAUDID) 4 MG tablet Take 1-2 tablets (4-8 mg total) every 4 (four) hours as needed by mouth for severe pain.  . hydrOXYzine (ATARAX/VISTARIL) 25 MG tablet Take 25 mg by mouth every 8 (eight) hours as needed for itching.   . insulin detemir (LEVEMIR) 100 UNIT/ML injection Inject 0.1 mLs (10 Units total) at bedtime into the skin.  Marland Kitchen insulin lispro (HUMALOG) 100 UNIT/ML injection Inject into the skin 3 (three) times daily before meals. Only takes when glucose is "very high"  . lidocaine-prilocaine (EMLA) cream Apply 1 application topically as needed. Apply to [facility] a Cath site one hour prior to needle stick.  Marland Kitchen loratadine (CLARITIN) 10 MG tablet Take 10-40 mg by mouth daily as needed for allergies.   Marland Kitchen LORazepam (ATIVAN) 0.5 MG tablet Take 1 tablet (0.5 mg total) by mouth every 8 (eight) hours as needed (for nausea).  Marland Kitchen LYRICA 200 MG capsule TAKE ONE (1) CAPSULE BY MOUTH EVERY MORNING  . Multiple Vitamin (MULTIVITAMIN WITH MINERALS) TABS tablet Take 1 tablet by mouth daily.  . nitroGLYCERIN (NITROSTAT) 0.4 MG SL tablet Place 1 tablet (0.4 mg total) under the tongue every 5 (five) minutes as needed for chest pain.  Marland Kitchen ondansetron (ZOFRAN-ODT) 8 MG disintegrating tablet Take 1 tablet (8 mg total) by mouth every 8 (eight) hours as needed for nausea or vomiting.  Marland Kitchen oxybutynin (DITROPAN-XL) 10 MG 24 hr tablet Take 1 tablet (10 mg  total) by mouth every morning.  Marland Kitchen oxyCODONE (OXY IR/ROXICODONE) 5 MG immediate release tablet Take 1 tablet (5 mg total) by mouth every 4 (four) hours as needed for severe pain.  . pantoprazole (PROTONIX) 40 MG tablet TAKE ONE (1) TABLET BY MOUTH TWO (2) TIMES DAILY  . polyethylene glycol (MIRALAX / GLYCOLAX) packet Take 17 g by mouth daily as needed for mild constipation or moderate constipation.  . prochlorperazine (COMPAZINE) 10 MG tablet Take 10 mg by mouth every 6 (six) hours as needed  for nausea or vomiting.  . ranitidine (ZANTAC) 300 MG tablet TAKE ONE TABLET BY MOUTH EVERY EVENING  . simethicone (MYLICON) 80 MG chewable tablet Chew 80-160 mg by mouth 2 (two) times daily as needed (stomach pain).  . vitamin B-12 (CYANOCOBALAMIN) 1000 MCG tablet Take 2,500 mcg by mouth every morning.  Alveda Reasons 20 MG TABS tablet Take 20 mg daily by mouth.     Allergies:   Topamax [topiramate]; Aleve [naproxen sodium]; Bee venom; Echinacea; Other; Sulfa antibiotics; and Advil [ibuprofen]   Social History   Socioeconomic History  . Marital status: Married    Spouse name: Remo Lipps  . Number of children: 2  . Years of education: 73  . Highest education level: None  Social Needs  . Financial resource strain: None  . Food insecurity - worry: None  . Food insecurity - inability: None  . Transportation needs - medical: None  . Transportation needs - non-medical: None  Occupational History  . Occupation: Geneticist, molecular: Psychologist, sport and exercise [employer]  Tobacco Use  . Smoking status: Never Smoker  . Smokeless tobacco: Never Used  . Tobacco comment: Secondhand - from family growing up, workplace intermittently  Substance and Sexual Activity  . Alcohol use: Yes    Alcohol/week: 0.0 oz    Comment: occasionally - intermittent, no more than twice a week  . Drug use: No  . Sexual activity: None  Other Topics Concern  . None  Social History Narrative   Patient is married Remo Lipps) and lives at home with her  husband and child.   Patient has two children.   Patient has a college education.   Patient is right-handed.   Patient drinks 1-2 cup of coffee/tea daily.     Family History: The patient's family history includes Breast cancer in her cousin and other; Clotting disorder in her father; Colon cancer in her paternal grandmother; Coronary artery disease in her father; Hypertension in her brother, father, and mother; Kidney failure in her brother; Liver disease in her mother; Migraines in her brother, daughter, and father; Pancreatic cancer in her paternal grandmother; Stomach cancer in her paternal grandmother; Stroke in her mother. There is no history of Esophageal cancer or Rectal cancer.  ROS:   Please see the history of present illness.    ROS  All other systems reviewed and negative.   EKGs/Labs/Other Studies Reviewed:    The following studies were reviewed today: CPAP download  EKG:  EKG is not ordered today.   Recent Labs: 03/06/2017: ALT 21; BUN 7.9; Creatinine 0.9; HGB 10.1; Platelets 203; Potassium 4.2; Sodium 135   Recent Lipid Panel    Component Value Date/Time   CHOL 146 07/28/2015 0859   TRIG 133.0 07/28/2015 0859   HDL 31.30 (L) 07/28/2015 0859   CHOLHDL 5 07/28/2015 0859   VLDL 26.6 07/28/2015 0859   LDLCALC 88 07/28/2015 0859    Physical Exam:    VS:  BP 122/62   Pulse (!) 120   Ht 5\' 3"  (1.6 m)   Wt 262 lb 12.8 oz (119.2 kg)   SpO2 97%   BMI 46.55 kg/m     Wt Readings from Last 3 Encounters:  03/09/17 262 lb 12.8 oz (119.2 kg)  03/06/17 258 lb 12.8 oz (117.4 kg)  02/27/17 262 lb 6.4 oz (119 kg)     GEN:  Well nourished, well developed in no acute distress HEENT: Normal NECK: No JVD; No carotid bruits LYMPHATICS: No lymphadenopathy CARDIAC: RRR, no murmurs, rubs,  gallops RESPIRATORY:  Clear to auscultation without rales, wheezing or rhonchi  ABDOMEN: Soft, non-tender, non-distended MUSCULOSKELETAL:  No edema; No deformity  SKIN: Warm and  dry NEUROLOGIC:  Alert and oriented x 3 PSYCHIATRIC:  Normal affect   ASSESSMENT:    1. OSA (obstructive sleep apnea)   2. Benign essential HTN   3. Obesity, Class III, BMI 40-49.9 (morbid obesity) ([place])    PLAN:    In order of problems listed above:  1.  OSA - the patient is tolerating PAP therapy well without any problems. The PAP download was reviewed today and showed an AHI of 0.6/hr on 14 cm H2O with 60% compliance in using more than 4 hours nightly.  The patient has been using and benefiting from CPAP use and will continue to benefit from therapy. I have encouraged her to try to be more compliant with her device. She feels the pressure is too high so I will get a 2 week autotitration from 4 to 20cm H2O  2.  HTN - BP is well controlled on exam today.    3.  Obesity - she cannot exercise due to severe pain from her CA   Medication Adjustments/Labs and Tests Ordered: Current medicines are reviewed at length with the patient today.  Concerns regarding medicines are outlined above.  No orders of the defined types were placed in this encounter.  No orders of the defined types were placed in this encounter.   Signed, Fransico Him, MD  03/09/2017 9:00 AM    [place]

## 2017-03-11 ENCOUNTER — Emergency Department (HOSPITAL_COMMUNITY)
Admission: EM | Admit: 2017-03-11 | Discharge: 2017-03-11 | Disposition: A | Discharge status: Home / Self Care | Payer: 59 | Source: Home / Self Care | Attending: Emergency Medicine | Admitting: Emergency Medicine

## 2017-03-11 ENCOUNTER — Encounter (HOSPITAL_COMMUNITY): Payer: Self-pay

## 2017-03-11 ENCOUNTER — Emergency Department (HOSPITAL_COMMUNITY): Payer: 59

## 2017-03-11 DIAGNOSIS — R112 Nausea with vomiting, unspecified: Secondary | ICD-10-CM | POA: Insufficient documentation

## 2017-03-11 DIAGNOSIS — Z7722 Contact with and (suspected) exposure to environmental tobacco smoke (acute) (chronic): Secondary | ICD-10-CM | POA: Insufficient documentation

## 2017-03-11 DIAGNOSIS — Z794 Long term (current) use of insulin: Secondary | ICD-10-CM | POA: Insufficient documentation

## 2017-03-11 DIAGNOSIS — Z79899 Other long term (current) drug therapy: Secondary | ICD-10-CM | POA: Insufficient documentation

## 2017-03-11 DIAGNOSIS — Z7901 Long term (current) use of anticoagulants: Secondary | ICD-10-CM

## 2017-03-11 DIAGNOSIS — C229 Malignant neoplasm of liver, not specified as primary or secondary: Secondary | ICD-10-CM

## 2017-03-11 DIAGNOSIS — R0602 Shortness of breath: Secondary | ICD-10-CM | POA: Insufficient documentation

## 2017-03-11 DIAGNOSIS — C259 Malignant neoplasm of pancreas, unspecified: Secondary | ICD-10-CM | POA: Insufficient documentation

## 2017-03-11 DIAGNOSIS — N189 Chronic kidney disease, unspecified: Secondary | ICD-10-CM | POA: Insufficient documentation

## 2017-03-11 DIAGNOSIS — Z7982 Long term (current) use of aspirin: Secondary | ICD-10-CM

## 2017-03-11 DIAGNOSIS — I130 Hypertensive heart and chronic kidney disease with heart failure and stage 1 through stage 4 chronic kidney disease, or unspecified chronic kidney disease: Secondary | ICD-10-CM

## 2017-03-11 DIAGNOSIS — R1010 Upper abdominal pain, unspecified: Secondary | ICD-10-CM | POA: Insufficient documentation

## 2017-03-11 DIAGNOSIS — I5032 Chronic diastolic (congestive) heart failure: Secondary | ICD-10-CM

## 2017-03-11 LAB — COMPREHENSIVE METABOLIC PANEL
ALT: 24 U/L (ref 14–54)
ANION GAP: 10 (ref 5–15)
AST: 21 U/L (ref 15–41)
Albumin: 2.6 g/dL — ABNORMAL LOW (ref 3.5–5.0)
Alkaline Phosphatase: 155 U/L — ABNORMAL HIGH (ref 38–126)
BILIRUBIN TOTAL: 1.1 mg/dL (ref 0.3–1.2)
BUN: 14 mg/dL (ref 6–20)
CO2: 25 mmol/L (ref 22–32)
Calcium: 8.7 mg/dL — ABNORMAL LOW (ref 8.9–10.3)
Chloride: 95 mmol/L — ABNORMAL LOW (ref 101–111)
Creatinine, Ser: 0.65 mg/dL (ref 0.44–1.00)
GFR calc Af Amer: >60 mL/min (ref >60)
Glucose, Bld: 164 mg/dL — ABNORMAL HIGH (ref 65–99)
POTASSIUM: 3.7 mmol/L (ref 3.5–5.1)
Sodium: 130 mmol/L — ABNORMAL LOW (ref 135–145)
TOTAL PROTEIN: 6.8 g/dL (ref 6.5–8.1)

## 2017-03-11 LAB — URINALYSIS, ROUTINE W REFLEX MICROSCOPIC
BILIRUBIN URINE: NEGATIVE
GLUCOSE, UA: NEGATIVE mg/dL
HGB URINE DIPSTICK: NEGATIVE
Ketones, ur: NEGATIVE mg/dL
LEUKOCYTES UA: NEGATIVE
NITRITE: NEGATIVE
Protein, ur: 100 mg/dL — AB
SPECIFIC GRAVITY, URINE: 1.032 — AB (ref 1.005–1.030)
pH: 8 (ref 5.0–8.0)

## 2017-03-11 LAB — LIPASE, BLOOD: Lipase: 19 U/L (ref 11–51)

## 2017-03-11 LAB — CBC
HEMATOCRIT: 32.8 % — AB (ref 36.0–46.0)
HEMOGLOBIN: 10.6 g/dL — AB (ref 12.0–15.0)
MCH: 27.7 pg (ref 26.0–34.0)
MCHC: 32.3 g/dL (ref 30.0–36.0)
MCV: 85.9 fL (ref 78.0–100.0)
Platelets: 146 10*3/uL — ABNORMAL LOW (ref 150–400)
RBC: 3.82 MIL/uL — ABNORMAL LOW (ref 3.87–5.11)
RDW: 15.3 % (ref 11.5–15.5)
WBC: 4.5 10*3/uL (ref 4.0–10.5)

## 2017-03-11 MED ORDER — IOPAMIDOL (ISOVUE-370) INJECTION 76%
INTRAVENOUS | Status: AC
  Administered 2017-03-11: 100 mL via INTRAVENOUS
  Filled 2017-03-11: qty 100

## 2017-03-11 MED ORDER — SODIUM CHLORIDE 0.9 % IV BOLUS (SEPSIS)
1000.0000 mL | Freq: Once | INTRAVENOUS | Status: AC
  Administered 2017-03-11: 1000 mL via INTRAVENOUS

## 2017-03-11 MED ORDER — MORPHINE SULFATE (PF) 4 MG/ML IV SOLN
4.0000 mg | Freq: Once | INTRAVENOUS | Status: AC | PRN
  Administered 2017-03-11: 4 mg via INTRAVENOUS
  Filled 2017-03-11: qty 1

## 2017-03-11 MED ORDER — ONDANSETRON HCL 4 MG/2ML IJ SOLN
4.0000 mg | Freq: Once | INTRAMUSCULAR | Status: AC | PRN
  Administered 2017-03-11: 4 mg via INTRAVENOUS
  Filled 2017-03-11: qty 2

## 2017-03-11 NOTE — ED Notes (Signed)
Pt verbalized understanding of discharge instructions.  Awaiting ride.

## 2017-03-11 NOTE — ED Notes (Signed)
Bed: HY07 Expected date: 03/11/17 Expected time: 11:14 AM Means of arrival: Ambulance Comments: Abd pain Pancreatic Cancer

## 2017-03-11 NOTE — ED Triage Notes (Signed)
Per EMS, pt from home.  Pt dx with pancreatic cancer.  Takes treatments.  Post treatments, pt has episodes of abdominal pain and nausea.  Started this treatment 6 days ago and now symptoms have started since yesterday.  BP 108/64, hr 98, 98%4L [place], new, resp 20, cbg 213.  4mg  zofran and 147mcb fentanyl in route.  IV 20g LAC

## 2017-03-11 NOTE — ED Provider Notes (Signed)
[facility] DEPT Provider Note   CSN: [id number] Arrival date & time: 03/11/17  1117     History   Chief Complaint Chief Complaint  Patient presents with  . Abdominal Pain  . Nausea    HPI Heather Frederick is a 61 y.o. female.  The history is provided by the patient, the spouse and medical records. No language interpreter was used.  Abdominal Pain   Associated symptoms include nausea. Pertinent negatives include diarrhea, vomiting and constipation.   Heather Frederick is a 61 y.o. female  with a PMH of pancreatic cancer with known liver mets who presents to the Emergency Department complaining of progressively worsening right upper quadrant abdominal pain which began yesterday.  Associated symptoms include nausea and fatigue.  No vomiting.  Patient endorses similar symptoms the day or 2 after her chemotherapy infusions.  She has had 2 infusions with similar symptoms.  She had her third infusion 3 days ago and did not feel sick afterwards.  This feels similar, however timeframe is different.  She tried Norco at home with minimal relief.  4 mg of Zofran and 100 mcg of fentanyl given by EMS prior to arrival.  Patient states her nausea and abdominal pain are much improved following meds by EMS.  She does report feeling short of breath earlier this morning.  She is not on oxygen at home, but believes that oxygen provided by this made her feel better.  She is not hypoxic. No chest pain, cough, congestion. No fever or chills.  No diarrhea, constipation, back pain.  Past Medical History:  Diagnosis Date  . Allergy   . Anxiety   . Arthritis   . Benign essential HTN 09/23/2014  . Cancer ([place])   . Chronic kidney disease    uti  . Depression   . Dyslipidemia   . Elevated liver enzymes   . Family history of anesthesia complication    father has a severe hard time waking up  . Gallstones    a. Seen on CT 01/2014.  Marland Kitchen GERD (gastroesophageal reflux disease)   . Hard  of hearing   . Hepatic steatosis   . History of frequent urinary tract infections   . Hyperlipidemia   . Hypertension   . Lateral epicondylitis of right elbow   . Mental disorder   . Meralgia paresthetica of right side 10/02/2012   slight at 05/2014  . Migraine headache   . Obesity   . OSA (obstructive sleep apnea)    severe with AHI 37/hr now on CPAP at 18cm H2O  . Osteoarthritis   . Pneumonia    Feb 2018  . Raynaud disease    in feet per patient   . Sepsis ([place]) 02/23/2017  . Sleep apnea    wears c-pap  . Urinary tract infection     Patient Active Problem List   Diagnosis Date Noted  . Sepsis ([place]) 02/24/2017  . Chronic anticoagulation 02/23/2017  . Fever 02/23/2017  . Dizziness 02/05/2017  . Portacath in place 10/18/2016  . Goals of care, counseling/discussion 09/08/2016  . Pancreatic carcinoma metastatic to liver ([place]) 08/29/2016  . Mid back pain 07/07/2016  . Incisional hernia 06/30/2016  . Edema extremities 03/07/2016  . Pain of upper abdomen 01/27/2016  . Depression 01/28/2015  . Bile duct stone   . Elevated LFTs   . Benign essential HTN 09/23/2014  . Routine general medical examination at a health care facility 08/01/2014  . OSA (obstructive sleep apnea)  07/04/2014  . Dyslipidemia 03/09/2014  . Diastolic heart failure, stage B ([place])   . GERD (gastroesophageal reflux disease)   . Meralgia paresthetica of right side 10/02/2012  . Osteoarthritis of left knee 09/12/2012  . Migraines 09/12/2012  . Obesity, Class III, BMI 40-49.9 (morbid obesity) ([place]) 09/12/2012    Past Surgical History:  Procedure Laterality Date  . ABDOMINAL HYSTERECTOMY  1/04   partial  . BLADDER SUSPENSION  6/10  . CARDIAC CATHETERIZATION    . carpel tunnel left/right  7/08, 8/08 Bilateral 8/08 and 7/08  . carpel tunnel rel Right 4/12  . CHOLECYSTECTOMY N/A 06/11/2014   Procedure: LAPAROSCOPIC CHOLECYSTECTOMY WITH ATTEMPTED INTRAOPERATIVE CHOLANGIOGRAM;  Surgeon: Jackolyn Confer, MD;   Location: [facility];  Service: General;  Laterality: N/A;  . COLONOSCOPY    . ERCP N/A 10/19/2014   Procedure: ENDOSCOPIC RETROGRADE CHOLANGIOPANCREATOGRAPHY (ERCP);  Surgeon: Ladene Artist, MD;  Location: [facility];  Service: Endoscopy;  Laterality: N/A;  . INCISIONAL HERNIA REPAIR N/A 06/30/2016   Procedure: LAPAROSCOPIC REPAIR OF INCISIONAL HERNIA WITH MESH;  Surgeon: Jackolyn Confer, MD;  Location: [facility];  Service: General;  Laterality: N/A;  . INSERTION OF MESH N/A 06/30/2016   Procedure: INSERTION OF MESH;  Surgeon: Jackolyn Confer, MD;  Location: [facility];  Service: General;  Laterality: N/A;  . IR CV LINE INJECTION  11/24/2016  . IR FLUORO GUIDE PORT INSERTION RIGHT  08/28/2016  . IR US GUIDE VASC ACCESS RIGHT  08/28/2016  . JOINT REPLACEMENT    . KNEE ARTHROSCOPY Left 12/12  . KNEE ARTHROSCOPY Right 12/06  . LEFT HEART CATHETERIZATION WITH CORONARY ANGIOGRAM N/A 03/10/2014   Procedure: LEFT HEART CATHETERIZATION WITH CORONARY ANGIOGRAM;  Surgeon: Sinclair Grooms, MD;  Location: [facility] CATH LAB;  Service: Cardiovascular;  Laterality: N/A;  . PLANTAR FASCIA RELEASE Right 12/10  . radial tunnel release     right arm   . ROTATOR CUFF REPAIR Left 6/11  . tennis elbow release Right 7/04  . TOTAL KNEE ARTHROPLASTY Left 09/10/2012   Procedure: TOTAL KNEE ARTHROPLASTY- left;  Surgeon: Garald Balding, MD;  Location: [facility];  Service: Orthopedics;  Laterality: Left;  Left total knee arthroplasty    OB History    No data available       Home Medications    Prior to Admission medications   Medication Sig Start Date End Date Taking? Authorizing Provider  acetaminophen (TYLENOL) 650 MG CR tablet Take 1,300 mg by mouth every 8 (eight) hours as needed for pain.   Yes [provider]  aspirin EC 81 MG tablet Take 81 mg by mouth daily.   Yes [provider]  citalopram (CELEXA) 20 MG tablet TAKE ONE (1) TABLET BY MOUTH EVERY DAY 01/22/17  Yes Hoyt Koch, MD    Cranberry-Vitamin C-Vitamin E (CRANBERRY PLUS VITAMIN C PO) Take 1 tablet by mouth daily. 4200 mg   Yes [provider]  cyclobenzaprine (FLEXERIL) 5 MG tablet Take 1 tablet (5 mg total) 3 (three) times daily as needed by mouth for muscle spasms. Patient taking differently: Take 5 mg by mouth 3 (three) times daily.  02/02/17  Yes Tanner, Lyndon Code., PA-C  eletriptan (RELPAX) 40 MG tablet Take 1 tablet (40 mg total) by mouth every 2 (two) hours as needed for migraine. 06/26/16  Yes Dennie Bible, NP  HYDROcodone-acetaminophen (NORCO/VICODIN) 5-325 MG tablet Take 1 tablet by mouth every 6 (six) hours as needed for moderate pain.   Yes [provider]  HYDROmorphone (DILAUDID) 4 MG tablet Take 1-2 tablets (4-8 mg total) every 4 (four) hours as needed by mouth for severe pain. 02/08/17  Yes Ladell Pier, MD  hydrOXYzine (ATARAX/VISTARIL) 25 MG tablet Take 25 mg by mouth every 8 (eight) hours as needed for itching.  09/18/16  Yes [provider]  insulin detemir (LEVEMIR) 100 UNIT/ML injection Inject 0.1 mLs (10 Units total) at bedtime into the skin. 02/06/17  Yes [name], MD  insulin lispro (HUMALOG) 100 UNIT/ML injection Inject 0-6 Units into the skin 3 (three) times daily before meals. Only takes when glucose is "very high"   Yes [provider]  lidocaine-prilocaine (EMLA) cream Apply 1 application topically as needed. Apply to [facility] a Cath site one hour prior to needle stick. 08/29/16  Yes Ladell Pier, MD  loratadine (CLARITIN) 10 MG tablet Take 10-40 mg by mouth daily as needed for allergies.    Yes [provider]  LORazepam (ATIVAN) 0.5 MG tablet Take 1 tablet (0.5 mg total) by mouth every 8 (eight) hours as needed (for nausea). 02/19/17  Yes Ladell Pier, MD  LYRICA 200 MG capsule TAKE ONE (1) CAPSULE BY MOUTH EVERY MORNING 12/18/16  Yes Dennie Bible, NP  Multiple Vitamin (MULTIVITAMIN WITH MINERALS) TABS tablet Take 1 tablet by  mouth daily.   Yes [provider]  nitroGLYCERIN (NITROSTAT) 0.4 MG SL tablet Place 1 tablet (0.4 mg total) under the tongue every 5 (five) minutes as needed for chest pain. 06/28/15  Yes Belva Crome, MD  ondansetron (ZOFRAN-ODT) 8 MG disintegrating tablet Take 1 tablet (8 mg total) by mouth every 8 (eight) hours as needed for nausea or vomiting. 09/18/16  Yes Curcio, Roselie Awkward, NP  oxybutynin (DITROPAN-XL) 10 MG 24 hr tablet Take 1 tablet (10 mg total) by mouth every morning. 03/06/17  Yes Owens Shark, NP  oxyCODONE (OXY IR/ROXICODONE) 5 MG immediate release tablet Take 1 tablet (5 mg total) by mouth every 4 (four) hours as needed for severe pain. 02/27/17  Yes Owens Shark, NP  pantoprazole (PROTONIX) 40 MG tablet TAKE ONE (1) TABLET BY MOUTH TWO (2) TIMES DAILY 09/25/16  Yes Ladene Artist, MD  polyethylene glycol (MIRALAX / GLYCOLAX) packet Take 17 g by mouth daily as needed for mild constipation or moderate constipation. 02/24/17  Yes Robbie Lis, MD  prochlorperazine (COMPAZINE) 10 MG tablet Take 10 mg by mouth every 6 (six) hours as needed for nausea or vomiting.   Yes [provider]  ranitidine (ZANTAC) 300 MG tablet TAKE ONE TABLET BY MOUTH EVERY EVENING 03/07/17  Yes Ladene Artist, MD  simethicone (MYLICON) 80 MG chewable tablet Chew 80-160 mg by mouth 2 (two) times daily as needed (stomach pain).   Yes [provider]  vitamin B-12 (CYANOCOBALAMIN) 1000 MCG tablet Take 2,500 mcg by mouth every morning.   Yes [provider]  XARELTO 20 MG TABS tablet Take 20 mg daily by mouth. 01/31/17  Yes [provider]    Family History Family History  Problem Relation Age of Onset  . Hypertension Mother   . Stroke Mother   . Liver disease Mother        Abcess  . Hypertension Father   . Coronary artery disease Father   . Migraines Father   . Clotting disorder Father   . Kidney failure Brother   . Hypertension Brother   . Migraines  Brother   . Migraines Daughter   .  Breast cancer Other        Niece with breast cancer  . Colon cancer Paternal Grandmother   . Pancreatic cancer Paternal Grandmother   . Stomach cancer Paternal Grandmother   . Breast cancer Cousin   . Esophageal cancer Neg Hx   . Rectal cancer Neg Hx     Social History Social History   Tobacco Use  . Smoking status: Never Smoker  . Smokeless tobacco: Never Used  . Tobacco comment: Secondhand - from family growing up, workplace intermittently  Substance Use Topics  . Alcohol use: Yes    Alcohol/week: 0.0 oz    Comment: occasionally - intermittent, no more than twice a week  . Drug use: No     Allergies   Topamax [topiramate]; Aleve [naproxen sodium]; Bee venom; Echinacea; Other; Sulfa antibiotics; and Advil [ibuprofen]   Review of Systems Review of Systems  Respiratory: Positive for shortness of breath. Negative for cough.   Gastrointestinal: Positive for abdominal pain and nausea. Negative for blood in stool, constipation, diarrhea and vomiting.  All other systems reviewed and are negative.    Physical Exam Updated Vital Signs BP (!) 111/91   Pulse 97   Temp 98.5 F (36.9 C) (Oral)   Resp 18   SpO2 97%   Physical Exam  Constitutional: She is oriented to person, place, and time. She appears well-developed and well-nourished. No distress.  Afebrile.  HENT:  Head: Normocephalic and atraumatic.  Cardiovascular: Normal rate, regular rhythm and normal heart sounds.  No murmur heard. Pulmonary/Chest: Effort normal and breath sounds normal. No respiratory distress.  Abdominal: Soft. She exhibits no distension.  Tenderness to palpation of right upper quadrant and epigastrium without rebound or guarding.  Negative Murphy's.  Musculoskeletal: She exhibits no edema.  Neurological: She is alert and oriented to person, place, and time.  Skin: Skin is warm and dry.  Nursing note and vitals reviewed.    ED Treatments / Results   Labs (all labs ordered are listed, but only abnormal results are displayed) Labs Reviewed  COMPREHENSIVE METABOLIC PANEL - Abnormal; Notable for the following components:      Result Value   Sodium 130 (*)    Chloride 95 (*)    Glucose, Bld 164 (*)    Calcium 8.7 (*)    Albumin 2.6 (*)    Alkaline Phosphatase 155 (*)    All other components within normal limits  CBC - Abnormal; Notable for the following components:   RBC 3.82 (*)    Hemoglobin 10.6 (*)    HCT 32.8 (*)    Platelets 146 (*)    All other components within normal limits  URINALYSIS, ROUTINE W REFLEX MICROSCOPIC - Abnormal; Notable for the following components:   Specific Gravity, Urine 1.032 (*)    Protein, ur 100 (*)    Bacteria, UA RARE (*)    Squamous Epithelial / LPF 0-5 (*)    All other components within normal limits  LIPASE, BLOOD    EKG  EKG Interpretation None       Radiology Ct Angio Chest Pe W And/or Wo Contrast  Result Date: 03/11/2017 CLINICAL DATA:  61 year old female with pancreatic cancer undergoing treatment. Abdominal pain and nausea since yesterday. Chest pain and shortness of breath. EXAM: CT ANGIOGRAPHY CHEST CT ABDOMEN AND PELVIS WITH CONTRAST TECHNIQUE: Multidetector CT imaging of the chest was performed using the standard protocol during bolus administration of intravenous contrast. Multiplanar CT image reconstructions and MIPs were obtained to evaluate the  vascular anatomy. Multidetector CT imaging of the abdomen and pelvis was performed using the standard protocol during bolus administration of intravenous contrast. CONTRAST:  182mL ISOVUE-370 IOPAMIDOL (ISOVUE-370) INJECTION 76% COMPARISON:  CT Abdomen and Pelvis 01/01/2017.  Chest CT 12/06/2016. FINDINGS: CTA CHEST FINDINGS Cardiovascular: Good contrast bolus timing in the pulmonary arterial tree. Mild to moderate respiratory motion. No convincing pulmonary artery filling defect to suggest acute pulmonary embolus. No pericardial  effusion. Negative thoracic aorta. Right chest IJ approach porta cath in place. Mediastinum/Nodes: No mediastinal lymphadenopathy. Lungs/Pleura: Chronic elevation of the right hemidiaphragm. Lower lung volumes compared to the September chest CT. Increased right upper lobe pulmonary nodule since September on series 14, image 41, now 8 mm (previously 4 mm). Small lower lobe subpleural nodules appear more stable. There is dependent atelectasis. There is confluent right lower lobe atelectasis. No pleural effusion. Musculoskeletal: No acute or suspicious osseous lesion identified in the chest. Review of the MIP images confirms the above findings. CT ABDOMEN and PELVIS FINDINGS Hepatobiliary: Progressed liver metastatic disease since October. Numerous larger and more discrete hypodense, rim enhancing liver masses individually measuring up to 3.5-4 cm diameter. Surgically absent gallbladder. Pancreas: Heterogeneously enhancing body pancreatic mass appears larger than in October. Today this is 45 x 47 by 43 mm (AP by transverse by CC) versus 28 x 49 by about 31 mm previously. There is atrophy and ductal dilatation in the pancreatic tail. There is mild peripancreatic fat stranding. Small peripancreatic lymph nodes appear stable. No vascular occlusion about the mass. Spleen: Negative. Adrenals/Urinary Tract: Adrenal glands remain within normal limits. Bilateral renal enhancement and contrast excretion is within normal limits. Unremarkable urinary bladder. Stomach/Bowel: Mild retained stool in the distal colon. Redundant sigmoid tracking into the left mid abdomen. Mild redundancy of the transverse colon. No large bowel inflammation. Negative appendix. No dilated or inflamed small bowel. There is a 5 cm small bowel diverticulum suspected in the proximal jejunum (series 4, image 39) which was smaller and less apparent in October (on series 3, image 69 of that study). No surrounding inflammation. Prior ventral abdominal hernia  repair with mesh. Decompressed stomach. 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 The duodenum appears stable and otherwise within normal limits. No abdominal free fluid. Vascular/Lymphatic: Major arterial structures in the abdomen and pelvis are patent. Portal venous system is patent. Outside of the peripancreatic region there is no abdominal or pelvic lymphadenopathy. Reproductive: Surgically absent uterus.  Negative ovaries. Other: No pelvic free fluid. Musculoskeletal: No acute or suspicious osseous lesion identified. Review of the MIP images confirms the above findings. IMPRESSION: 1.  Negative for acute pulmonary embolus. 2. Progressed primary pancreatic tumor and liver metastases since 01/01/2017. 3. Increased solitary right upper lobe lung nodule since September suspicious for pulmonary oligometastatic disease. 4. Lower lung volumes with atelectasis.  No pleural effusion. 5. No bowel obstruction or other acute findings identified. Electronically Signed   By: Genevie Ann M.D.   On: 03/11/2017 14:08   Ct Abdomen Pelvis W Contrast  Result Date: 03/11/2017 CLINICAL DATA:  61 year old female with pancreatic cancer undergoing treatment. Abdominal pain and nausea since yesterday. Chest pain and shortness of breath. EXAM: CT ANGIOGRAPHY CHEST CT ABDOMEN AND PELVIS WITH CONTRAST TECHNIQUE: Multidetector CT imaging of the chest was performed using the standard protocol during bolus administration of intravenous contrast. Multiplanar CT image reconstructions and MIPs were obtained to evaluate the vascular anatomy. Multidetector CT imaging of the abdomen and pelvis was performed using the standard protocol during bolus administration of intravenous  contrast. CONTRAST:  163mL ISOVUE-370 IOPAMIDOL (ISOVUE-370) INJECTION 76% COMPARISON:  CT Abdomen and Pelvis 01/01/2017.  Chest CT 12/06/2016. FINDINGS: CTA CHEST FINDINGS Cardiovascular: Good contrast bolus timing in the pulmonary arterial tree. Mild to moderate  respiratory motion. No convincing pulmonary artery filling defect to suggest acute pulmonary embolus. No pericardial effusion. Negative thoracic aorta. Right chest IJ approach porta cath in place. Mediastinum/Nodes: No mediastinal lymphadenopathy. Lungs/Pleura: Chronic elevation of the right hemidiaphragm. Lower lung volumes compared to the September chest CT. Increased right upper lobe pulmonary nodule since September on series 14, image 41, now 8 mm (previously 4 mm). Small lower lobe subpleural nodules appear more stable. There is dependent atelectasis. There is confluent right lower lobe atelectasis. No pleural effusion. Musculoskeletal: No acute or suspicious osseous lesion identified in the chest. Review of the MIP images confirms the above findings. CT ABDOMEN and PELVIS FINDINGS Hepatobiliary: Progressed liver metastatic disease since October. Numerous larger and more discrete hypodense, rim enhancing liver masses individually measuring up to 3.5-4 cm diameter. Surgically absent gallbladder. Pancreas: Heterogeneously enhancing body pancreatic mass appears larger than in October. Today this is 45 x 47 by 43 mm (AP by transverse by CC) versus 28 x 49 by about 31 mm previously. There is atrophy and ductal dilatation in the pancreatic tail. There is mild peripancreatic fat stranding. Small peripancreatic lymph nodes appear stable. No vascular occlusion about the mass. Spleen: Negative. Adrenals/Urinary Tract: Adrenal glands remain within normal limits. Bilateral renal enhancement and contrast excretion is within normal limits. Unremarkable urinary bladder. Stomach/Bowel: Mild retained stool in the distal colon. Redundant sigmoid tracking into the left mid abdomen. Mild redundancy of the transverse colon. No large bowel inflammation. Negative appendix. No dilated or inflamed small bowel. There is a 5 cm small bowel diverticulum suspected in the proximal jejunum (series 4, image 39) which was smaller and less  apparent in October (on series 3, image 69 of that study). No surrounding inflammation. Prior ventral abdominal hernia repair with mesh. Decompressed stomach. 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 The duodenum appears stable and otherwise within normal limits. No abdominal free fluid. Vascular/Lymphatic: Major arterial structures in the abdomen and pelvis are patent. Portal venous system is patent. Outside of the peripancreatic region there is no abdominal or pelvic lymphadenopathy. Reproductive: Surgically absent uterus.  Negative ovaries. Other: No pelvic free fluid. Musculoskeletal: No acute or suspicious osseous lesion identified. Review of the MIP images confirms the above findings. IMPRESSION: 1.  Negative for acute pulmonary embolus. 2. Progressed primary pancreatic tumor and liver metastases since 01/01/2017. 3. Increased solitary right upper lobe lung nodule since September suspicious for pulmonary oligometastatic disease. 4. Lower lung volumes with atelectasis.  No pleural effusion. 5. No bowel obstruction or other acute findings identified. Electronically Signed   By: Genevie Ann M.D.   On: 03/11/2017 14:08    Procedures Procedures (including critical care time)  Medications Ordered in ED Medications  sodium chloride 0.9 % bolus 1,000 mL (0 mLs Intravenous Stopped 03/11/17 1357)  ondansetron (ZOFRAN) injection 4 mg (4 mg Intravenous Given 03/11/17 1357)  morphine 4 MG/ML injection 4 mg (4 mg Intravenous Given 03/11/17 1357)  iopamidol (ISOVUE-370) 76 % injection (100 mLs Intravenous Contrast Given 03/11/17 1310)     Initial Impression / Assessment and Plan / ED Course  I have reviewed the triage vital signs and the nursing notes.  Pertinent labs & imaging results that were available during my care of the patient were reviewed by me and considered in my  medical decision making (see chart for details).    Heather Frederick is a 61 y.o. female who presents to ED for upper abdominal pain. Hx  of pancreatic cancer with known mets to liver and likely to the lungs. On exam, patient is afebrile, hemodynamically stable.  Abdominal exam with tenderness across the upper abdomen, most especially to the right upper quadrant.  Negative Murphy's.  Labs reviewed and reassuring.  UA with no signs of infection.  CT angios negative for PE.  She does have progressed primary pancreatic tumor and liver metastases since her last CT 10/08.  Discussed these findings with the patient and husband at bedside. Evaluation does not show pathology that would require ongoing emergent intervention or inpatient treatment.  Patient is on 5 mg Norco every 4 hours for pain.  Will increase to 10 mg every 4 hours for pain and have her follow-up with her oncologist.  She agrees to call tomorrow morning to arrange follow-up.  Reasons to return to ER were discussed and all questions answered.  Patient discussed with Dr. Rogene Houston who agrees with treatment plan.    Final Clinical Impressions(s) / ED Diagnoses   Final diagnoses:  Pain of upper abdomen    ED Discharge Orders    None       Mouhamadou Gittleman, Ozella Almond, PA-C 03/11/17 1500    Fredia Sorrow, MD 03/12/17 0830

## 2017-03-11 NOTE — Discharge Instructions (Signed)
It was my pleasure taking care of you today!   These follow-up with either your primary care doctor or your oncologist.  Call tomorrow morning and let them know about her hospital visit and arrange follow-up.  Return to ER for new or worsening symptoms, any additional concerns.

## 2017-03-12 ENCOUNTER — Other Ambulatory Visit: Payer: Self-pay | Admitting: Oncology

## 2017-03-12 ENCOUNTER — Inpatient Hospital Stay (HOSPITAL_COMMUNITY)
Admission: EM | Admit: 2017-03-12 | Discharge: 2017-03-15 | DRG: 948 | Disposition: A | Discharge status: Home / Self Care | Payer: 59 | Attending: Internal Medicine | Admitting: Internal Medicine

## 2017-03-12 ENCOUNTER — Encounter (HOSPITAL_COMMUNITY): Payer: Self-pay | Admitting: Internal Medicine

## 2017-03-12 ENCOUNTER — Telehealth: Payer: Self-pay

## 2017-03-12 DIAGNOSIS — D6959 Other secondary thrombocytopenia: Secondary | ICD-10-CM | POA: Diagnosis present

## 2017-03-12 DIAGNOSIS — Z7982 Long term (current) use of aspirin: Secondary | ICD-10-CM

## 2017-03-12 DIAGNOSIS — G43909 Migraine, unspecified, not intractable, without status migrainosus: Secondary | ICD-10-CM | POA: Diagnosis present

## 2017-03-12 DIAGNOSIS — R1084 Generalized abdominal pain: Secondary | ICD-10-CM | POA: Diagnosis present

## 2017-03-12 DIAGNOSIS — Z96652 Presence of left artificial knee joint: Secondary | ICD-10-CM | POA: Diagnosis present

## 2017-03-12 DIAGNOSIS — E1122 Type 2 diabetes mellitus with diabetic chronic kidney disease: Secondary | ICD-10-CM | POA: Diagnosis present

## 2017-03-12 DIAGNOSIS — H9190 Unspecified hearing loss, unspecified ear: Secondary | ICD-10-CM | POA: Diagnosis present

## 2017-03-12 DIAGNOSIS — I7300 Raynaud's syndrome without gangrene: Secondary | ICD-10-CM | POA: Diagnosis present

## 2017-03-12 DIAGNOSIS — M62830 Muscle spasm of back: Secondary | ICD-10-CM

## 2017-03-12 DIAGNOSIS — G893 Neoplasm related pain (acute) (chronic): Principal | ICD-10-CM | POA: Diagnosis present

## 2017-03-12 DIAGNOSIS — Z87440 Personal history of urinary (tract) infections: Secondary | ICD-10-CM

## 2017-03-12 DIAGNOSIS — B370 Candidal stomatitis: Secondary | ICD-10-CM | POA: Diagnosis present

## 2017-03-12 DIAGNOSIS — C787 Secondary malignant neoplasm of liver and intrahepatic bile duct: Secondary | ICD-10-CM | POA: Diagnosis present

## 2017-03-12 DIAGNOSIS — R21 Rash and other nonspecific skin eruption: Secondary | ICD-10-CM | POA: Diagnosis present

## 2017-03-12 DIAGNOSIS — R109 Unspecified abdominal pain: Secondary | ICD-10-CM | POA: Diagnosis present

## 2017-03-12 DIAGNOSIS — Z794 Long term (current) use of insulin: Secondary | ICD-10-CM

## 2017-03-12 DIAGNOSIS — R52 Pain, unspecified: Secondary | ICD-10-CM | POA: Diagnosis present

## 2017-03-12 DIAGNOSIS — Z6841 Body Mass Index (BMI) 40.0 and over, adult: Secondary | ICD-10-CM

## 2017-03-12 DIAGNOSIS — Z886 Allergy status to analgesic agent status: Secondary | ICD-10-CM

## 2017-03-12 DIAGNOSIS — I5030 Unspecified diastolic (congestive) heart failure: Secondary | ICD-10-CM | POA: Diagnosis present

## 2017-03-12 DIAGNOSIS — I129 Hypertensive chronic kidney disease with stage 1 through stage 4 chronic kidney disease, or unspecified chronic kidney disease: Secondary | ICD-10-CM | POA: Diagnosis present

## 2017-03-12 DIAGNOSIS — C259 Malignant neoplasm of pancreas, unspecified: Secondary | ICD-10-CM | POA: Diagnosis present

## 2017-03-12 DIAGNOSIS — T451X5A Adverse effect of antineoplastic and immunosuppressive drugs, initial encounter: Secondary | ICD-10-CM | POA: Diagnosis present

## 2017-03-12 DIAGNOSIS — I10 Essential (primary) hypertension: Secondary | ICD-10-CM | POA: Diagnosis not present

## 2017-03-12 DIAGNOSIS — Z7901 Long term (current) use of anticoagulants: Secondary | ICD-10-CM

## 2017-03-12 DIAGNOSIS — D6481 Anemia due to antineoplastic chemotherapy: Secondary | ICD-10-CM | POA: Diagnosis present

## 2017-03-12 DIAGNOSIS — F419 Anxiety disorder, unspecified: Secondary | ICD-10-CM | POA: Diagnosis present

## 2017-03-12 DIAGNOSIS — Z91030 Bee allergy status: Secondary | ICD-10-CM

## 2017-03-12 DIAGNOSIS — R112 Nausea with vomiting, unspecified: Secondary | ICD-10-CM | POA: Diagnosis present

## 2017-03-12 DIAGNOSIS — Z888 Allergy status to other drugs, medicaments and biological substances status: Secondary | ICD-10-CM

## 2017-03-12 DIAGNOSIS — E669 Obesity, unspecified: Secondary | ICD-10-CM | POA: Diagnosis present

## 2017-03-12 DIAGNOSIS — I951 Orthostatic hypotension: Secondary | ICD-10-CM | POA: Diagnosis present

## 2017-03-12 DIAGNOSIS — F329 Major depressive disorder, single episode, unspecified: Secondary | ICD-10-CM | POA: Diagnosis present

## 2017-03-12 DIAGNOSIS — M545 Low back pain: Secondary | ICD-10-CM | POA: Diagnosis present

## 2017-03-12 DIAGNOSIS — I5032 Chronic diastolic (congestive) heart failure: Secondary | ICD-10-CM | POA: Diagnosis present

## 2017-03-12 DIAGNOSIS — Z79899 Other long term (current) drug therapy: Secondary | ICD-10-CM

## 2017-03-12 DIAGNOSIS — N189 Chronic kidney disease, unspecified: Secondary | ICD-10-CM | POA: Diagnosis present

## 2017-03-12 DIAGNOSIS — K219 Gastro-esophageal reflux disease without esophagitis: Secondary | ICD-10-CM | POA: Diagnosis present

## 2017-03-12 DIAGNOSIS — G4733 Obstructive sleep apnea (adult) (pediatric): Secondary | ICD-10-CM | POA: Diagnosis present

## 2017-03-12 LAB — CBC WITH DIFFERENTIAL/PLATELET
BASOS ABS: 0 10*3/uL (ref 0.0–0.1)
Basophils Relative: 0 %
EOS PCT: 1 %
Eosinophils Absolute: 0.1 10*3/uL (ref 0.0–0.7)
HEMATOCRIT: 31.8 % — AB (ref 36.0–46.0)
Hemoglobin: 10.3 g/dL — ABNORMAL LOW (ref 12.0–15.0)
LYMPHS ABS: 1 10*3/uL (ref 0.7–4.0)
LYMPHS PCT: 16 %
MCH: 27.3 pg (ref 26.0–34.0)
MCHC: 32.4 g/dL (ref 30.0–36.0)
MCV: 84.4 fL (ref 78.0–100.0)
MONO ABS: 0.7 10*3/uL (ref 0.1–1.0)
MONOS PCT: 11 %
Neutro Abs: 4.5 10*3/uL (ref 1.7–7.7)
Neutrophils Relative %: 72 %
PLATELETS: 138 10*3/uL — AB (ref 150–400)
RBC: 3.77 MIL/uL — ABNORMAL LOW (ref 3.87–5.11)
RDW: 15.1 % (ref 11.5–15.5)
WBC: 6.3 10*3/uL (ref 4.0–10.5)

## 2017-03-12 LAB — COMPREHENSIVE METABOLIC PANEL
ALK PHOS: 172 U/L — AB (ref 38–126)
ALT: 21 U/L (ref 14–54)
AST: 21 U/L (ref 15–41)
Albumin: 2.7 g/dL — ABNORMAL LOW (ref 3.5–5.0)
Anion gap: 12 (ref 5–15)
BILIRUBIN TOTAL: 1 mg/dL (ref 0.3–1.2)
BUN: 10 mg/dL (ref 6–20)
CALCIUM: 9.1 mg/dL (ref 8.9–10.3)
CO2: 22 mmol/L (ref 22–32)
Chloride: 101 mmol/L (ref 101–111)
Creatinine, Ser: 0.82 mg/dL (ref 0.44–1.00)
Glucose, Bld: 157 mg/dL — ABNORMAL HIGH (ref 65–99)
Potassium: 3.9 mmol/L (ref 3.5–5.1)
Sodium: 135 mmol/L (ref 135–145)
Total Protein: 6.8 g/dL (ref 6.5–8.1)

## 2017-03-12 LAB — PROTIME-INR
INR: 1.28
PROTHROMBIN TIME: 15.8 s — AB (ref 11.4–15.2)

## 2017-03-12 LAB — I-STAT BETA HCG BLOOD, ED (MC, WL, AP ONLY): I-stat hCG, quantitative: <5 m[IU]/mL (ref <5)

## 2017-03-12 LAB — APTT: aPTT: 25 seconds (ref 24–36)

## 2017-03-12 LAB — I-STAT CG4 LACTIC ACID, ED: Lactic Acid, Venous: 1.18 mmol/L (ref 0.5–1.9)

## 2017-03-12 LAB — LIPASE, BLOOD: Lipase: 19 U/L (ref 11–51)

## 2017-03-12 MED ORDER — PANTOPRAZOLE SODIUM 40 MG PO TBEC
40.0000 mg | DELAYED_RELEASE_TABLET | Freq: Every day | ORAL | Status: DC
Start: 2017-03-13 — End: 2017-03-15
  Administered 2017-03-13 – 2017-03-15 (×3): 40 mg via ORAL
  Filled 2017-03-12 (×3): qty 1

## 2017-03-12 MED ORDER — ONDANSETRON HCL 4 MG PO TABS: 4.0000 mg | ORAL_TABLET | Freq: Four times a day (QID) | ORAL | Status: DC | PRN

## 2017-03-12 MED ORDER — PREGABALIN 100 MG PO CAPS
200.0000 mg | ORAL_CAPSULE | Freq: Every day | ORAL | Status: DC
  Administered 2017-03-13 – 2017-03-15 (×3): 200 mg via ORAL
  Filled 2017-03-12 (×3): qty 2

## 2017-03-12 MED ORDER — NITROGLYCERIN 0.4 MG SL SUBL: 0.4000 mg | SUBLINGUAL_TABLET | SUBLINGUAL | Status: DC | PRN

## 2017-03-12 MED ORDER — HYDROMORPHONE HCL 1 MG/ML IJ SOLN
1.0000 mg | Freq: Once | INTRAMUSCULAR | Status: AC
  Administered 2017-03-12: 1 mg via INTRAVENOUS
  Filled 2017-03-12: qty 1

## 2017-03-12 MED ORDER — ONDANSETRON HCL 4 MG/2ML IJ SOLN
4.0000 mg | Freq: Four times a day (QID) | INTRAMUSCULAR | Status: DC | PRN
  Administered 2017-03-14: 4 mg via INTRAVENOUS
  Filled 2017-03-12: qty 2

## 2017-03-12 MED ORDER — ASPIRIN EC 81 MG PO TBEC
81.0000 mg | DELAYED_RELEASE_TABLET | Freq: Every day | ORAL | Status: DC
Start: 2017-03-13 — End: 2017-03-15
  Administered 2017-03-13 – 2017-03-15 (×3): 81 mg via ORAL
  Filled 2017-03-12 (×3): qty 1

## 2017-03-12 MED ORDER — RIVAROXABAN 10 MG PO TABS
20.0000 mg | ORAL_TABLET | Freq: Every day | ORAL | Status: DC
  Administered 2017-03-13 – 2017-03-15 (×3): 20 mg via ORAL
  Filled 2017-03-12 (×3): qty 2

## 2017-03-12 MED ORDER — HYDROMORPHONE HCL 1 MG/ML IJ SOLN
1.0000 mg | INTRAMUSCULAR | Status: DC | PRN
  Administered 2017-03-13: 1 mg via INTRAVENOUS
  Filled 2017-03-12: qty 1

## 2017-03-12 MED ORDER — CITALOPRAM HYDROBROMIDE 20 MG PO TABS
20.0000 mg | ORAL_TABLET | Freq: Every day | ORAL | Status: DC
  Administered 2017-03-13 – 2017-03-15 (×3): 20 mg via ORAL
  Filled 2017-03-12 (×3): qty 1

## 2017-03-12 MED ORDER — CYCLOBENZAPRINE HCL 5 MG PO TABS
5.0000 mg | ORAL_TABLET | Freq: Three times a day (TID) | ORAL | Status: DC
  Administered 2017-03-13 – 2017-03-15 (×8): 5 mg via ORAL
  Filled 2017-03-12 (×8): qty 1

## 2017-03-12 MED ORDER — HYDROXYZINE HCL 25 MG PO TABS: 25.0000 mg | ORAL_TABLET | Freq: Three times a day (TID) | ORAL | Status: DC | PRN

## 2017-03-12 MED ORDER — SIMETHICONE 80 MG PO CHEW
80.0000 mg | CHEWABLE_TABLET | Freq: Two times a day (BID) | ORAL | Status: DC | PRN
  Filled 2017-03-12: qty 2

## 2017-03-12 MED ORDER — LORAZEPAM 0.5 MG PO TABS: 0.5000 mg | ORAL_TABLET | Freq: Three times a day (TID) | ORAL | Status: DC | PRN

## 2017-03-12 MED ORDER — ADULT MULTIVITAMIN W/MINERALS CH
1.0000 | ORAL_TABLET | Freq: Every day | ORAL | Status: DC
  Administered 2017-03-13 – 2017-03-15 (×3): 1 via ORAL
  Filled 2017-03-12 (×3): qty 1

## 2017-03-12 MED ORDER — MORPHINE SULFATE (PF) 4 MG/ML IV SOLN: 4.0000 mg | INTRAVENOUS | Status: DC | PRN

## 2017-03-12 MED ORDER — ACETAMINOPHEN 325 MG PO TABS
650.0000 mg | ORAL_TABLET | Freq: Four times a day (QID) | ORAL | Status: DC | PRN
  Administered 2017-03-13: 650 mg via ORAL
  Filled 2017-03-12: qty 2

## 2017-03-12 MED ORDER — ONDANSETRON HCL 4 MG/2ML IJ SOLN
4.0000 mg | Freq: Once | INTRAMUSCULAR | Status: AC
  Administered 2017-03-12: 4 mg via INTRAVENOUS
  Filled 2017-03-12: qty 2

## 2017-03-12 MED ORDER — VITAMIN B-12 1000 MCG PO TABS
2500.0000 ug | ORAL_TABLET | Freq: Every day | ORAL | Status: DC
  Administered 2017-03-13 – 2017-03-15 (×3): 2500 ug via ORAL
  Filled 2017-03-12 (×3): qty 3

## 2017-03-12 MED ORDER — INSULIN DETEMIR 100 UNIT/ML ~~LOC~~ SOLN
10.0000 [IU] | Freq: Every day | SUBCUTANEOUS | Status: DC
  Administered 2017-03-13 – 2017-03-14 (×3): 10 [IU] via SUBCUTANEOUS
  Filled 2017-03-12 (×4): qty 0.1

## 2017-03-12 MED ORDER — OXYBUTYNIN CHLORIDE ER 5 MG PO TB24
10.0000 mg | ORAL_TABLET | Freq: Every morning | ORAL | Status: DC
  Administered 2017-03-13 – 2017-03-15 (×3): 10 mg via ORAL
  Filled 2017-03-12 (×3): qty 2

## 2017-03-12 MED ORDER — POLYETHYLENE GLYCOL 3350 17 G PO PACK
17.0000 g | PACK | Freq: Every day | ORAL | Status: DC | PRN
  Administered 2017-03-14: 17 g via ORAL
  Filled 2017-03-12: qty 1

## 2017-03-12 MED ORDER — INSULIN ASPART 100 UNIT/ML ~~LOC~~ SOLN
0.0000 [IU] | Freq: Three times a day (TID) | SUBCUTANEOUS | Status: DC
  Administered 2017-03-13: 12:00:00 3 [IU] via SUBCUTANEOUS
  Administered 2017-03-14: 13:00:00 2 [IU] via SUBCUTANEOUS

## 2017-03-12 MED ORDER — FAMOTIDINE 20 MG PO TABS
20.0000 mg | ORAL_TABLET | Freq: Two times a day (BID) | ORAL | Status: DC
  Administered 2017-03-13 – 2017-03-15 (×6): 20 mg via ORAL
  Filled 2017-03-12 (×6): qty 1

## 2017-03-12 MED ORDER — PROCHLORPERAZINE MALEATE 10 MG PO TABS
10.0000 mg | ORAL_TABLET | Freq: Four times a day (QID) | ORAL | Status: DC | PRN
  Filled 2017-03-12: qty 1

## 2017-03-12 MED ORDER — ACETAMINOPHEN 650 MG RE SUPP: 650.0000 mg | Freq: Four times a day (QID) | RECTAL | Status: DC | PRN

## 2017-03-12 MED ORDER — LACTATED RINGERS IV BOLUS (SEPSIS)
1000.0000 mL | Freq: Once | INTRAVENOUS | Status: AC
  Administered 2017-03-12: 1000 mL via INTRAVENOUS

## 2017-03-12 NOTE — H&P (Signed)
History and Physical    Heather Frederick UEA:[id number] DOB: 1956-01-13 DOA: 03/12/2017  PCP: [name], MD  Patient coming from: Home.  Chief Complaint: Abdominal pain.  HPI: Heather Frederick is a 61 y.o. female with history of metastatic pancreatic cancer presents with worsening abdominal pain.  Patient had come to the ER 24 hours ago with similar complaints and at that time patient had CT angiogram of the chest and abdomen which showed progression of pain since known pancreatic cancer.  The patient had gone home after pain was controlled with IV medication but since going home patient's pain came back again.  Pain is mostly in the periumbilical area.  Had some nausea.  Denies any diarrhea.  ED Course: In the ER patient had required multiple doses of IV pain medication and since patient is having intractable pain patient is being admitted for further observation.  Review of Systems: As per HPI, rest all negative.   Past Medical History:  Diagnosis Date  . Allergy   . Anxiety   . Arthritis   . Benign essential HTN 09/23/2014  . Cancer ([place])   . Chronic kidney disease    uti  . Depression   . Dyslipidemia   . Elevated liver enzymes   . Family history of anesthesia complication    father has a severe hard time waking up  . Gallstones    a. Seen on CT 01/2014.  Marland Kitchen GERD (gastroesophageal reflux disease)   . Hard of hearing   . Hepatic steatosis   . History of frequent urinary tract infections   . Hyperlipidemia   . Hypertension   . Lateral epicondylitis of right elbow   . Mental disorder   . Meralgia paresthetica of right side 10/02/2012   slight at 05/2014  . Migraine headache   . Obesity   . OSA (obstructive sleep apnea)    severe with AHI 37/hr now on CPAP at 18cm H2O  . Osteoarthritis   . Pneumonia    Feb 2018  . Raynaud disease    in feet per patient   . Sepsis ([place]) 02/23/2017  . Sleep apnea    wears c-pap  . Urinary tract infection     Past Surgical History:    Procedure Laterality Date  . ABDOMINAL HYSTERECTOMY  1/04   partial  . BLADDER SUSPENSION  6/10  . CARDIAC CATHETERIZATION    . carpel tunnel left/right  7/08, 8/08 Bilateral 8/08 and 7/08  . carpel tunnel rel Right 4/12  . CHOLECYSTECTOMY N/A 06/11/2014   Procedure: LAPAROSCOPIC CHOLECYSTECTOMY WITH ATTEMPTED INTRAOPERATIVE CHOLANGIOGRAM;  Surgeon: Jackolyn Confer, MD;  Location: [facility];  Service: General;  Laterality: N/A;  . COLONOSCOPY    . ERCP N/A 10/19/2014   Procedure: ENDOSCOPIC RETROGRADE CHOLANGIOPANCREATOGRAPHY (ERCP);  Surgeon: Ladene Artist, MD;  Location: [facility];  Service: Endoscopy;  Laterality: N/A;  . INCISIONAL HERNIA REPAIR N/A 06/30/2016   Procedure: LAPAROSCOPIC REPAIR OF INCISIONAL HERNIA WITH MESH;  Surgeon: Jackolyn Confer, MD;  Location: [facility];  Service: General;  Laterality: N/A;  . INSERTION OF MESH N/A 06/30/2016   Procedure: INSERTION OF MESH;  Surgeon: Jackolyn Confer, MD;  Location: [facility];  Service: General;  Laterality: N/A;  . IR CV LINE INJECTION  11/24/2016  . IR FLUORO GUIDE PORT INSERTION RIGHT  08/28/2016  . IR US GUIDE VASC ACCESS RIGHT  08/28/2016  . JOINT REPLACEMENT    . KNEE ARTHROSCOPY Left 12/12  . KNEE ARTHROSCOPY Right 12/06  .  LEFT HEART CATHETERIZATION WITH CORONARY ANGIOGRAM N/A 03/10/2014   Procedure: LEFT HEART CATHETERIZATION WITH CORONARY ANGIOGRAM;  Surgeon: Sinclair Grooms, MD;  Location: [facility];  Service: Cardiovascular;  Laterality: N/A;  . PLANTAR FASCIA RELEASE Right 12/10  . radial tunnel release     right arm   . ROTATOR CUFF REPAIR Left 6/11  . tennis elbow release Right 7/04  . TOTAL KNEE ARTHROPLASTY Left 09/10/2012   Procedure: TOTAL KNEE ARTHROPLASTY- left;  Surgeon: Garald Balding, MD;  Location: [place];  Service: Orthopedics;  Laterality: Left;  Left total knee arthroplasty     reports that  has never smoked. she has never used smokeless tobacco. She reports that she drinks alcohol. She reports that she  does not use drugs.  Allergies  Allergen Reactions  . Topamax [Topiramate] Other (See Comments)    :Stroke like symptoms  . Aleve [Naproxen Sodium] Hives    Has tolerated Voltaren topical as well as aspirin.  . Bee Venom Swelling  . Echinacea Hives  . Other Other (See Comments)    Feathers cause sinus congestion  . Sulfa Antibiotics Hives  . Advil [Ibuprofen] Hives    Has tolerated Voltaren topical as well as aspirin.    Family History  Problem Relation Age of Onset  . Hypertension Mother   . Stroke Mother   . Liver disease Mother        Abcess  . Hypertension Father   . Coronary artery disease Father   . Migraines Father   . Clotting disorder Father   . Kidney failure Brother   . Hypertension Brother   . Migraines Brother   . Migraines Daughter   . Breast cancer Other        Niece with breast cancer  . Colon cancer Paternal Grandmother   . Pancreatic cancer Paternal Grandmother   . Stomach cancer Paternal Grandmother   . Breast cancer Cousin   . Esophageal cancer Neg Hx   . Rectal cancer Neg Hx     Prior to Admission medications   Medication Sig Start Date End Date Taking? Authorizing Provider  acetaminophen (TYLENOL) 650 MG CR tablet Take 1,300 mg by mouth every 8 (eight) hours as needed for pain.    [provider]  aspirin EC 81 MG tablet Take 81 mg by mouth daily.    [provider]  citalopram (CELEXA) 20 MG tablet TAKE ONE (1) TABLET BY MOUTH EVERY DAY 01/22/17   Hoyt Koch, MD  Cranberry-Vitamin C-Vitamin E (CRANBERRY PLUS VITAMIN C PO) Take 1 tablet by mouth daily. 4200 mg    [provider]  cyclobenzaprine (FLEXERIL) 5 MG tablet Take 1 tablet (5 mg total) 3 (three) times daily as needed by mouth for muscle spasms. Patient taking differently: Take 5 mg by mouth 3 (three) times daily.  02/02/17   Tanner, Lyndon Code., PA-C  eletriptan (RELPAX) 40 MG tablet Take 1 tablet (40 mg total) by mouth every 2 (two) hours as needed for  migraine. 06/26/16   Dennie Bible, NP  HYDROcodone-acetaminophen (NORCO/VICODIN) 5-325 MG tablet Take 1 tablet by mouth every 6 (six) hours as needed for moderate pain.    [provider]  HYDROmorphone (DILAUDID) 4 MG tablet Take 1-2 tablets (4-8 mg total) every 4 (four) hours as needed by mouth for severe pain. 02/08/17   Ladell Pier, MD  hydrOXYzine (ATARAX/VISTARIL) 25 MG tablet Take 25 mg by mouth every 8 (eight) hours as needed for  itching.  09/18/16   [provider]  insulin detemir (LEVEMIR) 100 UNIT/ML injection Inject 0.1 mLs (10 Units total) at bedtime into the skin. 02/06/17   [name], MD  insulin lispro (HUMALOG) 100 UNIT/ML injection Inject 0-6 Units into the skin 3 (three) times daily before meals. Only takes when glucose is "very high"    [provider]  lidocaine-prilocaine (EMLA) cream Apply 1 application topically as needed. Apply to [facility] a Cath site one hour prior to needle stick. 08/29/16   Ladell Pier, MD  loratadine (CLARITIN) 10 MG tablet Take 10-40 mg by mouth daily as needed for allergies.     [provider]  LORazepam (ATIVAN) 0.5 MG tablet Take 1 tablet (0.5 mg total) by mouth every 8 (eight) hours as needed (for nausea). 02/19/17   Ladell Pier, MD  LYRICA 200 MG capsule TAKE ONE (1) CAPSULE BY MOUTH EVERY MORNING 12/18/16   Dennie Bible, NP  Multiple Vitamin (MULTIVITAMIN WITH MINERALS) TABS tablet Take 1 tablet by mouth daily.    [provider]  nitroGLYCERIN (NITROSTAT) 0.4 MG SL tablet Place 1 tablet (0.4 mg total) under the tongue every 5 (five) minutes as needed for chest pain. 06/28/15   Belva Crome, MD  ondansetron (ZOFRAN-ODT) 8 MG disintegrating tablet Take 1 tablet (8 mg total) by mouth every 8 (eight) hours as needed for nausea or vomiting. 09/18/16   Maryanna Shape, NP  oxybutynin (DITROPAN-XL) 10 MG 24 hr tablet Take 1 tablet (10 mg total) by mouth every morning. 03/06/17   Owens Shark, NP  oxyCODONE (OXY IR/ROXICODONE) 5 MG immediate release tablet Take 1 tablet (5 mg total) by mouth every 4 (four) hours as needed for severe pain. 02/27/17   Owens Shark, NP  pantoprazole (PROTONIX) 40 MG tablet TAKE ONE (1) TABLET BY MOUTH TWO (2) TIMES DAILY 09/25/16   Ladene Artist, MD  polyethylene glycol [facility] / Floria Raveling) packet Take 17 g by mouth daily as needed for mild constipation or moderate constipation. 02/24/17   Robbie Lis, MD  prochlorperazine (COMPAZINE) 10 MG tablet Take 10 mg by mouth every 6 (six) hours as needed for nausea or vomiting.    [provider]  ranitidine (ZANTAC) 300 MG tablet TAKE ONE TABLET BY MOUTH EVERY EVENING 03/07/17   Ladene Artist, MD  simethicone (MYLICON) 80 MG chewable tablet Chew 80-160 mg by mouth 2 (two) times daily as needed (stomach pain).    [provider]  vitamin B-12 (CYANOCOBALAMIN) 1000 MCG tablet Take 2,500 mcg by mouth every morning.    [provider]  XARELTO 20 MG TABS tablet Take 20 mg daily by mouth. 01/31/17   [provider]    Physical Exam: Vitals:   03/12/17 2010 03/12/17 2039 03/12/17 2200  BP:   105/71  Pulse:   96  Resp:   (!) 24  Temp:  (!) 97.5 F (36.4 C)   TempSrc:  Oral   SpO2: 97%  93%      Constitutional: Moderately built and nourished. Vitals:   03/12/17 2010 03/12/17 2039 03/12/17 2200  BP:   105/71  Pulse:   96  Resp:   (!) 24  Temp:  (!) 97.5 F (36.4 C)   TempSrc:  Oral   SpO2: 97%  93%   Eyes: Anicteric no pallor. ENMT: No discharge from the ears eyes nose or mouth. Neck: No mass felt.  No neck rigidity. Respiratory: No rhonchi  or crepitations. Cardiovascular: S1-S2 heard no murmurs appreciated. Abdomen: Soft nontender bowel sounds present. Musculoskeletal: No edema.  No joint effusion. Skin: No rash.  Skin appears warm. Neurologic: Alert awake oriented to time place and person.  Moves all extremities. Psychiatric: Appears normal.   Normal affect.   Labs on Admission: I have personally reviewed following labs and imaging studies  CBC: Recent Labs  Lab 03/06/17 1105 03/11/17 1207 03/12/17 2129  WBC 4.2 4.5 6.3  NEUTROABS 2.2  --  4.5  HGB 10.1* 10.6* 10.3*  HCT 32.1* 32.8* 31.8*  MCV 86.8 85.9 84.4  PLT 203 146* 948*   Basic Metabolic Panel: Recent Labs  Lab 03/06/17 1105 03/11/17 1207 03/12/17 2129  NA 135* 130* 135  K 4.2 3.7 3.9  CL  --  95* 101  CO2 23 25 22   GLUCOSE 238* 164* 157*  BUN 7.9 14 10   CREATININE 0.9 0.65 0.82  CALCIUM 9.2 8.7* 9.1   GFR: Estimated Creatinine Clearance: 92.2 mL/min (by C-G formula based on SCr of 0.82 mg/dL). Liver Function Tests: Recent Labs  Lab 03/06/17 1105 03/11/17 1207 03/12/17 2129  AST 26 21 21   ALT 21 24 21   ALKPHOS 155* 155* 172*  BILITOT 0.44 1.1 1.0  PROT 6.8 6.8 6.8  ALBUMIN 2.7* 2.6* 2.7*   Recent Labs  Lab 03/11/17 1207 03/12/17 2129  LIPASE 19 19   No results for input(s): AMMONIA in the last 168 hours. Coagulation Profile: Recent Labs  Lab 03/12/17 2129  INR 1.28   Cardiac Enzymes: No results for input(s): CKTOTAL, CKMB, CKMBINDEX, TROPONINI in the last 168 hours. BNP (last 3 results) No results for input(s): PROBNP in the last 8760 hours. HbA1C: No results for input(s): HGBA1C in the last 72 hours. CBG: No results for input(s): GLUCAP in the last 168 hours. Lipid Profile: No results for input(s): CHOL, HDL, LDLCALC, TRIG, CHOLHDL, LDLDIRECT in the last 72 hours. Thyroid Function Tests: No results for input(s): TSH, T4TOTAL, FREET4, T3FREE, THYROIDAB in the last 72 hours. Anemia Panel: No results for input(s): VITAMINB12, FOLATE, FERRITIN, TIBC, IRON, RETICCTPCT in the last 72 hours. Urine analysis:    Component Value Date/Time   COLORURINE YELLOW 03/11/2017 [place] 03/11/2017 1345   LABSPEC 1.032 (H) 03/11/2017 1345   LABSPEC 1.015 02/19/2017 1010   PHURINE 8.0 03/11/2017 1345   GLUCOSEU NEGATIVE  03/11/2017 1345   GLUCOSEU Negative 02/19/2017 1010   HGBUR NEGATIVE 03/11/2017 1345   BILIRUBINUR NEGATIVE 03/11/2017 1345   BILIRUBINUR Negative 02/19/2017 1010   KETONESUR NEGATIVE 03/11/2017 1345   PROTEINUR 100 (A) 03/11/2017 1345   UROBILINOGEN 0.2 02/19/2017 1010   NITRITE NEGATIVE 03/11/2017 1345   LEUKOCYTESUR NEGATIVE 03/11/2017 1345   LEUKOCYTESUR Trace 02/19/2017 1010   Sepsis Labs: @LABRCNTIP (procalcitonin:4,lacticidven:4) )No results found for this or any previous visit (from the past 240 hour(s)).   Radiological Exams on Admission: Ct Angio Chest Pe W And/or Wo Contrast  Result Date: 03/11/2017 CLINICAL DATA:  61 year old female with pancreatic cancer undergoing treatment. Abdominal pain and nausea since yesterday. Chest pain and shortness of breath. EXAM: CT ANGIOGRAPHY CHEST CT ABDOMEN AND PELVIS WITH CONTRAST TECHNIQUE: Multidetector CT imaging of the chest was performed using the standard protocol during bolus administration of intravenous contrast. Multiplanar CT image reconstructions and MIPs were obtained to evaluate the vascular anatomy. Multidetector CT imaging of the abdomen and pelvis was performed using the standard protocol during bolus administration of intravenous contrast. CONTRAST:  177mL ISOVUE-370 IOPAMIDOL (ISOVUE-370) INJECTION 76%  COMPARISON:  CT Abdomen and Pelvis 01/01/2017.  Chest CT 12/06/2016. FINDINGS: CTA CHEST FINDINGS Cardiovascular: Good contrast bolus timing in the pulmonary arterial tree. Mild to moderate respiratory motion. No convincing pulmonary artery filling defect to suggest acute pulmonary embolus. No pericardial effusion. Negative thoracic aorta. Right chest IJ approach porta cath in place. Mediastinum/Nodes: No mediastinal lymphadenopathy. Lungs/Pleura: Chronic elevation of the right hemidiaphragm. Lower lung volumes compared to the September chest CT. Increased right upper lobe pulmonary nodule since September on series 14, image 41, now  8 mm (previously 4 mm). Small lower lobe subpleural nodules appear more stable. There is dependent atelectasis. There is confluent right lower lobe atelectasis. No pleural effusion. Musculoskeletal: No acute or suspicious osseous lesion identified in the chest. Review of the MIP images confirms the above findings. CT ABDOMEN and PELVIS FINDINGS Hepatobiliary: Progressed liver metastatic disease since October. Numerous larger and more discrete hypodense, rim enhancing liver masses individually measuring up to 3.5-4 cm diameter. Surgically absent gallbladder. Pancreas: Heterogeneously enhancing body pancreatic mass appears larger than in October. Today this is 45 x 47 by 43 mm (AP by transverse by CC) versus 28 x 49 by about 31 mm previously. There is atrophy and ductal dilatation in the pancreatic tail. There is mild peripancreatic fat stranding. Small peripancreatic lymph nodes appear stable. No vascular occlusion about the mass. Spleen: Negative. Adrenals/Urinary Tract: Adrenal glands remain within normal limits. Bilateral renal enhancement and contrast excretion is within normal limits. Unremarkable urinary bladder. Stomach/Bowel: Mild retained stool in the distal colon. Redundant sigmoid tracking into the left mid abdomen. Mild redundancy of the transverse colon. No large bowel inflammation. Negative appendix. No dilated or inflamed small bowel. There is a 5 cm small bowel diverticulum suspected in the proximal jejunum (series 4, image 39) which was smaller and less apparent in October (on series 3, image 69 of that study). No surrounding inflammation. Prior ventral abdominal hernia repair with mesh. Decompressed stomach. 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 The duodenum appears stable and otherwise within normal limits. No abdominal free fluid. Vascular/Lymphatic: Major arterial structures in the abdomen and pelvis are patent. Portal venous system is patent. Outside of the peripancreatic region there is no  abdominal or pelvic lymphadenopathy. Reproductive: Surgically absent uterus.  Negative ovaries. Other: No pelvic free fluid. Musculoskeletal: No acute or suspicious osseous lesion identified. Review of the MIP images confirms the above findings. IMPRESSION: 1.  Negative for acute pulmonary embolus. 2. Progressed primary pancreatic tumor and liver metastases since 01/01/2017. 3. Increased solitary right upper lobe lung nodule since September suspicious for pulmonary oligometastatic disease. 4. Lower lung volumes with atelectasis.  No pleural effusion. 5. No bowel obstruction or other acute findings identified. Electronically Signed   By: Genevie Ann M.D.   On: 03/11/2017 14:08   Ct Abdomen Pelvis W Contrast  Result Date: 03/11/2017 CLINICAL DATA:  61 year old female with pancreatic cancer undergoing treatment. Abdominal pain and nausea since yesterday. Chest pain and shortness of breath. EXAM: CT ANGIOGRAPHY CHEST CT ABDOMEN AND PELVIS WITH CONTRAST TECHNIQUE: Multidetector CT imaging of the chest was performed using the standard protocol during bolus administration of intravenous contrast. Multiplanar CT image reconstructions and MIPs were obtained to evaluate the vascular anatomy. Multidetector CT imaging of the abdomen and pelvis was performed using the standard protocol during bolus administration of intravenous contrast. CONTRAST:  168mL ISOVUE-370 IOPAMIDOL (ISOVUE-370) INJECTION 76% COMPARISON:  CT Abdomen and Pelvis 01/01/2017.  Chest CT 12/06/2016. FINDINGS: CTA CHEST FINDINGS Cardiovascular: Good contrast bolus timing in  the pulmonary arterial tree. Mild to moderate respiratory motion. No convincing pulmonary artery filling defect to suggest acute pulmonary embolus. No pericardial effusion. Negative thoracic aorta. Right chest IJ approach porta cath in place. Mediastinum/Nodes: No mediastinal lymphadenopathy. Lungs/Pleura: Chronic elevation of the right hemidiaphragm. Lower lung volumes compared to the  September chest CT. Increased right upper lobe pulmonary nodule since September on series 14, image 41, now 8 mm (previously 4 mm). Small lower lobe subpleural nodules appear more stable. There is dependent atelectasis. There is confluent right lower lobe atelectasis. No pleural effusion. Musculoskeletal: No acute or suspicious osseous lesion identified in the chest. Review of the MIP images confirms the above findings. CT ABDOMEN and PELVIS FINDINGS Hepatobiliary: Progressed liver metastatic disease since October. Numerous larger and more discrete hypodense, rim enhancing liver masses individually measuring up to 3.5-4 cm diameter. Surgically absent gallbladder. Pancreas: Heterogeneously enhancing body pancreatic mass appears larger than in October. Today this is 45 x 47 by 43 mm (AP by transverse by CC) versus 28 x 49 by about 31 mm previously. There is atrophy and ductal dilatation in the pancreatic tail. There is mild peripancreatic fat stranding. Small peripancreatic lymph nodes appear stable. No vascular occlusion about the mass. Spleen: Negative. Adrenals/Urinary Tract: Adrenal glands remain within normal limits. Bilateral renal enhancement and contrast excretion is within normal limits. Unremarkable urinary bladder. Stomach/Bowel: Mild retained stool in the distal colon. Redundant sigmoid tracking into the left mid abdomen. Mild redundancy of the transverse colon. No large bowel inflammation. Negative appendix. No dilated or inflamed small bowel. There is a 5 cm small bowel diverticulum suspected in the proximal jejunum (series 4, image 39) which was smaller and less apparent in October (on series 3, image 69 of that study). No surrounding inflammation. Prior ventral abdominal hernia repair with mesh. Decompressed stomach. 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 The duodenum appears stable and otherwise within normal limits. No abdominal free fluid. Vascular/Lymphatic: Major arterial structures in the  abdomen and pelvis are patent. Portal venous system is patent. Outside of the peripancreatic region there is no abdominal or pelvic lymphadenopathy. Reproductive: Surgically absent uterus.  Negative ovaries. Other: No pelvic free fluid. Musculoskeletal: No acute or suspicious osseous lesion identified. Review of the MIP images confirms the above findings. IMPRESSION: 1.  Negative for acute pulmonary embolus. 2. Progressed primary pancreatic tumor and liver metastases since 01/01/2017. 3. Increased solitary right upper lobe lung nodule since September suspicious for pulmonary oligometastatic disease. 4. Lower lung volumes with atelectasis.  No pleural effusion. 5. No bowel obstruction or other acute findings identified. Electronically Signed   By: Genevie Ann M.D.   On: 03/11/2017 14:08     Assessment/Plan Principal Problem:   Abdominal pain, generalized Active Problems:   Diastolic heart failure, stage B (HCC)   OSA (obstructive sleep apnea)   Benign essential HTN   Chronic anticoagulation   Pain   Abdominal pain    1. Intractable abdominal pain likely from progression of patient's known pancreatic cancer -I have placed patient on as needed IV Dilaudid for now.  Will need to get input from Dr. Learta Codding patient's oncologist.  I have listed Dr. Learta Codding as a consultant. 2. 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 3. Diabetes mellitus type 2 on Levemir.  Closely follow CBGs since patient's intake may not be adequate due to pain. 4. Sleep apnea on CPAP. 5. History of diastolic failure per the chart.  Appears to be considered. 6. Anemia and thrombocytopenia likely from chemotherapy.  DVT prophylaxis: Xarelto. Code Status: Full code. Family Communication: Patient's husband. Disposition Plan: To be determined. Consults called: None. Admission status: Observation.   Rise Patience MD [facility] Pager [phone].  If 7PM-7AM, please contact  night-coverage [email or web address] Password [email or web address]  03/12/2017, 11:45 PM

## 2017-03-12 NOTE — ED Notes (Signed)
Bed: XB28 Expected date:  Expected time:  Means of arrival:  Comments: 61 yr old abd pain, liver cancer

## 2017-03-12 NOTE — ED Triage Notes (Signed)
PT BIB [facility]. PT c/o abdominal pain has hx of liver and lung CA. Was seen [facility] yesterday for same. 100 mcg fentanyl en route, 4 mg zofran. Pt reports relief with same.

## 2017-03-12 NOTE — Telephone Encounter (Signed)
Called to check on patient after ED visit. Spoke with husband and he reports "she is doing better". Instructed to call with any questions or concerns.

## 2017-03-12 NOTE — ED Notes (Signed)
Nurse is in the room and is going to access patient port

## 2017-03-12 NOTE — ED Notes (Signed)
Denies N/V/D. States that her abdomen "just hurts."

## 2017-03-13 ENCOUNTER — Other Ambulatory Visit: Payer: Self-pay

## 2017-03-13 ENCOUNTER — Telehealth: Payer: Self-pay | Admitting: *Deleted

## 2017-03-13 DIAGNOSIS — I7300 Raynaud's syndrome without gangrene: Secondary | ICD-10-CM | POA: Diagnosis present

## 2017-03-13 DIAGNOSIS — H9190 Unspecified hearing loss, unspecified ear: Secondary | ICD-10-CM | POA: Diagnosis present

## 2017-03-13 DIAGNOSIS — D6959 Other secondary thrombocytopenia: Secondary | ICD-10-CM | POA: Diagnosis present

## 2017-03-13 DIAGNOSIS — G893 Neoplasm related pain (acute) (chronic): Secondary | ICD-10-CM | POA: Diagnosis present

## 2017-03-13 DIAGNOSIS — G4733 Obstructive sleep apnea (adult) (pediatric): Secondary | ICD-10-CM

## 2017-03-13 DIAGNOSIS — I5030 Unspecified diastolic (congestive) heart failure: Secondary | ICD-10-CM | POA: Diagnosis not present

## 2017-03-13 DIAGNOSIS — I5032 Chronic diastolic (congestive) heart failure: Secondary | ICD-10-CM | POA: Diagnosis present

## 2017-03-13 DIAGNOSIS — E1122 Type 2 diabetes mellitus with diabetic chronic kidney disease: Secondary | ICD-10-CM | POA: Diagnosis present

## 2017-03-13 DIAGNOSIS — R112 Nausea with vomiting, unspecified: Secondary | ICD-10-CM | POA: Diagnosis present

## 2017-03-13 DIAGNOSIS — C251 Malignant neoplasm of body of pancreas: Secondary | ICD-10-CM

## 2017-03-13 DIAGNOSIS — F419 Anxiety disorder, unspecified: Secondary | ICD-10-CM | POA: Diagnosis present

## 2017-03-13 DIAGNOSIS — I951 Orthostatic hypotension: Secondary | ICD-10-CM | POA: Diagnosis present

## 2017-03-13 DIAGNOSIS — B370 Candidal stomatitis: Secondary | ICD-10-CM | POA: Diagnosis present

## 2017-03-13 DIAGNOSIS — N189 Chronic kidney disease, unspecified: Secondary | ICD-10-CM | POA: Diagnosis present

## 2017-03-13 DIAGNOSIS — Z96652 Presence of left artificial knee joint: Secondary | ICD-10-CM | POA: Diagnosis present

## 2017-03-13 DIAGNOSIS — C787 Secondary malignant neoplasm of liver and intrahepatic bile duct: Secondary | ICD-10-CM | POA: Diagnosis present

## 2017-03-13 DIAGNOSIS — I10 Essential (primary) hypertension: Secondary | ICD-10-CM | POA: Diagnosis not present

## 2017-03-13 DIAGNOSIS — D6481 Anemia due to antineoplastic chemotherapy: Secondary | ICD-10-CM | POA: Diagnosis present

## 2017-03-13 DIAGNOSIS — T451X5A Adverse effect of antineoplastic and immunosuppressive drugs, initial encounter: Secondary | ICD-10-CM | POA: Diagnosis present

## 2017-03-13 DIAGNOSIS — R21 Rash and other nonspecific skin eruption: Secondary | ICD-10-CM | POA: Diagnosis present

## 2017-03-13 DIAGNOSIS — Z87440 Personal history of urinary (tract) infections: Secondary | ICD-10-CM | POA: Diagnosis not present

## 2017-03-13 DIAGNOSIS — G43909 Migraine, unspecified, not intractable, without status migrainosus: Secondary | ICD-10-CM | POA: Diagnosis present

## 2017-03-13 DIAGNOSIS — C259 Malignant neoplasm of pancreas, unspecified: Secondary | ICD-10-CM | POA: Diagnosis present

## 2017-03-13 DIAGNOSIS — M545 Low back pain: Secondary | ICD-10-CM | POA: Diagnosis present

## 2017-03-13 DIAGNOSIS — I129 Hypertensive chronic kidney disease with stage 1 through stage 4 chronic kidney disease, or unspecified chronic kidney disease: Secondary | ICD-10-CM | POA: Diagnosis present

## 2017-03-13 DIAGNOSIS — F329 Major depressive disorder, single episode, unspecified: Secondary | ICD-10-CM | POA: Diagnosis present

## 2017-03-13 DIAGNOSIS — R1084 Generalized abdominal pain: Secondary | ICD-10-CM | POA: Diagnosis not present

## 2017-03-13 DIAGNOSIS — E669 Obesity, unspecified: Secondary | ICD-10-CM | POA: Diagnosis present

## 2017-03-13 DIAGNOSIS — R109 Unspecified abdominal pain: Secondary | ICD-10-CM | POA: Diagnosis present

## 2017-03-13 LAB — CBC
HCT: 29.4 % — ABNORMAL LOW (ref 36.0–46.0)
HEMOGLOBIN: 9.6 g/dL — AB (ref 12.0–15.0)
MCH: 27.8 pg (ref 26.0–34.0)
MCHC: 32.7 g/dL (ref 30.0–36.0)
MCV: 85.2 fL (ref 78.0–100.0)
Platelets: 113 10*3/uL — ABNORMAL LOW (ref 150–400)
RBC: 3.45 MIL/uL — ABNORMAL LOW (ref 3.87–5.11)
RDW: 15.5 % (ref 11.5–15.5)
WBC: 5.1 10*3/uL (ref 4.0–10.5)

## 2017-03-13 LAB — URINALYSIS, ROUTINE W REFLEX MICROSCOPIC
BACTERIA UA: NONE SEEN
BILIRUBIN URINE: NEGATIVE
GLUCOSE, UA: NEGATIVE mg/dL
HGB URINE DIPSTICK: NEGATIVE
Ketones, ur: 80 mg/dL — AB
LEUKOCYTES UA: NEGATIVE
NITRITE: NEGATIVE
Protein, ur: 100 mg/dL — AB
SPECIFIC GRAVITY, URINE: 1.021 (ref 1.005–1.030)
pH: 8 (ref 5.0–8.0)

## 2017-03-13 LAB — BASIC METABOLIC PANEL
ANION GAP: 7 (ref 5–15)
BUN: 9 mg/dL (ref 6–20)
CALCIUM: 8.8 mg/dL — AB (ref 8.9–10.3)
CO2: 25 mmol/L (ref 22–32)
Chloride: 103 mmol/L (ref 101–111)
Creatinine, Ser: 0.64 mg/dL (ref 0.44–1.00)
GLUCOSE: 135 mg/dL — AB (ref 65–99)
Potassium: 3.8 mmol/L (ref 3.5–5.1)
Sodium: 135 mmol/L (ref 135–145)

## 2017-03-13 LAB — GLUCOSE, CAPILLARY
GLUCOSE-CAPILLARY: 106 mg/dL — AB (ref 65–99)
GLUCOSE-CAPILLARY: 213 mg/dL — AB (ref 65–99)
GLUCOSE-CAPILLARY: 178 mg/dL — AB (ref 65–99)
Glucose-Capillary: 161 mg/dL — ABNORMAL HIGH (ref 65–99)
Glucose-Capillary: 108 mg/dL — ABNORMAL HIGH (ref 65–99)

## 2017-03-13 MED ORDER — HYDROMORPHONE HCL 2 MG PO TABS
4.0000 mg | ORAL_TABLET | ORAL | Status: DC | PRN
  Administered 2017-03-13 – 2017-03-14 (×4): 4 mg via ORAL
  Filled 2017-03-13 (×4): qty 2

## 2017-03-13 MED ORDER — MORPHINE SULFATE ER 30 MG PO TBCR
30.0000 mg | EXTENDED_RELEASE_TABLET | Freq: Two times a day (BID) | ORAL | Status: DC
  Administered 2017-03-13 – 2017-03-15 (×5): 30 mg via ORAL
  Filled 2017-03-13 (×5): qty 1

## 2017-03-13 MED ORDER — PROCHLORPERAZINE MALEATE 10 MG PO TABS
10.0000 mg | ORAL_TABLET | Freq: Four times a day (QID) | ORAL | Status: DC | PRN
  Filled 2017-03-13: qty 1

## 2017-03-13 MED ORDER — MORPHINE SULFATE ER 30 MG PO TBCR: 30.0000 mg | EXTENDED_RELEASE_TABLET | Freq: Two times a day (BID) | ORAL | Status: DC

## 2017-03-13 NOTE — ED Notes (Signed)
Bed assigned @ 0043 rm 1621

## 2017-03-13 NOTE — ED Provider Notes (Signed)
[facility] Provider Note   CSN: [id number] Arrival date & time: 03/12/17  1959     History   Chief Complaint Chief Complaint  Patient presents with  . Abdominal Pain    HPI Heather Frederick is a 61 y.o. female.  HPI  61 y.o. female  with a PMH of pancreatic cancer with known liver mets who presents to the Emergency Department complaining of abdominal pain. Patient reports that she woke up this morning with severe abdominal pain, that is located in the lower quadrant.  Patient's pain is constant and described as sharp pain with waxing and waning intensity.  Patient has no specific aggravating or relieving factors associated with the pain.  Patient denies any dysuria, hematuria, diarrhea, bloody stools.  No history of similar pain.  Patient does have associated diaphoresis and chills.  Of note, patient was seen in the ER yesterday with upper quadrant abdominal pain.  She had CT angios chest and CT abdomen pelvis with contrast which were negative for any acute findings.  Patient has been taking her narcotic pain medicine without any relief today.  Patient reports that in the past she has had similar pain after her chemo sessions, but the current episode is the worst.   Past Medical History:  Diagnosis Date  . Allergy   . Anxiety   . Arthritis   . Benign essential HTN 09/23/2014  . Cancer ([place])   . Chronic kidney disease    uti  . Depression   . Dyslipidemia   . Elevated liver enzymes   . Family history of anesthesia complication    father has a severe hard time waking up  . Gallstones    a. Seen on CT 01/2014.  Marland Kitchen GERD (gastroesophageal reflux disease)   . Hard of hearing   . Hepatic steatosis   . History of frequent urinary tract infections   . Hyperlipidemia   . Hypertension   . Lateral epicondylitis of right elbow   . Mental disorder   . Meralgia paresthetica of right side 10/02/2012   slight at 05/2014  . Migraine headache   .  Obesity   . OSA (obstructive sleep apnea)    severe with AHI 37/hr now on CPAP at 18cm H2O  . Osteoarthritis   . Pneumonia    Feb 2018  . Raynaud disease    in feet per patient   . Sepsis ([place]) 02/23/2017  . Sleep apnea    wears c-pap  . Urinary tract infection     Patient Active Problem List   Diagnosis Date Noted  . Pain 03/12/2017  . Abdominal pain, generalized 03/12/2017  . Abdominal pain 03/12/2017  . Sepsis ([place]) 02/24/2017  . Chronic anticoagulation 02/23/2017  . Fever 02/23/2017  . Dizziness 02/05/2017  . Portacath in place 10/18/2016  . Goals of care, counseling/discussion 09/08/2016  . Pancreatic carcinoma metastatic to liver ([place]) 08/29/2016  . Mid back pain 07/07/2016  . Incisional hernia 06/30/2016  . Edema extremities 03/07/2016  . Pain of upper abdomen 01/27/2016  . Depression 01/28/2015  . Bile duct stone   . Elevated LFTs   . Benign essential HTN 09/23/2014  . Routine general medical examination at a health care facility 08/01/2014  . OSA (obstructive sleep apnea) 07/04/2014  . Dyslipidemia 03/09/2014  . Diastolic heart failure, stage B ([place])   . GERD (gastroesophageal reflux disease)   . Meralgia paresthetica of right side 10/02/2012  . Osteoarthritis of left knee 09/12/2012  .  Migraines 09/12/2012  . Obesity, Class III, BMI 40-49.9 (morbid obesity) ([place]) 09/12/2012    Past Surgical History:  Procedure Laterality Date  . ABDOMINAL HYSTERECTOMY  1/04   partial  . BLADDER SUSPENSION  6/10  . CARDIAC CATHETERIZATION    . carpel tunnel left/right  7/08, 8/08 Bilateral 8/08 and 7/08  . carpel tunnel rel Right 4/12  . CHOLECYSTECTOMY N/A 06/11/2014   Procedure: LAPAROSCOPIC CHOLECYSTECTOMY WITH ATTEMPTED INTRAOPERATIVE CHOLANGIOGRAM;  Surgeon: Jackolyn Confer, MD;  Location: [facility];  Service: General;  Laterality: N/A;  . COLONOSCOPY    . ERCP N/A 10/19/2014   Procedure: ENDOSCOPIC RETROGRADE CHOLANGIOPANCREATOGRAPHY (ERCP);  Surgeon: Ladene Artist, MD;  Location: [facility];  Service: Endoscopy;  Laterality: N/A;  . INCISIONAL HERNIA REPAIR N/A 06/30/2016   Procedure: LAPAROSCOPIC REPAIR OF INCISIONAL HERNIA WITH MESH;  Surgeon: Jackolyn Confer, MD;  Location: [facility];  Service: General;  Laterality: N/A;  . INSERTION OF MESH N/A 06/30/2016   Procedure: INSERTION OF MESH;  Surgeon: Jackolyn Confer, MD;  Location: [facility];  Service: General;  Laterality: N/A;  . IR CV LINE INJECTION  11/24/2016  . IR FLUORO GUIDE PORT INSERTION RIGHT  08/28/2016  . IR US GUIDE VASC ACCESS RIGHT  08/28/2016  . JOINT REPLACEMENT    . KNEE ARTHROSCOPY Left 12/12  . KNEE ARTHROSCOPY Right 12/06  . LEFT HEART CATHETERIZATION WITH CORONARY ANGIOGRAM N/A 03/10/2014   Procedure: LEFT HEART CATHETERIZATION WITH CORONARY ANGIOGRAM;  Surgeon: Sinclair Grooms, MD;  Location: [facility];  Service: Cardiovascular;  Laterality: N/A;  . PLANTAR FASCIA RELEASE Right 12/10  . radial tunnel release     right arm   . ROTATOR CUFF REPAIR Left 6/11  . tennis elbow release Right 7/04  . TOTAL KNEE ARTHROPLASTY Left 09/10/2012   Procedure: TOTAL KNEE ARTHROPLASTY- left;  Surgeon: Garald Balding, MD;  Location: [facility];  Service: Orthopedics;  Laterality: Left;  Left total knee arthroplasty    OB History    No data available       Home Medications    Prior to Admission medications   Medication Sig Start Date End Date Taking? Authorizing Provider  acetaminophen (TYLENOL) 650 MG CR tablet Take 1,300 mg by mouth every 8 (eight) hours as needed for pain.    [provider]  aspirin EC 81 MG tablet Take 81 mg by mouth daily.    [provider]  citalopram (CELEXA) 20 MG tablet TAKE ONE (1) TABLET BY MOUTH EVERY DAY 01/22/17   Hoyt Koch, MD  Cranberry-Vitamin C-Vitamin E (CRANBERRY PLUS VITAMIN C PO) Take 1 tablet by mouth daily. 4200 mg    [provider]  cyclobenzaprine (FLEXERIL) 5 MG tablet Take 1 tablet (5 mg total) 3 (three)  times daily as needed by mouth for muscle spasms. Patient taking differently: Take 5 mg by mouth 3 (three) times daily.  02/02/17   Tanner, Lyndon Code., PA-C  eletriptan (RELPAX) 40 MG tablet Take 1 tablet (40 mg total) by mouth every 2 (two) hours as needed for migraine. 06/26/16   Dennie Bible, NP  HYDROcodone-acetaminophen (NORCO/VICODIN) 5-325 MG tablet Take 1 tablet by mouth every 6 (six) hours as needed for moderate pain.    [provider]  HYDROmorphone (DILAUDID) 4 MG tablet Take 1-2 tablets (4-8 mg total) every 4 (four) hours as needed by mouth for severe pain. 02/08/17   Ladell Pier, MD  hydrOXYzine (ATARAX/VISTARIL) 25 MG tablet Take 25 mg  by mouth every 8 (eight) hours as needed for itching.  09/18/16   [provider]  insulin detemir (LEVEMIR) 100 UNIT/ML injection Inject 0.1 mLs (10 Units total) at bedtime into the skin. 02/06/17   [name], MD  insulin lispro (HUMALOG) 100 UNIT/ML injection Inject 0-6 Units into the skin 3 (three) times daily before meals. Only takes when glucose is "very high"    [provider]  lidocaine-prilocaine (EMLA) cream Apply 1 application topically as needed. Apply to [facility] a Cath site one hour prior to needle stick. 08/29/16   Ladell Pier, MD  loratadine (CLARITIN) 10 MG tablet Take 10-40 mg by mouth daily as needed for allergies.     [provider]  LORazepam (ATIVAN) 0.5 MG tablet Take 1 tablet (0.5 mg total) by mouth every 8 (eight) hours as needed (for nausea). 02/19/17   Ladell Pier, MD  LYRICA 200 MG capsule TAKE ONE (1) CAPSULE BY MOUTH EVERY MORNING 12/18/16   Dennie Bible, NP  Multiple Vitamin (MULTIVITAMIN WITH MINERALS) TABS tablet Take 1 tablet by mouth daily.    [provider]  nitroGLYCERIN (NITROSTAT) 0.4 MG SL tablet Place 1 tablet (0.4 mg total) under the tongue every 5 (five) minutes as needed for chest pain. 06/28/15   Belva Crome, MD  ondansetron (ZOFRAN-ODT) 8 MG  disintegrating tablet Take 1 tablet (8 mg total) by mouth every 8 (eight) hours as needed for nausea or vomiting. 09/18/16   Maryanna Shape, NP  oxybutynin (DITROPAN-XL) 10 MG 24 hr tablet Take 1 tablet (10 mg total) by mouth every morning. 03/06/17   Owens Shark, NP  oxyCODONE (OXY IR/ROXICODONE) 5 MG immediate release tablet Take 1 tablet (5 mg total) by mouth every 4 (four) hours as needed for severe pain. 02/27/17   Owens Shark, NP  pantoprazole (PROTONIX) 40 MG tablet TAKE ONE (1) TABLET BY MOUTH TWO (2) TIMES DAILY 09/25/16   Ladene Artist, MD  polyethylene glycol [facility] Floria Raveling) packet Take 17 g by mouth daily as needed for mild constipation or moderate constipation. 02/24/17   Robbie Lis, MD  prochlorperazine (COMPAZINE) 10 MG tablet Take 10 mg by mouth every 6 (six) hours as needed for nausea or vomiting.    [provider]  ranitidine (ZANTAC) 300 MG tablet TAKE ONE TABLET BY MOUTH EVERY EVENING 03/07/17   Ladene Artist, MD  simethicone (MYLICON) 80 MG chewable tablet Chew 80-160 mg by mouth 2 (two) times daily as needed (stomach pain).    [provider]  vitamin B-12 (CYANOCOBALAMIN) 1000 MCG tablet Take 2,500 mcg by mouth every morning.    [provider]  XARELTO 20 MG TABS tablet Take 20 mg daily by mouth. 01/31/17   [provider]    Family History Family History  Problem Relation Age of Onset  . Hypertension Mother   . Stroke Mother   . Liver disease Mother        Abcess  . Hypertension Father   . Coronary artery disease Father   . Migraines Father   . Clotting disorder Father   . Kidney failure Brother   . Hypertension Brother   . Migraines Brother   . Migraines Daughter   . Breast cancer Other        Niece with breast cancer  . Colon cancer Paternal Grandmother   . Pancreatic cancer Paternal Grandmother   . Stomach cancer Paternal Grandmother   . Breast cancer  Cousin   . Esophageal cancer Neg Hx   . Rectal  cancer Neg Hx     Social History Social History   Tobacco Use  . Smoking status: Never Smoker  . Smokeless tobacco: Never Used  . Tobacco comment: Secondhand - from family growing up, workplace intermittently  Substance Use Topics  . Alcohol use: Yes    Alcohol/week: 0.0 oz    Comment: occasionally - intermittent, no more than twice a week  . Drug use: No     Allergies   Topamax [topiramate]; Aleve [naproxen sodium]; Bee venom; Echinacea; Other; Sulfa antibiotics; and Advil [ibuprofen]   Review of Systems Review of Systems  All other systems reviewed and are negative.    Physical Exam Updated Vital Signs BP (!) 93/54 (BP Location: Left Arm)   Pulse (!) 105   Temp 98.8 F (37.1 C) (Oral)   Resp (!) 22   Ht 5\' 3"  (1.6 m)   Wt 117.9 kg (260 lb)   SpO2 99%   BMI 46.06 kg/m   Physical Exam  Constitutional: She is oriented to person, place, and time. She appears well-developed.  HENT:  Head: Normocephalic and atraumatic.  Eyes: EOM are normal.  Neck: Normal range of motion. Neck supple.  Cardiovascular: Normal rate.  Pulmonary/Chest: Effort normal.  Abdominal: Bowel sounds are normal. There is tenderness in the suprapubic area. There is guarding.  Neurological: She is alert and oriented to person, place, and time.  Skin: Skin is warm and dry.  Nursing note and vitals reviewed.    ED Treatments / Results  Labs (all labs ordered are listed, but only abnormal results are displayed) Labs Reviewed  CBC WITH DIFFERENTIAL/PLATELET - Abnormal; Notable for the following components:      Result Value   RBC 3.77 (*)    Hemoglobin 10.3 (*)    HCT 31.8 (*)    Platelets 138 (*)    All other components within normal limits  URINALYSIS, ROUTINE W REFLEX MICROSCOPIC - Abnormal; Notable for the following components:   Ketones, ur 80 (*)    Protein, ur 100 (*)    Squamous Epithelial / LPF 0-5 (*)    All other components within normal limits  COMPREHENSIVE METABOLIC  PANEL - Abnormal; Notable for the following components:   Glucose, Bld 157 (*)    Albumin 2.7 (*)    Alkaline Phosphatase 172 (*)    All other components within normal limits  PROTIME-INR - Abnormal; Notable for the following components:   Prothrombin Time 15.8 (*)    All other components within normal limits  LIPASE, BLOOD  APTT  BASIC METABOLIC PANEL  CBC  I-STAT BETA HCG BLOOD, ED ([facility], [facility], [facility] ONLY)  I-STAT CHEM 8, ED  I-STAT CG4 LACTIC ACID, ED    EKG  EKG Interpretation None       Radiology Ct Angio Chest Pe W And/or Wo Contrast  Result Date: 03/11/2017 CLINICAL DATA:  61 year old female with pancreatic cancer undergoing treatment. Abdominal pain and nausea since yesterday. Chest pain and shortness of breath. EXAM: CT ANGIOGRAPHY CHEST CT ABDOMEN AND PELVIS WITH CONTRAST TECHNIQUE: Multidetector CT imaging of the chest was performed using the standard protocol during bolus administration of intravenous contrast. Multiplanar CT image reconstructions and MIPs were obtained to evaluate the vascular anatomy. Multidetector CT imaging of the abdomen and pelvis was performed using the standard protocol during bolus administration of intravenous contrast. CONTRAST:  153mL ISOVUE-370 IOPAMIDOL (ISOVUE-370) INJECTION 76% COMPARISON:  CT Abdomen and  Pelvis 01/01/2017.  Chest CT 12/06/2016. FINDINGS: CTA CHEST FINDINGS Cardiovascular: Good contrast bolus timing in the pulmonary arterial tree. Mild to moderate respiratory motion. No convincing pulmonary artery filling defect to suggest acute pulmonary embolus. No pericardial effusion. Negative thoracic aorta. Right chest IJ approach porta cath in place. Mediastinum/Nodes: No mediastinal lymphadenopathy. Lungs/Pleura: Chronic elevation of the right hemidiaphragm. Lower lung volumes compared to the September chest CT. Increased right upper lobe pulmonary nodule since September on series 14, image 41, now 8 mm (previously 4 mm). Small lower lobe  subpleural nodules appear more stable. There is dependent atelectasis. There is confluent right lower lobe atelectasis. No pleural effusion. Musculoskeletal: No acute or suspicious osseous lesion identified in the chest. Review of the MIP images confirms the above findings. CT ABDOMEN and PELVIS FINDINGS Hepatobiliary: Progressed liver metastatic disease since October. Numerous larger and more discrete hypodense, rim enhancing liver masses individually measuring up to 3.5-4 cm diameter. Surgically absent gallbladder. Pancreas: Heterogeneously enhancing body pancreatic mass appears larger than in October. Today this is 45 x 47 by 43 mm (AP by transverse by CC) versus 28 x 49 by about 31 mm previously. There is atrophy and ductal dilatation in the pancreatic tail. There is mild peripancreatic fat stranding. Small peripancreatic lymph nodes appear stable. No vascular occlusion about the mass. Spleen: Negative. Adrenals/Urinary Tract: Adrenal glands remain within normal limits. Bilateral renal enhancement and contrast excretion is within normal limits. Unremarkable urinary bladder. Stomach/Bowel: Mild retained stool in the distal colon. Redundant sigmoid tracking into the left mid abdomen. Mild redundancy of the transverse colon. No large bowel inflammation. Negative appendix. No dilated or inflamed small bowel. There is a 5 cm small bowel diverticulum suspected in the proximal jejunum (series 4, image 39) which was smaller and less apparent in October (on series 3, image 69 of that study). No surrounding inflammation. Prior ventral abdominal hernia repair with mesh. Decompressed stomach. 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 The duodenum appears stable and otherwise within normal limits. No abdominal free fluid. Vascular/Lymphatic: Major arterial structures in the abdomen and pelvis are patent. Portal venous system is patent. Outside of the peripancreatic region there is no abdominal or pelvic lymphadenopathy.  Reproductive: Surgically absent uterus.  Negative ovaries. Other: No pelvic free fluid. Musculoskeletal: No acute or suspicious osseous lesion identified. Review of the MIP images confirms the above findings. IMPRESSION: 1.  Negative for acute pulmonary embolus. 2. Progressed primary pancreatic tumor and liver metastases since 01/01/2017. 3. Increased solitary right upper lobe lung nodule since September suspicious for pulmonary oligometastatic disease. 4. Lower lung volumes with atelectasis.  No pleural effusion. 5. No bowel obstruction or other acute findings identified. Electronically Signed   By: Genevie Ann M.D.   On: 03/11/2017 14:08   Ct Abdomen Pelvis W Contrast  Result Date: 03/11/2017 CLINICAL DATA:  61 year old female with pancreatic cancer undergoing treatment. Abdominal pain and nausea since yesterday. Chest pain and shortness of breath. EXAM: CT ANGIOGRAPHY CHEST CT ABDOMEN AND PELVIS WITH CONTRAST TECHNIQUE: Multidetector CT imaging of the chest was performed using the standard protocol during bolus administration of intravenous contrast. Multiplanar CT image reconstructions and MIPs were obtained to evaluate the vascular anatomy. Multidetector CT imaging of the abdomen and pelvis was performed using the standard protocol during bolus administration of intravenous contrast. CONTRAST:  154mL ISOVUE-370 IOPAMIDOL (ISOVUE-370) INJECTION 76% COMPARISON:  CT Abdomen and Pelvis 01/01/2017.  Chest CT 12/06/2016. FINDINGS: CTA CHEST FINDINGS Cardiovascular: Good contrast bolus timing in the pulmonary arterial tree. Mild  to moderate respiratory motion. No convincing pulmonary artery filling defect to suggest acute pulmonary embolus. No pericardial effusion. Negative thoracic aorta. Right chest IJ approach porta cath in place. Mediastinum/Nodes: No mediastinal lymphadenopathy. Lungs/Pleura: Chronic elevation of the right hemidiaphragm. Lower lung volumes compared to the September chest CT. Increased right  upper lobe pulmonary nodule since September on series 14, image 41, now 8 mm (previously 4 mm). Small lower lobe subpleural nodules appear more stable. There is dependent atelectasis. There is confluent right lower lobe atelectasis. No pleural effusion. Musculoskeletal: No acute or suspicious osseous lesion identified in the chest. Review of the MIP images confirms the above findings. CT ABDOMEN and PELVIS FINDINGS Hepatobiliary: Progressed liver metastatic disease since October. Numerous larger and more discrete hypodense, rim enhancing liver masses individually measuring up to 3.5-4 cm diameter. Surgically absent gallbladder. Pancreas: Heterogeneously enhancing body pancreatic mass appears larger than in October. Today this is 45 x 47 by 43 mm (AP by transverse by CC) versus 28 x 49 by about 31 mm previously. There is atrophy and ductal dilatation in the pancreatic tail. There is mild peripancreatic fat stranding. Small peripancreatic lymph nodes appear stable. No vascular occlusion about the mass. Spleen: Negative. Adrenals/Urinary Tract: Adrenal glands remain within normal limits. Bilateral renal enhancement and contrast excretion is within normal limits. Unremarkable urinary bladder. Stomach/Bowel: Mild retained stool in the distal colon. Redundant sigmoid tracking into the left mid abdomen. Mild redundancy of the transverse colon. No large bowel inflammation. Negative appendix. No dilated or inflamed small bowel. There is a 5 cm small bowel diverticulum suspected in the proximal jejunum (series 4, image 39) which was smaller and less apparent in October (on series 3, image 69 of that study). No surrounding inflammation. Prior ventral abdominal hernia repair with mesh. Decompressed stomach. 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 The duodenum appears stable and otherwise within normal limits. No abdominal free fluid. Vascular/Lymphatic: Major arterial structures in the abdomen and pelvis are patent. Portal  venous system is patent. Outside of the peripancreatic region there is no abdominal or pelvic lymphadenopathy. Reproductive: Surgically absent uterus.  Negative ovaries. Other: No pelvic free fluid. Musculoskeletal: No acute or suspicious osseous lesion identified. Review of the MIP images confirms the above findings. IMPRESSION: 1.  Negative for acute pulmonary embolus. 2. Progressed primary pancreatic tumor and liver metastases since 01/01/2017. 3. Increased solitary right upper lobe lung nodule since September suspicious for pulmonary oligometastatic disease. 4. Lower lung volumes with atelectasis.  No pleural effusion. 5. No bowel obstruction or other acute findings identified. Electronically Signed   By: Genevie Ann M.D.   On: 03/11/2017 14:08    Procedures Procedures (including critical care time)  Medications Ordered in ED Medications  aspirin EC tablet 81 mg (not administered)  citalopram (CELEXA) tablet 20 mg (not administered)  cyclobenzaprine (FLEXERIL) tablet 5 mg (not administered)  hydrOXYzine (ATARAX/VISTARIL) tablet 25 mg (not administered)  insulin detemir (LEVEMIR) injection 10 Units (not administered)  LORazepam (ATIVAN) tablet 0.5 mg (not administered)  pregabalin (LYRICA) capsule 200 mg (not administered)  multivitamin with minerals tablet 1 tablet (not administered)  nitroGLYCERIN (NITROSTAT) SL tablet 0.4 mg (not administered)  oxybutynin (DITROPAN-XL) 24 hr tablet 10 mg (not administered)  pantoprazole (PROTONIX) EC tablet 40 mg (not administered)  polyethylene glycol (MIRALAX / GLYCOLAX) packet 17 g (not administered)  prochlorperazine (COMPAZINE) tablet 10 mg (not administered)  famotidine (PEPCID) tablet 20 mg (not administered)  simethicone (MYLICON) chewable tablet 80-160 mg (not administered)  cyanocobalamin 500 mcg, vitamin  B-12 (CYANOCOBALAMIN) 2,000 mcg (not administered)  rivaroxaban (XARELTO) tablet 20 mg (not administered)  acetaminophen (TYLENOL) tablet 650  mg (not administered)    Or  acetaminophen (TYLENOL) suppository 650 mg (not administered)  ondansetron (ZOFRAN) tablet 4 mg (not administered)    Or  ondansetron (ZOFRAN) injection 4 mg (not administered)  insulin aspart (novoLOG) injection 0-9 Units (not administered)  HYDROmorphone (DILAUDID) injection 1 mg (not administered)  HYDROmorphone (DILAUDID) injection 1 mg (1 mg Intravenous Given 03/12/17 2240)  ondansetron (ZOFRAN) injection 4 mg (4 mg Intravenous Given 03/12/17 2241)  lactated ringers bolus 1,000 mL (0 mLs Intravenous Stopped 03/13/17 0058)     Initial Impression / Assessment and Plan / ED Course  I have reviewed the triage vital signs and the nursing notes.  Pertinent labs & imaging results that were available during my care of the patient were reviewed by me and considered in my medical decision making (see chart for details).     Patient comes in with chief complaint of abdominal pain.  Patient has history of metastatic pancreatic cancer.  On exam patient is having generalized lower quadrant tenderness without any rebound.  Patient's pain today is different than the pain she had yesterday, however the CAT scan from yesterday was unremarkable.  I suspect that the pain is secondary to tumor.  Additionally, patient is having nausea chills and diaphoresis.  There is no fever in the ED, white count is normal -and I suspect that the constitutional is are related to cancer mediated processes.  Pain control initiated, patient does not feel comfortable going home.  We will admit.   Final Clinical Impressions(s) / ED Diagnoses   Final diagnoses:  Cancer related pain  Pancreatic cancer metastasized to liver [facility])    ED Discharge Orders    None       Varney Biles, MD 03/13/17 [phone]

## 2017-03-13 NOTE — Telephone Encounter (Signed)
Order sent via community message to [facility] today for a patient 2 week autotitration from 4-20 cm H2O.

## 2017-03-13 NOTE — Progress Notes (Signed)
Palliative Medicine consult noted. Due to high referral volume, there may be a delay seeing this patient. Please call the Palliative Medicine Team office at [phone] if recommendations are needed in the interim.  Thank you for inviting [place] to see this patient.  Marjie Skiff Altheria Shadoan, RN, BSN, [facility] 03/13/2017 8:33 AM Cell [phone] 8:00-4:00 Monday-Friday Office [phone]

## 2017-03-13 NOTE — Telephone Encounter (Signed)
-----   Message from Cleon Gustin, [place] sent at 03/09/2017 10:33 AM EST ----- Regarding: Sleep patient Per Dr. Radford Pax, patient needs 2 week autotitration from 4-20 cm H2O.  Thanks, [place]

## 2017-03-13 NOTE — ED Notes (Signed)
ED TO INPATIENT HANDOFF REPORT  Name/Age/Gender Heather Frederick 61 y.o. female  Code Status    Code Status Orders  (From admission, onward)        Start     Ordered   03/12/17 2344  Full code  Continuous     03/12/17 2344    Code Status History    Date Active Date Inactive Code Status Order ID Comments User Context   02/23/2017 15:23 02/24/2017 17:58 Full Code [id number]  Annita Brod, MD ED   02/05/2017 15:35 02/06/2017 16:23 Full Code [id number]  [name], MD Inpatient   06/30/2016 11:03 07/01/2016 13:30 Full Code [id number]  Jackolyn Confer, MD Inpatient   05/06/2016 23:18 05/07/2016 21:01 Full Code [id number]  Etta Quill, DO ED   06/11/2014 18:28 06/12/2014 13:25 Full Code [id number]  Jackolyn Confer, MD Inpatient   03/10/2014 14:31 03/10/2014 21:12 Full Code [id number]  Belva Crome, MD Inpatient   09/10/2012 11:38 09/12/2012 17:57 Full Code [id number]  Garald Balding, MD Inpatient      Home/SNF/Other Home  Chief Complaint abdominal pain (cancer pt)  Level of Care/Admitting Diagnosis ED Disposition    ED Disposition Condition [place] [facility] Area: [facility] [[id number]]  Level of Care: Med-Surg [16]  Diagnosis: Abdominal pain [793903]  Admitting Physician: Rise Patience [phone]  Attending Physician: Rise Patience [phone]  PT Class (Do Not Modify): Observation [104]  PT Acc Code (Do Not Modify): Observation [10022]       Medical History Past Medical History:  Diagnosis Date  . Allergy   . Anxiety   . Arthritis   . Benign essential HTN 09/23/2014  . Cancer ([place])   . Chronic kidney disease    uti  . Depression   . Dyslipidemia   . Elevated liver enzymes   . Family history of anesthesia complication    father has a severe hard time waking up  . Gallstones    a. Seen on CT 01/2014.  Marland Kitchen GERD (gastroesophageal reflux disease)   . Hard of hearing   . Hepatic steatosis   . History of frequent urinary tract  infections   . Hyperlipidemia   . Hypertension   . Lateral epicondylitis of right elbow   . Mental disorder   . Meralgia paresthetica of right side 10/02/2012   slight at 05/2014  . Migraine headache   . Obesity   . OSA (obstructive sleep apnea)    severe with AHI 37/hr now on CPAP at 18cm H2O  . Osteoarthritis   . Pneumonia    Feb 2018  . Raynaud disease    in feet per patient   . Sepsis ([place]) 02/23/2017  . Sleep apnea    wears c-pap  . Urinary tract infection     Allergies Allergies  Allergen Reactions  . Topamax [Topiramate] Other (See Comments)    :Stroke like symptoms  . Aleve [Naproxen Sodium] Hives    Has tolerated Voltaren topical as well as aspirin.  . Bee Venom Swelling  . Echinacea Hives  . Other Other (See Comments)    Feathers cause sinus congestion  . Sulfa Antibiotics Hives  . Advil [Ibuprofen] Hives    Has tolerated Voltaren topical as well as aspirin.    IV Location/Drains/Wounds Patient Lines/Drains/Airways Status   Active Line/Drains/Airways    Name:   Placement date:   Placement time:   Site:   Days:   Implanted Port 08/28/16 Right Chest  08/28/16    1058    Chest   197   Peripheral IV 03/12/17 Left Antecubital   03/12/17    -    Antecubital   1   Incision - 4 Ports   06/11/14    1146     1006   Incision - 3 Ports   06/30/16    0811     256          Labs/Imaging Results for orders placed or performed during the hospital encounter of 03/12/17 (from the past 48 hour(s))  Lipase, blood     Status: None   Collection Time: 03/12/17  9:29 PM  Result Value Ref Range   Lipase 19 11 - 51 U/L  CBC with Diff     Status: Abnormal   Collection Time: 03/12/17  9:29 PM  Result Value Ref Range   WBC 6.3 4.0 - 10.5 K/uL   RBC 3.77 (L) 3.87 - 5.11 MIL/uL   Hemoglobin 10.3 (L) 12.0 - 15.0 g/dL   HCT 31.8 (L) 36.0 - 46.0 %   MCV 84.4 78.0 - 100.0 fL   MCH 27.3 26.0 - 34.0 pg   MCHC 32.4 30.0 - 36.0 g/dL   RDW 15.1 11.5 - 15.5 %   Platelets 138 (L) 150  - 400 K/uL   Neutrophils Relative % 72 %   Neutro Abs 4.5 1.7 - 7.7 K/uL   Lymphocytes Relative 16 %   Lymphs Abs 1.0 0.7 - 4.0 K/uL   Monocytes Relative 11 %   Monocytes Absolute 0.7 0.1 - 1.0 K/uL   Eosinophils Relative 1 %   Eosinophils Absolute 0.1 0.0 - 0.7 K/uL   Basophils Relative 0 %   Basophils Absolute 0.0 0.0 - 0.1 K/uL  Comprehensive metabolic panel     Status: Abnormal   Collection Time: 03/12/17  9:29 PM  Result Value Ref Range   Sodium 135 135 - 145 mmol/L   Potassium 3.9 3.5 - 5.1 mmol/L   Chloride 101 101 - 111 mmol/L   CO2 22 22 - 32 mmol/L   Glucose, Bld 157 (H) 65 - 99 mg/dL   BUN 10 6 - 20 mg/dL   Creatinine, Ser 0.82 0.44 - 1.00 mg/dL   Calcium 9.1 8.9 - 10.3 mg/dL   Total Protein 6.8 6.5 - 8.1 g/dL   Albumin 2.7 (L) 3.5 - 5.0 g/dL   AST 21 15 - 41 U/L   ALT 21 14 - 54 U/L   Alkaline Phosphatase 172 (H) 38 - 126 U/L   Total Bilirubin 1.0 0.3 - 1.2 mg/dL   GFR calc non Af Amer >60 >60 mL/min   GFR calc Af Amer >60 >60 mL/min    Comment: (NOTE) The eGFR has been calculated using the CKD EPI equation. This calculation has not been validated in all clinical situations. eGFR's persistently <60 mL/min signify possible Chronic Kidney Disease.    Anion gap 12 5 - 15  Protime-INR     Status: Abnormal   Collection Time: 03/12/17  9:29 PM  Result Value Ref Range   Prothrombin Time 15.8 (H) 11.4 - 15.2 seconds   INR 1.28   APTT     Status: None   Collection Time: 03/12/17  9:29 PM  Result Value Ref Range   aPTT 25 24 - 36 seconds  I-Stat beta hCG blood, ED     Status: None   Collection Time: 03/12/17 10:40 PM  Result Value Ref Range   I-stat hCG,  quantitative <5.0 <5 mIU/mL   Comment 3            Comment:   GEST. AGE      CONC.  (mIU/mL)   <=1 WEEK        5 - 50     2 WEEKS       50 - 500     3 WEEKS       100 - 10,000     4 WEEKS     1,000 - 30,000        FEMALE AND NON-PREGNANT FEMALE:     LESS THAN 5 mIU/mL   I-Stat CG4 Lactic Acid, ED      Status: None   Collection Time: 03/12/17 10:42 PM  Result Value Ref Range   Lactic Acid, Venous 1.18 0.5 - 1.9 mmol/L   Ct Angio Chest Pe W And/or Wo Contrast  Result Date: 03/11/2017 CLINICAL DATA:  61 year old female with pancreatic cancer undergoing treatment. Abdominal pain and nausea since yesterday. Chest pain and shortness of breath. EXAM: CT ANGIOGRAPHY CHEST CT ABDOMEN AND PELVIS WITH CONTRAST TECHNIQUE: Multidetector CT imaging of the chest was performed using the standard protocol during bolus administration of intravenous contrast. Multiplanar CT image reconstructions and MIPs were obtained to evaluate the vascular anatomy. Multidetector CT imaging of the abdomen and pelvis was performed using the standard protocol during bolus administration of intravenous contrast. CONTRAST:  149m ISOVUE-370 IOPAMIDOL (ISOVUE-370) INJECTION 76% COMPARISON:  CT Abdomen and Pelvis 01/01/2017.  Chest CT 12/06/2016. FINDINGS: CTA CHEST FINDINGS Cardiovascular: Good contrast bolus timing in the pulmonary arterial tree. Mild to moderate respiratory motion. No convincing pulmonary artery filling defect to suggest acute pulmonary embolus. No pericardial effusion. Negative thoracic aorta. Right chest IJ approach porta cath in place. Mediastinum/Nodes: No mediastinal lymphadenopathy. Lungs/Pleura: Chronic elevation of the right hemidiaphragm. Lower lung volumes compared to the September chest CT. Increased right upper lobe pulmonary nodule since September on series 14, image 41, now 8 mm (previously 4 mm). Small lower lobe subpleural nodules appear more stable. There is dependent atelectasis. There is confluent right lower lobe atelectasis. No pleural effusion. Musculoskeletal: No acute or suspicious osseous lesion identified in the chest. Review of the MIP images confirms the above findings. CT ABDOMEN and PELVIS FINDINGS Hepatobiliary: Progressed liver metastatic disease since October. Numerous larger and more  discrete hypodense, rim enhancing liver masses individually measuring up to 3.5-4 cm diameter. Surgically absent gallbladder. Pancreas: Heterogeneously enhancing body pancreatic mass appears larger than in October. Today this is 45 x 47 by 43 mm (AP by transverse by CC) versus 28 x 49 by about 31 mm previously. There is atrophy and ductal dilatation in the pancreatic tail. There is mild peripancreatic fat stranding. Small peripancreatic lymph nodes appear stable. No vascular occlusion about the mass. Spleen: Negative. Adrenals/Urinary Tract: Adrenal glands remain within normal limits. Bilateral renal enhancement and contrast excretion is within normal limits. Unremarkable urinary bladder. Stomach/Bowel: Mild retained stool in the distal colon. Redundant sigmoid tracking into the left mid abdomen. Mild redundancy of the transverse colon. No large bowel inflammation. Negative appendix. No dilated or inflamed small bowel. There is a 5 cm small bowel diverticulum suspected in the proximal jejunum (series 4, image 39) which was smaller and less apparent in October (on series 3, image 69 of that study). No surrounding inflammation. Prior ventral abdominal hernia repair with mesh. Decompressed stomach. 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 The duodenum appears stable and otherwise within normal limits. No abdominal  free fluid. Vascular/Lymphatic: Major arterial structures in the abdomen and pelvis are patent. Portal venous system is patent. Outside of the peripancreatic region there is no abdominal or pelvic lymphadenopathy. Reproductive: Surgically absent uterus.  Negative ovaries. Other: No pelvic free fluid. Musculoskeletal: No acute or suspicious osseous lesion identified. Review of the MIP images confirms the above findings. IMPRESSION: 1.  Negative for acute pulmonary embolus. 2. Progressed primary pancreatic tumor and liver metastases since 01/01/2017. 3. Increased solitary right upper lobe lung nodule since  September suspicious for pulmonary oligometastatic disease. 4. Lower lung volumes with atelectasis.  No pleural effusion. 5. No bowel obstruction or other acute findings identified. Electronically Signed   By: Genevie Ann M.D.   On: 03/11/2017 14:08   Ct Abdomen Pelvis W Contrast  Result Date: 03/11/2017 CLINICAL DATA:  61 year old female with pancreatic cancer undergoing treatment. Abdominal pain and nausea since yesterday. Chest pain and shortness of breath. EXAM: CT ANGIOGRAPHY CHEST CT ABDOMEN AND PELVIS WITH CONTRAST TECHNIQUE: Multidetector CT imaging of the chest was performed using the standard protocol during bolus administration of intravenous contrast. Multiplanar CT image reconstructions and MIPs were obtained to evaluate the vascular anatomy. Multidetector CT imaging of the abdomen and pelvis was performed using the standard protocol during bolus administration of intravenous contrast. CONTRAST:  121m ISOVUE-370 IOPAMIDOL (ISOVUE-370) INJECTION 76% COMPARISON:  CT Abdomen and Pelvis 01/01/2017.  Chest CT 12/06/2016. FINDINGS: CTA CHEST FINDINGS Cardiovascular: Good contrast bolus timing in the pulmonary arterial tree. Mild to moderate respiratory motion. No convincing pulmonary artery filling defect to suggest acute pulmonary embolus. No pericardial effusion. Negative thoracic aorta. Right chest IJ approach porta cath in place. Mediastinum/Nodes: No mediastinal lymphadenopathy. Lungs/Pleura: Chronic elevation of the right hemidiaphragm. Lower lung volumes compared to the September chest CT. Increased right upper lobe pulmonary nodule since September on series 14, image 41, now 8 mm (previously 4 mm). Small lower lobe subpleural nodules appear more stable. There is dependent atelectasis. There is confluent right lower lobe atelectasis. No pleural effusion. Musculoskeletal: No acute or suspicious osseous lesion identified in the chest. Review of the MIP images confirms the above findings. CT ABDOMEN and  PELVIS FINDINGS Hepatobiliary: Progressed liver metastatic disease since October. Numerous larger and more discrete hypodense, rim enhancing liver masses individually measuring up to 3.5-4 cm diameter. Surgically absent gallbladder. Pancreas: Heterogeneously enhancing body pancreatic mass appears larger than in October. Today this is 45 x 47 by 43 mm (AP by transverse by CC) versus 28 x 49 by about 31 mm previously. There is atrophy and ductal dilatation in the pancreatic tail. There is mild peripancreatic fat stranding. Small peripancreatic lymph nodes appear stable. No vascular occlusion about the mass. Spleen: Negative. Adrenals/Urinary Tract: Adrenal glands remain within normal limits. Bilateral renal enhancement and contrast excretion is within normal limits. Unremarkable urinary bladder. Stomach/Bowel: Mild retained stool in the distal colon. Redundant sigmoid tracking into the left mid abdomen. Mild redundancy of the transverse colon. No large bowel inflammation. Negative appendix. No dilated or inflamed small bowel. There is a 5 cm small bowel diverticulum suspected in the proximal jejunum (series 4, image 39) which was smaller and less apparent in October (on series 3, image 69 of that study). No surrounding inflammation. Prior ventral abdominal hernia repair with mesh. Decompressed stomach. 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 The duodenum appears stable and otherwise within normal limits. No abdominal free fluid. Vascular/Lymphatic: Major arterial structures in the abdomen and pelvis are patent. Portal venous system is patent. Outside of the  peripancreatic region there is no abdominal or pelvic lymphadenopathy. Reproductive: Surgically absent uterus.  Negative ovaries. Other: No pelvic free fluid. Musculoskeletal: No acute or suspicious osseous lesion identified. Review of the MIP images confirms the above findings. IMPRESSION: 1.  Negative for acute pulmonary embolus. 2. Progressed primary  pancreatic tumor and liver metastases since 01/01/2017. 3. Increased solitary right upper lobe lung nodule since September suspicious for pulmonary oligometastatic disease. 4. Lower lung volumes with atelectasis.  No pleural effusion. 5. No bowel obstruction or other acute findings identified. Electronically Signed   By: Genevie Ann M.D.   On: 03/11/2017 14:08    Pending Labs Unresulted Labs (From admission, onward)   Start     Ordered   03/13/17 0092  Basic metabolic panel  Tomorrow morning,   R     03/12/17 2344   03/13/17 0500  CBC  Tomorrow morning,   R     03/12/17 2344   03/12/17 2129  Urinalysis, Routine w reflex microscopic  STAT,   STAT     03/12/17 2129      Vitals/Pain Today's Vitals   03/12/17 2200 03/12/17 2300 03/12/17 2328 03/13/17 0000  BP: 105/71 106/67  (!) 105/52  Pulse: 96 (!) 106  99  Resp: (!) 24 (!) 22  (!) 22  Temp:      TempSrc:      SpO2: 93% 91%  95%  PainSc:   0-No pain     Isolation Precautions No active isolations  Medications Medications  aspirin EC tablet 81 mg (not administered)  citalopram (CELEXA) tablet 20 mg (not administered)  cyclobenzaprine (FLEXERIL) tablet 5 mg (not administered)  hydrOXYzine (ATARAX/VISTARIL) tablet 25 mg (not administered)  insulin detemir (LEVEMIR) injection 10 Units (not administered)  LORazepam (ATIVAN) tablet 0.5 mg (not administered)  pregabalin (LYRICA) capsule 200 mg (not administered)  multivitamin with minerals tablet 1 tablet (not administered)  nitroGLYCERIN (NITROSTAT) SL tablet 0.4 mg (not administered)  oxybutynin (DITROPAN-XL) 24 hr tablet 10 mg (not administered)  pantoprazole (PROTONIX) EC tablet 40 mg (not administered)  polyethylene glycol (MIRALAX / GLYCOLAX) packet 17 g (not administered)  prochlorperazine (COMPAZINE) tablet 10 mg (not administered)  famotidine (PEPCID) tablet 20 mg (not administered)  simethicone (MYLICON) chewable tablet 80-160 mg (not administered)  cyanocobalamin 500 mcg,  vitamin B-12 (CYANOCOBALAMIN) 2,000 mcg (not administered)  rivaroxaban (XARELTO) tablet 20 mg (not administered)  acetaminophen (TYLENOL) tablet 650 mg (not administered)    Or  acetaminophen (TYLENOL) suppository 650 mg (not administered)  ondansetron (ZOFRAN) tablet 4 mg (not administered)    Or  ondansetron (ZOFRAN) injection 4 mg (not administered)  insulin aspart (novoLOG) injection 0-9 Units (not administered)  HYDROmorphone (DILAUDID) injection 1 mg (not administered)  HYDROmorphone (DILAUDID) injection 1 mg (1 mg Intravenous Given 03/12/17 2240)  ondansetron (ZOFRAN) injection 4 mg (4 mg Intravenous Given 03/12/17 2241)  lactated ringers bolus 1,000 mL (0 mLs Intravenous Stopped 03/13/17 0058)    Mobility walks

## 2017-03-13 NOTE — Progress Notes (Signed)
[facility] PROGRESS NOTE    Progress Note  Heather Frederick  TOI:[id number] DOB: [birth year] DOA: 03/12/2017 PCP: [name], MD     Brief Narrative:   Heather Frederick is an 61 y.o. female past medical history of pancreatic cancer diagnosed in May 2018 on chemotherapy came into the ED for 24 hours prior to admission and he did a CT Angie of this showed progression of his pancreatic cancer, was discharged home after pain was controlled but came back with uncontrollable pain mainly in the periumbilical area.  Assessment/Plan:   Abdominal pain, generalized likely due to pancreatic cancer: On IV Dilaudid we will start her on oral MS Contin twice a day. Dr. Malachy Mood on board appreciate assistance. Reevaluate pain tomorrow morning.  History of right cephalic vein thrombosis: Continue Xarelto.  Diabetes mellitus type 2: Continue long-acting insulin plus sliding scale.  Chronic Diastolic heart failure, stage B (HCC) With no antihypertensive medication or heart failure medications on her home list. At this point she appears to be euvolemic KVO IV fluids.  OSA (obstructive sleep apnea)  Benign essential HTN Continue to keep her off antihypertensive medication.  Chronic anticoagulation CONT XARELTO  DVT prophylaxis: Xarelto Family Communication:none Disposition Plan/Barrier to D/C: home once pain is control Code Status:     Code Status Orders  (From admission, onward)        Start     Ordered   03/12/17 2344  Full code  Continuous     03/12/17 2344    Code Status History    Date Active Date Inactive Code Status Order ID Comments User Context   02/23/2017 15:23 02/24/2017 17:58 Full Code [id number]  Annita Brod, MD ED   02/05/2017 15:35 02/06/2017 16:23 Full Code [id number]  [name], MD Inpatient   06/30/2016 11:03 07/01/2016 13:30 Full Code [id number]  Jackolyn Confer, MD Inpatient   05/06/2016 23:18 05/07/2016 21:01 Full Code [id number]  Etta Quill, DO ED   06/11/2014 18:28 06/12/2014 13:25 Full Code [id number]  Jackolyn Confer, MD Inpatient   03/10/2014 14:31 03/10/2014 21:12 Full Code [id number]  Belva Crome, MD Inpatient   09/10/2012 11:38 09/12/2012 17:57 Full Code [id number]  Garald Balding, MD Inpatient        IV Access:    Peripheral IV   Procedures and diagnostic studies:   Ct Angio Chest Pe W And/or Wo Contrast  Result Date: 03/11/2017 CLINICAL DATA:  61 year old female with pancreatic cancer undergoing treatment. Abdominal pain and nausea since yesterday. Chest pain and shortness of breath. EXAM: CT ANGIOGRAPHY CHEST CT ABDOMEN AND PELVIS WITH CONTRAST TECHNIQUE: Multidetector CT imaging of the chest was performed using the standard protocol during bolus administration of intravenous contrast. Multiplanar CT image reconstructions and MIPs were obtained to evaluate the vascular anatomy. Multidetector CT imaging of the abdomen and pelvis was performed using the standard protocol during bolus administration of intravenous contrast. CONTRAST:  169mL ISOVUE-370 IOPAMIDOL (ISOVUE-370) INJECTION 76% COMPARISON:  CT Abdomen and Pelvis 01/01/2017.  Chest CT 12/06/2016. FINDINGS: CTA CHEST FINDINGS Cardiovascular: Good contrast bolus timing in the pulmonary arterial tree. Mild to moderate respiratory motion. No convincing pulmonary artery filling defect to suggest acute pulmonary embolus. No pericardial effusion. Negative thoracic aorta. Right chest IJ approach porta cath in place. Mediastinum/Nodes: No mediastinal lymphadenopathy. Lungs/Pleura: Chronic elevation of the right hemidiaphragm. Lower lung volumes compared to the September chest CT. Increased right upper lobe pulmonary nodule since September on series 14, image 41, now 8 mm (previously 4 mm). Small  lower lobe subpleural nodules appear more stable. There is dependent atelectasis. There is confluent right lower lobe atelectasis. No pleural effusion. Musculoskeletal: No acute or suspicious  osseous lesion identified in the chest. Review of the MIP images confirms the above findings. CT ABDOMEN and PELVIS FINDINGS Hepatobiliary: Progressed liver metastatic disease since October. Numerous larger and more discrete hypodense, rim enhancing liver masses individually measuring up to 3.5-4 cm diameter. Surgically absent gallbladder. Pancreas: Heterogeneously enhancing body pancreatic mass appears larger than in October. Today this is 45 x 47 by 43 mm (AP by transverse by CC) versus 28 x 49 by about 31 mm previously. There is atrophy and ductal dilatation in the pancreatic tail. There is mild peripancreatic fat stranding. Small peripancreatic lymph nodes appear stable. No vascular occlusion about the mass. Spleen: Negative. Adrenals/Urinary Tract: Adrenal glands remain within normal limits. Bilateral renal enhancement and contrast excretion is within normal limits. Unremarkable urinary bladder. Stomach/Bowel: Mild retained stool in the distal colon. Redundant sigmoid tracking into the left mid abdomen. Mild redundancy of the transverse colon. No large bowel inflammation. Negative appendix. No dilated or inflamed small bowel. There is a 5 cm small bowel diverticulum suspected in the proximal jejunum (series 4, image 39) which was smaller and less apparent in October (on series 3, image 69 of that study). No surrounding inflammation. Prior ventral abdominal hernia repair with mesh. Decompressed stomach. 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 The duodenum appears stable and otherwise within normal limits. No abdominal free fluid. Vascular/Lymphatic: Major arterial structures in the abdomen and pelvis are patent. Portal venous system is patent. Outside of the peripancreatic region there is no abdominal or pelvic lymphadenopathy. Reproductive: Surgically absent uterus.  Negative ovaries. Other: No pelvic free fluid. Musculoskeletal: No acute or suspicious osseous lesion identified. Review of the MIP images  confirms the above findings. IMPRESSION: 1.  Negative for acute pulmonary embolus. 2. Progressed primary pancreatic tumor and liver metastases since 01/01/2017. 3. Increased solitary right upper lobe lung nodule since September suspicious for pulmonary oligometastatic disease. 4. Lower lung volumes with atelectasis.  No pleural effusion. 5. No bowel obstruction or other acute findings identified. Electronically Signed   By: Genevie Ann M.D.   On: 03/11/2017 14:08   Ct Abdomen Pelvis W Contrast  Result Date: 03/11/2017 CLINICAL DATA:  61 year old female with pancreatic cancer undergoing treatment. Abdominal pain and nausea since yesterday. Chest pain and shortness of breath. EXAM: CT ANGIOGRAPHY CHEST CT ABDOMEN AND PELVIS WITH CONTRAST TECHNIQUE: Multidetector CT imaging of the chest was performed using the standard protocol during bolus administration of intravenous contrast. Multiplanar CT image reconstructions and MIPs were obtained to evaluate the vascular anatomy. Multidetector CT imaging of the abdomen and pelvis was performed using the standard protocol during bolus administration of intravenous contrast. CONTRAST:  11mL ISOVUE-370 IOPAMIDOL (ISOVUE-370) INJECTION 76% COMPARISON:  CT Abdomen and Pelvis 01/01/2017.  Chest CT 12/06/2016. FINDINGS: CTA CHEST FINDINGS Cardiovascular: Good contrast bolus timing in the pulmonary arterial tree. Mild to moderate respiratory motion. No convincing pulmonary artery filling defect to suggest acute pulmonary embolus. No pericardial effusion. Negative thoracic aorta. Right chest IJ approach porta cath in place. Mediastinum/Nodes: No mediastinal lymphadenopathy. Lungs/Pleura: Chronic elevation of the right hemidiaphragm. Lower lung volumes compared to the September chest CT. Increased right upper lobe pulmonary nodule since September on series 14, image 41, now 8 mm (previously 4 mm). Small lower lobe subpleural nodules appear more stable. There is dependent atelectasis.  There is confluent right lower lobe atelectasis. No pleural  effusion. Musculoskeletal: No acute or suspicious osseous lesion identified in the chest. Review of the MIP images confirms the above findings. CT ABDOMEN and PELVIS FINDINGS Hepatobiliary: Progressed liver metastatic disease since October. Numerous larger and more discrete hypodense, rim enhancing liver masses individually measuring up to 3.5-4 cm diameter. Surgically absent gallbladder. Pancreas: Heterogeneously enhancing body pancreatic mass appears larger than in October. Today this is 45 x 47 by 43 mm (AP by transverse by CC) versus 28 x 49 by about 31 mm previously. There is atrophy and ductal dilatation in the pancreatic tail. There is mild peripancreatic fat stranding. Small peripancreatic lymph nodes appear stable. No vascular occlusion about the mass. Spleen: Negative. Adrenals/Urinary Tract: Adrenal glands remain within normal limits. Bilateral renal enhancement and contrast excretion is within normal limits. Unremarkable urinary bladder. Stomach/Bowel: Mild retained stool in the distal colon. Redundant sigmoid tracking into the left mid abdomen. Mild redundancy of the transverse colon. No large bowel inflammation. Negative appendix. No dilated or inflamed small bowel. There is a 5 cm small bowel diverticulum suspected in the proximal jejunum (series 4, image 39) which was smaller and less apparent in October (on series 3, image 69 of that study). No surrounding inflammation. Prior ventral abdominal hernia repair with mesh. Decompressed stomach. 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 The duodenum appears stable and otherwise within normal limits. No abdominal free fluid. Vascular/Lymphatic: Major arterial structures in the abdomen and pelvis are patent. Portal venous system is patent. Outside of the peripancreatic region there is no abdominal or pelvic lymphadenopathy. Reproductive: Surgically absent uterus.  Negative ovaries. Other: No pelvic  free fluid. Musculoskeletal: No acute or suspicious osseous lesion identified. Review of the MIP images confirms the above findings. IMPRESSION: 1.  Negative for acute pulmonary embolus. 2. Progressed primary pancreatic tumor and liver metastases since 01/01/2017. 3. Increased solitary right upper lobe lung nodule since September suspicious for pulmonary oligometastatic disease. 4. Lower lung volumes with atelectasis.  No pleural effusion. 5. No bowel obstruction or other acute findings identified. Electronically Signed   By: Genevie Ann M.D.   On: 03/11/2017 14:08     Medical Consultants:    None.  Anti-Infectives:   None  Subjective:    Heather Frederick she relates her pain is not controlled.  Objective:    Vitals:   03/12/17 2300 03/13/17 0000 03/13/17 0145 03/13/17 0500  BP: 106/67 (!) 105/52 (!) 93/54 92/69  Pulse: (!) 106 99 (!) 105 92  Resp: (!) 22 (!) 22 (!) 22 20  Temp:   98.8 F (37.1 C)   TempSrc:   Oral Oral  SpO2: 91% 95% 99% 97%  Weight:   117.9 kg (260 lb)   Height:   5\' 3"  (1.6 m)     Intake/Output Summary (Last 24 hours) at 03/13/2017 0832 Last data filed at 03/13/2017 0501 Gross per 24 hour  Intake 1060 ml  Output -  Net 1060 ml   Filed Weights   03/13/17 0145  Weight: 117.9 kg (260 lb)    Exam: General exam: In no acute distress, morbidly obese. Respiratory system: Good air movement and clear to auscultation. Cardiovascular system: S1 & S2 heard, RRR.   Gastrointestinal system: Positive bowel sounds soft nontender nondistended. Central nervous system: Alert and oriented. No focal neurological deficits. Extremities: No pedal edema. Skin: No rashes, lesions or ulcers Psychiatry: Judgement and insight appear normal. Mood & affect appropriate.    Data Reviewed:    Labs: Basic Metabolic Panel: Recent Labs  Lab  03/06/17 1105  03/11/17 1207 03/12/17 2129 03/13/17 0459  NA 135*  --  130* 135 135  K 4.2   < > 3.7 3.9 3.8  CL  --   --  95* 101  103  CO2 23  --  25 22 25   GLUCOSE 238*  --  164* 157* 135*  BUN 7.9  --  14 10 9   CREATININE 0.9  --  0.65 0.82 0.64  CALCIUM 9.2  --  8.7* 9.1 8.8*   < > = values in this interval not displayed.   GFR Estimated Creatinine Clearance: 94 mL/min (by C-G formula based on SCr of 0.64 mg/dL). Liver Function Tests: Recent Labs  Lab 03/06/17 1105 03/11/17 1207 03/12/17 2129  AST 26 21 21   ALT 21 24 21   ALKPHOS 155* 155* 172*  BILITOT 0.44 1.1 1.0  PROT 6.8 6.8 6.8  ALBUMIN 2.7* 2.6* 2.7*   Recent Labs  Lab 03/11/17 1207 03/12/17 2129  LIPASE 19 19   No results for input(s): AMMONIA in the last 168 hours. Coagulation profile Recent Labs  Lab 03/12/17 2129  INR 1.28    CBC: Recent Labs  Lab 03/06/17 1105 03/11/17 1207 03/12/17 2129 03/13/17 0459  WBC 4.2 4.5 6.3 5.1  NEUTROABS 2.2  --  4.5  --   HGB 10.1* 10.6* 10.3* 9.6*  HCT 32.1* 32.8* 31.8* 29.4*  MCV 86.8 85.9 84.4 85.2  PLT 203 146* 138* 113*   Cardiac Enzymes: No results for input(s): CKTOTAL, CKMB, CKMBINDEX, TROPONINI in the last 168 hours. BNP (last 3 results) No results for input(s): PROBNP in the last 8760 hours. CBG: Recent Labs  Lab 03/13/17 0224 03/13/17 0739  GLUCAP 161* 106*   D-Dimer: No results for input(s): DDIMER in the last 72 hours. Hgb A1c: No results for input(s): HGBA1C in the last 72 hours. Lipid Profile: No results for input(s): CHOL, HDL, LDLCALC, TRIG, CHOLHDL, LDLDIRECT in the last 72 hours. Thyroid function studies: No results for input(s): TSH, T4TOTAL, T3FREE, THYROIDAB in the last 72 hours.  Invalid input(s): FREET3 Anemia work up: No results for input(s): VITAMINB12, FOLATE, FERRITIN, TIBC, IRON, RETICCTPCT in the last 72 hours. Sepsis Labs: Recent Labs  Lab 03/06/17 1105 03/11/17 1207 03/12/17 2129 03/12/17 2242 03/13/17 0459  WBC 4.2 4.5 6.3  --  5.1  LATICACIDVEN  --   --   --  1.18  --    Microbiology No results found for this or any previous visit  (from the past 240 hour(s)).   Medications:   . aspirin EC  81 mg Oral Daily  . citalopram  20 mg Oral Daily  . vitamin B-12  2,500 mcg Oral Daily  . cyclobenzaprine  5 mg Oral TID  . famotidine  20 mg Oral BID  . insulin aspart  0-9 Units Subcutaneous TID WC  . insulin detemir  10 Units Subcutaneous QHS  . multivitamin with minerals  1 tablet Oral Daily  . oxybutynin  10 mg Oral q morning - 10a  . pantoprazole  40 mg Oral Daily  . pregabalin  200 mg Oral Daily  . rivaroxaban  20 mg Oral Daily   Continuous Infusions:   LOS: 0 days   Charlynne Cousins  [facility] Pager [phone]  *Please refer to [email or web address], password [email or web address] to get updated schedule on who will round on this patient, as hospitalists switch teams weekly. If 7PM-7AM, please contact night-coverage at [email or web address], password [email or web address] for any overnight needs.  03/13/2017, 8:32 AM

## 2017-03-13 NOTE — Progress Notes (Signed)
IP PROGRESS NOTE  Subjective:   Heather Frederick was treated with FOLFIRINOX 03/06/2017.  She was seen in the emergency room 03/11/2017 with increased pain.  She was discharged home.  She returned to the emergency room 03/12/2017 with progressive pain and was admitted for further evaluation. She reports pain, chiefly at the right upper abdomen that is unrelieved with the home narcotic regimen.  She has nausea.  No diarrhea.  She felt "cold "following the most recent cycle of chemotherapy.  A CT on 03/11/2017 revealed an increase in the size of the primary pancreas tumor and liver metastases compared to a CT 01/01/2017.    Objective: Vital signs in last 24 hours: Blood pressure 113/70, pulse 89, temperature 98 F (36.7 C), temperature source Oral, resp. rate 16, height '5\' 3"'  (1.6 m), weight 260 lb (117.9 kg), SpO2 96 %.  Intake/Output from previous day: 12/17 0701 - 12/18 0700 In: 1060 [P.O.:60; IV Piggyback:1000] Out: -   Physical Exam:  HEENT: Thrush Lungs: Clear bilaterally Cardiac: Regular rate and rhythm Abdomen: Tender in the right upper abdomen, no mass, no hepatomegaly Extremities: No leg edema   Portacath/PICC-without erythema  Lab Results: Recent Labs    03/12/17 2129 03/13/17 0459  WBC 6.3 5.1  HGB 10.3* 9.6*  HCT 31.8* 29.4*  PLT 138* 113*    BMET Recent Labs    03/12/17 2129 03/13/17 0459  NA 135 135  K 3.9 3.8  CL 101 103  CO2 22 25  GLUCOSE 157* 135*  BUN 10 9  CREATININE 0.82 0.64  CALCIUM 9.1 8.8*     Medications: I have reviewed the patient's current medications.  Assessment/Plan: 1. Metastatic pancreas cancer ? Pancreas body mass and liver metastases noted on CT abdomen/pelvis 08/11/2016 ? Ultrasound-guided biopsy of a right liver lesion 08/16/2016 revealed poorly differentiated adenocarcinoma consistent with pancreas cancer ? Foundation 1-microsatellite stable; tumor mutational burden 1; ERBB2 amplification ? Cycle 1 gemcitabine/Abraxane  09/13/2016; 09/29/2016 ? Cycle 2 gemcitabine/Abraxane 10/11/2016, 10/18/2016 ? Cycle 3 gemcitabine/Abraxane 11/01/2016, 11/08/2016 ? Cycle 4 gemcitabine/Abraxane 11/23/2016,11/29/2016 ? CT chest 12/06/2016-liver lesions appear smaller ? Cycle 5 gemcitabine/Abraxane 12/13/2016 ? CT abdomen/pelvis 01/01/2017-new and enlarging hepatic masses. Enlarging pancreatic mass. ? Cycle 6 gemcitabine/Abraxane 01/03/2017 ? Rising CA 19-9, increased pain October 2018 ? Cycle 1 FOLFOX 01/31/2017 ? Cycle 2 FOLFOX 02/19/2017 ? Cycle 3 FOLFIRINOX 03/06/2017 ? CT 03/11/2017 (compared to 01/01/2017) increased liver lesions and enlargement of the pancreas mass  2. Pain secondary to pancreas cancer  3. Hypertension  4. Sleep apnea  5. Chronic low back pain  6. Recurrent urinary tract infections  7. Depression  8. Migraine headaches  9. Port-A-Cath placement 08/28/2016  10. Rash following cycle 1 gemcitabine/Abraxane-drug rash?  11. Nausea/vomiting following cycle 1 gemcitabine/Abraxane-antiemetic regimen adjusted with addition of Aloxi  12. Diabetes  13. Right cephalic vein thrombosis [id number]/46/5681-[id number] with Xarelto  14. CT chest 12/06/2016 done to evaluate dyspnea-several 3-4 mm nodular opacities in the lung parenchyma etiology uncertain. Known mass in the body of the pancreas measuring 3.3 x 2.4 cm;small enhancing lesion in the anterior dome of the liver measures 8 mm.  15. Right forearm rash following cycle 5 day 8 gemcitabine/Abraxane. Resolved   Ms. Weinfeld has metastatic pancreas cancer she is now at day 8 following a cycle of FOLFIRINOX.  This was the first cycle she received irinotecan.  Her performance status is declining.  She has increased pain.  I suspect her symptoms are secondary to progression of the pancreas cancer.  She understands the  CT 03/11/2017 revealed enlargement of the pancreas mass and liver metastases.  It is too early to judge the effect  of irinotecan, but the chance of a response to chemotherapy is small.  Pain is the chief issue at present.  We will work on pain control over the next few days.  We will have discussions regarding continuation of chemotherapy versus Hospice care pending her status over the next few weeks.  Recommendations: 1.  Begin MS Contin 2.  IV/oral Dilaudid for breakthrough pain 3.  I will continue following her daily with the medical service.  Outpatient follow-up will be scheduled at the Cancer center.   LOS: 0 days   Betsy Coder, MD   03/13/2017, 2:08 PM

## 2017-03-14 ENCOUNTER — Other Ambulatory Visit: Payer: 59

## 2017-03-14 ENCOUNTER — Ambulatory Visit: Payer: 59

## 2017-03-14 DIAGNOSIS — Z87440 Personal history of urinary (tract) infections: Secondary | ICD-10-CM

## 2017-03-14 DIAGNOSIS — C259 Malignant neoplasm of pancreas, unspecified: Secondary | ICD-10-CM

## 2017-03-14 LAB — GLUCOSE, CAPILLARY
GLUCOSE-CAPILLARY: 103 mg/dL — AB (ref 65–99)
Glucose-Capillary: 168 mg/dL — ABNORMAL HIGH (ref 65–99)
Glucose-Capillary: 108 mg/dL — ABNORMAL HIGH (ref 65–99)
Glucose-Capillary: 166 mg/dL — ABNORMAL HIGH (ref 65–99)

## 2017-03-14 MED ORDER — SODIUM CHLORIDE 0.9 % IV BOLUS (SEPSIS)
1000.0000 mL | Freq: Once | INTRAVENOUS | Status: AC
  Administered 2017-03-14: 1000 mL via INTRAVENOUS

## 2017-03-14 MED ORDER — HYDROMORPHONE HCL 4 MG PO TABS: 2.0000 mg | ORAL_TABLET | ORAL | 0 refills | Status: DC | PRN

## 2017-03-14 MED ORDER — MORPHINE SULFATE ER 30 MG PO TBCR: 30.0000 mg | EXTENDED_RELEASE_TABLET | Freq: Two times a day (BID) | ORAL | 0 refills | Status: DC

## 2017-03-14 MED ORDER — SODIUM CHLORIDE 0.9 % IV SOLN
INTRAVENOUS | Status: DC
  Administered 2017-03-14 – 2017-03-15 (×2): via INTRAVENOUS

## 2017-03-14 NOTE — Progress Notes (Signed)
   03/14/17 1600  Clinical Encounter Type  Visited With Patient;Health care provider  Visit Type Initial  Spiritual Encounters  Spiritual Needs Prayer  Stress Factors  Patient Stress Factors (acknowledged some tough decisions coming up)   Rounding on patients on the Palliative list.  A friend was present but leaving.  We chatted for a bit about life and her husband and their dogs.  She said she would probably go home tomorrow and is looking forward to that.  Did not say much about her medical condition, but seemed very at ease and pleasant to talk to.  She has a strong faith and a good church community of support.  Will follow as needed. Chaplain Katherene Ponto

## 2017-03-14 NOTE — Discharge Summary (Signed)
Physician Discharge Summary  Heather Frederick JQB:[id number] DOB: 05/12/1955 DOA: 03/12/2017  PCP: [name], MD  Admit date: 03/12/2017 Discharge date: 03/14/2017  Admitted From: home Disposition:  Home  Recommendations for Outpatient Follow-up:  1. Follow up with Oncologistin 1-2 weeks 2. To be refred to Hospice as an outpatient 3.   Home Health:no Equipment/Devices:none  Discharge Condition:stable CODE STATUS:full Diet recommendation: Heart Healthy   Brief/Interim Summary: 61 y.o. female past medical history of pancreatic cancer diagnosed in May 2018 on chemotherapy came into the ED for 24 hours prior to admission and he did a CT Angie of this showed progression of his pancreatic cancer, was discharged home after pain was controlled but came back with uncontrollable pain mainly in the periumbilical area.    Discharge Diagnoses:  Principal Problem:   Abdominal pain, generalized Active Problems:   Diastolic heart failure, stage B (HCC)   OSA (obstructive sleep apnea)   Benign essential HTN   Chronic anticoagulation   Pain   Abdominal pain  Generalized abdominal pain likely due to pancreatic cancer: She was started on several doses of IV Dilaudid with improvement in her pain. I discussed with Dr. Malachy Mood to change her to a long-acting narcotic like MS Contin with as needed low-dose narcotic as needed.  By the next day she was much improved she will go home on current regimen. All other narcotics were stopped and she would only go home on MS Contin plus low-dose oral Dilaudid as needed.  History of cephalic vein thrombosis: No changes were made.  Diabetes mellitus type 2: Continue long-acting plus sliding scale.  Chronic diastolic heart failure No changes made to her medication.  Obstructive sleep apnea:  Benign essential hypertension: Blood pressure is controlled without any antihypertensive medication.   Discharge Instructions  Discharge Instructions    Diet  - low sodium heart healthy   Complete by:  As directed    Increase activity slowly   Complete by:  As directed      Allergies as of 03/14/2017      Reactions   Topamax [topiramate] Other (See Comments)   :Stroke like symptoms   Aleve [naproxen Sodium] Hives   Has tolerated Voltaren topical as well as aspirin.   Bee Venom Swelling   Echinacea Hives   Other Other (See Comments)   Feathers cause sinus congestion   Sulfa Antibiotics Hives   Advil [ibuprofen] Hives   Has tolerated Voltaren topical as well as aspirin.      Medication List    STOP taking these medications   HYDROcodone-acetaminophen 5-325 MG tablet Commonly known as:  NORCO/VICODIN   hydrOXYzine 25 MG tablet Commonly known as:  ATARAX/VISTARIL   LORazepam 0.5 MG tablet Commonly known as:  ATIVAN   oxyCODONE 5 MG immediate release tablet Commonly known as:  Oxy IR/ROXICODONE   ranitidine 300 MG tablet Commonly known as:  ZANTAC     TAKE these medications   acetaminophen 650 MG CR tablet Commonly known as:  TYLENOL Take 1,300 mg by mouth every 8 (eight) hours as needed for pain.   aspirin EC 81 MG tablet Take 81 mg by mouth daily.   citalopram 20 MG tablet Commonly known as:  CELEXA TAKE ONE (1) TABLET BY MOUTH EVERY DAY   CRANBERRY PLUS VITAMIN C PO Take 1 tablet by mouth daily. 4200 mg   cyclobenzaprine 5 MG tablet Commonly known as:  FLEXERIL Take 1 tablet (5 mg total) 3 (three) times daily as needed by mouth for  muscle spasms. What changed:  when to take this   eletriptan 40 MG tablet Commonly known as:  RELPAX Take 1 tablet (40 mg total) by mouth every 2 (two) hours as needed for migraine.   HYDROmorphone 4 MG tablet Commonly known as:  DILAUDID Take 0.5 tablets (2 mg total) by mouth every 4 (four) hours as needed for severe pain. What changed:  how much to take   insulin detemir 100 UNIT/ML injection Commonly known as:  LEVEMIR Inject 0.1 mLs (10 Units total) at bedtime into the  skin.   insulin lispro 100 UNIT/ML injection Commonly known as:  HUMALOG Inject 0-6 Units into the skin 3 (three) times daily before meals. Only takes when glucose is "very high"   lidocaine-prilocaine cream Commonly known as:  EMLA Apply 1 application topically as needed. Apply to [facility] a Cath site one hour prior to needle stick.   loratadine 10 MG tablet Commonly known as:  CLARITIN Take 10-40 mg by mouth daily as needed for allergies.   LYRICA 200 MG capsule Generic drug:  pregabalin TAKE ONE (1) CAPSULE BY MOUTH EVERY MORNING   morphine 30 MG 12 hr tablet Commonly known as:  MS CONTIN Take 1 tablet (30 mg total) by mouth every 12 (twelve) hours.   multivitamin with minerals Tabs tablet Take 1 tablet by mouth daily.   nitroGLYCERIN 0.4 MG SL tablet Commonly known as:  NITROSTAT Place 1 tablet (0.4 mg total) under the tongue every 5 (five) minutes as needed for chest pain.   ondansetron 8 MG disintegrating tablet Commonly known as:  ZOFRAN-ODT Take 1 tablet (8 mg total) by mouth every 8 (eight) hours as needed for nausea or vomiting.   oxybutynin 10 MG 24 hr tablet Commonly known as:  DITROPAN-XL Take 1 tablet (10 mg total) by mouth every morning.   pantoprazole 40 MG tablet Commonly known as:  PROTONIX TAKE ONE (1) TABLET BY MOUTH TWO (2) TIMES DAILY   polyethylene glycol packet Commonly known as:  MIRALAX / GLYCOLAX Take 17 g by mouth daily as needed for mild constipation or moderate constipation.   prochlorperazine 10 MG tablet Commonly known as:  COMPAZINE Take 10 mg by mouth every 6 (six) hours as needed for nausea or vomiting.   simethicone 80 MG chewable tablet Commonly known as:  MYLICON Chew 92-119 mg by mouth 2 (two) times daily as needed (stomach pain).   vitamin B-12 1000 MCG tablet Commonly known as:  CYANOCOBALAMIN Take 2,500 mcg by mouth every morning.   XARELTO 20 MG Tabs tablet Generic drug:  rivaroxaban Take 20 mg daily by mouth.        Allergies  Allergen Reactions  . Topamax [Topiramate] Other (See Comments)    :Stroke like symptoms  . Aleve [Naproxen Sodium] Hives    Has tolerated Voltaren topical as well as aspirin.  . Bee Venom Swelling  . Echinacea Hives  . Other Other (See Comments)    Feathers cause sinus congestion  . Sulfa Antibiotics Hives  . Advil [Ibuprofen] Hives    Has tolerated Voltaren topical as well as aspirin.    Consultations:  Oncology   Procedures/Studies: Ct Angio Chest Pe W And/or Wo Contrast  Result Date: 03/11/2017 CLINICAL DATA:  61 year old female with pancreatic cancer undergoing treatment. Abdominal pain and nausea since yesterday. Chest pain and shortness of breath. EXAM: CT ANGIOGRAPHY CHEST CT ABDOMEN AND PELVIS WITH CONTRAST TECHNIQUE: Multidetector CT imaging of the chest was performed using the standard protocol during bolus  administration of intravenous contrast. Multiplanar CT image reconstructions and MIPs were obtained to evaluate the vascular anatomy. Multidetector CT imaging of the abdomen and pelvis was performed using the standard protocol during bolus administration of intravenous contrast. CONTRAST:  133mL ISOVUE-370 IOPAMIDOL (ISOVUE-370) INJECTION 76% COMPARISON:  CT Abdomen and Pelvis 01/01/2017.  Chest CT 12/06/2016. FINDINGS: CTA CHEST FINDINGS Cardiovascular: Good contrast bolus timing in the pulmonary arterial tree. Mild to moderate respiratory motion. No convincing pulmonary artery filling defect to suggest acute pulmonary embolus. No pericardial effusion. Negative thoracic aorta. Right chest IJ approach porta cath in place. Mediastinum/Nodes: No mediastinal lymphadenopathy. Lungs/Pleura: Chronic elevation of the right hemidiaphragm. Lower lung volumes compared to the September chest CT. Increased right upper lobe pulmonary nodule since September on series 14, image 41, now 8 mm (previously 4 mm). Small lower lobe subpleural nodules appear more stable. There is  dependent atelectasis. There is confluent right lower lobe atelectasis. No pleural effusion. Musculoskeletal: No acute or suspicious osseous lesion identified in the chest. Review of the MIP images confirms the above findings. CT ABDOMEN and PELVIS FINDINGS Hepatobiliary: Progressed liver metastatic disease since October. Numerous larger and more discrete hypodense, rim enhancing liver masses individually measuring up to 3.5-4 cm diameter. Surgically absent gallbladder. Pancreas: Heterogeneously enhancing body pancreatic mass appears larger than in October. Today this is 45 x 47 by 43 mm (AP by transverse by CC) versus 28 x 49 by about 31 mm previously. There is atrophy and ductal dilatation in the pancreatic tail. There is mild peripancreatic fat stranding. Small peripancreatic lymph nodes appear stable. No vascular occlusion about the mass. Spleen: Negative. Adrenals/Urinary Tract: Adrenal glands remain within normal limits. Bilateral renal enhancement and contrast excretion is within normal limits. Unremarkable urinary bladder. Stomach/Bowel: Mild retained stool in the distal colon. Redundant sigmoid tracking into the left mid abdomen. Mild redundancy of the transverse colon. No large bowel inflammation. Negative appendix. No dilated or inflamed small bowel. There is a 5 cm small bowel diverticulum suspected in the proximal jejunum (series 4, image 39) which was smaller and less apparent in October (on series 3, image 69 of that study). No surrounding inflammation. Prior ventral abdominal hernia repair with mesh. Decompressed stomach. 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 The duodenum appears stable and otherwise within normal limits. No abdominal free fluid. Vascular/Lymphatic: Major arterial structures in the abdomen and pelvis are patent. Portal venous system is patent. Outside of the peripancreatic region there is no abdominal or pelvic lymphadenopathy. Reproductive: Surgically absent uterus.  Negative  ovaries. Other: No pelvic free fluid. Musculoskeletal: No acute or suspicious osseous lesion identified. Review of the MIP images confirms the above findings. IMPRESSION: 1.  Negative for acute pulmonary embolus. 2. Progressed primary pancreatic tumor and liver metastases since 01/01/2017. 3. Increased solitary right upper lobe lung nodule since September suspicious for pulmonary oligometastatic disease. 4. Lower lung volumes with atelectasis.  No pleural effusion. 5. No bowel obstruction or other acute findings identified. Electronically Signed   By: Genevie Ann M.D.   On: 03/11/2017 14:08   Ct Abdomen Pelvis W Contrast  Result Date: 03/11/2017 CLINICAL DATA:  61 year old female with pancreatic cancer undergoing treatment. Abdominal pain and nausea since yesterday. Chest pain and shortness of breath. EXAM: CT ANGIOGRAPHY CHEST CT ABDOMEN AND PELVIS WITH CONTRAST TECHNIQUE: Multidetector CT imaging of the chest was performed using the standard protocol during bolus administration of intravenous contrast. Multiplanar CT image reconstructions and MIPs were obtained to evaluate the vascular anatomy. Multidetector CT imaging of  the abdomen and pelvis was performed using the standard protocol during bolus administration of intravenous contrast. CONTRAST:  181mL ISOVUE-370 IOPAMIDOL (ISOVUE-370) INJECTION 76% COMPARISON:  CT Abdomen and Pelvis 01/01/2017.  Chest CT 12/06/2016. FINDINGS: CTA CHEST FINDINGS Cardiovascular: Good contrast bolus timing in the pulmonary arterial tree. Mild to moderate respiratory motion. No convincing pulmonary artery filling defect to suggest acute pulmonary embolus. No pericardial effusion. Negative thoracic aorta. Right chest IJ approach porta cath in place. Mediastinum/Nodes: No mediastinal lymphadenopathy. Lungs/Pleura: Chronic elevation of the right hemidiaphragm. Lower lung volumes compared to the September chest CT. Increased right upper lobe pulmonary nodule since September on series  14, image 41, now 8 mm (previously 4 mm). Small lower lobe subpleural nodules appear more stable. There is dependent atelectasis. There is confluent right lower lobe atelectasis. No pleural effusion. Musculoskeletal: No acute or suspicious osseous lesion identified in the chest. Review of the MIP images confirms the above findings. CT ABDOMEN and PELVIS FINDINGS Hepatobiliary: Progressed liver metastatic disease since October. Numerous larger and more discrete hypodense, rim enhancing liver masses individually measuring up to 3.5-4 cm diameter. Surgically absent gallbladder. Pancreas: Heterogeneously enhancing body pancreatic mass appears larger than in October. Today this is 45 x 47 by 43 mm (AP by transverse by CC) versus 28 x 49 by about 31 mm previously. There is atrophy and ductal dilatation in the pancreatic tail. There is mild peripancreatic fat stranding. Small peripancreatic lymph nodes appear stable. No vascular occlusion about the mass. Spleen: Negative. Adrenals/Urinary Tract: Adrenal glands remain within normal limits. Bilateral renal enhancement and contrast excretion is within normal limits. Unremarkable urinary bladder. Stomach/Bowel: Mild retained stool in the distal colon. Redundant sigmoid tracking into the left mid abdomen. Mild redundancy of the transverse colon. No large bowel inflammation. Negative appendix. No dilated or inflamed small bowel. There is a 5 cm small bowel diverticulum suspected in the proximal jejunum (series 4, image 39) which was smaller and less apparent in October (on series 3, image 69 of that study). No surrounding inflammation. Prior ventral abdominal hernia repair with mesh. Decompressed stomach. 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 The duodenum appears stable and otherwise within normal limits. No abdominal free fluid. Vascular/Lymphatic: Major arterial structures in the abdomen and pelvis are patent. Portal venous system is patent. Outside of the peripancreatic  region there is no abdominal or pelvic lymphadenopathy. Reproductive: Surgically absent uterus.  Negative ovaries. Other: No pelvic free fluid. Musculoskeletal: No acute or suspicious osseous lesion identified. Review of the MIP images confirms the above findings. IMPRESSION: 1.  Negative for acute pulmonary embolus. 2. Progressed primary pancreatic tumor and liver metastases since 01/01/2017. 3. Increased solitary right upper lobe lung nodule since September suspicious for pulmonary oligometastatic disease. 4. Lower lung volumes with atelectasis.  No pleural effusion. 5. No bowel obstruction or other acute findings identified. Electronically Signed   By: Genevie Ann M.D.   On: 03/11/2017 14:08   Dg Chest Port 1 View  Result Date: 02/23/2017 CLINICAL DATA:  Shortness of breath. Weakness. History of pancreatic cancer on active chemotherapy. EXAM: PORTABLE CHEST 1 VIEW COMPARISON:  Radiograph 02/05/2017, CT 12/06/2016 FINDINGS: Right chest port with tip in the mid SVC. Lower lung volumes from prior exam. Unchanged heart size and mediastinal contours allowing for differences in technique. Streaky left basilar atelectasis. No confluent consolidation. Small pulmonary nodule in the right lung on prior exam is not seen currently, may be obscured. No pulmonary edema, large pleural effusion or pneumothorax. IMPRESSION: Low lung volumes with  streaky left basilar atelectasis. Electronically Signed   By: Jeb Levering M.D.   On: 02/23/2017 02:13     Subjective: No complains  Discharge Exam: Vitals:   03/13/17 2144 03/14/17 0447  BP: (!) 116/49 (!) 101/48  Pulse: (!) 117 (!) 109  Resp: 18 15  Temp: 98.3 F (36.8 C) 98.1 F (36.7 C)  SpO2: 94% 94%   Vitals:   03/13/17 0500 03/13/17 1310 03/13/17 2144 03/14/17 0447  BP: 92/69 113/70 (!) 116/49 (!) 101/48  Pulse: 92 89 (!) 117 (!) 109  Resp: 20 16 18 15   Temp:  98 F (36.7 C) 98.3 F (36.8 C) 98.1 F (36.7 C)  TempSrc: Oral Oral Oral Oral  SpO2: 97%  96% 94% 94%  Weight:      Height:        General: Pt is alert, awake, not in acute distress Cardiovascular: RRR, S1/S2 +, no rubs, no gallops Respiratory: CTA bilaterally, no wheezing, no rhonchi Abdominal: Soft, NT, ND, bowel sounds + Extremities: no edema, no cyanosis    The results of significant diagnostics from this hospitalization (including imaging, microbiology, ancillary and laboratory) are listed below for reference.     Microbiology: No results found for this or any previous visit (from the past 240 hour(s)).   Labs: BNP (last 3 results) No results for input(s): BNP in the last 8760 hours. Basic Metabolic Panel: Recent Labs  Lab 03/11/17 1207 03/12/17 2129 03/13/17 0459  NA 130* 135 135  K 3.7 3.9 3.8  CL 95* 101 103  CO2 25 22 25   GLUCOSE 164* 157* 135*  BUN 14 10 9   CREATININE 0.65 0.82 0.64  CALCIUM 8.7* 9.1 8.8*   Liver Function Tests: Recent Labs  Lab 03/11/17 1207 03/12/17 2129  AST 21 21  ALT 24 21  ALKPHOS 155* 172*  BILITOT 1.1 1.0  PROT 6.8 6.8  ALBUMIN 2.6* 2.7*   Recent Labs  Lab 03/11/17 1207 03/12/17 2129  LIPASE 19 19   No results for input(s): AMMONIA in the last 168 hours. CBC: Recent Labs  Lab 03/11/17 1207 03/12/17 2129 03/13/17 0459  WBC 4.5 6.3 5.1  NEUTROABS  --  4.5  --   HGB 10.6* 10.3* 9.6*  HCT 32.8* 31.8* 29.4*  MCV 85.9 84.4 85.2  PLT 146* 138* 113*   Cardiac Enzymes: No results for input(s): CKTOTAL, CKMB, CKMBINDEX, TROPONINI in the last 168 hours. BNP: Invalid input(s): POCBNP CBG: Recent Labs  Lab 03/13/17 0739 03/13/17 1141 03/13/17 1704 03/13/17 2143 03/14/17 0714  GLUCAP 106* 213* 108* 178* 103*   D-Dimer No results for input(s): DDIMER in the last 72 hours. Hgb A1c No results for input(s): HGBA1C in the last 72 hours. Lipid Profile No results for input(s): CHOL, HDL, LDLCALC, TRIG, CHOLHDL, LDLDIRECT in the last 72 hours. Thyroid function studies No results for input(s): TSH,  T4TOTAL, T3FREE, THYROIDAB in the last 72 hours.  Invalid input(s): FREET3 Anemia work up No results for input(s): VITAMINB12, FOLATE, FERRITIN, TIBC, IRON, RETICCTPCT in the last 72 hours. Urinalysis    Component Value Date/Time   COLORURINE YELLOW 03/13/2017 0102   APPEARANCEUR CLEAR 03/13/2017 0102   LABSPEC 1.021 03/13/2017 0102   LABSPEC 1.015 02/19/2017 1010   PHURINE 8.0 03/13/2017 0102   GLUCOSEU NEGATIVE 03/13/2017 0102   GLUCOSEU Negative 02/19/2017 1010   HGBUR NEGATIVE 03/13/2017 0102   BILIRUBINUR NEGATIVE 03/13/2017 0102   BILIRUBINUR Negative 02/19/2017 1010   KETONESUR 80 (A) 03/13/2017 0102   PROTEINUR 100 (  A) 03/13/2017 0102   UROBILINOGEN 0.2 02/19/2017 1010   NITRITE NEGATIVE 03/13/2017 0102   LEUKOCYTESUR NEGATIVE 03/13/2017 0102   LEUKOCYTESUR Trace 02/19/2017 1010   Sepsis Labs Invalid input(s): PROCALCITONIN,  WBC,  LACTICIDVEN Microbiology No results found for this or any previous visit (from the past 240 hour(s)).   Time coordinating discharge: Over 30 minutes  SIGNED:   Charlynne Cousins, MD  [facility] 03/14/2017, 12:21 PM Pager   If 7PM-7AM, please contact night-coverage [email or web address] Password [email or web address]

## 2017-03-14 NOTE — Progress Notes (Signed)
Patient ID: AVIYAH SWETZ, female   DOB: 1955/09/15, 61 y.o.   MRN: [id number]    Thank you for consulting the Palliative Medicine Team at [facility] to meet your patient's and family's needs.   The reason that you asked [place] to see your patient is to establish goals of care  We have scheduled your patient for a meeting: December 20 at noon, this is patient request so husband can be present.  Wadie Lessen NP  Palliative Medicine Team Team Phone # [phone] Pager [phone]  No charge

## 2017-03-14 NOTE — Progress Notes (Signed)
IP PROGRESS NOTE  Subjective:   [name] reports feeling better.  The pain is under adequate control at present.  She would like to have a urine culture.  She denies dysuria, but has a history of recurrent UTIs and is not symptomatic until she becomes systemically ill. No nausea or diarrhea.   Objective: Vital signs in last 24 hours: Blood pressure (!) 101/48, pulse (!) 109, temperature 98.1 F (36.7 C), temperature source Oral, resp. rate 15, height _0  (1.6 m), weight 260 lb (117.9 kg), SpO2 94 %.  Intake/Output from previous day: 12/18 0701 - 12/19 0700 In: 960 [P.O.:960] Out: 700 [Urine:700]  Physical Exam:  HEENT: Thrush  Abdomen: Nontender Extremities: No leg edema   Portacath/PICC-without erythema  Lab Results: Recent Labs    03/12/17 2129 03/13/17 0459  WBC 6.3 5.1  HGB 10.3* 9.6*  HCT 31.8* 29.4*  PLT 138* 113*    BMET Recent Labs    03/12/17 2129 03/13/17 0459  NA 135 135  K 3.9 3.8  CL 101 103  CO2 22 25  GLUCOSE 157* 135*  BUN 10 9  CREATININE 0.82 0.64  CALCIUM 9.1 8.8*     Medications: I have reviewed the patient's current medications.  Assessment/Plan: 1. Metastatic pancreas cancer ? Pancreas body mass and liver metastases noted on CT abdomen/pelvis 08/11/2016 ? Ultrasound-guided biopsy of a right liver lesion 08/16/2016 revealed poorly differentiated adenocarcinoma consistent with pancreas cancer ? Foundation 1-microsatellite stable; tumor mutational burden 1; ERBB2 amplification ? Cycle 1 gemcitabine/Abraxane 09/13/2016; 09/29/2016 ? Cycle 2 gemcitabine/Abraxane 10/11/2016, 10/18/2016 ? Cycle 3 gemcitabine/Abraxane 11/01/2016, 11/08/2016 ? Cycle 4 gemcitabine/Abraxane 11/23/2016,11/29/2016 ? CT chest 12/06/2016-liver lesions appear smaller ? Cycle 5 gemcitabine/Abraxane 12/13/2016 ? CT abdomen/pelvis 01/01/2017-new and enlarging hepatic masses. Enlarging pancreatic mass. ? Cycle 6 gemcitabine/Abraxane 01/03/2017 ? Rising CA  19-9, increased pain October 2018 ? Cycle 1 FOLFOX 01/31/2017 ? Cycle 2 FOLFOX 02/19/2017 ? Cycle 3 FOLFIRINOX 03/06/2017 ? CT 03/11/2017 (compared to 01/01/2017) increased liver lesions and enlargement of the pancreas mass  2. Pain secondary to pancreas cancer, MS Contin added 03/13/2017  3. Hypertension  4. Sleep apnea  5. Chronic low back pain  6. Recurrent urinary tract infections  7. Depression  8. Migraine headaches  9. Port-A-Cath placement 08/28/2016  10. Rash following cycle 1 gemcitabine/Abraxane-drug rash?  11. Nausea/vomiting following cycle 1 gemcitabine/Abraxane-antiemetic regimen adjusted with addition of Aloxi  12. Diabetes  13. Right cephalic vein thrombosis [id number]/25/9563-[id number] with Xarelto  14. CT chest 12/06/2016 done to evaluate dyspnea-several 3-4 mm nodular opacities in the lung parenchyma etiology uncertain. Known mass in the body of the pancreas measuring 3.3 x 2.4 cm;small enhancing lesion in the anterior dome of the liver measures 8 mm.  15. Right forearm rash following cycle 5 day 8 gemcitabine/Abraxane. Resolved   Heather Frederick appears improved today.  She will continue MS Contin with Dilaudid for breakthrough pain.  We will check a urine culture.  She is scheduled for outpatient follow-up at the Cancer center 03/19/2017. I discussed the 03/11/2017 CT findings with Ms. Mishkin and her husband.  The plan is to continue FOLFIRINOX chemotherapy if her performance status remains improved.  Recommendations: 1.  Continue MS Contin/Dilaudid 2.  Outpatient follow-up at the Cancer center 03/19/2017   LOS: 1 day   Betsy Coder, MD   03/14/2017, 6:59 AM

## 2017-03-14 NOTE — Progress Notes (Signed)
Physical therapy in to see patient. Patient stated she felt a little lightheaded. Sitting BP was 107/58. Patient stood and ambulated about ten ft in the room and said she felt more lightheaded.dizzy. Patients BP then 73/55. Patient assisted back to bed, MD paged. Orders received to cancel discharge, 1000 ML bolus, and continue IV fluids at 75/hr for 24 hours. Will continue to monitor patient.

## 2017-03-14 NOTE — Evaluation (Signed)
Physical Therapy Evaluation Patient Details Name: Heather Frederick MRN: [id number] DOB: 11-21-1955 Today's Date: 03/14/2017   History of Present Illness  61 y.o. female past medical history of pancreatic cancer diagnosed in May 2018 on chemotherapy came into the ED 12/17  with uncontrollable pain mainly in the periumbilical area.  Clinical Impression  The patient complained of nausea, sweaty and dizziness. Recently given pain medication. Earlier she ambulated in the hall. BP sitting 107/58, Standing 73/58. HR 106. RN notified. Pt admitted with above diagnosis. Pt currently with functional limitations due to the deficits listed below (see PT Problem List).  Pt will benefit from skilled PT to increase their independence and safety with mobility to allow discharge to the venue listed below.        Follow Up Recommendations No PT follow up-pt. Declined.    Equipment Recommendations  None recommended by PT    Recommendations for Other Services       Precautions / Restrictions Precautions Precautions: Fall Precaution Comments: monitor BP      Mobility  Bed Mobility Overal bed mobility: Needs Assistance Bed Mobility: Supine to Sit;Sit to Supine     Supine to sit: Supervision Sit to supine: Supervision      Transfers Overall transfer level: Needs assistance Equipment used: Rolling walker (2 wheeled) Transfers: Sit to/from Stand Sit to Stand: Min assist         General transfer comment: x 3 from bed, last time needed assistance to steady  Ambulation/Gait Ambulation/Gait assistance: Min assist Ambulation Distance (Feet): 10 Feet(x 2) Assistive device: Rolling walker (2 wheeled) Gait Pattern/deviations: Step-to pattern;Step-through pattern;Staggering left;Staggering right     General Gait Details: gait is very slow, noted to be diaphoretic, c/o nausea.  assisted back into bed after rest  Stairs            Wheelchair Mobility    Modified Rankin (Stroke Patients  Only)       Balance                                             Pertinent Vitals/Pain Pain Assessment: Faces Faces Pain Scale: Hurts little more Pain Location: abd Pain Descriptors / Indicators: Discomfort Pain Intervention(s): Premedicated before session;Monitored during session    [place] expects to be discharged to:: Private residence Living Arrangements: Spouse/significant other Available Help at Discharge: Family Type of Home: House Home Access: Stairs to enter   Technical brewer of Steps: 3 Home Layout: Able to live on main level with bedroom/bathroom;Two level Home Equipment: Walker - 2 wheels      Prior Function Level of Independence: Independent with assistive device(s)               Hand Dominance        Extremity/Trunk Assessment   Upper Extremity Assessment Upper Extremity Assessment: Generalized weakness    Lower Extremity Assessment Lower Extremity Assessment: Generalized weakness       Communication   Communication: No difficulties  Cognition Arousal/Alertness: Lethargic;Suspect due to medications                                     General Comments: speech is slurred      General Comments      Exercises     Assessment/Plan  PT Assessment Patient needs continued PT services  PT Problem List Decreased strength;Decreased activity tolerance;Decreased mobility;Cardiopulmonary status limiting activity       PT Treatment Interventions DME instruction;Gait training;Stair training;Functional mobility training;Therapeutic activities;Patient/family education    PT Goals (Current goals can be found in the Care Plan section)  Acute Rehab PT Goals Patient Stated Goal: to go home PT Goal Formulation: With patient Time For Goal Achievement: 03/21/17 Potential to Achieve Goals: Good    Frequency Min 2X/week   Barriers to discharge        Co-evaluation                AM-PAC PT "6 Clicks" Daily Activity  Outcome Measure Difficulty turning over in bed (including adjusting bedclothes, sheets and blankets)?: None Difficulty moving from lying on back to sitting on the side of the bed? : None Difficulty sitting down on and standing up from a chair with arms (e.g., wheelchair, bedside commode, etc,.)?: A Lot Help needed moving to and from a bed to chair (including a wheelchair)?: A Lot Help needed walking in hospital room?: A Lot Help needed climbing 3-5 steps with a railing? : A Lot 6 Click Score: 16    End of Session   Activity Tolerance: Patient limited by fatigue;Treatment limited secondary to medical complications (Comment) Patient left: in bed;with call bell/phone within reach;with nursing/sitter in room Nurse Communication: Mobility status PT Visit Diagnosis: Difficulty in walking, not elsewhere classified (R26.2);Unsteadiness on feet (R26.81)    Time: 1230-1300 PT Time Calculation (min) (ACUTE ONLY): 30 min   Charges:     PT Treatments $Gait Training: 8-22 mins   PT G CodesTresa Endo PT [phone]   Claretha Cooper 03/14/2017, 1:48 PM

## 2017-03-15 DIAGNOSIS — I951 Orthostatic hypotension: Secondary | ICD-10-CM

## 2017-03-15 LAB — GLUCOSE, CAPILLARY: Glucose-Capillary: 100 mg/dL — ABNORMAL HIGH (ref 65–99)

## 2017-03-15 MED ORDER — SODIUM CHLORIDE 0.9% FLUSH
10.0000 mL | INTRAVENOUS | Status: DC | PRN
  Administered 2017-03-15: 10 mL
  Filled 2017-03-15: qty 40

## 2017-03-15 MED ORDER — HEPARIN SOD (PORK) LOCK FLUSH 100 UNIT/ML IV SOLN
500.0000 [IU] | INTRAVENOUS | Status: AC | PRN
  Administered 2017-03-15: 12:00:00 500 [IU]

## 2017-03-15 MED ORDER — FUROSEMIDE 20 MG PO TABS: 10.0000 mg | ORAL_TABLET | Freq: Once | ORAL | Status: DC

## 2017-03-15 MED FILL — MORPHINE SULF ER 30 MG TAB: 30 | 5 days supply | Qty: 10 | Fill #0

## 2017-03-15 NOTE — Progress Notes (Signed)
[facility] PROGRESS NOTE    Progress Note  SHALAMAR PLOURDE  CHE:[id number] DOB: 03-Jul-1955 DOA: 03/12/2017 PCP: [name], MD     Brief Narrative:   Heather Frederick is an 61 y.o. female past medical history of pancreatic cancer diagnosed in May 2018 on chemotherapy came into the ED for 24 hours prior to admission and he did a CT Angie of this showed progression of his pancreatic cancer, was discharged home after pain was controlled but came back with uncontrollable pain mainly in the periumbilical area.  Assessment/Plan:   Abdominal pain, generalized likely due to pancreatic cancer: Pain is controlled on oral narcotics.  History of right cephalic vein thrombosis: Continue Xarelto.  Diabetes mellitus type 2: Continue long-acting insulin plus sliding scale.  Chronic Diastolic heart failure, stage B (HCC) With no antihypertensive medication or heart failure medications on her home list. At this point she appears to be euvolemic KVO IV fluids.  OSA (obstructive sleep apnea)  Benign essential HTN Continue to keep her off antihypertensive medication.  Chronic anticoagulation CONT XARELTO.  Orthostatic hypotension: She was given IV fluids overnight and her static hypotension resolved.   DVT prophylaxis: Xarelto Family Communication:none Disposition Plan/Barrier to D/C: Discharged today. Code Status:     Code Status Orders  (From admission, onward)        Start     Ordered   03/12/17 2344  Full code  Continuous     03/12/17 2344    Code Status History    Date Active Date Inactive Code Status Order ID Comments User Context   02/23/2017 15:23 02/24/2017 17:58 Full Code [id number]  Annita Brod, MD ED   02/05/2017 15:35 02/06/2017 16:23 Full Code [id number]  [name], MD Inpatient   06/30/2016 11:03 07/01/2016 13:30 Full Code [id number]  Jackolyn Confer, MD Inpatient   05/06/2016 23:18 05/07/2016 21:01 Full Code [id number]  Etta Quill, DO ED   06/11/2014  18:28 06/12/2014 13:25 Full Code [id number]  Jackolyn Confer, MD Inpatient   03/10/2014 14:31 03/10/2014 21:12 Full Code [id number]  Belva Crome, MD Inpatient   09/10/2012 11:38 09/12/2012 17:57 Full Code [id number]  Garald Balding, MD Inpatient        IV Access:    Peripheral IV   Procedures and diagnostic studies:   No results found.   Medical Consultants:    None.  Anti-Infectives:   None  Subjective:    [name] she relates her pain is controlled.  Objective:    Vitals:   03/13/17 2144 03/14/17 0447 03/14/17 2259 03/15/17 0630  BP: (!) 116/49 (!) 101/48 (!) 106/49 (!) 112/48  Pulse: (!) 117 (!) 109 86 (!) 106  Resp: 18 15 17 16   Temp: 98.3 F (36.8 C) 98.1 F (36.7 C) 98 F (36.7 C) 98.1 F (36.7 C)  TempSrc: Oral Oral Oral Oral  SpO2: 94% 94% 94% 98%  Weight:      Height:        Intake/Output Summary (Last 24 hours) at 03/15/2017 1041 Last data filed at 03/15/2017 0916 Gross per 24 hour  Intake 3511.25 ml  Output 1100 ml  Net 2411.25 ml   Filed Weights   03/13/17 0145  Weight: 117.9 kg (260 lb)    Exam: General exam: In no acute distress, morbidly obese. Respiratory system: Good air movement and clear to auscultation. Cardiovascular system: S1 & S2 heard, RRR.   Gastrointestinal system: Positive bowel sounds soft nontender nondistended. Central nervous system: Alert and oriented.  No focal neurological deficits. Extremities: No pedal edema. Skin: No rashes, lesions or ulcers Psychiatry: Judgement and insight appear normal. Mood & affect appropriate.    Data Reviewed:    Labs: Basic Metabolic Panel: Recent Labs  Lab 03/11/17 1207 03/12/17 2129 03/13/17 0459  NA 130* 135 135  K 3.7 3.9 3.8  CL 95* 101 103  CO2 25 22 25   GLUCOSE 164* 157* 135*  BUN 14 10 9   CREATININE 0.65 0.82 0.64  CALCIUM 8.7* 9.1 8.8*   GFR Estimated Creatinine Clearance: 94 mL/min (by C-G formula based on SCr of 0.64 mg/dL). Liver Function  Tests: Recent Labs  Lab 03/11/17 1207 03/12/17 2129  AST 21 21  ALT 24 21  ALKPHOS 155* 172*  BILITOT 1.1 1.0  PROT 6.8 6.8  ALBUMIN 2.6* 2.7*   Recent Labs  Lab 03/11/17 1207 03/12/17 2129  LIPASE 19 19   No results for input(s): AMMONIA in the last 168 hours. Coagulation profile Recent Labs  Lab 03/12/17 2129  INR 1.28    CBC: Recent Labs  Lab 03/11/17 1207 03/12/17 2129 03/13/17 0459  WBC 4.5 6.3 5.1  NEUTROABS  --  4.5  --   HGB 10.6* 10.3* 9.6*  HCT 32.8* 31.8* 29.4*  MCV 85.9 84.4 85.2  PLT 146* 138* 113*   Cardiac Enzymes: No results for input(s): CKTOTAL, CKMB, CKMBINDEX, TROPONINI in the last 168 hours. BNP (last 3 results) No results for input(s): PROBNP in the last 8760 hours. CBG: Recent Labs  Lab 03/14/17 0714 03/14/17 1227 03/14/17 1706 03/14/17 2106 03/15/17 0734  GLUCAP 103* 168* 108* 166* 100*   D-Dimer: No results for input(s): DDIMER in the last 72 hours. Hgb A1c: No results for input(s): HGBA1C in the last 72 hours. Lipid Profile: No results for input(s): CHOL, HDL, LDLCALC, TRIG, CHOLHDL, LDLDIRECT in the last 72 hours. Thyroid function studies: No results for input(s): TSH, T4TOTAL, T3FREE, THYROIDAB in the last 72 hours.  Invalid input(s): FREET3 Anemia work up: No results for input(s): VITAMINB12, FOLATE, FERRITIN, TIBC, IRON, RETICCTPCT in the last 72 hours. Sepsis Labs: Recent Labs  Lab 03/11/17 1207 03/12/17 2129 03/12/17 2242 03/13/17 0459  WBC 4.5 6.3  --  5.1  LATICACIDVEN  --   --  1.18  --    Microbiology Recent Results (from the past 240 hour(s))  Urine culture     Status: Abnormal (Preliminary result)   Collection Time: 03/14/17 10:36 AM  Result Value Ref Range Status   Specimen Description   Final    URINE, CLEAN CATCH Performed at [facility], [place] [address] [place], [place] [address]    Special Requests   Final    NONE Performed at [facility],  [place] [address] [place], [place] [address]    Culture 50,000 COLONIES/mL GRAM NEGATIVE RODS (A)  Final   Report Status PENDING  Incomplete     Medications:   . aspirin EC  81 mg Oral Daily  . citalopram  20 mg Oral Daily  . vitamin B-12  2,500 mcg Oral Daily  . cyclobenzaprine  5 mg Oral TID  . famotidine  20 mg Oral BID  . insulin aspart  0-9 Units Subcutaneous TID WC  . insulin detemir  10 Units Subcutaneous QHS  . morphine  30 mg Oral Q12H  . multivitamin with minerals  1 tablet Oral Daily  . oxybutynin  10 mg Oral q morning - 10a  . pantoprazole  40 mg  Oral Daily  . pregabalin  200 mg Oral Daily  . rivaroxaban  20 mg Oral Daily   Continuous Infusions:   LOS: 2 days   Charlynne Cousins  [facility] Pager [phone]  *Please refer to [email or web address], password [email or web address] to get updated schedule on who will round on this patient, as hospitalists switch teams weekly. If 7PM-7AM, please contact night-coverage at [email or web address], password [email or web address] for any overnight needs.  03/15/2017, 10:41 AM

## 2017-03-15 NOTE — Progress Notes (Signed)
Nurse placed order to de-access port due to patient being discharged.

## 2017-03-16 ENCOUNTER — Telehealth: Payer: Self-pay | Admitting: *Deleted

## 2017-03-16 DIAGNOSIS — N3000 Acute cystitis without hematuria: Secondary | ICD-10-CM

## 2017-03-16 LAB — URINE CULTURE

## 2017-03-16 MED ORDER — CEPHALEXIN 500 MG PO CAPS: 500.0000 mg | ORAL_CAPSULE | Freq: Two times a day (BID) | ORAL | 0 refills | Status: DC

## 2017-03-16 NOTE — Telephone Encounter (Signed)
-----   Message from Heather Pier, MD sent at 03/16/2017  5:15 PM EST ----- Please call patient, urine culture is positive for Klebsiella pneumoniae Start Keflex 500 mg 3 times daily for 7 days

## 2017-03-16 NOTE — Telephone Encounter (Signed)
Called pt with antibiotic instructions, per Dr. Benay Spice: Take Keflex BID not TID. Pt voiced understanding.

## 2017-03-18 ENCOUNTER — Other Ambulatory Visit: Payer: Self-pay | Admitting: Oncology

## 2017-03-19 ENCOUNTER — Ambulatory Visit (HOSPITAL_BASED_OUTPATIENT_CLINIC_OR_DEPARTMENT_OTHER): Payer: 59

## 2017-03-19 ENCOUNTER — Ambulatory Visit (HOSPITAL_BASED_OUTPATIENT_CLINIC_OR_DEPARTMENT_OTHER): Payer: 59 | Admitting: Oncology

## 2017-03-19 ENCOUNTER — Telehealth: Payer: Self-pay | Admitting: *Deleted

## 2017-03-19 ENCOUNTER — Other Ambulatory Visit (HOSPITAL_BASED_OUTPATIENT_CLINIC_OR_DEPARTMENT_OTHER): Payer: 59

## 2017-03-19 VITALS — BP 111/42 | HR 95 | Temp 98.3°F | Resp 20 | Wt 262.8 lb

## 2017-03-19 DIAGNOSIS — Z95828 Presence of other vascular implants and grafts: Secondary | ICD-10-CM

## 2017-03-19 DIAGNOSIS — Z452 Encounter for adjustment and management of vascular access device: Secondary | ICD-10-CM | POA: Diagnosis not present

## 2017-03-19 DIAGNOSIS — C251 Malignant neoplasm of body of pancreas: Secondary | ICD-10-CM

## 2017-03-19 DIAGNOSIS — C259 Malignant neoplasm of pancreas, unspecified: Secondary | ICD-10-CM

## 2017-03-19 DIAGNOSIS — E119 Type 2 diabetes mellitus without complications: Secondary | ICD-10-CM

## 2017-03-19 DIAGNOSIS — M62830 Muscle spasm of back: Secondary | ICD-10-CM

## 2017-03-19 DIAGNOSIS — C787 Secondary malignant neoplasm of liver and intrahepatic bile duct: Secondary | ICD-10-CM | POA: Diagnosis not present

## 2017-03-19 DIAGNOSIS — I10 Essential (primary) hypertension: Secondary | ICD-10-CM

## 2017-03-19 DIAGNOSIS — F329 Major depressive disorder, single episode, unspecified: Secondary | ICD-10-CM

## 2017-03-19 DIAGNOSIS — G893 Neoplasm related pain (acute) (chronic): Secondary | ICD-10-CM

## 2017-03-19 DIAGNOSIS — D709 Neutropenia, unspecified: Secondary | ICD-10-CM

## 2017-03-19 LAB — CBC WITH DIFFERENTIAL/PLATELET
BASO%: 0.3 % (ref 0.0–2.0)
BASOS ABS: 0 10*3/uL (ref 0.0–0.1)
EOS ABS: 0.3 10*3/uL (ref 0.0–0.5)
EOS%: 7.4 % — ABNORMAL HIGH (ref 0.0–7.0)
HCT: 30.3 % — ABNORMAL LOW (ref 34.8–46.6)
HEMOGLOBIN: 9.4 g/dL — AB (ref 11.6–15.9)
LYMPH#: 1.2 10*3/uL (ref 0.9–3.3)
LYMPH%: 31.3 % (ref 14.0–49.7)
MCH: 26.9 pg (ref 25.1–34.0)
MCHC: 31 g/dL — ABNORMAL LOW (ref 31.5–36.0)
MCV: 86.8 fL (ref 79.5–101.0)
MONO#: 0.9 10*3/uL (ref 0.1–0.9)
MONO%: 23.9 % — AB (ref 0.0–14.0)
NEUT#: 1.4 10*3/uL — ABNORMAL LOW (ref 1.5–6.5)
NEUT%: 37.1 % — ABNORMAL LOW (ref 38.4–76.8)
NRBC: 0 % (ref 0–0)
PLATELETS: 179 10*3/uL (ref 145–400)
RBC: 3.49 10*6/uL — AB (ref 3.70–5.45)
RDW: 16.3 % — AB (ref 11.2–14.5)
WBC: 3.8 10*3/uL — AB (ref 3.9–10.3)

## 2017-03-19 LAB — COMPREHENSIVE METABOLIC PANEL
ALBUMIN: 2.7 g/dL — AB (ref 3.5–5.0)
ALK PHOS: 150 U/L (ref 40–150)
ALT: 20 U/L (ref 0–55)
ANION GAP: 13 meq/L — AB (ref 3–11)
AST: 24 U/L (ref 5–34)
BILIRUBIN TOTAL: 0.42 mg/dL (ref 0.20–1.20)
BUN: 6.2 mg/dL — ABNORMAL LOW (ref 7.0–26.0)
CO2: 24 meq/L (ref 22–29)
CREATININE: 0.8 mg/dL (ref 0.6–1.1)
Calcium: 8.8 mg/dL (ref 8.4–10.4)
Chloride: 100 mEq/L (ref 98–109)
EGFR: >60 mL/min/{1.73_m2} (ref >60)
GLUCOSE: 158 mg/dL — AB (ref 70–140)
Potassium: 3.4 mEq/L — ABNORMAL LOW (ref 3.5–5.1)
Sodium: 137 mEq/L (ref 136–145)
TOTAL PROTEIN: 6.6 g/dL (ref 6.4–8.3)

## 2017-03-19 MED ORDER — CYCLOBENZAPRINE HCL 5 MG PO TABS: 5.0000 mg | ORAL_TABLET | Freq: Three times a day (TID) | ORAL | 0 refills | Status: DC | PRN

## 2017-03-19 MED ORDER — MORPHINE SULFATE ER 30 MG PO TBCR: 30.0000 mg | EXTENDED_RELEASE_TABLET | Freq: Two times a day (BID) | ORAL | 0 refills | Status: DC

## 2017-03-19 MED ORDER — SODIUM CHLORIDE 0.9% FLUSH
10.0000 mL | INTRAVENOUS | Status: DC | PRN
  Administered 2017-03-19: 10 mL via INTRAVENOUS
  Filled 2017-03-19: qty 10

## 2017-03-19 MED ORDER — HEPARIN SOD (PORK) LOCK FLUSH 100 UNIT/ML IV SOLN
500.0000 [IU] | Freq: Once | INTRAVENOUS | Status: AC | PRN
Start: 2017-03-19 — End: 2017-03-19
  Administered 2017-03-19: 500 [IU] via INTRAVENOUS
  Filled 2017-03-19: qty 5

## 2017-03-19 MED ORDER — HEPARIN SOD (PORK) LOCK FLUSH 100 UNIT/ML IV SOLN
500.0000 [IU] | Freq: Once | INTRAVENOUS | Status: AC | PRN
  Administered 2017-03-19: 500 [IU] via INTRAVENOUS
  Filled 2017-03-19: qty 5

## 2017-03-19 MED ORDER — OXYBUTYNIN CHLORIDE ER 10 MG PO TB24: 10.0000 mg | ORAL_TABLET | Freq: Every morning | ORAL | 2 refills | Status: DC

## 2017-03-19 NOTE — Progress Notes (Signed)
Heather Frederick OFFICE PROGRESS NOTE   Diagnosis: Pancreas cancer  INTERVAL HISTORY:   Heather Frederick was discharged 03/14/2017 after admission 03/12/2017 with increased pain.  He reports becoming hot and diaphoretic 2-3 days after each cycle of FOLFOX.  Her pain is under better control with MS Contin.  She has not used Dilaudid for breakthrough pain since discharge from the hospital.  She reports her blood sugars under good control.  Good appetite.  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  This has not progressed since beginning oxaliplatin-based therapy.  Objective:  Vital signs in last 24 hours:  Blood pressure (!) 111/42, pulse 95, temperature 98.3 F (36.8 C), temperature source Oral, resp. rate 20, weight 262 lb 12.8 oz (119.2 kg), SpO2 100 %.    HEENT: No thrush or ulcers Resp: Lungs clear bilaterally Cardio: Regular rate and rhythm GI: No hepatomegaly, no mass Vascular: No leg edema  Skin: Palms without erythema  Portacath/PICC-without erythema  Lab Results:  Lab Results  Component Value Date   WBC 3.8 (L) 03/19/2017   HGB 9.4 (L) 03/19/2017   HCT 30.3 (L) 03/19/2017   MCV 86.8 03/19/2017   PLT 179 03/19/2017   NEUTROABS 1.4 (L) 03/19/2017    CMP     Component Value Date/Time   NA 137 03/19/2017 1422   K 3.4 (L) 03/19/2017 1422   CL 103 03/13/2017 0459   CO2 24 03/19/2017 1422   GLUCOSE 158 (H) 03/19/2017 1422   BUN 6.2 (L) 03/19/2017 1422   CREATININE 0.8 03/19/2017 1422   CALCIUM 8.8 03/19/2017 1422   PROT 6.6 03/19/2017 1422   ALBUMIN 2.7 (L) 03/19/2017 1422   AST 24 03/19/2017 1422   ALT 20 03/19/2017 1422   ALKPHOS 150 03/19/2017 1422   BILITOT 0.42 03/19/2017 1422   GFRNONAA >60 03/13/2017 0459   GFRAA >60 03/13/2017 0459    Medications: I have reviewed the patient's current medications.   Assessment/Plan:  1. Metastatic pancreas cancer ? Pancreas body mass and liver metastases noted on CT abdomen/pelvis  08/11/2016 ? Ultrasound-guided biopsy of a right liver lesion 08/16/2016 revealed poorly differentiated adenocarcinoma consistent with pancreas cancer ? Foundation 1-microsatellite stable; tumor mutational burden 1; ERBB2 amplification ? Cycle 1 gemcitabine/Abraxane 09/13/2016; 09/29/2016 ? Cycle 2 gemcitabine/Abraxane 10/11/2016, 10/18/2016 ? Cycle 3 gemcitabine/Abraxane 11/01/2016, 11/08/2016 ? Cycle 4 gemcitabine/Abraxane 11/23/2016,11/29/2016 ? CT chest 12/06/2016-liver lesions appear smaller ? Cycle 5 gemcitabine/Abraxane 12/13/2016 ? CT abdomen/pelvis 01/01/2017-new and enlarging hepatic masses. Enlarging pancreatic mass. ? Cycle 6 gemcitabine/Abraxane 01/03/2017 ? Rising CA 19-9, increased pain October 2018 ? Cycle 1 FOLFOX 01/31/2017 ? Cycle 2 FOLFOX 02/19/2017 ? Cycle 3FOLFIRINOX 03/06/2017 ? CT 03/11/2017 (compared to 01/01/2017) increased liver lesions and enlargement of the pancreas mass ? Cycle 5 FOLFIRINOX 03/21/2017 (oxaliplatin dose reduced and Neulasta added)  2. Pain secondary to pancreas cancer, MS Contin added 03/13/2017  3. Hypertension  4. Sleep apnea  5. Chronic low back pain  6. Recurrent urinary tract infections  7. Depression  8. Migraine headaches  9. Port-A-Cath placement 08/28/2016  10. Rash following cycle 1 gemcitabine/Abraxane-drug rash?  11. Nausea/vomiting following cycle 1 gemcitabine/Abraxane-antiemetic regimen adjusted with addition of Aloxi  12. Diabetes  13. Right cephalic vein thrombosis [id number] with Xarelto  14. CT chest 12/06/2016 done to evaluate dyspnea-several 3-4 mm nodular opacities in the lung parenchyma etiology uncertain. Known mass in the body of the pancreas measuring 3.3 x 2.4 cm;small enhancing lesion in the anterior dome of the liver measures 8 mm.  15.  Right forearm rash following cycle 5 day 8 gemcitabine/Abraxane. Resolved  16.  Klebsiella urinary tract infection  03/14/2017   Disposition: Heather Frederick appears well today.  Her pain is under better control with MS Contin.  She will continue the current narcotic regimen.  She has completed 3 cycles of FOLFIRINOX, 1 cycle contained irinotecan.  The recent CT revealed disease progression compared to a CT from October and the CA 19-9 is higher.  She has only received 1 cycle with oxaliplatin and irinotecan together.  The plan is to proceed with another cycle of FOLFIRINOX 03/21/2017.  She has mild neutropenia today.  We discussed the risks of infection.  The plan is to proceed with cycle 4 FOLFIRINOX with Neulasta support.  We reviewed the potential toxicities associated with Neulasta.  Heather Frederick will receive intravenous fluids on day 3 following the cycle.  She will return for an office visit on day 8.  She will be scheduled for the next cycle of chemotherapy on 04/09/2017.  25 minutes were spent with the patient today.  The majority of the time was used for counseling and coordination of care.  Betsy Coder, MD  03/19/2017  3:55 PM

## 2017-03-19 NOTE — Progress Notes (Signed)
Unable to draw labs from port today. Dr. Benay Spice made aware, plan to administer alteplase during infusion appt on 12/26. Pt and husband in agreement with plan.

## 2017-03-19 NOTE — Telephone Encounter (Signed)
Fax from [facility]: Pt called over the weekend with constipation, was instructed to go to ED.  Called pt to follow up, she reports she miscalculated, it had only been 5 days without BM. She took ex-lax with relief. Informed pt she can be seen earlier today, MD has opening at 2:30. She will try to come in earlier.

## 2017-03-21 ENCOUNTER — Ambulatory Visit (HOSPITAL_BASED_OUTPATIENT_CLINIC_OR_DEPARTMENT_OTHER): Payer: 59 | Admitting: Medical

## 2017-03-21 ENCOUNTER — Ambulatory Visit (HOSPITAL_BASED_OUTPATIENT_CLINIC_OR_DEPARTMENT_OTHER): Payer: 59

## 2017-03-21 VITALS — BP 100/58 | HR 77 | Temp 97.8°F | Resp 20

## 2017-03-21 DIAGNOSIS — C787 Secondary malignant neoplasm of liver and intrahepatic bile duct: Secondary | ICD-10-CM | POA: Diagnosis not present

## 2017-03-21 DIAGNOSIS — C259 Malignant neoplasm of pancreas, unspecified: Secondary | ICD-10-CM | POA: Diagnosis not present

## 2017-03-21 DIAGNOSIS — R61 Generalized hyperhidrosis: Secondary | ICD-10-CM

## 2017-03-21 DIAGNOSIS — Z5111 Encounter for antineoplastic chemotherapy: Secondary | ICD-10-CM

## 2017-03-21 MED ORDER — DEXTROSE 5 % IV SOLN
300.0000 mg/m2 | Freq: Once | INTRAVENOUS | Status: DC
  Filled 2017-03-21: qty 23

## 2017-03-21 MED ORDER — IRINOTECAN HCL CHEMO INJECTION 100 MG/5ML
125.0000 mg/m2 | Freq: Once | INTRAVENOUS | Status: AC
  Administered 2017-03-21: 200 mg via INTRAVENOUS
  Filled 2017-03-21: qty 10

## 2017-03-21 MED ORDER — SODIUM CHLORIDE 0.9 % IV SOLN
Freq: Once | INTRAVENOUS | Status: AC
  Administered 2017-03-21: 09:00:00 via INTRAVENOUS
  Filled 2017-03-21: qty 5

## 2017-03-21 MED ORDER — ATROPINE SULFATE 1 MG/ML IJ SOLN
0.5000 mg | Freq: Once | INTRAMUSCULAR | Status: AC | PRN
  Administered 2017-03-21: 0.5 mg via INTRAVENOUS

## 2017-03-21 MED ORDER — SODIUM CHLORIDE 0.9 % IV SOLN
2400.0000 mg/m2 | INTRAVENOUS | Status: DC
  Administered 2017-03-21: 3650 mg via INTRAVENOUS
  Filled 2017-03-21: qty 73

## 2017-03-21 MED ORDER — OXALIPLATIN CHEMO INJECTION 100 MG/20ML
65.0000 mg/m2 | Freq: Once | INTRAVENOUS | Status: AC
  Administered 2017-03-21: 100 mg via INTRAVENOUS
  Filled 2017-03-21: qty 20

## 2017-03-21 MED ORDER — PALONOSETRON HCL INJECTION 0.25 MG/5ML
0.2500 mg | Freq: Once | INTRAVENOUS | Status: AC
  Administered 2017-03-21: 0.25 mg via INTRAVENOUS

## 2017-03-21 MED ORDER — PALONOSETRON HCL INJECTION 0.25 MG/5ML
INTRAVENOUS | Status: AC
  Filled 2017-03-21: qty 5

## 2017-03-21 MED ORDER — SODIUM CHLORIDE 0.9 % IV SOLN
300.0000 mg/m2 | Freq: Once | INTRAVENOUS | Status: AC
  Administered 2017-03-21: 460 mg via INTRAVENOUS
  Filled 2017-03-21: qty 23

## 2017-03-21 MED ORDER — ATROPINE SULFATE 1 MG/ML IJ SOLN
INTRAMUSCULAR | Status: AC
Start: 2017-03-21 — End: 2017-03-21
  Filled 2017-03-21: qty 1

## 2017-03-21 MED ORDER — DEXTROSE 5 % IV SOLN: Freq: Once | INTRAVENOUS | Status: DC

## 2017-03-21 NOTE — Progress Notes (Signed)
Red area with white center noted on port. No drainage or pain noted. Dr. Benay Spice to chairside to assess. Per Dr. Benay Spice okay to use port and proceed with treatment with 03/19/17 labs (ANC 1.4) and temp of 99.3.

## 2017-03-21 NOTE — Patient Instructions (Addendum)
[facility] Discharge Instructions for Patients Receiving Chemotherapy  Today you received the following chemotherapy agents: Oxaliplatin, Irinotecan, adrucil and Leucovorin  To help prevent nausea and vomiting after your treatment, we encourage you to take your nausea medication as directed.    If you develop nausea and vomiting that is not controlled by your nausea medication, call the clinic.   BELOW ARE SYMPTOMS THAT SHOULD BE REPORTED IMMEDIATELY:  *FEVER GREATER THAN 100.5 F  *CHILLS WITH OR WITHOUT FEVER  NAUSEA AND VOMITING THAT IS NOT CONTROLLED WITH YOUR NAUSEA MEDICATION  *UNUSUAL SHORTNESS OF BREATH  *UNUSUAL BRUISING OR BLEEDING  TENDERNESS IN MOUTH AND THROAT WITH OR WITHOUT PRESENCE OF ULCERS  *URINARY PROBLEMS  *BOWEL PROBLEMS  UNUSUAL RASH Items with * indicate a potential emergency and should be followed up as soon as possible.  Feel free to call the clinic should you have any questions or concerns. The clinic phone number is [phone].  Please show the [facility] at check-in to the Emergency Department and triage nurse   Fluorouracil, 5-FU injection What is this medicine? FLUOROURACIL, 5-FU (flure oh YOOR a sil) is a chemotherapy drug. It slows the growth of cancer cells. This medicine is used to treat many types of cancer like breast cancer, colon or rectal cancer, pancreatic cancer, and stomach cancer. This medicine may be used for other purposes; ask your health care provider or pharmacist if you have questions. COMMON BRAND NAME(S): Adrucil What should I tell my health care provider before I take this medicine? They need to know if you have any of these conditions: -blood disorders -dihydropyrimidine dehydrogenase (DPD) deficiency -infection (especially a virus infection such as chickenpox, cold sores, or herpes) -kidney disease -liver disease -malnourished, poor nutrition -recent or ongoing radiation therapy -an unusual  or allergic reaction to fluorouracil, other chemotherapy, other medicines, foods, dyes, or preservatives -pregnant or trying to get pregnant -breast-feeding How should I use this medicine? This drug is given as an infusion or injection into a vein. It is administered in a hospital or clinic by a specially trained health care professional. Talk to your pediatrician regarding the use of this medicine in children. Special care may be needed. Overdosage: If you think you have taken too much of this medicine contact a poison control center or emergency room at once. NOTE: This medicine is only for you. Do not share this medicine with others. What if I miss a dose? It is important not to miss your dose. Call your doctor or health care professional if you are unable to keep an appointment. What may interact with this medicine? -allopurinol -cimetidine -dapsone -digoxin -hydroxyurea -leucovorin -levamisole -medicines for seizures like ethotoin, fosphenytoin, phenytoin -medicines to increase blood counts like filgrastim, pegfilgrastim, sargramostim -medicines that treat or prevent blood clots like warfarin, enoxaparin, and dalteparin -methotrexate -metronidazole -pyrimethamine -some other chemotherapy drugs like busulfan, cisplatin, estramustine, vinblastine -trimethoprim -trimetrexate -vaccines Talk to your doctor or health care professional before taking any of these medicines: -acetaminophen -aspirin -ibuprofen -ketoprofen -naproxen This list may not describe all possible interactions. Give your health care provider a list of all the medicines, herbs, non-prescription drugs, or dietary supplements you use. Also tell them if you smoke, drink alcohol, or use illegal drugs. Some items may interact with your medicine. What should I watch for while using this medicine? Visit your doctor for checks on your progress. This drug may make you feel generally unwell. This is not uncommon, as  chemotherapy can affect  healthy cells as well as cancer cells. Report any side effects. Continue your course of treatment even though you feel ill unless your doctor tells you to stop. In some cases, you may be given additional medicines to help with side effects. Follow all directions for their use. Call your doctor or health care professional for advice if you get a fever, chills or sore throat, or other symptoms of a cold or flu. Do not treat yourself. This drug decreases your body's ability to fight infections. Try to avoid being around people who are sick. This medicine may increase your risk to bruise or bleed. Call your doctor or health care professional if you notice any unusual bleeding. Be careful brushing and flossing your teeth or using a toothpick because you may get an infection or bleed more easily. If you have any dental work done, tell your dentist you are receiving this medicine. Avoid taking products that contain aspirin, acetaminophen, ibuprofen, naproxen, or ketoprofen unless instructed by your doctor. These medicines may hide a fever. Do not become pregnant while taking this medicine. Women should inform their doctor if they wish to become pregnant or think they might be pregnant. There is a potential for serious side effects to an unborn child. Talk to your health care professional or pharmacist for more information. Do not breast-feed an infant while taking this medicine. Men should inform their doctor if they wish to father a child. This medicine may lower sperm counts. Do not treat diarrhea with over the counter products. Contact your doctor if you have diarrhea that lasts more than 2 days or if it is severe and watery. This medicine can make you more sensitive to the sun. Keep out of the sun. If you cannot avoid being in the sun, wear protective clothing and use sunscreen. Do not use sun lamps or tanning beds/booths. What side effects may I notice from receiving this  medicine? Side effects that you should report to your doctor or health care professional as soon as possible: -allergic reactions like skin rash, itching or hives, swelling of the face, lips, or tongue -low blood counts - this medicine may decrease the number of white blood cells, red blood cells and platelets. You may be at increased risk for infections and bleeding. -signs of infection - fever or chills, cough, sore throat, pain or difficulty passing urine -signs of decreased platelets or bleeding - bruising, pinpoint red spots on the skin, black, tarry stools, blood in the urine -signs of decreased red blood cells - unusually weak or tired, fainting spells, lightheadedness -breathing problems -changes in vision -chest pain -mouth sores -nausea and vomiting -pain, swelling, redness at site where injected -pain, tingling, numbness in the hands or feet -redness, swelling, or sores on hands or feet -stomach pain -unusual bleeding Side effects that usually do not require medical attention (report to your doctor or health care professional if they continue or are bothersome): -changes in finger or toe nails -diarrhea -dry or itchy skin -hair loss -headache -loss of appetite -sensitivity of eyes to the light -stomach upset -unusually teary eyes This list may not describe all possible side effects. Call your doctor for medical advice about side effects. You may report side effects to FDA at 1-800-FDA-1088. Where should I keep my medicine? This drug is given in a hospital or clinic and will not be stored at home. NOTE: This sheet is a summary. It may not cover all possible information. If you have questions about this medicine, talk  to your doctor, pharmacist, or health care provider.  2018 Else[employer]/Gold Standard (2007-07-17 13:53:16)  Oxaliplatin Injection What is this medicine? OXALIPLATIN (ox AL i PLA tin) is a chemotherapy drug. It targets fast dividing cells, like cancer cells, and  causes these cells to die. This medicine is used to treat cancers of the colon and rectum, and many other cancers. This medicine may be used for other purposes; ask your health care provider or pharmacist if you have questions. COMMON BRAND NAME(S): Eloxatin What should I tell my health care provider before I take this medicine? They need to know if you have any of these conditions: -kidney disease -an unusual or allergic reaction to oxaliplatin, other chemotherapy, other medicines, foods, dyes, or preservatives -pregnant or trying to get pregnant -breast-feeding How should I use this medicine? This drug is given as an infusion into a vein. It is administered in a hospital or clinic by a specially trained health care professional. Talk to your pediatrician regarding the use of this medicine in children. Special care may be needed. Overdosage: If you think you have taken too much of this medicine contact a poison control center or emergency room at once. NOTE: This medicine is only for you. Do not share this medicine with others. What if I miss a dose? It is important not to miss a dose. Call your doctor or health care professional if you are unable to keep an appointment. What may interact with this medicine? -medicines to increase blood counts like filgrastim, pegfilgrastim, sargramostim -probenecid -some antibiotics like amikacin, gentamicin, neomycin, polymyxin B, streptomycin, tobramycin -zalcitabine Talk to your doctor or health care professional before taking any of these medicines: -acetaminophen -aspirin -ibuprofen -ketoprofen -naproxen This list may not describe all possible interactions. Give your health care provider a list of all the medicines, herbs, non-prescription drugs, or dietary supplements you use. Also tell them if you smoke, drink alcohol, or use illegal drugs. Some items may interact with your medicine. What should I watch for while using this medicine? Your  condition will be monitored carefully while you are receiving this medicine. You will need important blood work done while you are taking this medicine. This medicine can make you more sensitive to cold. Do not drink cold drinks or use ice. Cover exposed skin before coming in contact with cold temperatures or cold objects. When out in cold weather wear warm clothing and cover your mouth and nose to warm the air that goes into your lungs. Tell your doctor if you get sensitive to the cold. This drug may make you feel generally unwell. This is not uncommon, as chemotherapy can affect healthy cells as well as cancer cells. Report any side effects. Continue your course of treatment even though you feel ill unless your doctor tells you to stop. In some cases, you may be given additional medicines to help with side effects. Follow all directions for their use. Call your doctor or health care professional for advice if you get a fever, chills or sore throat, or other symptoms of a cold or flu. Do not treat yourself. This drug decreases your body's ability to fight infections. Try to avoid being around people who are sick. This medicine may increase your risk to bruise or bleed. Call your doctor or health care professional if you notice any unusual bleeding. Be careful brushing and flossing your teeth or using a toothpick because you may get an infection or bleed more easily. If you have any dental work done, tell  your dentist you are receiving this medicine. Avoid taking products that contain aspirin, acetaminophen, ibuprofen, naproxen, or ketoprofen unless instructed by your doctor. These medicines may hide a fever. Do not become pregnant while taking this medicine. Women should inform their doctor if they wish to become pregnant or think they might be pregnant. There is a potential for serious side effects to an unborn child. Talk to your health care professional or pharmacist for more information. Do not  breast-feed an infant while taking this medicine. Call your doctor or health care professional if you get diarrhea. Do not treat yourself. What side effects may I notice from receiving this medicine? Side effects that you should report to your doctor or health care professional as soon as possible: -allergic reactions like skin rash, itching or hives, swelling of the face, lips, or tongue -low blood counts - This drug may decrease the number of white blood cells, red blood cells and platelets. You may be at increased risk for infections and bleeding. -signs of infection - fever or chills, cough, sore throat, pain or difficulty passing urine -signs of decreased platelets or bleeding - bruising, pinpoint red spots on the skin, black, tarry stools, nosebleeds -signs of decreased red blood cells - unusually weak or tired, fainting spells, lightheadedness -breathing problems -chest pain, pressure -cough -diarrhea -jaw tightness -mouth sores -nausea and vomiting -pain, swelling, redness or irritation at the injection site -pain, tingling, numbness in the hands or feet -problems with balance, talking, walking -redness, blistering, peeling or loosening of the skin, including inside the mouth -trouble passing urine or change in the amount of urine Side effects that usually do not require medical attention (report to your doctor or health care professional if they continue or are bothersome): -changes in vision -constipation -hair loss -loss of appetite -metallic taste in the mouth or changes in taste -stomach pain This list may not describe all possible side effects. Call your doctor for medical advice about side effects. You may report side effects to FDA at 1-800-FDA-1088. Where should I keep my medicine? This drug is given in a hospital or clinic and will not be stored at home. NOTE: This sheet is a summary. It may not cover all possible information. If you have questions about this  medicine, talk to your doctor, pharmacist, or health care provider.  2018 [employer]/Gold Standard (2007-10-08 17:22:47)  Irinotecan injection What is this medicine? IRINOTECAN (ir in oh TEE kan ) is a chemotherapy drug. It is used to treat colon and rectal cancer. This medicine may be used for other purposes; ask your health care provider or pharmacist if you have questions. COMMON BRAND NAME(S): Camptosar What should I tell my health care provider before I take this medicine? They need to know if you have any of these conditions: -blood disorders -dehydration -diarrhea -infection (especially a virus infection such as chickenpox, cold sores, or herpes) -liver disease -low blood counts, like low white cell, platelet, or red cell counts -recent or ongoing radiation therapy -an unusual or allergic reaction to irinotecan, sorbitol, other chemotherapy, other medicines, foods, dyes, or preservatives -pregnant or trying to get pregnant -breast-feeding How should I use this medicine? This drug is given as an infusion into a vein. It is administered in a hospital or clinic by a specially trained health care professional. Talk to your pediatrician regarding the use of this medicine in children. Special care may be needed. Overdosage: If you think you have taken too much of this medicine contact a  poison control center or emergency room at once. NOTE: This medicine is only for you. Do not share this medicine with others. What if I miss a dose? It is important not to miss your dose. Call your doctor or health care professional if you are unable to keep an appointment. What may interact with this medicine? Do not take this medicine with any of the following medications: -atazanavir -certain medicines for fungal infections like itraconazole and ketoconazole -St. John's Wort This medicine may also interact with the following medications: -dexamethasone -diuretics -laxatives -medicines for  seizures like carbamazepine, mephobarbital, phenobarbital, phenytoin, primidone -medicines to increase blood counts like filgrastim, pegfilgrastim, sargramostim -prochlorperazine -vaccines This list may not describe all possible interactions. Give your health care provider a list of all the medicines, herbs, non-prescription drugs, or dietary supplements you use. Also tell them if you smoke, drink alcohol, or use illegal drugs. Some items may interact with your medicine. What should I watch for while using this medicine? Your condition will be monitored carefully while you are receiving this medicine. You will need important blood work done while you are taking this medicine. This drug may make you feel generally unwell. This is not uncommon, as chemotherapy can affect healthy cells as well as cancer cells. Report any side effects. Continue your course of treatment even though you feel ill unless your doctor tells you to stop. In some cases, you may be given additional medicines to help with side effects. Follow all directions for their use. You may get drowsy or dizzy. Do not drive, use machinery, or do anything that needs mental alertness until you know how this medicine affects you. Do not stand or sit up quickly, especially if you are an older patient. This reduces the risk of dizzy or fainting spells. Call your doctor or health care professional for advice if you get a fever, chills or sore throat, or other symptoms of a cold or flu. Do not treat yourself. This drug decreases your body's ability to fight infections. Try to avoid being around people who are sick. This medicine may increase your risk to bruise or bleed. Call your doctor or health care professional if you notice any unusual bleeding. Be careful brushing and flossing your teeth or using a toothpick because you may get an infection or bleed more easily. If you have any dental work done, tell your dentist you are receiving this  medicine. Avoid taking products that contain aspirin, acetaminophen, ibuprofen, naproxen, or ketoprofen unless instructed by your doctor. These medicines may hide a fever. Do not become pregnant while taking this medicine. Women should inform their doctor if they wish to become pregnant or think they might be pregnant. There is a potential for serious side effects to an unborn child. Talk to your health care professional or pharmacist for more information. Do not breast-feed an infant while taking this medicine. What side effects may I notice from receiving this medicine? Side effects that you should report to your doctor or health care professional as soon as possible: -allergic reactions like skin rash, itching or hives, swelling of the face, lips, or tongue -low blood counts - this medicine may decrease the number of white blood cells, red blood cells and platelets. You may be at increased risk for infections and bleeding. -signs of infection - fever or chills, cough, sore throat, pain or difficulty passing urine -signs of decreased platelets or bleeding - bruising, pinpoint red spots on the skin, black, tarry stools, blood in the  urine -signs of decreased red blood cells - unusually weak or tired, fainting spells, lightheadedness -breathing problems -chest pain -diarrhea -feeling faint or lightheaded, falls -flushing, runny nose, sweating during infusion -mouth sores or pain -pain, swelling, redness or irritation where injected -pain, swelling, warmth in the leg -pain, tingling, numbness in the hands or feet -problems with balance, talking, walking -stomach cramps, pain -trouble passing urine or change in the amount of urine -vomiting as to be unable to hold down drinks or food -yellowing of the eyes or skin Side effects that usually do not require medical attention (report to your doctor or health care professional if they continue or are bothersome): -constipation -hair  loss -headache -loss of appetite -nausea, vomiting -stomach upset This list may not describe all possible side effects. Call your doctor for medical advice about side effects. You may report side effects to FDA at 1-800-FDA-1088. Where should I keep my medicine? This drug is given in a hospital or clinic and will not be stored at home. NOTE: This sheet is a summary. It may not cover all possible information. If you have questions about this medicine, talk to your doctor, pharmacist, or health care provider.  2018 [employer]/Gold Standard (2012-09-09 16:29:32) Leucovorin injection What is this medicine? LEUCOVORIN (loo koe VOR in) is used to prevent or treat the harmful effects of some medicines. This medicine is used to treat anemia caused by a low amount of folic acid in the body. It is also used with 5-fluorouracil (5-FU) to treat colon cancer. This medicine may be used for other purposes; ask your health care provider or pharmacist if you have questions. What should I tell my health care provider before I take this medicine? They need to know if you have any of these conditions: -anemia from low levels of vitamin B-12 in the blood -an unusual or allergic reaction to leucovorin, folic acid, other medicines, foods, dyes, or preservatives -pregnant or trying to get pregnant -breast-feeding How should I use this medicine? This medicine is for injection into a muscle or into a vein. It is given by a health care professional in a hospital or clinic setting. Talk to your pediatrician regarding the use of this medicine in children. Special care may be needed. Overdosage: If you think you have taken too much of this medicine contact a poison control center or emergency room at once. NOTE: This medicine is only for you. Do not share this medicine with others. What if I miss a dose? This does not apply. What may interact with this  medicine? -capecitabine -fluorouracil -phenobarbital -phenytoin -primidone -trimethoprim-sulfamethoxazole This list may not describe all possible interactions. Give your health care provider a list of all the medicines, herbs, non-prescription drugs, or dietary supplements you use. Also tell them if you smoke, drink alcohol, or use illegal drugs. Some items may interact with your medicine. What should I watch for while using this medicine? Your condition will be monitored carefully while you are receiving this medicine. This medicine may increase the side effects of 5-fluorouracil, 5-FU. Tell your doctor or health care professional if you have diarrhea or mouth sores that do not get better or that get worse. What side effects may I notice from receiving this medicine? Side effects that you should report to your doctor or health care professional as soon as possible: -allergic reactions like skin rash, itching or hives, swelling of the face, lips, or tongue -breathing problems -fever, infection -mouth sores -unusual bleeding or bruising -unusually weak or  tired Side effects that usually do not require medical attention (report to your doctor or health care professional if they continue or are bothersome): -constipation or diarrhea -loss of appetite -nausea, vomiting This list may not describe all possible side effects. Call your doctor for medical advice about side effects. You may report side effects to FDA at 1-800-FDA-1088. Where should I keep my medicine? This drug is given in a hospital or clinic and will not be stored at home. NOTE: This sheet is a summary. It may not cover all possible information. If you have questions about this medicine, talk to your doctor, pharmacist, or health care provider.  2018 [employer]/Gold Standard (2007-09-17 16:50:29)

## 2017-03-21 NOTE — Progress Notes (Signed)
[facility]pt[facility] Management [facility] Progress Note   Heather Frederick [id number] [birth year] 61 y.o.  Heather Frederick is managed by Heather Frederick  Actively treated with chemotherapy: yes  Current Therapy: 5-FU, irinotecan, leucovorin, and oxaliplatin  Last Treated: 12 / 26 / 2018  Assessment: Plan:    Diaphoresis  Pancreatic carcinoma metastatic to liver [facility])   Pancreatic cancer with metastatic disease to the liver: Patient presents to the office today for cycle 4-day 1 of 5-FU, irinotecan, leucovorin, and oxaliplatin.  Diaphoresis: The patient developed diaphoresis after around one quarter of her oxaliplatin was dosed.  The patient was given Benadryl 25 mg IV and Solu-Medrol 125 mg IV.  Despite this she continued to have mild diaphoresis.  Heather Frederick was seen in the infusion room for a suspected chemotherapy reaction. She was receiving oxaliplatin at the time of her reaction. She had received a total of one quarter of her total dose prior to onset of symptoms. Her symptoms included: Diaphoresis.  Additionally patient also had some mild erythema superior to her right chest wall port after it was accessed. She was premedicated with Aloxi and atropine prior to starting chemotherapy. Oxaliplatin was paused and Heather Frederick was given Benadryl 25 mg IV and Solu-Medrol 125 mg IV after onset of her symptoms. Heather Frederick did not respond to intervention.  She continued to have mild diaphoresis, but had no chest tightness, shortness of breath, difficulty swallowing, or chest pain.  This case was discussed with Heather Frederick.  No additional orders were given.   Please see After Visit Summary for patient specific instructions.  Future Appointments  Date Time Provider [place]  03/21/2017  2:15 PM Harle Stanford., PA-C [facility] None  03/23/2017  8:00 AM [facility] I25 DNS [facility] None  03/23/2017  9:00 AM Karie Mainland, RD [facility] None  03/28/2017 11:15 AM Owens Shark, NP [facility] None  04/09/2017  9:30 AM [facility] LAB 4 [facility] None  04/09/2017 10:15 AM Owens Shark, NP [facility] None  04/09/2017 11:15 AM [facility] J32 DNS [facility] None  04/11/2017  1:30 PM [facility] FLUSH NURSE [facility] None  06/28/2017  9:15 AM Dennie Bible, NP [facility] None    No orders of the defined types were placed in this encounter.      Subjective:   Patient ID:  Heather Frederick is a 61 y.o. (DOB Aug 06, 1955) female.  Chief Complaint: No chief complaint on file.   HPI [name] was seen in the infusion room for a suspected chemotherapy reaction. She was receiving oxaliplatin at the time of her reaction. She had received a total of one quarter of her total dose prior to onset of symptoms. Her symptoms included: Diaphoresis.  Additionally patient also had some mild erythema superior to her right chest wall port after it was accessed. She was premedicated with Aloxi and atropine prior to starting chemotherapy. Oxaliplatin was paused and Heather Frederick was given Benadryl 25 mg IV and Solu-Medrol 125 mg IV after onset of her symptoms. Heather Frederick did not respond to intervention.  She continued to have mild diaphoresis, but no chest pain, chest tightness, shortness of breath, or difficulty swallowing.  Medications: I have reviewed the patient's current medications.  Allergies:  Allergies  Allergen Reactions  . Topamax [Topiramate] Other (See Comments)    :Stroke like symptoms  . Aleve [Naproxen Sodium] Hives    Has tolerated Voltaren topical as well as aspirin.  Marland Kitchen Bee  Venom Swelling  . Echinacea Hives  . Other Other (See Comments)    Feathers cause sinus congestion  . Sulfa Antibiotics Hives  . Advil [Ibuprofen] Hives    Has tolerated Voltaren topical as well as aspirin.    Past Medical History:  Diagnosis Date  . Allergy   . Anxiety   . Arthritis   . Benign essential HTN 09/23/2014  . Cancer ([place])   . Chronic kidney disease     uti  . Depression   . Dyslipidemia   . Elevated liver enzymes   . Family history of anesthesia complication    father has a severe hard time waking up  . Gallstones    a. Seen on CT 01/2014.  Marland Kitchen GERD (gastroesophageal reflux disease)   . Hard of hearing   . Hepatic steatosis   . History of frequent urinary tract infections   . Hyperlipidemia   . Hypertension   . Lateral epicondylitis of right elbow   . Mental disorder   . Meralgia paresthetica of right side 10/02/2012   slight at 05/2014  . Migraine headache   . Obesity   . OSA (obstructive sleep apnea)    severe with AHI 37/hr now on CPAP at 18cm H2O  . Osteoarthritis   . Pneumonia    Feb 2018  . Raynaud disease    in feet per patient   . Sepsis ([place]) 02/23/2017  . Sleep apnea    wears c-pap  . Urinary tract infection     Past Surgical History:  Procedure Laterality Date  . ABDOMINAL HYSTERECTOMY  1/04   partial  . BLADDER SUSPENSION  6/10  . CARDIAC CATHETERIZATION    . carpel tunnel left/right  7/08, 8/08 Bilateral 8/08 and 7/08  . carpel tunnel rel Right 4/12  . CHOLECYSTECTOMY N/A 06/11/2014   Procedure: LAPAROSCOPIC CHOLECYSTECTOMY WITH ATTEMPTED INTRAOPERATIVE CHOLANGIOGRAM;  Surgeon: Jackolyn Confer, MD;  Location: [facility];  Service: General;  Laterality: N/A;  . COLONOSCOPY    . ERCP N/A 10/19/2014   Procedure: ENDOSCOPIC RETROGRADE CHOLANGIOPANCREATOGRAPHY (ERCP);  Surgeon: Ladene Artist, MD;  Location: [facility];  Service: Endoscopy;  Laterality: N/A;  . INCISIONAL HERNIA REPAIR N/A 06/30/2016   Procedure: LAPAROSCOPIC REPAIR OF INCISIONAL HERNIA WITH MESH;  Surgeon: Jackolyn Confer, MD;  Location: [facility];  Service: General;  Laterality: N/A;  . INSERTION OF MESH N/A 06/30/2016   Procedure: INSERTION OF MESH;  Surgeon: Jackolyn Confer, MD;  Location: [facility];  Service: General;  Laterality: N/A;  . IR CV LINE INJECTION  11/24/2016  . IR FLUORO GUIDE PORT INSERTION RIGHT  08/28/2016  . IR US GUIDE VASC ACCESS  RIGHT  08/28/2016  . JOINT REPLACEMENT    . KNEE ARTHROSCOPY Left 12/12  . KNEE ARTHROSCOPY Right 12/06  . LEFT HEART CATHETERIZATION WITH CORONARY ANGIOGRAM N/A 03/10/2014   Procedure: LEFT HEART CATHETERIZATION WITH CORONARY ANGIOGRAM;  Surgeon: Sinclair Grooms, MD;  Location: [facility] C[facility] LAB;  Service: Cardiovascular;  Laterality: N/A;  . PLANTAR FASCIA RELEASE Right 12/10  . radial tunnel release     right arm   . ROTATOR CUFF REPAIR Left 6/11  . tennis elbow release Right 7/04  . TOTAL KNEE ARTHROPLASTY Left 09/10/2012   Procedure: TOTAL KNEE ARTHROPLASTY- left;  Surgeon: Garald Balding, MD;  Location: [place];  Service: Orthopedics;  Laterality: Left;  Left total knee arthroplasty    Family History  Problem Relation Age of Onset  . Hypertension Mother   .  Stroke Mother   . Liver disease Mother        Abcess  . Hypertension Father   . Coronary artery disease Father   . Migraines Father   . Clotting disorder Father   . Kidney failure Brother   . Hypertension Brother   . Migraines Brother   . Migraines Daughter   . Breast cancer Other        Niece with breast cancer  . Colon cancer Paternal Grandmother   . Pancreatic cancer Paternal Grandmother   . Stomach cancer Paternal Grandmother   . Breast cancer Cousin   . Esophageal cancer Neg Hx   . Rectal cancer Neg Hx     Social History   Socioeconomic History  . Marital status: Married    Spouse name: Remo Lipps  . Number of children: 2  . Years of education: 61  . Highest education level: Not on file  Social Needs  . Financial resource strain: Not on file  . Food insecurity - worry: Not on file  . Food insecurity - inability: Not on file  . Transportation needs - medical: Not on file  . Transportation needs - non-medical: Not on file  Occupational History  . Occupation: Geneticist, molecular: Psychologist, sport and exercise [employer]  Tobacco Use  . Smoking status: Never Smoker  . Smokeless tobacco: Never Used  . Tobacco comment:  Secondhand - from family growing up, workplace intermittently  Substance and Sexual Activity  . Alcohol use: Yes    Alcohol/week: 0.0 oz    Comment: occasionally - intermittent, no more than twice a week  . Drug use: No  . Sexual activity: Not on file  Other Topics Concern  . Not on file  Social History Narrative   Patient is married Remo Lipps) and lives at home with her husband and child.   Patient has two children.   Patient has a college education.   Patient is right-handed.   Patient drinks 1-2 cup of coffee/tea daily.    Past Medical History, Surgical history, Social history, and Family history were reviewed and updated as appropriate.   Please see review of systems for further details on the patient's review from today.   Review of Systems:  Review of Systems  Constitutional: Positive for diaphoresis. Negative for chills and fever.  HENT: Negative for trouble swallowing.   Respiratory: Negative for choking, chest tightness and shortness of breath.   Cardiovascular: Negative for chest pain and palpitations.    Objective:   Physical Exam:  There were no vitals taken for this visit. ECOG: 0  Physical Exam  Constitutional: No distress.  HENT:  Head: Normocephalic and atraumatic.  Cardiovascular: Normal rate, regular rhythm and normal heart sounds. Exam reveals no gallop and no friction rub.  No murmur heard. Pulmonary/Chest: Effort normal and breath sounds normal. No respiratory distress. She has no wheezes. She has no rales.  Neurological: She is alert.  Skin: Skin is warm. She is diaphoretic.  Skin is mildly diaphoretic.  There is mild erythema immediately superior to the left chest port without increased warmth, edema, or exudate.  This area of erythema was rechecked around 1 hour after the patient was initially seen.  The area has nearly resolved.    Lab Review:     Component Value Date/Time   NA 137 03/19/2017 1422   K 3.4 (L) 03/19/2017 1422   CL 103  03/13/2017 0459   CO2 24 03/19/2017 1422   GLUCOSE 158 (H) 03/19/2017 1422  BUN 6.2 (L) 03/19/2017 1422   CREATININE 0.8 03/19/2017 1422   CALCIUM 8.8 03/19/2017 1422   PROT 6.6 03/19/2017 1422   ALBUMIN 2.7 (L) 03/19/2017 1422   AST 24 03/19/2017 1422   ALT 20 03/19/2017 1422   ALKPHOS 150 03/19/2017 1422   BILITOT 0.42 03/19/2017 1422   GFRNONAA >60 03/13/2017 0459   GFRAA >60 03/13/2017 0459       Component Value Date/Time   WBC 3.8 (L) 03/19/2017 1422   WBC 5.1 03/13/2017 0459   RBC 3.49 (L) 03/19/2017 1422   RBC 3.45 (L) 03/13/2017 0459   HGB 9.4 (L) 03/19/2017 1422   HCT 30.3 (L) 03/19/2017 1422   PLT 179 03/19/2017 1422   MCV 86.8 03/19/2017 1422   MCH 26.9 03/19/2017 1422   MCH 27.8 03/13/2017 0459   MCHC 31.0 (L) 03/19/2017 1422   MCHC 32.7 03/13/2017 0459   RDW 16.3 (H) 03/19/2017 1422   LYMPHSABS 1.2 03/19/2017 1422   MONOABS 0.9 03/19/2017 1422   EOSABS 0.3 03/19/2017 1422   BASOSABS 0.0 03/19/2017 1422   -------------------------------  Imaging from last 24 hours (if applicable):  Radiology interpretation: Ct Angio Chest Pe W And/or Wo Contrast  Result Date: 03/11/2017 CLINICAL DATA:  61 year old female with pancreatic cancer undergoing treatment. Abdominal pain and nausea since yesterday. Chest pain and shortness of breath. EXAM: CT ANGIOGRAPHY CHEST CT ABDOMEN AND PELVIS WITH CONTRAST TECHNIQUE: Multidetector CT imaging of the chest was performed using the standard protocol during bolus administration of intravenous contrast. Multiplanar CT image reconstructions and MIPs were obtained to evaluate the vascular anatomy. Multidetector CT imaging of the abdomen and pelvis was performed using the standard protocol during bolus administration of intravenous contrast. CONTRAST:  153mL ISOVUE-370 IOPAMIDOL (ISOVUE-370) INJECTION 76% COMPARISON:  CT Abdomen and Pelvis 01/01/2017.  Chest CT 12/06/2016. FINDINGS: CTA CHEST FINDINGS Cardiovascular: Good contrast bolus  timing in the pulmonary arterial tree. Mild to moderate respiratory motion. No convincing pulmonary artery filling defect to suggest acute pulmonary embolus. No pericardial effusion. Negative thoracic aorta. Right chest IJ approach porta cath in place. Mediastinum/Nodes: No mediastinal lymphadenopathy. Lungs/Pleura: Chronic elevation of the right hemidiaphragm. Lower lung volumes compared to the September chest CT. Increased right upper lobe pulmonary nodule since September on series 14, image 41, now 8 mm (previously 4 mm). Small lower lobe subpleural nodules appear more stable. There is dependent atelectasis. There is confluent right lower lobe atelectasis. No pleural effusion. Musculoskeletal: No acute or suspicious osseous lesion identified in the chest. Review of the MIP images confirms the above findings. CT ABDOMEN and PELVIS FINDINGS Hepatobiliary: Progressed liver metastatic disease since October. Numerous larger and more discrete hypodense, rim enhancing liver masses individually measuring up to 3.5-4 cm diameter. Surgically absent gallbladder. Pancreas: Heterogeneously enhancing body pancreatic mass appears larger than in October. Today this is 45 x 47 by 43 mm (AP by transverse by CC) versus 28 x 49 by about 31 mm previously. There is atrophy and ductal dilatation in the pancreatic tail. There is mild peripancreatic fat stranding. Small peripancreatic lymph nodes appear stable. No vascular occlusion about the mass. Spleen: Negative. Adrenals/Urinary Tract: Adrenal glands remain within normal limits. Bilateral renal enhancement and contrast excretion is within normal limits. Unremarkable urinary bladder. Stomach/Bowel: Mild retained stool in the distal colon. Redundant sigmoid tracking into the left mid abdomen. Mild redundancy of the transverse colon. No large bowel inflammation. Negative appendix. No dilated or inflamed small bowel. There is a 5 cm small bowel diverticulum suspected in  the proximal  jejunum (series 4, image 39) which was smaller and less apparent in October (on series 3, image 69 of that study). No surrounding inflammation. Prior ventral abdominal hernia repair with mesh. Decompressed stomach. 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 The duodenum appears stable and otherwise within normal limits. No abdominal free fluid. Vascular/Lymphatic: Major arterial structures in the abdomen and pelvis are patent. Portal venous system is patent. Outside of the peripancreatic region there is no abdominal or pelvic lymphadenopathy. Reproductive: Surgically absent uterus.  Negative ovaries. Other: No pelvic free fluid. Musculoskeletal: No acute or suspicious osseous lesion identified. Review of the MIP images confirms the above findings. IMPRESSION: 1.  Negative for acute pulmonary embolus. 2. Progressed primary pancreatic tumor and liver metastases since 01/01/2017. 3. Increased solitary right upper lobe lung nodule since September suspicious for pulmonary oligometastatic disease. 4. Lower lung volumes with atelectasis.  No pleural effusion. 5. No bowel obstruction or other acute findings identified. Electronically Signed   By: Genevie Ann M.D.   On: 03/11/2017 14:08   Ct Abdomen Pelvis W Contrast  Result Date: 03/11/2017 CLINICAL DATA:  61 year old female with pancreatic cancer undergoing treatment. Abdominal pain and nausea since yesterday. Chest pain and shortness of breath. EXAM: CT ANGIOGRAPHY CHEST CT ABDOMEN AND PELVIS WITH CONTRAST TECHNIQUE: Multidetector CT imaging of the chest was performed using the standard protocol during bolus administration of intravenous contrast. Multiplanar CT image reconstructions and MIPs were obtained to evaluate the vascular anatomy. Multidetector CT imaging of the abdomen and pelvis was performed using the standard protocol during bolus administration of intravenous contrast. CONTRAST:  125mL ISOVUE-370 IOPAMIDOL (ISOVUE-370) INJECTION 76% COMPARISON:  CT Abdomen  and Pelvis 01/01/2017.  Chest CT 12/06/2016. FINDINGS: CTA CHEST FINDINGS Cardiovascular: Good contrast bolus timing in the pulmonary arterial tree. Mild to moderate respiratory motion. No convincing pulmonary artery filling defect to suggest acute pulmonary embolus. No pericardial effusion. Negative thoracic aorta. Right chest IJ approach porta cath in place. Mediastinum/Nodes: No mediastinal lymphadenopathy. Lungs/Pleura: Chronic elevation of the right hemidiaphragm. Lower lung volumes compared to the September chest CT. Increased right upper lobe pulmonary nodule since September on series 14, image 41, now 8 mm (previously 4 mm). Small lower lobe subpleural nodules appear more stable. There is dependent atelectasis. There is confluent right lower lobe atelectasis. No pleural effusion. Musculoskeletal: No acute or suspicious osseous lesion identified in the chest. Review of the MIP images confirms the above findings. CT ABDOMEN and PELVIS FINDINGS Hepatobiliary: Progressed liver metastatic disease since October. Numerous larger and more discrete hypodense, rim enhancing liver masses individually measuring up to 3.5-4 cm diameter. Surgically absent gallbladder. Pancreas: Heterogeneously enhancing body pancreatic mass appears larger than in October. Today this is 45 x 47 by 43 mm (AP by transverse by CC) versus 28 x 49 by about 31 mm previously. There is atrophy and ductal dilatation in the pancreatic tail. There is mild peripancreatic fat stranding. Small peripancreatic lymph nodes appear stable. No vascular occlusion about the mass. Spleen: Negative. Adrenals/Urinary Tract: Adrenal glands remain within normal limits. Bilateral renal enhancement and contrast excretion is within normal limits. Unremarkable urinary bladder. Stomach/Bowel: Mild retained stool in the distal colon. Redundant sigmoid tracking into the left mid abdomen. Mild redundancy of the transverse colon. No large bowel inflammation. Negative  appendix. No dilated or inflamed small bowel. There is a 5 cm small bowel diverticulum suspected in the proximal jejunum (series 4, image 39) which was smaller and less apparent in October (on series 3, image 69  of that study). No surrounding inflammation. Prior ventral abdominal hernia repair with mesh. Decompressed stomach. 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 The duodenum appears stable and otherwise within normal limits. No abdominal free fluid. Vascular/Lymphatic: Major arterial structures in the abdomen and pelvis are patent. Portal venous system is patent. Outside of the peripancreatic region there is no abdominal or pelvic lymphadenopathy. Reproductive: Surgically absent uterus.  Negative ovaries. Other: No pelvic free fluid. Musculoskeletal: No acute or suspicious osseous lesion identified. Review of the MIP images confirms the above findings. IMPRESSION: 1.  Negative for acute pulmonary embolus. 2. Progressed primary pancreatic tumor and liver metastases since 01/01/2017. 3. Increased solitary right upper lobe lung nodule since September suspicious for pulmonary oligometastatic disease. 4. Lower lung volumes with atelectasis.  No pleural effusion. 5. No bowel obstruction or other acute findings identified. Electronically Signed   By: Genevie Ann M.D.   On: 03/11/2017 14:08   Dg Chest Port 1 View  Result Date: 02/23/2017 CLINICAL DATA:  Shortness of breath. Weakness. History of pancreatic cancer on active chemotherapy. EXAM: PORTABLE CHEST 1 VIEW COMPARISON:  Radiograph 02/05/2017, CT 12/06/2016 FINDINGS: Right chest port with tip in the mid SVC. Lower lung volumes from prior exam. Unchanged heart size and mediastinal contours allowing for differences in technique. Streaky left basilar atelectasis. No confluent consolidation. Small pulmonary nodule in the right lung on prior exam is not seen currently, may be obscured. No pulmonary edema, large pleural effusion or pneumothorax. IMPRESSION: Low lung  volumes with streaky left basilar atelectasis. Electronically Signed   By: Jeb Levering M.D.   On: 02/23/2017 02:13        This case was discussed with Heather Frederick. He expressed agreement with my management of this patient.

## 2017-03-21 NOTE — Progress Notes (Signed)
1020 Pt's port site noted to have redness surrounding the insertion site/needle. Pt denies discomfort or irritation. This RN asked Amy Elkins,RN to observe since she accessed pt to see if the redness was more than when accessed. Amy verbalizes yes. Port flushes easily and has good blood return. Pt states she wants to ambulate and move around. Oxaliplatin started. 1110 Pt reports diaphoresis. VSS. Sandi Mealy, PA notified.  [place] in to see pt. Oxaliplatin paused.  1123 25mg  IV benadryl administered 1126 125mg  IV solu-medrol administered Oxaliplatin restarted per order of [place], [place]. Pt continues to be diaphoretic. VSS. No other c/o or symptoms.

## 2017-03-23 ENCOUNTER — Encounter: Payer: Self-pay | Admitting: Nutrition

## 2017-03-23 ENCOUNTER — Encounter: Payer: 59 | Admitting: Nutrition

## 2017-03-23 ENCOUNTER — Ambulatory Visit (HOSPITAL_BASED_OUTPATIENT_CLINIC_OR_DEPARTMENT_OTHER): Payer: 59

## 2017-03-23 VITALS — BP 106/43 | HR 52 | Temp 97.7°F | Resp 18

## 2017-03-23 DIAGNOSIS — Z452 Encounter for adjustment and management of vascular access device: Secondary | ICD-10-CM

## 2017-03-23 DIAGNOSIS — Z5189 Encounter for other specified aftercare: Secondary | ICD-10-CM

## 2017-03-23 DIAGNOSIS — C787 Secondary malignant neoplasm of liver and intrahepatic bile duct: Principal | ICD-10-CM

## 2017-03-23 DIAGNOSIS — C251 Malignant neoplasm of body of pancreas: Secondary | ICD-10-CM

## 2017-03-23 DIAGNOSIS — C259 Malignant neoplasm of pancreas, unspecified: Secondary | ICD-10-CM

## 2017-03-23 MED ORDER — HEPARIN SOD (PORK) LOCK FLUSH 100 UNIT/ML IV SOLN
500.0000 [IU] | Freq: Once | INTRAVENOUS | Status: AC | PRN
  Administered 2017-03-23: 500 [IU]
  Filled 2017-03-23: qty 5

## 2017-03-23 MED ORDER — PEGFILGRASTIM INJECTION 6 MG/0.6ML ~~LOC~~
PREFILLED_SYRINGE | SUBCUTANEOUS | Status: AC
  Filled 2017-03-23: qty 0.6

## 2017-03-23 MED ORDER — SODIUM CHLORIDE 0.9% FLUSH
1000.0000 mL | Freq: Once | INTRAVENOUS | Status: AC
  Administered 2017-03-23: 1000 mL via INTRAVENOUS
  Filled 2017-03-23: qty 1000

## 2017-03-23 MED ORDER — PEGFILGRASTIM INJECTION 6 MG/0.6ML ~~LOC~~
6.0000 mg | PREFILLED_SYRINGE | Freq: Once | SUBCUTANEOUS | Status: AC
  Administered 2017-03-23: 6 mg via SUBCUTANEOUS

## 2017-03-23 MED ORDER — SODIUM CHLORIDE 0.9% FLUSH
10.0000 mL | INTRAVENOUS | Status: DC | PRN
  Administered 2017-03-23: 10 mL
  Filled 2017-03-23: qty 10

## 2017-03-23 NOTE — Progress Notes (Signed)
Patient did not show up for appointment.

## 2017-03-23 NOTE — Patient Instructions (Addendum)
Dehydration, Adult Dehydration is when there is not enough fluid or water in your body. This happens when you lose more fluids than you take in. Dehydration can range from mild to very bad. It should be treated right away to keep it from getting very bad. Symptoms of mild dehydration may include:  Thirst.  Dry lips.  Slightly dry mouth.  Dry, warm skin.  Dizziness. Symptoms of moderate dehydration may include:  Very dry mouth.  Muscle cramps.  Dark pee (urine). Pee may be the color of tea.  Your body making less pee.  Your eyes making fewer tears.  Heartbeat that is uneven or faster than normal (palpitations).  Headache.  Light-headedness, especially when you stand up from sitting.  Fainting (syncope). Symptoms of very bad dehydration may include:  Changes in skin, such as: ? Cold and clammy skin. ? Blotchy (mottled) or pale skin. ? Skin that does not quickly return to normal after being lightly pinched and let go (poor skin turgor).  Changes in body fluids, such as: ? Feeling very thirsty. ? Your eyes making fewer tears. ? Not sweating when body temperature is high, such as in hot weather. ? Your body making very little pee.  Changes in vital signs, such as: ? Weak pulse. ? Pulse that is more than 100 beats a minute when you are sitting still. ? Fast breathing. ? Low blood pressure.  Other changes, such as: ? Sunken eyes. ? Cold hands and feet. ? Confusion. ? Lack of energy (lethargy). ? Trouble waking up from sleep. ? Short-term weight loss. ? Unconsciousness. Follow these instructions at home:  If told by your doctor, drink an ORS: ? Make an ORS by using instructions on the package. ? Start by drinking small amounts, about  cup (120 mL) every 5-10 minutes. ? Slowly drink more until you have had the amount that your doctor said to have.  Drink enough clear fluid to keep your pee clear or pale yellow. If you were told to drink an ORS, finish the ORS  first, then start slowly drinking clear fluids. Drink fluids such as: ? Water. Do not drink only water by itself. Doing that can make the salt (sodium) level in your body get too low (hyponatremia). ? Ice chips. ? Fruit juice that you have added water to (diluted). ? Low-calorie sports drinks.  Avoid: ? Alcohol. ? Drinks that have a lot of sugar. These include high-calorie sports drinks, fruit juice that does not have water added, and soda. ? Caffeine. ? Foods that are greasy or have a lot of fat or sugar.  Take over-the-counter and prescription medicines only as told by your doctor.  Do not take salt tablets. Doing that can make the salt level in your body get too high (hypernatremia).  Eat foods that have minerals (electrolytes). Examples include bananas, oranges, potatoes, tomatoes, and spinach.  Keep all follow-up visits as told by your doctor. This is important. Contact a doctor if:  You have belly (abdominal) pain that: ? Gets worse. ? Stays in one area (localizes).  You have a rash.  You have a stiff neck.  You get angry or annoyed more easily than normal (irritability).  You are more sleepy than normal.  You have a harder time waking up than normal.  You feel: ? Weak. ? Dizzy. ? Very thirsty.  You have peed (urinated) only a small amount of very dark pee during 6-8 hours. Get help right away if:  You have symptoms of   very bad dehydration.  You cannot drink fluids without throwing up (vomiting).  Your symptoms get worse with treatment.  You have a fever.  You have a very bad headache.  You are throwing up or having watery poop (diarrhea) and it: ? Gets worse. ? Does not go away.  You have blood or something green (bile) in your throw-up.  You have blood in your poop (stool). This may cause poop to look black and tarry.  You have not peed in 6-8 hours.  You pass out (faint).  Your heart rate when you are sitting still is more than 100 beats a  minute.  You have trouble breathing. This information is not intended to replace advice given to you by your health care provider. Make sure you discuss any questions you have with your health care provider. Document Released: 01/07/2009 Document Revised: 10/01/2015 Document Reviewed: 05/07/2015 Elsevier Interactive Patient Education  2018 [employer]  Pegfilgrastim injection What is this medicine? PEGFILGRASTIM (PEG fil gra stim) is a long-acting granulocyte colony-stimulating factor that stimulates the growth of neutrophils, a type of white blood cell important in the body's fight against infection. It is used to reduce the incidence of fever and infection in patients with certain types of cancer who are receiving chemotherapy that affects the bone marrow, and to increase survival after being exposed to high doses of radiation. This medicine may be used for other purposes; ask your health care provider or pharmacist if you have questions. COMMON BRAND NAME(S): Neulasta What should I tell my health care provider before I take this medicine? They need to know if you have any of these conditions: -kidney disease -latex allergy -ongoing radiation therapy -sickle cell disease -skin reactions to acrylic adhesives (On-Body Injector only) -an unusual or allergic reaction to pegfilgrastim, filgrastim, other medicines, foods, dyes, or preservatives -pregnant or trying to get pregnant -breast-feeding How should I use this medicine? This medicine is for injection under the skin. If you get this medicine at home, you will be taught how to prepare and give the pre-filled syringe or how to use the On-body Injector. Refer to the patient Instructions for Use for detailed instructions. Use exactly as directed. Tell your healthcare provider immediately if you suspect that the On-body Injector may not have performed as intended or if you suspect the use of the On-body Injector resulted in a missed or partial  dose. It is important that you put your used needles and syringes in a special sharps container. Do not put them in a trash can. If you do not have a sharps container, call your pharmacist or healthcare provider to get one. Talk to your pediatrician regarding the use of this medicine in children. While this drug may be prescribed for selected conditions, precautions do apply. Overdosage: If you think you have taken too much of this medicine contact a poison control center or emergency room at once. NOTE: This medicine is only for you. Do not share this medicine with others. What if I miss a dose? It is important not to miss your dose. Call your doctor or health care professional if you miss your dose. If you miss a dose due to an On-body Injector failure or leakage, a new dose should be administered as soon as possible using a single prefilled syringe for manual use. What may interact with this medicine? Interactions have not been studied. Give your health care provider a list of all the medicines, herbs, non-prescription drugs, or dietary supplements you use.   tell them if you smoke, drink alcohol, or use illegal drugs. Some items may interact with your medicine. This list may not describe all possible interactions. Give your health care provider a list of all the medicines, herbs, non-prescription drugs, or dietary supplements you use. Also tell them if you smoke, drink alcohol, or use illegal drugs. Some items may interact with your medicine. What should I watch for while using this medicine? You may need blood work done while you are taking this medicine. If you are going to need a MRI, CT scan, or other procedure, tell your doctor that you are using this medicine (On-Body Injector only). What side effects may I notice from receiving this medicine? Side effects that you should report to your doctor or health care professional as soon as possible: -allergic reactions like skin rash, itching or  hives, swelling of the face, lips, or tongue -dizziness -fever -pain, redness, or irritation at site where injected -pinpoint red spots on the skin -red or dark-brown urine -shortness of breath or breathing problems -stomach or side pain, or pain at the shoulder -swelling -tiredness -trouble passing urine or change in the amount of urine Side effects that usually do not require medical attention (report to your doctor or health care professional if they continue or are bothersome): -bone pain -muscle pain This list may not describe all possible side effects. Call your doctor for medical advice about side effects. You may report side effects to FDA at 1-800-FDA-1088. Where should I keep my medicine? Keep out of the reach of children. Store pre-filled syringes in a refrigerator between 2 and 8 degrees C (36 and 46 degrees F). Do not freeze. Keep in carton to protect from light. Throw away this medicine if it is left out of the refrigerator for more than 48 hours. Throw away any unused medicine after the expiration date. NOTE: This sheet is a summary. It may not cover all possible information. If you have questions about this medicine, talk to your doctor, pharmacist, or health care provider.  2018 Elsevier/Gold Standard (2016-03-09 12:58:03)   Dehydration, Adult Dehydration is when there is not enough fluid or water in your body. This happens when you lose more fluids than you take in. Dehydration can range from mild to very bad. It should be treated right away to keep it from getting very bad. Symptoms of mild dehydration may include:  Thirst.  Dry lips.  Slightly dry mouth.  Dry, warm skin.  Dizziness. Symptoms of moderate dehydration may include:  Very dry mouth.  Muscle cramps.  Dark pee (urine). Pee may be the color of tea.  Your body making less pee.  Your eyes making fewer tears.  Heartbeat that is uneven or faster than normal  (palpitations).  Headache.  Light-headedness, especially when you stand up from sitting.  Fainting (syncope). Symptoms of very bad dehydration may include:  Changes in skin, such as: ? Cold and clammy skin. ? Blotchy (mottled) or pale skin. ? Skin that does not quickly return to normal after being lightly pinched and let go (poor skin turgor).  Changes in body fluids, such as: ? Feeling very thirsty. ? Your eyes making fewer tears. ? Not sweating when body temperature is high, such as in hot weather. ? Your body making very little pee.  Changes in vital signs, such as: ? Weak pulse. ? Pulse that is more than 100 beats a minute when you are sitting still. ? Fast breathing. ? Low blood pressure.  Other  changes, such as: ? Sunken eyes. ? Cold hands and feet. ? Confusion. ? Lack of energy (lethargy). ? Trouble waking up from sleep. ? Short-term weight loss. ? Unconsciousness. Follow these instructions at home:  If told by your doctor, drink an ORS: ? Make an ORS by using instructions on the package. ? Start by drinking small amounts, about  cup (120 mL) every 5-10 minutes. ? Slowly drink more until you have had the amount that your doctor said to have.  Drink enough clear fluid to keep your pee clear or pale yellow. If you were told to drink an ORS, finish the ORS first, then start slowly drinking clear fluids. Drink fluids such as: ? Water. Do not drink only water by itself. Doing that can make the salt (sodium) level in your body get too low (hyponatremia). ? Ice chips. ? Fruit juice that you have added water to (diluted). ? Low-calorie sports drinks.  Avoid: ? Alcohol. ? Drinks that have a lot of sugar. These include high-calorie sports drinks, fruit juice that does not have water added, and soda. ? Caffeine. ? Foods that are greasy or have a lot of fat or sugar.  Take over-the-counter and prescription medicines only as told by your doctor.  Do not take salt  tablets. Doing that can make the salt level in your body get too high (hypernatremia).  Eat foods that have minerals (electrolytes). Examples include bananas, oranges, potatoes, tomatoes, and spinach.  Keep all follow-up visits as told by your doctor. This is important. Contact a doctor if:  You have belly (abdominal) pain that: ? Gets worse. ? Stays in one area (localizes).  You have a rash.  You have a stiff neck.  You get angry or annoyed more easily than normal (irritability).  You are more sleepy than normal.  You have a harder time waking up than normal.  You feel: ? Weak. ? Dizzy. ? Very thirsty.  You have peed (urinated) only a small amount of very dark pee during 6-8 hours. Get help right away if:  You have symptoms of very bad dehydration.  You cannot drink fluids without throwing up (vomiting).  Your symptoms get worse with treatment.  You have a fever.  You have a very bad headache.  You are throwing up or having watery poop (diarrhea) and it: ? Gets worse. ? Does not go away.  You have blood or something green (bile) in your throw-up.  You have blood in your poop (stool). This may cause poop to look black and tarry.  You have not peed in 6-8 hours.  You pass out (faint).  Your heart rate when you are sitting still is more than 100 beats a minute.  You have trouble breathing. This information is not intended to replace advice given to you by your health care provider. Make sure you discuss any questions you have with your health care provider. Document Released: 01/07/2009 Document Revised: 10/01/2015 Document Reviewed: 05/07/2015 Elsevier Interactive Patient Education  2018 [employer].

## 2017-03-26 ENCOUNTER — Other Ambulatory Visit: Payer: Self-pay | Admitting: Oncology

## 2017-03-28 ENCOUNTER — Ambulatory Visit: Payer: 59

## 2017-03-28 ENCOUNTER — Encounter: Payer: Self-pay | Admitting: Nurse Practitioner

## 2017-03-28 ENCOUNTER — Ambulatory Visit: Payer: 59 | Admitting: Nurse Practitioner

## 2017-03-28 ENCOUNTER — Telehealth: Payer: Self-pay | Admitting: Nurse Practitioner

## 2017-03-28 ENCOUNTER — Ambulatory Visit (HOSPITAL_BASED_OUTPATIENT_CLINIC_OR_DEPARTMENT_OTHER): Payer: 59

## 2017-03-28 ENCOUNTER — Other Ambulatory Visit: Payer: 59

## 2017-03-28 ENCOUNTER — Ambulatory Visit (HOSPITAL_BASED_OUTPATIENT_CLINIC_OR_DEPARTMENT_OTHER): Payer: 59 | Admitting: Nurse Practitioner

## 2017-03-28 VITALS — BP 92/50 | HR 91 | Temp 97.4°F | Resp 19 | Ht 63.0 in | Wt 252.9 lb

## 2017-03-28 DIAGNOSIS — C787 Secondary malignant neoplasm of liver and intrahepatic bile duct: Principal | ICD-10-CM

## 2017-03-28 DIAGNOSIS — C259 Malignant neoplasm of pancreas, unspecified: Secondary | ICD-10-CM

## 2017-03-28 DIAGNOSIS — G893 Neoplasm related pain (acute) (chronic): Secondary | ICD-10-CM

## 2017-03-28 DIAGNOSIS — C251 Malignant neoplasm of body of pancreas: Secondary | ICD-10-CM | POA: Diagnosis not present

## 2017-03-28 DIAGNOSIS — E119 Type 2 diabetes mellitus without complications: Secondary | ICD-10-CM

## 2017-03-28 DIAGNOSIS — I10 Essential (primary) hypertension: Secondary | ICD-10-CM

## 2017-03-28 DIAGNOSIS — R350 Frequency of micturition: Secondary | ICD-10-CM

## 2017-03-28 LAB — URINALYSIS, MICROSCOPIC - CHCC
BILIRUBIN (URINE): NEGATIVE
GLUCOSE UR CHCC: NEGATIVE mg/dL
Ketones: NEGATIVE mg/dL
NITRITE: NEGATIVE
Protein: 30 mg/dL
Specific Gravity, Urine: 1.005 (ref 1.003–1.035)
UROBILINOGEN UR: 0.2 mg/dL (ref 0.2–1)
pH: 8.5 (ref 4.6–8.0)

## 2017-03-28 NOTE — Telephone Encounter (Signed)
Gave patient avs and calendar with appts.  °

## 2017-03-28 NOTE — Progress Notes (Addendum)
[facility] OFFICE PROGRESS NOTE   Diagnosis: Pancreas cancer  INTERVAL HISTORY:   Heather Frederick returns as scheduled.  She completed a cycle of FOLFIRINOX 03/21/2017.  She had mild nausea.  No mouth sores.  No diarrhea.  She reports becoming "sweaty" during the Oxaliplatin infusion.  She was able to complete the infusion.  She continues to have intermittent episodes of shortness of breath and "shaking".  She feels lightheaded/dizzy mainly with position change.  No unusual headaches.  No diplopia.  She reports good fluid intake.  Abdominal pain is unchanged.  She is concerned she may have another urinary tract infection.  Objective:  Vital signs in last 24 hours:  Blood pressure (!) 92/50, pulse 91, temperature (!) 97.4 F (36.3 C), temperature source Oral, resp. rate 19, height '5\' 3"'  (1.6 m), weight 252 lb 14.4 oz (114.7 kg), SpO2 99 %.    HEENT: No thrush or ulcers.  Mucous membranes appear moist.  Extraocular movements intact. Resp: Lungs clear bilaterally. Cardio: Regular rate and rhythm. GI: Abdomen is soft.  Right lateral abdomen with tenderness.  No hepatomegaly. Vascular: No leg edema. Neuro: Alert and oriented.  Moves all extremities. Port-A-Cath without erythema.  Lab Results:  Lab Results  Component Value Date   WBC 3.8 (L) 03/19/2017   HGB 9.4 (L) 03/19/2017   HCT 30.3 (L) 03/19/2017   MCV 86.8 03/19/2017   PLT 179 03/19/2017   NEUTROABS 1.4 (L) 03/19/2017    Imaging:  No results found.  Medications: I have reviewed the patient's current medications.  Assessment/Plan: 1. Metastatic pancreas cancer ? Pancreas body mass and liver metastases noted on CT abdomen/pelvis 08/11/2016 ? Ultrasound-guided biopsy of a right liver lesion 08/16/2016 revealed poorly differentiated adenocarcinoma consistent with pancreas cancer ? Foundation 1-microsatellite stable; tumor mutational burden 1; ERBB2 amplification ? Cycle 1 gemcitabine/Abraxane 09/13/2016;  09/29/2016 ? Cycle 2 gemcitabine/Abraxane 10/11/2016, 10/18/2016 ? Cycle 3 gemcitabine/Abraxane 11/01/2016, 11/08/2016 ? Cycle 4 gemcitabine/Abraxane 11/23/2016,11/29/2016 ? CT chest 12/06/2016-liver lesions appear smaller ? Cycle 5 gemcitabine/Abraxane 12/13/2016 ? CT abdomen/pelvis 01/01/2017-new and enlarging hepatic masses. Enlarging pancreatic mass. ? Cycle 6 gemcitabine/Abraxane 01/03/2017 ? Rising CA 19-9, increased pain October 2018 ? Cycle 1 FOLFOX 01/31/2017 ? Cycle 2 FOLFOX 02/19/2017 ? Cycle 3FOLFIRINOX 03/06/2017 ? CT 03/11/2017 (compared to 01/01/2017) increased liver lesions and enlargement of the pancreas mass ? Cycle 4 FOLFIRINOX 03/21/2017 (oxaliplatin dose reduced and Neulasta added)  2. Pain secondary to pancreas cancer, MS Contin added 03/13/2017  3. Hypertension  4. Sleep apnea  5. Chronic low back pain  6. Recurrent urinary tract infections  7. Depression  8. Migraine headaches  9. Port-A-Cath placement 08/28/2016  10. Rash following cycle 1 gemcitabine/Abraxane-drug rash?  11. Nausea/vomiting following cycle 1 gemcitabine/Abraxane-antiemetic regimen adjusted with addition of Aloxi  12. Diabetes  13. Right cephalic vein thrombosis [id number]/17/[id number] with Xarelto  14. CT chest 12/06/2016 done to evaluate dyspnea-several 3-4 mm nodular opacities in the lung parenchyma etiology uncertain. Known mass in the body of the pancreas measuring 3.3 x 2.4 cm;small enhancing lesion in the anterior dome of the liver measures 8 mm.  15. Right forearm rash following cycle 5 day 8 gemcitabine/Abraxane. Resolved  16.  Klebsiella urinary tract infection 03/14/2017     Disposition: Heather Frederick appears unchanged.  She has completed 4 cycles of systemic therapy, 2 with FOLFOX and the most recent 2 with FOLFIRINOX.  The plan is to proceed with another cycle of FOLFIRINOX in 1 week.  She continues to have intermittent  lightheadedness/dizziness.  This mainly occurs with position change.  Blood pressure declined slightly with position change, no change in heart rate.  We discussed potential etiologies including dehydration, vertigo, brain metastasis.  She has no other signs/symptoms to suggest brain metastasis.  If symptoms persist we will proceed with a brain CT.  She will return for labs, follow-up and FOLFIRINOX in 1 week.  She will contact the office in the interim with any problems.  We specifically discussed any neurologic symptoms.  Patient seen with Dr. Benay Spice.  25 minutes were spent face-to-face at today's visit with the majority of that time involved in counseling/coordination of care.      Ned Card ANP/GNP-BC   03/28/2017  1:07 PM  This was a shared visit with Ned Card.  Heather Frederick appears unchanged.  The etiology of her "dizziness "is unclear.  She does not appear dehydrated today.  We will obtain imaging of the CNS if the dizziness persists.  Her pain appears to be under better control while on MS Contin.  She will return for an office visit as scheduled 04/09/2016.  Julieanne Manson, MD

## 2017-03-29 LAB — URINE CULTURE

## 2017-03-30 ENCOUNTER — Telehealth: Payer: Self-pay | Admitting: Emergency Medicine

## 2017-03-30 ENCOUNTER — Other Ambulatory Visit: Payer: Self-pay | Admitting: Nurse Practitioner

## 2017-03-30 DIAGNOSIS — C787 Secondary malignant neoplasm of liver and intrahepatic bile duct: Principal | ICD-10-CM

## 2017-03-30 DIAGNOSIS — C259 Malignant neoplasm of pancreas, unspecified: Secondary | ICD-10-CM

## 2017-03-30 MED ORDER — NITROFURANTOIN MONOHYD MACRO 100 MG PO CAPS
100.0000 mg | ORAL_CAPSULE | Freq: Every day | ORAL | 1 refills | Status: DC
Start: 2017-04-04 — End: 2017-04-18

## 2017-03-30 MED ORDER — CIPROFLOXACIN HCL 250 MG PO TABS: 250.0000 mg | ORAL_TABLET | Freq: Two times a day (BID) | ORAL | 0 refills | Status: DC

## 2017-03-30 NOTE — Telephone Encounter (Addendum)
Left VM with patient to call back regarding this note.   ----- Message from Owens Shark, NP sent at 03/30/2017  9:39 AM EST ----- Please let her know we are going to treat her with a course of Cipro for a urinary tract infection.  After that Dr. Benay Spice would like her to begin Macrobid once a day.  I will send the prescriptions to her pharmacy.

## 2017-04-02 ENCOUNTER — Telehealth: Payer: Self-pay | Admitting: Emergency Medicine

## 2017-04-02 NOTE — Telephone Encounter (Addendum)
Pt verbalized understanding of this note.   ----- Message from Owens Shark, NP sent at 03/30/2017  9:39 AM EST ----- Please let her know we are going to treat her with a course of Cipro for a urinary tract infection.  After that Dr. Benay Spice would like her to begin Macrobid once a day.  I will send the prescriptions to her pharmacy.

## 2017-04-03 ENCOUNTER — Other Ambulatory Visit: Payer: Self-pay | Admitting: Nurse Practitioner

## 2017-04-04 ENCOUNTER — Ambulatory Visit: Payer: 59

## 2017-04-04 ENCOUNTER — Other Ambulatory Visit: Payer: 59

## 2017-04-04 MED FILL — oxyCODONE HCL 5 MG TABS: 5 | 8 days supply | Qty: 50 | Fill #0

## 2017-04-08 ENCOUNTER — Other Ambulatory Visit: Payer: Self-pay | Admitting: Oncology

## 2017-04-09 ENCOUNTER — Inpatient Hospital Stay: Payer: 59

## 2017-04-09 ENCOUNTER — Inpatient Hospital Stay: Payer: 59 | Attending: Nurse Practitioner | Admitting: Nurse Practitioner

## 2017-04-09 ENCOUNTER — Telehealth: Payer: Self-pay | Admitting: Nurse Practitioner

## 2017-04-09 ENCOUNTER — Encounter: Payer: Self-pay | Admitting: Nurse Practitioner

## 2017-04-09 VITALS — BP 105/47 | HR 68 | Temp 97.1°F | Resp 20

## 2017-04-09 VITALS — BP 103/66 | HR 96 | Temp 97.9°F | Resp 22 | Ht 63.0 in | Wt 253.8 lb

## 2017-04-09 DIAGNOSIS — E1122 Type 2 diabetes mellitus with diabetic chronic kidney disease: Secondary | ICD-10-CM | POA: Insufficient documentation

## 2017-04-09 DIAGNOSIS — R61 Generalized hyperhidrosis: Secondary | ICD-10-CM | POA: Insufficient documentation

## 2017-04-09 DIAGNOSIS — F329 Major depressive disorder, single episode, unspecified: Secondary | ICD-10-CM | POA: Insufficient documentation

## 2017-04-09 DIAGNOSIS — Z79899 Other long term (current) drug therapy: Secondary | ICD-10-CM | POA: Diagnosis not present

## 2017-04-09 DIAGNOSIS — C787 Secondary malignant neoplasm of liver and intrahepatic bile duct: Principal | ICD-10-CM

## 2017-04-09 DIAGNOSIS — Z7689 Persons encountering health services in other specified circumstances: Secondary | ICD-10-CM | POA: Insufficient documentation

## 2017-04-09 DIAGNOSIS — Z5111 Encounter for antineoplastic chemotherapy: Secondary | ICD-10-CM | POA: Insufficient documentation

## 2017-04-09 DIAGNOSIS — R42 Dizziness and giddiness: Secondary | ICD-10-CM | POA: Diagnosis not present

## 2017-04-09 DIAGNOSIS — G4730 Sleep apnea, unspecified: Secondary | ICD-10-CM | POA: Insufficient documentation

## 2017-04-09 DIAGNOSIS — C259 Malignant neoplasm of pancreas, unspecified: Secondary | ICD-10-CM

## 2017-04-09 DIAGNOSIS — I959 Hypotension, unspecified: Secondary | ICD-10-CM | POA: Insufficient documentation

## 2017-04-09 DIAGNOSIS — G8929 Other chronic pain: Secondary | ICD-10-CM | POA: Insufficient documentation

## 2017-04-09 DIAGNOSIS — I10 Essential (primary) hypertension: Secondary | ICD-10-CM | POA: Insufficient documentation

## 2017-04-09 DIAGNOSIS — G43909 Migraine, unspecified, not intractable, without status migrainosus: Secondary | ICD-10-CM | POA: Insufficient documentation

## 2017-04-09 DIAGNOSIS — M545 Low back pain: Secondary | ICD-10-CM | POA: Diagnosis not present

## 2017-04-09 DIAGNOSIS — Z87440 Personal history of urinary (tract) infections: Secondary | ICD-10-CM | POA: Diagnosis not present

## 2017-04-09 DIAGNOSIS — R21 Rash and other nonspecific skin eruption: Secondary | ICD-10-CM | POA: Diagnosis not present

## 2017-04-09 DIAGNOSIS — G893 Neoplasm related pain (acute) (chronic): Secondary | ICD-10-CM | POA: Insufficient documentation

## 2017-04-09 LAB — CBC WITH DIFFERENTIAL/PLATELET
Basophils Absolute: 0.1 10*3/uL (ref 0.0–0.1)
Basophils Relative: 1 %
EOS PCT: 4 %
Eosinophils Absolute: 0.4 10*3/uL (ref 0.0–0.5)
HCT: 32.5 % — ABNORMAL LOW (ref 34.8–46.6)
HEMOGLOBIN: 10 g/dL — AB (ref 11.6–15.9)
LYMPHS PCT: 12 %
Lymphs Abs: 1.3 10*3/uL (ref 0.9–3.3)
MCH: 27 pg (ref 25.1–34.0)
MCHC: 30.8 g/dL — AB (ref 31.5–36.0)
MCV: 87.8 fL (ref 79.5–101.0)
MONOS PCT: 11 %
Monocytes Absolute: 1.1 10*3/uL — ABNORMAL HIGH (ref 0.1–0.9)
Neutro Abs: 7.8 10*3/uL — ABNORMAL HIGH (ref 1.5–6.5)
Neutrophils Relative %: 72 %
PLATELETS: 191 10*3/uL (ref 145–400)
RBC: 3.7 MIL/uL (ref 3.70–5.45)
RDW: 18.7 % — ABNORMAL HIGH (ref 11.2–16.1)
WBC: 10.6 10*3/uL — AB (ref 3.9–10.3)

## 2017-04-09 LAB — COMPREHENSIVE METABOLIC PANEL
ALK PHOS: 169 U/L — AB (ref 40–150)
ALT: 18 U/L (ref 0–55)
ANION GAP: 10 (ref 3–11)
AST: 26 U/L (ref 5–34)
Albumin: 2.8 g/dL — ABNORMAL LOW (ref 3.5–5.0)
BUN: 8 mg/dL (ref 7–26)
CALCIUM: 9.6 mg/dL (ref 8.4–10.4)
CO2: 24 mmol/L (ref 22–29)
CREATININE: 0.86 mg/dL (ref 0.60–1.10)
Chloride: 103 mmol/L (ref 98–109)
Glucose, Bld: 234 mg/dL — ABNORMAL HIGH (ref 70–140)
Potassium: 4.1 mmol/L (ref 3.3–4.7)
Sodium: 137 mmol/L (ref 136–145)
Total Bilirubin: 0.7 mg/dL (ref 0.2–1.2)
Total Protein: 6.9 g/dL (ref 6.4–8.3)

## 2017-04-09 MED ORDER — PROCHLORPERAZINE MALEATE 10 MG PO TABS
ORAL_TABLET | ORAL | Status: AC
  Filled 2017-04-09: qty 1

## 2017-04-09 MED ORDER — SODIUM CHLORIDE 0.9 % IV SOLN
Freq: Once | INTRAVENOUS | Status: AC
  Administered 2017-04-09: 12:00:00 via INTRAVENOUS
  Filled 2017-04-09: qty 5

## 2017-04-09 MED ORDER — DEXTROSE 5 % IV SOLN
Freq: Once | INTRAVENOUS | Status: AC
  Administered 2017-04-09: 12:00:00 via INTRAVENOUS

## 2017-04-09 MED ORDER — PALONOSETRON HCL INJECTION 0.25 MG/5ML
0.2500 mg | Freq: Once | INTRAVENOUS | Status: AC
  Administered 2017-04-09: 0.25 mg via INTRAVENOUS

## 2017-04-09 MED ORDER — PALONOSETRON HCL INJECTION 0.25 MG/5ML
INTRAVENOUS | Status: AC
  Filled 2017-04-09: qty 5

## 2017-04-09 MED ORDER — ATROPINE SULFATE 1 MG/ML IJ SOLN
0.5000 mg | Freq: Once | INTRAMUSCULAR | Status: AC | PRN
  Administered 2017-04-09: 0.5 mg via INTRAVENOUS

## 2017-04-09 MED ORDER — HEPARIN SOD (PORK) LOCK FLUSH 100 UNIT/ML IV SOLN
500.0000 [IU] | Freq: Once | INTRAVENOUS | Status: DC | PRN
  Filled 2017-04-09: qty 5

## 2017-04-09 MED ORDER — PROCHLORPERAZINE MALEATE 10 MG PO TABS
10.0000 mg | ORAL_TABLET | Freq: Once | ORAL | Status: AC
  Administered 2017-04-09: 10 mg via ORAL

## 2017-04-09 MED ORDER — SODIUM CHLORIDE 0.9 % IV SOLN
2400.0000 mg/m2 | INTRAVENOUS | Status: DC
  Administered 2017-04-09: 3650 mg via INTRAVENOUS
  Filled 2017-04-09: qty 73

## 2017-04-09 MED ORDER — SODIUM CHLORIDE 0.9 % IV SOLN
300.0000 mg/m2 | Freq: Once | INTRAVENOUS | Status: AC
  Administered 2017-04-09: 460 mg via INTRAVENOUS
  Filled 2017-04-09: qty 23

## 2017-04-09 MED ORDER — SODIUM CHLORIDE 0.9 % IV SOLN
125.0000 mg/m2 | Freq: Once | INTRAVENOUS | Status: AC
  Administered 2017-04-09: 200 mg via INTRAVENOUS
  Filled 2017-04-09: qty 10

## 2017-04-09 MED ORDER — SODIUM CHLORIDE 0.9% FLUSH
10.0000 mL | INTRAVENOUS | Status: DC | PRN
  Filled 2017-04-09: qty 10

## 2017-04-09 MED ORDER — OXALIPLATIN CHEMO INJECTION 100 MG/20ML
65.0000 mg/m2 | Freq: Once | INTRAVENOUS | Status: AC
  Administered 2017-04-09: 100 mg via INTRAVENOUS
  Filled 2017-04-09: qty 20

## 2017-04-09 MED ORDER — ATROPINE SULFATE 1 MG/ML IJ SOLN
INTRAMUSCULAR | Status: AC
  Filled 2017-04-09: qty 1

## 2017-04-09 MED ORDER — LEUCOVORIN CALCIUM INJECTION 350 MG: 300.0000 mg/m2 | Freq: Once | INTRAVENOUS | Status: DC

## 2017-04-09 NOTE — Progress Notes (Addendum)
[facility] OFFICE PROGRESS NOTE   Diagnosis: Pancreas cancer  INTERVAL HISTORY:   Heather Frederick returns as scheduled.  She completed cycle 4 FOLFIRINOX 03/21/2017.  No significant nausea/vomiting.  No mouth sores.  No diarrhea.  Bowels moving with the aid of a laxative.  She continues to have intermittent shortness of breath.  Dizziness has been improved until this morning.  She continues MS Contin twice daily.  She feels the oxycodone is no longer working for breakthrough pain and would like to switch to Dilaudid.  Overall good appetite.  Objective:  Vital signs in last 24 hours:  Blood pressure 103/66, pulse 96, temperature 97.9 F (36.6 C), temperature source Oral, resp. rate (!) 22, height '5\' 3"'  (1.6 m), weight 253 lb 12.8 oz (115.1 kg), SpO2 99 %.    HEENT: No thrush or ulcers. Resp: Lungs clear bilaterally. Cardio: Regular rate and rhythm. GI: Abdomen soft and nontender.  No hepatomegaly. Vascular: No leg edema.  Port-A-Cath without erythema.  Lab Results:  Lab Results  Component Value Date   WBC 10.6 (H) 04/09/2017   HGB 10.0 (L) 04/09/2017   HCT 32.5 (L) 04/09/2017   MCV 87.8 04/09/2017   PLT 191 04/09/2017   NEUTROABS 7.8 (H) 04/09/2017    Imaging:  No results found.  Medications: I have reviewed the patient's current medications.  Assessment/Plan: 1. Metastatic pancreas cancer ? Pancreas body mass and liver metastases noted on CT abdomen/pelvis 08/11/2016 ? Ultrasound-guided biopsy of a right liver lesion 08/16/2016 revealed poorly differentiated adenocarcinoma consistent with pancreas cancer ? Foundation 1-microsatellite stable; tumor mutational burden 1; ERBB2 amplification ? Cycle 1 gemcitabine/Abraxane 09/13/2016; 09/29/2016 ? Cycle 2 gemcitabine/Abraxane 10/11/2016, 10/18/2016 ? Cycle 3 gemcitabine/Abraxane 11/01/2016, 11/08/2016 ? Cycle 4 gemcitabine/Abraxane 11/23/2016,11/29/2016 ? CT chest 12/06/2016-liver lesions appear  smaller ? Cycle 5 gemcitabine/Abraxane 12/13/2016 ? CT abdomen/pelvis 01/01/2017-new and enlarging hepatic masses. Enlarging pancreatic mass. ? Cycle 6 gemcitabine/Abraxane 01/03/2017 ? Rising CA 19-9, increased pain October 2018 ? Cycle 1 FOLFOX 01/31/2017 ? Cycle 2 FOLFOX 02/19/2017 ? Cycle 3FOLFIRINOX 03/06/2017 ? CT 03/11/2017 (compared to 01/01/2017) increased liver lesions and enlargement of the pancreas mass ? Cycle 4 FOLFIRINOX 03/21/2017 (oxaliplatin dose reduced and Neulasta added) ? Cycle 5 FOLFIRINOX 04/09/2017  2. Pain secondary to pancreas cancer, MS Contin added 03/13/2017  3. Hypertension  4. Sleep apnea  5. Chronic low back pain  6. Recurrent urinary tract infections  7. Depression  8. Migraine headaches  9. Port-A-Cath placement 08/28/2016  10. Rash following cycle 1 gemcitabine/Abraxane-drug rash?  11. Nausea/vomiting following cycle 1 gemcitabine/Abraxane-antiemetic regimen adjusted with addition of Aloxi  12. Diabetes  13. Right cephalic vein thrombosis [id number]/12/[id number] with Xarelto  14. CT chest 12/06/2016 done to evaluate dyspnea-several 3-4 mm nodular opacities in the lung parenchyma etiology uncertain. Known mass in the body of the pancreas measuring 3.3 x 2.4 cm;small enhancing lesion in the anterior dome of the liver measures 8 mm.  15. Right forearm rash following cycle 5 day 8 gemcitabine/Abraxane. Resolved  16.Klebsiella urinary tract infection 03/14/2017   Disposition: Heather Frederick appears unchanged.  Se has completed 4 cycles of chemotherapy overall, 2 FOLFOX then 2 FOLFIRINOX.  Plan to proceed with FOLFIRINOX today as scheduled.  We will refer her for restaging CTs after the 6 cycle of chemotherapy overall.  She will try Dilaudid 2 mg every 4 hours as needed for abdominal pain.  She will contact the office if this is not effective.  She will return for lab, follow-up and FOLFIRINOX in  2 weeks.  She will  contact the office in the interim as outlined above or with any other problems.  Patient seen with Dr. Benay Spice.  25 minutes were spent face-to-face at today's visit with the majority of that time involved in counseling/coordination of care.    Ned Card ANP/GNP-BC   04/09/2017  10:40 AM  This was a shared visit with Ned Card.  Heather Frederick appears unchanged.  We adjusted the narcotic regimen today.  She will complete a third cycle of chemotherapy with irinotecan today.  We will plan for a restaging CT after the fourth cycle of irinotecan based therapy.  She will receive intravenous fluids on day 3 following chemotherapy.  Julieanne Manson, MD

## 2017-04-09 NOTE — Telephone Encounter (Signed)
Scheduled appt per 1/14 los - Per LT okay for 1/28 to be scheduled on 1/29 - Gave patient AVS and calender per los.

## 2017-04-09 NOTE — Patient Instructions (Signed)
[facility] Discharge Instructions for Patients Receiving Chemotherapy  Today you received the following chemotherapy agents Oxaliplatin, Irinotecan, Leucovorin and Fluorouracil.  To help prevent nausea and vomiting after your treatment, we encourage you to take your nausea medication as directed. If you develop nausea and vomiting that is not controlled by your nausea medication, call the clinic.   BELOW ARE SYMPTOMS THAT SHOULD BE REPORTED IMMEDIATELY:  *FEVER GREATER THAN 100.5 F  *CHILLS WITH OR WITHOUT FEVER  NAUSEA AND VOMITING THAT IS NOT CONTROLLED WITH YOUR NAUSEA MEDICATION  *UNUSUAL SHORTNESS OF BREATH  *UNUSUAL BRUISING OR BLEEDING  TENDERNESS IN MOUTH AND THROAT WITH OR WITHOUT PRESENCE OF ULCERS  *URINARY PROBLEMS  *BOWEL PROBLEMS  UNUSUAL RASH Items with * indicate a potential emergency and should be followed up as soon as possible.  Feel free to call the clinic should you have any questions or concerns. The clinic phone number is [phone].  Please show the [facility] at check-in to the Emergency Department and triage nurse.

## 2017-04-09 NOTE — Progress Notes (Signed)
Pt reports feeling slight nausea. P/C to [facility] and V.O. For 1x po dose of compazine received.

## 2017-04-11 ENCOUNTER — Ambulatory Visit: Payer: 59

## 2017-04-11 ENCOUNTER — Inpatient Hospital Stay: Payer: 59

## 2017-04-11 VITALS — BP 113/48 | HR 75 | Temp 98.0°F | Resp 16

## 2017-04-11 DIAGNOSIS — C259 Malignant neoplasm of pancreas, unspecified: Secondary | ICD-10-CM

## 2017-04-11 DIAGNOSIS — C787 Secondary malignant neoplasm of liver and intrahepatic bile duct: Principal | ICD-10-CM

## 2017-04-11 LAB — CANCER ANTIGEN 19-9: CAN 19-9: 4804 U/mL — AB (ref 0–35)

## 2017-04-11 MED ORDER — PEGFILGRASTIM INJECTION 6 MG/0.6ML ~~LOC~~
6.0000 mg | PREFILLED_SYRINGE | Freq: Once | SUBCUTANEOUS | Status: AC
  Administered 2017-04-11: 6 mg via SUBCUTANEOUS

## 2017-04-11 MED ORDER — PEGFILGRASTIM INJECTION 6 MG/0.6ML ~~LOC~~
PREFILLED_SYRINGE | SUBCUTANEOUS | Status: AC
  Filled 2017-04-11: qty 0.6

## 2017-04-11 MED ORDER — HEPARIN SOD (PORK) LOCK FLUSH 100 UNIT/ML IV SOLN
500.0000 [IU] | Freq: Once | INTRAVENOUS | Status: AC | PRN
  Administered 2017-04-11: 500 [IU]
  Filled 2017-04-11: qty 5

## 2017-04-11 MED ORDER — SODIUM CHLORIDE 0.9% FLUSH
10.0000 mL | INTRAVENOUS | Status: DC | PRN
  Administered 2017-04-11: 10 mL
  Filled 2017-04-11: qty 10

## 2017-04-11 MED ORDER — SODIUM CHLORIDE 0.9 % IV SOLN
INTRAVENOUS | Status: DC
  Administered 2017-04-11: 13:00:00 via INTRAVENOUS

## 2017-04-11 MED ORDER — LORAZEPAM 0.5 MG PO TABS: 0.5000 mg | ORAL_TABLET | Freq: Three times a day (TID) | ORAL | 0 refills | Status: DC | PRN

## 2017-04-11 NOTE — Progress Notes (Signed)
Pt requesting refill of MS Contin and Lorazepam. Spoke with pt in infusion room, she reports she is taking MS Contin Q12 hours as prescribed. She just noticed it "was getting a little low." Informed her we will refill within 1-2 days of running out of script. Teach back done. Pt requested refill of Lorazepam, reports it is the only thing that helps nausea. Reviewed with Dr. Benay Spice. Order received and called to [facility] pharmacy.

## 2017-04-11 NOTE — Patient Instructions (Signed)

## 2017-04-13 ENCOUNTER — Inpatient Hospital Stay (HOSPITAL_BASED_OUTPATIENT_CLINIC_OR_DEPARTMENT_OTHER): Payer: 59 | Admitting: Medical

## 2017-04-13 ENCOUNTER — Encounter (HOSPITAL_COMMUNITY): Payer: Self-pay

## 2017-04-13 ENCOUNTER — Telehealth: Payer: Self-pay

## 2017-04-13 ENCOUNTER — Other Ambulatory Visit: Payer: Self-pay

## 2017-04-13 ENCOUNTER — Emergency Department (HOSPITAL_COMMUNITY): Payer: 59

## 2017-04-13 ENCOUNTER — Other Ambulatory Visit: Payer: Self-pay | Admitting: Medical

## 2017-04-13 ENCOUNTER — Inpatient Hospital Stay (HOSPITAL_COMMUNITY)
Admission: EM | Admit: 2017-04-13 | Discharge: 2017-04-18 | DRG: 872 | Disposition: A | Discharge status: Home / Self Care | Payer: 59 | Attending: Family Medicine | Admitting: Family Medicine

## 2017-04-13 ENCOUNTER — Inpatient Hospital Stay: Payer: 59

## 2017-04-13 ENCOUNTER — Telehealth: Payer: Self-pay | Admitting: *Deleted

## 2017-04-13 VITALS — BP 103/70 | HR 95 | Temp 96.8°F | Resp 22 | Wt 253.0 lb

## 2017-04-13 DIAGNOSIS — E876 Hypokalemia: Secondary | ICD-10-CM | POA: Diagnosis present

## 2017-04-13 DIAGNOSIS — R61 Generalized hyperhidrosis: Secondary | ICD-10-CM | POA: Diagnosis present

## 2017-04-13 DIAGNOSIS — Z86718 Personal history of other venous thrombosis and embolism: Secondary | ICD-10-CM | POA: Diagnosis not present

## 2017-04-13 DIAGNOSIS — R000 Tachycardia, unspecified: Secondary | ICD-10-CM | POA: Diagnosis present

## 2017-04-13 DIAGNOSIS — Z841 Family history of disorders of kidney and ureter: Secondary | ICD-10-CM

## 2017-04-13 DIAGNOSIS — Z7901 Long term (current) use of anticoagulants: Secondary | ICD-10-CM | POA: Diagnosis not present

## 2017-04-13 DIAGNOSIS — R6510 Systemic inflammatory response syndrome (SIRS) of non-infectious origin without acute organ dysfunction: Secondary | ICD-10-CM | POA: Diagnosis present

## 2017-04-13 DIAGNOSIS — G43909 Migraine, unspecified, not intractable, without status migrainosus: Secondary | ICD-10-CM | POA: Diagnosis present

## 2017-04-13 DIAGNOSIS — R7881 Bacteremia: Secondary | ICD-10-CM | POA: Diagnosis present

## 2017-04-13 DIAGNOSIS — Z823 Family history of stroke: Secondary | ICD-10-CM

## 2017-04-13 DIAGNOSIS — E1122 Type 2 diabetes mellitus with diabetic chronic kidney disease: Secondary | ICD-10-CM | POA: Diagnosis not present

## 2017-04-13 DIAGNOSIS — I959 Hypotension, unspecified: Secondary | ICD-10-CM | POA: Diagnosis present

## 2017-04-13 DIAGNOSIS — C250 Malignant neoplasm of head of pancreas: Secondary | ICD-10-CM | POA: Diagnosis present

## 2017-04-13 DIAGNOSIS — Z832 Family history of diseases of the blood and blood-forming organs and certain disorders involving the immune mechanism: Secondary | ICD-10-CM

## 2017-04-13 DIAGNOSIS — G4733 Obstructive sleep apnea (adult) (pediatric): Secondary | ICD-10-CM | POA: Diagnosis not present

## 2017-04-13 DIAGNOSIS — E872 Acidosis: Secondary | ICD-10-CM | POA: Diagnosis present

## 2017-04-13 DIAGNOSIS — F329 Major depressive disorder, single episode, unspecified: Secondary | ICD-10-CM | POA: Diagnosis not present

## 2017-04-13 DIAGNOSIS — R0682 Tachypnea, not elsewhere classified: Secondary | ICD-10-CM | POA: Diagnosis present

## 2017-04-13 DIAGNOSIS — G4730 Sleep apnea, unspecified: Secondary | ICD-10-CM | POA: Diagnosis not present

## 2017-04-13 DIAGNOSIS — R911 Solitary pulmonary nodule: Secondary | ICD-10-CM | POA: Diagnosis present

## 2017-04-13 DIAGNOSIS — C259 Malignant neoplasm of pancreas, unspecified: Secondary | ICD-10-CM | POA: Diagnosis present

## 2017-04-13 DIAGNOSIS — M545 Low back pain: Secondary | ICD-10-CM | POA: Diagnosis not present

## 2017-04-13 DIAGNOSIS — F419 Anxiety disorder, unspecified: Secondary | ICD-10-CM

## 2017-04-13 DIAGNOSIS — C787 Secondary malignant neoplasm of liver and intrahepatic bile duct: Secondary | ICD-10-CM | POA: Diagnosis present

## 2017-04-13 DIAGNOSIS — E871 Hypo-osmolality and hyponatremia: Secondary | ICD-10-CM | POA: Diagnosis present

## 2017-04-13 DIAGNOSIS — E119 Type 2 diabetes mellitus without complications: Secondary | ICD-10-CM | POA: Diagnosis present

## 2017-04-13 DIAGNOSIS — Z794 Long term (current) use of insulin: Secondary | ICD-10-CM

## 2017-04-13 DIAGNOSIS — Z7982 Long term (current) use of aspirin: Secondary | ICD-10-CM

## 2017-04-13 DIAGNOSIS — F410 Panic disorder [episodic paroxysmal anxiety] without agoraphobia: Secondary | ICD-10-CM | POA: Diagnosis present

## 2017-04-13 DIAGNOSIS — Z7689 Persons encountering health services in other specified circumstances: Secondary | ICD-10-CM | POA: Diagnosis not present

## 2017-04-13 DIAGNOSIS — E861 Hypovolemia: Secondary | ICD-10-CM | POA: Diagnosis present

## 2017-04-13 DIAGNOSIS — R21 Rash and other nonspecific skin eruption: Secondary | ICD-10-CM | POA: Diagnosis not present

## 2017-04-13 DIAGNOSIS — D6481 Anemia due to antineoplastic chemotherapy: Secondary | ICD-10-CM | POA: Diagnosis not present

## 2017-04-13 DIAGNOSIS — Z87440 Personal history of urinary (tract) infections: Secondary | ICD-10-CM | POA: Diagnosis not present

## 2017-04-13 DIAGNOSIS — I10 Essential (primary) hypertension: Secondary | ICD-10-CM | POA: Diagnosis not present

## 2017-04-13 DIAGNOSIS — Z800 Family history of malignant neoplasm of digestive organs: Secondary | ICD-10-CM

## 2017-04-13 DIAGNOSIS — G893 Neoplasm related pain (acute) (chronic): Secondary | ICD-10-CM | POA: Diagnosis present

## 2017-04-13 DIAGNOSIS — R531 Weakness: Secondary | ICD-10-CM

## 2017-04-13 DIAGNOSIS — Z9989 Dependence on other enabling machines and devices: Secondary | ICD-10-CM

## 2017-04-13 DIAGNOSIS — R5381 Other malaise: Secondary | ICD-10-CM | POA: Diagnosis present

## 2017-04-13 DIAGNOSIS — Z79891 Long term (current) use of opiate analgesic: Secondary | ICD-10-CM

## 2017-04-13 DIAGNOSIS — Z803 Family history of malignant neoplasm of breast: Secondary | ICD-10-CM

## 2017-04-13 DIAGNOSIS — Z6841 Body Mass Index (BMI) 40.0 and over, adult: Secondary | ICD-10-CM | POA: Diagnosis not present

## 2017-04-13 DIAGNOSIS — Z8249 Family history of ischemic heart disease and other diseases of the circulatory system: Secondary | ICD-10-CM

## 2017-04-13 DIAGNOSIS — I951 Orthostatic hypotension: Secondary | ICD-10-CM

## 2017-04-13 DIAGNOSIS — R112 Nausea with vomiting, unspecified: Secondary | ICD-10-CM | POA: Diagnosis present

## 2017-04-13 DIAGNOSIS — T451X5A Adverse effect of antineoplastic and immunosuppressive drugs, initial encounter: Secondary | ICD-10-CM | POA: Diagnosis not present

## 2017-04-13 DIAGNOSIS — I82419 Acute embolism and thrombosis of unspecified femoral vein: Secondary | ICD-10-CM

## 2017-04-13 DIAGNOSIS — Z79899 Other long term (current) drug therapy: Secondary | ICD-10-CM | POA: Diagnosis not present

## 2017-04-13 DIAGNOSIS — Z5111 Encounter for antineoplastic chemotherapy: Secondary | ICD-10-CM | POA: Diagnosis not present

## 2017-04-13 DIAGNOSIS — C251 Malignant neoplasm of body of pancreas: Secondary | ICD-10-CM | POA: Diagnosis not present

## 2017-04-13 DIAGNOSIS — F32A Depression, unspecified: Secondary | ICD-10-CM | POA: Diagnosis present

## 2017-04-13 DIAGNOSIS — R072 Precordial pain: Secondary | ICD-10-CM

## 2017-04-13 DIAGNOSIS — R42 Dizziness and giddiness: Secondary | ICD-10-CM | POA: Diagnosis not present

## 2017-04-13 DIAGNOSIS — G8929 Other chronic pain: Secondary | ICD-10-CM | POA: Diagnosis not present

## 2017-04-13 LAB — CBC WITH DIFFERENTIAL (CANCER CENTER ONLY)
BASOS PCT: 0 %
Basophils Absolute: 0 10*3/uL (ref 0.0–0.1)
Eosinophils Absolute: 0.5 10*3/uL (ref 0.0–0.5)
Eosinophils Relative: 2 %
HEMATOCRIT: 31.7 % — AB (ref 34.8–46.6)
HEMOGLOBIN: 10.2 g/dL — AB (ref 11.6–15.9)
Lymphocytes Relative: 7 %
Lymphs Abs: 2.1 10*3/uL (ref 0.9–3.3)
MCH: 27.3 pg (ref 25.1–34.0)
MCHC: 32.2 g/dL (ref 31.5–36.0)
MCV: 85 fL (ref 79.5–101.0)
Monocytes Absolute: 0.6 10*3/uL (ref 0.1–0.9)
Monocytes Relative: 2 %
NEUTROS ABS: 26.4 10*3/uL — AB (ref 1.5–6.5)
NEUTROS PCT: 89 %
Platelet Count: 165 10*3/uL (ref 145–400)
RBC: 3.73 MIL/uL (ref 3.70–5.45)
RDW: 18.6 % — ABNORMAL HIGH (ref 11.2–16.1)
WBC: 29.5 10*3/uL — AB (ref 3.9–10.3)

## 2017-04-13 LAB — CBG MONITORING, ED
GLUCOSE-CAPILLARY: 131 mg/dL — AB (ref 65–99)
Glucose-Capillary: 138 mg/dL — ABNORMAL HIGH (ref 65–99)

## 2017-04-13 LAB — CMP (CANCER CENTER ONLY)
ALK PHOS: 220 U/L — AB (ref 40–150)
ALT: 29 U/L (ref 0–55)
ANION GAP: 14 — AB (ref 3–11)
AST: 27 U/L (ref 5–34)
Albumin: 2.8 g/dL — ABNORMAL LOW (ref 3.5–5.0)
BILIRUBIN TOTAL: 1.4 mg/dL — AB (ref 0.2–1.2)
BUN: 11 mg/dL (ref 7–26)
CALCIUM: 9.6 mg/dL (ref 8.4–10.4)
CO2: 21 mmol/L — ABNORMAL LOW (ref 22–29)
Chloride: 98 mmol/L (ref 98–109)
Creatinine: 0.77 mg/dL (ref 0.60–1.10)
GFR, Estimated: >60 mL/min (ref >60)
Glucose, Bld: 139 mg/dL (ref 70–140)
Potassium: 3.7 mmol/L (ref 3.3–4.7)
SODIUM: 133 mmol/L — AB (ref 136–145)
TOTAL PROTEIN: 6.9 g/dL (ref 6.4–8.3)

## 2017-04-13 LAB — I-STAT TROPONIN, ED: TROPONIN I, POC: 0 ng/mL (ref 0.00–0.08)

## 2017-04-13 LAB — MAGNESIUM: Magnesium: 1.6 mg/dL (ref 1.5–2.5)

## 2017-04-13 LAB — LIPASE, BLOOD: LIPASE: 17 U/L (ref 11–51)

## 2017-04-13 LAB — I-STAT CG4 LACTIC ACID, ED
Lactic Acid, Venous: 2.6 mmol/L (ref 0.5–1.9)
Lactic Acid, Venous: 0.89 mmol/L (ref 0.5–1.9)

## 2017-04-13 MED ORDER — LORAZEPAM 0.5 MG PO TABS: 0.5000 mg | ORAL_TABLET | Freq: Three times a day (TID) | ORAL | Status: DC | PRN

## 2017-04-13 MED ORDER — SODIUM CHLORIDE 0.9 % IV BOLUS (SEPSIS)
1000.0000 mL | Freq: Once | INTRAVENOUS | Status: AC
  Administered 2017-04-13: 1000 mL via INTRAVENOUS

## 2017-04-13 MED ORDER — SODIUM CHLORIDE 0.9 % IV SOLN
INTRAVENOUS | Status: DC
  Administered 2017-04-13: 13:00:00 via INTRAVENOUS

## 2017-04-13 MED ORDER — PANTOPRAZOLE SODIUM 40 MG PO TBEC
40.0000 mg | DELAYED_RELEASE_TABLET | Freq: Two times a day (BID) | ORAL | Status: DC
  Administered 2017-04-13: 40 mg via ORAL
  Filled 2017-04-13: qty 1

## 2017-04-13 MED ORDER — LORAZEPAM 2 MG/ML IJ SOLN
1.0000 mg | Freq: Once | INTRAMUSCULAR | Status: AC
  Administered 2017-04-13: 1 mg via INTRAVENOUS
  Filled 2017-04-13: qty 1

## 2017-04-13 MED ORDER — MORPHINE SULFATE (PF) 4 MG/ML IV SOLN
4.0000 mg | Freq: Once | INTRAVENOUS | Status: AC
  Administered 2017-04-13: 4 mg via INTRAVENOUS
  Filled 2017-04-13: qty 1

## 2017-04-13 MED ORDER — ONDANSETRON HCL 4 MG PO TABS: 4.0000 mg | ORAL_TABLET | Freq: Four times a day (QID) | ORAL | Status: DC | PRN

## 2017-04-13 MED ORDER — MORPHINE SULFATE ER 30 MG PO TBCR
30.0000 mg | EXTENDED_RELEASE_TABLET | Freq: Two times a day (BID) | ORAL | Status: DC
  Administered 2017-04-13 – 2017-04-18 (×10): 30 mg via ORAL
  Filled 2017-04-13 (×6): qty 1
  Filled 2017-04-13 (×2): qty 2
  Filled 2017-04-13 (×2): qty 1

## 2017-04-13 MED ORDER — FENTANYL CITRATE (PF) 100 MCG/2ML IJ SOLN
50.0000 ug | Freq: Once | INTRAMUSCULAR | Status: AC
  Administered 2017-04-13: 50 ug via INTRAVENOUS
  Filled 2017-04-13: qty 2

## 2017-04-13 MED ORDER — ACETAMINOPHEN 325 MG PO TABS: 650.0000 mg | ORAL_TABLET | Freq: Four times a day (QID) | ORAL | Status: DC | PRN

## 2017-04-13 MED ORDER — ONDANSETRON HCL 4 MG/2ML IJ SOLN
4.0000 mg | Freq: Four times a day (QID) | INTRAMUSCULAR | Status: DC | PRN
  Administered 2017-04-16 – 2017-04-18 (×4): 4 mg via INTRAVENOUS
  Filled 2017-04-13 (×4): qty 2

## 2017-04-13 MED ORDER — PANTOPRAZOLE SODIUM 40 MG IV SOLR
40.0000 mg | Freq: Once | INTRAVENOUS | Status: AC
  Administered 2017-04-13: 40 mg via INTRAVENOUS
  Filled 2017-04-13: qty 40

## 2017-04-13 MED ORDER — IOPAMIDOL (ISOVUE-370) INJECTION 76%
INTRAVENOUS | Status: AC
  Filled 2017-04-13: qty 100

## 2017-04-13 MED ORDER — ASPIRIN EC 81 MG PO TBEC
81.0000 mg | DELAYED_RELEASE_TABLET | Freq: Every day | ORAL | Status: DC
  Administered 2017-04-13 – 2017-04-18 (×6): 81 mg via ORAL
  Filled 2017-04-13 (×7): qty 1

## 2017-04-13 MED ORDER — OXYBUTYNIN CHLORIDE ER 5 MG PO TB24
10.0000 mg | ORAL_TABLET | Freq: Every morning | ORAL | Status: DC
  Administered 2017-04-14 – 2017-04-18 (×5): 10 mg via ORAL
  Filled 2017-04-13 (×3): qty 2
  Filled 2017-04-13: qty 1
  Filled 2017-04-13 (×2): qty 2

## 2017-04-13 MED ORDER — VANCOMYCIN HCL 10 G IV SOLR
1250.0000 mg | INTRAVENOUS | Status: DC
  Administered 2017-04-14 – 2017-04-17 (×4): 1250 mg via INTRAVENOUS
  Filled 2017-04-13 (×5): qty 1250

## 2017-04-13 MED ORDER — CITALOPRAM HYDROBROMIDE 20 MG PO TABS
20.0000 mg | ORAL_TABLET | Freq: Every day | ORAL | Status: DC
  Administered 2017-04-13 – 2017-04-18 (×6): 20 mg via ORAL
  Filled 2017-04-13: qty 2
  Filled 2017-04-13: qty 1
  Filled 2017-04-13: qty 2
  Filled 2017-04-13 (×3): qty 1

## 2017-04-13 MED ORDER — VANCOMYCIN HCL IN DEXTROSE 1-5 GM/200ML-% IV SOLN: 1000.0000 mg | Freq: Once | INTRAVENOUS | Status: DC

## 2017-04-13 MED ORDER — OXYCODONE HCL 5 MG PO TABS
5.0000 mg | ORAL_TABLET | ORAL | Status: DC | PRN
  Administered 2017-04-15 – 2017-04-16 (×4): 5 mg via ORAL
  Filled 2017-04-13 (×4): qty 1

## 2017-04-13 MED ORDER — LIDOCAINE-PRILOCAINE 2.5-2.5 % EX CREA
TOPICAL_CREAM | CUTANEOUS | Status: DC | PRN
  Filled 2017-04-13: qty 5

## 2017-04-13 MED ORDER — SODIUM CHLORIDE 0.9 % IV SOLN
Freq: Once | INTRAVENOUS | Status: AC
  Administered 2017-04-13: 22:00:00 via INTRAVENOUS

## 2017-04-13 MED ORDER — CYCLOBENZAPRINE HCL 5 MG PO TABS
5.0000 mg | ORAL_TABLET | Freq: Three times a day (TID) | ORAL | Status: DC | PRN
  Administered 2017-04-14 – 2017-04-18 (×2): 5 mg via ORAL
  Filled 2017-04-13 (×2): qty 1

## 2017-04-13 MED ORDER — SIMETHICONE 80 MG PO CHEW
80.0000 mg | CHEWABLE_TABLET | Freq: Two times a day (BID) | ORAL | Status: DC | PRN
  Filled 2017-04-13: qty 1

## 2017-04-13 MED ORDER — HYDROMORPHONE HCL 2 MG PO TABS
4.0000 mg | ORAL_TABLET | ORAL | Status: DC | PRN
  Administered 2017-04-14 – 2017-04-15 (×3): 6 mg via ORAL
  Administered 2017-04-16: 4 mg via ORAL
  Filled 2017-04-13 (×3): qty 3
  Filled 2017-04-13: qty 2

## 2017-04-13 MED ORDER — PIPERACILLIN-TAZOBACTAM 3.375 G IVPB
3.3750 g | Freq: Three times a day (TID) | INTRAVENOUS | Status: DC
  Administered 2017-04-14 – 2017-04-16 (×7): 3.375 g via INTRAVENOUS
  Filled 2017-04-13 (×7): qty 50

## 2017-04-13 MED ORDER — RIVAROXABAN 20 MG PO TABS
20.0000 mg | ORAL_TABLET | Freq: Every day | ORAL | Status: DC
  Administered 2017-04-13: 20 mg via ORAL
  Filled 2017-04-13 (×2): qty 1

## 2017-04-13 MED ORDER — PREGABALIN 75 MG PO CAPS
200.0000 mg | ORAL_CAPSULE | Freq: Every day | ORAL | Status: DC
  Administered 2017-04-13 – 2017-04-18 (×6): 200 mg via ORAL
  Filled 2017-04-13 (×3): qty 1
  Filled 2017-04-13: qty 4
  Filled 2017-04-13: qty 1
  Filled 2017-04-13: qty 4

## 2017-04-13 MED ORDER — ACETAMINOPHEN 650 MG RE SUPP: 650.0000 mg | Freq: Four times a day (QID) | RECTAL | Status: DC | PRN

## 2017-04-13 MED ORDER — POLYETHYLENE GLYCOL 3350 17 G PO PACK: 17.0000 g | PACK | Freq: Every day | ORAL | Status: DC | PRN

## 2017-04-13 MED ORDER — PIPERACILLIN-TAZOBACTAM 3.375 G IVPB 30 MIN
3.3750 g | Freq: Once | INTRAVENOUS | Status: AC
  Administered 2017-04-13: 3.375 g via INTRAVENOUS
  Filled 2017-04-13: qty 50

## 2017-04-13 MED ORDER — ELETRIPTAN HYDROBROMIDE 40 MG PO TABS
40.0000 mg | ORAL_TABLET | ORAL | Status: DC | PRN
  Filled 2017-04-13: qty 1

## 2017-04-13 MED ORDER — VANCOMYCIN HCL 10 G IV SOLR
2500.0000 mg | Freq: Once | INTRAVENOUS | Status: AC
  Administered 2017-04-13: 2500 mg via INTRAVENOUS
  Filled 2017-04-13: qty 500

## 2017-04-13 MED ORDER — IPRATROPIUM-ALBUTEROL 0.5-2.5 (3) MG/3ML IN SOLN: 3.0000 mL | RESPIRATORY_TRACT | Status: DC | PRN

## 2017-04-13 MED ORDER — HYDROXYZINE HCL 10 MG PO TABS: 10.0000 mg | ORAL_TABLET | Freq: Three times a day (TID) | ORAL | Status: DC | PRN

## 2017-04-13 MED ORDER — IOPAMIDOL (ISOVUE-370) INJECTION 76%
100.0000 mL | Freq: Once | INTRAVENOUS | Status: AC | PRN
  Administered 2017-04-13: 100 mL via INTRAVENOUS

## 2017-04-13 NOTE — ED Notes (Signed)
Patient transported to CT 

## 2017-04-13 NOTE — ED Notes (Signed)
Patient transported to X-ray 

## 2017-04-13 NOTE — Telephone Encounter (Signed)
Late entry for call received by spouse asking for A.P.P. or provider.  Further assessment , she's weak, sweating profusely.  This has happened before and needed admission.  May be unable to walk within the next few hours." Information given to provider.  Provider asked blood sugar.  Verbal order received and read back from Dr. Benay Spice to schedule a [facility] visit.  Proceed to ED if too sick for [facility].    Husband "I'll give Humalog.  This is the first blood sugar check today = 145.  We'd like to come in, live right around the corner." Come in now as directed by [facility].

## 2017-04-13 NOTE — Progress Notes (Signed)
These preliminary result these preliminary results were noted.  Awaiting final report.

## 2017-04-13 NOTE — ED Provider Notes (Addendum)
[facility] DEPT Provider Note   CSN: [id number] Arrival date & time: 04/13/17  1322     History   Chief Complaint Chief Complaint  Patient presents with  . Hypotension    HPI Heather Frederick is a 63 y.o. female.  The history is provided by the patient. No language interpreter was used.    Heather Frederick is a 62 y.o. female who presents to the Emergency Department complaining of hypotension.  Patient presents as referral from the cancer center for evaluation of hypotension and malaise.  She is currently undergoing chemotherapy for pancreatic cancer.  Her last infusion was on Monday and she discontinued her home pump on Wednesday.  She was feeling well until last night when she developed diaphoresis, chills, malaise, severe nausea, sob.  She has chronic right-sided abdominal pain due to her cancer and this is slightly worse than usual.  No reports of fever, cough, vomiting, diarrhea, dysuria.  She has had similar episodes in the past secondary to her chemotherapy infusion.  She presented to the [facility] today for evaluation and was referred to the emergency department due to hypotension.  Symptoms are severe and constant in nature.  Past Medical History:  Diagnosis Date  . Allergy   . Anxiety   . Arthritis   . Benign essential HTN 09/23/2014  . Cancer ([place])   . Chronic kidney disease    uti  . Depression   . Dyslipidemia   . Elevated liver enzymes   . Family history of anesthesia complication    father has a severe hard time waking up  . Gallstones    a. Seen on CT 01/2014.  Marland Kitchen GERD (gastroesophageal reflux disease)   . Hard of hearing   . Hepatic steatosis   . History of frequent urinary tract infections   . Hyperlipidemia   . Hypertension   . Lateral epicondylitis of right elbow   . Mental disorder   . Meralgia paresthetica of right side 10/02/2012   slight at 05/2014  . Migraine headache   . Obesity   . OSA (obstructive sleep apnea)      severe with AHI 37/hr now on CPAP at 18cm H2O  . Osteoarthritis   . Pneumonia    Feb 2018  . Raynaud disease    in feet per patient   . Sepsis ([place]) 02/23/2017  . Sleep apnea    wears c-pap  . Urinary tract infection     Patient Active Problem List   Diagnosis Date Noted  . SIRS (systemic inflammatory response syndrome) ([place]) 04/13/2017  . Orthostatic hypotension 03/15/2017  . Pain 03/12/2017  . Abdominal pain, generalized 03/12/2017  . Abdominal pain 03/12/2017  . Sepsis ([place]) 02/24/2017  . Chronic anticoagulation 02/23/2017  . Fever 02/23/2017  . Dizziness 02/05/2017  . Portacath in place 10/18/2016  . Goals of care, counseling/discussion 09/08/2016  . Pancreatic carcinoma metastatic to liver ([place]) 08/29/2016  . Mid back pain 07/07/2016  . Incisional hernia 06/30/2016  . Edema extremities 03/07/2016  . Pain of upper abdomen 01/27/2016  . Depression 01/28/2015  . Bile duct stone   . Elevated LFTs   . Benign essential HTN 09/23/2014  . Routine general medical examination at a health care facility 08/01/2014  . OSA (obstructive sleep apnea) 07/04/2014  . Dyslipidemia 03/09/2014  . Diastolic heart failure, stage B ([place])   . GERD (gastroesophageal reflux disease)   . Meralgia paresthetica of right side 10/02/2012  . Osteoarthritis of left  knee 09/12/2012  . Migraines 09/12/2012  . Obesity, Class III, BMI 40-49.9 (morbid obesity) ([place]) 09/12/2012    Past Surgical History:  Procedure Laterality Date  . ABDOMINAL HYSTERECTOMY  1/04   partial  . BLADDER SUSPENSION  6/10  . CARDIAC CATHETERIZATION    . carpel tunnel left/right  7/08, 8/08 Bilateral 8/08 and 7/08  . carpel tunnel rel Right 4/12  . CHOLECYSTECTOMY N/A 06/11/2014   Procedure: LAPAROSCOPIC CHOLECYSTECTOMY WITH ATTEMPTED INTRAOPERATIVE CHOLANGIOGRAM;  Surgeon: Jackolyn Confer, MD;  Location: [facility];  Service: General;  Laterality: N/A;  . COLONOSCOPY    . ERCP N/A 10/19/2014   Procedure: ENDOSCOPIC  RETROGRADE CHOLANGIOPANCREATOGRAPHY (ERCP);  Surgeon: Ladene Artist, MD;  Location: [facility];  Service: Endoscopy;  Laterality: N/A;  . INCISIONAL HERNIA REPAIR N/A 06/30/2016   Procedure: LAPAROSCOPIC REPAIR OF INCISIONAL HERNIA WITH MESH;  Surgeon: Jackolyn Confer, MD;  Location: [facility];  Service: General;  Laterality: N/A;  . INSERTION OF MESH N/A 06/30/2016   Procedure: INSERTION OF MESH;  Surgeon: Jackolyn Confer, MD;  Location: [facility];  Service: General;  Laterality: N/A;  . IR CV LINE INJECTION  11/24/2016  . IR FLUORO GUIDE PORT INSERTION RIGHT  08/28/2016  . IR US GUIDE VASC ACCESS RIGHT  08/28/2016  . JOINT REPLACEMENT    . KNEE ARTHROSCOPY Left 12/12  . KNEE ARTHROSCOPY Right 12/06  . LEFT HEART CATHETERIZATION WITH CORONARY ANGIOGRAM N/A 03/10/2014   Procedure: LEFT HEART CATHETERIZATION WITH CORONARY ANGIOGRAM;  Surgeon: Sinclair Grooms, MD;  Location: [facility] CATH LAB;  Service: Cardiovascular;  Laterality: N/A;  . PLANTAR FASCIA RELEASE Right 12/10  . radial tunnel release     right arm   . ROTATOR CUFF REPAIR Left 6/11  . tennis elbow release Right 7/04  . TOTAL KNEE ARTHROPLASTY Left 09/10/2012   Procedure: TOTAL KNEE ARTHROPLASTY- left;  Surgeon: Garald Balding, MD;  Location: [facility];  Service: Orthopedics;  Laterality: Left;  Left total knee arthroplasty    OB History    No data available       Home Medications    Prior to Admission medications   Medication Sig Start Date End Date Taking? Authorizing Provider  acetaminophen (TYLENOL) 650 MG CR tablet Take 1,300 mg by mouth every 6 (six) hours as needed for pain.    Yes [provider]  aspirin EC 81 MG tablet Take 81 mg by mouth daily.   Yes [provider]  citalopram (CELEXA) 20 MG tablet TAKE ONE (1) TABLET BY MOUTH EVERY DAY 01/22/17  Yes Hoyt Koch, MD  Cranberry-Vitamin C-Vitamin E (CRANBERRY PLUS VITAMIN C PO) Take 1 tablet by mouth daily. 4200 mg   Yes [provider]    cyclobenzaprine (FLEXERIL) 5 MG tablet Take 1 tablet (5 mg total) by mouth 3 (three) times daily as needed for muscle spasms. 03/19/17  Yes Ladell Pier, MD  eletriptan (RELPAX) 40 MG tablet Take 1 tablet (40 mg total) by mouth every 2 (two) hours as needed for migraine. 06/26/16  Yes Dennie Bible, NP  HYDROcodone-acetaminophen (NORCO/VICODIN) 5-325 MG tablet Take 1 tablet by mouth every 6 (six) hours as needed for moderate pain.   Yes [provider]  HYDROmorphone (DILAUDID) 4 MG tablet Take 0.5 tablets (2 mg total) by mouth every 4 (four) hours as needed for severe pain. Patient taking differently: Take 4-8 mg by mouth every 4 (four) hours as needed for severe pain.  03/14/17  Yes Aileen Fass,  Tammi Klippel, MD  hydrOXYzine (ATARAX/VISTARIL) 10 MG tablet Take 10 mg by mouth every 8 (eight) hours as needed for itching.   Yes [provider]  insulin lispro (HUMALOG) 100 UNIT/ML injection Inject 2-10 Units into the skin. Sliding Scale   Yes [provider]  lidocaine-prilocaine (EMLA) cream APPLY 1 APPLICATION TOPICALLY AS NEEDED.APPLY TO PORT A CATH SITE 1 HOUR PRIOR TO NEEDLE STICK. 03/26/17  Yes Ladell Pier, MD  loratadine (CLARITIN) 10 MG tablet Take 10-40 mg by mouth daily as needed for allergies.    Yes [provider]  LORazepam (ATIVAN) 0.5 MG tablet Take 1 tablet (0.5 mg total) by mouth every 8 (eight) hours as needed (or nausea). Patient taking differently: Take 0.5 mg by mouth every 8 (eight) hours as needed for sleep (nausea). Begin immediately after chemo 04/11/17  Yes Ladell Pier, MD  LYRICA 200 MG capsule Take 200 mg by mouth daily. 04/03/17  Yes [provider]  morphine (MS CONTIN) 30 MG 12 hr tablet Take 30 mg by mouth every 12 (twelve) hours. 03/20/17  Yes [provider]  Multiple Vitamin (MULTIVITAMIN WITH MINERALS) TABS tablet Take 1 tablet by mouth daily.   Yes [provider]  multivitamin-lutein  (OCUVITE-LUTEIN) CAPS capsule Take 1 capsule by mouth daily.   Yes [provider]  ondansetron (ZOFRAN-ODT) 8 MG disintegrating tablet Take 1 tablet (8 mg total) by mouth every 8 (eight) hours as needed for nausea or vomiting. 09/18/16  Yes Curcio, Roselie Awkward, NP  oxybutynin (DITROPAN-XL) 10 MG 24 hr tablet Take 1 tablet (10 mg total) by mouth every morning. 03/19/17  Yes Ladell Pier, MD  oxyCODONE (OXY IR/ROXICODONE) 5 MG immediate release tablet TAKE 1 TABLET BY MOUTH EVERY 4 HOURS AS NEEDED FOR SEVERE PAIN 04/03/17  Yes Owens Shark, NP  pantoprazole (PROTONIX) 40 MG tablet TAKE ONE (1) TABLET BY MOUTH TWO (2) TIMES DAILY 09/25/16  Yes Ladene Artist, MD  prochlorperazine (COMPAZINE) 10 MG tablet Take 10 mg by mouth every 6 (six) hours as needed for nausea or vomiting.   Yes [provider]  simethicone (MYLICON) 591 MG chewable tablet Chew 125-250 mg by mouth 2 (two) times daily as needed for flatulence.   Yes [provider]  vitamin B-12 (CYANOCOBALAMIN) 1000 MCG tablet Take 2,500 mcg by mouth every morning.   Yes [provider]  XARELTO 20 MG TABS tablet Take 20 mg daily by mouth. 01/31/17  Yes [provider]  cephALEXin (KEFLEX) 500 MG capsule Take 1 capsule (500 mg total) by mouth 2 (two) times daily. Patient not taking: Reported on 04/13/2017 03/16/17   Ladell Pier, MD  ciprofloxacin (CIPRO) 250 MG tablet Take 1 tablet (250 mg total) by mouth 2 (two) times daily. Take for 5 days. Patient not taking: Reported on 04/13/2017 03/30/17   Owens Shark, NP  nitrofurantoin, macrocrystal-monohydrate, (MACROBID) 100 MG capsule Take 1 capsule (100 mg total) by mouth daily. Begin after the course of Cipro has been completed. Patient not taking: Reported on 04/13/2017 04/04/17   Owens Shark, NP  nitroGLYCERIN (NITROSTAT) 0.4 MG SL tablet Place 1 tablet (0.4 mg total) under the tongue every 5 (five) minutes as needed for chest pain. Patient not taking:  Reported on 04/13/2017 06/28/15   Belva Crome, MD  polyethylene glycol [facility] / Floria Raveling) packet Take 17 g by mouth daily as needed for mild constipation or moderate constipation. Patient not taking: Reported on 04/13/2017 02/24/17  Robbie Lis, MD    Family History Family History  Problem Relation Age of Onset  . Hypertension Mother   . Stroke Mother   . Liver disease Mother        Abcess  . Hypertension Father   . Coronary artery disease Father   . Migraines Father   . Clotting disorder Father   . Kidney failure Brother   . Hypertension Brother   . Migraines Brother   . Migraines Daughter   . Breast cancer Other        Niece with breast cancer  . Colon cancer Paternal Grandmother   . Pancreatic cancer Paternal Grandmother   . Stomach cancer Paternal Grandmother   . Breast cancer Cousin   . Esophageal cancer Neg Hx   . Rectal cancer Neg Hx     Social History Social History   Tobacco Use  . Smoking status: Never Smoker  . Smokeless tobacco: Never Used  . Tobacco comment: Secondhand - from family growing up, workplace intermittently  Substance Use Topics  . Alcohol use: Yes    Alcohol/week: 0.0 oz    Comment: occasionally - intermittent, no more than twice a week  . Drug use: No     Allergies   Topamax [topiramate]; Aleve [naproxen sodium]; Bee venom; Echinacea; Other; Sulfa antibiotics; and Advil [ibuprofen]   Review of Systems Review of Systems  All other systems reviewed and are negative.    Physical Exam Updated Vital Signs BP 110/62 (BP Location: Right Arm)   Pulse 93   Temp 98.2 F (36.8 C) (Oral)   Resp 16   SpO2 97%   Physical Exam  Constitutional: She is oriented to person, place, and time. She appears well-developed and well-nourished.  Uncomfortable appearing  HENT:  Head: Normocephalic and atraumatic.  Cardiovascular: Regular rhythm.  No murmur heard. Tachycardic  Pulmonary/Chest: Effort normal and breath sounds normal. No  respiratory distress.  Abdominal: Soft. There is no rebound and no guarding.  Mild epigastric tenderness.  No right upper quadrant or CVA tenderness.  Musculoskeletal: She exhibits no edema or tenderness.  Neurological: She is alert and oriented to person, place, and time.  Skin: Skin is warm and dry.  Psychiatric: She has a normal mood and affect. Her behavior is normal.  Nursing note and vitals reviewed.    ED Treatments / Results  Labs (all labs ordered are listed, but only abnormal results are displayed) Labs Reviewed  CBC - Abnormal; Notable for the following components:      Result Value   WBC 23.8 (*)    RBC 3.16 (*)    Hemoglobin 8.7 (*)    HCT 27.3 (*)    RDW 18.7 (*)    Platelets 136 (*)    All other components within normal limits  COMPREHENSIVE METABOLIC PANEL - Abnormal; Notable for the following components:   Sodium 133 (*)    Glucose, Bld 146 (*)    Calcium 8.3 (*)    Total Protein 6.0 (*)    Albumin 2.6 (*)    Alkaline Phosphatase 166 (*)    All other components within normal limits  URINALYSIS, ROUTINE W REFLEX MICROSCOPIC - Abnormal; Notable for the following components:   APPearance HAZY (*)    Specific Gravity, Urine 1.032 (*)    Ketones, ur 20 (*)    Leukocytes, UA TRACE (*)    All other components within normal limits  I-STAT CG4 LACTIC ACID, ED - Abnormal; Notable for the following components:  Lactic Acid, Venous 2.60 (*)    All other components within normal limits  CBG MONITORING, ED - Abnormal; Notable for the following components:   Glucose-Capillary 138 (*)    All other components within normal limits  CBG MONITORING, ED - Abnormal; Notable for the following components:   Glucose-Capillary 131 (*)    All other components within normal limits  URINE CULTURE  CULTURE, BLOOD (ROUTINE X 2)  CULTURE, BLOOD (ROUTINE X 2)  LIPASE, BLOOD  TROPONIN I  TROPONIN I  I-STAT TROPONIN, ED  I-STAT CG4 LACTIC ACID, ED    EKG  EKG  Interpretation  Date/Time:  Friday April 13 2017 14:02:49 EST Ventricular Rate:  91 PR Interval:    QRS Duration: 77 QT Interval:  365 QTC Calculation: 450 R Axis:   78 Text Interpretation:  Sinus rhythm Low voltage, precordial leads Confirmed by Quintella Reichert [phone]) on 04/13/2017 3:22:19 PM       Radiology Dg Chest 2 View  Result Date: 04/13/2017 CLINICAL DATA:  Shortness of breath. EXAM: CHEST  2 VIEW COMPARISON:  Radiograph of February 23, 2017. FINDINGS: Stable cardiomediastinal silhouette. No pneumothorax is noted. Elevated right hemidiaphragm is noted with mild right basilar subsegmental atelectasis. Right internal jugular Port-A-Cath is noted with distal tip in expected position of the SVC. Left lung is clear. No significant pleural effusion is noted. Bony thorax is unremarkable. IMPRESSION: Elevated right hemidiaphragm with mild right basilar subsegmental atelectasis. No other significant abnormality is noted. Electronically Signed   By: Marijo Conception, M.D.   On: 04/13/2017 14:56   Ct Angio Chest Pe W/cm &/or Wo Cm  Result Date: 04/13/2017 CLINICAL DATA:  Pancreatic carcinoma. Referral cancer center for hypotension and malaise. Chills and nausea and vomiting. Chronic RIGHT-sided abdominal pain. EXAM: CT ANGIOGRAPHY CHEST CT ABDOMEN AND PELVIS WITH CONTRAST TECHNIQUE: Multidetector CT imaging of the chest was performed using the standard protocol during bolus administration of intravenous contrast. Multiplanar CT image reconstructions and MIPs were obtained to evaluate the vascular anatomy. Multidetector CT imaging of the abdomen and pelvis was performed using the standard protocol during bolus administration of intravenous contrast. CONTRAST:  149mL ISOVUE-370 IOPAMIDOL (ISOVUE-370) INJECTION 76% COMPARISON:  CT chest abdomen pelvis 03/11/2017 FINDINGS: CTA CHEST FINDINGS Cardiovascular: No filling defects within the pulmonary arteries arteries to suggest acute pulmonary embolism.  No significant vascular findings. Normal heart size. No pericardial effusion. Mediastinum/Nodes: No axillary supraclavicular adenopathy. Port in the RIGHT chest wall with tip in distal SVC. No mediastinal hilar adenopathy. No pericardial fluid. Esophagus normal. Lungs/Pleura: 6 mm RIGHT upper lobe pulmonary nodule is not changed compared to 03/11/2017. Lesions increased in size from 12/06/2016 no new pulmonary nodules. Musculoskeletal: No aggressive osseous lesion Review of the MIP images confirms the above findings. CT ABDOMEN and PELVIS FINDINGS Hepatobiliary: Multiple round low-density lesions in liver parenchyma consistent with hepatic metastasis are not changed in short interval from 03/11/2017. No new lesions. Postcholecystectomy. Pancreas: Lesion head of pancreas measures smaller at 3.7 by 4.9 cm compared to 4.5 x 5.2 cm. There is atrophy duct dilatation the body and tail the pancreas. Spleen: Geographic low attenuation within the medial aspect the spleen is likely a profusion phenomena (image 34, series 4). Adrenals/urinary tract: Adrenal glands and kidneys are normal. The ureters and bladder normal. Stomach/Bowel: Stomach, small bowel, appendix, and cecum are normal. The colon and rectosigmoid colon are normal. Vascular/Lymphatic: Abdominal aorta is normal caliber. There is no retroperitoneal or periportal lymphadenopathy. No pelvic lymphadenopathy. Reproductive: Post hysterectomy. Other: No  peritoneal nodularity Musculoskeletal: No aggressive osseous lesion. Review of the MIP images confirms the above findings. IMPRESSION: Chest Impression: 1. No evidence acute pulmonary embolism. 2. Suspicious RIGHT upper lobe pulmonary nodule not changed in short interval from 03/11/2017. Abdomen / Pelvis Impression: 1. Interval decrease in size of pancreatic mass in short interval follow-up. 2. Stable bilobed hepatic metastasis. 3. No peritoneal metastatic disease or progressive adenopathy. Electronically Signed   By:  Suzy Bouchard M.D.   On: 04/13/2017 17:55   Ct Abdomen Pelvis W Contrast  Result Date: 04/13/2017 CLINICAL DATA:  Pancreatic carcinoma. Referral cancer center for hypotension and malaise. Chills and nausea and vomiting. Chronic RIGHT-sided abdominal pain. EXAM: CT ANGIOGRAPHY CHEST CT ABDOMEN AND PELVIS WITH CONTRAST TECHNIQUE: Multidetector CT imaging of the chest was performed using the standard protocol during bolus administration of intravenous contrast. Multiplanar CT image reconstructions and MIPs were obtained to evaluate the vascular anatomy. Multidetector CT imaging of the abdomen and pelvis was performed using the standard protocol during bolus administration of intravenous contrast. CONTRAST:  119mL ISOVUE-370 IOPAMIDOL (ISOVUE-370) INJECTION 76% COMPARISON:  CT chest abdomen pelvis 03/11/2017 FINDINGS: CTA CHEST FINDINGS Cardiovascular: No filling defects within the pulmonary arteries arteries to suggest acute pulmonary embolism. No significant vascular findings. Normal heart size. No pericardial effusion. Mediastinum/Nodes: No axillary supraclavicular adenopathy. Port in the RIGHT chest wall with tip in distal SVC. No mediastinal hilar adenopathy. No pericardial fluid. Esophagus normal. Lungs/Pleura: 6 mm RIGHT upper lobe pulmonary nodule is not changed compared to 03/11/2017. Lesions increased in size from 12/06/2016 no new pulmonary nodules. Musculoskeletal: No aggressive osseous lesion Review of the MIP images confirms the above findings. CT ABDOMEN and PELVIS FINDINGS Hepatobiliary: Multiple round low-density lesions in liver parenchyma consistent with hepatic metastasis are not changed in short interval from 03/11/2017. No new lesions. Postcholecystectomy. Pancreas: Lesion head of pancreas measures smaller at 3.7 by 4.9 cm compared to 4.5 x 5.2 cm. There is atrophy duct dilatation the body and tail the pancreas. Spleen: Geographic low attenuation within the medial aspect the spleen is likely  a profusion phenomena (image 34, series 4). Adrenals/urinary tract: Adrenal glands and kidneys are normal. The ureters and bladder normal. Stomach/Bowel: Stomach, small bowel, appendix, and cecum are normal. The colon and rectosigmoid colon are normal. Vascular/Lymphatic: Abdominal aorta is normal caliber. There is no retroperitoneal or periportal lymphadenopathy. No pelvic lymphadenopathy. Reproductive: Post hysterectomy. Other: No peritoneal nodularity Musculoskeletal: No aggressive osseous lesion. Review of the MIP images confirms the above findings. IMPRESSION: Chest Impression: 1. No evidence acute pulmonary embolism. 2. Suspicious RIGHT upper lobe pulmonary nodule not changed in short interval from 03/11/2017. Abdomen / Pelvis Impression: 1. Interval decrease in size of pancreatic mass in short interval follow-up. 2. Stable bilobed hepatic metastasis. 3. No peritoneal metastatic disease or progressive adenopathy. Electronically Signed   By: Suzy Bouchard M.D.   On: 04/13/2017 17:55    Procedures Procedures (including critical care time)  Medications Ordered in ED Medications  aspirin EC tablet 81 mg (81 mg Oral Given 04/13/17 2310)  citalopram (CELEXA) tablet 20 mg (20 mg Oral Given 04/13/17 2307)  cyclobenzaprine (FLEXERIL) tablet 5 mg (not administered)  eletriptan (RELPAX) tablet 40 mg (not administered)  rivaroxaban (XARELTO) tablet 20 mg (20 mg Oral Given 04/13/17 2307)  simethicone (MYLICON) chewable tablet 80 mg (not administered)  polyethylene glycol (MIRALAX / GLYCOLAX) packet 17 g (not administered)  pantoprazole (PROTONIX) EC tablet 40 mg (40 mg Oral Given 04/13/17 2309)  oxybutynin (DITROPAN-XL) 24 hr tablet 10  mg (not administered)  lidocaine-prilocaine (EMLA) cream (not administered)  hydrOXYzine (ATARAX/VISTARIL) tablet 10 mg (not administered)  HYDROmorphone (DILAUDID) tablet 4-8 mg (not administered)  LORazepam (ATIVAN) tablet 0.5 mg (not administered)  pregabalin (LYRICA)  capsule 200 mg (200 mg Oral Given 04/13/17 2309)  morphine (MS CONTIN) 12 hr tablet 30 mg (30 mg Oral Given 04/13/17 2308)  oxyCODONE (Oxy IR/ROXICODONE) immediate release tablet 5 mg (not administered)  acetaminophen (TYLENOL) tablet 650 mg (not administered)    Or  acetaminophen (TYLENOL) suppository 650 mg (not administered)  ondansetron (ZOFRAN) tablet 4 mg (not administered)    Or  ondansetron (ZOFRAN) injection 4 mg (not administered)  ipratropium-albuterol (DUONEB) 0.5-2.5 (3) MG/3ML nebulizer solution 3 mL (not administered)  vancomycin (VANCOCIN) 1,250 mg in sodium chloride 0.9 % 250 mL IVPB (not administered)  piperacillin-tazobactam (ZOSYN) IVPB 3.375 g (3.375 g Intravenous Not Given 04/14/17 0423)  fentaNYL (SUBLIMAZE) injection 50 mcg (50 mcg Intravenous Given 04/13/17 1407)  sodium chloride 0.9 % bolus 1,000 mL (0 mLs Intravenous Stopped 04/13/17 1521)  sodium chloride 0.9 % bolus 1,000 mL (0 mLs Intravenous Stopped 04/13/17 1648)  morphine 4 MG/ML injection 4 mg (4 mg Intravenous Given 04/13/17 1648)  pantoprazole (PROTONIX) injection 40 mg (40 mg Intravenous Given 04/13/17 1648)  iopamidol (ISOVUE-370) 76 % injection 100 mL (100 mLs Intravenous Contrast Given 04/13/17 1654)  LORazepam (ATIVAN) injection 1 mg (1 mg Intravenous Given 04/13/17 2144)  0.9 %  sodium chloride infusion ( Intravenous Stopped 04/14/17 0438)  piperacillin-tazobactam (ZOSYN) IVPB 3.375 g (0 g Intravenous Stopped 04/13/17 2347)  vancomycin (VANCOCIN) 2,500 mg in sodium chloride 0.9 % 500 mL IVPB (0 mg Intravenous Stopped 04/14/17 0209)  0.9 %  sodium chloride infusion ( Intravenous New Bag/Given 04/14/17 0439)     Initial Impression / Assessment and Plan / ED Course  I have reviewed the triage vital signs and the nursing notes.  Pertinent labs & imaging results that were available during my care of the patient were reviewed by me and considered in my medical decision making (see chart for details).  Clinical  Course as of Apr 14 656  Fri Apr 13, 2017  1754 Patient has had improvement since being treated with pain medications.  She is now able to sit back in the stretcher.  She denies need for additional pain medications at this time.  Updated on awaiting results of CTs.  [MP]    Clinical Course User Index [MP] Charlesetta Shanks, MD    Patient with pancreatic cancer undergoing chemotherapy here for evaluation of diaphoresis, nausea, chills.  She did have hypotension in the cancer center prior to ED arrival that did improve with IV fluids.  She has a leukocytosis on her CBC but did receive a Neulasta injection 2 days ago.  She has no fevers or no clear evidence of infection at this time and presentation is currently not consistent with sepsis.  Patient care transferred pending reassessment after IV fluids and urinalysis.    Final Clinical Impressions(s) / ED Diagnoses   Final diagnoses:  None    ED Discharge Orders    None       Quintella Reichert, MD 04/13/17 [place], [place], MD 04/14/17 [phone]

## 2017-04-13 NOTE — Telephone Encounter (Signed)
Spoke with husband. Reports pt is "sweating profusely, very weak, cold, dizzy". Reports that temperature is around 98 and blood sugar is 145. Pt scheduled to see Heather Frederick in [facility]. En route.

## 2017-04-13 NOTE — H&P (Addendum)
History and Physical    Heather Frederick ACZ:[id number] DOB: 14-Jul-1955 DOA: 04/13/2017  Referring MD/NP/PA: Dr. Jeannie Done Phiffer PCP: [name], MD  Patient coming from: oncology clinic  Chief Complaint: Weakness  I have personally briefly reviewed patient's old medical records in [place]   HPI: Heather Frederick is a 62 y.o. female with medical history significant of metastatic pancreatic cancer with liver involvement on chemotherapy, history of DVT requiring anticoagulation; who presented from oncology clinic with complaints of generalized weakness and malaise.  Symptoms acutely started last night with the patient noted to be sweating profusely chills and dizzy.  Associated symptoms included nausea, shortness of breath, chronic right-sided abdominal pain.  She was seen in the oncology clinic today and noted to be in some distress with mild tachycardia and hypotension with blood pressures noted to be as low as 86/53.  She was sent to the emergency department for further evaluation. Post cycle 5 of FOLFIRINOX for which the husband notes patient started the treatment on 04/09/2017 and normally receives a infusion of medication with a pump connected to her port for 46 hours and the pump was disconnected 2 days ago.  He reports that she has had similar episodes like this in the past at least 3-4 times.  He reports sometimes they found an infection such as a urinary tract infection and sometimes no infection.  The patient had recently been treated for urinary tract infection per the patient's husband in the last 1-2 weeks.  She is scheduled to follow-up with Dr. Ladell Pier on 04/24/2017.  ED Course: Upon admission into the emergency department patient was noted to be afebrile, pulse 86-106, respirations 20-29, blood pressure 86/53 to 130/71, and O2 saturations 95-100% on room air.  Labs revealed WBC 29.5, hemoglobin 10.2, platelets 165, initial troponin 0, lactic acid 2.6.  Chest x-ray showed an  elevated right hemidiaphragm with signs of atelectasis.  CT scan of the chest was obtained due to patient complaining of centralized chest pain, but did not reveal signs of a PE although noted to show interval decrease growth of the pancreatic mass.  Patient was given 2 L of normal saline IV fluids, pain medication, and pain medication.  Symptoms thought to be possibly secondary to recent infusion.  TRH called to admit no antibiotics given.   Review of Systems  Constitutional: Positive for chills, diaphoresis and malaise/fatigue. Negative for fever.  HENT: Negative for congestion and nosebleeds.   Eyes: Negative for photophobia and pain.  Respiratory: Positive for shortness of breath. Negative for cough.   Cardiovascular: Positive for chest pain. Negative for leg swelling.  Gastrointestinal: Positive for abdominal pain and nausea. Negative for diarrhea.  Genitourinary: Negative for dysuria and urgency.  Musculoskeletal: Positive for myalgias. Negative for falls.  Skin: Negative for itching and rash.  Neurological: Positive for dizziness. Negative for focal weakness, seizures and headaches.  Psychiatric/Behavioral: Negative for memory loss and substance abuse.    Past Medical History:  Diagnosis Date  . Allergy   . Anxiety   . Arthritis   . Benign essential HTN 09/23/2014  . Cancer ([place])   . Chronic kidney disease    uti  . Depression   . Dyslipidemia   . Elevated liver enzymes   . Family history of anesthesia complication    father has a severe hard time waking up  . Gallstones    a. Seen on CT 01/2014.  Marland Kitchen GERD (gastroesophageal reflux disease)   . Hard of hearing   .  Hepatic steatosis   . History of frequent urinary tract infections   . Hyperlipidemia   . Hypertension   . Lateral epicondylitis of right elbow   . Mental disorder   . Meralgia paresthetica of right side 10/02/2012   slight at 05/2014  . Migraine headache   . Obesity   . OSA (obstructive sleep apnea)    severe  with AHI 37/hr now on CPAP at 18cm H2O  . Osteoarthritis   . Pneumonia    Feb 2018  . Raynaud disease    in feet per patient   . Sepsis ([place]) 02/23/2017  . Sleep apnea    wears c-pap  . Urinary tract infection     Past Surgical History:  Procedure Laterality Date  . ABDOMINAL HYSTERECTOMY  1/04   partial  . BLADDER SUSPENSION  6/10  . CARDIAC CATHETERIZATION    . carpel tunnel left/right  7/08, 8/08 Bilateral 8/08 and 7/08  . carpel tunnel rel Right 4/12  . CHOLECYSTECTOMY N/A 06/11/2014   Procedure: LAPAROSCOPIC CHOLECYSTECTOMY WITH ATTEMPTED INTRAOPERATIVE CHOLANGIOGRAM;  Surgeon: Jackolyn Confer, MD;  Location: [facility];  Service: General;  Laterality: N/A;  . COLONOSCOPY    . ERCP N/A 10/19/2014   Procedure: ENDOSCOPIC RETROGRADE CHOLANGIOPANCREATOGRAPHY (ERCP);  Surgeon: Ladene Artist, MD;  Location: [facility];  Service: Endoscopy;  Laterality: N/A;  . INCISIONAL HERNIA REPAIR N/A 06/30/2016   Procedure: LAPAROSCOPIC REPAIR OF INCISIONAL HERNIA WITH MESH;  Surgeon: Jackolyn Confer, MD;  Location: [facility];  Service: General;  Laterality: N/A;  . INSERTION OF MESH N/A 06/30/2016   Procedure: INSERTION OF MESH;  Surgeon: Jackolyn Confer, MD;  Location: [facility];  Service: General;  Laterality: N/A;  . IR CV LINE INJECTION  11/24/2016  . IR FLUORO GUIDE PORT INSERTION RIGHT  08/28/2016  . IR US GUIDE VASC ACCESS RIGHT  08/28/2016  . JOINT REPLACEMENT    . KNEE ARTHROSCOPY Left 12/12  . KNEE ARTHROSCOPY Right 12/06  . LEFT HEART CATHETERIZATION WITH CORONARY ANGIOGRAM N/A 03/10/2014   Procedure: LEFT HEART CATHETERIZATION WITH CORONARY ANGIOGRAM;  Surgeon: Sinclair Grooms, MD;  Location: [facility];  Service: Cardiovascular;  Laterality: N/A;  . PLANTAR FASCIA RELEASE Right 12/10  . radial tunnel release     right arm   . ROTATOR CUFF REPAIR Left 6/11  . tennis elbow release Right 7/04  . TOTAL KNEE ARTHROPLASTY Left 09/10/2012   Procedure: TOTAL KNEE ARTHROPLASTY- left;  Surgeon:  Garald Balding, MD;  Location: [facility];  Service: Orthopedics;  Laterality: Left;  Left total knee arthroplasty     reports that  has never smoked. she has never used smokeless tobacco. She reports that she drinks alcohol. She reports that she does not use drugs.  Allergies  Allergen Reactions  . Topamax [Topiramate] Other (See Comments)    :Stroke like symptoms  . Aleve [Naproxen Sodium] Hives    Has tolerated Voltaren topical as well as aspirin.  . Bee Venom Swelling  . Echinacea Hives  . Other Other (See Comments)    Feathers cause sinus congestion  . Sulfa Antibiotics Hives  . Advil [Ibuprofen] Hives    Has tolerated Voltaren topical as well as aspirin.    Family History  Problem Relation Age of Onset  . Hypertension Mother   . Stroke Mother   . Liver disease Mother        Abcess  . Hypertension Father   . Coronary artery disease Father   . Migraines  Father   . Clotting disorder Father   . Kidney failure Brother   . Hypertension Brother   . Migraines Brother   . Migraines Daughter   . Breast cancer Other        Niece with breast cancer  . Colon cancer Paternal Grandmother   . Pancreatic cancer Paternal Grandmother   . Stomach cancer Paternal Grandmother   . Breast cancer Cousin   . Esophageal cancer Neg Hx   . Rectal cancer Neg Hx     Prior to Admission medications   Medication Sig Start Date End Date Taking? Authorizing Provider  acetaminophen (TYLENOL) 650 MG CR tablet Take 1,300 mg by mouth every 6 (six) hours as needed for pain.    Yes [provider]  aspirin EC 81 MG tablet Take 81 mg by mouth daily.   Yes [provider]  citalopram (CELEXA) 20 MG tablet TAKE ONE (1) TABLET BY MOUTH EVERY DAY 01/22/17  Yes Hoyt Koch, MD  Cranberry-Vitamin C-Vitamin E (CRANBERRY PLUS VITAMIN C PO) Take 1 tablet by mouth daily. 4200 mg   Yes [provider]  cyclobenzaprine (FLEXERIL) 5 MG tablet Take 1 tablet (5 mg total) by mouth 3  (three) times daily as needed for muscle spasms. 03/19/17  Yes Ladell Pier, MD  eletriptan (RELPAX) 40 MG tablet Take 1 tablet (40 mg total) by mouth every 2 (two) hours as needed for migraine. 06/26/16  Yes Dennie Bible, NP  HYDROcodone-acetaminophen (NORCO/VICODIN) 5-325 MG tablet Take 1 tablet by mouth every 6 (six) hours as needed for moderate pain.   Yes [provider]  HYDROmorphone (DILAUDID) 4 MG tablet Take 0.5 tablets (2 mg total) by mouth every 4 (four) hours as needed for severe pain. Patient taking differently: Take 4-8 mg by mouth every 4 (four) hours as needed for severe pain.  03/14/17  Yes Charlynne Cousins, MD  hydrOXYzine (ATARAX/VISTARIL) 10 MG tablet Take 10 mg by mouth every 8 (eight) hours as needed for itching.   Yes [provider]  insulin lispro (HUMALOG) 100 UNIT/ML injection Inject 2-10 Units into the skin. Sliding Scale   Yes [provider]  lidocaine-prilocaine (EMLA) cream APPLY 1 APPLICATION TOPICALLY AS NEEDED.APPLY TO PORT A CATH SITE 1 HOUR PRIOR TO NEEDLE STICK. 03/26/17  Yes Ladell Pier, MD  loratadine (CLARITIN) 10 MG tablet Take 10-40 mg by mouth daily as needed for allergies.    Yes [provider]  LORazepam (ATIVAN) 0.5 MG tablet Take 1 tablet (0.5 mg total) by mouth every 8 (eight) hours as needed (or nausea). Patient taking differently: Take 0.5 mg by mouth every 8 (eight) hours as needed for sleep (nausea). Begin immediately after chemo 04/11/17  Yes Ladell Pier, MD  LYRICA 200 MG capsule Take 200 mg by mouth daily. 04/03/17  Yes [provider]  morphine (MS CONTIN) 30 MG 12 hr tablet Take 30 mg by mouth every 12 (twelve) hours. 03/20/17  Yes [provider]  Multiple Vitamin (MULTIVITAMIN WITH MINERALS) TABS tablet Take 1 tablet by mouth daily.   Yes [provider]  multivitamin-lutein (OCUVITE-LUTEIN) CAPS capsule Take 1 capsule by mouth daily.   Yes [provider]  ondansetron (ZOFRAN-ODT) 8 MG disintegrating tablet Take 1 tablet (8 mg total) by mouth every 8 (eight) hours as needed for nausea or vomiting. 09/18/16  Yes Curcio, Roselie Awkward, NP  oxybutynin (DITROPAN-XL) 10 MG 24 hr tablet Take 1 tablet (10 mg total) by  mouth every morning. 03/19/17  Yes Ladell Pier, MD  oxyCODONE (OXY IR/ROXICODONE) 5 MG immediate release tablet TAKE 1 TABLET BY MOUTH EVERY 4 HOURS AS NEEDED FOR SEVERE PAIN 04/03/17  Yes Owens Shark, NP  pantoprazole (PROTONIX) 40 MG tablet TAKE ONE (1) TABLET BY MOUTH TWO (2) TIMES DAILY 09/25/16  Yes Ladene Artist, MD  prochlorperazine (COMPAZINE) 10 MG tablet Take 10 mg by mouth every 6 (six) hours as needed for nausea or vomiting.   Yes [provider]  simethicone (MYLICON) 630 MG chewable tablet Chew 125-250 mg by mouth 2 (two) times daily as needed for flatulence.   Yes [provider]  vitamin B-12 (CYANOCOBALAMIN) 1000 MCG tablet Take 2,500 mcg by mouth every morning.   Yes [provider]  XARELTO 20 MG TABS tablet Take 20 mg daily by mouth. 01/31/17  Yes [provider]  cephALEXin (KEFLEX) 500 MG capsule Take 1 capsule (500 mg total) by mouth 2 (two) times daily. Patient not taking: Reported on 04/13/2017 03/16/17   Ladell Pier, MD  ciprofloxacin (CIPRO) 250 MG tablet Take 1 tablet (250 mg total) by mouth 2 (two) times daily. Take for 5 days. Patient not taking: Reported on 04/13/2017 03/30/17   Owens Shark, NP  nitrofurantoin, macrocrystal-monohydrate, (MACROBID) 100 MG capsule Take 1 capsule (100 mg total) by mouth daily. Begin after the course of Cipro has been completed. Patient not taking: Reported on 04/13/2017 04/04/17   Owens Shark, NP  nitroGLYCERIN (NITROSTAT) 0.4 MG SL tablet Place 1 tablet (0.4 mg total) under the tongue every 5 (five) minutes as needed for chest pain. Patient not taking: Reported on 04/13/2017 06/28/15   Belva Crome, MD  polyethylene glycol  [facility] Floria Raveling) packet Take 17 g by mouth daily as needed for mild constipation or moderate constipation. Patient not taking: Reported on 04/13/2017 02/24/17   Robbie Lis, MD    Physical Exam:  Constitutional: Chronically ill female who appears lethargy, but   Vitals:   04/13/17 1725 04/13/17 1811 04/13/17 1830 04/13/17 1900  BP: (!) 118/54 124/63 (!) 129/57 (!) 112/54  Pulse: 88 (!) 102 (!) 103 (!) 106  Resp: (!) 26 (!) 23 (!) 25 (!) 29  Temp:      TempSrc:      SpO2: 98% 96% 95% 98%   Eyes: PERRL, lids and conjunctivae normal ENMT: Mucous membranes are dry. Posterior pharynx clear of any exudate or lesions.   Neck: normal, supple, no masses, no thyromegaly Respiratory: Tachypneic with decreased overall aeration no significant wheezes, rhonchi, orcrackles appreciated. Cardiovascular: Tachycardic, no murmurs / rubs / gallops. No extremity edema. 2+ pedal pulses. No carotid bruits.  Port of the right upper chest wall with no signs of erythema. Abdomen: Mild right sided tenderness, no masses palpated. No hepatosplenomegaly. Bowel sounds positive.  Musculoskeletal: no clubbing / cyanosis. No joint deformity upper and lower extremities. Good ROM, no contractures. Normal muscle tone.  Skin: Diaphoretic.  No rashes, lesions, ulcers. No induration Neurologic: CN 2-12 grossly intact.  Able to move all extremities Psychiatric: Normal judgment and insight.  Lethargic, but oriented x 3. Normal mood.     Labs on Admission: I have personally reviewed following labs and imaging studies  CBC: Recent Labs  Lab 04/09/17 0924 04/13/17 1219  WBC 10.6* 29.5*  NEUTROABS 7.8* 26.4*  HGB 10.0*  --   HCT 32.5* 31.7*  MCV 87.8 85.0  PLT 191 160   Basic Metabolic Panel: Recent  Labs  Lab 04/09/17 0924 04/13/17 1219  NA 137 133*  K 4.1 3.7  CL 103 98  CO2 24 21*  GLUCOSE 234* 139  BUN 8 11  CREATININE 0.86  --   CALCIUM 9.6 9.6  MG  --  1.6   GFR: Estimated Creatinine Clearance:  86.1 mL/min (by C-G formula based on SCr of 0.86 mg/dL). Liver Function Tests: Recent Labs  Lab 04/09/17 0924 04/13/17 1219  AST 26 27  ALT 18 29  ALKPHOS 169* 220*  BILITOT 0.7 1.4*  PROT 6.9 6.9  ALBUMIN 2.8* 2.8*   Recent Labs  Lab 04/13/17 1419  LIPASE 17   No results for input(s): AMMONIA in the last 168 hours. Coagulation Profile: No results for input(s): INR, PROTIME in the last 168 hours. Cardiac Enzymes: No results for input(s): CKTOTAL, CKMB, CKMBINDEX, TROPONINI in the last 168 hours. BNP (last 3 results) No results for input(s): PROBNP in the last 8760 hours. HbA1C: No results for input(s): HGBA1C in the last 72 hours. CBG: Recent Labs  Lab 04/13/17 1519 04/13/17 1557  GLUCAP 138* 131*   Lipid Profile: No results for input(s): CHOL, HDL, LDLCALC, TRIG, CHOLHDL, LDLDIRECT in the last 72 hours. Thyroid Function Tests: No results for input(s): TSH, T4TOTAL, FREET4, T3FREE, THYROIDAB in the last 72 hours. Anemia Panel: No results for input(s): VITAMINB12, FOLATE, FERRITIN, TIBC, IRON, RETICCTPCT in the last 72 hours. Urine analysis:    Component Value Date/Time   COLORURINE YELLOW 03/13/2017 0102   APPEARANCEUR CLEAR 03/13/2017 0102   LABSPEC 1.005 03/28/2017 1319   PHURINE 8.5 03/28/2017 1319   PHURINE 8.0 03/13/2017 0102   GLUCOSEU Negative 03/28/2017 1319   HGBUR Trace 03/28/2017 1319   HGBUR NEGATIVE 03/13/2017 0102   BILIRUBINUR Negative 03/28/2017 1319   KETONESUR Negative 03/28/2017 1319   KETONESUR 80 (A) 03/13/2017 0102   PROTEINUR 30 03/28/2017 1319   PROTEINUR 100 (A) 03/13/2017 0102   UROBILINOGEN 0.2 03/28/2017 1319   NITRITE Negative 03/28/2017 1319   NITRITE NEGATIVE 03/13/2017 0102   LEUKOCYTESUR Trace 03/28/2017 1319   Sepsis Labs: No results found for this or any previous visit (from the past 240 hour(s)).   Radiological Exams on Admission: Dg Chest 2 View  Result Date: 04/13/2017 CLINICAL DATA:  Shortness of breath. EXAM:  CHEST  2 VIEW COMPARISON:  Radiograph of February 23, 2017. FINDINGS: Stable cardiomediastinal silhouette. No pneumothorax is noted. Elevated right hemidiaphragm is noted with mild right basilar subsegmental atelectasis. Right internal jugular Port-A-Cath is noted with distal tip in expected position of the SVC. Left lung is clear. No significant pleural effusion is noted. Bony thorax is unremarkable. IMPRESSION: Elevated right hemidiaphragm with mild right basilar subsegmental atelectasis. No other significant abnormality is noted. Electronically Signed   By: Marijo Conception, M.D.   On: 04/13/2017 14:56   Ct Angio Chest Pe W/cm &/or Wo Cm  Result Date: 04/13/2017 CLINICAL DATA:  Pancreatic carcinoma. Referral cancer center for hypotension and malaise. Chills and nausea and vomiting. Chronic RIGHT-sided abdominal pain. EXAM: CT ANGIOGRAPHY CHEST CT ABDOMEN AND PELVIS WITH CONTRAST TECHNIQUE: Multidetector CT imaging of the chest was performed using the standard protocol during bolus administration of intravenous contrast. Multiplanar CT image reconstructions and MIPs were obtained to evaluate the vascular anatomy. Multidetector CT imaging of the abdomen and pelvis was performed using the standard protocol during bolus administration of intravenous contrast. CONTRAST:  188mL ISOVUE-370 IOPAMIDOL (ISOVUE-370) INJECTION 76% COMPARISON:  CT chest abdomen pelvis 03/11/2017 FINDINGS: CTA CHEST FINDINGS  Cardiovascular: No filling defects within the pulmonary arteries arteries to suggest acute pulmonary embolism. No significant vascular findings. Normal heart size. No pericardial effusion. Mediastinum/Nodes: No axillary supraclavicular adenopathy. Port in the RIGHT chest wall with tip in distal SVC. No mediastinal hilar adenopathy. No pericardial fluid. Esophagus normal. Lungs/Pleura: 6 mm RIGHT upper lobe pulmonary nodule is not changed compared to 03/11/2017. Lesions increased in size from 12/06/2016 no new pulmonary  nodules. Musculoskeletal: No aggressive osseous lesion Review of the MIP images confirms the above findings. CT ABDOMEN and PELVIS FINDINGS Hepatobiliary: Multiple round low-density lesions in liver parenchyma consistent with hepatic metastasis are not changed in short interval from 03/11/2017. No new lesions. Postcholecystectomy. Pancreas: Lesion head of pancreas measures smaller at 3.7 by 4.9 cm compared to 4.5 x 5.2 cm. There is atrophy duct dilatation the body and tail the pancreas. Spleen: Geographic low attenuation within the medial aspect the spleen is likely a profusion phenomena (image 34, series 4). Adrenals/urinary tract: Adrenal glands and kidneys are normal. The ureters and bladder normal. Stomach/Bowel: Stomach, small bowel, appendix, and cecum are normal. The colon and rectosigmoid colon are normal. Vascular/Lymphatic: Abdominal aorta is normal caliber. There is no retroperitoneal or periportal lymphadenopathy. No pelvic lymphadenopathy. Reproductive: Post hysterectomy. Other: No peritoneal nodularity Musculoskeletal: No aggressive osseous lesion. Review of the MIP images confirms the above findings. IMPRESSION: Chest Impression: 1. No evidence acute pulmonary embolism. 2. Suspicious RIGHT upper lobe pulmonary nodule not changed in short interval from 03/11/2017. Abdomen / Pelvis Impression: 1. Interval decrease in size of pancreatic mass in short interval follow-up. 2. Stable bilobed hepatic metastasis. 3. No peritoneal metastatic disease or progressive adenopathy. Electronically Signed   By: Suzy Bouchard M.D.   On: 04/13/2017 17:55   Ct Abdomen Pelvis W Contrast  Result Date: 04/13/2017 CLINICAL DATA:  Pancreatic carcinoma. Referral cancer center for hypotension and malaise. Chills and nausea and vomiting. Chronic RIGHT-sided abdominal pain. EXAM: CT ANGIOGRAPHY CHEST CT ABDOMEN AND PELVIS WITH CONTRAST TECHNIQUE: Multidetector CT imaging of the chest was performed using the standard  protocol during bolus administration of intravenous contrast. Multiplanar CT image reconstructions and MIPs were obtained to evaluate the vascular anatomy. Multidetector CT imaging of the abdomen and pelvis was performed using the standard protocol during bolus administration of intravenous contrast. CONTRAST:  173mL ISOVUE-370 IOPAMIDOL (ISOVUE-370) INJECTION 76% COMPARISON:  CT chest abdomen pelvis 03/11/2017 FINDINGS: CTA CHEST FINDINGS Cardiovascular: No filling defects within the pulmonary arteries arteries to suggest acute pulmonary embolism. No significant vascular findings. Normal heart size. No pericardial effusion. Mediastinum/Nodes: No axillary supraclavicular adenopathy. Port in the RIGHT chest wall with tip in distal SVC. No mediastinal hilar adenopathy. No pericardial fluid. Esophagus normal. Lungs/Pleura: 6 mm RIGHT upper lobe pulmonary nodule is not changed compared to 03/11/2017. Lesions increased in size from 12/06/2016 no new pulmonary nodules. Musculoskeletal: No aggressive osseous lesion Review of the MIP images confirms the above findings. CT ABDOMEN and PELVIS FINDINGS Hepatobiliary: Multiple round low-density lesions in liver parenchyma consistent with hepatic metastasis are not changed in short interval from 03/11/2017. No new lesions. Postcholecystectomy. Pancreas: Lesion head of pancreas measures smaller at 3.7 by 4.9 cm compared to 4.5 x 5.2 cm. There is atrophy duct dilatation the body and tail the pancreas. Spleen: Geographic low attenuation within the medial aspect the spleen is likely a profusion phenomena (image 34, series 4). Adrenals/urinary tract: Adrenal glands and kidneys are normal. The ureters and bladder normal. Stomach/Bowel: Stomach, small bowel, appendix, and cecum are normal. The colon and  rectosigmoid colon are normal. Vascular/Lymphatic: Abdominal aorta is normal caliber. There is no retroperitoneal or periportal lymphadenopathy. No pelvic lymphadenopathy. Reproductive:  Post hysterectomy. Other: No peritoneal nodularity Musculoskeletal: No aggressive osseous lesion. Review of the MIP images confirms the above findings. IMPRESSION: Chest Impression: 1. No evidence acute pulmonary embolism. 2. Suspicious RIGHT upper lobe pulmonary nodule not changed in short interval from 03/11/2017. Abdomen / Pelvis Impression: 1. Interval decrease in size of pancreatic mass in short interval follow-up. 2. Stable bilobed hepatic metastasis. 3. No peritoneal metastatic disease or progressive adenopathy. Electronically Signed   By: Suzy Bouchard M.D.   On: 04/13/2017 17:55    EKG: Independently reviewed. Sinus Tachycardia 102.  Assessment/Plan SIRS: Acute.  Patient presented diaphoretic, tachycardic, and hypotensive.  WBC elevated to 29.5 and initial lactic acid 2.6 giving concern for infection.  Just recently completed infusion for chemotherapy.  Similar symptoms like this in the past.  Question reaction with recently given chemotherapy treatment vs underlying infection.  No urinalysis obtained.  Recent history of treatment for UTI. Initially given IV fluids, but patient persistently tachycardic and CT angiogram negative for any signs of a pulmonary embolus. - Admit to a stepdown bed then changed to a telemetry bed - Follow-up blood cultures  - Sepsis protocol initiated  - Check urinalysis - Empiric antibiotics of vancomycin and Zosyn; de-escalate when medically appropriate  Hypotension: Patient's blood pressure was seen as low as 86/53, but responsive to IV fluids. - Continue IV fluids as tolerated   - trend troponins  Shortness of breath: Chest x-ray showing signs of right sided hemi-diaphragm elevation, but no significant signs of edema.. - Continuous pulse oximetry with nasal oxygen needed  Metastatic pancreatic cancer with liver involvement: Currently receiving chemotherapy followed by Dr. Benay Spice in. - Continue home regimen - Dr. Benay Spice added to patient care  team  History of DVT on chronic anticoagulation: Patient previously had a upper extremity DVT for which she was placed on Xarelto at that time when first started chemotherapy treatments.  No significant signs of a pulmonary embolism.  Patient reports being compliant with treatment. - Continue Xarelto   Anemia of chronic disease: Hemoglobin stable at 10.2. - continue to monitor  Anxiety and depression - Continue Celexa and Ativan prn anxiety   OSA on Cpap  - husband to bring patient's cpap  DVT prophylaxis: Continue Xarelto Code Status: full Family Communication: Discussed plan of care with husband and patient at bedside. Disposition Plan: TBD  Consults called: none Admission status: Inpatient  Norval Morton MD [facility] Pager [phone]   If 7PM-7AM, please contact night-coverage [email or web address] Password [facility]  04/13/2017, 9:04 PM

## 2017-04-13 NOTE — Progress Notes (Signed)
Pt presented to [facility] very ill appearing, face flushed, extremely diaphoretic, hypotensive @ 83/56, tachypneic.  Husband states pt started feeling 'bad' last night with some nausea, chills, weak, lethargic.  Notified Sandi Mealy, Pa as soon as this nurse assessed pt. Accessed port, started fluids (.9NS) . Sandi Mealy, PA arranged admission to ED. Transported to E, room # 19, via w/c with another nurse, Tylene Fantasia, RN and husband.   Report given to [facility], RN in ED.

## 2017-04-13 NOTE — ED Notes (Signed)
Bed: LE75 Expected date:  Expected time:  Means of arrival:  Comments: Hold Ca ctr pt-hypotension

## 2017-04-13 NOTE — ED Provider Notes (Signed)
I have assumed care for Dr. Ralene Bathe at signout.  Patient's past medical history reviewed.  Patient started to experience severe central chest pain and epigastric pain with feeling of shortness of breath and became profusely diaphoretic.  Dr. Ayesha Rumpf was still here and we evaluated the patient together, she identified this is a significant change from patient's initial presentation. Patient is alert but in much pain.  She is sitting at the edge of the bed with tachypnea.  She is profusely diaphoretic.  Her mental status is clear.  Blood pressure is stable but heart rate is mildly tachycardic.  She does not have any peripheral edema.  Her calves are soft and nontender.  I have reviewed other diagnostic results and at this time troponin is negative EKG does not show acute STEMI.  Due to significant change, will proceed with CT chest abdomen.  Patient will be given morphine and Protonix for chest pain.  I will continue to reassess and review diagnostic results for final disposition.  CT scan of chest and abdomen do not show pulmonary embolus or other acute finding.  On recheck patient's pain is better controlled but she does remain very diaphoretic.  She has had fluid resuscitation but continues to be tachycardic to the 110s.  At this time, plan will be for admission for ongoing hydration and pain control.   Charlesetta Shanks, MD 04/13/17 [phone]

## 2017-04-13 NOTE — Progress Notes (Signed)
[facility] Progress Note   Heather Frederick [id number] 1955/11/20 62 y.o.  Heather Frederick is managed by Dr. 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  Metastatic pancreatic cancer: Patient is status post cycle 5-day 1 of FOLFIRINOX dosed on 04/09/2017.  She is scheduled to follow-up with Dr. Ladell Pier on 04/24/2017.  Please see After Visit Summary for patient specific instructions.  Future Appointments  Date Time Provider [place]  04/24/2017  8:00 AM [facility] ONLY [facility] None  04/24/2017  8:30 AM Ladell Pier, MD [facility] None  04/24/2017  9:30 AM [facility] I26 DNS [facility] None  04/26/2017  8:00 AM [facility] F18 [facility] None  04/26/2017  1:15 PM [facility] INJ NURSE [facility] None  05/07/2017  8:00 AM [facility] LAB 4 [facility] None  05/07/2017  8:30 AM Ladell Pier, MD [facility] None  05/07/2017  9:30 AM [facility] E16 [facility] None  05/09/2017  1:30 PM [facility] FLUSH NURSE [facility] None  06/28/2017  9:15 AM Dennie Bible, NP [facility] None    No orders of the defined types were placed in this encounter.      Subjective:   Patient ID:  Heather Frederick is a 62 y.o. (DOB [birth year]) female.  Chief Complaint:  Chief Complaint  Patient presents with  . Hypotension    HPI Heather Frederick is a 62 year old female with a diagnosis of metastatic pancreatic cancer with liver metastasis.  She is status post cycle 5 of FOLFIRINOX dosed on 04/09/2017 and is treated by Dr. Ladell Pier.  The patient additionally has a history of  diabetes with episodes of hyperglycemia.  She presents to the office today after her husband had called stating that she was acutely ill since last evening and had been sweating profusely, was very weak, was cold, and dizzy.  Her temperature was 98.  Her blood sugar this morning was 145.  The patient was noted to be tachypneic, mildly tachycardic, diaphoretic, and hypotensive with a blood pressure of 86/53.  Medications: I have reviewed the patient's current medications.  Allergies:  Allergies  Allergen Reactions  . Topamax [Topiramate] Other (See Comments)    :Stroke like symptoms  . Aleve [Naproxen Sodium] Hives    Has tolerated Voltaren topical as well as aspirin.  . Bee Venom Swelling  . Echinacea Hives  . Other Other (See Comments)    Feathers cause sinus congestion  . Sulfa Antibiotics Hives  . Advil [Ibuprofen] Hives    Has tolerated Voltaren topical as well as aspirin.    Past Medical History:  Diagnosis Date  . Allergy   . Anxiety   . Arthritis   . Benign essential HTN 09/23/2014  . Cancer ([place])   . Chronic kidney disease    uti  . Depression   . Dyslipidemia   . Elevated liver enzymes   . Family history of anesthesia complication    father has a severe hard time waking up  . Gallstones    a. Seen on CT 01/2014.  Marland Kitchen GERD (gastroesophageal reflux disease)   . Hard of hearing   . Hepatic steatosis   . History of  frequent urinary tract infections   . Hyperlipidemia   . Hypertension   . Lateral epicondylitis of right elbow   . Mental disorder   . Meralgia paresthetica of right side 10/02/2012   slight at 05/2014  . Migraine headache   . Obesity   . OSA (obstructive sleep apnea)    severe with AHI 37/hr now on CPAP at 18cm H2O  . Osteoarthritis   . Pneumonia    Feb 2018  . Raynaud disease    in feet per patient   . Sepsis ([place]) 02/23/2017  . Sleep apnea    wears c-pap  . Urinary tract infection     Past Surgical History:  Procedure Laterality Date  .  ABDOMINAL HYSTERECTOMY  1/04   partial  . BLADDER SUSPENSION  6/10  . CARDIAC CATHETERIZATION    . carpel tunnel left/right  7/08, 8/08 Bilateral 8/08 and 7/08  . carpel tunnel rel Right 4/12  . CHOLECYSTECTOMY N/A 06/11/2014   Procedure: LAPAROSCOPIC CHOLECYSTECTOMY WITH ATTEMPTED INTRAOPERATIVE CHOLANGIOGRAM;  Surgeon: Jackolyn Confer, MD;  Location: [facility];  Service: General;  Laterality: N/A;  . COLONOSCOPY    . ERCP N/A 10/19/2014   Procedure: ENDOSCOPIC RETROGRADE CHOLANGIOPANCREATOGRAPHY (ERCP);  Surgeon: Ladene Artist, MD;  Location: [facility];  Service: Endoscopy;  Laterality: N/A;  . INCISIONAL HERNIA REPAIR N/A 06/30/2016   Procedure: LAPAROSCOPIC REPAIR OF INCISIONAL HERNIA WITH MESH;  Surgeon: Jackolyn Confer, MD;  Location: [facility];  Service: General;  Laterality: N/A;  . INSERTION OF MESH N/A 06/30/2016   Procedure: INSERTION OF MESH;  Surgeon: Jackolyn Confer, MD;  Location: [facility];  Service: General;  Laterality: N/A;  . IR CV LINE INJECTION  11/24/2016  . IR FLUORO GUIDE PORT INSERTION RIGHT  08/28/2016  . IR US GUIDE VASC ACCESS RIGHT  08/28/2016  . JOINT REPLACEMENT    . KNEE ARTHROSCOPY Left 12/12  . KNEE ARTHROSCOPY Right 12/06  . LEFT HEART CATHETERIZATION WITH CORONARY ANGIOGRAM N/A 03/10/2014   Procedure: LEFT HEART CATHETERIZATION WITH CORONARY ANGIOGRAM;  Surgeon: Sinclair Grooms, MD;  Location: [facility];  Service: Cardiovascular;  Laterality: N/A;  . PLANTAR FASCIA RELEASE Right 12/10  . radial tunnel release     right arm   . ROTATOR CUFF REPAIR Left 6/11  . tennis elbow release Right 7/04  . TOTAL KNEE ARTHROPLASTY Left 09/10/2012   Procedure: TOTAL KNEE ARTHROPLASTY- left;  Surgeon: Garald Balding, MD;  Location: [place];  Service: Orthopedics;  Laterality: Left;  Left total knee arthroplasty    Family History  Problem Relation Age of Onset  . Hypertension Mother   . Stroke Mother   . Liver disease Mother        Abcess  . Hypertension Father   .  Coronary artery disease Father   . Migraines Father   . Clotting disorder Father   . Kidney failure Brother   . Hypertension Brother   . Migraines Brother   . Migraines Daughter   . Breast cancer Other        Niece with breast cancer  . Colon cancer Paternal Grandmother   . Pancreatic cancer Paternal Grandmother   . Stomach cancer Paternal Grandmother   . Breast cancer Cousin   . Esophageal cancer Neg Hx   . Rectal cancer Neg Hx     Social History   Socioeconomic History  . Marital status: Married    Spouse name: Remo Lipps  . Number of children: 2  . Years  of education: 16  . Highest education level: Not on file  Social Needs  . Financial resource strain: Not on file  . Food insecurity - worry: Not on file  . Food insecurity - inability: Not on file  . Transportation needs - medical: Not on file  . Transportation needs - non-medical: Not on file  Occupational History  . Occupation: Geneticist, molecular: Psychologist, sport and exercise [employer]  Tobacco Use  . Smoking status: Never Smoker  . Smokeless tobacco: Never Used  . Tobacco comment: Secondhand - from family growing up, workplace intermittently  Substance and Sexual Activity  . Alcohol use: Yes    Alcohol/week: 0.0 oz    Comment: occasionally - intermittent, no more than twice a week  . Drug use: No  . Sexual activity: Not on file  Other Topics Concern  . Not on file  Social History Narrative   Patient is married Remo Lipps) and lives at home with her husband and child.   Patient has two children.   Patient has a college education.   Patient is right-handed.   Patient drinks 1-2 cup of coffee/tea daily.    Past Medical History, Surgical history, Social history, and Family history were reviewed and updated as appropriate.   Please see review of systems for further details on the patient's review from today.   Review of Systems:  Review of Systems  Constitutional: Positive for diaphoresis and fatigue. Negative for chills and  fever.  Respiratory: Negative for cough, choking, chest tightness and shortness of breath.   Cardiovascular: Positive for palpitations.  Gastrointestinal: Negative for abdominal pain, constipation, diarrhea, nausea and vomiting.  Neurological: Positive for dizziness.    Objective:   Physical Exam:  BP 103/70 (BP Location: Left Arm, Patient Position: Sitting)   Pulse 95   Temp (!) 96.8 F (36 C) (Oral)   Resp (!) 22   Wt 253 lb (114.8 kg)   SpO2 100%   BMI 44.82 kg/m  ECOG: 1  Physical Exam  Constitutional: She appears distressed.  HENT:  Head: Normocephalic and atraumatic.  Cardiovascular: S1 normal. Tachycardia present.  Pulmonary/Chest: Tachypnea noted. No respiratory distress. She has no wheezes. She has no rales.  Neurological: She is alert. Coordination (Patient is ambulating via use of a wheelchair) abnormal.  Skin: No rash noted. She is diaphoretic. No erythema.    Lab Review:     Component Value Date/Time   NA 133 (L) 04/13/2017 1219   NA 137 03/19/2017 1422   K 3.7 04/13/2017 1219   K 3.4 (L) 03/19/2017 1422   CL 98 04/13/2017 1219   CO2 21 (L) 04/13/2017 1219   CO2 24 03/19/2017 1422   GLUCOSE 139 04/13/2017 1219   GLUCOSE 158 (H) 03/19/2017 1422   BUN 11 04/13/2017 1219   BUN 6.2 (L) 03/19/2017 1422   CREATININE 0.86 04/09/2017 0924   CREATININE 0.8 03/19/2017 1422   CALCIUM 9.6 04/13/2017 1219   CALCIUM 8.8 03/19/2017 1422   PROT 6.9 04/13/2017 1219   PROT 6.6 03/19/2017 1422   ALBUMIN 2.8 (L) 04/13/2017 1219   ALBUMIN 2.7 (L) 03/19/2017 1422   AST 27 04/13/2017 1219   AST 24 03/19/2017 1422   ALT 29 04/13/2017 1219   ALT 20 03/19/2017 1422   ALKPHOS 220 (H) 04/13/2017 1219   ALKPHOS 150 03/19/2017 1422   BILITOT 1.4 (H) 04/13/2017 1219   BILITOT 0.42 03/19/2017 1422   GFRNONAA >60 04/13/2017 1219   GFRAA >60 04/13/2017 1219  Component Value Date/Time   WBC 29.5 (H) 04/13/2017 1219   WBC 10.6 (H) 04/09/2017 0924   RBC 3.73  04/13/2017 1219   HGB 10.0 (L) 04/09/2017 0924   HGB 9.4 (L) 03/19/2017 1422   HCT 31.7 (L) 04/13/2017 1219   HCT 30.3 (L) 03/19/2017 1422   PLT 165 04/13/2017 1219   PLT 179 03/19/2017 1422   MCV 85.0 04/13/2017 1219   MCV 86.8 03/19/2017 1422   MCH 27.3 04/13/2017 1219   MCHC 32.2 04/13/2017 1219   RDW 18.6 (H) 04/13/2017 1219   RDW 16.3 (H) 03/19/2017 1422   LYMPHSABS 2.1 04/13/2017 1219   LYMPHSABS 1.2 03/19/2017 1422   MONOABS 0.6 04/13/2017 1219   MONOABS 0.9 03/19/2017 1422   EOSABS 0.5 04/13/2017 1219   EOSABS 0.3 03/19/2017 1422   BASOSABS 0.0 04/13/2017 1219   BASOSABS 0.0 03/19/2017 1422   -------------------------------  Imaging from last 24 hours (if applicable):  Radiology interpretation: Dg Chest 2 View  Result Date: 04/13/2017 CLINICAL DATA:  Shortness of breath. EXAM: CHEST  2 VIEW COMPARISON:  Radiograph of February 23, 2017. FINDINGS: Stable cardiomediastinal silhouette. No pneumothorax is noted. Elevated right hemidiaphragm is noted with mild right basilar subsegmental atelectasis. Right internal jugular Port-A-Cath is noted with distal tip in expected position of the SVC. Left lung is clear. No significant pleural effusion is noted. Bony thorax is unremarkable. IMPRESSION: Elevated right hemidiaphragm with mild right basilar subsegmental atelectasis. No other significant abnormality is noted. Electronically Signed   By: Marijo Conception, M.D.   On: 04/13/2017 14:56

## 2017-04-13 NOTE — ED Triage Notes (Signed)
Coming from the cancer center--being treated for Pancreatic cancer. Received chemotherapy on 1/16, presented to cancer center today with nausea/vomiting, diaphoresis, chills, and hypotension. Report received from [place], [place]. Approximately 250 cc of NS administered prior to patient's arrival to ED.

## 2017-04-13 NOTE — Progress Notes (Signed)
Pharmacy Antibiotic Note  Heather Frederick is a 62 y.o. female admitted on 04/13/2017 with sepsis.  Pharmacy has been consulted for vancomycin and zosyn dosing.  Plan: Vancomycin 2500 mg load then start 1250 mg IV q24h for AUC goal 400-500.  Zosyn 3.375g IV q8h (4 hour infusion time).  Daily SCr.     Temp (24hrs), Avg:96.7 F (35.9 C), Min:96 F (35.6 C), Max:97.4 F (36.3 C)  Recent Labs  Lab 04/09/17 0924 04/13/17 1219 04/13/17 1426 04/13/17 1603  WBC 10.6* 29.5*  --   --   CREATININE 0.86  --   --   --   LATICACIDVEN  --   --  2.60* 0.89    Estimated Creatinine Clearance: 86.1 mL/min (by C-G formula based on SCr of 0.86 mg/dL).    Allergies  Allergen Reactions  . Topamax [Topiramate] Other (See Comments)    :Stroke like symptoms  . Aleve [Naproxen Sodium] Hives    Has tolerated Voltaren topical as well as aspirin.  . Bee Venom Swelling  . Echinacea Hives  . Other Other (See Comments)    Feathers cause sinus congestion  . Sulfa Antibiotics Hives  . Advil [Ibuprofen] Hives    Has tolerated Voltaren topical as well as aspirin.    Antimicrobials this admission: 1/18 Vanc >> 1/18 Zosyn >>  Dose adjustments this admission:  Microbiology results: 1/18 BCx:  1/18 UCx:    Thank you for allowing pharmacy to be a part of this patient's care.  Hershal Coria 04/13/2017 10:00 PM

## 2017-04-14 DIAGNOSIS — I82419 Acute embolism and thrombosis of unspecified femoral vein: Secondary | ICD-10-CM

## 2017-04-14 DIAGNOSIS — E871 Hypo-osmolality and hyponatremia: Secondary | ICD-10-CM

## 2017-04-14 DIAGNOSIS — I10 Essential (primary) hypertension: Secondary | ICD-10-CM

## 2017-04-14 DIAGNOSIS — D6481 Anemia due to antineoplastic chemotherapy: Secondary | ICD-10-CM

## 2017-04-14 DIAGNOSIS — T451X5A Adverse effect of antineoplastic and immunosuppressive drugs, initial encounter: Secondary | ICD-10-CM

## 2017-04-14 DIAGNOSIS — R911 Solitary pulmonary nodule: Secondary | ICD-10-CM

## 2017-04-14 LAB — BLOOD CULTURE ID PANEL (REFLEXED)
Acinetobacter baumannii: NOT DETECTED
CANDIDA ALBICANS: NOT DETECTED
CANDIDA GLABRATA: NOT DETECTED
CANDIDA PARAPSILOSIS: NOT DETECTED
CANDIDA TROPICALIS: NOT DETECTED
Candida krusei: NOT DETECTED
ENTEROBACTER CLOACAE COMPLEX: NOT DETECTED
ENTEROBACTERIACEAE SPECIES: NOT DETECTED
Enterococcus species: NOT DETECTED
Escherichia coli: NOT DETECTED
HAEMOPHILUS INFLUENZAE: NOT DETECTED
KLEBSIELLA OXYTOCA: NOT DETECTED
Klebsiella pneumoniae: NOT DETECTED
Listeria monocytogenes: NOT DETECTED
Methicillin resistance: DETECTED — AB
Neisseria meningitidis: NOT DETECTED
PROTEUS SPECIES: NOT DETECTED
Pseudomonas aeruginosa: NOT DETECTED
STAPHYLOCOCCUS SPECIES: DETECTED — AB
STREPTOCOCCUS PYOGENES: NOT DETECTED
Serratia marcescens: NOT DETECTED
Staphylococcus aureus (BCID): NOT DETECTED
Streptococcus agalactiae: NOT DETECTED
Streptococcus pneumoniae: NOT DETECTED
Streptococcus species: NOT DETECTED

## 2017-04-14 LAB — COMPREHENSIVE METABOLIC PANEL
ALBUMIN: 2.6 g/dL — AB (ref 3.5–5.0)
ALK PHOS: 166 U/L — AB (ref 38–126)
ALT: 23 U/L (ref 14–54)
AST: 22 U/L (ref 15–41)
Anion gap: 7 (ref 5–15)
BILIRUBIN TOTAL: 0.8 mg/dL (ref 0.3–1.2)
BUN: 10 mg/dL (ref 6–20)
CALCIUM: 8.3 mg/dL — AB (ref 8.9–10.3)
CO2: 23 mmol/L (ref 22–32)
Chloride: 103 mmol/L (ref 101–111)
Creatinine, Ser: 0.61 mg/dL (ref 0.44–1.00)
GFR calc Af Amer: >60 mL/min (ref >60)
GLUCOSE: 146 mg/dL — AB (ref 65–99)
POTASSIUM: 3.6 mmol/L (ref 3.5–5.1)
Sodium: 133 mmol/L — ABNORMAL LOW (ref 135–145)
TOTAL PROTEIN: 6 g/dL — AB (ref 6.5–8.1)

## 2017-04-14 LAB — RESPIRATORY PANEL BY PCR
Adenovirus: NOT DETECTED
BORDETELLA PERTUSSIS-RVPCR: NOT DETECTED
CORONAVIRUS 229E-RVPPCR: NOT DETECTED
CORONAVIRUS OC43-RVPPCR: NOT DETECTED
Chlamydophila pneumoniae: NOT DETECTED
Coronavirus HKU1: NOT DETECTED
Coronavirus NL63: NOT DETECTED
INFLUENZA B-RVPPCR: NOT DETECTED
Influenza A: NOT DETECTED
METAPNEUMOVIRUS-RVPPCR: NOT DETECTED
Mycoplasma pneumoniae: NOT DETECTED
PARAINFLUENZA VIRUS 1-RVPPCR: NOT DETECTED
Parainfluenza Virus 2: NOT DETECTED
Parainfluenza Virus 3: NOT DETECTED
Parainfluenza Virus 4: NOT DETECTED
RESPIRATORY SYNCYTIAL VIRUS-RVPPCR: NOT DETECTED
Rhinovirus / Enterovirus: NOT DETECTED

## 2017-04-14 LAB — CBC
HEMATOCRIT: 27.3 % — AB (ref 36.0–46.0)
HEMOGLOBIN: 8.7 g/dL — AB (ref 12.0–15.0)
MCH: 27.5 pg (ref 26.0–34.0)
MCHC: 31.9 g/dL (ref 30.0–36.0)
MCV: 86.4 fL (ref 78.0–100.0)
Platelets: 136 10*3/uL — ABNORMAL LOW (ref 150–400)
RBC: 3.16 MIL/uL — ABNORMAL LOW (ref 3.87–5.11)
RDW: 18.7 % — AB (ref 11.5–15.5)
WBC: 23.8 10*3/uL — AB (ref 4.0–10.5)

## 2017-04-14 LAB — URINALYSIS, ROUTINE W REFLEX MICROSCOPIC
Bacteria, UA: NONE SEEN
Bilirubin Urine: NEGATIVE
GLUCOSE, UA: NEGATIVE mg/dL
HGB URINE DIPSTICK: NEGATIVE
Ketones, ur: 20 mg/dL — AB
Nitrite: NEGATIVE
PROTEIN: NEGATIVE mg/dL
SQUAMOUS EPITHELIAL / LPF: NONE SEEN
Specific Gravity, Urine: 1.032 — ABNORMAL HIGH (ref 1.005–1.030)
pH: 8 (ref 5.0–8.0)

## 2017-04-14 LAB — GLUCOSE, CAPILLARY
GLUCOSE-CAPILLARY: 199 mg/dL — AB (ref 65–99)
Glucose-Capillary: 312 mg/dL — ABNORMAL HIGH (ref 65–99)

## 2017-04-14 LAB — TROPONIN I: Troponin I: <0.03 ng/mL (ref <0.03)

## 2017-04-14 MED ORDER — RIVAROXABAN 20 MG PO TABS
20.0000 mg | ORAL_TABLET | Freq: Every day | ORAL | Status: DC
  Administered 2017-04-14 – 2017-04-17 (×4): 20 mg via ORAL
  Filled 2017-04-14 (×4): qty 1

## 2017-04-14 MED ORDER — SODIUM CHLORIDE 0.9 % IV SOLN
Freq: Once | INTRAVENOUS | Status: AC
  Administered 2017-04-14: 20 mL/h via INTRAVENOUS
  Administered 2017-04-14: 05:00:00 via INTRAVENOUS

## 2017-04-14 MED ORDER — SODIUM CHLORIDE 0.9 % IV BOLUS (SEPSIS)
500.0000 mL | Freq: Once | INTRAVENOUS | Status: AC
  Administered 2017-04-14: 500 mL via INTRAVENOUS

## 2017-04-14 MED ORDER — SODIUM CHLORIDE 0.9 % IV BOLUS (SEPSIS): 1000.0000 mL | Freq: Once | INTRAVENOUS | Status: DC

## 2017-04-14 MED ORDER — PANTOPRAZOLE SODIUM 40 MG IV SOLR
40.0000 mg | Freq: Two times a day (BID) | INTRAVENOUS | Status: DC
  Administered 2017-04-14 – 2017-04-18 (×9): 40 mg via INTRAVENOUS
  Filled 2017-04-14 (×5): qty 40

## 2017-04-14 NOTE — Progress Notes (Signed)
Patient arriveds to unit. Pt was on 4lt oxygen saturation 100%, oxygen decreased 3 lt. Saturation  Is now 98%. Will attempt to wean further.

## 2017-04-14 NOTE — Progress Notes (Signed)
[facility] PROGRESS NOTE    Progress Note  Heather Frederick  BZJ:[id number] DOB: 01-21-1956 DOA: 04/13/2017 PCP: [name], MD     Brief Narrative:   Heather Frederick is an 62 y.o. female past medical history of metastatic pancreatic cancer to the liver on chemotherapy, DVT on anticoagulation who presents with generalized weakness and malaise, accompanied by chills and dizziness upon standing, he was hypotensive and tachycardic in the ED he was fluid resuscitated 2 L lactic acid was 2.2 chest x-ray showed no pneumonia, CT scan showed no PE UA shows no signs of infection he was started empirically on IV vancomycin and Zosyn  Assessment/Plan:   SIRS (systemic inflammatory response syndrome) (HCC)/hypotension: She has been giving 3-1/2 L of normal saline her blood pressure did improve but has slowly trended down we will go ahead and give her an additional normal saline bolus. She was started empirically on IV vancomycin and Zosyn, CT scan shows a small pulmonary nodule she is complaining of some shortness of breath and chills. She has remained afebrile but she has a white count of 30,000. This could most likely be due to infectious etiology, she been pancultured, and awaiting results. Cardiac biomarkers negative EKG unchanged from previous. Chest x-ray does not show any infiltrates, CT scan of the chest does show a 6 mm nodule in the right lung which could reflect infectious etiology  Benign essential HTN Continue to hold antihypertensive medication.  Anxiety and depression: Continue current home regimen.  Metastatic Pancreatic carcinoma metastatic to liver [facility]) Continue current pain regimen. I had a long discussion with her and her husband about goals of care. They relate they are not ready to move in that direction. They would like to keep her a full code and we will see how she does during this hospitalization.  Chronic anticoagulation/ History of Dvt femoral (deep venous  thrombosis): Continue Xarelto.  Anemia associated with chemotherapy: Hemoglobin seems to be at baseline we will continue to trend.  She denies any bleeding.  Hyponatremia Likely due to hypovolemia.  Continue hydration.  Pulmonary nodule, right Will need further follow-up as an outpatient.  DVT prophylaxis: xarelto Family Communication:husband Disposition Plan/Barrier to D/C: unable to determine Code Status:     Code Status Orders  (From admission, onward)        Start     Ordered   04/13/17 2151  Full code  Continuous     04/13/17 2153    Code Status History    Date Active Date Inactive Code Status Order ID Comments User Context   03/12/2017 23:45 03/15/2017 15:31 Full Code [id number]  Rise Patience, MD ED   02/23/2017 15:23 02/24/2017 17:58 Full Code [id number]  Annita Brod, MD ED   02/05/2017 15:35 02/06/2017 16:23 Full Code [id number]  [name], MD Inpatient   06/30/2016 11:03 07/01/2016 13:30 Full Code [id number]  Jackolyn Confer, MD Inpatient   05/06/2016 23:18 05/07/2016 21:01 Full Code [id number]  Etta Quill, DO ED   06/11/2014 18:28 06/12/2014 13:25 Full Code [id number]  Jackolyn Confer, MD Inpatient   03/10/2014 14:31 03/10/2014 21:12 Full Code [id number]  Belva Crome, MD Inpatient   09/10/2012 11:38 09/12/2012 17:57 Full Code [id number]  Garald Balding, MD Inpatient        IV Access:    Peripheral IV   Procedures and diagnostic studies:   Dg Chest 2 View  Result Date: 04/13/2017 CLINICAL DATA:  Shortness of breath. EXAM: CHEST  2 VIEW COMPARISON:  Radiograph of February 23, 2017. FINDINGS: Stable cardiomediastinal silhouette. No pneumothorax is noted. Elevated right hemidiaphragm is noted with mild right basilar subsegmental atelectasis. Right internal jugular Port-A-Cath is noted with distal tip in expected position of the SVC. Left lung is clear. No significant pleural effusion is noted. Bony thorax is unremarkable. IMPRESSION: Elevated right  hemidiaphragm with mild right basilar subsegmental atelectasis. No other significant abnormality is noted. Electronically Signed   By: Marijo Conception, M.D.   On: 04/13/2017 14:56   Ct Angio Chest Pe W/cm &/or Wo Cm  Result Date: 04/13/2017 CLINICAL DATA:  Pancreatic carcinoma. Referral cancer center for hypotension and malaise. Chills and nausea and vomiting. Chronic RIGHT-sided abdominal pain. EXAM: CT ANGIOGRAPHY CHEST CT ABDOMEN AND PELVIS WITH CONTRAST TECHNIQUE: Multidetector CT imaging of the chest was performed using the standard protocol during bolus administration of intravenous contrast. Multiplanar CT image reconstructions and MIPs were obtained to evaluate the vascular anatomy. Multidetector CT imaging of the abdomen and pelvis was performed using the standard protocol during bolus administration of intravenous contrast. CONTRAST:  134mL ISOVUE-370 IOPAMIDOL (ISOVUE-370) INJECTION 76% COMPARISON:  CT chest abdomen pelvis 03/11/2017 FINDINGS: CTA CHEST FINDINGS Cardiovascular: No filling defects within the pulmonary arteries arteries to suggest acute pulmonary embolism. No significant vascular findings. Normal heart size. No pericardial effusion. Mediastinum/Nodes: No axillary supraclavicular adenopathy. Port in the RIGHT chest wall with tip in distal SVC. No mediastinal hilar adenopathy. No pericardial fluid. Esophagus normal. Lungs/Pleura: 6 mm RIGHT upper lobe pulmonary nodule is not changed compared to 03/11/2017. Lesions increased in size from 12/06/2016 no new pulmonary nodules. Musculoskeletal: No aggressive osseous lesion Review of the MIP images confirms the above findings. CT ABDOMEN and PELVIS FINDINGS Hepatobiliary: Multiple round low-density lesions in liver parenchyma consistent with hepatic metastasis are not changed in short interval from 03/11/2017. No new lesions. Postcholecystectomy. Pancreas: Lesion head of pancreas measures smaller at 3.7 by 4.9 cm compared to 4.5 x 5.2 cm. There  is atrophy duct dilatation the body and tail the pancreas. Spleen: Geographic low attenuation within the medial aspect the spleen is likely a profusion phenomena (image 34, series 4). Adrenals/urinary tract: Adrenal glands and kidneys are normal. The ureters and bladder normal. Stomach/Bowel: Stomach, small bowel, appendix, and cecum are normal. The colon and rectosigmoid colon are normal. Vascular/Lymphatic: Abdominal aorta is normal caliber. There is no retroperitoneal or periportal lymphadenopathy. No pelvic lymphadenopathy. Reproductive: Post hysterectomy. Other: No peritoneal nodularity Musculoskeletal: No aggressive osseous lesion. Review of the MIP images confirms the above findings. IMPRESSION: Chest Impression: 1. No evidence acute pulmonary embolism. 2. Suspicious RIGHT upper lobe pulmonary nodule not changed in short interval from 03/11/2017. Abdomen / Pelvis Impression: 1. Interval decrease in size of pancreatic mass in short interval follow-up. 2. Stable bilobed hepatic metastasis. 3. No peritoneal metastatic disease or progressive adenopathy. Electronically Signed   By: Suzy Bouchard M.D.   On: 04/13/2017 17:55   Ct Abdomen Pelvis W Contrast  Result Date: 04/13/2017 CLINICAL DATA:  Pancreatic carcinoma. Referral cancer center for hypotension and malaise. Chills and nausea and vomiting. Chronic RIGHT-sided abdominal pain. EXAM: CT ANGIOGRAPHY CHEST CT ABDOMEN AND PELVIS WITH CONTRAST TECHNIQUE: Multidetector CT imaging of the chest was performed using the standard protocol during bolus administration of intravenous contrast. Multiplanar CT image reconstructions and MIPs were obtained to evaluate the vascular anatomy. Multidetector CT imaging of the abdomen and pelvis was performed using the standard protocol during bolus administration of intravenous contrast. CONTRAST:  118mL ISOVUE-370 IOPAMIDOL (ISOVUE-370) INJECTION 76%  COMPARISON:  CT chest abdomen pelvis 03/11/2017 FINDINGS: CTA CHEST  FINDINGS Cardiovascular: No filling defects within the pulmonary arteries arteries to suggest acute pulmonary embolism. No significant vascular findings. Normal heart size. No pericardial effusion. Mediastinum/Nodes: No axillary supraclavicular adenopathy. Port in the RIGHT chest wall with tip in distal SVC. No mediastinal hilar adenopathy. No pericardial fluid. Esophagus normal. Lungs/Pleura: 6 mm RIGHT upper lobe pulmonary nodule is not changed compared to 03/11/2017. Lesions increased in size from 12/06/2016 no new pulmonary nodules. Musculoskeletal: No aggressive osseous lesion Review of the MIP images confirms the above findings. CT ABDOMEN and PELVIS FINDINGS Hepatobiliary: Multiple round low-density lesions in liver parenchyma consistent with hepatic metastasis are not changed in short interval from 03/11/2017. No new lesions. Postcholecystectomy. Pancreas: Lesion head of pancreas measures smaller at 3.7 by 4.9 cm compared to 4.5 x 5.2 cm. There is atrophy duct dilatation the body and tail the pancreas. Spleen: Geographic low attenuation within the medial aspect the spleen is likely a profusion phenomena (image 34, series 4). Adrenals/urinary tract: Adrenal glands and kidneys are normal. The ureters and bladder normal. Stomach/Bowel: Stomach, small bowel, appendix, and cecum are normal. The colon and rectosigmoid colon are normal. Vascular/Lymphatic: Abdominal aorta is normal caliber. There is no retroperitoneal or periportal lymphadenopathy. No pelvic lymphadenopathy. Reproductive: Post hysterectomy. Other: No peritoneal nodularity Musculoskeletal: No aggressive osseous lesion. Review of the MIP images confirms the above findings. IMPRESSION: Chest Impression: 1. No evidence acute pulmonary embolism. 2. Suspicious RIGHT upper lobe pulmonary nodule not changed in short interval from 03/11/2017. Abdomen / Pelvis Impression: 1. Interval decrease in size of pancreatic mass in short interval follow-up. 2. Stable  bilobed hepatic metastasis. 3. No peritoneal metastatic disease or progressive adenopathy. Electronically Signed   By: Suzy Bouchard M.D.   On: 04/13/2017 17:55     Medical Consultants:    None.  Anti-Infectives:   She was started empirically on IV vancomycin and Zosyn  Subjective:    Heather Frederick she relates she still feels significantly tired, she is dizzy upon standing, and is complaining of new thoracic back pain. She relates she is nauseated but has not vomited.  Objective:    Vitals:   04/14/17 0630 04/14/17 0733 04/14/17 0830 04/14/17 1000  BP: (!) 115/50 (!) 110/59 119/69 (!) 98/51  Pulse: 91 88 91 99  Resp: 17 20 (!) 21 20  Temp:  (!) 97.5 F (36.4 C)    TempSrc:  Oral    SpO2: 96% 98% 96% 99%    Intake/Output Summary (Last 24 hours) at 04/14/2017 1026 Last data filed at 04/14/2017 0438 Gross per 24 hour  Intake 3150 ml  Output -  Net 3150 ml   There were no vitals filed for this visit.  Exam: General exam: In no acute distress, morbidly obese Respiratory system: Has good air movement bilaterally and clear to auscultation. Cardiovascular system: S1 & S2 heard, RRR.  Cannot appreciate any JVD. Gastrointestinal system: Abdomen is nondistended, soft and nontender.  Central nervous system: Alert and oriented. No focal neurological deficits. Extremities: No pedal edema. Skin: Skin is intact. Psychiatry: Judgement and insight appear normal. Mood & affect appropriate.    Data Reviewed:    Labs: Basic Metabolic Panel: Recent Labs  Lab 04/09/17 0924 04/13/17 1219 04/14/17 0439  NA 137 133* 133*  K 4.1 3.7 3.6  CL 103 98 103  CO2 24 21* 23  GLUCOSE 234* 139 146*  BUN 8 11 10   CREATININE 0.86  --  0.61  CALCIUM 9.6 9.6 8.3*  MG  --  1.6  --    GFR Estimated Creatinine Clearance: 92.5 mL/min (by C-G formula based on SCr of 0.61 mg/dL). Liver Function Tests: Recent Labs  Lab 04/09/17 0924 04/13/17 1219 04/14/17 0439  AST 26 27 22   ALT  18 29 23   ALKPHOS 169* 220* 166*  BILITOT 0.7 1.4* 0.8  PROT 6.9 6.9 6.0*  ALBUMIN 2.8* 2.8* 2.6*   Recent Labs  Lab 04/13/17 1419  LIPASE 17   No results for input(s): AMMONIA in the last 168 hours. Coagulation profile No results for input(s): INR, PROTIME in the last 168 hours.  CBC: Recent Labs  Lab 04/09/17 0924 04/13/17 1219 04/14/17 0439  WBC 10.6* 29.5* 23.8*  NEUTROABS 7.8* 26.4*  --   HGB 10.0*  --  8.7*  HCT 32.5* 31.7* 27.3*  MCV 87.8 85.0 86.4  PLT 191 165 136*   Cardiac Enzymes: Recent Labs  Lab 04/14/17 0439  TROPONINI <0.03   BNP (last 3 results) No results for input(s): PROBNP in the last 8760 hours. CBG: Recent Labs  Lab 04/13/17 1519 04/13/17 1557  GLUCAP 138* 131*   D-Dimer: No results for input(s): DDIMER in the last 72 hours. Hgb A1c: No results for input(s): HGBA1C in the last 72 hours. Lipid Profile: No results for input(s): CHOL, HDL, LDLCALC, TRIG, CHOLHDL, LDLDIRECT in the last 72 hours. Thyroid function studies: No results for input(s): TSH, T4TOTAL, T3FREE, THYROIDAB in the last 72 hours.  Invalid input(s): FREET3 Anemia work up: No results for input(s): VITAMINB12, FOLATE, FERRITIN, TIBC, IRON, RETICCTPCT in the last 72 hours. Sepsis Labs: Recent Labs  Lab 04/09/17 0924 04/13/17 1219 04/13/17 1426 04/13/17 1603 04/14/17 0439  WBC 10.6* 29.5*  --   --  23.8*  LATICACIDVEN  --   --  2.60* 0.89  --    Microbiology No results found for this or any previous visit (from the past 240 hour(s)).   Medications:   . aspirin EC  81 mg Oral Daily  . citalopram  20 mg Oral Daily  . morphine  30 mg Oral Q12H  . oxybutynin  10 mg Oral q morning - 10a  . pantoprazole  40 mg Oral BID  . pregabalin  200 mg Oral Daily  . rivaroxaban  20 mg Oral Daily   Continuous Infusions: . piperacillin-tazobactam (ZOSYN)  IV 3.375 g (04/14/17 0832)  . vancomycin       LOS: 1 day   Charlynne Cousins  [facility] Pager  [phone]  *Please refer to [email or web address], password [email or web address] to get updated schedule on who will round on this patient, as hospitalists switch teams weekly. If 7PM-7AM, please contact night-coverage at [email or web address], password [email or web address] for any overnight needs.  04/14/2017, 10:26 AM

## 2017-04-14 NOTE — ED Notes (Signed)
Pt has ambulated to restroom with 2 person assistance at this time.

## 2017-04-15 LAB — BASIC METABOLIC PANEL
Anion gap: 7 (ref 5–15)
BUN: 6 mg/dL (ref 6–20)
CHLORIDE: 103 mmol/L (ref 101–111)
CO2: 24 mmol/L (ref 22–32)
CREATININE: 0.6 mg/dL (ref 0.44–1.00)
Calcium: 7.8 mg/dL — ABNORMAL LOW (ref 8.9–10.3)
Glucose, Bld: 210 mg/dL — ABNORMAL HIGH (ref 65–99)
POTASSIUM: 3.3 mmol/L — AB (ref 3.5–5.1)
Sodium: 134 mmol/L — ABNORMAL LOW (ref 135–145)

## 2017-04-15 LAB — CBC
HCT: 24.4 % — ABNORMAL LOW (ref 36.0–46.0)
HEMOGLOBIN: 7.7 g/dL — AB (ref 12.0–15.0)
MCH: 27.3 pg (ref 26.0–34.0)
MCHC: 31.6 g/dL (ref 30.0–36.0)
MCV: 86.5 fL (ref 78.0–100.0)
PLATELETS: 115 10*3/uL — AB (ref 150–400)
RBC: 2.82 MIL/uL — AB (ref 3.87–5.11)
RDW: 18.8 % — ABNORMAL HIGH (ref 11.5–15.5)
WBC: 14.8 10*3/uL — ABNORMAL HIGH (ref 4.0–10.5)

## 2017-04-15 LAB — URINE CULTURE

## 2017-04-15 LAB — CREATININE, SERUM
Creatinine, Ser: 0.61 mg/dL (ref 0.44–1.00)
GFR calc Af Amer: >60 mL/min (ref >60)
GFR calc non Af Amer: >60 mL/min (ref >60)

## 2017-04-15 MED ORDER — SODIUM CHLORIDE 0.9% FLUSH
10.0000 mL | INTRAVENOUS | Status: DC | PRN
  Administered 2017-04-16 – 2017-04-18 (×2): 10 mL
  Filled 2017-04-15 (×2): qty 40

## 2017-04-15 NOTE — Progress Notes (Signed)
PHARMACY - PHYSICIAN COMMUNICATION CRITICAL VALUE ALERT - BLOOD CULTURE IDENTIFICATION (BCID)  Heather Frederick is an 62 y.o. female who presented to [facility] on 04/13/2017 with a chief complaint of weakness  Assessment: patient with SIRS on Vancomycin and Zosyn (empiric)  (include suspected source if known)  Name of physician (or Provider) Contacted: Raliegh Ip Schorr  Current antibiotics: Vancomycin and Zosyn   Changes to prescribed antibiotics recommended: Stay on Vancomycin and Zosyn for now, allow for cultures to finalize.  Recommendations accepted by provider  Results for orders placed or performed during the hospital encounter of 04/13/17  Blood Culture ID Panel (Reflexed) (Collected: 04/13/2017  9:43 PM)  Result Value Ref Range   Enterococcus species NOT DETECTED NOT DETECTED   Listeria monocytogenes NOT DETECTED NOT DETECTED   Staphylococcus species DETECTED (A) NOT DETECTED   Staphylococcus aureus NOT DETECTED NOT DETECTED   Methicillin resistance DETECTED (A) NOT DETECTED   Streptococcus species NOT DETECTED NOT DETECTED   Streptococcus agalactiae NOT DETECTED NOT DETECTED   Streptococcus pneumoniae NOT DETECTED NOT DETECTED   Streptococcus pyogenes NOT DETECTED NOT DETECTED   Acinetobacter baumannii NOT DETECTED NOT DETECTED   Enterobacteriaceae species NOT DETECTED NOT DETECTED   Enterobacter cloacae complex NOT DETECTED NOT DETECTED   Escherichia coli NOT DETECTED NOT DETECTED   Klebsiella oxytoca NOT DETECTED NOT DETECTED   Klebsiella pneumoniae NOT DETECTED NOT DETECTED   Proteus species NOT DETECTED NOT DETECTED   Serratia marcescens NOT DETECTED NOT DETECTED   Haemophilus influenzae NOT DETECTED NOT DETECTED   Neisseria meningitidis NOT DETECTED NOT DETECTED   Pseudomonas aeruginosa NOT DETECTED NOT DETECTED   Candida albicans NOT DETECTED NOT DETECTED   Candida glabrata NOT DETECTED NOT DETECTED   Candida krusei NOT DETECTED NOT DETECTED   Candida parapsilosis NOT  DETECTED NOT DETECTED   Candida tropicalis NOT DETECTED NOT DETECTED    [name] 04/15/2017  5:53 AM

## 2017-04-15 NOTE — Progress Notes (Signed)
[facility] PROGRESS NOTE    Progress Note  Heather Frederick  JOA:[id number] DOB: 1956/01/01 DOA: 04/13/2017 PCP: [name], MD     Brief Narrative:   Heather Frederick is an 62 y.o. female past medical history of metastatic pancreatic cancer to the liver on chemotherapy, DVT on anticoagulation who presents with generalized weakness and malaise, accompanied by chills and dizziness upon standing, he was hypotensive and tachycardic in the ED he was fluid resuscitated 2 L lactic acid was 2.2 chest x-ray showed no pneumonia, CT scan showed no PE UA shows no signs of infection he was started empirically on IV vancomycin and Zosyn  Assessment/Plan:   SIRS (systemic inflammatory response syndrome) (HCC)/hypotension: Blood Pressure has stabilized, heart rate has improved significantly to less than 100. Question if this is due to to chemotherapy. Continue empirical IV vancomycin and Zosyn, CT scan shows a small pulmonary nodule she is complaining of some shortness of breath and chills. Her white blood cell count was almost 30,000, CBC is pending today but yesterday in the evening it was 23 with that seems to point towards some improvement. Has been pancultured culture data is pending. CT scan of the chest does show a 6 mm nodule in the right lung which could reflect infectious etiology  Benign essential HTN Continue to hold antihypertensive medication.  Anxiety and depression: Continue current home regimen.  Metastatic Pancreatic carcinoma metastatic to liver [facility]) Continue current pain regimen. To be working for her pain.  Chronic anticoagulation/ History of Dvt femoral (deep venous thrombosis): Continue Xarelto.  Anemia associated with chemotherapy: Hemoglobin seems to be at baseline we will continue to trend.  She denies any bleeding.  Hyponatremia Likely due to hypovolemia.  Basic metabolic panel is pending this morning.  Pulmonary nodule, right Will need further follow-up as  an outpatient.  DVT prophylaxis: xarelto Family Communication:husband Disposition Plan/Barrier to D/C: Hopefully in 2-3 days Code Status:     Code Status Orders  (From admission, onward)        Start     Ordered   04/13/17 2151  Full code  Continuous     04/13/17 2153    Code Status History    Date Active Date Inactive Code Status Order ID Comments User Context   03/12/2017 23:45 03/15/2017 15:31 Full Code [id number]  Rise Patience, MD ED   02/23/2017 15:23 02/24/2017 17:58 Full Code [id number]  Annita Brod, MD ED   02/05/2017 15:35 02/06/2017 16:23 Full Code [id number]  [name], MD Inpatient   06/30/2016 11:03 07/01/2016 13:30 Full Code [id number]  Jackolyn Confer, MD Inpatient   05/06/2016 23:18 05/07/2016 21:01 Full Code [id number]  Etta Quill, DO ED   06/11/2014 18:28 06/12/2014 13:25 Full Code [id number]  Jackolyn Confer, MD Inpatient   03/10/2014 14:31 03/10/2014 21:12 Full Code [id number]  Belva Crome, MD Inpatient   09/10/2012 11:38 09/12/2012 17:57 Full Code [id number]  Garald Balding, MD Inpatient        IV Access:    Peripheral IV   Procedures and diagnostic studies:   Dg Chest 2 View  Result Date: 04/13/2017 CLINICAL DATA:  Shortness of breath. EXAM: CHEST  2 VIEW COMPARISON:  Radiograph of February 23, 2017. FINDINGS: Stable cardiomediastinal silhouette. No pneumothorax is noted. Elevated right hemidiaphragm is noted with mild right basilar subsegmental atelectasis. Right internal jugular Port-A-Cath is noted with distal tip in expected position of the SVC. Left lung is clear. No significant pleural effusion is noted. Bony thorax  is unremarkable. IMPRESSION: Elevated right hemidiaphragm with mild right basilar subsegmental atelectasis. No other significant abnormality is noted. Electronically Signed   By: Marijo Conception, M.D.   On: 04/13/2017 14:56   Ct Angio Chest Pe W/cm &/or Wo Cm  Result Date: 04/13/2017 CLINICAL DATA:  Pancreatic carcinoma.  Referral cancer center for hypotension and malaise. Chills and nausea and vomiting. Chronic RIGHT-sided abdominal pain. EXAM: CT ANGIOGRAPHY CHEST CT ABDOMEN AND PELVIS WITH CONTRAST TECHNIQUE: Multidetector CT imaging of the chest was performed using the standard protocol during bolus administration of intravenous contrast. Multiplanar CT image reconstructions and MIPs were obtained to evaluate the vascular anatomy. Multidetector CT imaging of the abdomen and pelvis was performed using the standard protocol during bolus administration of intravenous contrast. CONTRAST:  127mL ISOVUE-370 IOPAMIDOL (ISOVUE-370) INJECTION 76% COMPARISON:  CT chest abdomen pelvis 03/11/2017 FINDINGS: CTA CHEST FINDINGS Cardiovascular: No filling defects within the pulmonary arteries arteries to suggest acute pulmonary embolism. No significant vascular findings. Normal heart size. No pericardial effusion. Mediastinum/Nodes: No axillary supraclavicular adenopathy. Port in the RIGHT chest wall with tip in distal SVC. No mediastinal hilar adenopathy. No pericardial fluid. Esophagus normal. Lungs/Pleura: 6 mm RIGHT upper lobe pulmonary nodule is not changed compared to 03/11/2017. Lesions increased in size from 12/06/2016 no new pulmonary nodules. Musculoskeletal: No aggressive osseous lesion Review of the MIP images confirms the above findings. CT ABDOMEN and PELVIS FINDINGS Hepatobiliary: Multiple round low-density lesions in liver parenchyma consistent with hepatic metastasis are not changed in short interval from 03/11/2017. No new lesions. Postcholecystectomy. Pancreas: Lesion head of pancreas measures smaller at 3.7 by 4.9 cm compared to 4.5 x 5.2 cm. There is atrophy duct dilatation the body and tail the pancreas. Spleen: Geographic low attenuation within the medial aspect the spleen is likely a profusion phenomena (image 34, series 4). Adrenals/urinary tract: Adrenal glands and kidneys are normal. The ureters and bladder normal.  Stomach/Bowel: Stomach, small bowel, appendix, and cecum are normal. The colon and rectosigmoid colon are normal. Vascular/Lymphatic: Abdominal aorta is normal caliber. There is no retroperitoneal or periportal lymphadenopathy. No pelvic lymphadenopathy. Reproductive: Post hysterectomy. Other: No peritoneal nodularity Musculoskeletal: No aggressive osseous lesion. Review of the MIP images confirms the above findings. IMPRESSION: Chest Impression: 1. No evidence acute pulmonary embolism. 2. Suspicious RIGHT upper lobe pulmonary nodule not changed in short interval from 03/11/2017. Abdomen / Pelvis Impression: 1. Interval decrease in size of pancreatic mass in short interval follow-up. 2. Stable bilobed hepatic metastasis. 3. No peritoneal metastatic disease or progressive adenopathy. Electronically Signed   By: Suzy Bouchard M.D.   On: 04/13/2017 17:55   Ct Abdomen Pelvis W Contrast  Result Date: 04/13/2017 CLINICAL DATA:  Pancreatic carcinoma. Referral cancer center for hypotension and malaise. Chills and nausea and vomiting. Chronic RIGHT-sided abdominal pain. EXAM: CT ANGIOGRAPHY CHEST CT ABDOMEN AND PELVIS WITH CONTRAST TECHNIQUE: Multidetector CT imaging of the chest was performed using the standard protocol during bolus administration of intravenous contrast. Multiplanar CT image reconstructions and MIPs were obtained to evaluate the vascular anatomy. Multidetector CT imaging of the abdomen and pelvis was performed using the standard protocol during bolus administration of intravenous contrast. CONTRAST:  170mL ISOVUE-370 IOPAMIDOL (ISOVUE-370) INJECTION 76% COMPARISON:  CT chest abdomen pelvis 03/11/2017 FINDINGS: CTA CHEST FINDINGS Cardiovascular: No filling defects within the pulmonary arteries arteries to suggest acute pulmonary embolism. No significant vascular findings. Normal heart size. No pericardial effusion. Mediastinum/Nodes: No axillary supraclavicular adenopathy. Port in the RIGHT chest  wall with tip in distal  SVC. No mediastinal hilar adenopathy. No pericardial fluid. Esophagus normal. Lungs/Pleura: 6 mm RIGHT upper lobe pulmonary nodule is not changed compared to 03/11/2017. Lesions increased in size from 12/06/2016 no new pulmonary nodules. Musculoskeletal: No aggressive osseous lesion Review of the MIP images confirms the above findings. CT ABDOMEN and PELVIS FINDINGS Hepatobiliary: Multiple round low-density lesions in liver parenchyma consistent with hepatic metastasis are not changed in short interval from 03/11/2017. No new lesions. Postcholecystectomy. Pancreas: Lesion head of pancreas measures smaller at 3.7 by 4.9 cm compared to 4.5 x 5.2 cm. There is atrophy duct dilatation the body and tail the pancreas. Spleen: Geographic low attenuation within the medial aspect the spleen is likely a profusion phenomena (image 34, series 4). Adrenals/urinary tract: Adrenal glands and kidneys are normal. The ureters and bladder normal. Stomach/Bowel: Stomach, small bowel, appendix, and cecum are normal. The colon and rectosigmoid colon are normal. Vascular/Lymphatic: Abdominal aorta is normal caliber. There is no retroperitoneal or periportal lymphadenopathy. No pelvic lymphadenopathy. Reproductive: Post hysterectomy. Other: No peritoneal nodularity Musculoskeletal: No aggressive osseous lesion. Review of the MIP images confirms the above findings. IMPRESSION: Chest Impression: 1. No evidence acute pulmonary embolism. 2. Suspicious RIGHT upper lobe pulmonary nodule not changed in short interval from 03/11/2017. Abdomen / Pelvis Impression: 1. Interval decrease in size of pancreatic mass in short interval follow-up. 2. Stable bilobed hepatic metastasis. 3. No peritoneal metastatic disease or progressive adenopathy. Electronically Signed   By: Suzy Bouchard M.D.   On: 04/13/2017 17:55     Medical Consultants:    None.  Anti-Infectives:   She was started empirically on IV vancomycin and  Zosyn  Subjective:    Heather Frederick she relates she still feels significantly tired, she is dizzy upon standing, and is complaining of new thoracic back pain. She relates she is nauseated but has not vomited.  Objective:    Vitals:   04/14/17 2134 04/14/17 2207 04/14/17 2221 04/15/17 0500  BP: 110/67  106/64 (!) 120/55  Pulse: 95 92 90 92  Resp: 16 17 18 18   Temp: 97.8 F (36.6 C)   98.5 F (36.9 C)  TempSrc: Oral   Oral  SpO2: 100% 100% 94% 99%  Weight:      Height:        Intake/Output Summary (Last 24 hours) at 04/15/2017 0955 Last data filed at 04/15/2017 0123 Gross per 24 hour  Intake 1300 ml  Output 500 ml  Net 800 ml   Filed Weights   04/14/17 1452  Weight: 115.6 kg (254 lb 13.6 oz)    Exam: General exam: In no acute distress, morbidly obese Respiratory system: Has good air movement bilaterally and clear to auscultation. Cardiovascular system: S1 & S2 heard, RRR.  Cannot appreciate any JVD. Gastrointestinal system: Abdomen is nondistended, soft and nontender.  Central nervous system: Alert and oriented. No focal neurological deficits. Extremities: No pedal edema. Skin: Skin is intact. Psychiatry: Judgement and insight appear normal. Mood & affect appropriate.    Data Reviewed:    Labs: Basic Metabolic Panel: Recent Labs  Lab 04/09/17 0924 04/13/17 1219 04/14/17 0439 04/15/17 0359  NA 137 133* 133*  --   K 4.1 3.7 3.6  --   CL 103 98 103  --   CO2 24 21* 23  --   GLUCOSE 234* 139 146*  --   BUN 8 11 10   --   CREATININE 0.86  --  0.61 0.61  CALCIUM 9.6 9.6 8.3*  --   MG  --  1.6  --   --    GFR Estimated Creatinine Clearance: 92.9 mL/min (by C-G formula based on SCr of 0.61 mg/dL). Liver Function Tests: Recent Labs  Lab 04/09/17 0924 04/13/17 1219 04/14/17 0439  AST 26 27 22   ALT 18 29 23   ALKPHOS 169* 220* 166*  BILITOT 0.7 1.4* 0.8  PROT 6.9 6.9 6.0*  ALBUMIN 2.8* 2.8* 2.6*   Recent Labs  Lab 04/13/17 1419  LIPASE 17   No  results for input(s): AMMONIA in the last 168 hours. Coagulation profile No results for input(s): INR, PROTIME in the last 168 hours.  CBC: Recent Labs  Lab 04/09/17 0924 04/13/17 1219 04/14/17 0439  WBC 10.6* 29.5* 23.8*  NEUTROABS 7.8* 26.4*  --   HGB 10.0*  --  8.7*  HCT 32.5* 31.7* 27.3*  MCV 87.8 85.0 86.4  PLT 191 165 136*   Cardiac Enzymes: Recent Labs  Lab 04/14/17 0439  TROPONINI <0.03   BNP (last 3 results) No results for input(s): PROBNP in the last 8760 hours. CBG: Recent Labs  Lab 04/13/17 1519 04/13/17 1557 04/14/17 1510 04/14/17 2139  GLUCAP 138* 131* 312* 199*   D-Dimer: No results for input(s): DDIMER in the last 72 hours. Hgb A1c: No results for input(s): HGBA1C in the last 72 hours. Lipid Profile: No results for input(s): CHOL, HDL, LDLCALC, TRIG, CHOLHDL, LDLDIRECT in the last 72 hours. Thyroid function studies: No results for input(s): TSH, T4TOTAL, T3FREE, THYROIDAB in the last 72 hours.  Invalid input(s): FREET3 Anemia work up: No results for input(s): VITAMINB12, FOLATE, FERRITIN, TIBC, IRON, RETICCTPCT in the last 72 hours. Sepsis Labs: Recent Labs  Lab 04/09/17 0924 04/13/17 1219 04/13/17 1426 04/13/17 1603 04/14/17 0439  WBC 10.6* 29.5*  --   --  23.8*  LATICACIDVEN  --   --  2.60* 0.89  --    Microbiology Recent Results (from the past 240 hour(s))  Urine culture     Status: Abnormal   Collection Time: 04/13/17  2:00 PM  Result Value Ref Range Status   Specimen Description URINE, RANDOM  Final   Special Requests NONE  Final   Culture MULTIPLE SPECIES PRESENT, SUGGEST RECOLLECTION (A)  Final   Report Status 04/15/2017 FINAL  Final  Blood culture (routine x 2)     Status: None (Preliminary result)   Collection Time: 04/13/17  9:43 PM  Result Value Ref Range Status   Specimen Description BLOOD PORTA CATH  Final   Special Requests   Final    BOTTLES DRAWN AEROBIC AND ANAEROBIC Blood Culture adequate volume   Culture  Setup  Time   Final    GRAM POSITIVE COCCI IN BOTH AEROBIC AND ANAEROBIC BOTTLES to JGrismsley(PharmD) by [facility] 04/14/17 AT 10:55PM Performed at [facility], [place] [address] [place], [place] [address]    Culture GRAM POSITIVE COCCI  Final   Report Status PENDING  Incomplete  Blood Culture ID Panel (Reflexed)     Status: Abnormal   Collection Time: 04/13/17  9:43 PM  Result Value Ref Range Status   Enterococcus species NOT DETECTED NOT DETECTED Final   Listeria monocytogenes NOT DETECTED NOT DETECTED Final   Staphylococcus species DETECTED (A) NOT DETECTED Final    Comment: Methicillin (oxacillin) resistant coagulase negative staphylococcus. Possible blood culture contaminant (unless isolated from more than one blood culture draw or clinical case suggests pathogenicity). No antibiotic treatment is indicated for blood  culture contaminants. CRITICAL RESULT CALLED TO, READ BACK BY AND VERIFIED WITH:  TO JGRISMELY(PharmD) by [email or web address] 04/14/17 at 10:55am    Staphylococcus aureus NOT DETECTED NOT DETECTED Final   Methicillin resistance DETECTED (A) NOT DETECTED Final    Comment: CRITICAL RESULT CALLED TO, READ BACK BY AND VERIFIED WITH: TO JGRISMELY(PharmD) by [email or web address] 04/14/17 at 10:55am    Streptococcus species NOT DETECTED NOT DETECTED Final   Streptococcus agalactiae NOT DETECTED NOT DETECTED Final   Streptococcus pneumoniae NOT DETECTED NOT DETECTED Final   Streptococcus pyogenes NOT DETECTED NOT DETECTED Final   Acinetobacter baumannii NOT DETECTED NOT DETECTED Final   Enterobacteriaceae species NOT DETECTED NOT DETECTED Final   Enterobacter cloacae complex NOT DETECTED NOT DETECTED Final   Escherichia coli NOT DETECTED NOT DETECTED Final   Klebsiella oxytoca NOT DETECTED NOT DETECTED Final   Klebsiella pneumoniae NOT DETECTED NOT DETECTED Final   Proteus species NOT DETECTED NOT DETECTED Final   Serratia marcescens NOT DETECTED NOT DETECTED Final   Haemophilus influenzae NOT  DETECTED NOT DETECTED Final   Neisseria meningitidis NOT DETECTED NOT DETECTED Final   Pseudomonas aeruginosa NOT DETECTED NOT DETECTED Final   Candida albicans NOT DETECTED NOT DETECTED Final   Candida glabrata NOT DETECTED NOT DETECTED Final   Candida krusei NOT DETECTED NOT DETECTED Final   Candida parapsilosis NOT DETECTED NOT DETECTED Final   Candida tropicalis NOT DETECTED NOT DETECTED Final    Comment: Performed at [facility], [place]. [address] [place], [place] [address]  Respiratory Panel by PCR     Status: None   Collection Time: 04/14/17  2:30 PM  Result Value Ref Range Status   Adenovirus NOT DETECTED NOT DETECTED Final   Coronavirus 229E NOT DETECTED NOT DETECTED Final   Coronavirus HKU1 NOT DETECTED NOT DETECTED Final   Coronavirus NL63 NOT DETECTED NOT DETECTED Final   Coronavirus OC43 NOT DETECTED NOT DETECTED Final   Metapneumovirus NOT DETECTED NOT DETECTED Final   Rhinovirus / Enterovirus NOT DETECTED NOT DETECTED Final   Influenza A NOT DETECTED NOT DETECTED Final   Influenza B NOT DETECTED NOT DETECTED Final   Parainfluenza Virus 1 NOT DETECTED NOT DETECTED Final   Parainfluenza Virus 2 NOT DETECTED NOT DETECTED Final   Parainfluenza Virus 3 NOT DETECTED NOT DETECTED Final   Parainfluenza Virus 4 NOT DETECTED NOT DETECTED Final   Respiratory Syncytial Virus NOT DETECTED NOT DETECTED Final   Bordetella pertussis NOT DETECTED NOT DETECTED Final   Chlamydophila pneumoniae NOT DETECTED NOT DETECTED Final   Mycoplasma pneumoniae NOT DETECTED NOT DETECTED Final    Comment: Performed at [facility], [place] [address], [place], [place] [address]     Medications:   . aspirin EC  81 mg Oral Daily  . citalopram  20 mg Oral Daily  . morphine  30 mg Oral Q12H  . oxybutynin  10 mg Oral q morning - 10a  . pantoprazole (PROTONIX) IV  40 mg Intravenous Q12H  . pregabalin  200 mg Oral Daily  . rivaroxaban  20 mg Oral QHS   Continuous Infusions: .  piperacillin-tazobactam (ZOSYN)  IV 3.375 g (04/15/17 0540)  . vancomycin Stopped (04/14/17 2343)     LOS: 2 days   [facility] Pager [phone]  *Please refer to [email or web address], password [email or web address] to get updated schedule on who will round on this patient, as hospitalists switch teams weekly. If 7PM-7AM, please contact night-coverage at [email or web address], password [email or web address] for any overnight needs.  04/15/2017, 9:55 AM

## 2017-04-15 NOTE — Progress Notes (Signed)
Report received by ongoing nurse. Agreed with RN assessment of patient and will cont to monitor.  

## 2017-04-16 DIAGNOSIS — C787 Secondary malignant neoplasm of liver and intrahepatic bile duct: Secondary | ICD-10-CM

## 2017-04-16 DIAGNOSIS — C251 Malignant neoplasm of body of pancreas: Secondary | ICD-10-CM

## 2017-04-16 LAB — BASIC METABOLIC PANEL
Anion gap: 7 (ref 5–15)
BUN: 5 mg/dL — AB (ref 6–20)
CALCIUM: 8.3 mg/dL — AB (ref 8.9–10.3)
CO2: 25 mmol/L (ref 22–32)
CREATININE: 0.59 mg/dL (ref 0.44–1.00)
Chloride: 103 mmol/L (ref 101–111)
GFR calc non Af Amer: >60 mL/min (ref >60)
Glucose, Bld: 144 mg/dL — ABNORMAL HIGH (ref 65–99)
Potassium: 3.2 mmol/L — ABNORMAL LOW (ref 3.5–5.1)
SODIUM: 135 mmol/L (ref 135–145)

## 2017-04-16 LAB — CULTURE, BLOOD (ROUTINE X 2): SPECIAL REQUESTS: ADEQUATE

## 2017-04-16 MED ORDER — ONDANSETRON HCL 4 MG/2ML IJ SOLN: 4.0000 mg | Freq: Four times a day (QID) | INTRAMUSCULAR | Status: DC | PRN

## 2017-04-16 MED ORDER — DIPHENHYDRAMINE HCL 12.5 MG/5ML PO ELIX: 12.5000 mg | ORAL_SOLUTION | Freq: Four times a day (QID) | ORAL | Status: DC | PRN

## 2017-04-16 MED ORDER — SODIUM CHLORIDE 0.9% FLUSH: 9.0000 mL | INTRAVENOUS | Status: DC | PRN

## 2017-04-16 MED ORDER — LORAZEPAM 0.5 MG PO TABS
0.5000 mg | ORAL_TABLET | Freq: Three times a day (TID) | ORAL | Status: DC | PRN
  Administered 2017-04-18: 0.5 mg via ORAL
  Filled 2017-04-16: qty 1

## 2017-04-16 MED ORDER — HYDROMORPHONE HCL 2 MG PO TABS
4.0000 mg | ORAL_TABLET | ORAL | Status: DC | PRN
  Administered 2017-04-16 – 2017-04-18 (×6): 4 mg via ORAL
  Filled 2017-04-16 (×6): qty 2

## 2017-04-16 MED ORDER — DIPHENHYDRAMINE HCL 50 MG/ML IJ SOLN: 12.5000 mg | Freq: Four times a day (QID) | INTRAMUSCULAR | Status: DC | PRN

## 2017-04-16 MED ORDER — CRANBERRY-VITAMIN C-VITAMIN E 140-100-3 MG-MG-UNIT PO CAPS: ORAL_CAPSULE | Freq: Every day | ORAL | Status: DC

## 2017-04-16 MED ORDER — HYDROMORPHONE HCL 1 MG/ML IJ SOLN
2.0000 mg | INTRAMUSCULAR | Status: DC | PRN
  Administered 2017-04-17 – 2017-04-18 (×4): 2 mg via INTRAVENOUS
  Filled 2017-04-16 (×4): qty 2

## 2017-04-16 MED ORDER — POTASSIUM CHLORIDE CRYS ER 20 MEQ PO TBCR
40.0000 meq | EXTENDED_RELEASE_TABLET | Freq: Two times a day (BID) | ORAL | Status: AC
Start: 2017-04-16 — End: 2017-04-16
  Administered 2017-04-16 (×2): 40 meq via ORAL
  Filled 2017-04-16 (×2): qty 2

## 2017-04-16 MED ORDER — HYDROMORPHONE HCL 2 MG PO TABS: 8.0000 mg | ORAL_TABLET | ORAL | Status: DC | PRN

## 2017-04-16 MED ORDER — POLYETHYLENE GLYCOL 3350 17 G PO PACK: 17.0000 g | PACK | Freq: Every day | ORAL | Status: DC | PRN

## 2017-04-16 MED ORDER — NALOXONE HCL 0.4 MG/ML IJ SOLN: 0.4000 mg | INTRAMUSCULAR | Status: DC | PRN

## 2017-04-16 MED ORDER — HYDROMORPHONE 1 MG/ML IV SOLN
INTRAVENOUS | Status: DC
  Administered 2017-04-16: 12:00:00 via INTRAVENOUS
  Filled 2017-04-16: qty 25

## 2017-04-16 MED ORDER — HYDROMORPHONE HCL 2 MG PO TABS: 4.0000 mg | ORAL_TABLET | ORAL | Status: DC | PRN

## 2017-04-16 MED ORDER — POLYETHYLENE GLYCOL 3350 17 G PO PACK
17.0000 g | PACK | Freq: Every day | ORAL | Status: AC
  Administered 2017-04-16: 17 g via ORAL
  Filled 2017-04-16 (×2): qty 1

## 2017-04-16 NOTE — Progress Notes (Signed)
PHARMACIST - PHYSICIAN ORDER COMMUNICATION  CONCERNING: P&T Medication Policy on Herbal Medications  DESCRIPTION:  This patient's order for:  Cranberry  has been noted.  This product(s) is classified as an "herbal" or natural product. Due to a lack of definitive safety studies or FDA approval, nonstandard manufacturing practices, plus the potential risk of unknown drug-drug interactions while on inpatient medications, the Pharmacy and Therapeutics Committee does not permit the use of "herbal" or natural products of this type within [facility].   ACTION TAKEN: The pharmacy department is unable to verify this order at this time and your patient has been informed of this safety policy. Please reevaluate patient's clinical condition at discharge and address if the herbal or natural product(s) should be resumed at that time.  Netta Cedars, PharmD, BCPS 04/16/2017@10 :43 AM

## 2017-04-16 NOTE — Progress Notes (Addendum)
PCA started as MD ordered, educated pt on PCA administration. Pt husband at bedside. Acknowledged understanding. Later after about 30 minutes usage, pt crying and very upset husband asked writer to speak with pt, pt does not want the PCA pain pump; she would prefer other pain management options. MD updated and notified. Orders noted. Will cont to monitor patient and pain control. Husband remains at bedside. SRP, RN

## 2017-04-16 NOTE — Progress Notes (Signed)
IP PROGRESS NOTE  Subjective:   Heather Frederick was admitted 04/13/2017 after presenting to the Cancer center with chest discomfort and diaphoresis.  She was noted to be tachycardic.  She was admitted for further evaluation.  A CT of the chest showed no acute finding.  The lactic acid was elevated.  She was placed on empiric antibiotics.  A urine culture from 04/13/2017 grew multiple species.  A blood culture from the Port-A-Cath is growing gram-positive cocci identified as a coagulase-negative Staphylococcus species.  She reports feeling better.  She continues to have pain at the right lateral abdomen/chest.  The pain is relieved with Dilaudid.  No nausea or diarrhea. Objective: Vital signs in last 24 hours: Blood pressure 110/78, pulse 78, temperature 97.7 F (36.5 C), temperature source Oral, resp. rate 18, height _0  (1.6 m), weight 254 lb 13.6 oz (115.6 kg), SpO2 100 %.  Intake/Output from previous day: 01/20 0701 - 01/21 0700 In: 1110 [P.O.:660; IV Piggyback:450] Out: -   Physical Exam:  HEENT: No thrush or ulcers Lungs: Clear bilaterally, no respiratory distress Cardiac: Regular rate and rhythm Abdomen: Soft, no mass, nontender Extremities: No leg edema   Portacath/PICC-without erythema  Lab Results: Recent Labs    04/14/17 0439 04/15/17 1445  WBC 23.8* 14.8*  HGB 8.7* 7.7*  HCT 27.3* 24.4*  PLT 136* 115*    BMET Recent Labs    04/15/17 1445 04/16/17 0333  NA 134* 135  K 3.3* 3.2*  CL 103 103  CO2 24 25  GLUCOSE 210* 144*  BUN 6 5*  CREATININE 0.60 0.59  CALCIUM 7.8* 8.3*    No results found for: CEA1  Studies/Results: No results found.  Medications: I have reviewed the patient's current medications.  Assessment/Plan: 1. Metastatic pancreas cancer ? Pancreas body mass and liver metastases noted on CT abdomen/pelvis 08/11/2016 ? Ultrasound-guided biopsy of a right liver lesion 08/16/2016 revealed poorly differentiated adenocarcinoma consistent with  pancreas cancer ? Foundation 1-microsatellite stable; tumor mutational burden 1; ERBB2 amplification ? Cycle 1 gemcitabine/Abraxane 09/13/2016; 09/29/2016 ? Cycle 2 gemcitabine/Abraxane 10/11/2016, 10/18/2016 ? Cycle 3 gemcitabine/Abraxane 11/01/2016, 11/08/2016 ? Cycle 4 gemcitabine/Abraxane 11/23/2016,11/29/2016 ? CT chest 12/06/2016-liver lesions appear smaller ? Cycle 5 gemcitabine/Abraxane 12/13/2016 ? CT abdomen/pelvis 01/01/2017-new and enlarging hepatic masses. Enlarging pancreatic mass. ? Cycle 6 gemcitabine/Abraxane 01/03/2017 ? Rising CA 19-9, increased pain October 2018 ? Cycle 1 FOLFOX 01/31/2017 ? Cycle 2 FOLFOX 02/19/2017 ? Cycle 3FOLFIRINOX 03/06/2017 ? CT 03/11/2017 (compared to 01/01/2017) increased liver lesions and enlargement of the pancreas mass ? Cycle4FOLFIRINOX 03/21/2017 (oxaliplatin dose reduced and Neulasta added) ? Cycle 5 FOLFIRINOX 04/09/2017  2. Pain secondary to pancreas cancer, MS Contin added 03/13/2017  3. Hypertension  4. Sleep apnea  5. Chronic low back pain  6. Recurrent urinary tract infections  7. Depression  8. Migraine headaches  9. Port-A-Cath placement 08/28/2016  10. Rash following cycle 1 gemcitabine/Abraxane-drug rash?  11. Nausea/vomiting following cycle 1 gemcitabine/Abraxane-antiemetic regimen adjusted with addition of Aloxi  12. Diabetes  13. Right cephalic vein thrombosis [id number] with Xarelto  14. CT chest 12/06/2016 done to evaluate dyspnea-several 3-4 mm nodular opacities in the lung parenchyma etiology uncertain. Known mass in the body of the pancreas measuring 3.3 x 2.4 cm;small enhancing lesion in the anterior dome of the liver measures 8 mm.  15. Right forearm rash following cycle 5 day 8 gemcitabine/Abraxane. Resolved  16.Klebsiella urinary tract infection 03/14/2017  17.  Admission 04/13/2017 with diaphoresis and tachycardia-resolved, 1 blood culture positive for  a coagulase-negative  Staphylococcus  Heather Frederick was admitted on day 5 following cycle 5 FOLFIRINOX.  The etiology of her symptoms is unclear.  It is possible her symptoms were related to chemotherapy or an infection.  A single blood culture returned positive for a coagulase-negative staphylococcal's-likely a contaminant.  It is possible the Port-A-Cath is infected.  Recommendations: 1.  Repeat blood culture from Port-A-Cath 2.  Continue MS Contin and Dilaudid for pain 3.  Outpatient follow-up as scheduled at the Cancer center 04/24/2017    LOS: 3 days   Betsy Coder, MD   04/16/2017, 3:49 PM

## 2017-04-16 NOTE — Progress Notes (Signed)
Pharmacy Antibiotic Note  Heather [facility] is a 62 y.o. female with metastatic pancreatic cancer currently undergoing chemotherapy treatment, presented to the ED on 04/13/2017 with hypotension. Broad abx with vancomycin and zosyn were started on admission for suspected sepsis.  Today, 04/16/2017: - day #3 abx -  afebrile. WBC down 14.8. SCr 0.59 (CrCl 93) - 1/2 bcx with CoNS -- final results pending  Plan: - continue zosyn 3.375 gm IV q8h (infuse over 4 hrs) - vancomycin 1250 mg IV q24h - if to continue with vancomycin, pharmacy will plan on checking level in the next couple of days.  _______________________________________  Height: 5\' 3"  (160 cm) Weight: 254 lb 13.6 oz (115.6 kg) IBW/kg (Calculated) : 52.4  Temp (24hrs), Avg:97.9 F (36.6 C), Min:97.8 F (36.6 C), Max:98.2 F (36.8 C)  Recent Labs  Lab 04/13/17 1219 04/13/17 1426 04/13/17 1603 04/14/17 0439 04/15/17 0359 04/15/17 1445 04/16/17 0333  WBC 29.5*  --   --  23.8*  --  14.8*  --   CREATININE  --   --   --  0.61 0.61 0.60 0.59  LATICACIDVEN  --  2.60* 0.89  --   --   --   --     Estimated Creatinine Clearance: 92.9 mL/min (by C-G formula based on SCr of 0.59 mg/dL).    Allergies  Allergen Reactions  . Topamax [Topiramate] Other (See Comments)    Stroke like symptoms  . Aleve [Naproxen Sodium] Hives    Has tolerated Voltaren topical as well as aspirin.  . Bee Venom Swelling  . Echinacea Hives  . Other Other (See Comments)    Feathers cause sinus congestion  . Sulfa Antibiotics Hives  . Advil [Ibuprofen] Hives    Has tolerated Voltaren topical as well as aspirin.    Antimicrobials this admission: 1/18 Vanc >> 1/18 Zosyn >>   Microbiology results: 1/18 BCx x2: 1/2 GPC (BCID = CoNS--MecA+) 1/18 UCx: 1/18 resp panel: negative  Thank you for allowing pharmacy to be a part of this patient's care.  Lynelle Doctor 04/16/2017 10:19 AM

## 2017-04-16 NOTE — Progress Notes (Addendum)
[facility] PROGRESS NOTE    Progress Note  Heather [facility]  WUJ:[id number] DOB: March 18, 1956 DOA: 04/13/2017 PCP: Heather Gravel, MD     Brief Narrative:   Heather [facility] is an 62 y.o. female past medical history of metastatic pancreatic cancer to the liver on chemotherapy, DVT on anticoagulation who presents with generalized weakness and malaise, accompanied by chills and dizziness upon standing, he was hypotensive and tachycardic in the ED he was fluid resuscitated 2 L lactic acid was 2.2 chest x-ray showed no pneumonia, CT scan showed no PE UA shows no signs of infection he was started empirically on IV vancomycin and Zosyn  Assessment/Plan:   SIRS (systemic inflammatory response syndrome) (HCC)/hypotension: Vital signs are now stable, her lactic acidosis is clear Differential includes bacteremia versus chemotherapy reaction. Continue empirical IV vancomycin and Zosyn. White blood cell count significantly improved, Blood culture: 1-2 staph coagulase species probably a contaminant.,  Blood culture ID reflex panel show possible MRSA, will continue IV Vanco, virus panel is negative. CT scan of the chest does show a 6 mm nodule in the right lung which could reflect infectious etiology  Benign essential HTN Continue to hold antihypertensive medication.  Anxiety and depression: Continue current home regimen.  Metastatic Pancreatic carcinoma metastatic to liver [facility]) Continue current pain regimen. Patient is complaining of back pain that started since he has been here in the [facility], started on a PCA pump due to current home narcotic regimen.  Chronic anticoagulation/ History of Dvt femoral (deep venous thrombosis): Continue Xarelto.  Anemia associated with chemotherapy: Hemoglobin seems to be at baseline we will continue to trend.  She denies any bleeding.  Hypovolemic hyponatremia Likely due to hypovolemia.    Pulmonary nodule, right Will need further follow-up as an  outpatient.  Hypokalemia: Replete potassium orally.  DVT prophylaxis: xarelto Family Communication:husband Disposition Plan/Barrier to D/C: Hopefully in 2 days Code Status:     Code Status Orders  (From admission, onward)        Start     Ordered   04/13/17 2151  Full code  Continuous     04/13/17 2153    Code Status History    Date Active Date Inactive Code Status Order ID Comments User Context   03/12/2017 23:45 03/15/2017 15:31 Full Code [id number]  Heather Patience, MD ED   02/23/2017 15:23 02/24/2017 17:58 Full Code [id number]  Heather Brod, MD ED   02/05/2017 15:35 02/06/2017 16:23 Full Code [id number]  Heather Gravel, MD Inpatient   06/30/2016 11:03 07/01/2016 13:30 Full Code [id number]  Heather Confer, MD Inpatient   05/06/2016 23:18 05/07/2016 21:01 Full Code [id number]  Heather Quill, DO ED   06/11/2014 18:28 06/12/2014 13:25 Full Code [id number]  Heather Confer, MD Inpatient   03/10/2014 14:31 03/10/2014 21:12 Full Code [id number]  Heather Crome, MD Inpatient   09/10/2012 11:38 09/12/2012 17:57 Full Code [id number]  Heather Balding, MD Inpatient        IV Access:    Peripheral IV   Procedures and diagnostic studies:   No results found.   Medical Consultants:    None.  Anti-Infectives:   She was started empirically on IV vancomycin and Zosyn  Subjective:    Heather [facility] she relates she still feels significantly tired, she is dizzy upon standing, and is complaining of new thoracic back pain. She relates she is nauseated but has not vomited.  Objective:    Vitals:   04/15/17 1844 04/15/17 2019 04/15/17 2152 04/16/17  0558  BP: (!) 122/42 121/62  (!) 117/58  Pulse:  97 89 88  Resp:  20 18 16   Temp:  97.8 F (36.6 C)  97.8 F (36.6 C)  TempSrc:  Oral  Oral  SpO2:  100% 100% 99%  Weight:      Height:        Intake/Output Summary (Last 24 hours) at 04/16/2017 1015 Last data filed at 04/16/2017 0600 Gross per 24 hour  Intake 1110 ml    Output -  Net 1110 ml   Filed Weights   04/14/17 1452  Weight: 115.6 kg (254 lb 13.6 oz)    Exam: General exam: In no acute distress, morbidly obese Respiratory system: Has good air movement bilaterally and clear to auscultation. Cardiovascular system: S1 & S2 heard, RRR.  Cannot appreciate any JVD. Gastrointestinal system: Abdomen is nondistended, soft and nontender.  Central nervous system: Alert and oriented. No focal neurological deficits. Extremities: No pedal edema. Skin: Skin is intact. Psychiatry: Judgement and insight appear normal. Mood & affect appropriate.    Data Reviewed:    Labs: Basic Metabolic Panel: Recent Labs  Lab 04/13/17 1219 04/14/17 0439 04/15/17 0359 04/15/17 1445 04/16/17 0333  NA 133* 133*  --  134* 135  K 3.7 3.6  --  3.3* 3.2*  CL 98 103  --  103 103  CO2 21* 23  --  24 25  GLUCOSE 139 146*  --  210* 144*  BUN 11 10  --  6 5*  CREATININE  --  0.61 0.61 0.60 0.59  CALCIUM 9.6 8.3*  --  7.8* 8.3*  MG 1.6  --   --   --   --    GFR Estimated Creatinine Clearance: 92.9 mL/min (by C-G formula based on SCr of 0.59 mg/dL). Liver Function Tests: Recent Labs  Lab 04/13/17 1219 04/14/17 0439  AST 27 22  ALT 29 23  ALKPHOS 220* 166*  BILITOT 1.4* 0.8  PROT 6.9 6.0*  ALBUMIN 2.8* 2.6*   Recent Labs  Lab 04/13/17 1419  LIPASE 17   No results for input(s): AMMONIA in the last 168 hours. Coagulation profile No results for input(s): INR, PROTIME in the last 168 hours.  CBC: Recent Labs  Lab 04/13/17 1219 04/14/17 0439 04/15/17 1445  WBC 29.5* 23.8* 14.8*  NEUTROABS 26.4*  --   --   HGB  --  8.7* 7.7*  HCT 31.7* 27.3* 24.4*  MCV 85.0 86.4 86.5  PLT 165 136* 115*   Cardiac Enzymes: Recent Labs  Lab 04/14/17 0439  TROPONINI <0.03   BNP (last 3 results) No results for input(s): PROBNP in the last 8760 hours. CBG: Recent Labs  Lab 04/13/17 1519 04/13/17 1557 04/14/17 1510 04/14/17 2139  GLUCAP 138* 131* 312* 199*    D-Dimer: No results for input(s): DDIMER in the last 72 hours. Hgb A1c: No results for input(s): HGBA1C in the last 72 hours. Lipid Profile: No results for input(s): CHOL, HDL, LDLCALC, TRIG, CHOLHDL, LDLDIRECT in the last 72 hours. Thyroid function studies: No results for input(s): TSH, T4TOTAL, T3FREE, THYROIDAB in the last 72 hours.  Invalid input(s): FREET3 Anemia work up: No results for input(s): VITAMINB12, FOLATE, FERRITIN, TIBC, IRON, RETICCTPCT in the last 72 hours. Sepsis Labs: Recent Labs  Lab 04/13/17 1219 04/13/17 1426 04/13/17 1603 04/14/17 0439 04/15/17 1445  WBC 29.5*  --   --  23.8* 14.8*  LATICACIDVEN  --  2.60* 0.89  --   --    Microbiology  Recent Results (from the past 240 hour(s))  Urine culture     Status: Abnormal   Collection Time: 04/13/17  2:00 PM  Result Value Ref Range Status   Specimen Description URINE, RANDOM  Final   Special Requests NONE  Final   Culture MULTIPLE SPECIES PRESENT, SUGGEST RECOLLECTION (A)  Final   Report Status 04/15/2017 FINAL  Final  Blood culture (routine x 2)     Status: None (Preliminary result)   Collection Time: 04/13/17  9:40 PM  Result Value Ref Range Status   Specimen Description BLOOD LEFT ANTECUBITAL  Final   Special Requests   Final    BOTTLES DRAWN AEROBIC AND ANAEROBIC Blood Culture adequate volume   Culture   Final    NO GROWTH 1 DAY Performed at [facility], [address], [place], [place] [address]    Report Status PENDING  Incomplete  Blood culture (routine x 2)     Status: Abnormal   Collection Time: 04/13/17  9:43 PM  Result Value Ref Range Status   Specimen Description BLOOD PORTA CATH  Final   Special Requests   Final    BOTTLES DRAWN AEROBIC AND ANAEROBIC Blood Culture adequate volume   Culture  Setup Time   Final    GRAM POSITIVE COCCI IN BOTH AEROBIC AND ANAEROBIC BOTTLES to JGrismsley(PharmD) by [place] [facility] 04/14/17 AT 10:55PM    Culture (A)  Final    STAPHYLOCOCCUS SPECIES  (COAGULASE NEGATIVE) THE SIGNIFICANCE OF ISOLATING THIS ORGANISM FROM A SINGLE SET OF BLOOD CULTURES WHEN MULTIPLE SETS ARE DRAWN IS UNCERTAIN. PLEASE NOTIFY THE MICROBIOLOGY DEPARTMENT WITHIN ONE WEEK IF SPECIATION AND SENSITIVITIES ARE REQUIRED. Performed at [facility], [place] [address] [place], [place] [address]    Report Status 04/16/2017 FINAL  Final  Blood Culture ID Panel (Reflexed)     Status: Abnormal   Collection Time: 04/13/17  9:43 PM  Result Value Ref Range Status   Enterococcus species NOT DETECTED NOT DETECTED Final   Listeria monocytogenes NOT DETECTED NOT DETECTED Final   Staphylococcus species DETECTED (A) NOT DETECTED Final    Comment: Methicillin (oxacillin) resistant coagulase negative staphylococcus. Possible blood culture contaminant (unless isolated from more than one blood culture draw or clinical case suggests pathogenicity). No antibiotic treatment is indicated for blood  culture contaminants. CRITICAL RESULT CALLED TO, READ BACK BY AND VERIFIED WITH: TO JGRISMELY(PharmD) by [email or web address] 04/14/17 at 10:55am    Staphylococcus aureus NOT DETECTED NOT DETECTED Final   Methicillin resistance DETECTED (A) NOT DETECTED Final    Comment: CRITICAL RESULT CALLED TO, READ BACK BY AND VERIFIED WITH: TO JGRISMELY(PharmD) by [email or web address] 04/14/17 at 10:55am    Streptococcus species NOT DETECTED NOT DETECTED Final   Streptococcus agalactiae NOT DETECTED NOT DETECTED Final   Streptococcus pneumoniae NOT DETECTED NOT DETECTED Final   Streptococcus pyogenes NOT DETECTED NOT DETECTED Final   Acinetobacter baumannii NOT DETECTED NOT DETECTED Final   Enterobacteriaceae species NOT DETECTED NOT DETECTED Final   Enterobacter cloacae complex NOT DETECTED NOT DETECTED Final   Escherichia coli NOT DETECTED NOT DETECTED Final   Klebsiella oxytoca NOT DETECTED NOT DETECTED Final   Klebsiella pneumoniae NOT DETECTED NOT DETECTED Final   Proteus species NOT DETECTED NOT DETECTED Final     Serratia marcescens NOT DETECTED NOT DETECTED Final   Haemophilus influenzae NOT DETECTED NOT DETECTED Final   Neisseria meningitidis NOT DETECTED NOT DETECTED Final   Pseudomonas aeruginosa NOT DETECTED NOT DETECTED Final   Candida albicans NOT DETECTED  NOT DETECTED Final   Candida glabrata NOT DETECTED NOT DETECTED Final   Candida krusei NOT DETECTED NOT DETECTED Final   Candida parapsilosis NOT DETECTED NOT DETECTED Final   Candida tropicalis NOT DETECTED NOT DETECTED Final    Comment: Performed at [facility], [place] [address], [place], [place] [address]  Respiratory Panel by PCR     Status: None   Collection Time: 04/14/17  2:30 PM  Result Value Ref Range Status   Adenovirus NOT DETECTED NOT DETECTED Final   Coronavirus 229E NOT DETECTED NOT DETECTED Final   Coronavirus HKU1 NOT DETECTED NOT DETECTED Final   Coronavirus NL63 NOT DETECTED NOT DETECTED Final   Coronavirus OC43 NOT DETECTED NOT DETECTED Final   Metapneumovirus NOT DETECTED NOT DETECTED Final   Rhinovirus / Enterovirus NOT DETECTED NOT DETECTED Final   Influenza A NOT DETECTED NOT DETECTED Final   Influenza B NOT DETECTED NOT DETECTED Final   Parainfluenza Virus 1 NOT DETECTED NOT DETECTED Final   Parainfluenza Virus 2 NOT DETECTED NOT DETECTED Final   Parainfluenza Virus 3 NOT DETECTED NOT DETECTED Final   Parainfluenza Virus 4 NOT DETECTED NOT DETECTED Final   Respiratory Syncytial Virus NOT DETECTED NOT DETECTED Final   Bordetella pertussis NOT DETECTED NOT DETECTED Final   Chlamydophila pneumoniae NOT DETECTED NOT DETECTED Final   Mycoplasma pneumoniae NOT DETECTED NOT DETECTED Final    Comment: Performed at [facility], [place] [address] [place], [place] [address]     Medications:   . aspirin EC  81 mg Oral Daily  . citalopram  20 mg Oral Daily  . morphine  30 mg Oral Q12H  . oxybutynin  10 mg Oral q morning - 10a  . pantoprazole (PROTONIX) IV  40 mg Intravenous Q12H  . pregabalin  200 mg  Oral Daily  . rivaroxaban  20 mg Oral QHS   Continuous Infusions: . piperacillin-tazobactam (ZOSYN)  IV Stopped (04/16/17 0916)  . vancomycin Stopped (04/15/17 2342)     LOS: 3 days   [facility] Pager [phone]  *Please refer to [email or web address], password [email or web address] to get updated schedule on who will round on this patient, as hospitalists switch teams weekly. If 7PM-7AM, please contact night-coverage at [email or web address], password [email or web address] for any overnight needs.  04/16/2017, 10:15 AM

## 2017-04-17 LAB — BASIC METABOLIC PANEL
Anion gap: 4 — ABNORMAL LOW (ref 5–15)
CALCIUM: 8.6 mg/dL — AB (ref 8.9–10.3)
CHLORIDE: 102 mmol/L (ref 101–111)
CO2: 28 mmol/L (ref 22–32)
CREATININE: 0.54 mg/dL (ref 0.44–1.00)
GFR calc Af Amer: >60 mL/min (ref >60)
GFR calc non Af Amer: >60 mL/min (ref >60)
GLUCOSE: 130 mg/dL — AB (ref 65–99)
Potassium: 4 mmol/L (ref 3.5–5.1)
Sodium: 134 mmol/L — ABNORMAL LOW (ref 135–145)

## 2017-04-17 LAB — CBC
HCT: 25.7 % — ABNORMAL LOW (ref 36.0–46.0)
Hemoglobin: 8.1 g/dL — ABNORMAL LOW (ref 12.0–15.0)
MCH: 27.2 pg (ref 26.0–34.0)
MCHC: 31.5 g/dL (ref 30.0–36.0)
MCV: 86.2 fL (ref 78.0–100.0)
PLATELETS: 108 10*3/uL — AB (ref 150–400)
RBC: 2.98 MIL/uL — ABNORMAL LOW (ref 3.87–5.11)
RDW: 19.2 % — ABNORMAL HIGH (ref 11.5–15.5)
WBC: 12.7 10*3/uL — ABNORMAL HIGH (ref 4.0–10.5)

## 2017-04-17 NOTE — Progress Notes (Signed)
[facility] PROGRESS NOTE    Progress Note  Heather Frederick  MWU:[id number] DOB: Nov 05, 1955 DOA: 04/13/2017 PCP: [name], MD     Brief Narrative:   Heather Frederick is an 62 y.o. female past medical history of metastatic pancreatic cancer to the liver on chemotherapy, DVT on anticoagulation who presents with generalized weakness and malaise, accompanied by chills and dizziness upon standing, he was hypotensive and tachycardic in the ED he was fluid resuscitated 2 L lactic acid was 2.2 chest x-ray showed no pneumonia, CT scan showed no PE UA shows no signs of infection he was started empirically on IV vancomycin and Zosyn  Assessment/Plan:   SIRS (systemic inflammatory response syndrome) (HCC)/hypotension: Vital signs are now stable, her lactic acidosis is clear Differential includes bacteremia versus chemotherapy reaction. Continue empirical IV vancomycin and Zosyn. Blood culture: 1-2 staph coagulase species probably a contaminant.,  Blood culture ID reflex panel show possible MRSA, will await final blood cultures results., will continue IV Vanco, virus panel is negative. We have ordered on 04/17/2017 blood cultures from [facility]. CT scan of the chest does show a 6 mm nodule in the right lung which could reflect infectious etiology  Benign essential HTN Continue to hold antihypertensive medication.  Anxiety and depression: Continue current home regimen.  Metastatic Pancreatic carcinoma metastatic to liver [facility]) Continue current pain regimen. Pain seems to be controlled with IV Dilaudid pushes.  Chronic anticoagulation/ History of Dvt femoral (deep venous thrombosis): Continue Xarelto.  Anemia associated with chemotherapy: Hemoglobin seems to be at baseline we will continue to trend.  She denies any bleeding.  Hypovolemic hyponatremia Likely due to hypovolemia, resolved.  Pulmonary nodule, right Will need further follow-up as an outpatient.  Hypokalemia: Repleted  orally resolved.  DVT prophylaxis: xarelto Family Communication:husband Disposition Plan/Barrier to D/C: Hopefully in 2 days Code Status:     Code Status Orders  (From admission, onward)        Start     Ordered   04/13/17 2151  Full code  Continuous     04/13/17 2153    Code Status History    Date Active Date Inactive Code Status Order ID Comments User Context   03/12/2017 23:45 03/15/2017 15:31 Full Code [id number]  Rise Patience, MD ED   02/23/2017 15:23 02/24/2017 17:58 Full Code [id number]  Annita Brod, MD ED   02/05/2017 15:35 02/06/2017 16:23 Full Code [id number]  [name], MD Inpatient   06/30/2016 11:03 07/01/2016 13:30 Full Code [id number]  Jackolyn Confer, MD Inpatient   05/06/2016 23:18 05/07/2016 21:01 Full Code [id number]  Etta Quill, DO ED   06/11/2014 18:28 06/12/2014 13:25 Full Code [id number]  Jackolyn Confer, MD Inpatient   03/10/2014 14:31 03/10/2014 21:12 Full Code [id number]  Belva Crome, MD Inpatient   09/10/2012 11:38 09/12/2012 17:57 Full Code [id number]  Garald Balding, MD Inpatient        IV Access:    Peripheral IV   Procedures and diagnostic studies:   No results found.   Medical Consultants:    None.  Anti-Infectives:   She was started empirically on IV vancomycin and Zosyn  Subjective:    Heather Frederick relates she feels better today.  Objective:    Vitals:   04/16/17 0558 04/16/17 1300 04/16/17 2200 04/17/17 0510  BP: (!) 117/58 110/78 (!) 127/59 (!) 114/51  Pulse: 88 78 97 95  Resp: 16 18 20 18   Temp: 97.8 F (36.6 C) 97.7 F (36.5 C) 99.9 F (  37.7 C) 99.1 F (37.3 C)  TempSrc: Oral Oral Oral Oral  SpO2: 99% 100% 100% 94%  Weight:      Height:        Intake/Output Summary (Last 24 hours) at 04/17/2017 1010 Last data filed at 04/17/2017 0600 Gross per 24 hour  Intake 910 ml  Output -  Net 910 ml   Filed Weights   04/14/17 1452  Weight: 115.6 kg (254 lb 13.6 oz)    Exam: General exam: In  no acute distress, morbidly obese Respiratory system: Has good air movement bilaterally and clear to auscultation. Cardiovascular system: S1 & S2 heard, RRR.  Cannot appreciate any JVD. Gastrointestinal system: Abdomen is nondistended, soft and nontender.  Central nervous system: Alert and oriented. No focal neurological deficits. Extremities: No pedal edema. Skin: Skin is intact. Psychiatry: Judgement and insight appear normal. Mood & affect appropriate.    Data Reviewed:    Labs: Basic Metabolic Panel: Recent Labs  Lab 04/13/17 1219 04/14/17 0439 04/15/17 0359 04/15/17 1445 04/16/17 0333 04/17/17 0420  NA 133* 133*  --  134* 135 134*  K 3.7 3.6  --  3.3* 3.2* 4.0  CL 98 103  --  103 103 102  CO2 21* 23  --  24 25 28   GLUCOSE 139 146*  --  210* 144* 130*  BUN 11 10  --  6 5* <5*  CREATININE  --  0.61 0.61 0.60 0.59 0.54  CALCIUM 9.6 8.3*  --  7.8* 8.3* 8.6*  MG 1.6  --   --   --   --   --    GFR Estimated Creatinine Clearance: 92.9 mL/min (by C-G formula based on SCr of 0.54 mg/dL). Liver Function Tests: Recent Labs  Lab 04/13/17 1219 04/14/17 0439  AST 27 22  ALT 29 23  ALKPHOS 220* 166*  BILITOT 1.4* 0.8  PROT 6.9 6.0*  ALBUMIN 2.8* 2.6*   Recent Labs  Lab 04/13/17 1419  LIPASE 17   No results for input(s): AMMONIA in the last 168 hours. Coagulation profile No results for input(s): INR, PROTIME in the last 168 hours.  CBC: Recent Labs  Lab 04/13/17 1219 04/14/17 0439 04/15/17 1445 04/17/17 0420  WBC 29.5* 23.8* 14.8* 12.7*  NEUTROABS 26.4*  --   --   --   HGB  --  8.7* 7.7* 8.1*  HCT 31.7* 27.3* 24.4* 25.7*  MCV 85.0 86.4 86.5 86.2  PLT 165 136* 115* 108*   Cardiac Enzymes: Recent Labs  Lab 04/14/17 0439  TROPONINI <0.03   BNP (last 3 results) No results for input(s): PROBNP in the last 8760 hours. CBG: Recent Labs  Lab 04/13/17 1519 04/13/17 1557 04/14/17 1510 04/14/17 2139  GLUCAP 138* 131* 312* 199*   D-Dimer: No results  for input(s): DDIMER in the last 72 hours. Hgb A1c: No results for input(s): HGBA1C in the last 72 hours. Lipid Profile: No results for input(s): CHOL, HDL, LDLCALC, TRIG, CHOLHDL, LDLDIRECT in the last 72 hours. Thyroid function studies: No results for input(s): TSH, T4TOTAL, T3FREE, THYROIDAB in the last 72 hours.  Invalid input(s): FREET3 Anemia work up: No results for input(s): VITAMINB12, FOLATE, FERRITIN, TIBC, IRON, RETICCTPCT in the last 72 hours. Sepsis Labs: Recent Labs  Lab 04/13/17 1219 04/13/17 1426 04/13/17 1603 04/14/17 0439 04/15/17 1445 04/17/17 0420  WBC 29.5*  --   --  23.8* 14.8* 12.7*  LATICACIDVEN  --  2.60* 0.89  --   --   --  Microbiology Recent Results (from the past 240 hour(s))  Urine culture     Status: Abnormal   Collection Time: 04/13/17  2:00 PM  Result Value Ref Range Status   Specimen Description URINE, RANDOM  Final   Special Requests NONE  Final   Culture MULTIPLE SPECIES PRESENT, SUGGEST RECOLLECTION (A)  Final   Report Status 04/15/2017 FINAL  Final  Blood culture (routine x 2)     Status: None (Preliminary result)   Collection Time: 04/13/17  9:40 PM  Result Value Ref Range Status   Specimen Description BLOOD LEFT ANTECUBITAL  Final   Special Requests   Final    BOTTLES DRAWN AEROBIC AND ANAEROBIC Blood Culture adequate volume   Culture   Final    NO GROWTH 2 DAYS Performed at [facility], [address] [place], [place] [address]    Report Status PENDING  Incomplete  Blood culture (routine x 2)     Status: Abnormal   Collection Time: 04/13/17  9:43 PM  Result Value Ref Range Status   Specimen Description BLOOD PORTA CATH  Final   Special Requests   Final    BOTTLES DRAWN AEROBIC AND ANAEROBIC Blood Culture adequate volume   Culture  Setup Time   Final    GRAM POSITIVE COCCI IN BOTH AEROBIC AND ANAEROBIC BOTTLES to JGrismsley(PharmD) by [facility] 04/14/17 AT 10:55PM    Culture (A)  Final    STAPHYLOCOCCUS SPECIES  (COAGULASE NEGATIVE) THE SIGNIFICANCE OF ISOLATING THIS ORGANISM FROM A SINGLE SET OF BLOOD CULTURES WHEN MULTIPLE SETS ARE DRAWN IS UNCERTAIN. PLEASE NOTIFY THE MICROBIOLOGY DEPARTMENT WITHIN ONE WEEK IF SPECIATION AND SENSITIVITIES ARE REQUIRED. Performed at [facility], [place] [address], [place], [place] [address]    Report Status 04/16/2017 FINAL  Final  Blood Culture ID Panel (Reflexed)     Status: Abnormal   Collection Time: 04/13/17  9:43 PM  Result Value Ref Range Status   Enterococcus species NOT DETECTED NOT DETECTED Final   Listeria monocytogenes NOT DETECTED NOT DETECTED Final   Staphylococcus species DETECTED (A) NOT DETECTED Final    Comment: Methicillin (oxacillin) resistant coagulase negative staphylococcus. Possible blood culture contaminant (unless isolated from more than one blood culture draw or clinical case suggests pathogenicity). No antibiotic treatment is indicated for blood  culture contaminants. CRITICAL RESULT CALLED TO, READ BACK BY AND VERIFIED WITH: TO JGRISMELY(PharmD) by [email or web address] 04/14/17 at 10:55am    Staphylococcus aureus NOT DETECTED NOT DETECTED Final   Methicillin resistance DETECTED (A) NOT DETECTED Final    Comment: CRITICAL RESULT CALLED TO, READ BACK BY AND VERIFIED WITH: TO JGRISMELY(PharmD) by [email or web address] 04/14/17 at 10:55am    Streptococcus species NOT DETECTED NOT DETECTED Final   Streptococcus agalactiae NOT DETECTED NOT DETECTED Final   Streptococcus pneumoniae NOT DETECTED NOT DETECTED Final   Streptococcus pyogenes NOT DETECTED NOT DETECTED Final   Acinetobacter baumannii NOT DETECTED NOT DETECTED Final   Enterobacteriaceae species NOT DETECTED NOT DETECTED Final   Enterobacter cloacae complex NOT DETECTED NOT DETECTED Final   Escherichia coli NOT DETECTED NOT DETECTED Final   Klebsiella oxytoca NOT DETECTED NOT DETECTED Final   Klebsiella pneumoniae NOT DETECTED NOT DETECTED Final   Proteus species NOT DETECTED NOT DETECTED Final     Serratia marcescens NOT DETECTED NOT DETECTED Final   Haemophilus influenzae NOT DETECTED NOT DETECTED Final   Neisseria meningitidis NOT DETECTED NOT DETECTED Final   Pseudomonas aeruginosa NOT DETECTED NOT DETECTED Final   Candida albicans NOT  DETECTED NOT DETECTED Final   Candida glabrata NOT DETECTED NOT DETECTED Final   Candida krusei NOT DETECTED NOT DETECTED Final   Candida parapsilosis NOT DETECTED NOT DETECTED Final   Candida tropicalis NOT DETECTED NOT DETECTED Final    Comment: Performed at [facility], [place] [address] [place], [place] [address]  Respiratory Panel by PCR     Status: None   Collection Time: 04/14/17  2:30 PM  Result Value Ref Range Status   Adenovirus NOT DETECTED NOT DETECTED Final   Coronavirus 229E NOT DETECTED NOT DETECTED Final   Coronavirus HKU1 NOT DETECTED NOT DETECTED Final   Coronavirus NL63 NOT DETECTED NOT DETECTED Final   Coronavirus OC43 NOT DETECTED NOT DETECTED Final   Metapneumovirus NOT DETECTED NOT DETECTED Final   Rhinovirus / Enterovirus NOT DETECTED NOT DETECTED Final   Influenza A NOT DETECTED NOT DETECTED Final   Influenza B NOT DETECTED NOT DETECTED Final   Parainfluenza Virus 1 NOT DETECTED NOT DETECTED Final   Parainfluenza Virus 2 NOT DETECTED NOT DETECTED Final   Parainfluenza Virus 3 NOT DETECTED NOT DETECTED Final   Parainfluenza Virus 4 NOT DETECTED NOT DETECTED Final   Respiratory Syncytial Virus NOT DETECTED NOT DETECTED Final   Bordetella pertussis NOT DETECTED NOT DETECTED Final   Chlamydophila pneumoniae NOT DETECTED NOT DETECTED Final   Mycoplasma pneumoniae NOT DETECTED NOT DETECTED Final    Comment: Performed at [facility], [place] [address] [place], [place] [address]     Medications:   . aspirin EC  81 mg Oral Daily  . citalopram  20 mg Oral Daily  . morphine  30 mg Oral Q12H  . oxybutynin  10 mg Oral q morning - 10a  . pantoprazole (PROTONIX) IV  40 mg Intravenous Q12H  . polyethylene glycol   17 g Oral Daily  . pregabalin  200 mg Oral Daily  . rivaroxaban  20 mg Oral QHS   Continuous Infusions: . vancomycin Stopped (04/16/17 2229)     LOS: 4 days   Charlynne Cousins  [facility] Pager [phone]  *Please refer to [email or web address], password [email or web address] to get updated schedule on who will round on this patient, as hospitalists switch teams weekly. If 7PM-7AM, please contact night-coverage at [email or web address], password [email or web address] for any overnight needs.  04/17/2017, 10:10 AM

## 2017-04-18 DIAGNOSIS — Z7901 Long term (current) use of anticoagulants: Secondary | ICD-10-CM

## 2017-04-18 DIAGNOSIS — D6481 Anemia due to antineoplastic chemotherapy: Secondary | ICD-10-CM

## 2017-04-18 DIAGNOSIS — E871 Hypo-osmolality and hyponatremia: Secondary | ICD-10-CM

## 2017-04-18 DIAGNOSIS — R61 Generalized hyperhidrosis: Secondary | ICD-10-CM

## 2017-04-18 DIAGNOSIS — T451X5A Adverse effect of antineoplastic and immunosuppressive drugs, initial encounter: Secondary | ICD-10-CM

## 2017-04-18 DIAGNOSIS — R6510 Systemic inflammatory response syndrome (SIRS) of non-infectious origin without acute organ dysfunction: Secondary | ICD-10-CM

## 2017-04-18 LAB — CREATININE, SERUM: CREATININE: 0.59 mg/dL (ref 0.44–1.00)

## 2017-04-18 MED ORDER — RIVAROXABAN 20 MG PO TABS: 20.0000 mg | ORAL_TABLET | Freq: Every day | ORAL | Status: DC

## 2017-04-18 MED ORDER — PANTOPRAZOLE SODIUM 40 MG PO TBEC: 40.0000 mg | DELAYED_RELEASE_TABLET | Freq: Two times a day (BID) | ORAL | Status: DC

## 2017-04-18 MED ORDER — HEPARIN SOD (PORK) LOCK FLUSH 100 UNIT/ML IV SOLN
500.0000 [IU] | INTRAVENOUS | Status: AC | PRN
  Administered 2017-04-18: 500 [IU]

## 2017-04-18 MED ORDER — LORAZEPAM 1 MG PO TABS: 1.0000 mg | ORAL_TABLET | Freq: Three times a day (TID) | ORAL | 0 refills | Status: DC | PRN

## 2017-04-18 NOTE — Discharge Summary (Signed)
Physician Discharge Summary  TONESHA TSOU PFX:[id number] DOB: 31-Jan-1956 DOA: 04/13/2017  PCP: [name], MD  Admit date: 04/13/2017 Discharge date: 04/18/2017  Time spent: 35* minutes  Recommendations for Outpatient Follow-up:  1. Follow up PCP in 2 weeks 2. Follow up Oncology in one week   Discharge Diagnoses:  Principal Problem:   SIRS (systemic inflammatory response syndrome) (HCC) Active Problems:   Benign essential HTN   Depression   Pancreatic carcinoma metastatic to liver [facility])   Chronic anticoagulation   Hypotension   History of Dvt femoral (deep venous thrombosis) (HCC)   Anemia associated with chemotherapy   Hyponatremia   Pulmonary nodule, right   Discharge Condition: Stable  Diet recommendation: regular diet  Filed Weights   04/14/17 1452  Weight: 115.6 kg (254 lb 13.6 oz)    History of present illness:  62 y.o. female past medical history of metastatic pancreatic cancer to the liver on chemotherapy, DVT on anticoagulation who presents with generalized weakness and malaise, accompanied by chills and dizziness upon standing, he was hypotensive and tachycardic in the ED he was fluid resuscitated 2 L lactic acid was 2.2 chest x-ray showed no pneumonia, CT scan showed no PE UA shows no signs of infection he was started empirically on IV vancomycin and Zosyn    Hospital Course:   SIRS (systemic inflammatory response syndrome) (HCC)/hypotension: Vital signs are stable, her lactic acidosis is clear Differential includes bacteremia versus chemotherapy reaction. Patient was empirically started on vancomycin and Zosyn. Blood culture: 1 out of 2 bottles grew coagulase-negative staph probably a contaminant.,  Blood culture ID reflex panel show methicillin-resistant species  results. Repeat blood cultures from Port-A-Cath ordered on 04/17/2017. This cultures is negative to date. CT scan of the chest does show a 6 mm nodule in the right lung which could reflect  infectious etiology. Patient at this time is afebrile, I called and discussed with Dr. Benay Spice, who recommended stopping antibiotics. He will follow repeat blood  Anxiety and depression: Patient has been having frequent panic attacks, will increase the dose of Ativan to 1 mg every 8 hours when necessary for anxiety/nausea/panic attack  Metastatic Pancreatic carcinoma metastatic to liver [facility]) Continue current pain regimen.   Chronic anticoagulation/ History of Dvt femoral (deep venous thrombosis): Continue Xarelto.  Anemia associated with chemotherapy: Hemoglobin seems to be at baseline we will continue to trend.  She denies any bleeding.  Hypovolemic hyponatremia Likely due to hypovolemia, resolved.  Pulmonary nodule, right Will need further follow-up as an outpatient.  Hypokalemia: Replete     Procedures:  None   Consultations:  Oncology  Discharge Exam: Vitals:   04/18/17 0508 04/18/17 1304  BP: 116/61 116/76  Pulse: 89 86  Resp: 20 20  Temp: 98.1 F (36.7 C) 97.9 F (36.6 C)  SpO2: 97% 100%    General: Appears in no acute distress Cardiovascular: S1-S2, regular Respiratory: Clear to auscultation bilaterally  Discharge Instructions   Discharge Instructions    Diet - low sodium heart healthy   Complete by:  As directed    Increase activity slowly   Complete by:  As directed      Allergies as of 04/18/2017      Reactions   Topamax [topiramate] Other (See Comments)   Stroke like symptoms   Aleve [naproxen Sodium] Hives   Has tolerated Voltaren topical as well as aspirin.   Bee Venom Swelling   Echinacea Hives   Other Other (See Comments)   Feathers cause sinus congestion  Sulfa Antibiotics Hives   Advil [ibuprofen] Hives   Has tolerated Voltaren topical as well as aspirin.      Medication List    STOP taking these medications   acetaminophen 650 MG CR tablet Commonly known as:  TYLENOL   cephALEXin 500 MG capsule Commonly  known as:  KEFLEX   ciprofloxacin 250 MG tablet Commonly known as:  CIPRO   loratadine 10 MG tablet Commonly known as:  CLARITIN   nitrofurantoin (macrocrystal-monohydrate) 100 MG capsule Commonly known as:  MACROBID     TAKE these medications   aspirin EC 81 MG tablet Take 81 mg by mouth daily.   citalopram 20 MG tablet Commonly known as:  CELEXA TAKE ONE (1) TABLET BY MOUTH EVERY DAY   CRANBERRY PLUS VITAMIN C PO Take 1 tablet by mouth daily. 4200 mg   cyclobenzaprine 5 MG tablet Commonly known as:  FLEXERIL Take 1 tablet (5 mg total) by mouth 3 (three) times daily as needed for muscle spasms.   eletriptan 40 MG tablet Commonly known as:  RELPAX Take 1 tablet (40 mg total) by mouth every 2 (two) hours as needed for migraine.   HYDROcodone-acetaminophen 5-325 MG tablet Commonly known as:  NORCO/VICODIN Take 1 tablet by mouth every 6 (six) hours as needed for moderate pain.   HYDROmorphone 4 MG tablet Commonly known as:  DILAUDID Take 0.5 tablets (2 mg total) by mouth every 4 (four) hours as needed for severe pain. What changed:  how much to take   hydrOXYzine 10 MG tablet Commonly known as:  ATARAX/VISTARIL Take 10 mg by mouth every 8 (eight) hours as needed for itching.   insulin lispro 100 UNIT/ML injection Commonly known as:  HUMALOG Inject 2-10 Units into the skin. Sliding Scale   lidocaine-prilocaine cream Commonly known as:  EMLA APPLY 1 APPLICATION TOPICALLY AS NEEDED.APPLY TO PORT A CATH SITE 1 HOUR PRIOR TO NEEDLE STICK.   LORazepam 1 MG tablet Commonly known as:  ATIVAN Take 1 tablet (1 mg total) by mouth every 8 (eight) hours as needed for anxiety or sedation (or nausea). What changed:    medication strength  how much to take  reasons to take this   LYRICA 200 MG capsule Generic drug:  pregabalin Take 200 mg by mouth daily.   morphine 30 MG 12 hr tablet Commonly known as:  MS CONTIN Take 30 mg by mouth every 12 (twelve) hours.    multivitamin with minerals Tabs tablet Take 1 tablet by mouth daily.   multivitamin-lutein Caps capsule Take 1 capsule by mouth daily.   nitroGLYCERIN 0.4 MG SL tablet Commonly known as:  NITROSTAT Place 1 tablet (0.4 mg total) under the tongue every 5 (five) minutes as needed for chest pain.   ondansetron 8 MG disintegrating tablet Commonly known as:  ZOFRAN-ODT Take 1 tablet (8 mg total) by mouth every 8 (eight) hours as needed for nausea or vomiting.   oxybutynin 10 MG 24 hr tablet Commonly known as:  DITROPAN-XL Take 1 tablet (10 mg total) by mouth every morning.   oxyCODONE 5 MG immediate release tablet Commonly known as:  Oxy IR/ROXICODONE TAKE 1 TABLET BY MOUTH EVERY 4 HOURS AS NEEDED FOR SEVERE PAIN   pantoprazole 40 MG tablet Commonly known as:  PROTONIX TAKE ONE (1) TABLET BY MOUTH TWO (2) TIMES DAILY   polyethylene glycol packet Commonly known as:  MIRALAX / GLYCOLAX Take 17 g by mouth daily as needed for mild constipation or moderate constipation.  prochlorperazine 10 MG tablet Commonly known as:  COMPAZINE Take 10 mg by mouth every 6 (six) hours as needed for nausea or vomiting.   simethicone 125 MG chewable tablet Commonly known as:  MYLICON Chew 737-106 mg by mouth 2 (two) times daily as needed for flatulence.   vitamin B-12 1000 MCG tablet Commonly known as:  CYANOCOBALAMIN Take 2,500 mcg by mouth every morning.   XARELTO 20 MG Tabs tablet Generic drug:  rivaroxaban Take 20 mg daily by mouth.      Allergies  Allergen Reactions  . Topamax [Topiramate] Other (See Comments)    Stroke like symptoms  . Aleve [Naproxen Sodium] Hives    Has tolerated Voltaren topical as well as aspirin.  . Bee Venom Swelling  . Echinacea Hives  . Other Other (See Comments)    Feathers cause sinus congestion  . Sulfa Antibiotics Hives  . Advil [Ibuprofen] Hives    Has tolerated Voltaren topical as well as aspirin.      The results of significant diagnostics  from this hospitalization (including imaging, microbiology, ancillary and laboratory) are listed below for reference.    Significant Diagnostic Studies: Dg Chest 2 View  Result Date: 04/13/2017 CLINICAL DATA:  Shortness of breath. EXAM: CHEST  2 VIEW COMPARISON:  Radiograph of February 23, 2017. FINDINGS: Stable cardiomediastinal silhouette. No pneumothorax is noted. Elevated right hemidiaphragm is noted with mild right basilar subsegmental atelectasis. Right internal jugular Port-A-Cath is noted with distal tip in expected position of the SVC. Left lung is clear. No significant pleural effusion is noted. Bony thorax is unremarkable. IMPRESSION: Elevated right hemidiaphragm with mild right basilar subsegmental atelectasis. No other significant abnormality is noted. Electronically Signed   By: Marijo Conception, M.D.   On: 04/13/2017 14:56   Ct Angio Chest Pe W/cm &/or Wo Cm  Result Date: 04/13/2017 CLINICAL DATA:  Pancreatic carcinoma. Referral cancer center for hypotension and malaise. Chills and nausea and vomiting. Chronic RIGHT-sided abdominal pain. EXAM: CT ANGIOGRAPHY CHEST CT ABDOMEN AND PELVIS WITH CONTRAST TECHNIQUE: Multidetector CT imaging of the chest was performed using the standard protocol during bolus administration of intravenous contrast. Multiplanar CT image reconstructions and MIPs were obtained to evaluate the vascular anatomy. Multidetector CT imaging of the abdomen and pelvis was performed using the standard protocol during bolus administration of intravenous contrast. CONTRAST:  188mL ISOVUE-370 IOPAMIDOL (ISOVUE-370) INJECTION 76% COMPARISON:  CT chest abdomen pelvis 03/11/2017 FINDINGS: CTA CHEST FINDINGS Cardiovascular: No filling defects within the pulmonary arteries arteries to suggest acute pulmonary embolism. No significant vascular findings. Normal heart size. No pericardial effusion. Mediastinum/Nodes: No axillary supraclavicular adenopathy. Port in the RIGHT chest wall with  tip in distal SVC. No mediastinal hilar adenopathy. No pericardial fluid. Esophagus normal. Lungs/Pleura: 6 mm RIGHT upper lobe pulmonary nodule is not changed compared to 03/11/2017. Lesions increased in size from 12/06/2016 no new pulmonary nodules. Musculoskeletal: No aggressive osseous lesion Review of the MIP images confirms the above findings. CT ABDOMEN and PELVIS FINDINGS Hepatobiliary: Multiple round low-density lesions in liver parenchyma consistent with hepatic metastasis are not changed in short interval from 03/11/2017. No new lesions. Postcholecystectomy. Pancreas: Lesion head of pancreas measures smaller at 3.7 by 4.9 cm compared to 4.5 x 5.2 cm. There is atrophy duct dilatation the body and tail the pancreas. Spleen: Geographic low attenuation within the medial aspect the spleen is likely a profusion phenomena (image 34, series 4). Adrenals/urinary tract: Adrenal glands and kidneys are normal. The ureters and bladder normal. Stomach/Bowel: Stomach, small  bowel, appendix, and cecum are normal. The colon and rectosigmoid colon are normal. Vascular/Lymphatic: Abdominal aorta is normal caliber. There is no retroperitoneal or periportal lymphadenopathy. No pelvic lymphadenopathy. Reproductive: Post hysterectomy. Other: No peritoneal nodularity Musculoskeletal: No aggressive osseous lesion. Review of the MIP images confirms the above findings. IMPRESSION: Chest Impression: 1. No evidence acute pulmonary embolism. 2. Suspicious RIGHT upper lobe pulmonary nodule not changed in short interval from 03/11/2017. Abdomen / Pelvis Impression: 1. Interval decrease in size of pancreatic mass in short interval follow-up. 2. Stable bilobed hepatic metastasis. 3. No peritoneal metastatic disease or progressive adenopathy. Electronically Signed   By: Suzy Bouchard M.D.   On: 04/13/2017 17:55   Ct Abdomen Pelvis W Contrast  Result Date: 04/13/2017 CLINICAL DATA:  Pancreatic carcinoma. Referral cancer center for  hypotension and malaise. Chills and nausea and vomiting. Chronic RIGHT-sided abdominal pain. EXAM: CT ANGIOGRAPHY CHEST CT ABDOMEN AND PELVIS WITH CONTRAST TECHNIQUE: Multidetector CT imaging of the chest was performed using the standard protocol during bolus administration of intravenous contrast. Multiplanar CT image reconstructions and MIPs were obtained to evaluate the vascular anatomy. Multidetector CT imaging of the abdomen and pelvis was performed using the standard protocol during bolus administration of intravenous contrast. CONTRAST:  182mL ISOVUE-370 IOPAMIDOL (ISOVUE-370) INJECTION 76% COMPARISON:  CT chest abdomen pelvis 03/11/2017 FINDINGS: CTA CHEST FINDINGS Cardiovascular: No filling defects within the pulmonary arteries arteries to suggest acute pulmonary embolism. No significant vascular findings. Normal heart size. No pericardial effusion. Mediastinum/Nodes: No axillary supraclavicular adenopathy. Port in the RIGHT chest wall with tip in distal SVC. No mediastinal hilar adenopathy. No pericardial fluid. Esophagus normal. Lungs/Pleura: 6 mm RIGHT upper lobe pulmonary nodule is not changed compared to 03/11/2017. Lesions increased in size from 12/06/2016 no new pulmonary nodules. Musculoskeletal: No aggressive osseous lesion Review of the MIP images confirms the above findings. CT ABDOMEN and PELVIS FINDINGS Hepatobiliary: Multiple round low-density lesions in liver parenchyma consistent with hepatic metastasis are not changed in short interval from 03/11/2017. No new lesions. Postcholecystectomy. Pancreas: Lesion head of pancreas measures smaller at 3.7 by 4.9 cm compared to 4.5 x 5.2 cm. There is atrophy duct dilatation the body and tail the pancreas. Spleen: Geographic low attenuation within the medial aspect the spleen is likely a profusion phenomena (image 34, series 4). Adrenals/urinary tract: Adrenal glands and kidneys are normal. The ureters and bladder normal. Stomach/Bowel: Stomach, small  bowel, appendix, and cecum are normal. The colon and rectosigmoid colon are normal. Vascular/Lymphatic: Abdominal aorta is normal caliber. There is no retroperitoneal or periportal lymphadenopathy. No pelvic lymphadenopathy. Reproductive: Post hysterectomy. Other: No peritoneal nodularity Musculoskeletal: No aggressive osseous lesion. Review of the MIP images confirms the above findings. IMPRESSION: Chest Impression: 1. No evidence acute pulmonary embolism. 2. Suspicious RIGHT upper lobe pulmonary nodule not changed in short interval from 03/11/2017. Abdomen / Pelvis Impression: 1. Interval decrease in size of pancreatic mass in short interval follow-up. 2. Stable bilobed hepatic metastasis. 3. No peritoneal metastatic disease or progressive adenopathy. Electronically Signed   By: Suzy Bouchard M.D.   On: 04/13/2017 17:55    Microbiology: Recent Results (from the past 240 hour(s))  Urine culture     Status: Abnormal   Collection Time: 04/13/17  2:00 PM  Result Value Ref Range Status   Specimen Description URINE, RANDOM  Final   Special Requests NONE  Final   Culture MULTIPLE SPECIES PRESENT, SUGGEST RECOLLECTION (A)  Final   Report Status 04/15/2017 FINAL  Final  Blood culture (routine x  2)     Status: None (Preliminary result)   Collection Time: 04/13/17  9:40 PM  Result Value Ref Range Status   Specimen Description BLOOD LEFT ANTECUBITAL  Final   Special Requests   Final    BOTTLES DRAWN AEROBIC AND ANAEROBIC Blood Culture adequate volume   Culture   Final    NO GROWTH 4 DAYS Performed at [facility], [place] [address] [place], [place] [address]    Report Status PENDING  Incomplete  Blood culture (routine x 2)     Status: Abnormal   Collection Time: 04/13/17  9:43 PM  Result Value Ref Range Status   Specimen Description BLOOD PORTA CATH  Final   Special Requests   Final    BOTTLES DRAWN AEROBIC AND ANAEROBIC Blood Culture adequate volume   Culture  Setup Time   Final    GRAM  POSITIVE COCCI IN BOTH AEROBIC AND ANAEROBIC BOTTLES to [email or web address](PharmD) by [facility] 04/14/17 AT 10:55PM    Culture (A)  Final    STAPHYLOCOCCUS SPECIES (COAGULASE NEGATIVE) THE SIGNIFICANCE OF ISOLATING THIS ORGANISM FROM A SINGLE SET OF BLOOD CULTURES WHEN MULTIPLE SETS ARE DRAWN IS UNCERTAIN. PLEASE NOTIFY THE MICROBIOLOGY DEPARTMENT WITHIN ONE WEEK IF SPECIATION AND SENSITIVITIES ARE REQUIRED. Performed at [facility], [place] [address] [place], [place] [address]    Report Status 04/16/2017 FINAL  Final  Blood Culture ID Panel (Reflexed)     Status: Abnormal   Collection Time: 04/13/17  9:43 PM  Result Value Ref Range Status   Enterococcus species NOT DETECTED NOT DETECTED Final   Listeria monocytogenes NOT DETECTED NOT DETECTED Final   Staphylococcus species DETECTED (A) NOT DETECTED Final    Comment: Methicillin (oxacillin) resistant coagulase negative staphylococcus. Possible blood culture contaminant (unless isolated from more than one blood culture draw or clinical case suggests pathogenicity). No antibiotic treatment is indicated for blood  culture contaminants. CRITICAL RESULT CALLED TO, READ BACK BY AND VERIFIED WITH: TO JGRISMELY(PharmD) by [email or web address] 04/14/17 at 10:55am    Staphylococcus aureus NOT DETECTED NOT DETECTED Final   Methicillin resistance DETECTED (A) NOT DETECTED Final    Comment: CRITICAL RESULT CALLED TO, READ BACK BY AND VERIFIED WITH: TO JGRISMELY(PharmD) by [email or web address] 04/14/17 at 10:55am    Streptococcus species NOT DETECTED NOT DETECTED Final   Streptococcus agalactiae NOT DETECTED NOT DETECTED Final   Streptococcus pneumoniae NOT DETECTED NOT DETECTED Final   Streptococcus pyogenes NOT DETECTED NOT DETECTED Final   Acinetobacter baumannii NOT DETECTED NOT DETECTED Final   Enterobacteriaceae species NOT DETECTED NOT DETECTED Final   Enterobacter cloacae complex NOT DETECTED NOT DETECTED Final   Escherichia coli NOT DETECTED NOT DETECTED Final    Klebsiella oxytoca NOT DETECTED NOT DETECTED Final   Klebsiella pneumoniae NOT DETECTED NOT DETECTED Final   Proteus species NOT DETECTED NOT DETECTED Final   Serratia marcescens NOT DETECTED NOT DETECTED Final   Haemophilus influenzae NOT DETECTED NOT DETECTED Final   Neisseria meningitidis NOT DETECTED NOT DETECTED Final   Pseudomonas aeruginosa NOT DETECTED NOT DETECTED Final   Candida albicans NOT DETECTED NOT DETECTED Final   Candida glabrata NOT DETECTED NOT DETECTED Final   Candida krusei NOT DETECTED NOT DETECTED Final   Candida parapsilosis NOT DETECTED NOT DETECTED Final   Candida tropicalis NOT DETECTED NOT DETECTED Final    Comment: Performed at [facility], [place]. [address] [place], [place] [address]  Respiratory Panel by PCR     Status: None   Collection  Time: 04/14/17  2:30 PM  Result Value Ref Range Status   Adenovirus NOT DETECTED NOT DETECTED Final   Coronavirus 229E NOT DETECTED NOT DETECTED Final   Coronavirus HKU1 NOT DETECTED NOT DETECTED Final   Coronavirus NL63 NOT DETECTED NOT DETECTED Final   Coronavirus OC43 NOT DETECTED NOT DETECTED Final   Metapneumovirus NOT DETECTED NOT DETECTED Final   Rhinovirus / Enterovirus NOT DETECTED NOT DETECTED Final   Influenza A NOT DETECTED NOT DETECTED Final   Influenza B NOT DETECTED NOT DETECTED Final   Parainfluenza Virus 1 NOT DETECTED NOT DETECTED Final   Parainfluenza Virus 2 NOT DETECTED NOT DETECTED Final   Parainfluenza Virus 3 NOT DETECTED NOT DETECTED Final   Parainfluenza Virus 4 NOT DETECTED NOT DETECTED Final   Respiratory Syncytial Virus NOT DETECTED NOT DETECTED Final   Bordetella pertussis NOT DETECTED NOT DETECTED Final   Chlamydophila pneumoniae NOT DETECTED NOT DETECTED Final   Mycoplasma pneumoniae NOT DETECTED NOT DETECTED Final    Comment: Performed at [facility], [place] [address] [place], [place] [address]  Culture, blood (Routine X 2) w Reflex to ID Panel     Status: None (Preliminary  result)   Collection Time: 04/17/17 10:44 AM  Result Value Ref Range Status   Specimen Description BLOOD RIGHT ANTECUBITAL  Final   Special Requests   Final    BOTTLES DRAWN AEROBIC AND ANAEROBIC Blood Culture adequate volume   Culture   Final    NO GROWTH < 24 HOURS Performed at [facility], [place] [address] [place], [place] [address]    Report Status PENDING  Incomplete  Culture, blood (Routine X 2) w Reflex to ID Panel     Status: None (Preliminary result)   Collection Time: 04/17/17 10:44 AM  Result Value Ref Range Status   Specimen Description BLOOD LEFT ANTECUBITAL  Final   Special Requests   Final    BOTTLES DRAWN AEROBIC AND ANAEROBIC Blood Culture adequate volume   Culture   Final    NO GROWTH < 24 HOURS Performed at [facility], [place] [address], [place], [place] [address]    Report Status PENDING  Incomplete     Labs: Basic Metabolic Panel: Recent Labs  Lab 04/13/17 1219  04/14/17 0439 04/15/17 0359 04/15/17 1445 04/16/17 0333 04/17/17 0420 04/18/17 0952  NA 133*  --  133*  --  134* 135 134*  --   K 3.7  --  3.6  --  3.3* 3.2* 4.0  --   CL 98  --  103  --  103 103 102  --   CO2 21*  --  23  --  24 25 28   --   GLUCOSE 139  --  146*  --  210* 144* 130*  --   BUN 11  --  10  --  6 5* <5*  --   CREATININE  --    < > 0.61 0.61 0.60 0.59 0.54 0.59  CALCIUM 9.6  --  8.3*  --  7.8* 8.3* 8.6*  --   MG 1.6  --   --   --   --   --   --   --    < > = values in this interval not displayed.   Liver Function Tests: Recent Labs  Lab 04/13/17 1219 04/14/17 0439  AST 27 22  ALT 29 23  ALKPHOS 220* 166*  BILITOT 1.4* 0.8  PROT 6.9 6.0*  ALBUMIN  2.8* 2.6*   Recent Labs  Lab 04/13/17 1419  LIPASE 17   No results for input(s): AMMONIA in the last 168 hours. CBC: Recent Labs  Lab 04/13/17 1219 04/14/17 0439 04/15/17 1445 04/17/17 0420  WBC 29.5* 23.8* 14.8* 12.7*  NEUTROABS 26.4*  --   --   --   HGB  --  8.7* 7.7* 8.1*  HCT 31.7* 27.3* 24.4*  25.7*  MCV 85.0 86.4 86.5 86.2  PLT 165 136* 115* 108*   Cardiac Enzymes: Recent Labs  Lab 04/14/17 0439  TROPONINI <0.03   BNP:  CBG: Recent Labs  Lab 04/13/17 1519 04/13/17 1557 04/14/17 1510 04/14/17 2139  GLUCAP 138* 131* 312* 199*       Signed:  Oswald Hillock MD.  [facility] 04/18/2017, 3:24 PM

## 2017-04-18 NOTE — Progress Notes (Signed)
Pharmacy Antibiotic Note  Heather Frederick is a 62 y.o. female with metastatic pancreatic cancer on chemotherapy who presented to the ED on 04/13/2017 with hypotension. Broad abx with vancomycin and zosyn were started on admission for suspected sepsis. Zosyn discontinued 04/16/2017.   Today, 04/18/2017: - day #5 abx - afebrile. WBC elevated, but trending down. SCr 0.59 (CrCl ~ 93 ml/min) - 1/2 blood cx from 1/18 (from Port-A-Cath) with CoNS, methicillin resistance detected - repeat blood cx from Port-A-Cath on 1/22 pending   Plan: - Continue present dose of Vancomycin 1250 mg IV q24h. - Check Vancomycin peak after dose tonight and trough prior to dose tomorrow to determine AUC. Will adjust dose accordingly based on results.  - Monitor renal function, cultures, clinical course.   _______________________________________  Height: 5\' 3"  (160 cm) Weight: 254 lb 13.6 oz (115.6 kg) IBW/kg (Calculated) : 52.4  Temp (24hrs), Avg:98.2 F (36.8 C), Min:98 F (36.7 C), Max:98.4 F (36.9 C)  Recent Labs  Lab 04/13/17 1219 04/13/17 1426 04/13/17 1603  04/14/17 0439 04/15/17 0359 04/15/17 1445 04/16/17 0333 04/17/17 0420 04/18/17 0952  WBC 29.5*  --   --   --  23.8*  --  14.8*  --  12.7*  --   CREATININE  --   --   --    < > 0.61 0.61 0.60 0.59 0.54 0.59  LATICACIDVEN  --  2.60* 0.89  --   --   --   --   --   --   --    < > = values in this interval not displayed.    Estimated Creatinine Clearance: 92.9 mL/min (by C-G formula based on SCr of 0.59 mg/dL).    Allergies  Allergen Reactions  . Topamax [Topiramate] Other (See Comments)    Stroke like symptoms  . Aleve [Naproxen Sodium] Hives    Has tolerated Voltaren topical as well as aspirin.  . Bee Venom Swelling  . Echinacea Hives  . Other Other (See Comments)    Feathers cause sinus congestion  . Sulfa Antibiotics Hives  . Advil [Ibuprofen] Hives    Has tolerated Voltaren topical as well as aspirin.    Antimicrobials this  admission: 1/18 Vanc >> 1/18 Zosyn >> 1/21  Dose adjustments this admission:  Microbiology results: 1/18BCx x 2: 1/2 CoNS, MecA+, MD suspects contaminant 1/18UCx:multiple species, suggest recollection 1/19 resp panel: negative 1/22 BCx x 2 (from Port-A-Cath): NGTD   Thank you for allowing pharmacy to be a part of this patient's care.   Lindell Spar, PharmD, BCPS Pager: [phone] 04/18/2017 8:13 AM

## 2017-04-18 NOTE — Progress Notes (Signed)
Pt discharged to home instructions reviewed, acknowledged understanding. SRP, RN

## 2017-04-19 ENCOUNTER — Encounter (HOSPITAL_COMMUNITY): Payer: Self-pay | Admitting: *Deleted

## 2017-04-19 ENCOUNTER — Other Ambulatory Visit: Payer: Self-pay | Admitting: Medical

## 2017-04-19 ENCOUNTER — Observation Stay (HOSPITAL_COMMUNITY)
Admission: AD | Admit: 2017-04-19 | Discharge: 2017-04-21 | Disposition: A | Discharge status: Home / Self Care | Payer: 59 | Source: Ambulatory Visit | Attending: Internal Medicine | Admitting: Internal Medicine

## 2017-04-19 ENCOUNTER — Other Ambulatory Visit: Payer: Self-pay

## 2017-04-19 ENCOUNTER — Observation Stay (HOSPITAL_COMMUNITY): Payer: 59

## 2017-04-19 DIAGNOSIS — E669 Obesity, unspecified: Secondary | ICD-10-CM | POA: Insufficient documentation

## 2017-04-19 DIAGNOSIS — Z7982 Long term (current) use of aspirin: Secondary | ICD-10-CM | POA: Insufficient documentation

## 2017-04-19 DIAGNOSIS — Z87440 Personal history of urinary (tract) infections: Secondary | ICD-10-CM | POA: Diagnosis not present

## 2017-04-19 DIAGNOSIS — G43909 Migraine, unspecified, not intractable, without status migrainosus: Secondary | ICD-10-CM | POA: Diagnosis not present

## 2017-04-19 DIAGNOSIS — D649 Anemia, unspecified: Secondary | ICD-10-CM | POA: Diagnosis not present

## 2017-04-19 DIAGNOSIS — M62830 Muscle spasm of back: Secondary | ICD-10-CM

## 2017-04-19 DIAGNOSIS — G4733 Obstructive sleep apnea (adult) (pediatric): Secondary | ICD-10-CM | POA: Insufficient documentation

## 2017-04-19 DIAGNOSIS — Z886 Allergy status to analgesic agent status: Secondary | ICD-10-CM | POA: Diagnosis not present

## 2017-04-19 DIAGNOSIS — I129 Hypertensive chronic kidney disease with stage 1 through stage 4 chronic kidney disease, or unspecified chronic kidney disease: Secondary | ICD-10-CM | POA: Diagnosis not present

## 2017-04-19 DIAGNOSIS — Z882 Allergy status to sulfonamides status: Secondary | ICD-10-CM | POA: Insufficient documentation

## 2017-04-19 DIAGNOSIS — Z452 Encounter for adjustment and management of vascular access device: Secondary | ICD-10-CM | POA: Insufficient documentation

## 2017-04-19 DIAGNOSIS — D72829 Elevated white blood cell count, unspecified: Secondary | ICD-10-CM | POA: Diagnosis not present

## 2017-04-19 DIAGNOSIS — I951 Orthostatic hypotension: Principal | ICD-10-CM

## 2017-04-19 DIAGNOSIS — F329 Major depressive disorder, single episode, unspecified: Secondary | ICD-10-CM | POA: Diagnosis not present

## 2017-04-19 DIAGNOSIS — F419 Anxiety disorder, unspecified: Secondary | ICD-10-CM | POA: Diagnosis not present

## 2017-04-19 DIAGNOSIS — M1990 Unspecified osteoarthritis, unspecified site: Secondary | ICD-10-CM | POA: Insufficient documentation

## 2017-04-19 DIAGNOSIS — C787 Secondary malignant neoplasm of liver and intrahepatic bile duct: Secondary | ICD-10-CM | POA: Diagnosis not present

## 2017-04-19 DIAGNOSIS — I7300 Raynaud's syndrome without gangrene: Secondary | ICD-10-CM | POA: Insufficient documentation

## 2017-04-19 DIAGNOSIS — E871 Hypo-osmolality and hyponatremia: Secondary | ICD-10-CM | POA: Insufficient documentation

## 2017-04-19 DIAGNOSIS — C259 Malignant neoplasm of pancreas, unspecified: Secondary | ICD-10-CM | POA: Insufficient documentation

## 2017-04-19 DIAGNOSIS — E1122 Type 2 diabetes mellitus with diabetic chronic kidney disease: Secondary | ICD-10-CM | POA: Insufficient documentation

## 2017-04-19 DIAGNOSIS — Z7901 Long term (current) use of anticoagulants: Secondary | ICD-10-CM | POA: Insufficient documentation

## 2017-04-19 DIAGNOSIS — E785 Hyperlipidemia, unspecified: Secondary | ICD-10-CM | POA: Insufficient documentation

## 2017-04-19 DIAGNOSIS — K219 Gastro-esophageal reflux disease without esophagitis: Secondary | ICD-10-CM | POA: Diagnosis not present

## 2017-04-19 DIAGNOSIS — Z794 Long term (current) use of insulin: Secondary | ICD-10-CM | POA: Diagnosis not present

## 2017-04-19 DIAGNOSIS — I959 Hypotension, unspecified: Secondary | ICD-10-CM | POA: Diagnosis not present

## 2017-04-19 DIAGNOSIS — Z79899 Other long term (current) drug therapy: Secondary | ICD-10-CM | POA: Diagnosis not present

## 2017-04-19 DIAGNOSIS — R0600 Dyspnea, unspecified: Secondary | ICD-10-CM

## 2017-04-19 DIAGNOSIS — N189 Chronic kidney disease, unspecified: Secondary | ICD-10-CM | POA: Insufficient documentation

## 2017-04-19 DIAGNOSIS — D696 Thrombocytopenia, unspecified: Secondary | ICD-10-CM | POA: Diagnosis not present

## 2017-04-19 HISTORY — DX: Secondary malignant neoplasm of liver and intrahepatic bile duct: C78.7

## 2017-04-19 HISTORY — DX: Malignant neoplasm of pancreas, unspecified: C25.9

## 2017-04-19 LAB — CULTURE, BLOOD (ROUTINE X 2)
Culture: NO GROWTH
SPECIAL REQUESTS: ADEQUATE

## 2017-04-19 LAB — COMPREHENSIVE METABOLIC PANEL
ALBUMIN: 2.8 g/dL — AB (ref 3.5–5.0)
ALK PHOS: 173 U/L — AB (ref 38–126)
ALT: 19 U/L (ref 14–54)
AST: 23 U/L (ref 15–41)
Anion gap: 10 (ref 5–15)
BUN: 8 mg/dL (ref 6–20)
CALCIUM: 8.8 mg/dL — AB (ref 8.9–10.3)
CO2: 24 mmol/L (ref 22–32)
CREATININE: 0.65 mg/dL (ref 0.44–1.00)
Chloride: 97 mmol/L — ABNORMAL LOW (ref 101–111)
GFR calc Af Amer: >60 mL/min (ref >60)
GLUCOSE: 166 mg/dL — AB (ref 65–99)
POTASSIUM: 3.4 mmol/L — AB (ref 3.5–5.1)
Sodium: 131 mmol/L — ABNORMAL LOW (ref 135–145)
TOTAL PROTEIN: 6.6 g/dL (ref 6.5–8.1)
Total Bilirubin: 0.4 mg/dL (ref 0.3–1.2)

## 2017-04-19 LAB — SEDIMENTATION RATE: SED RATE: 78 mm/h — AB (ref 0–22)

## 2017-04-19 LAB — CBC
HEMATOCRIT: 28.2 % — AB (ref 36.0–46.0)
Hemoglobin: 9 g/dL — ABNORMAL LOW (ref 12.0–15.0)
MCH: 27.3 pg (ref 26.0–34.0)
MCHC: 31.9 g/dL (ref 30.0–36.0)
MCV: 85.5 fL (ref 78.0–100.0)
PLATELETS: 114 10*3/uL — AB (ref 150–400)
RBC: 3.3 MIL/uL — ABNORMAL LOW (ref 3.87–5.11)
RDW: 19.8 % — AB (ref 11.5–15.5)
WBC: 9.5 10*3/uL (ref 4.0–10.5)

## 2017-04-19 LAB — TROPONIN I

## 2017-04-19 LAB — CORTISOL: CORTISOL PLASMA: 11.5 ug/dL

## 2017-04-19 MED ORDER — PREGABALIN 100 MG PO CAPS: 200.0000 mg | ORAL_CAPSULE | Freq: Every day | ORAL | Status: DC

## 2017-04-19 MED ORDER — HYDROCODONE-ACETAMINOPHEN 5-325 MG PO TABS
1.0000 | ORAL_TABLET | Freq: Four times a day (QID) | ORAL | Status: DC | PRN
  Administered 2017-04-19 – 2017-04-21 (×4): 1 via ORAL
  Filled 2017-04-19 (×5): qty 1

## 2017-04-19 MED ORDER — HYDROXYZINE HCL 10 MG PO TABS
10.0000 mg | ORAL_TABLET | Freq: Three times a day (TID) | ORAL | Status: DC | PRN
Start: 2017-04-19 — End: 2017-04-21
  Filled 2017-04-19: qty 1

## 2017-04-19 MED ORDER — CITALOPRAM HYDROBROMIDE 20 MG PO TABS
20.0000 mg | ORAL_TABLET | Freq: Every day | ORAL | Status: DC
  Administered 2017-04-19 – 2017-04-21 (×3): 20 mg via ORAL
  Filled 2017-04-19 (×3): qty 1

## 2017-04-19 MED ORDER — ACETAMINOPHEN 325 MG PO TABS: 650.0000 mg | ORAL_TABLET | Freq: Four times a day (QID) | ORAL | Status: DC | PRN

## 2017-04-19 MED ORDER — PREGABALIN 100 MG PO CAPS
200.0000 mg | ORAL_CAPSULE | Freq: Every day | ORAL | Status: DC
  Administered 2017-04-19 – 2017-04-21 (×3): 200 mg via ORAL
  Filled 2017-04-19 (×3): qty 2

## 2017-04-19 MED ORDER — SODIUM CHLORIDE 0.9 % IV SOLN
INTRAVENOUS | Status: AC
  Administered 2017-04-19: 19:00:00 via INTRAVENOUS

## 2017-04-19 MED ORDER — OCUVITE-LUTEIN PO CAPS
1.0000 | ORAL_CAPSULE | Freq: Every day | ORAL | Status: DC
  Administered 2017-04-20: 1 via ORAL
  Filled 2017-04-19 (×2): qty 1

## 2017-04-19 MED ORDER — MORPHINE SULFATE ER 30 MG PO TBCR
30.0000 mg | EXTENDED_RELEASE_TABLET | Freq: Two times a day (BID) | ORAL | Status: DC
  Administered 2017-04-19 – 2017-04-21 (×4): 30 mg via ORAL
  Filled 2017-04-19 (×4): qty 1

## 2017-04-19 MED ORDER — RIVAROXABAN 20 MG PO TABS
20.0000 mg | ORAL_TABLET | Freq: Every day | ORAL | Status: DC
  Administered 2017-04-19 – 2017-04-20 (×2): 20 mg via ORAL
  Filled 2017-04-19 (×2): qty 1

## 2017-04-19 MED ORDER — ACETAMINOPHEN 650 MG RE SUPP: 650.0000 mg | Freq: Four times a day (QID) | RECTAL | Status: DC | PRN

## 2017-04-19 MED ORDER — PANTOPRAZOLE SODIUM 40 MG PO TBEC
40.0000 mg | DELAYED_RELEASE_TABLET | Freq: Two times a day (BID) | ORAL | Status: DC
  Administered 2017-04-19 – 2017-04-21 (×4): 40 mg via ORAL
  Filled 2017-04-19 (×4): qty 1

## 2017-04-19 MED ORDER — SODIUM CHLORIDE 0.9% FLUSH
10.0000 mL | INTRAVENOUS | Status: DC | PRN
  Administered 2017-04-20: 30 mL
  Filled 2017-04-19: qty 40

## 2017-04-19 MED ORDER — OXYBUTYNIN CHLORIDE ER 5 MG PO TB24
10.0000 mg | ORAL_TABLET | Freq: Every morning | ORAL | Status: DC
  Administered 2017-04-20 – 2017-04-21 (×2): 10 mg via ORAL
  Filled 2017-04-19 (×2): qty 2

## 2017-04-19 MED ORDER — ASPIRIN EC 81 MG PO TBEC
81.0000 mg | DELAYED_RELEASE_TABLET | Freq: Every day | ORAL | Status: DC
  Administered 2017-04-20 – 2017-04-21 (×2): 81 mg via ORAL
  Filled 2017-04-19 (×2): qty 1

## 2017-04-19 NOTE — H&P (Addendum)
TRH H&P   Patient Demographics:    Heather Frederick, is a 62 y.o. female  MRN: [id number]   DOB - 1955/12/14  Admit Date - 04/19/2017  Outpatient Primary MD for the patient is [name], MD  Referring MD/NP/PA:     Outpatient Specialists:   Kavin Leech  (oncology)  Patient coming from: home=> pcp office  No chief complaint on file.  hypotension   HPI:    Heather Frederick  is a 62 y.o. female, w Dm2, Hypertension, Hyperlipidemia, OSA, Gerd, Anemia, metastatic pancreatic cancer, w recent admission for sepsis apparently presented due to c/o anxiety at PCP office, and was noted to have sbp 80. Pt denies cp, palp, sob, n/v, diarrhea, dysuria, hematuria.   Pt sent for direct admission.       Review of systems:    In addition to the HPI above,  No Fever-chills, No Headache, No changes with Vision or hearing, No problems swallowing food or Liquids, No Chest pain, Cough .  + dyspnea with panick attacks No Abdominal pain, No Nausea or Vommitting, Bowel movements are regular, No Blood in stool or Urine, No dysuria, No new skin rashes or bruises, No new joints pains-aches,  No new weakness, tingling, numbness in any extremity, No recent weight gain or loss, No polyuria, polydypsia or polyphagia, +  significant Mental Stressors.  A full 10 point Review of Systems was done, except as stated above, all other Review of Systems were negative.   With Past History of the following :    Past Medical History:  Diagnosis Date  . Allergy   . Anxiety   . Arthritis   . Benign essential HTN 09/23/2014  . Cancer ([place])   . Chronic kidney disease    uti  . Depression   . Dyslipidemia   . Elevated liver enzymes   . Family history of anesthesia complication    father has a severe hard time waking up  . Gallstones    a. Seen on CT 01/2014.  Marland Kitchen GERD (gastroesophageal reflux disease)    . Hard of hearing   . Hepatic steatosis   . History of frequent urinary tract infections   . Hyperlipidemia   . Hypertension   . Lateral epicondylitis of right elbow   . Mental disorder   . Meralgia paresthetica of right side 10/02/2012   slight at 05/2014  . Migraine headache   . Obesity   . OSA (obstructive sleep apnea)    severe with AHI 37/hr now on CPAP at 18cm H2O  . Osteoarthritis   . Pneumonia    Feb 2018  . Raynaud disease    in feet per patient   . Sepsis ([place]) 02/23/2017  . Sleep apnea    wears c-pap  . Urinary tract infection       Past Surgical History:  Procedure Laterality Date  . ABDOMINAL HYSTERECTOMY  1/04   partial  . BLADDER SUSPENSION  6/10  . CARDIAC CATHETERIZATION    . carpel tunnel left/right  7/08, 8/08 Bilateral 8/08 and 7/08  . carpel tunnel rel Right 4/12  . CHOLECYSTECTOMY N/A 06/11/2014   Procedure: LAPAROSCOPIC CHOLECYSTECTOMY WITH ATTEMPTED INTRAOPERATIVE CHOLANGIOGRAM;  Surgeon: Jackolyn Confer, MD;  Location: [facility];  Service: General;  Laterality: N/A;  . COLONOSCOPY    . ERCP N/A 10/19/2014   Procedure: ENDOSCOPIC RETROGRADE CHOLANGIOPANCREATOGRAPHY (ERCP);  Surgeon: Ladene Artist, MD;  Location: [facility];  Service: Endoscopy;  Laterality: N/A;  . INCISIONAL HERNIA REPAIR N/A 06/30/2016   Procedure: LAPAROSCOPIC REPAIR OF INCISIONAL HERNIA WITH MESH;  Surgeon: Jackolyn Confer, MD;  Location: [facility];  Service: General;  Laterality: N/A;  . INSERTION OF MESH N/A 06/30/2016   Procedure: INSERTION OF MESH;  Surgeon: Jackolyn Confer, MD;  Location: [facility];  Service: General;  Laterality: N/A;  . IR CV LINE INJECTION  11/24/2016  . IR FLUORO GUIDE PORT INSERTION RIGHT  08/28/2016  . IR US GUIDE VASC ACCESS RIGHT  08/28/2016  . JOINT REPLACEMENT    . KNEE ARTHROSCOPY Left 12/12  . KNEE ARTHROSCOPY Right 12/06  . LEFT HEART CATHETERIZATION WITH CORONARY ANGIOGRAM N/A 03/10/2014   Procedure: LEFT HEART CATHETERIZATION WITH CORONARY ANGIOGRAM;   Surgeon: Sinclair Grooms, MD;  Location: [facility] CATH [facility];  Service: Cardiovascular;  Laterality: N/A;  . PLANTAR FASCIA RELEASE Right 12/10  . radial tunnel release     right arm   . ROTATOR CUFF REPAIR Left 6/11  . tennis elbow release Right 7/04  . TOTAL KNEE ARTHROPLASTY Left 09/10/2012   Procedure: TOTAL KNEE ARTHROPLASTY- left;  Surgeon: Garald Balding, MD;  Location: [place];  Service: Orthopedics;  Laterality: Left;  Left total knee arthroplasty      Social History:     Social History   Tobacco Use  . Smoking status: Never Smoker  . Smokeless tobacco: Never Used  . Tobacco comment: Secondhand - from family growing up, workplace intermittently  Substance Use Topics  . Alcohol use: Yes    Alcohol/week: 0.0 oz    Comment: occasionally - intermittent, no more than twice a week     Lives - at home  Mobility - walks by self   Family History :     Family History  Problem Relation Age of Onset  . Hypertension Mother   . Stroke Mother   . Liver disease Mother        Abcess  . Hypertension Father   . Coronary artery disease Father   . Migraines Father   . Clotting disorder Father   . Kidney failure Brother   . Hypertension Brother   . Migraines Brother   . Migraines Daughter   . Breast cancer Other        Niece with breast cancer  . Colon cancer Paternal Grandmother   . Pancreatic cancer Paternal Grandmother   . Stomach cancer Paternal Grandmother   . Breast cancer Cousin   . Esophageal cancer Neg Hx   . Rectal cancer Neg Hx       Home Medications:   Prior to Admission medications   Medication Sig Start Date End Date Taking? Authorizing Provider  aspirin EC 81 MG tablet Take 81 mg by mouth daily.   Yes [provider]  citalopram (CELEXA) 20 MG tablet TAKE ONE (1) TABLET BY MOUTH EVERY DAY 01/22/17  Yes Hoyt Koch, MD  Cranberry-Vitamin C-Vitamin E (  CRANBERRY PLUS VITAMIN C PO) Take 1 tablet by mouth daily. 4200 mg   Yes [provider]  cyclobenzaprine (FLEXERIL) 5 MG tablet TAKE ONE (1) TABLET BY MOUTH 3 TIMES DAILY AS NEEDED FOR MUSCLE SPASMS 04/19/17  Yes Ladell Pier, MD  HYDROcodone-acetaminophen (NORCO/VICODIN) 5-325 MG tablet Take 1 tablet by mouth every 6 (six) hours as needed for moderate pain.   Yes [provider]  HYDROmorphone (DILAUDID) 4 MG tablet Take 0.5 tablets (2 mg total) by mouth every 4 (four) hours as needed for severe pain. Patient taking differently: Take 4-8 mg by mouth every 4 (four) hours as needed for severe pain.  03/14/17  Yes Charlynne Cousins, MD  insulin detemir (LEVEMIR) 100 unit/ml SOLN Inject 10 Units into the skin at bedtime.   Yes [provider]  insulin lispro (HUMALOG) 100 UNIT/ML injection Inject 2-10 Units into the skin. Sliding Scale   Yes [provider]  LORazepam (ATIVAN) 1 MG tablet Take 1 tablet (1 mg total) by mouth every 8 (eight) hours as needed for anxiety or sedation (or nausea). 04/18/17  Yes Lama, Marge Duncans, MD  LYRICA 200 MG capsule Take 200 mg by mouth daily. 04/03/17  Yes [provider]  morphine (MS CONTIN) 30 MG 12 hr tablet Take 30 mg by mouth every 12 (twelve) hours. 03/20/17  Yes [provider]  multivitamin-lutein (OCUVITE-LUTEIN) CAPS capsule Take 1 capsule by mouth daily.   Yes [provider]  ondansetron (ZOFRAN-ODT) 8 MG disintegrating tablet Take 1 tablet (8 mg total) by mouth every 8 (eight) hours as needed for nausea or vomiting. 09/18/16  Yes Curcio, Roselie Awkward, NP  oxybutynin (DITROPAN-XL) 10 MG 24 hr tablet Take 1 tablet (10 mg total) by mouth every morning. 03/19/17  Yes Ladell Pier, MD  pantoprazole (PROTONIX) 40 MG tablet TAKE ONE (1) TABLET BY MOUTH TWO (2) TIMES DAILY 09/25/16  Yes Ladene Artist, MD  vitamin B-12 (CYANOCOBALAMIN) 1000 MCG tablet Take 2,500 mcg by mouth every morning.   Yes [provider]  XARELTO 20 MG TABS tablet Take 20 mg daily by mouth. 01/31/17   Yes [provider]  eletriptan (RELPAX) 40 MG tablet Take 1 tablet (40 mg total) by mouth every 2 (two) hours as needed for migraine. 06/26/16   Dennie Bible, NP  hydrOXYzine (ATARAX/VISTARIL) 10 MG tablet Take 10 mg by mouth every 8 (eight) hours as needed for itching.    [provider]  lidocaine-prilocaine (EMLA) cream APPLY 1 APPLICATION TOPICALLY AS NEEDED.APPLY TO PORT A CATH SITE 1 HOUR PRIOR TO NEEDLE STICK. 03/26/17   Ladell Pier, MD  nitroGLYCERIN (NITROSTAT) 0.4 MG SL tablet Place 1 tablet (0.4 mg total) under the tongue every 5 (five) minutes as needed for chest pain. 06/28/15   Belva Crome, MD  oxyCODONE (OXY IR/ROXICODONE) 5 MG immediate release tablet TAKE 1 TABLET BY MOUTH EVERY 4 HOURS AS NEEDED FOR SEVERE PAIN 04/03/17   Owens Shark, NP  prochlorperazine (COMPAZINE) 10 MG tablet Take 10 mg by mouth every 6 (six) hours as needed for nausea or vomiting.    [provider]  simethicone (MYLICON) 161 MG chewable tablet Chew 125-250 mg by mouth 2 (two) times daily as needed for flatulence.    [provider]     Allergies:     Allergies  Allergen Reactions  . Topamax [Topiramate] Other (See Comments)    Stroke like symptoms  . Aleve [Naproxen Sodium]  Hives    Has tolerated Voltaren topical as well as aspirin.  . Bee Venom Swelling  . Echinacea Hives  . Other Other (See Comments)    Feathers cause sinus congestion  . Sulfa Antibiotics Hives  . Advil [Ibuprofen] Hives    Has tolerated Voltaren topical as well as aspirin.     Physical Exam:   Vitals  Blood pressure (!) 90/59, pulse 99, temperature 99.3 F (37.4 C), temperature source Oral, resp. rate 16, height '5\' 3"'  (1.6 m), SpO2 97 %.   1. General  lying in bed in NAD,   + anxious  2. Normal affect and insight, Not Suicidal or Homicidal, Awake Alert, Oriented X 3.  3. No F.N deficits, ALL C.Nerves Intact, Strength 5/5 all 4 extremities, Sensation intact all 4  extremities, Plantars down going.  4. Ears and Eyes appear Normal, Conjunctivae clear, PERRLA. Moist Oral Mucosa.  5. Supple Neck, No JVD, No cervical lymphadenopathy appriciated, No Carotid Bruits.  6. Symmetrical Chest wall movement, Good air movement bilaterally, CTAB.  7. RRR, No Gallops, Rubs or Murmurs, No Parasternal Heave.  8. Positive Bowel Sounds, Abdomen Soft, No tenderness, No organomegaly appriciated,No rebound -guarding or rigidity.  9.  No Cyanosis, Normal Skin Turgor, No Skin Rash or Bruise.  10. Good muscle tone,  joints appear normal , no effusions, Normal ROM.  11. No Palpable Lymph Nodes in Neck or Axillae     Data Review:    CBC Recent Labs  Lab 04/13/17 1219 04/14/17 0439 04/15/17 1445 04/17/17 0420 04/19/17 1858  WBC 29.5* 23.8* 14.8* 12.7* 9.5  HGB  --  8.7* 7.7* 8.1* 9.0*  HCT 31.7* 27.3* 24.4* 25.7* 28.2*  PLT 165 136* 115* 108* 114*  MCV 85.0 86.4 86.5 86.2 85.5  MCH 27.3 27.5 27.3 27.2 27.3  MCHC 32.2 31.9 31.6 31.5 31.9  RDW 18.6* 18.7* 18.8* 19.2* 19.8*  LYMPHSABS 2.1  --   --   --   --   MONOABS 0.6  --   --   --   --   EOSABS 0.5  --   --   --   --   BASOSABS 0.0  --   --   --   --    ------------------------------------------------------------------------------------------------------------------  Chemistries  Recent Labs  Lab 04/13/17 1219 04/14/17 0439  04/15/17 1445 04/16/17 0333 04/17/17 0420 04/18/17 0952 04/19/17 1858  NA 133* 133*  --  134* 135 134*  --  131*  K 3.7 3.6  --  3.3* 3.2* 4.0  --  3.4*  CL 98 103  --  103 103 102  --  97*  CO2 21* 23  --  '24 25 28  ' --  24  GLUCOSE 139 146*  --  210* 144* 130*  --  166*  BUN 11 10  --  6 5* <5*  --  8  CREATININE  --  0.61   < > 0.60 0.59 0.54 0.59 0.65  CALCIUM 9.6 8.3*  --  7.8* 8.3* 8.6*  --  8.8*  MG 1.6  --   --   --   --   --   --   --   AST 27 22  --   --   --   --   --  23  ALT 29 23  --   --   --   --   --  19  ALKPHOS 220* 166*  --   --   --   --   --  173*   BILITOT 1.4* 0.8  --   --   --   --   --  0.4   < > = values in this interval not displayed.   ------------------------------------------------------------------------------------------------------------------ estimated creatinine clearance is 92.9 mL/min (by C-G formula based on SCr of 0.65 mg/dL). ------------------------------------------------------------------------------------------------------------------ No results for input(s): TSH, T4TOTAL, T3FREE, THYROIDAB in the last 72 hours.  Invalid input(s): FREET3  Coagulation profile No results for input(s): INR, PROTIME in the last 168 hours. ------------------------------------------------------------------------------------------------------------------- No results for input(s): DDIMER in the last 72 hours. -------------------------------------------------------------------------------------------------------------------  Cardiac Enzymes Recent Labs  Lab 04/14/17 0439 04/19/17 1858  TROPONINI <0.03 <0.03   ------------------------------------------------------------------------------------------------------------------ No results found for: BNP   ---------------------------------------------------------------------------------------------------------------  Urinalysis    Component Value Date/Time   COLORURINE YELLOW 04/13/2017 2348   APPEARANCEUR HAZY (A) 04/13/2017 2348   LABSPEC 1.032 (H) 04/13/2017 2348   LABSPEC 1.005 03/28/2017 1319   PHURINE 8.0 04/13/2017 2348   GLUCOSEU NEGATIVE 04/13/2017 2348   GLUCOSEU Negative 03/28/2017 1319   HGBUR NEGATIVE 04/13/2017 2348   BILIRUBINUR NEGATIVE 04/13/2017 2348   BILIRUBINUR Negative 03/28/2017 1319   KETONESUR 20 (A) 04/13/2017 2348   PROTEINUR NEGATIVE 04/13/2017 2348   UROBILINOGEN 0.2 03/28/2017 1319   NITRITE NEGATIVE 04/13/2017 2348   LEUKOCYTESUR TRACE (A) 04/13/2017 2348   LEUKOCYTESUR Trace 03/28/2017 1319     ----------------------------------------------------------------------------------------------------------------   Imaging Results:    Dg Chest 2 View  Result Date: 04/19/2017 CLINICAL DATA:  Hypotension and history of pancreatic carcinoma EXAM: CHEST  2 VIEW COMPARISON:  04/13/2017 FINDINGS: Right-sided chest wall port is again seen. Elevation the right hemidiaphragm is again noted. The lungs are well aerated bilaterally. Minimal right basilar atelectasis is again seen. No new focal abnormality is noted. IMPRESSION: Stable right basilar atelectasis. Electronically Signed   By: Inez Catalina M.D.   On: 04/19/2017 20:49       Assessment & Plan:    Active Problems:   Hypotension   Anemia   Thrombocytopenia (HCC)   Leukocytosis   Hypotension r/o sepsis Tele Trop I q6h x3 Cortisol Check cardiac echo CXR Urinalysis Blood culture x2 ESR Hydrate with NS iv Hold pain medication  Dyspnea / Panick attacks Cardiac echo Pulmonary consult in am for evaluation of dyspnea  Anemia improved Check cbc in am  Anxiety Hold lorazepam Cont celexa  Gerd Cont protonix  Dm2 fsbs ac and qhs iss Cont levemir  H/o DVT (left cephalic vein) Cont xarelto   DVT Prophylaxis  Xarelto  AM Labs Ordered, also please review Full Orders  Family Communication: Admission, patients condition and plan of care including tests being ordered have been discussed with the patient  who indicate understanding and agree with the plan and Code Status.  Code Status FULL Code  Likely DC to  home  Condition GUARDED   Consults called: none  Admission status: observation   Time spent in minutes : 45   [name] M.D on 04/19/2017 at 9:13 PM  Between 7am to 7pm - Pager - [phone]   . After 7pm go to [email or web address] - password [facility]  [facility] - Office  [phone]

## 2017-04-20 ENCOUNTER — Other Ambulatory Visit: Payer: Self-pay

## 2017-04-20 ENCOUNTER — Encounter (HOSPITAL_COMMUNITY): Payer: Self-pay | Admitting: Internal Medicine

## 2017-04-20 ENCOUNTER — Observation Stay (HOSPITAL_COMMUNITY): Payer: 59

## 2017-04-20 ENCOUNTER — Telehealth: Payer: Self-pay | Admitting: *Deleted

## 2017-04-20 DIAGNOSIS — I950 Idiopathic hypotension: Secondary | ICD-10-CM

## 2017-04-20 DIAGNOSIS — F419 Anxiety disorder, unspecified: Secondary | ICD-10-CM | POA: Diagnosis not present

## 2017-04-20 DIAGNOSIS — R0600 Dyspnea, unspecified: Secondary | ICD-10-CM

## 2017-04-20 DIAGNOSIS — C251 Malignant neoplasm of body of pancreas: Secondary | ICD-10-CM

## 2017-04-20 DIAGNOSIS — R509 Fever, unspecified: Secondary | ICD-10-CM | POA: Diagnosis not present

## 2017-04-20 DIAGNOSIS — I959 Hypotension, unspecified: Secondary | ICD-10-CM | POA: Diagnosis not present

## 2017-04-20 DIAGNOSIS — R000 Tachycardia, unspecified: Secondary | ICD-10-CM | POA: Diagnosis not present

## 2017-04-20 DIAGNOSIS — I951 Orthostatic hypotension: Secondary | ICD-10-CM | POA: Diagnosis not present

## 2017-04-20 DIAGNOSIS — D649 Anemia, unspecified: Secondary | ICD-10-CM

## 2017-04-20 DIAGNOSIS — I479 Paroxysmal tachycardia, unspecified: Secondary | ICD-10-CM

## 2017-04-20 DIAGNOSIS — C787 Secondary malignant neoplasm of liver and intrahepatic bile duct: Secondary | ICD-10-CM | POA: Diagnosis not present

## 2017-04-20 DIAGNOSIS — R0609 Other forms of dyspnea: Secondary | ICD-10-CM

## 2017-04-20 DIAGNOSIS — F329 Major depressive disorder, single episode, unspecified: Secondary | ICD-10-CM | POA: Diagnosis not present

## 2017-04-20 DIAGNOSIS — G4733 Obstructive sleep apnea (adult) (pediatric): Secondary | ICD-10-CM

## 2017-04-20 LAB — URINALYSIS, ROUTINE W REFLEX MICROSCOPIC
BACTERIA UA: NONE SEEN
BILIRUBIN URINE: NEGATIVE
Glucose, UA: NEGATIVE mg/dL
KETONES UR: NEGATIVE mg/dL
LEUKOCYTES UA: NEGATIVE
Nitrite: NEGATIVE
PH: 6 (ref 5.0–8.0)
Protein, ur: NEGATIVE mg/dL
SPECIFIC GRAVITY, URINE: 1.003 — AB (ref 1.005–1.030)

## 2017-04-20 LAB — CBC
HCT: 27.6 % — ABNORMAL LOW (ref 36.0–46.0)
Hemoglobin: 8.5 g/dL — ABNORMAL LOW (ref 12.0–15.0)
MCH: 26.9 pg (ref 26.0–34.0)
MCHC: 30.8 g/dL (ref 30.0–36.0)
MCV: 87.3 fL (ref 78.0–100.0)
PLATELETS: 127 10*3/uL — AB (ref 150–400)
RBC: 3.16 MIL/uL — ABNORMAL LOW (ref 3.87–5.11)
RDW: 19.7 % — AB (ref 11.5–15.5)
WBC: 11.2 10*3/uL — AB (ref 4.0–10.5)

## 2017-04-20 LAB — TROPONIN I: Troponin I: <0.03 ng/mL (ref <0.03)

## 2017-04-20 LAB — TSH: TSH: 3.158 u[IU]/mL (ref 0.350–4.500)

## 2017-04-20 LAB — GLUCOSE, CAPILLARY: GLUCOSE-CAPILLARY: 140 mg/dL — AB (ref 65–99)

## 2017-04-20 MED ORDER — PIPERACILLIN-TAZOBACTAM 3.375 G IVPB: 3.3750 g | Freq: Three times a day (TID) | INTRAVENOUS | Status: DC

## 2017-04-20 MED ORDER — POLYETHYLENE GLYCOL 3350 17 G PO PACK
17.0000 g | PACK | Freq: Every day | ORAL | Status: DC
  Administered 2017-04-20: 17 g via ORAL
  Filled 2017-04-20: qty 1

## 2017-04-20 MED ORDER — SODIUM CHLORIDE 0.9 % IV SOLN
INTRAVENOUS | Status: DC
  Administered 2017-04-20 – 2017-04-21 (×2): via INTRAVENOUS

## 2017-04-20 MED ORDER — VANCOMYCIN HCL 10 G IV SOLR
2000.0000 mg | Freq: Once | INTRAVENOUS | Status: DC
  Filled 2017-04-20: qty 2000

## 2017-04-20 MED ORDER — VANCOMYCIN HCL 10 G IV SOLR: 1500.0000 mg | INTRAVENOUS | Status: DC

## 2017-04-20 NOTE — Progress Notes (Signed)
IP PROGRESS NOTE  Subjective:   Heather Frederick was discharged 04/18/2017 after admission with symptoms of possible sepsis.  A single blood culture from the Port-A-Cath grew a coagulase-negative Staphylococcus. She was readmitted yesterday with hypotension.  She reports intermittent episodes of dizziness, diaphoresis, and generalized weakness.  No fever.  She continues to have pain in the right upper abdomen.  No diarrhea.  Objective: Vital signs in last 24 hours: Blood pressure (!) 85/37, pulse 82, temperature 97.9 F (36.6 C), temperature source Oral, resp. rate 20, height '5\' 3"'  (1.6 m), weight 238 lb (108 kg), SpO2 92 %.  Intake/Output from previous day: 01/24 0701 - 01/25 0700 In: 1070 [I.V.:1070] Out: 400 [Urine:400]  Physical Exam:  HEENT: No thrush or ulcers Lungs: Clear bilaterally, no respiratory distress Cardiac: Regular rate and rhythm Abdomen: No hepatomegaly, no mass Extremities: No leg edema   Portacath/PICC-without erythema or tenderness  Lab Results: Recent Labs    04/19/17 1858  WBC 9.5  HGB 9.0*  HCT 28.2*  PLT 114*    BMET Recent Labs    04/18/17 0952 04/19/17 1858  NA  --  131*  K  --  3.4*  CL  --  97*  CO2  --  24  GLUCOSE  --  166*  BUN  --  8  CREATININE 0.59 0.65  CALCIUM  --  8.8*    No results found for: CEA1  Studies/Results: Dg Chest 2 View  Result Date: 04/20/2017 CLINICAL DATA:  History of metastatic pancreatic cancer. Low blood pressure. EXAM: CHEST  2 VIEW COMPARISON:  04/19/2017 FINDINGS: Stable right-sided injectable port. Cardiomediastinal silhouette is normal. Mediastinal contours appear intact. There is no evidence of focal airspace consolidation, pleural effusion or pneumothorax. Chronic elevation of the hemidiaphragms with atelectatic changes at the lung bases. Osseous structures are without acute abnormality. Soft tissues are grossly normal. IMPRESSION: Low lung volume with chronic elevation of the hemidiaphragms and  atelectatic changes at the lung bases. Electronically Signed   By: Fidela Salisbury M.D.   On: 04/20/2017 12:17   Dg Chest 2 View  Result Date: 04/19/2017 CLINICAL DATA:  Hypotension and history of pancreatic carcinoma EXAM: CHEST  2 VIEW COMPARISON:  04/13/2017 FINDINGS: Right-sided chest wall port is again seen. Elevation the right hemidiaphragm is again noted. The lungs are well aerated bilaterally. Minimal right basilar atelectasis is again seen. No new focal abnormality is noted. IMPRESSION: Stable right basilar atelectasis. Electronically Signed   By: Inez Catalina M.D.   On: 04/19/2017 20:49    Medications: I have reviewed the patient's current medications.  Assessment/Plan:  1. Metastatic pancreas cancer ? Pancreas body mass and liver metastases noted on CT abdomen/pelvis 08/11/2016 ? Ultrasound-guided biopsy of a right liver lesion 08/16/2016 revealed poorly differentiated adenocarcinoma consistent with pancreas cancer ? Foundation 1-microsatellite stable; tumor mutational burden 1; ERBB2 amplification ? Cycle 1 gemcitabine/Abraxane 09/13/2016; 09/29/2016 ? Cycle 2 gemcitabine/Abraxane 10/11/2016, 10/18/2016 ? Cycle 3 gemcitabine/Abraxane 11/01/2016, 11/08/2016 ? Cycle 4 gemcitabine/Abraxane 11/23/2016,11/29/2016 ? CT chest 12/06/2016-liver lesions appear smaller ? Cycle 5 gemcitabine/Abraxane 12/13/2016 ? CT abdomen/pelvis 01/01/2017-new and enlarging hepatic masses. Enlarging pancreatic mass. ? Cycle 6 gemcitabine/Abraxane 01/03/2017 ? Rising CA 19-9, increased pain October 2018 ? Cycle 1 FOLFOX 01/31/2017 ? Cycle 2 FOLFOX 02/19/2017 ? Cycle 3FOLFIRINOX 03/06/2017 ? CT 03/11/2017 (compared to 01/01/2017) increased liver lesions and enlargement of the pancreas mass ? Cycle4FOLFIRINOX 03/21/2017 (oxaliplatin dose reduced and Neulasta added) ? Cycle 5 FOLFIRINOX 04/09/2017  2. Pain secondary to pancreas cancer, MS Contin  added 03/13/2017  3. Hypertension  4. Sleep  apnea  5. Chronic low back pain  6. Recurrent urinary tract infections  7. Depression  8. Migraine headaches  9. Port-A-Cath placement 08/28/2016  10. Rash following cycle 1 gemcitabine/Abraxane-drug rash?  11. Nausea/vomiting following cycle 1 gemcitabine/Abraxane-antiemetic regimen adjusted with addition of Aloxi  12. Diabetes  13. Right cephalic vein thrombosis [id number]/22/[id number] with Xarelto  14. CT chest 12/06/2016 done to evaluate dyspnea-several 3-4 mm nodular opacities in the lung parenchyma etiology uncertain. Known mass in the body of the pancreas measuring 3.3 x 2.4 cm;small enhancing lesion in the anterior dome of the liver measures 8 mm.  15. Right forearm rash following cycle 5 day 8 gemcitabine/Abraxane. Resolved  16.Klebsiella urinary tract infection 03/14/2017  17.  Admission 04/13/2017 with diaphoresis and tachycardia-resolved, 1 blood culture positive for a coagulase-negative Staphylococcus  18.  Admission 04/19/2017 with recurrent hypotension  The etiology of her symptom complex is unclear.  The occurrence at a remote date from chemotherapy suggest her symptoms are not related to acute toxicity from chemotherapy.  She does not appear dehydrated.  It is possible she has developed adrenal insufficiency or autonomic neuropathy.  However her presentation is atypical for these.  Transient bacteremia could explain her symptoms, but no source for infection has been identified.  I have a low clinical suspicion for a Port-A-Cath infection.  Repeat blood cultures have been negative and the Port-A-Cath does not appear infected.  She is afebrile.  I discussed the overall prognosis with Heather Frederick and her husband.  She understands no therapy will be curative and the average patient with metastatic pancreas cancer lives months compared to years.  We hope to complete a few more cycles of chemotherapy prior to a restaging CT.  Recommendations: 1.   Check orthostatic vital signs 2.  Cortisol level 3.  Trial of Florinef or midodrine if orthostasis is confirmed 4.   Follow-up as scheduled at the Cancer center 04/24/2017, the next cycle of FOLFIRINOX will most likely be delayed    LOS: 0 days   Betsy Coder, MD   04/20/2017, 3:00 PM

## 2017-04-20 NOTE — Telephone Encounter (Signed)
Informed patient of pressure change and verbalized understanding was indicated. Patient understands her pressure will change to Set CPAP at 12cm H2O and get a download in 4 weeks. Patient was grateful for the call and thanked me.

## 2017-04-20 NOTE — Consult Note (Signed)
PULMONARY / CRITICAL CARE MEDICINE   Name: Heather Frederick MRN: [id number] DOB: 1956/03/02    ADMISSION DATE:  04/19/2017 CONSULTATION DATE: 04/20/2017  REFERRING MD: Dr. Maudie Mercury  CHIEF COMPLAINT: Shortness of breath  HISTORY OF PRESENT ILLNESS: 62 year old woman with history of metastatic pancreatic cancer with recent admission (1/18-1/23) for SIRS was re-admitted 1/24 for hypotension from her PCP office (SBP reportedly in 80's). Past medical history is significant for HTN, CKD, OSA, and anxiety.  Initial labs significant for hyponatremia 131, hypokalemia 3.4, low albumin of 2.8and hgb/hct of 9 and 28.2 respectively. CXR on 1/18 showed an elevated right hemidiaphragm with mild right basilar atelectasis. She has a history of an occlusive thrombus in her left cephalic vein found on 1/32/4401 but a CTA performed in the ED 1/18 was negative for PE, however, a right upper lobe pulmonary nodule was found (consistent/unchanged with previous finding from 03/11/2017). She has continued complaints of increasing SOB. ECHO from 12/15/2016 indicates EF 60-65% and a repeat ECHO ordered for today. PCCM consulted for further work up.   PAST MEDICAL HISTORY :  She  has a past medical history of Allergy, Anxiety, Arthritis, Benign essential HTN (09/23/2014), Cancer ([place]), Chronic kidney disease, Depression, Dyslipidemia, Elevated liver enzymes, Family history of anesthesia complication, Gallstones, GERD (gastroesophageal reflux disease), Hard of hearing, Hepatic steatosis, History of frequent urinary tract infections, Hyperlipidemia, Hypertension, Lateral epicondylitis of right elbow, Mental disorder, Meralgia paresthetica of right side (10/02/2012), Migraine headache, Obesity, OSA (obstructive sleep apnea), Osteoarthritis, Pancreatic cancer metastasized to liver [facility]), Pneumonia, Raynaud disease, Sepsis ([place]) (02/23/2017), Sleep apnea, and Urinary tract infection.  PAST SURGICAL HISTORY: She  has a past surgical history  that includes Joint replacement; Knee arthroscopy (Left, 12/12); carpel tunnel rel (Right, 4/12); Rotator cuff repair (Left, 6/11); Plantar fascia release (Right, 12/10); Bladder suspension (6/10); carpel tunnel left/right  7/08, 8/08 (Bilateral, 8/08 and 7/08); Knee arthroscopy (Right, 12/06); tennis elbow release (Right, 7/04); Abdominal hysterectomy (1/04); Total knee arthroplasty (Left, 09/10/2012); left heart catheterization with coronary angiogram (N/A, 03/10/2014); Cardiac catheterization; Colonoscopy; radial tunnel release; Cholecystectomy (N/A, 06/11/2014); ERCP (N/A, 10/19/2014); Incisional hernia repair (N/A, 06/30/2016); Insertion of mesh (N/A, 06/30/2016); IR US Guide Vasc Access Right (08/28/2016); IR FLUORO GUIDE PORT INSERTION RIGHT (08/28/2016); and IR CV Line Injection (11/24/2016).  Allergies  Allergen Reactions  . Topamax [Topiramate] Other (See Comments)    Stroke like symptoms  . Aleve [Naproxen Sodium] Hives    Has tolerated Voltaren topical as well as aspirin.  . Bee Venom Swelling  . Echinacea Hives  . Other Other (See Comments)    Feathers cause sinus congestion  . Sulfa Antibiotics Hives  . Advil [Ibuprofen] Hives    Has tolerated Voltaren topical as well as aspirin.    Current Facility-Administered Medications on File Prior to Encounter  Medication  . 0.9 %  sodium chloride infusion  . sodium chloride flush (NS) 0.9 % injection 10 mL  . sodium chloride flush (NS) 0.9 % injection 10 mL  . sodium chloride flush (NS) 0.9 % injection 10 mL   Current Outpatient Medications on File Prior to Encounter  Medication Sig  . aspirin EC 81 MG tablet Take 81 mg by mouth daily.  . citalopram (CELEXA) 20 MG tablet TAKE ONE (1) TABLET BY MOUTH EVERY DAY  . Cranberry-Vitamin C-Vitamin E (CRANBERRY PLUS VITAMIN C PO) Take 1 tablet by mouth daily. 4200 mg  . cyclobenzaprine (FLEXERIL) 5 MG tablet TAKE ONE (1) TABLET BY MOUTH 3 TIMES DAILY AS NEEDED  FOR MUSCLE SPASMS  .  HYDROcodone-acetaminophen (NORCO/VICODIN) 5-325 MG tablet Take 1 tablet by mouth every 6 (six) hours as needed for moderate pain.  Marland Kitchen HYDROmorphone (DILAUDID) 4 MG tablet Take 0.5 tablets (2 mg total) by mouth every 4 (four) hours as needed for severe pain. (Patient taking differently: Take 4-8 mg by mouth every 4 (four) hours as needed for severe pain. )  . insulin detemir (LEVEMIR) 100 unit/ml SOLN Inject 10 Units into the skin at bedtime.  . insulin lispro (HUMALOG) 100 UNIT/ML injection Inject 2-10 Units into the skin. Sliding Scale  . LORazepam (ATIVAN) 1 MG tablet Take 1 tablet (1 mg total) by mouth every 8 (eight) hours as needed for anxiety or sedation (or nausea).  Marland Kitchen LYRICA 200 MG capsule Take 200 mg by mouth daily.  Marland Kitchen morphine (MS CONTIN) 30 MG 12 hr tablet Take 30 mg by mouth every 12 (twelve) hours.  . multivitamin-lutein (OCUVITE-LUTEIN) CAPS capsule Take 1 capsule by mouth daily.  . ondansetron (ZOFRAN-ODT) 8 MG disintegrating tablet Take 1 tablet (8 mg total) by mouth every 8 (eight) hours as needed for nausea or vomiting.  Marland Kitchen oxybutynin (DITROPAN-XL) 10 MG 24 hr tablet Take 1 tablet (10 mg total) by mouth every morning.  . pantoprazole (PROTONIX) 40 MG tablet TAKE ONE (1) TABLET BY MOUTH TWO (2) TIMES DAILY  . vitamin B-12 (CYANOCOBALAMIN) 1000 MCG tablet Take 2,500 mcg by mouth every morning.  Alveda Reasons 20 MG TABS tablet Take 20 mg daily by mouth.  . eletriptan (RELPAX) 40 MG tablet Take 1 tablet (40 mg total) by mouth every 2 (two) hours as needed for migraine.  . hydrOXYzine (ATARAX/VISTARIL) 10 MG tablet Take 10 mg by mouth every 8 (eight) hours as needed for itching.  . lidocaine-prilocaine (EMLA) cream APPLY 1 APPLICATION TOPICALLY AS NEEDED.APPLY TO PORT A CATH SITE 1 HOUR PRIOR TO NEEDLE STICK.  . nitroGLYCERIN (NITROSTAT) 0.4 MG SL tablet Place 1 tablet (0.4 mg total) under the tongue every 5 (five) minutes as needed for chest pain.  Marland Kitchen oxyCODONE (OXY IR/ROXICODONE) 5 MG  immediate release tablet TAKE 1 TABLET BY MOUTH EVERY 4 HOURS AS NEEDED FOR SEVERE PAIN  . prochlorperazine (COMPAZINE) 10 MG tablet Take 10 mg by mouth every 6 (six) hours as needed for nausea or vomiting.  . simethicone (MYLICON) 409 MG chewable tablet Chew 125-250 mg by mouth 2 (two) times daily as needed for flatulence.    FAMILY HISTORY:  Her indicated that her mother is deceased. She indicated that her father is deceased. She indicated that only one of her two brothers is alive. She indicated that her maternal grandmother is deceased. She indicated that her maternal grandfather is deceased. She indicated that her paternal grandmother is deceased. She indicated that her paternal grandfather is deceased. She indicated that her daughter is alive. She indicated that the status of her cousin is unknown. She indicated that the status of her neg hx is unknown. She indicated that the status of her other is unknown.   SOCIAL HISTORY: She  reports that  has never smoked. she has never used smokeless tobacco. She reports that she drinks alcohol. She reports that she does not use drugs.  REVIEW OF SYSTEMS:   Constitutional: postivie for anorexia and sweats , wt loss Eyes: negative for irritation, redness and visual disturbance  Ears, nose, mouth, throat, and face: negative for earaches, epistaxis, nasal congestion and sore throat  Respiratory: negative for cough, sputum and wheezing  Cardiovascular: negative  for chest pain,  lower extremity edema, orthopnea, palpitations and syncope  Gastrointestinal: negative for abdominal pain, constipation, diarrhea, melena, nausea and vomiting  Genitourinary:negative for dysuria, frequency and hematuria  Hematologic/lymphatic: negative for bleeding, easy bruising and lymphadenopathy  Musculoskeletal:negative for arthralgias, muscle weakness and stiff joints  Neurological: negative for coordination problems, gait problems, headaches and weakness  Endocrine:  negative for diabetic symptoms including polydipsia, polyuria and weight loss   VITAL SIGNS: BP (!) 85/37 (BP Location: Left Arm) Comment: RN notified  Pulse 82   Temp 97.9 F (36.6 C) (Oral)   Resp 20   Ht 5\' 3"  (1.6 m)   Wt 238 lb (108 kg)   SpO2 92%   BMI 42.16 kg/m   HEMODYNAMICS:    VENTILATOR SETTINGS:    INTAKE / OUTPUT: I/O last 3 completed shifts: In: 1070 [I.V.:1070] Out: 400 [Urine:400]  PHYSICAL EXAMINATION: Gen. Pleasant, obese, in no distress, normal affect ENT - no lesions, no post nasal drip, class 2 airway Neck: No JVD, no thyromegaly, no carotid bruits Lungs: no use of accessory muscles, no dullness to percussion, decreased without rales or rhonchi  Cardiovascular: Rhythm regular, heart sounds  normal, no murmurs or gallops, no peripheral edema Abdomen: soft and non-tender, no hepatosplenomegaly, BS normal. Musculoskeletal: No deformities, no cyanosis or clubbing Neuro:  alert, non focal, no tremors   LABS:  BMET Recent Labs  Lab 04/16/17 0333 04/17/17 0420 04/18/17 0952 04/19/17 1858  NA 135 134*  --  131*  K 3.2* 4.0  --  3.4*  CL 103 102  --  97*  CO2 25 28  --  24  BUN 5* <5*  --  8  CREATININE 0.59 0.54 0.59 0.65  GLUCOSE 144* 130*  --  166*    Electrolytes Recent Labs  Lab 04/13/17 1219  04/16/17 0333 04/17/17 0420 04/19/17 1858  CALCIUM 9.6   < > 8.3* 8.6* 8.8*  MG 1.6  --   --   --   --    < > = values in this interval not displayed.    CBC Recent Labs  Lab 04/15/17 1445 04/17/17 0420 04/19/17 1858  WBC 14.8* 12.7* 9.5  HGB 7.7* 8.1* 9.0*  HCT 24.4* 25.7* 28.2*  PLT 115* 108* 114*    Coag's No results for input(s): APTT, INR in the last 168 hours.  Sepsis Markers Recent Labs  Lab 04/13/17 1426 04/13/17 1603  LATICACIDVEN 2.60* 0.89    ABG No results for input(s): PHART, PCO2ART, PO2ART in the last 168 hours.  Liver Enzymes Recent Labs  Lab 04/13/17 1219 04/14/17 0439 04/19/17 1858  AST 27 22  23   ALT 29 23 19   ALKPHOS 220* 166* 173*  BILITOT 1.4* 0.8 0.4  ALBUMIN 2.8* 2.6* 2.8*    Cardiac Enzymes Recent Labs  Lab 04/19/17 1858 04/20/17 0002 04/20/17 0652  TROPONINI <0.03 <0.03 <0.03    Glucose Recent Labs  Lab 04/13/17 1519 04/13/17 1557 04/14/17 1510 04/14/17 2139 04/20/17 0803  GLUCAP 138* 131* 312* 199* 140*    Imaging Dg Chest 2 View  Result Date: 04/19/2017 CLINICAL DATA:  Hypotension and history of pancreatic carcinoma EXAM: CHEST  2 VIEW COMPARISON:  04/13/2017 FINDINGS: Right-sided chest wall port is again seen. Elevation the right hemidiaphragm is again noted. The lungs are well aerated bilaterally. Minimal right basilar atelectasis is again seen. No new focal abnormality is noted. IMPRESSION: Stable right basilar atelectasis. Electronically Signed   By: Inez Catalina M.D.   On: 04/19/2017  20:49     STUDIES:  CXR 1/18 >> Elevated right hemidiaphragm with mild right basilar subsegmental atelectasis CTA 1/18 >> Negative for PE, right upper lobe pulmonary nodule unchanged since 03/11/2017, interval decrease in size of pancreatic mass, stable bilobed hepatic metastasis  CULTURES: Blood 1/24 >> Urine 1/25 >>  ANTIBIOTICS: Vancomycin 1/25 >> Zosyn 1/25 >>  SIGNIFICANT EVENTS: 1/18 Admit for SIRS 1/23 Discharge home 1/24 Admit for hypotension  DISCUSSION: Episodes of hypotension, diaphoresis, shortness of breath-seem to be related to chemotherapy administration?  Side effects of irinotecan  -Her lung function was normal in 2015 There is mild elevation of the right hemidiaphragm -She has OSA and is compliant with CPAP -Subcentimeter pulmonary nodules in this never smoker will need serial follow-up  Recommend -Discussed with oncology -Suggest check a.m. cortisol for completion -If hypotension indeed persistent, can consider low-dose midodrine Discussed with patient and husband at bedside  PCCM available as needed  Kara Mead MD.  FCCP. [facility] Pager [phone] If no response call [phone]   04/20/2017

## 2017-04-20 NOTE — Progress Notes (Signed)
PROGRESS NOTE  Heather Frederick NFA:[id number] DOB: Aug 22, 1955 DOA: 04/19/2017 PCP: [name], MD  HPI/Recap of past 24 hours:  Heather Frederick is a 62 y.o. year old female with medical history significant for Metastatic pancreatic cancer with liver metastasis (treated by Dr. Betsy Coder), DVT on xarelto, HTN, Depression, GERD, HLD, Obesity, OSA on CPAP, who was recently admitted from 1/18-1/23 SIRS and hypotension empirically treated with vancomycin and zosyn. One blood culture grew MRSA, while blood culture from port remaine negative. Given no source obtained and no true bacteremia team discussed with patient's oncologist at the time to discontinue antibiotics and she would have repeat blood cultures as an outpatient?. She presented on 04/19/2017 from her PCP's office for complaints of anxiety and was found to have a SBP of 80. She was directly admitted for concerns of sepsis.     This morning, patient denies any cough, diarrhea, abdominal pain, dysuria.  Has not had any fevers or chills this morning.  Assessment/Plan: Active Problems:   Hypotension   Anemia   Thrombocytopenia (HCC)   Leukocytosis   Hypotension, unclear etiology. Recently admitted for SIRS criteria with concern for infection; however, has remained afebrile, no leukocytosis, unremarkable chest x-ray and UA. Patient's cortisol and TSH levels wnl, troponin negative.  ESR elevated but unclear the relevance.  Low blood pressure could be potentially related to poor p.o. Intake with progressive cancer.  -Will continue IV fluids and encourage p.o. intake.  -Follow blood cultures,  -DC empiric vanc, zosyn and let patient declare potential fever if this is due to infection.  -Patient reports symptoms occur within hours after completing chemotherapy. I spoke with patient's oncologist, who reports these recurrent episodes denied follow timeline of recent chemotherapy infusions.  Additionally he agrees that this is less likely infection,  but unclear what the etiology may be.  Tachycardia, intermittent, resolved Cardiology consulted.  No tachycardia currently.  Normal echo in September 2018 So no need for repeat echo on this admission.  Prior Holter monitoring in October 2018 showed PACs and PVCs but no other arrhythmias  Dyspnea on exertion, stable Pulmonology consulted.  Not requiring supplemental oxygen, no documented hypoxia, chest x-ray without abnormalities, CTA chest from most recent hospitalization for similar symptoms negative for PE but did show suspicious right upper lobe pulmonary nodule. may be related to anxiety we will continue supportive care with incentive spirometry and continue to monitor.   Hyponatremia, suspect related to hypovolemia. Sodium 131 (down from 134 during previous admission)Trial IVF and continue to monitor on BMP  Chronic Normocytic Anemia. Currently hgb at baseline. Continue to monitor on CBC.   History of DVT(left cephalic vein) Continue xarelto. Monitor cbc  Metastatic Pancreatic carcinoma metastatic to liver  Continue home pain regimen of MS contin 30 mg BID, norco eery 6 Hrs PRN  Anxiety and Depression. Home Celexa  Jerrye Bushy. Protonix  Code Status: Full Code   Family Communication: No family at bedside Disposition Plan: monitor for fever off antibiotics, watch blood cultures, monitor BP   Consultants:  Cardiology, Pulmonology, Oncology  Procedures:  None   Antimicrobials:  Vancomycin 1/24  Zosyn 1/24  Cultures:  Blood cultures x 2 on 04/19/17  DVT prophylaxis: Treatment dose rivaroxaban   Objective: Vitals:   04/19/17 1807 04/19/17 2033 04/20/17 0118 04/20/17 0511  BP: (!) 110/51 (!) 90/59  (!) 85/37  Pulse: 82 99  82  Resp: (!) _0 Temp: 97.8 F (36.6 C) 99.3 F (37.4 C)  97.9 F (  36.6 C)  TempSrc: Oral Oral  Oral  SpO2: 100% 97%  92%  Weight:   113.9 kg (251 lb) 108 kg (238 lb)  Height: _0  (1.6 m)  _1  (1.6 m)     Intake/Output Summary  (Last 24 hours) at 04/20/2017 0837 Last data filed at 04/20/2017 0600 Gross per 24 hour  Intake 1070 ml  Output 400 ml  Net 670 ml   Filed Weights   04/20/17 0118 04/20/17 0511  Weight: 113.9 kg (251 lb) 108 kg (238 lb)    Exam:  General: sitting up in bed in no distress Eyes: EOMI, anicteric Cardiovascular: regular rate and rhythm, no murmurs, rubs or gallops, no edema Respiratory: Normal respiratory effort, lungs clear to auscultation bilaterally, no rales, wheezes or rhonchi Abdomen: soft, non-distended, non-tender, normal bowel sounds, no guarding or rebound tenderness Skin: No Rash Musculoskeletal:No clubbing / cyanosis. No joint deformity upper and lower extremities.  Neurologic: Grossly no focal neuro deficit.Mental status AAOx3, speech normal, Psychiatric:Appropriate affect, and mood  Data Reviewed: CBC: Recent Labs  Lab 04/13/17 1219 04/14/17 0439 04/15/17 1445 04/17/17 0420 04/19/17 1858  WBC 29.5* 23.8* 14.8* 12.7* 9.5  NEUTROABS 26.4*  --   --   --   --   HGB  --  8.7* 7.7* 8.1* 9.0*  HCT 31.7* 27.3* 24.4* 25.7* 28.2*  MCV 85.0 86.4 86.5 86.2 85.5  PLT 165 136* 115* 108* 409*   Basic Metabolic Panel: Recent Labs  Lab 04/13/17 1219 04/14/17 0439  04/15/17 1445 04/16/17 0333 04/17/17 0420 04/18/17 0952 04/19/17 1858  NA 133* 133*  --  134* 135 134*  --  131*  K 3.7 3.6  --  3.3* 3.2* 4.0  --  3.4*  CL 98 103  --  103 103 102  --  97*  CO2 21* 23  --  _2 --  24  GLUCOSE 139 146*  --  210* 144* 130*  --  166*  BUN 11 10  --  6 5* <5*  --  8  CREATININE  --  0.61   < > 0.60 0.59 0.54 0.59 0.65  CALCIUM 9.6 8.3*  --  7.8* 8.3* 8.6*  --  8.8*  MG 1.6  --   --   --   --   --   --   --    < > = values in this interval not displayed.   GFR: Estimated Creatinine Clearance: 89.2 mL/min (by C-G formula based on SCr of 0.65 mg/dL). Liver Function Tests: Recent Labs  Lab 04/13/17 1219 04/14/17 0439 04/19/17 1858  AST _3 ALT _4 ALKPHOS 220* 166* 173*  BILITOT 1.4* 0.8 0.4  PROT 6.9 6.0* 6.6  ALBUMIN 2.8* 2.6* 2.8*   Recent Labs  Lab 04/13/17 1419  LIPASE 17   No results for input(s): AMMONIA in the last 168 hours. Coagulation Profile: No results for input(s): INR, PROTIME in the last 168 hours. Cardiac Enzymes: Recent Labs  Lab 04/14/17 0439 04/19/17 1858 04/20/17 0002 04/20/17 0652  TROPONINI <0.03 <0.03 <0.03 <0.03   BNP (last 3 results) No results for input(s): PROBNP in the last 8760 hours. HbA1C: No results for input(s): HGBA1C in the last 72 hours. CBG: Recent Labs  Lab 04/13/17 1519 04/13/17 1557 04/14/17 1510 04/14/17 2139 04/20/17 0803  GLUCAP 138* 131* 312* 199* 140*   Lipid Profile: No results for input(s): CHOL, HDL, LDLCALC, TRIG, CHOLHDL, LDLDIRECT in the  last 72 hours. Thyroid Function Tests: No results for input(s): TSH, T4TOTAL, FREET4, T3FREE, THYROIDAB in the last 72 hours. Anemia Panel: No results for input(s): VITAMINB12, FOLATE, FERRITIN, TIBC, IRON, RETICCTPCT in the last 72 hours. Urine analysis:    Component Value Date/Time   COLORURINE STRAW (A) 04/19/2017 0517   APPEARANCEUR CLEAR 04/19/2017 0517   LABSPEC 1.003 (L) 04/19/2017 0517   LABSPEC 1.005 03/28/2017 1319   PHURINE 6.0 04/19/2017 0517   GLUCOSEU NEGATIVE 04/19/2017 0517   GLUCOSEU Negative 03/28/2017 1319   HGBUR SMALL (A) 04/19/2017 0517   BILIRUBINUR NEGATIVE 04/19/2017 0517   BILIRUBINUR Negative 03/28/2017 1319   KETONESUR NEGATIVE 04/19/2017 0517   PROTEINUR NEGATIVE 04/19/2017 0517   UROBILINOGEN 0.2 03/28/2017 1319   NITRITE NEGATIVE 04/19/2017 0517   LEUKOCYTESUR NEGATIVE 04/19/2017 0517   LEUKOCYTESUR Trace 03/28/2017 1319   Sepsis Labs: _0 (procalcitonin:4,lacticidven:4)  ) Recent Results (from the past 240 hour(s))  Urine culture     Status: Abnormal   Collection Time: 04/13/17  2:00 PM  Result Value Ref Range Status   Specimen Description URINE, RANDOM  Final    Special Requests NONE  Final   Culture MULTIPLE SPECIES PRESENT, SUGGEST RECOLLECTION (A)  Final   Report Status 04/15/2017 FINAL  Final  Blood culture (routine x 2)     Status: None   Collection Time: 04/13/17  9:40 PM  Result Value Ref Range Status   Specimen Description BLOOD LEFT ANTECUBITAL  Final   Special Requests   Final    BOTTLES DRAWN AEROBIC AND ANAEROBIC Blood Culture adequate volume   Culture   Final    NO GROWTH 5 DAYS Performed at [facility], [place] [address], [place], [place] [address]    Report Status 04/19/2017 FINAL  Final  Blood culture (routine x 2)     Status: Abnormal   Collection Time: 04/13/17  9:43 PM  Result Value Ref Range Status   Specimen Description BLOOD PORTA CATH  Final   Special Requests   Final    BOTTLES DRAWN AEROBIC AND ANAEROBIC Blood Culture adequate volume   Culture  Setup Time   Final    GRAM POSITIVE COCCI IN BOTH AEROBIC AND ANAEROBIC BOTTLES to JGrismsley(PharmD) by [facility] 04/14/17 AT 10:55PM    Culture (A)  Final    STAPHYLOCOCCUS SPECIES (COAGULASE NEGATIVE) THE SIGNIFICANCE OF ISOLATING THIS ORGANISM FROM A SINGLE SET OF BLOOD CULTURES WHEN MULTIPLE SETS ARE DRAWN IS UNCERTAIN. PLEASE NOTIFY THE MICROBIOLOGY DEPARTMENT WITHIN ONE WEEK IF SPECIATION AND SENSITIVITIES ARE REQUIRED. Performed at [facility], [place] [address] [place], [place] [address]    Report Status 04/16/2017 FINAL  Final  Blood Culture ID Panel (Reflexed)     Status: Abnormal   Collection Time: 04/13/17  9:43 PM  Result Value Ref Range Status   Enterococcus species NOT DETECTED NOT DETECTED Final   Listeria monocytogenes NOT DETECTED NOT DETECTED Final   Staphylococcus species DETECTED (A) NOT DETECTED Final    Comment: Methicillin (oxacillin) resistant coagulase negative staphylococcus. Possible blood culture contaminant (unless isolated from more than one blood culture draw or clinical case suggests pathogenicity). No antibiotic treatment is  indicated for blood  culture contaminants. CRITICAL RESULT CALLED TO, READ BACK BY AND VERIFIED WITH: TO JGRISMELY(PharmD) by [email or web address] 04/14/17 at 10:55am    Staphylococcus aureus NOT DETECTED NOT DETECTED Final   Methicillin resistance DETECTED (A) NOT DETECTED Final    Comment: CRITICAL RESULT CALLED TO, READ BACK BY AND VERIFIED WITH: TO  JGRISMELY(PharmD) by [email or web address] 04/14/17 at 10:55am    Streptococcus species NOT DETECTED NOT DETECTED Final   Streptococcus agalactiae NOT DETECTED NOT DETECTED Final   Streptococcus pneumoniae NOT DETECTED NOT DETECTED Final   Streptococcus pyogenes NOT DETECTED NOT DETECTED Final   Acinetobacter baumannii NOT DETECTED NOT DETECTED Final   Enterobacteriaceae species NOT DETECTED NOT DETECTED Final   Enterobacter cloacae complex NOT DETECTED NOT DETECTED Final   Escherichia coli NOT DETECTED NOT DETECTED Final   Klebsiella oxytoca NOT DETECTED NOT DETECTED Final   Klebsiella pneumoniae NOT DETECTED NOT DETECTED Final   Proteus species NOT DETECTED NOT DETECTED Final   Serratia marcescens NOT DETECTED NOT DETECTED Final   Haemophilus influenzae NOT DETECTED NOT DETECTED Final   Neisseria meningitidis NOT DETECTED NOT DETECTED Final   Pseudomonas aeruginosa NOT DETECTED NOT DETECTED Final   Candida albicans NOT DETECTED NOT DETECTED Final   Candida glabrata NOT DETECTED NOT DETECTED Final   Candida krusei NOT DETECTED NOT DETECTED Final   Candida parapsilosis NOT DETECTED NOT DETECTED Final   Candida tropicalis NOT DETECTED NOT DETECTED Final    Comment: Performed at [facility], [place] [address], [place], [place] [address]  Respiratory Panel by PCR     Status: None   Collection Time: 04/14/17  2:30 PM  Result Value Ref Range Status   Adenovirus NOT DETECTED NOT DETECTED Final   Coronavirus 229E NOT DETECTED NOT DETECTED Final   Coronavirus HKU1 NOT DETECTED NOT DETECTED Final   Coronavirus NL63 NOT DETECTED NOT DETECTED Final    Coronavirus OC43 NOT DETECTED NOT DETECTED Final   Metapneumovirus NOT DETECTED NOT DETECTED Final   Rhinovirus / Enterovirus NOT DETECTED NOT DETECTED Final   Influenza A NOT DETECTED NOT DETECTED Final   Influenza B NOT DETECTED NOT DETECTED Final   Parainfluenza Virus 1 NOT DETECTED NOT DETECTED Final   Parainfluenza Virus 2 NOT DETECTED NOT DETECTED Final   Parainfluenza Virus 3 NOT DETECTED NOT DETECTED Final   Parainfluenza Virus 4 NOT DETECTED NOT DETECTED Final   Respiratory Syncytial Virus NOT DETECTED NOT DETECTED Final   Bordetella pertussis NOT DETECTED NOT DETECTED Final   Chlamydophila pneumoniae NOT DETECTED NOT DETECTED Final   Mycoplasma pneumoniae NOT DETECTED NOT DETECTED Final    Comment: Performed at [facility], [place] [address], [place], [place] [address]  Culture, blood (Routine X 2) w Reflex to ID Panel     Status: None (Preliminary result)   Collection Time: 04/17/17 10:44 AM  Result Value Ref Range Status   Specimen Description BLOOD RIGHT ANTECUBITAL  Final   Special Requests   Final    BOTTLES DRAWN AEROBIC AND ANAEROBIC Blood Culture adequate volume   Culture   Final    NO GROWTH 2 DAYS Performed at [facility], [place] [address], [place], [place] [address]    Report Status PENDING  Incomplete  Culture, blood (Routine X 2) w Reflex to ID Panel     Status: None (Preliminary result)   Collection Time: 04/17/17 10:44 AM  Result Value Ref Range Status   Specimen Description BLOOD LEFT ANTECUBITAL  Final   Special Requests   Final    BOTTLES DRAWN AEROBIC AND ANAEROBIC Blood Culture adequate volume   Culture   Final    NO GROWTH 2 DAYS Performed at [facility], [place] [address] [place], [place] [address]    Report Status PENDING  Incomplete      Studies: Dg  Chest 2 View  Result Date: 04/19/2017 CLINICAL DATA:  Hypotension and history of pancreatic carcinoma EXAM: CHEST  2 VIEW COMPARISON:  04/13/2017 FINDINGS: Right-sided chest wall  port is again seen. Elevation the right hemidiaphragm is again noted. The lungs are well aerated bilaterally. Minimal right basilar atelectasis is again seen. No new focal abnormality is noted. IMPRESSION: Stable right basilar atelectasis. Electronically Signed   By: Inez Catalina M.D.   On: 04/19/2017 20:49    Scheduled Meds: . aspirin EC  81 mg Oral Daily  . citalopram  20 mg Oral Daily  . morphine  30 mg Oral Q12H  . multivitamin-lutein  1 capsule Oral Daily  . oxybutynin  10 mg Oral q morning - 10a  . pantoprazole  40 mg Oral BID  . pregabalin  200 mg Oral Daily  . rivaroxaban  20 mg Oral Q supper    Continuous Infusions:   LOS: 0 days     Desiree Hane, MD [facility] Pager [phone]  If 7PM-7AM, please contact night-coverage [email or web address] Password [facility] 04/20/2017, 8:37 AM

## 2017-04-20 NOTE — Progress Notes (Addendum)
Patient ID: Heather Frederick, female   DOB: [birth year], 62 y.o.   MRN: [id number]                                                                PROGRESS NOTE                                                                                                                                                                                                             Patient Demographics:    Heather Frederick, is a 62 y.o. female, DOB - 10-02-1955, KVQ:[id number]  Admit date - 04/19/2017   Admitting Physician [name], MD  Outpatient Primary MD for the patient is [name], MD  LOS - 0  Outpatient Specialists:    Kavin Leech (oncology) Fransico Him (cardiology) Daneen Schick (cardiology)  No chief complaint on file. hypotension, dyspnea on exertion     Brief Narrative   Heather Frederick  is a 62 y.o. female, w Dm2, Hypertension, Hyperlipidemia, OSA, Gerd, Anemia, metastatic pancreatic cancer, w recent admission for sepsis, just discharged,  apparently presented due to c/o anxiety at PCP office, and was noted to have sbp 80. Pt denies cp, palp, sob, n/v, diarrhea, dysuria, hematuria.   Pt sent for direct admission.       Subjective:    Heather Frederick today has explained that she is having severe dyspnea with exertion, even as little exertion as trying to strain for a BM might trigger this.  She is not sure if it is related to anxiety or another etiology.  Pt denies cp, palp,  N/v, abd pain , diarrhea, brbpr, headaches.  .    Review of records showed  CTA chest 04/13/2017 IMPRESSION: Chest Impression: 1. No evidence acute pulmonary embolism. 2. Suspicious RIGHT upper lobe pulmonary nodule not changed in short interval from 03/11/2017.  PFT 10/08/2013=>  Fev1/Fvc= 84%, fev1 2.48  Cardiac echo 12/15/2016 - Left ventricle: The cavity size was normal. Systolic function was   normal. The estimated ejection fraction was in the range of 60%   to 65%. Wall motion was normal; there were no regional  wall   motion abnormalities. The study is not technically sufficient to   allow evaluation of LV diastolic function. Doppler parameters are   consistent with high ventricular filling pressure. -  Aortic valve: Poorly visualized.  L heart cath 03/10/2014 => Daneen Schick)  ANGIOGRAPHIC DATA:   The left main coronary artery is normal.  The left anterior descending artery is normal. It wraps around the left ventricular apex. It gives origin to a large first diagonal which is also normal..  The left circumflex artery is codominant. Gives origin to a bifurcating second obtuse marginal and the PDA. The vessel is normal..  The right coronary artery is codominant. The vessel is normal..   LEFT VENTRICULOGRAM:  Left ventricular angiogram was done in the 30 RAO projection and revealed normal cavity size with normal contractility. EF 60%.    IMPRESSIONS:  1. Normal coronary arteries 2. Normal systolic function with EF 60%. Elevated end-diastolic pressures compatible with diastolic heart failure.   RECOMMENDATION:  Low-dose diuretic therapy. Titrate beta blocker therapy as tolerated. Chest pain is felt to be noncardiac. No further cardiac evaluation is indicated..    Assessment  & Plan :    Active Problems:   Hypotension   Anemia   Thrombocytopenia (HCC)   Leukocytosis    Dyspnea w exertion, (prior PFT appears normal, hx of elevated end-diastolic pressures, w normal coronaries in 2015 on heart cath) Cardiac echo pending Cardiology consult requested Pulmonary consult requested  Hypotension r/o sepsis, ESR 78 (Wbc 9.5, , urinalysis negative, cortisol wnl, blood culture pending) I think the discrepancy between her ESR and Wbc is probably her malignancy Hold off on Abx for now Repeat CXR Hydrate with ns  Holding most of her pain medication Check cardiac echo   Anemia  Check cbc in am  Anxiety Hold lorazepam Cont celexa  Gerd Cont protonix  Dm2 fsbs ac and  qhs iss Cont levemir  H/o DVT (left cephalic vein) Cont xarelto  Code Status : FULL CODE  Family Communication  :  w husband  Disposition Plan  : home  Barriers For Discharge :   Consults  :  Cardiology, pulmonary  Procedures  : echo pending  DVT Prophylaxis  :  Xarelto  Lab Results  Component Value Date   PLT 114 (L) 04/19/2017    Antibiotics  :    Anti-infectives (From admission, onward)   None        Objective:   Vitals:   04/19/17 1807 04/19/17 2033 04/20/17 0118 04/20/17 0511  BP: (!) 110/51 (!) 90/59  (!) 85/37  Pulse: 82 99  82  Resp: (!) _0 Temp: 97.8 F (36.6 C) 99.3 F (37.4 C)  97.9 F (36.6 C)  TempSrc: Oral Oral  Oral  SpO2: 100% 97%  92%  Weight:   113.9 kg (251 lb) 108 kg (238 lb)  Height: _1  (1.6 m)  _2  (1.6 m)     Wt Readings from Last 3 Encounters:  04/20/17 108 kg (238 lb)  04/14/17 115.6 kg (254 lb 13.6 oz)  04/13/17 114.8 kg (253 lb)     Intake/Output Summary (Last 24 hours) at 04/20/2017 0757 Last data filed at 04/20/2017 0600 Gross per 24 hour  Intake 1070 ml  Output 400 ml  Net 670 ml     Physical Exam  Awake Alert, Oriented X 3, No new F.N deficits, Normal affect [place].AT,PERRAL Supple Neck,No JVD, No cervical lymphadenopathy appriciated.  Symmetrical Chest wall movement, Good air movement bilaterally, CTAB RRR,No Gallops,Rubs or new Murmurs, No Parasternal Heave +ve B.Sounds, Abd Soft, No tenderness, No organomegaly appriciated, No rebound - guarding or rigidity. No Cyanosis, Clubbing or edema, No  new Rash or bruise      Data Review:    CBC Recent Labs  Lab 04/13/17 1219 04/14/17 0439 04/15/17 1445 04/17/17 0420 04/19/17 1858  WBC 29.5* 23.8* 14.8* 12.7* 9.5  HGB  --  8.7* 7.7* 8.1* 9.0*  HCT 31.7* 27.3* 24.4* 25.7* 28.2*  PLT 165 136* 115* 108* 114*  MCV 85.0 86.4 86.5 86.2 85.5  MCH 27.3 27.5 27.3 27.2 27.3  MCHC 32.2 31.9 31.6 31.5 31.9  RDW 18.6* 18.7* 18.8* 19.2* 19.8*  LYMPHSABS  2.1  --   --   --   --   MONOABS 0.6  --   --   --   --   EOSABS 0.5  --   --   --   --   BASOSABS 0.0  --   --   --   --     Chemistries  Recent Labs  Lab 04/13/17 1219 04/14/17 0439  04/15/17 1445 04/16/17 0333 04/17/17 0420 04/18/17 0952 04/19/17 1858  NA 133* 133*  --  134* 135 134*  --  131*  K 3.7 3.6  --  3.3* 3.2* 4.0  --  3.4*  CL 98 103  --  103 103 102  --  97*  CO2 21* 23  --  _0 --  24  GLUCOSE 139 146*  --  210* 144* 130*  --  166*  BUN 11 10  --  6 5* <5*  --  8  CREATININE  --  0.61   < > 0.60 0.59 0.54 0.59 0.65  CALCIUM 9.6 8.3*  --  7.8* 8.3* 8.6*  --  8.8*  MG 1.6  --   --   --   --   --   --   --   AST 27 22  --   --   --   --   --  23  ALT 29 23  --   --   --   --   --  19  ALKPHOS 220* 166*  --   --   --   --   --  173*  BILITOT 1.4* 0.8  --   --   --   --   --  0.4   < > = values in this interval not displayed.   ------------------------------------------------------------------------------------------------------------------ No results for input(s): CHOL, HDL, LDLCALC, TRIG, CHOLHDL, LDLDIRECT in the last 72 hours.  Lab Results  Component Value Date   HGBA1C 7.2 (H) 09/29/2016   ------------------------------------------------------------------------------------------------------------------ No results for input(s): TSH, T4TOTAL, T3FREE, THYROIDAB in the last 72 hours.  Invalid input(s): FREET3 ------------------------------------------------------------------------------------------------------------------ No results for input(s): VITAMINB12, FOLATE, FERRITIN, TIBC, IRON, RETICCTPCT in the last 72 hours.  Coagulation profile No results for input(s): INR, PROTIME in the last 168 hours.  No results for input(s): DDIMER in the last 72 hours.  Cardiac Enzymes Recent Labs  Lab 04/19/17 1858 04/20/17 0002 04/20/17 0652  TROPONINI <0.03 <0.03 <0.03    ------------------------------------------------------------------------------------------------------------------ No results found for: BNP  Inpatient Medications  Scheduled Meds: . aspirin EC  81 mg Oral Daily  . citalopram  20 mg Oral Daily  . morphine  30 mg Oral Q12H  . multivitamin-lutein  1 capsule Oral Daily  . oxybutynin  10 mg Oral q morning - 10a  . pantoprazole  40 mg Oral BID  . pregabalin  200 mg Oral Daily  . rivaroxaban  20 mg Oral Q supper   Continuous Infusions: PRN Meds:.acetaminophen **OR**  acetaminophen, HYDROcodone-acetaminophen, hydrOXYzine, sodium chloride flush  Micro Results Recent Results (from the past 240 hour(s))  Urine culture     Status: Abnormal   Collection Time: 04/13/17  2:00 PM  Result Value Ref Range Status   Specimen Description URINE, RANDOM  Final   Special Requests NONE  Final   Culture MULTIPLE SPECIES PRESENT, SUGGEST RECOLLECTION (A)  Final   Report Status 04/15/2017 FINAL  Final  Blood culture (routine x 2)     Status: None   Collection Time: 04/13/17  9:40 PM  Result Value Ref Range Status   Specimen Description BLOOD LEFT ANTECUBITAL  Final   Special Requests   Final    BOTTLES DRAWN AEROBIC AND ANAEROBIC Blood Culture adequate volume   Culture   Final    NO GROWTH 5 DAYS Performed at [facility], [place] [address], [place], [place] [address]    Report Status 04/19/2017 FINAL  Final  Blood culture (routine x 2)     Status: Abnormal   Collection Time: 04/13/17  9:43 PM  Result Value Ref Range Status   Specimen Description BLOOD PORTA CATH  Final   Special Requests   Final    BOTTLES DRAWN AEROBIC AND ANAEROBIC Blood Culture adequate volume   Culture  Setup Time   Final    GRAM POSITIVE COCCI IN BOTH AEROBIC AND ANAEROBIC BOTTLES to JGrismsley(PharmD) by [facility] 04/14/17 AT 10:55PM    Culture (A)  Final    STAPHYLOCOCCUS SPECIES (COAGULASE NEGATIVE) THE SIGNIFICANCE OF ISOLATING THIS ORGANISM FROM A SINGLE SET  OF BLOOD CULTURES WHEN MULTIPLE SETS ARE DRAWN IS UNCERTAIN. PLEASE NOTIFY THE MICROBIOLOGY DEPARTMENT WITHIN ONE WEEK IF SPECIATION AND SENSITIVITIES ARE REQUIRED. Performed at [facility], [place] [address] [place], [place] [address]    Report Status 04/16/2017 FINAL  Final  Blood Culture ID Panel (Reflexed)     Status: Abnormal   Collection Time: 04/13/17  9:43 PM  Result Value Ref Range Status   Enterococcus species NOT DETECTED NOT DETECTED Final   Listeria monocytogenes NOT DETECTED NOT DETECTED Final   Staphylococcus species DETECTED (A) NOT DETECTED Final    Comment: Methicillin (oxacillin) resistant coagulase negative staphylococcus. Possible blood culture contaminant (unless isolated from more than one blood culture draw or clinical case suggests pathogenicity). No antibiotic treatment is indicated for blood  culture contaminants. CRITICAL RESULT CALLED TO, READ BACK BY AND VERIFIED WITH: TO JGRISMELY(PharmD) by [email or web address] 04/14/17 at 10:55am    Staphylococcus aureus NOT DETECTED NOT DETECTED Final   Methicillin resistance DETECTED (A) NOT DETECTED Final    Comment: CRITICAL RESULT CALLED TO, READ BACK BY AND VERIFIED WITH: TO JGRISMELY(PharmD) by [email or web address] 04/14/17 at 10:55am    Streptococcus species NOT DETECTED NOT DETECTED Final   Streptococcus agalactiae NOT DETECTED NOT DETECTED Final   Streptococcus pneumoniae NOT DETECTED NOT DETECTED Final   Streptococcus pyogenes NOT DETECTED NOT DETECTED Final   Acinetobacter baumannii NOT DETECTED NOT DETECTED Final   Enterobacteriaceae species NOT DETECTED NOT DETECTED Final   Enterobacter cloacae complex NOT DETECTED NOT DETECTED Final   Escherichia coli NOT DETECTED NOT DETECTED Final   Klebsiella oxytoca NOT DETECTED NOT DETECTED Final   Klebsiella pneumoniae NOT DETECTED NOT DETECTED Final   Proteus species NOT DETECTED NOT DETECTED Final   Serratia marcescens NOT DETECTED NOT DETECTED Final   Haemophilus influenzae NOT  DETECTED NOT DETECTED Final   Neisseria meningitidis NOT DETECTED NOT DETECTED Final   Pseudomonas aeruginosa NOT DETECTED NOT DETECTED  Final   Candida albicans NOT DETECTED NOT DETECTED Final   Candida glabrata NOT DETECTED NOT DETECTED Final   Candida krusei NOT DETECTED NOT DETECTED Final   Candida parapsilosis NOT DETECTED NOT DETECTED Final   Candida tropicalis NOT DETECTED NOT DETECTED Final    Comment: Performed at [facility], [place] [address], [place], [place] [address]  Respiratory Panel by PCR     Status: None   Collection Time: 04/14/17  2:30 PM  Result Value Ref Range Status   Adenovirus NOT DETECTED NOT DETECTED Final   Coronavirus 229E NOT DETECTED NOT DETECTED Final   Coronavirus HKU1 NOT DETECTED NOT DETECTED Final   Coronavirus NL63 NOT DETECTED NOT DETECTED Final   Coronavirus OC43 NOT DETECTED NOT DETECTED Final   Metapneumovirus NOT DETECTED NOT DETECTED Final   Rhinovirus / Enterovirus NOT DETECTED NOT DETECTED Final   Influenza A NOT DETECTED NOT DETECTED Final   Influenza B NOT DETECTED NOT DETECTED Final   Parainfluenza Virus 1 NOT DETECTED NOT DETECTED Final   Parainfluenza Virus 2 NOT DETECTED NOT DETECTED Final   Parainfluenza Virus 3 NOT DETECTED NOT DETECTED Final   Parainfluenza Virus 4 NOT DETECTED NOT DETECTED Final   Respiratory Syncytial Virus NOT DETECTED NOT DETECTED Final   Bordetella pertussis NOT DETECTED NOT DETECTED Final   Chlamydophila pneumoniae NOT DETECTED NOT DETECTED Final   Mycoplasma pneumoniae NOT DETECTED NOT DETECTED Final    Comment: Performed at [facility], [place] [address] [place], [place] [address]  Culture, blood (Routine X 2) w Reflex to ID Panel     Status: None (Preliminary result)   Collection Time: 04/17/17 10:44 AM  Result Value Ref Range Status   Specimen Description BLOOD RIGHT ANTECUBITAL  Final   Special Requests   Final    BOTTLES DRAWN AEROBIC AND ANAEROBIC Blood Culture adequate volume   Culture    Final    NO GROWTH 2 DAYS Performed at [facility], [place] [address] [place], [place] [address]    Report Status PENDING  Incomplete  Culture, blood (Routine X 2) w Reflex to ID Panel     Status: None (Preliminary result)   Collection Time: 04/17/17 10:44 AM  Result Value Ref Range Status   Specimen Description BLOOD LEFT ANTECUBITAL  Final   Special Requests   Final    BOTTLES DRAWN AEROBIC AND ANAEROBIC Blood Culture adequate volume   Culture   Final    NO GROWTH 2 DAYS Performed at [facility], [place] [address], [place], [place] [address]    Report Status PENDING  Incomplete    Radiology Reports Dg Chest 2 View  Result Date: 04/19/2017 CLINICAL DATA:  Hypotension and history of pancreatic carcinoma EXAM: CHEST  2 VIEW COMPARISON:  04/13/2017 FINDINGS: Right-sided chest wall port is again seen. Elevation the right hemidiaphragm is again noted. The lungs are well aerated bilaterally. Minimal right basilar atelectasis is again seen. No new focal abnormality is noted. IMPRESSION: Stable right basilar atelectasis. Electronically Signed   By: Inez Catalina M.D.   On: 04/19/2017 20:49   Dg Chest 2 View  Result Date: 04/13/2017 CLINICAL DATA:  Shortness of breath. EXAM: CHEST  2 VIEW COMPARISON:  Radiograph of February 23, 2017. FINDINGS: Stable cardiomediastinal silhouette. No pneumothorax is noted. Elevated right hemidiaphragm is noted with mild right basilar subsegmental atelectasis. Right internal jugular Port-A-Cath is noted with distal tip in expected position of the SVC. Left lung is clear.  No significant pleural effusion is noted. Bony thorax is unremarkable. IMPRESSION: Elevated right hemidiaphragm with mild right basilar subsegmental atelectasis. No other significant abnormality is noted. Electronically Signed   By: Marijo Conception, M.D.   On: 04/13/2017 14:56   Ct Angio Chest Pe W/cm &/or Wo Cm  Result Date: 04/13/2017 CLINICAL DATA:  Pancreatic carcinoma. Referral cancer  center for hypotension and malaise. Chills and nausea and vomiting. Chronic RIGHT-sided abdominal pain. EXAM: CT ANGIOGRAPHY CHEST CT ABDOMEN AND PELVIS WITH CONTRAST TECHNIQUE: Multidetector CT imaging of the chest was performed using the standard protocol during bolus administration of intravenous contrast. Multiplanar CT image reconstructions and MIPs were obtained to evaluate the vascular anatomy. Multidetector CT imaging of the abdomen and pelvis was performed using the standard protocol during bolus administration of intravenous contrast. CONTRAST:  175m ISOVUE-370 IOPAMIDOL (ISOVUE-370) INJECTION 76% COMPARISON:  CT chest abdomen pelvis 03/11/2017 FINDINGS: CTA CHEST FINDINGS Cardiovascular: No filling defects within the pulmonary arteries arteries to suggest acute pulmonary embolism. No significant vascular findings. Normal heart size. No pericardial effusion. Mediastinum/Nodes: No axillary supraclavicular adenopathy. Port in the RIGHT chest wall with tip in distal SVC. No mediastinal hilar adenopathy. No pericardial fluid. Esophagus normal. Lungs/Pleura: 6 mm RIGHT upper lobe pulmonary nodule is not changed compared to 03/11/2017. Lesions increased in size from 12/06/2016 no new pulmonary nodules. Musculoskeletal: No aggressive osseous lesion Review of the MIP images confirms the above findings. CT ABDOMEN and PELVIS FINDINGS Hepatobiliary: Multiple round low-density lesions in liver parenchyma consistent with hepatic metastasis are not changed in short interval from 03/11/2017. No new lesions. Postcholecystectomy. Pancreas: Lesion head of pancreas measures smaller at 3.7 by 4.9 cm compared to 4.5 x 5.2 cm. There is atrophy duct dilatation the body and tail the pancreas. Spleen: Geographic low attenuation within the medial aspect the spleen is likely a profusion phenomena (image 34, series 4). Adrenals/urinary tract: Adrenal glands and kidneys are normal. The ureters and bladder normal. Stomach/Bowel:  Stomach, small bowel, appendix, and cecum are normal. The colon and rectosigmoid colon are normal. Vascular/Lymphatic: Abdominal aorta is normal caliber. There is no retroperitoneal or periportal lymphadenopathy. No pelvic lymphadenopathy. Reproductive: Post hysterectomy. Other: No peritoneal nodularity Musculoskeletal: No aggressive osseous lesion. Review of the MIP images confirms the above findings. IMPRESSION: Chest Impression: 1. No evidence acute pulmonary embolism. 2. Suspicious RIGHT upper lobe pulmonary nodule not changed in short interval from 03/11/2017. Abdomen / Pelvis Impression: 1. Interval decrease in size of pancreatic mass in short interval follow-up. 2. Stable bilobed hepatic metastasis. 3. No peritoneal metastatic disease or progressive adenopathy. Electronically Signed   By: SSuzy BouchardM.D.   On: 04/13/2017 17:55   Ct Abdomen Pelvis W Contrast  Result Date: 04/13/2017 CLINICAL DATA:  Pancreatic carcinoma. Referral cancer center for hypotension and malaise. Chills and nausea and vomiting. Chronic RIGHT-sided abdominal pain. EXAM: CT ANGIOGRAPHY CHEST CT ABDOMEN AND PELVIS WITH CONTRAST TECHNIQUE: Multidetector CT imaging of the chest was performed using the standard protocol during bolus administration of intravenous contrast. Multiplanar CT image reconstructions and MIPs were obtained to evaluate the vascular anatomy. Multidetector CT imaging of the abdomen and pelvis was performed using the standard protocol during bolus administration of intravenous contrast. CONTRAST:  1059mISOVUE-370 IOPAMIDOL (ISOVUE-370) INJECTION 76% COMPARISON:  CT chest abdomen pelvis 03/11/2017 FINDINGS: CTA CHEST FINDINGS Cardiovascular: No filling defects within the pulmonary arteries arteries to suggest acute pulmonary embolism. No significant vascular findings. Normal heart size. No pericardial effusion. Mediastinum/Nodes: No axillary supraclavicular adenopathy. Port in the  RIGHT chest wall with tip in  distal SVC. No mediastinal hilar adenopathy. No pericardial fluid. Esophagus normal. Lungs/Pleura: 6 mm RIGHT upper lobe pulmonary nodule is not changed compared to 03/11/2017. Lesions increased in size from 12/06/2016 no new pulmonary nodules. Musculoskeletal: No aggressive osseous lesion Review of the MIP images confirms the above findings. CT ABDOMEN and PELVIS FINDINGS Hepatobiliary: Multiple round low-density lesions in liver parenchyma consistent with hepatic metastasis are not changed in short interval from 03/11/2017. No new lesions. Postcholecystectomy. Pancreas: Lesion head of pancreas measures smaller at 3.7 by 4.9 cm compared to 4.5 x 5.2 cm. There is atrophy duct dilatation the body and tail the pancreas. Spleen: Geographic low attenuation within the medial aspect the spleen is likely a profusion phenomena (image 34, series 4). Adrenals/urinary tract: Adrenal glands and kidneys are normal. The ureters and bladder normal. Stomach/Bowel: Stomach, small bowel, appendix, and cecum are normal. The colon and rectosigmoid colon are normal. Vascular/Lymphatic: Abdominal aorta is normal caliber. There is no retroperitoneal or periportal lymphadenopathy. No pelvic lymphadenopathy. Reproductive: Post hysterectomy. Other: No peritoneal nodularity Musculoskeletal: No aggressive osseous lesion. Review of the MIP images confirms the above findings. IMPRESSION: Chest Impression: 1. No evidence acute pulmonary embolism. 2. Suspicious RIGHT upper lobe pulmonary nodule not changed in short interval from 03/11/2017. Abdomen / Pelvis Impression: 1. Interval decrease in size of pancreatic mass in short interval follow-up. 2. Stable bilobed hepatic metastasis. 3. No peritoneal metastatic disease or progressive adenopathy. Electronically Signed   By: Suzy Bouchard M.D.   On: 04/13/2017 17:55    Time Spent in minutes  30   [name] M.D on 04/20/2017 at 7:57 AM  Between 7am to 7pm - Pager - [phone]    After 7pm  go to [email or web address] - password [facility]  [facility] -  Office  [phone]

## 2017-04-20 NOTE — Progress Notes (Signed)
Pharmacy Antibiotic Note  Heather Frederick is a 62 y.o. female with metastatic prostate cancer on chemotherapy who was recently admitted admitted 1/18-1/23 for SIRS and treated with vancomycin and zosyn.  Cultures at that time were negative except for 1 of 2 bottles on 1/18 with coag neg staph felt to be a contaminant. Now readmitted on 04/19/2017 with possible  sepsis.  Pharmacy has been consulted for vancomycin and zosyn dosing.  Plan: Zosyn 3.375gm IV q8h (4hr extended infusions) Vancomycin 2g IV x 1, then 1500mg  IV q24h (estimated AUC 452) Check vancomycin peak and trough at steady state to calculate AUC (goal 40-500) Follow up renal function (daily SCr) & cultures  Height: 5\' 3"  (160 cm) Weight: 238 lb (108 kg) IBW/kg (Calculated) : 52.4  Temp (24hrs), Avg:98.3 F (36.8 C), Min:97.8 F (36.6 C), Max:99.3 F (37.4 C)  Recent Labs  Lab 04/13/17 1219 04/13/17 1426 04/13/17 1603 04/14/17 0439  04/15/17 1445 04/16/17 0333 04/17/17 0420 04/18/17 0952 04/19/17 1858  WBC 29.5*  --   --  23.8*  --  14.8*  --  12.7*  --  9.5  CREATININE  --   --   --  0.61   < > 0.60 0.59 0.54 0.59 0.65  LATICACIDVEN  --  2.60* 0.89  --   --   --   --   --   --   --    < > = values in this interval not displayed.    Estimated Creatinine Clearance: 89.2 mL/min (by C-G formula based on SCr of 0.65 mg/dL).    Allergies  Allergen Reactions  . Topamax [Topiramate] Other (See Comments)    Stroke like symptoms  . Aleve [Naproxen Sodium] Hives    Has tolerated Voltaren topical as well as aspirin.  . Bee Venom Swelling  . Echinacea Hives  . Other Other (See Comments)    Feathers cause sinus congestion  . Sulfa Antibiotics Hives  . Advil [Ibuprofen] Hives    Has tolerated Voltaren topical as well as aspirin.    Antimicrobials this admission:  1/25 Vanc >> 1/16 Zosyn >>  Dose adjustments this admission:    Microbiology results:  1/25 BCx: 1/25 UCx:  Previous admission cultures: 1/18BCx  x 2: 1/2 CoNS, MecA+, MD suspects contaminant 1/18UCx:multi species, suggest recollection 1/19 resp panel: negative 1/22 BCx x 2 (from Port-A-Cath): NGTD  Thank you for allowing pharmacy to be a part of this patient's care.  Peggyann Juba, PharmD, BCPS Pager: [phone] 04/20/2017 10:56 AM

## 2017-04-20 NOTE — Telephone Encounter (Signed)
-----   Message from Sueanne Margarita, MD sent at 04/18/2017  9:17 PM EST ----- Set CPAP at 12cm H2O and get a download in 4 weeks

## 2017-04-20 NOTE — Consult Note (Signed)
Cardiology Consultation:   Patient ID: Heather Frederick; [id number]; 12-04-1955   Admit date: 04/19/2017 Date of Consult: 04/20/2017  Primary Care Provider: [name], Frederick Primary Cardiologist: Heather Frederick Primary Electrophysiologist:  n/a   Patient Profile:   Heather Frederick is a 62 y.o. female with a hx of metastatic pancreatic cancer currently undergoing chemotherapy, HTN, HLD, T2DM, OSA on CPAP, Anemia, GERD, who is being seen today for the evaluation of chest pain and palpitations at the request of Heather Frederick.  History of Present Illness:   Heather Frederick has been having almost daily episodes where she gets short of breath then notices her heart racing followed by cold sweats and diaphoresis. During these episodes she will get dizzy and extremely fatigued. Sometimes these episodes resolve on their own after she lies in bed and falls asleep.   However, after chemo infusions, she will get more severe episodes where her BP drops and the symptoms progress to the point where she has to be taken to the ED. She has had multiple hospital admissions for this.   She started having these episodes when she started a more aggressive chemo around September to October 2018. She has had severe episodes that require her to be hospitalized after every chemo cycle except one.   She had some chest tightness when she presented to the ED on 1/18, but has no other history of chest pain. Chronic DOE, no recent change. +orthopnea, no PND.   Pt drinks 3 qts water daily plus other liquids. Appetite is not great, but she is eating pretty well. Has lost about 60 lbs since starting chemo.   Past Medical History:  Diagnosis Date  . Allergy   . Anxiety   . Arthritis   . Benign essential HTN 09/23/2014  . Cancer ([place])   . Chronic kidney disease    uti  . Depression   . Dyslipidemia   . Elevated liver enzymes   . Family history of anesthesia complication    father has a severe hard time waking up  . Gallstones    a.  Seen on CT 01/2014.  Marland Kitchen GERD (gastroesophageal reflux disease)   . Hard of hearing   . Hepatic steatosis   . History of frequent urinary tract infections   . Hyperlipidemia   . Hypertension   . Lateral epicondylitis of right elbow   . Mental disorder   . Meralgia paresthetica of right side 10/02/2012   slight at 05/2014  . Migraine headache   . Obesity   . OSA (obstructive sleep apnea)    severe with AHI 37/hr now on CPAP at 18cm H2O  . Osteoarthritis   . Pancreatic cancer metastasized to liver ([place])   . Pneumonia    Feb 2018  . Raynaud disease    in feet per patient   . Sepsis ([place]) 02/23/2017  . Sleep apnea    wears c-pap  . Urinary tract infection     Past Surgical History:  Procedure Laterality Date  . ABDOMINAL HYSTERECTOMY  1/04   partial  . BLADDER SUSPENSION  6/10  . CARDIAC CATHETERIZATION    . carpel tunnel left/right  7/08, 8/08 Bilateral 8/08 and 7/08  . carpel tunnel rel Right 4/12  . CHOLECYSTECTOMY N/A 06/11/2014   Procedure: LAPAROSCOPIC CHOLECYSTECTOMY WITH ATTEMPTED INTRAOPERATIVE CHOLANGIOGRAM;  Surgeon: Jackolyn Confer, Frederick;  Location: [facility];  Service: General;  Laterality: N/A;  . COLONOSCOPY    . ERCP N/A 10/19/2014   Procedure: ENDOSCOPIC RETROGRADE CHOLANGIOPANCREATOGRAPHY (  ERCP);  Surgeon: Ladene Artist, Frederick;  Location: [facility];  Service: Endoscopy;  Laterality: N/A;  . INCISIONAL HERNIA REPAIR N/A 06/30/2016   Procedure: LAPAROSCOPIC REPAIR OF INCISIONAL HERNIA WITH MESH;  Surgeon: Jackolyn Confer, Frederick;  Location: [facility];  Service: General;  Laterality: N/A;  . INSERTION OF MESH N/A 06/30/2016   Procedure: INSERTION OF MESH;  Surgeon: Jackolyn Confer, Frederick;  Location: [facility];  Service: General;  Laterality: N/A;  . IR CV LINE INJECTION  11/24/2016  . IR FLUORO GUIDE PORT INSERTION RIGHT  08/28/2016  . IR US GUIDE VASC ACCESS RIGHT  08/28/2016  . JOINT REPLACEMENT    . KNEE ARTHROSCOPY Left 12/12  . KNEE ARTHROSCOPY Right 12/06  . LEFT HEART  CATHETERIZATION WITH CORONARY ANGIOGRAM N/A 03/10/2014   Procedure: LEFT HEART CATHETERIZATION WITH CORONARY ANGIOGRAM;  Surgeon: Sinclair Grooms, Frederick;  Location: [facility] C[facility] LAB;  Service: Cardiovascular;  Laterality: N/A;  . PLANTAR FASCIA RELEASE Right 12/10  . radial tunnel release     right arm   . ROTATOR CUFF REPAIR Left 6/11  . tennis elbow release Right 7/04  . TOTAL KNEE ARTHROPLASTY Left 09/10/2012   Procedure: TOTAL KNEE ARTHROPLASTY- left;  Surgeon: Garald Balding, Frederick;  Location: [place];  Service: Orthopedics;  Laterality: Left;  Left total knee arthroplasty     Prior to Admission medications   Medication Sig Start Date End Date Taking? Authorizing Provider  aspirin EC 81 MG tablet Take 81 mg by mouth daily.   Yes Provider, Historical, Frederick  citalopram (CELEXA) 20 MG tablet TAKE ONE (1) TABLET BY MOUTH EVERY DAY 01/22/17  Yes Hoyt Koch, Frederick  Cranberry-Vitamin C-Vitamin E (CRANBERRY PLUS VITAMIN C PO) Take 1 tablet by mouth daily. 4200 mg   Yes Provider, Historical, Frederick  cyclobenzaprine (FLEXERIL) 5 MG tablet TAKE ONE (1) TABLET BY MOUTH 3 TIMES DAILY AS NEEDED FOR MUSCLE SPASMS 04/19/17  Yes Ladell Pier, Frederick  HYDROcodone-acetaminophen (NORCO/VICODIN) 5-325 MG tablet Take 1 tablet by mouth every 6 (six) hours as needed for moderate pain.   Yes Provider, Historical, Frederick  HYDROmorphone (DILAUDID) 4 MG tablet Take 0.5 tablets (2 mg total) by mouth every 4 (four) hours as needed for severe pain. Patient taking differently: Take 4-8 mg by mouth every 4 (four) hours as needed for severe pain.  03/14/17  Yes Charlynne Cousins, Frederick  insulin detemir (LEVEMIR) 100 unit/ml SOLN Inject 10 Units into the skin at bedtime.   Yes Provider, Historical, Frederick  insulin lispro (HUMALOG) 100 UNIT/ML injection Inject 2-10 Units into the skin. Sliding Scale   Yes Provider, Historical, Frederick  LORazepam (ATIVAN) 1 MG tablet Take 1 tablet (1 mg total) by mouth every 8 (eight) hours as needed for anxiety  or sedation (or nausea). 04/18/17  Yes Lama, Marge Duncans, Frederick  LYRICA 200 MG capsule Take 200 mg by mouth daily. 04/03/17  Yes Provider, Historical, Frederick  morphine (MS CONTIN) 30 MG 12 hr tablet Take 30 mg by mouth every 12 (twelve) hours. 03/20/17  Yes Provider, Historical, Frederick  multivitamin-lutein (OCUVITE-LUTEIN) CAPS capsule Take 1 capsule by mouth daily.   Yes Provider, Historical, Frederick  ondansetron (ZOFRAN-ODT) 8 MG disintegrating tablet Take 1 tablet (8 mg total) by mouth every 8 (eight) hours as needed for nausea or vomiting. 09/18/16  Yes Curcio, Roselie Awkward, NP  oxybutynin (DITROPAN-XL) 10 MG 24 hr tablet Take 1 tablet (10 mg total) by mouth every morning. 03/19/17  Yes Betsy Coder  B, Frederick  pantoprazole (PROTONIX) 40 MG tablet TAKE ONE (1) TABLET BY MOUTH TWO (2) TIMES DAILY 09/25/16  Yes Ladene Artist, Frederick  vitamin B-12 (CYANOCOBALAMIN) 1000 MCG tablet Take 2,500 mcg by mouth every morning.   Yes Provider, Historical, Frederick  XARELTO 20 MG TABS tablet Take 20 mg daily by mouth. 01/31/17  Yes Provider, Historical, Frederick  eletriptan (RELPAX) 40 MG tablet Take 1 tablet (40 mg total) by mouth every 2 (two) hours as needed for migraine. 06/26/16   Dennie Bible, NP  hydrOXYzine (ATARAX/VISTARIL) 10 MG tablet Take 10 mg by mouth every 8 (eight) hours as needed for itching.    Provider, Historical, Frederick  lidocaine-prilocaine (EMLA) cream APPLY 1 APPLICATION TOPICALLY AS NEEDED.APPLY TO PORT A CATH SITE 1 HOUR PRIOR TO NEEDLE STICK. 03/26/17   Ladell Pier, Frederick  nitroGLYCERIN (NITROSTAT) 0.4 MG SL tablet Place 1 tablet (0.4 mg total) under the tongue every 5 (five) minutes as needed for chest pain. 06/28/15   Belva Crome, Frederick  oxyCODONE (OXY IR/ROXICODONE) 5 MG immediate release tablet TAKE 1 TABLET BY MOUTH EVERY 4 HOURS AS NEEDED FOR SEVERE PAIN 04/03/17   Owens Shark, NP  prochlorperazine (COMPAZINE) 10 MG tablet Take 10 mg by mouth every 6 (six) hours as needed for nausea or vomiting.    Provider, Historical,  Frederick  simethicone (MYLICON) 240 MG chewable tablet Chew 125-250 mg by mouth 2 (two) times daily as needed for flatulence.    Provider, Historical, Frederick    Inpatient Medications: Scheduled Meds: . aspirin EC  81 mg Oral Daily  . citalopram  20 mg Oral Daily  . morphine  30 mg Oral Q12H  . multivitamin-lutein  1 capsule Oral Daily  . oxybutynin  10 mg Oral q morning - 10a  . pantoprazole  40 mg Oral BID  . pregabalin  200 mg Oral Daily  . rivaroxaban  20 mg Oral Q supper   Continuous Infusions: . [START ON 04/21/2017] vancomycin     PRN Meds: acetaminophen **OR** acetaminophen, HYDROcodone-acetaminophen, hydrOXYzine, sodium chloride flush  Allergies:    Allergies  Allergen Reactions  . Topamax [Topiramate] Other (See Comments)    Stroke like symptoms  . Aleve [Naproxen Sodium] Hives    Has tolerated Voltaren topical as well as aspirin.  . Bee Venom Swelling  . Echinacea Hives  . Other Other (See Comments)    Feathers cause sinus congestion  . Sulfa Antibiotics Hives  . Advil [Ibuprofen] Hives    Has tolerated Voltaren topical as well as aspirin.    Social History:   Social History   Socioeconomic History  . Marital status: Married    Spouse name: Remo Lipps  . Number of children: 2  . Years of education: 30  . Highest education level: Not on file  Social Needs  . Financial resource strain: Not on file  . Food insecurity - worry: Not on file  . Food insecurity - inability: Not on file  . Transportation needs - medical: Not on file  . Transportation needs - non-medical: Not on file  Occupational History  . Occupation: Geneticist, molecular: Psychologist, sport and exercise [employer]  Tobacco Use  . Smoking status: Never Smoker  . Smokeless tobacco: Never Used  . Tobacco comment: Secondhand - from family growing up, workplace intermittently  Substance and Sexual Activity  . Alcohol use: Yes    Alcohol/week: 0.0 oz    Comment: occasionally - intermittent, no more than twice a  week  . Drug  use: No  . Sexual activity: Not on file  Other Topics Concern  . Not on file  Social History Narrative   Patient is married Remo Lipps) and lives at home with her husband and child.   Patient has two children.   Patient has a college education.   Patient is right-handed.   Patient drinks 1-2 cup of coffee/tea daily.    Family History:   Family History  Problem Relation Age of Onset  . Hypertension Mother   . Stroke Mother   . Liver disease Mother        Abcess  . Hypertension Father   . Coronary artery disease Father   . Migraines Father   . Clotting disorder Father   . Kidney failure Brother   . Hypertension Brother   . Migraines Brother   . Migraines Daughter   . Breast cancer Other        Niece with breast cancer  . Colon cancer Paternal Grandmother   . Pancreatic cancer Paternal Grandmother   . Stomach cancer Paternal Grandmother   . Breast cancer Cousin   . Esophageal cancer Neg Hx   . Rectal cancer Neg Hx    Family Status:  Family Status  Relation Name Status  . Mother  Deceased  . Father  Deceased       Leg amputation due to perpheral vascular disease  . Brother  Deceased  . Daughter  Alive  . Brother  Alive  . Other  (Not Specified)  . PGM  Deceased  . Cousin  (Not Specified)  . MGM  Deceased  . MGF  Deceased  . PGF  Deceased  . Neg Hx  (Not Specified)    ROS:  Please see the history of present illness.  All other ROS reviewed and negative.     Physical Exam/Data:   Vitals:   04/19/17 1807 04/19/17 2033 04/20/17 0118 04/20/17 0511  BP: (!) 110/51 (!) 90/59  (!) 85/37  Pulse: 82 99  82  Resp: (!) 22 16  20   Temp: 97.8 F (36.6 C) 99.3 F (37.4 C)  97.9 F (36.6 C)  TempSrc: Oral Oral  Oral  SpO2: 100% 97%  92%  Weight:   251 lb (113.9 kg) 238 lb (108 kg)  Height: 5\' 3"  (1.6 m)  5\' 3"  (1.6 m)     Intake/Output Summary (Last 24 hours) at 04/20/2017 0911 Last data filed at 04/20/2017 0600 Gross per 24 hour  Intake 1070 ml  Output 400 ml    Net 670 ml   Filed Weights   04/20/17 0118 04/20/17 0511  Weight: 251 lb (113.9 kg) 238 lb (108 kg)   Body mass index is 42.16 kg/m.  General:  Well nourished, well developed, in no acute distress HEENT: normal Lymph: no adenopathy Neck: no JVD Endocrine:  No thryomegaly Vascular: No carotid bruits; 4/4 extremity pulses 2+, without bruits  Cardiac:  normal S1, S2; RRR; no murmur  Lungs:  clear to auscultation bilaterally, no wheezing, rhonchi or rales  Abd: soft, nontender, no hepatomegaly  Ext: no edema Musculoskeletal:  No deformities, BUE and BLE strength normal and equal Skin: warm and dry  Neuro:  CNs 2-12 intact, no focal abnormalities noted Psych:  Normal affect   EKG:  The EKG was personally reviewed and demonstrates:  01/25, SR, HR 84, no acute ischemic changes, no change from 04/18/2017 Telemetry:  Telemetry was personally reviewed and demonstrates:  SR, no sig ectopy  Relevant CV Studies:  ECHO: ordered, not done yet 12/15/2016 - Left ventricle: The cavity size was normal. Systolic function was   normal. The estimated ejection fraction was in the range of 60%   to 65%. Wall motion was normal; there were no regional wall   motion abnormalities. The study is not technically sufficient to   allow evaluation of LV diastolic function. Doppler parameters are   consistent with high ventricular filling pressure. - Aortic valve: Poorly visualized.  CATH: 02/2014 ANGIOGRAPHIC DATA:   The left main coronary artery is normal. The left anterior descending artery is normal. It wraps around the left ventricular apex. It gives origin to a large first diagonal which is also normal.. The left circumflex artery is codominant. Gives origin to a bifurcating second obtuse marginal and the PDA. The vessel is normal.. The right coronary artery is codominant. The vessel is normal.. LEFT VENTRICULOGRAM:  Left ventricular angiogram was done in the 30 RAO projection and revealed normal  cavity size with normal contractility. EF 60%.  IMPRESSIONS:  1. Normal coronary arteries 2. Normal systolic function with EF 60%. Elevated end-diastolic pressures compatible with diastolic heart failure.  HOLTER: 12/29/2016  NSR with occasional PAC's and PVC's  No tachy- or bradyarrhythmia  No diary Normal sinus with rare PAC's and PVC's Overall, unremarkable. No diary   Laboratory Data:  Chemistry Recent Labs  Lab 04/16/17 0333 04/17/17 0420 04/18/17 0952 04/19/17 1858  NA 135 134*  --  131*  K 3.2* 4.0  --  3.4*  CL 103 102  --  97*  CO2 25 28  --  24  GLUCOSE 144* 130*  --  166*  BUN 5* <5*  --  8  CREATININE 0.59 0.54 0.59 0.65  CALCIUM 8.3* 8.6*  --  8.8*  GFRNONAA >60 >60 >60 >60  GFRAA >60 >60 >60 >60  ANIONGAP 7 4*  --  10    Lab Results  Component Value Date   ALT 19 04/19/2017   AST 23 04/19/2017   ALKPHOS 173 (H) 04/19/2017   BILITOT 0.4 04/19/2017   Hematology Recent Labs  Lab 04/15/17 1445 04/17/17 0420 04/19/17 1858  WBC 14.8* 12.7* 9.5  RBC 2.82* 2.98* 3.30*  HGB 7.7* 8.1* 9.0*  HCT 24.4* 25.7* 28.2*  MCV 86.5 86.2 85.5  MCH 27.3 27.2 27.3  MCHC 31.6 31.5 31.9  RDW 18.8* 19.2* 19.8*  PLT 115* 108* 114*   Cardiac Enzymes Recent Labs  Lab 04/14/17 0439 04/19/17 1858 04/20/17 0002 04/20/17 0652  TROPONINI <0.03 <0.03 <0.03 <0.03    Recent Labs  Lab 04/13/17 1427  TROPIPOC 0.00    TSH: No results found for: TSH - ordered  Lipids: Lab Results  Component Value Date   CHOL 146 07/28/2015   HDL 31.30 (L) 07/28/2015   LDLCALC 88 07/28/2015   TRIG 133.0 07/28/2015   CHOLHDL 5 07/28/2015   HgbA1c: Lab Results  Component Value Date   HGBA1C 7.2 (H) 09/29/2016   Magnesium:  Magnesium  Date Value Ref Range Status  04/13/2017 1.6 1.5 - 2.5 mg/dL Final    Comment:    Performed at [facility], [place] [address] [place], [place] [address]     Radiology/Studies:  Dg Chest 2 View  Result Date:  04/19/2017 CLINICAL DATA:  Hypotension and history of pancreatic carcinoma EXAM: CHEST  2 VIEW COMPARISON:  04/13/2017 FINDINGS: Right-sided chest wall port is again seen. Elevation the right hemidiaphragm is again noted. The lungs are  well aerated bilaterally. Minimal right basilar atelectasis is again seen. No new focal abnormality is noted. IMPRESSION: Stable right basilar atelectasis. Electronically Signed   By: Inez Catalina M.D.   On: 04/19/2017 20:49    Assessment and Plan:   Active Problems: 1.  Hypotension - unclear cause, ?S.E. From chemo - po intake of liquids is good, not dehydrated by labs - TSH ordered, cortisol level at 7 pm was 11.5, ok - Frederick advise on Cosintropin test - have encouraged pt/husband to look into home HR monitor to make sure HR is not >150 during episodes, but probably not - at the end of one episode, HR was about 100, sounds more systemic than cardiac - Hx PACs and PVCs on Holter monitor 12/2016 so not sure insurance will cover event monitor or ILR, but reassured pt and husband that no significant arrhythmia seen - per pt, taking Ativan early on minimizes sx - further eval per Frederick  Otherwise, per IM   Anemia   Thrombocytopenia ([place])   Leukocytosis     For questions or updates, please contact [facility] Please consult [email or web address] for contact info under Cardiology/STEMI.   Signed, Darreld Mclean, Student-PA Rosaria Ferries, PA-C  04/20/2017 9:11 AM

## 2017-04-20 NOTE — Care Management Note (Signed)
Case Management Note  Patient Details  Name: Heather Frederick MRN: [id number] Date of Birth: [birth year]  Subjective/Objective: 62 y/o f admitted w/Anemia. From home. Has pcp,pharmacy.                   Action/Plan:d/c plan home.   Expected Discharge Date:                  Expected Discharge Plan:  Home/Self Care  In-House Referral:     Discharge planning Services  CM Consult  Post Acute Care Choice:    Choice offered to:     DME Arranged:    DME Agency:     HH Arranged:    HH Agency:     Status of Service:  In process, will continue to follow  If discussed at Long Length of Stay Meetings, dates discussed:    Additional Comments:  Dessa Phi, RN 04/20/2017, 11:33 AM

## 2017-04-21 DIAGNOSIS — C251 Malignant neoplasm of body of pancreas: Secondary | ICD-10-CM | POA: Diagnosis not present

## 2017-04-21 DIAGNOSIS — I951 Orthostatic hypotension: Secondary | ICD-10-CM | POA: Diagnosis not present

## 2017-04-21 DIAGNOSIS — R000 Tachycardia, unspecified: Secondary | ICD-10-CM | POA: Diagnosis not present

## 2017-04-21 DIAGNOSIS — R509 Fever, unspecified: Secondary | ICD-10-CM | POA: Diagnosis not present

## 2017-04-21 DIAGNOSIS — R0609 Other forms of dyspnea: Secondary | ICD-10-CM | POA: Diagnosis not present

## 2017-04-21 DIAGNOSIS — C787 Secondary malignant neoplasm of liver and intrahepatic bile duct: Secondary | ICD-10-CM | POA: Diagnosis not present

## 2017-04-21 LAB — COMPREHENSIVE METABOLIC PANEL
ALT: 19 U/L (ref 14–54)
ANION GAP: 6 (ref 5–15)
AST: 26 U/L (ref 15–41)
Albumin: 2.7 g/dL — ABNORMAL LOW (ref 3.5–5.0)
Alkaline Phosphatase: 166 U/L — ABNORMAL HIGH (ref 38–126)
BUN: <5 mg/dL — ABNORMAL LOW (ref 6–20)
CHLORIDE: 101 mmol/L (ref 101–111)
CO2: 26 mmol/L (ref 22–32)
Calcium: 8.2 mg/dL — ABNORMAL LOW (ref 8.9–10.3)
Creatinine, Ser: 0.59 mg/dL (ref 0.44–1.00)
Glucose, Bld: 116 mg/dL — ABNORMAL HIGH (ref 65–99)
Potassium: 3.6 mmol/L (ref 3.5–5.1)
SODIUM: 133 mmol/L — AB (ref 135–145)
Total Bilirubin: 0.4 mg/dL (ref 0.3–1.2)
Total Protein: 6.1 g/dL — ABNORMAL LOW (ref 6.5–8.1)

## 2017-04-21 LAB — URINE CULTURE
Culture: NO GROWTH
SPECIAL REQUESTS: NORMAL

## 2017-04-21 LAB — CORTISOL-AM, BLOOD: CORTISOL - AM: 10.4 ug/dL (ref 6.7–22.6)

## 2017-04-21 MED ORDER — MIDODRINE HCL 5 MG PO TABS: 5.0000 mg | ORAL_TABLET | Freq: Three times a day (TID) | ORAL | 0 refills | Status: DC

## 2017-04-21 MED ORDER — HEPARIN SOD (PORK) LOCK FLUSH 100 UNIT/ML IV SOLN
500.0000 [IU] | INTRAVENOUS | Status: AC | PRN
  Administered 2017-04-21: 500 [IU]

## 2017-04-21 MED ORDER — PROSIGHT PO TABS
1.0000 | ORAL_TABLET | Freq: Every day | ORAL | Status: DC
  Administered 2017-04-21: 1 via ORAL
  Filled 2017-04-21: qty 1

## 2017-04-21 MED ORDER — HYDROMORPHONE HCL 2 MG PO TABS
2.0000 mg | ORAL_TABLET | ORAL | Status: DC | PRN
  Administered 2017-04-21: 2 mg via ORAL
  Filled 2017-04-21: qty 1

## 2017-04-21 NOTE — Progress Notes (Signed)
IP PROGRESS NOTE  Subjective:   Ms. Rainone reports no recurrent episodes of diaphoresis or dizziness.  No nausea or diarrhea.  She continues to have pain at the right lower back and right lateral abdomen.  The pain is partially relieved with hydrocodone.  Objective: Vital signs in last 24 hours: Blood pressure (!) 102/34, pulse 87, temperature 98.8 F (37.1 C), temperature source Oral, resp. rate 16, height '5\' 3"'  (1.6 m), weight 255 lb 3.2 oz (115.8 kg), SpO2 100 %.  Intake/Output from previous day: 01/25 0701 - 01/26 0700 In: 240 [P.O.:240] Out: 1000 [Urine:1000]  Physical Exam:  Musculoskeletal: Tender at the right upper lateral abdomen/costal margin  Extremities: No leg edema   Portacath/PICC-without erythema or tenderness  Lab Results: Recent Labs    04/19/17 1858 04/20/17 1429  WBC 9.5 11.2*  HGB 9.0* 8.5*  HCT 28.2* 27.6*  PLT 114* 127*    BMET Recent Labs    04/19/17 1858 04/21/17 0407  NA 131* 133*  K 3.4* 3.6  CL 97* 101  CO2 24 26  GLUCOSE 166* 116*  BUN 8 <5*  CREATININE 0.65 0.59  CALCIUM 8.8* 8.2*    No results found for: CEA1  Studies/Results: Dg Chest 2 View  Result Date: 04/20/2017 CLINICAL DATA:  History of metastatic pancreatic cancer. Low blood pressure. EXAM: CHEST  2 VIEW COMPARISON:  04/19/2017 FINDINGS: Stable right-sided injectable port. Cardiomediastinal silhouette is normal. Mediastinal contours appear intact. There is no evidence of focal airspace consolidation, pleural effusion or pneumothorax. Chronic elevation of the hemidiaphragms with atelectatic changes at the lung bases. Osseous structures are without acute abnormality. Soft tissues are grossly normal. IMPRESSION: Low lung volume with chronic elevation of the hemidiaphragms and atelectatic changes at the lung bases. Electronically Signed   By: Fidela Salisbury M.D.   On: 04/20/2017 12:17   Dg Chest 2 View  Result Date: 04/19/2017 CLINICAL DATA:  Hypotension and history of  pancreatic carcinoma EXAM: CHEST  2 VIEW COMPARISON:  04/13/2017 FINDINGS: Right-sided chest wall port is again seen. Elevation the right hemidiaphragm is again noted. The lungs are well aerated bilaterally. Minimal right basilar atelectasis is again seen. No new focal abnormality is noted. IMPRESSION: Stable right basilar atelectasis. Electronically Signed   By: Inez Catalina M.D.   On: 04/19/2017 20:49    Medications: I have reviewed the patient's current medications.  Assessment/Plan:  1. Metastatic pancreas cancer ? Pancreas body mass and liver metastases noted on CT abdomen/pelvis 08/11/2016 ? Ultrasound-guided biopsy of a right liver lesion 08/16/2016 revealed poorly differentiated adenocarcinoma consistent with pancreas cancer ? Foundation 1-microsatellite stable; tumor mutational burden 1; ERBB2 amplification ? Cycle 1 gemcitabine/Abraxane 09/13/2016; 09/29/2016 ? Cycle 2 gemcitabine/Abraxane 10/11/2016, 10/18/2016 ? Cycle 3 gemcitabine/Abraxane 11/01/2016, 11/08/2016 ? Cycle 4 gemcitabine/Abraxane 11/23/2016,11/29/2016 ? CT chest 12/06/2016-liver lesions appear smaller ? Cycle 5 gemcitabine/Abraxane 12/13/2016 ? CT abdomen/pelvis 01/01/2017-new and enlarging hepatic masses. Enlarging pancreatic mass. ? Cycle 6 gemcitabine/Abraxane 01/03/2017 ? Rising CA 19-9, increased pain October 2018 ? Cycle 1 FOLFOX 01/31/2017 ? Cycle 2 FOLFOX 02/19/2017 ? Cycle 3FOLFIRINOX 03/06/2017 ? CT 03/11/2017 (compared to 01/01/2017) increased liver lesions and enlargement of the pancreas mass ? Cycle4FOLFIRINOX 03/21/2017 (oxaliplatin dose reduced and Neulasta added) ? Cycle 5 FOLFIRINOX 04/09/2017  2. Pain secondary to pancreas cancer, MS Contin added 03/13/2017  3. Hypertension  4. Sleep apnea  5. Chronic low back pain  6. Recurrent urinary tract infections  7. Depression  8. Migraine headaches  9. Port-A-Cath placement 08/28/2016  10. Rash following  cycle 1  gemcitabine/Abraxane-drug rash?  11. Nausea/vomiting following cycle 1 gemcitabine/Abraxane-antiemetic regimen adjusted with addition of Aloxi  12. Diabetes  13. Right cephalic vein thrombosis [id number] with Xarelto  14. CT chest 12/06/2016 done to evaluate dyspnea-several 3-4 mm nodular opacities in the lung parenchyma etiology uncertain. Known mass in the body of the pancreas measuring 3.3 x 2.4 cm;small enhancing lesion in the anterior dome of the liver measures 8 mm.  15. Right forearm rash following cycle 5 day 8 gemcitabine/Abraxane. Resolved  16.Klebsiella urinary tract infection 03/14/2017  17.  Admission 04/13/2017 with diaphoresis and tachycardia-resolved, 1 blood culture positive for a coagulase-negative Staphylococcus  18.  Admission 04/19/2017 with recurrent hypotension  She appears at baseline this morning.  No recurrent episodes of hypotension or diaphoresis.  She had a low-grade fever last night, possibly tumor fever.  The Port-A-Cath does not appear infected.  Multiple repeat blood cultures are negative.  Recommendations: 1.  Follow-up cortisol level from this morning 2.  Check CA 19-9  3.  Follow-up as scheduled at the Cancer center 04/24/2017, the next cycle of FOLFIRINOX will most likely be delayed    LOS: 0 days   Betsy Coder, MD   04/21/2017, 8:28 AM

## 2017-04-21 NOTE — Discharge Summary (Signed)
Discharge Summary  Heather Frederick:[id number] DOB: [birth year]  PCP: [name], MD  Admit date: 04/19/2017 Discharge date: 04/21/2017  Time spent: <25 minutes  Admitted From:Home Disposition: Home  Recommendations for Outpatient Follow-up:  1. Follow up with PCP in 1-2 weeks 2. Start midodrine 5 mg TID for orthostatic hypotension 3. Please follow up on the following pending results: Cortisol  Home Health:No Equipment/Devices:None  Discharge Diagnoses:  Active Hospital Problems   Diagnosis Date Noted  . Dyspnea   . Anemia 04/19/2017  . Thrombocytopenia ([place]) 04/19/2017  . Leukocytosis 04/19/2017  . Orthostatic hypotension 03/15/2017    Resolved Hospital Problems  No resolved problems to display.    Discharge Condition: Stable  CODE STATUS:Full Diet recommendation: Heart Healthy / Carb Modified / Regular / Dysphagia   Vitals:   04/21/17 1115 04/21/17 1241  BP: (!) 107/52 (!) 104/52  Pulse: 85 78  Resp:    Temp:  98.1 F (36.7 C)  SpO2:  100%    History of present illness:  Heather Frederick is a 62 y.o. year old female with medical history significant for Metastatic pancreatic cancer with liver metastasis (treated by Dr. Betsy Coder), DVT on xarelto, HTN, Depression, GERD, HLD, Obesity, OSA on CPAP who presented on 04/19/2017 with from her PCP's office for complaints of anxiety and was found to have a SBP of 80.  She was directly admitted for concerns of sepsis.    Hospital Course:  Active Problems:   Orthostatic hypotension   Anemia   Thrombocytopenia (HCC)   Leukocytosis   Dyspnea    Hypotension,suspect orthostatic hypotension  She was recently admitted from 1/18-1/23 for SIRS and hypotension empirically treated with vancomycin and zosyn during the first few days of her prior admission. One blood culture grew Coag neg staph(deemed a contaminant) , while blood culture from her port remaine negative. Given no infectious source identified and no true  bacteremia team discussed with patient's oncologist at the time to discontinue antibiotics and she would have repeat blood cultures as an outpatient.    Patient was doing relatively well after discharge but during evaluation by her PCP was noted to have blood pressure in the 80s.  Patient also endorsed anxiety, chest pain, nausea, whole body chills during that time consistent with her prior presentation.    During this hospitalization, patient was monitored and remained afebrile with no leukocytosis.  Chest x-ray and UA were negative. Port site looked clean with no signs of infection. Cortisol was obtained and was normal at 11.5(was collected at 6 PM instead of 8 AM).  TSH was also found to be normal.  Empiric Vanc and Zosyn were discontinued after day of admission and over the next 24 hours she remained afebrile with no leukocytosis and no recurrent episodes of hypotension.  Patient's blood pressure improved with IV fluids.  On day of discharge patient systolic blood pressure noted to be 109 with diastolics in the 32T-55D.  Unfortunately a.m. cortisol obtained on the morning of discharge was not resulted prior to her leaving the hospital.  (Contacted the lab who reported this would take up to 24 hours to finalize).  Given patient's improvement in blood pressure with supportive care and her ability to maintain blood pressure off IV fluids I thought adrenal insufficiency was less likely.  Cortisol will need to be followed up as outpatient.   Patient was discharged with midodrine 5 mg TID as orthostatic vitals were present with greater than 10 mmg Hg drop in diastolic (  lying 106/54, sitting 103/38, Standing 106/40).   Metastatic pancreatic cancer, lesions to liver Patient's oncologist Dr. Benay Spice will patient do her hospitalization.  Initially, patient expressed concern that her hypotension was related to her chemotherapyinfusions; however, she has had an effusion in at least a week and still present with  this episode.  Her oncologist ordered a CA-19-9 for her to be follow-up as an outpatient.  She has follow-up scheduled with Dr. Learta Codding on 1/29. Continue home pain regimen of MS contin 30 mg BID, norco eery 6 Hrs PRN  Tachycardia, resolved Suspect compensatory mechanism due to orthostatic hypotension.  Cardiology was consulted during hospitalization and no concern for other arrhythmia given Holter monitoring in October 2018 which showed occasional PACs and PVCs.  Of note patient also had a normal echo in September 2018.  Dyspnea on exertion, resolved Patient did not requiring any oxygen during hospitalization and no tachypnea or hypoxia.  Chest x-ray on this admission was negative.  CTA chest from recent hospitalization was also negative for PE.  Pulmonology was consulted who agreed with our evaluation.  Supportive care was continue with incentive spirometry monitoring.  Suspect some of symptoms related to anxiety.  Hypovolemic hyponatremia Admission sodium 131 improved to133 prior to discharge.  Improved supportive care with increased p.o. intake and timed IV fluids.   Consultations:  Cardiology, Pulmonology, Oncology   Procedures/Studies: Dg Chest 2 View  Result Date: 04/20/2017 CLINICAL DATA:  History of metastatic pancreatic cancer. Low blood pressure. EXAM: CHEST  2 VIEW COMPARISON:  04/19/2017 FINDINGS: Stable right-sided injectable port. Cardiomediastinal silhouette is normal. Mediastinal contours appear intact. There is no evidence of focal airspace consolidation, pleural effusion or pneumothorax. Chronic elevation of the hemidiaphragms with atelectatic changes at the lung bases. Osseous structures are without acute abnormality. Soft tissues are grossly normal. IMPRESSION: Low lung volume with chronic elevation of the hemidiaphragms and atelectatic changes at the lung bases. Electronically Signed   By: Fidela Salisbury M.D.   On: 04/20/2017 12:17   Dg Chest 2 View  Result Date:  04/19/2017 CLINICAL DATA:  Hypotension and history of pancreatic carcinoma EXAM: CHEST  2 VIEW COMPARISON:  04/13/2017 FINDINGS: Right-sided chest wall port is again seen. Elevation the right hemidiaphragm is again noted. The lungs are well aerated bilaterally. Minimal right basilar atelectasis is again seen. No new focal abnormality is noted. IMPRESSION: Stable right basilar atelectasis. Electronically Signed   By: Inez Catalina M.D.   On: 04/19/2017 20:49   Dg Chest 2 View  Result Date: 04/13/2017 CLINICAL DATA:  Shortness of breath. EXAM: CHEST  2 VIEW COMPARISON:  Radiograph of February 23, 2017. FINDINGS: Stable cardiomediastinal silhouette. No pneumothorax is noted. Elevated right hemidiaphragm is noted with mild right basilar subsegmental atelectasis. Right internal jugular Port-A-Cath is noted with distal tip in expected position of the SVC. Left lung is clear. No significant pleural effusion is noted. Bony thorax is unremarkable. IMPRESSION: Elevated right hemidiaphragm with mild right basilar subsegmental atelectasis. No other significant abnormality is noted. Electronically Signed   By: Marijo Conception, M.D.   On: 04/13/2017 14:56   Ct Angio Chest Pe W/cm &/or Wo Cm  Result Date: 04/13/2017 CLINICAL DATA:  Pancreatic carcinoma. Referral cancer center for hypotension and malaise. Chills and nausea and vomiting. Chronic RIGHT-sided abdominal pain. EXAM: CT ANGIOGRAPHY CHEST CT ABDOMEN AND PELVIS WITH CONTRAST TECHNIQUE: Multidetector CT imaging of the chest was performed using the standard protocol during bolus administration of intravenous contrast. Multiplanar CT image reconstructions and MIPs were  obtained to evaluate the vascular anatomy. Multidetector CT imaging of the abdomen and pelvis was performed using the standard protocol during bolus administration of intravenous contrast. CONTRAST:  110mL ISOVUE-370 IOPAMIDOL (ISOVUE-370) INJECTION 76% COMPARISON:  CT chest abdomen pelvis 03/11/2017  FINDINGS: CTA CHEST FINDINGS Cardiovascular: No filling defects within the pulmonary arteries arteries to suggest acute pulmonary embolism. No significant vascular findings. Normal heart size. No pericardial effusion. Mediastinum/Nodes: No axillary supraclavicular adenopathy. Port in the RIGHT chest wall with tip in distal SVC. No mediastinal hilar adenopathy. No pericardial fluid. Esophagus normal. Lungs/Pleura: 6 mm RIGHT upper lobe pulmonary nodule is not changed compared to 03/11/2017. Lesions increased in size from 12/06/2016 no new pulmonary nodules. Musculoskeletal: No aggressive osseous lesion Review of the MIP images confirms the above findings. CT ABDOMEN and PELVIS FINDINGS Hepatobiliary: Multiple round low-density lesions in liver parenchyma consistent with hepatic metastasis are not changed in short interval from 03/11/2017. No new lesions. Postcholecystectomy. Pancreas: Lesion head of pancreas measures smaller at 3.7 by 4.9 cm compared to 4.5 x 5.2 cm. There is atrophy duct dilatation the body and tail the pancreas. Spleen: Geographic low attenuation within the medial aspect the spleen is likely a profusion phenomena (image 34, series 4). Adrenals/urinary tract: Adrenal glands and kidneys are normal. The ureters and bladder normal. Stomach/Bowel: Stomach, small bowel, appendix, and cecum are normal. The colon and rectosigmoid colon are normal. Vascular/Lymphatic: Abdominal aorta is normal caliber. There is no retroperitoneal or periportal lymphadenopathy. No pelvic lymphadenopathy. Reproductive: Post hysterectomy. Other: No peritoneal nodularity Musculoskeletal: No aggressive osseous lesion. Review of the MIP images confirms the above findings. IMPRESSION: Chest Impression: 1. No evidence acute pulmonary embolism. 2. Suspicious RIGHT upper lobe pulmonary nodule not changed in short interval from 03/11/2017. Abdomen / Pelvis Impression: 1. Interval decrease in size of pancreatic mass in short interval  follow-up. 2. Stable bilobed hepatic metastasis. 3. No peritoneal metastatic disease or progressive adenopathy. Electronically Signed   By: Suzy Bouchard M.D.   On: 04/13/2017 17:55   Ct Abdomen Pelvis W Contrast  Result Date: 04/13/2017 CLINICAL DATA:  Pancreatic carcinoma. Referral cancer center for hypotension and malaise. Chills and nausea and vomiting. Chronic RIGHT-sided abdominal pain. EXAM: CT ANGIOGRAPHY CHEST CT ABDOMEN AND PELVIS WITH CONTRAST TECHNIQUE: Multidetector CT imaging of the chest was performed using the standard protocol during bolus administration of intravenous contrast. Multiplanar CT image reconstructions and MIPs were obtained to evaluate the vascular anatomy. Multidetector CT imaging of the abdomen and pelvis was performed using the standard protocol during bolus administration of intravenous contrast. CONTRAST:  164mL ISOVUE-370 IOPAMIDOL (ISOVUE-370) INJECTION 76% COMPARISON:  CT chest abdomen pelvis 03/11/2017 FINDINGS: CTA CHEST FINDINGS Cardiovascular: No filling defects within the pulmonary arteries arteries to suggest acute pulmonary embolism. No significant vascular findings. Normal heart size. No pericardial effusion. Mediastinum/Nodes: No axillary supraclavicular adenopathy. Port in the RIGHT chest wall with tip in distal SVC. No mediastinal hilar adenopathy. No pericardial fluid. Esophagus normal. Lungs/Pleura: 6 mm RIGHT upper lobe pulmonary nodule is not changed compared to 03/11/2017. Lesions increased in size from 12/06/2016 no new pulmonary nodules. Musculoskeletal: No aggressive osseous lesion Review of the MIP images confirms the above findings. CT ABDOMEN and PELVIS FINDINGS Hepatobiliary: Multiple round low-density lesions in liver parenchyma consistent with hepatic metastasis are not changed in short interval from 03/11/2017. No new lesions. Postcholecystectomy. Pancreas: Lesion head of pancreas measures smaller at 3.7 by 4.9 cm compared to 4.5 x 5.2 cm. There  is atrophy duct dilatation the body and tail  the pancreas. Spleen: Geographic low attenuation within the medial aspect the spleen is likely a profusion phenomena (image 34, series 4). Adrenals/urinary tract: Adrenal glands and kidneys are normal. The ureters and bladder normal. Stomach/Bowel: Stomach, small bowel, appendix, and cecum are normal. The colon and rectosigmoid colon are normal. Vascular/Lymphatic: Abdominal aorta is normal caliber. There is no retroperitoneal or periportal lymphadenopathy. No pelvic lymphadenopathy. Reproductive: Post hysterectomy. Other: No peritoneal nodularity Musculoskeletal: No aggressive osseous lesion. Review of the MIP images confirms the above findings. IMPRESSION: Chest Impression: 1. No evidence acute pulmonary embolism. 2. Suspicious RIGHT upper lobe pulmonary nodule not changed in short interval from 03/11/2017. Abdomen / Pelvis Impression: 1. Interval decrease in size of pancreatic mass in short interval follow-up. 2. Stable bilobed hepatic metastasis. 3. No peritoneal metastatic disease or progressive adenopathy. Electronically Signed   By: Suzy Bouchard M.D.   On: 04/13/2017 17:55   (Echo, Carotid, EGD, Colonoscopy, ERCP)   Discharge Exam: BP (!) 104/52 (BP Location: Right Arm)   Pulse 78   Temp 98.1 F (36.7 C) (Oral)   Resp 16   Ht 5\' 3"  (1.6 m)   Wt 115.8 kg (255 lb 3.2 oz)   SpO2 100%   BMI 45.21 kg/m   General: sitting up in bed in no distress Eyes: EOMI, anicteric Cardiovascular: regular rate and rhythm, no murmurs, rubs or gallops, no edema Respiratory: Normal respiratory effort, lungs clear to auscultation bilaterally, no rales, wheezes or rhonchi Abdomen: soft, non-distended, non-tender, normal bowel sounds, no guarding or rebound tenderness Skin: No Rash Musculoskeletal:No clubbing / cyanosis. No joint deformity upper and lower extremities.  Neurologic: Grossly no focal neuro deficit.Mental status AAOx3, speech  normal, Psychiatric:Appropriate affect, and mood   Discharge Instructions You were cared for by a hospitalist during your hospital stay. If you have any questions about your discharge medications or the care you received while you were in the hospital after you are discharged, you can call the unit and asked to speak with the hospitalist on call if the hospitalist that took care of you is not available. Once you are discharged, your primary care physician will handle any further medical issues. Please note that NO REFILLS for any discharge medications will be authorized once you are discharged, as it is imperative that you return to your primary care physician (or establish a relationship with a primary care physician if you do not have one) for your aftercare needs so that they can reassess your need for medications and monitor your lab values.  Discharge Instructions    Diet - low sodium heart healthy   Complete by:  As directed    Diet - low sodium heart healthy   Complete by:  As directed    Increase activity slowly   Complete by:  As directed    Increase activity slowly   Complete by:  As directed      Allergies as of 04/21/2017      Reactions   Topamax [topiramate] Other (See Comments)   Stroke like symptoms   Aleve [naproxen Sodium] Hives   Has tolerated Voltaren topical as well as aspirin.   Bee Venom Swelling   Echinacea Hives   Other Other (See Comments)   Feathers cause sinus congestion   Sulfa Antibiotics Hives   Advil [ibuprofen] Hives   Has tolerated Voltaren topical as well as aspirin.      Medication List    TAKE these medications   aspirin EC 81 MG tablet Take 81 mg  by mouth daily.   citalopram 20 MG tablet Commonly known as:  CELEXA TAKE ONE (1) TABLET BY MOUTH EVERY DAY   CRANBERRY PLUS VITAMIN C PO Take 1 tablet by mouth daily. 4200 mg   cyclobenzaprine 5 MG tablet Commonly known as:  FLEXERIL TAKE ONE (1) TABLET BY MOUTH 3 TIMES DAILY AS NEEDED FOR  MUSCLE SPASMS   eletriptan 40 MG tablet Commonly known as:  RELPAX Take 1 tablet (40 mg total) by mouth every 2 (two) hours as needed for migraine.   HYDROcodone-acetaminophen 5-325 MG tablet Commonly known as:  NORCO/VICODIN Take 1 tablet by mouth every 6 (six) hours as needed for moderate pain.   HYDROmorphone 4 MG tablet Commonly known as:  DILAUDID Take 0.5 tablets (2 mg total) by mouth every 4 (four) hours as needed for severe pain. What changed:  how much to take   hydrOXYzine 10 MG tablet Commonly known as:  ATARAX/VISTARIL Take 10 mg by mouth every 8 (eight) hours as needed for itching.   insulin detemir 100 unit/ml Soln Commonly known as:  LEVEMIR Inject 10 Units into the skin at bedtime.   insulin lispro 100 UNIT/ML injection Commonly known as:  HUMALOG Inject 2-10 Units into the skin. Sliding Scale   lidocaine-prilocaine cream Commonly known as:  EMLA APPLY 1 APPLICATION TOPICALLY AS NEEDED.APPLY TO PORT A CATH SITE 1 HOUR PRIOR TO NEEDLE STICK.   LORazepam 1 MG tablet Commonly known as:  ATIVAN Take 1 tablet (1 mg total) by mouth every 8 (eight) hours as needed for anxiety or sedation (or nausea).   LYRICA 200 MG capsule Generic drug:  pregabalin Take 200 mg by mouth daily.   midodrine 5 MG tablet Commonly known as:  PROAMATINE Take 1 tablet (5 mg total) by mouth 3 (three) times daily with meals.   morphine 30 MG 12 hr tablet Commonly known as:  MS CONTIN Take 30 mg by mouth every 12 (twelve) hours.   multivitamin-lutein Caps capsule Take 1 capsule by mouth daily.   nitroGLYCERIN 0.4 MG SL tablet Commonly known as:  NITROSTAT Place 1 tablet (0.4 mg total) under the tongue every 5 (five) minutes as needed for chest pain.   ondansetron 8 MG disintegrating tablet Commonly known as:  ZOFRAN-ODT Take 1 tablet (8 mg total) by mouth every 8 (eight) hours as needed for nausea or vomiting.   oxybutynin 10 MG 24 hr tablet Commonly known as:   DITROPAN-XL Take 1 tablet (10 mg total) by mouth every morning.   oxyCODONE 5 MG immediate release tablet Commonly known as:  Oxy IR/ROXICODONE TAKE 1 TABLET BY MOUTH EVERY 4 HOURS AS NEEDED FOR SEVERE PAIN   pantoprazole 40 MG tablet Commonly known as:  PROTONIX TAKE ONE (1) TABLET BY MOUTH TWO (2) TIMES DAILY   prochlorperazine 10 MG tablet Commonly known as:  COMPAZINE Take 10 mg by mouth every 6 (six) hours as needed for nausea or vomiting.   simethicone 125 MG chewable tablet Commonly known as:  MYLICON Chew 188-416 mg by mouth 2 (two) times daily as needed for flatulence.   vitamin B-12 1000 MCG tablet Commonly known as:  CYANOCOBALAMIN Take 2,500 mcg by mouth every morning.   XARELTO 20 MG Tabs tablet Generic drug:  rivaroxaban Take 20 mg daily by mouth.      Allergies  Allergen Reactions  . Topamax [Topiramate] Other (See Comments)    Stroke like symptoms  . Aleve [Naproxen Sodium] Hives    Has tolerated Voltaren topical  as well as aspirin.  . Bee Venom Swelling  . Echinacea Hives  . Other Other (See Comments)    Feathers cause sinus congestion  . Sulfa Antibiotics Hives  . Advil [Ibuprofen] Hives    Has tolerated Voltaren topical as well as aspirin.      The results of significant diagnostics from this hospitalization (including imaging, microbiology, ancillary and laboratory) are listed below for reference.    Significant Diagnostic Studies: Dg Chest 2 View  Result Date: 04/20/2017 CLINICAL DATA:  History of metastatic pancreatic cancer. Low blood pressure. EXAM: CHEST  2 VIEW COMPARISON:  04/19/2017 FINDINGS: Stable right-sided injectable port. Cardiomediastinal silhouette is normal. Mediastinal contours appear intact. There is no evidence of focal airspace consolidation, pleural effusion or pneumothorax. Chronic elevation of the hemidiaphragms with atelectatic changes at the lung bases. Osseous structures are without acute abnormality. Soft tissues are  grossly normal. IMPRESSION: Low lung volume with chronic elevation of the hemidiaphragms and atelectatic changes at the lung bases. Electronically Signed   By: Fidela Salisbury M.D.   On: 04/20/2017 12:17   Dg Chest 2 View  Result Date: 04/19/2017 CLINICAL DATA:  Hypotension and history of pancreatic carcinoma EXAM: CHEST  2 VIEW COMPARISON:  04/13/2017 FINDINGS: Right-sided chest wall port is again seen. Elevation the right hemidiaphragm is again noted. The lungs are well aerated bilaterally. Minimal right basilar atelectasis is again seen. No new focal abnormality is noted. IMPRESSION: Stable right basilar atelectasis. Electronically Signed   By: Inez Catalina M.D.   On: 04/19/2017 20:49   Dg Chest 2 View  Result Date: 04/13/2017 CLINICAL DATA:  Shortness of breath. EXAM: CHEST  2 VIEW COMPARISON:  Radiograph of February 23, 2017. FINDINGS: Stable cardiomediastinal silhouette. No pneumothorax is noted. Elevated right hemidiaphragm is noted with mild right basilar subsegmental atelectasis. Right internal jugular Port-A-Cath is noted with distal tip in expected position of the SVC. Left lung is clear. No significant pleural effusion is noted. Bony thorax is unremarkable. IMPRESSION: Elevated right hemidiaphragm with mild right basilar subsegmental atelectasis. No other significant abnormality is noted. Electronically Signed   By: Marijo Conception, M.D.   On: 04/13/2017 14:56   Ct Angio Chest Pe W/cm &/or Wo Cm  Result Date: 04/13/2017 CLINICAL DATA:  Pancreatic carcinoma. Referral cancer center for hypotension and malaise. Chills and nausea and vomiting. Chronic RIGHT-sided abdominal pain. EXAM: CT ANGIOGRAPHY CHEST CT ABDOMEN AND PELVIS WITH CONTRAST TECHNIQUE: Multidetector CT imaging of the chest was performed using the standard protocol during bolus administration of intravenous contrast. Multiplanar CT image reconstructions and MIPs were obtained to evaluate the vascular anatomy. Multidetector CT  imaging of the abdomen and pelvis was performed using the standard protocol during bolus administration of intravenous contrast. CONTRAST:  120mL ISOVUE-370 IOPAMIDOL (ISOVUE-370) INJECTION 76% COMPARISON:  CT chest abdomen pelvis 03/11/2017 FINDINGS: CTA CHEST FINDINGS Cardiovascular: No filling defects within the pulmonary arteries arteries to suggest acute pulmonary embolism. No significant vascular findings. Normal heart size. No pericardial effusion. Mediastinum/Nodes: No axillary supraclavicular adenopathy. Port in the RIGHT chest wall with tip in distal SVC. No mediastinal hilar adenopathy. No pericardial fluid. Esophagus normal. Lungs/Pleura: 6 mm RIGHT upper lobe pulmonary nodule is not changed compared to 03/11/2017. Lesions increased in size from 12/06/2016 no new pulmonary nodules. Musculoskeletal: No aggressive osseous lesion Review of the MIP images confirms the above findings. CT ABDOMEN and PELVIS FINDINGS Hepatobiliary: Multiple round low-density lesions in liver parenchyma consistent with hepatic metastasis are not changed in short interval from 03/11/2017.  No new lesions. Postcholecystectomy. Pancreas: Lesion head of pancreas measures smaller at 3.7 by 4.9 cm compared to 4.5 x 5.2 cm. There is atrophy duct dilatation the body and tail the pancreas. Spleen: Geographic low attenuation within the medial aspect the spleen is likely a profusion phenomena (image 34, series 4). Adrenals/urinary tract: Adrenal glands and kidneys are normal. The ureters and bladder normal. Stomach/Bowel: Stomach, small bowel, appendix, and cecum are normal. The colon and rectosigmoid colon are normal. Vascular/Lymphatic: Abdominal aorta is normal caliber. There is no retroperitoneal or periportal lymphadenopathy. No pelvic lymphadenopathy. Reproductive: Post hysterectomy. Other: No peritoneal nodularity Musculoskeletal: No aggressive osseous lesion. Review of the MIP images confirms the above findings. IMPRESSION: Chest  Impression: 1. No evidence acute pulmonary embolism. 2. Suspicious RIGHT upper lobe pulmonary nodule not changed in short interval from 03/11/2017. Abdomen / Pelvis Impression: 1. Interval decrease in size of pancreatic mass in short interval follow-up. 2. Stable bilobed hepatic metastasis. 3. No peritoneal metastatic disease or progressive adenopathy. Electronically Signed   By: Suzy Bouchard M.D.   On: 04/13/2017 17:55   Ct Abdomen Pelvis W Contrast  Result Date: 04/13/2017 CLINICAL DATA:  Pancreatic carcinoma. Referral cancer center for hypotension and malaise. Chills and nausea and vomiting. Chronic RIGHT-sided abdominal pain. EXAM: CT ANGIOGRAPHY CHEST CT ABDOMEN AND PELVIS WITH CONTRAST TECHNIQUE: Multidetector CT imaging of the chest was performed using the standard protocol during bolus administration of intravenous contrast. Multiplanar CT image reconstructions and MIPs were obtained to evaluate the vascular anatomy. Multidetector CT imaging of the abdomen and pelvis was performed using the standard protocol during bolus administration of intravenous contrast. CONTRAST:  176mL ISOVUE-370 IOPAMIDOL (ISOVUE-370) INJECTION 76% COMPARISON:  CT chest abdomen pelvis 03/11/2017 FINDINGS: CTA CHEST FINDINGS Cardiovascular: No filling defects within the pulmonary arteries arteries to suggest acute pulmonary embolism. No significant vascular findings. Normal heart size. No pericardial effusion. Mediastinum/Nodes: No axillary supraclavicular adenopathy. Port in the RIGHT chest wall with tip in distal SVC. No mediastinal hilar adenopathy. No pericardial fluid. Esophagus normal. Lungs/Pleura: 6 mm RIGHT upper lobe pulmonary nodule is not changed compared to 03/11/2017. Lesions increased in size from 12/06/2016 no new pulmonary nodules. Musculoskeletal: No aggressive osseous lesion Review of the MIP images confirms the above findings. CT ABDOMEN and PELVIS FINDINGS Hepatobiliary: Multiple round low-density lesions  in liver parenchyma consistent with hepatic metastasis are not changed in short interval from 03/11/2017. No new lesions. Postcholecystectomy. Pancreas: Lesion head of pancreas measures smaller at 3.7 by 4.9 cm compared to 4.5 x 5.2 cm. There is atrophy duct dilatation the body and tail the pancreas. Spleen: Geographic low attenuation within the medial aspect the spleen is likely a profusion phenomena (image 34, series 4). Adrenals/urinary tract: Adrenal glands and kidneys are normal. The ureters and bladder normal. Stomach/Bowel: Stomach, small bowel, appendix, and cecum are normal. The colon and rectosigmoid colon are normal. Vascular/Lymphatic: Abdominal aorta is normal caliber. There is no retroperitoneal or periportal lymphadenopathy. No pelvic lymphadenopathy. Reproductive: Post hysterectomy. Other: No peritoneal nodularity Musculoskeletal: No aggressive osseous lesion. Review of the MIP images confirms the above findings. IMPRESSION: Chest Impression: 1. No evidence acute pulmonary embolism. 2. Suspicious RIGHT upper lobe pulmonary nodule not changed in short interval from 03/11/2017. Abdomen / Pelvis Impression: 1. Interval decrease in size of pancreatic mass in short interval follow-up. 2. Stable bilobed hepatic metastasis. 3. No peritoneal metastatic disease or progressive adenopathy. Electronically Signed   By: Suzy Bouchard M.D.   On: 04/13/2017 17:55    Microbiology: Recent  Results (from the past 240 hour(s))  Urine culture     Status: Abnormal   Collection Time: 04/13/17  2:00 PM  Result Value Ref Range Status   Specimen Description URINE, RANDOM  Final   Special Requests NONE  Final   Culture MULTIPLE SPECIES PRESENT, SUGGEST RECOLLECTION (A)  Final   Report Status 04/15/2017 FINAL  Final  Blood culture (routine x 2)     Status: None   Collection Time: 04/13/17  9:40 PM  Result Value Ref Range Status   Specimen Description BLOOD LEFT ANTECUBITAL  Final   Special Requests   Final     BOTTLES DRAWN AEROBIC AND ANAEROBIC Blood Culture adequate volume   Culture   Final    NO GROWTH 5 DAYS Performed at [facility], [address], [place], [place] [address]    Report Status 04/19/2017 FINAL  Final  Blood culture (routine x 2)     Status: Abnormal   Collection Time: 04/13/17  9:43 PM  Result Value Ref Range Status   Specimen Description BLOOD PORTA CATH  Final   Special Requests   Final    BOTTLES DRAWN AEROBIC AND ANAEROBIC Blood Culture adequate volume   Culture  Setup Time   Final    GRAM POSITIVE COCCI IN BOTH AEROBIC AND ANAEROBIC BOTTLES to JGrismsley(PharmD) by [facility] 04/14/17 AT 10:55PM    Culture (A)  Final    STAPHYLOCOCCUS SPECIES (COAGULASE NEGATIVE) THE SIGNIFICANCE OF ISOLATING THIS ORGANISM FROM A SINGLE SET OF BLOOD CULTURES WHEN MULTIPLE SETS ARE DRAWN IS UNCERTAIN. PLEASE NOTIFY THE MICROBIOLOGY DEPARTMENT WITHIN ONE WEEK IF SPECIATION AND SENSITIVITIES ARE REQUIRED. Performed at [facility], [place] [address], [place], [place] [address]    Report Status 04/16/2017 FINAL  Final  Blood Culture ID Panel (Reflexed)     Status: Abnormal   Collection Time: 04/13/17  9:43 PM  Result Value Ref Range Status   Enterococcus species NOT DETECTED NOT DETECTED Final   Listeria monocytogenes NOT DETECTED NOT DETECTED Final   Staphylococcus species DETECTED (A) NOT DETECTED Final    Comment: Methicillin (oxacillin) resistant coagulase negative staphylococcus. Possible blood culture contaminant (unless isolated from more than one blood culture draw or clinical case suggests pathogenicity). No antibiotic treatment is indicated for blood  culture contaminants. CRITICAL RESULT CALLED TO, READ BACK BY AND VERIFIED WITH: TO JGRISMELY(PharmD) by [email or web address] 04/14/17 at 10:55am    Staphylococcus aureus NOT DETECTED NOT DETECTED Final   Methicillin resistance DETECTED (A) NOT DETECTED Final    Comment: CRITICAL RESULT CALLED TO, READ BACK BY AND VERIFIED  WITH: TO JGRISMELY(PharmD) by [email or web address] 04/14/17 at 10:55am    Streptococcus species NOT DETECTED NOT DETECTED Final   Streptococcus agalactiae NOT DETECTED NOT DETECTED Final   Streptococcus pneumoniae NOT DETECTED NOT DETECTED Final   Streptococcus pyogenes NOT DETECTED NOT DETECTED Final   Acinetobacter baumannii NOT DETECTED NOT DETECTED Final   Enterobacteriaceae species NOT DETECTED NOT DETECTED Final   Enterobacter cloacae complex NOT DETECTED NOT DETECTED Final   Escherichia coli NOT DETECTED NOT DETECTED Final   Klebsiella oxytoca NOT DETECTED NOT DETECTED Final   Klebsiella pneumoniae NOT DETECTED NOT DETECTED Final   Proteus species NOT DETECTED NOT DETECTED Final   Serratia marcescens NOT DETECTED NOT DETECTED Final   Haemophilus influenzae NOT DETECTED NOT DETECTED Final   Neisseria meningitidis NOT DETECTED NOT DETECTED Final   Pseudomonas aeruginosa NOT DETECTED NOT DETECTED Final   Candida albicans NOT DETECTED NOT DETECTED Final  Candida glabrata NOT DETECTED NOT DETECTED Final   Candida krusei NOT DETECTED NOT DETECTED Final   Candida parapsilosis NOT DETECTED NOT DETECTED Final   Candida tropicalis NOT DETECTED NOT DETECTED Final    Comment: Performed at [facility], [place] [address] [place], [place] [address]  Respiratory Panel by PCR     Status: None   Collection Time: 04/14/17  2:30 PM  Result Value Ref Range Status   Adenovirus NOT DETECTED NOT DETECTED Final   Coronavirus 229E NOT DETECTED NOT DETECTED Final   Coronavirus HKU1 NOT DETECTED NOT DETECTED Final   Coronavirus NL63 NOT DETECTED NOT DETECTED Final   Coronavirus OC43 NOT DETECTED NOT DETECTED Final   Metapneumovirus NOT DETECTED NOT DETECTED Final   Rhinovirus / Enterovirus NOT DETECTED NOT DETECTED Final   Influenza A NOT DETECTED NOT DETECTED Final   Influenza B NOT DETECTED NOT DETECTED Final   Parainfluenza Virus 1 NOT DETECTED NOT DETECTED Final   Parainfluenza Virus 2 NOT DETECTED  NOT DETECTED Final   Parainfluenza Virus 3 NOT DETECTED NOT DETECTED Final   Parainfluenza Virus 4 NOT DETECTED NOT DETECTED Final   Respiratory Syncytial Virus NOT DETECTED NOT DETECTED Final   Bordetella pertussis NOT DETECTED NOT DETECTED Final   Chlamydophila pneumoniae NOT DETECTED NOT DETECTED Final   Mycoplasma pneumoniae NOT DETECTED NOT DETECTED Final    Comment: Performed at [facility], [place] [address] [place], [place] [address]  Culture, blood (Routine X 2) w Reflex to ID Panel     Status: None (Preliminary result)   Collection Time: 04/17/17 10:44 AM  Result Value Ref Range Status   Specimen Description BLOOD RIGHT ANTECUBITAL  Final   Special Requests   Final    BOTTLES DRAWN AEROBIC AND ANAEROBIC Blood Culture adequate volume   Culture   Final    NO GROWTH 3 DAYS Performed at [facility], [place] [address], [place], [place] [address]    Report Status PENDING  Incomplete  Culture, blood (Routine X 2) w Reflex to ID Panel     Status: None (Preliminary result)   Collection Time: 04/17/17 10:44 AM  Result Value Ref Range Status   Specimen Description BLOOD LEFT ANTECUBITAL  Final   Special Requests   Final    BOTTLES DRAWN AEROBIC AND ANAEROBIC Blood Culture adequate volume   Culture   Final    NO GROWTH 3 DAYS Performed at [facility], [place] [address] [place], [place] [address]    Report Status PENDING  Incomplete  Culture, blood (routine x 2)     Status: None (Preliminary result)   Collection Time: 04/19/17  6:58 PM  Result Value Ref Range Status   Specimen Description BLOOD RIGHT ANTECUBITAL  Final   Special Requests   Final    BOTTLES DRAWN AEROBIC ONLY Blood Culture adequate volume   Culture   Final    NO GROWTH < 24 HOURS Performed at [facility], [place] [address] [place],  [address]    Report Status PENDING  Incomplete  Culture, blood (routine x 2)     Status: None (Preliminary result)   Collection Time: 04/19/17  7:04 PM   Result Value Ref Range Status   Specimen Description BLOOD RIGHT ANTECUBITAL  Final   Special Requests   Final    BOTTLES DRAWN AEROBIC AND ANAEROBIC Blood Culture adequate volume   Culture   Final    NO GROWTH < 24 HOURS  Performed at [facility], [place] [address], [place], [place] [address]    Report Status PENDING  Incomplete  Culture, Urine     Status: None   Collection Time: 04/20/17  5:16 AM  Result Value Ref Range Status   Specimen Description URINE, RANDOM  Final   Special Requests Normal  Final   Culture   Final    NO GROWTH Performed at [facility], [place] [address] [place], [place] [address]    Report Status 04/21/2017 FINAL  Final     Labs: Basic Metabolic Panel: Recent Labs  Lab 04/15/17 1445 04/16/17 0333 04/17/17 0420 04/18/17 0952 04/19/17 1858 04/21/17 0407  NA 134* 135 134*  --  131* 133*  K 3.3* 3.2* 4.0  --  3.4* 3.6  CL 103 103 102  --  97* 101  CO2 24 25 28   --  24 26  GLUCOSE 210* 144* 130*  --  166* 116*  BUN 6 5* <5*  --  8 <5*  CREATININE 0.60 0.59 0.54 0.59 0.65 0.59  CALCIUM 7.8* 8.3* 8.6*  --  8.8* 8.2*   Liver Function Tests: Recent Labs  Lab 04/19/17 1858 04/21/17 0407  AST 23 26  ALT 19 19  ALKPHOS 173* 166*  BILITOT 0.4 0.4  PROT 6.6 6.1*  ALBUMIN 2.8* 2.7*   No results for input(s): LIPASE, AMYLASE in the last 168 hours. No results for input(s): AMMONIA in the last 168 hours. CBC: Recent Labs  Lab 04/15/17 1445 04/17/17 0420 04/19/17 1858 04/20/17 1429  WBC 14.8* 12.7* 9.5 11.2*  HGB 7.7* 8.1* 9.0* 8.5*  HCT 24.4* 25.7* 28.2* 27.6*  MCV 86.5 86.2 85.5 87.3  PLT 115* 108* 114* 127*   Cardiac Enzymes: Recent Labs  Lab 04/19/17 1858 04/20/17 0002 04/20/17 0652  TROPONINI <0.03 <0.03 <0.03   BNP: BNP (last 3 results) No results for input(s): BNP in the last 8760 hours.  ProBNP (last 3 results) No results for input(s): PROBNP in the last 8760 hours.  CBG: Recent Labs  Lab 04/14/17 2139  04/20/17 0803  GLUCAP 199* 140*       Signed:  Desiree Hane, MD [facility] 04/21/2017, 3:14 PM

## 2017-04-21 NOTE — Progress Notes (Signed)
Patient discharged home with husband. Prescriptions electronically sent to pharmacy- pt aware. Discharge paperwork given to patient & explained- verbalized understanding. VSS, pt in no distress. Pt d/c'd via W/C with assist from nursing staff.

## 2017-04-22 LAB — CULTURE, BLOOD (ROUTINE X 2)
CULTURE: NO GROWTH
Culture: NO GROWTH
SPECIAL REQUESTS: ADEQUATE
SPECIAL REQUESTS: ADEQUATE

## 2017-04-22 LAB — CANCER ANTIGEN 19-9: CA 19-9: 4381 U/mL — ABNORMAL HIGH (ref 0–35)

## 2017-04-24 ENCOUNTER — Observation Stay (HOSPITAL_COMMUNITY)
Admission: AD | Admit: 2017-04-24 | Discharge: 2017-04-26 | Disposition: A | Discharge status: Home / Self Care | Payer: 59 | Source: Ambulatory Visit | Attending: Internal Medicine | Admitting: Internal Medicine

## 2017-04-24 ENCOUNTER — Telehealth: Payer: Self-pay | Admitting: Oncology

## 2017-04-24 ENCOUNTER — Inpatient Hospital Stay (HOSPITAL_BASED_OUTPATIENT_CLINIC_OR_DEPARTMENT_OTHER): Payer: 59 | Admitting: Oncology

## 2017-04-24 ENCOUNTER — Inpatient Hospital Stay: Payer: 59

## 2017-04-24 ENCOUNTER — Ambulatory Visit: Payer: 59

## 2017-04-24 ENCOUNTER — Encounter (HOSPITAL_COMMUNITY): Payer: Self-pay | Admitting: *Deleted

## 2017-04-24 ENCOUNTER — Other Ambulatory Visit: Payer: Self-pay

## 2017-04-24 ENCOUNTER — Inpatient Hospital Stay (HOSPITAL_COMMUNITY): Payer: 59

## 2017-04-24 VITALS — BP 115/57 | HR 98 | Temp 99.1°F | Resp 17

## 2017-04-24 VITALS — BP 102/49 | HR 107 | Temp 97.8°F | Resp 17 | Ht 63.0 in

## 2017-04-24 DIAGNOSIS — C259 Malignant neoplasm of pancreas, unspecified: Secondary | ICD-10-CM

## 2017-04-24 DIAGNOSIS — R42 Dizziness and giddiness: Secondary | ICD-10-CM | POA: Diagnosis not present

## 2017-04-24 DIAGNOSIS — R531 Weakness: Secondary | ICD-10-CM

## 2017-04-24 DIAGNOSIS — E119 Type 2 diabetes mellitus without complications: Secondary | ICD-10-CM | POA: Diagnosis not present

## 2017-04-24 DIAGNOSIS — Z91030 Bee allergy status: Secondary | ICD-10-CM | POA: Diagnosis not present

## 2017-04-24 DIAGNOSIS — Z9989 Dependence on other enabling machines and devices: Secondary | ICD-10-CM | POA: Insufficient documentation

## 2017-04-24 DIAGNOSIS — Z7982 Long term (current) use of aspirin: Secondary | ICD-10-CM | POA: Insufficient documentation

## 2017-04-24 DIAGNOSIS — C787 Secondary malignant neoplasm of liver and intrahepatic bile duct: Secondary | ICD-10-CM | POA: Diagnosis not present

## 2017-04-24 DIAGNOSIS — R110 Nausea: Secondary | ICD-10-CM | POA: Diagnosis not present

## 2017-04-24 DIAGNOSIS — E1122 Type 2 diabetes mellitus with diabetic chronic kidney disease: Secondary | ICD-10-CM | POA: Diagnosis not present

## 2017-04-24 DIAGNOSIS — G8929 Other chronic pain: Secondary | ICD-10-CM

## 2017-04-24 DIAGNOSIS — M545 Low back pain: Secondary | ICD-10-CM | POA: Diagnosis not present

## 2017-04-24 DIAGNOSIS — I959 Hypotension, unspecified: Secondary | ICD-10-CM | POA: Diagnosis not present

## 2017-04-24 DIAGNOSIS — F329 Major depressive disorder, single episode, unspecified: Secondary | ICD-10-CM | POA: Diagnosis not present

## 2017-04-24 DIAGNOSIS — C251 Malignant neoplasm of body of pancreas: Secondary | ICD-10-CM | POA: Insufficient documentation

## 2017-04-24 DIAGNOSIS — R6883 Chills (without fever): Secondary | ICD-10-CM

## 2017-04-24 DIAGNOSIS — Z7901 Long term (current) use of anticoagulants: Secondary | ICD-10-CM

## 2017-04-24 DIAGNOSIS — Z6841 Body Mass Index (BMI) 40.0 and over, adult: Secondary | ICD-10-CM | POA: Diagnosis not present

## 2017-04-24 DIAGNOSIS — Z79899 Other long term (current) drug therapy: Secondary | ICD-10-CM | POA: Insufficient documentation

## 2017-04-24 DIAGNOSIS — R21 Rash and other nonspecific skin eruption: Secondary | ICD-10-CM

## 2017-04-24 DIAGNOSIS — Z86718 Personal history of other venous thrombosis and embolism: Secondary | ICD-10-CM | POA: Diagnosis not present

## 2017-04-24 DIAGNOSIS — R61 Generalized hyperhidrosis: Secondary | ICD-10-CM

## 2017-04-24 DIAGNOSIS — R0600 Dyspnea, unspecified: Secondary | ICD-10-CM | POA: Diagnosis present

## 2017-04-24 DIAGNOSIS — Z9221 Personal history of antineoplastic chemotherapy: Secondary | ICD-10-CM | POA: Insufficient documentation

## 2017-04-24 DIAGNOSIS — R0602 Shortness of breath: Secondary | ICD-10-CM | POA: Insufficient documentation

## 2017-04-24 DIAGNOSIS — K5900 Constipation, unspecified: Secondary | ICD-10-CM

## 2017-04-24 DIAGNOSIS — G893 Neoplasm related pain (acute) (chronic): Secondary | ICD-10-CM | POA: Diagnosis not present

## 2017-04-24 DIAGNOSIS — E669 Obesity, unspecified: Secondary | ICD-10-CM | POA: Insufficient documentation

## 2017-04-24 DIAGNOSIS — Z95828 Presence of other vascular implants and grafts: Secondary | ICD-10-CM

## 2017-04-24 DIAGNOSIS — Z5111 Encounter for antineoplastic chemotherapy: Secondary | ICD-10-CM | POA: Diagnosis not present

## 2017-04-24 DIAGNOSIS — G4730 Sleep apnea, unspecified: Secondary | ICD-10-CM | POA: Diagnosis not present

## 2017-04-24 DIAGNOSIS — R5381 Other malaise: Secondary | ICD-10-CM | POA: Insufficient documentation

## 2017-04-24 DIAGNOSIS — Z882 Allergy status to sulfonamides status: Secondary | ICD-10-CM | POA: Insufficient documentation

## 2017-04-24 DIAGNOSIS — F419 Anxiety disorder, unspecified: Secondary | ICD-10-CM | POA: Diagnosis not present

## 2017-04-24 DIAGNOSIS — G4733 Obstructive sleep apnea (adult) (pediatric): Secondary | ICD-10-CM | POA: Diagnosis present

## 2017-04-24 DIAGNOSIS — Z888 Allergy status to other drugs, medicaments and biological substances status: Secondary | ICD-10-CM | POA: Insufficient documentation

## 2017-04-24 DIAGNOSIS — Z886 Allergy status to analgesic agent status: Secondary | ICD-10-CM | POA: Insufficient documentation

## 2017-04-24 DIAGNOSIS — I10 Essential (primary) hypertension: Secondary | ICD-10-CM | POA: Diagnosis not present

## 2017-04-24 DIAGNOSIS — G43909 Migraine, unspecified, not intractable, without status migrainosus: Secondary | ICD-10-CM | POA: Diagnosis not present

## 2017-04-24 DIAGNOSIS — Z87440 Personal history of urinary (tract) infections: Secondary | ICD-10-CM | POA: Diagnosis not present

## 2017-04-24 DIAGNOSIS — Z7689 Persons encountering health services in other specified circumstances: Secondary | ICD-10-CM | POA: Diagnosis not present

## 2017-04-24 LAB — CULTURE, BLOOD (ROUTINE X 2)
CULTURE: NO GROWTH
CULTURE: NO GROWTH
SPECIAL REQUESTS: ADEQUATE
SPECIAL REQUESTS: ADEQUATE

## 2017-04-24 LAB — CMP (CANCER CENTER ONLY)
ALBUMIN: 2.8 g/dL — AB (ref 3.5–5.0)
ALT: 20 U/L (ref 0–55)
ANION GAP: 13 — AB (ref 3–11)
AST: 24 U/L (ref 5–34)
Alkaline Phosphatase: 218 U/L — ABNORMAL HIGH (ref 40–150)
BUN: 6 mg/dL — ABNORMAL LOW (ref 7–26)
CO2: 23 mmol/L (ref 22–29)
Calcium: 10 mg/dL (ref 8.4–10.4)
Chloride: 99 mmol/L (ref 98–109)
Creatinine: 0.69 mg/dL (ref 0.60–1.10)
GFR, Est AFR Am: >60 mL/min (ref >60)
GFR, Estimated: >60 mL/min (ref >60)
GLUCOSE: 139 mg/dL (ref 70–140)
POTASSIUM: 4.2 mmol/L (ref 3.3–4.7)
SODIUM: 135 mmol/L — AB (ref 136–145)
TOTAL PROTEIN: 7.1 g/dL (ref 6.4–8.3)
Total Bilirubin: 0.8 mg/dL (ref 0.2–1.2)

## 2017-04-24 LAB — CBC WITH DIFFERENTIAL (CANCER CENTER ONLY)
BASOS PCT: 0 %
Basophils Absolute: 0 10*3/uL (ref 0.0–0.1)
EOS ABS: 0.4 10*3/uL (ref 0.0–0.5)
Eosinophils Relative: 4 %
HCT: 34.1 % — ABNORMAL LOW (ref 34.8–46.6)
Hemoglobin: 10.6 g/dL — ABNORMAL LOW (ref 11.6–15.9)
Lymphocytes Relative: 11 %
Lymphs Abs: 1.1 10*3/uL (ref 0.9–3.3)
MCH: 26.8 pg (ref 25.1–34.0)
MCHC: 31.1 g/dL — AB (ref 31.5–36.0)
MCV: 86.1 fL (ref 79.5–101.0)
MONOS PCT: 13 %
Monocytes Absolute: 1.4 10*3/uL — ABNORMAL HIGH (ref 0.1–0.9)
Neutro Abs: 7.4 10*3/uL — ABNORMAL HIGH (ref 1.5–6.5)
Neutrophils Relative %: 72 %
PLATELETS: 184 10*3/uL (ref 145–400)
RBC: 3.96 MIL/uL (ref 3.70–5.45)
RDW: 19.3 % — ABNORMAL HIGH (ref 11.2–16.1)
WBC Count: 10.3 10*3/uL (ref 3.9–10.3)

## 2017-04-24 MED ORDER — HYDROCODONE-ACETAMINOPHEN 5-325 MG PO TABS
1.0000 | ORAL_TABLET | Freq: Four times a day (QID) | ORAL | Status: DC | PRN
  Administered 2017-04-24 – 2017-04-26 (×3): 1 via ORAL
  Filled 2017-04-24 (×3): qty 1

## 2017-04-24 MED ORDER — CITALOPRAM HYDROBROMIDE 20 MG PO TABS
20.0000 mg | ORAL_TABLET | Freq: Every day | ORAL | Status: DC
  Administered 2017-04-25 – 2017-04-26 (×2): 20 mg via ORAL
  Filled 2017-04-24 (×2): qty 1

## 2017-04-24 MED ORDER — HEPARIN SOD (PORK) LOCK FLUSH 100 UNIT/ML IV SOLN
500.0000 [IU] | Freq: Once | INTRAVENOUS | Status: AC | PRN
  Administered 2017-04-24: 500 [IU] via INTRAVENOUS
  Filled 2017-04-24: qty 5

## 2017-04-24 MED ORDER — HYDROMORPHONE HCL 2 MG PO TABS
4.0000 mg | ORAL_TABLET | ORAL | Status: DC | PRN
  Administered 2017-04-24 – 2017-04-26 (×2): 4 mg via ORAL
  Filled 2017-04-24 (×2): qty 2

## 2017-04-24 MED ORDER — PANTOPRAZOLE SODIUM 40 MG PO TBEC
40.0000 mg | DELAYED_RELEASE_TABLET | Freq: Every day | ORAL | Status: DC
  Administered 2017-04-25 – 2017-04-26 (×2): 40 mg via ORAL
  Filled 2017-04-24 (×2): qty 1

## 2017-04-24 MED ORDER — CYCLOBENZAPRINE HCL 5 MG PO TABS: 5.0000 mg | ORAL_TABLET | Freq: Three times a day (TID) | ORAL | Status: DC | PRN

## 2017-04-24 MED ORDER — HYDROXYZINE HCL 10 MG PO TABS: 10.0000 mg | ORAL_TABLET | Freq: Three times a day (TID) | ORAL | Status: DC | PRN

## 2017-04-24 MED ORDER — OXYBUTYNIN CHLORIDE ER 5 MG PO TB24
10.0000 mg | ORAL_TABLET | Freq: Every morning | ORAL | Status: DC
  Administered 2017-04-25 – 2017-04-26 (×2): 10 mg via ORAL
  Filled 2017-04-24 (×2): qty 2

## 2017-04-24 MED ORDER — TRAZODONE HCL 50 MG PO TABS
50.0000 mg | ORAL_TABLET | Freq: Every day | ORAL | Status: DC
  Administered 2017-04-24 – 2017-04-25 (×2): 50 mg via ORAL
  Filled 2017-04-24 (×2): qty 1

## 2017-04-24 MED ORDER — SODIUM CHLORIDE 0.9% FLUSH
10.0000 mL | INTRAVENOUS | Status: DC | PRN
  Administered 2017-04-24: 10 mL via INTRAVENOUS
  Filled 2017-04-24: qty 10

## 2017-04-24 MED ORDER — LORAZEPAM 1 MG PO TABS: 1.0000 mg | ORAL_TABLET | Freq: Three times a day (TID) | ORAL | Status: DC | PRN

## 2017-04-24 MED ORDER — SODIUM CHLORIDE 0.9 % IV SOLN
Freq: Once | INTRAVENOUS | Status: AC
  Administered 2017-04-24: 09:00:00 via INTRAVENOUS

## 2017-04-24 MED ORDER — RIVAROXABAN 20 MG PO TABS
20.0000 mg | ORAL_TABLET | Freq: Every day | ORAL | Status: DC
  Administered 2017-04-24 – 2017-04-25 (×2): 20 mg via ORAL
  Filled 2017-04-24 (×2): qty 1

## 2017-04-24 MED ORDER — MORPHINE SULFATE ER 30 MG PO TBCR
30.0000 mg | EXTENDED_RELEASE_TABLET | Freq: Two times a day (BID) | ORAL | Status: DC
  Administered 2017-04-24 – 2017-04-26 (×4): 30 mg via ORAL
  Filled 2017-04-24 (×4): qty 1

## 2017-04-24 MED ORDER — MIDODRINE HCL 5 MG PO TABS
5.0000 mg | ORAL_TABLET | Freq: Three times a day (TID) | ORAL | Status: DC
  Administered 2017-04-25 – 2017-04-26 (×4): 5 mg via ORAL
  Filled 2017-04-24 (×5): qty 1

## 2017-04-24 MED ORDER — SODIUM CHLORIDE 0.9 % IV SOLN
INTRAVENOUS | Status: DC
  Administered 2017-04-24: 14:00:00 via INTRAVENOUS

## 2017-04-24 MED ORDER — INSULIN DETEMIR 100 UNIT/ML ~~LOC~~ SOLN
10.0000 [IU] | Freq: Every day | SUBCUTANEOUS | Status: DC
  Administered 2017-04-24 – 2017-04-25 (×2): 10 [IU] via SUBCUTANEOUS
  Filled 2017-04-24 (×2): qty 0.1

## 2017-04-24 MED ORDER — PREGABALIN 75 MG PO CAPS
200.0000 mg | ORAL_CAPSULE | Freq: Every day | ORAL | Status: DC
  Administered 2017-04-25 – 2017-04-26 (×2): 200 mg via ORAL
  Filled 2017-04-24 (×2): qty 1

## 2017-04-24 NOTE — Progress Notes (Signed)
Upon arrival to infusion, patient is diaphoretic and c/o feeling "miserable." She is moaning. She has just finished her appointment with Dr. Benay Spice and is here to receive IVF. She is not to receive chemo today. Orders for blood cultures and urine culture given. Will continue to monitor patient and collect necessary lab work.  1000-VSS. Patient doesn't complain of any pain.  1315-Per Dr. Benay Spice, we will admit the patient to the hospital due to symptoms being unresolved after IVF. We will move her from a chair to a bed for her comfort and request. Report given to Birdena Crandall, RN in infusion.

## 2017-04-24 NOTE — Progress Notes (Addendum)
Pt. has brought in home unit for use, humidifier was not available, was given Dreamstation unit for use during admission.

## 2017-04-24 NOTE — Patient Instructions (Signed)
Dehydration, Adult Dehydration is when there is not enough fluid or water in your body. This happens when you lose more fluids than you take in. Dehydration can range from mild to very bad. It should be treated right away to keep it from getting very bad. Symptoms of mild dehydration may include:  Thirst.  Dry lips.  Slightly dry mouth.  Dry, warm skin.  Dizziness. Symptoms of moderate dehydration may include:  Very dry mouth.  Muscle cramps.  Dark pee (urine). Pee may be the color of tea.  Your body making less pee.  Your eyes making fewer tears.  Heartbeat that is uneven or faster than normal (palpitations).  Headache.  Light-headedness, especially when you stand up from sitting.  Fainting (syncope). Symptoms of very bad dehydration may include:  Changes in skin, such as: ? Cold and clammy skin. ? Blotchy (mottled) or pale skin. ? Skin that does not quickly return to normal after being lightly pinched and let go (poor skin turgor).  Changes in body fluids, such as: ? Feeling very thirsty. ? Your eyes making fewer tears. ? Not sweating when body temperature is high, such as in hot weather. ? Your body making very little pee.  Changes in vital signs, such as: ? Weak pulse. ? Pulse that is more than 100 beats a minute when you are sitting still. ? Fast breathing. ? Low blood pressure.  Other changes, such as: ? Sunken eyes. ? Cold hands and feet. ? Confusion. ? Lack of energy (lethargy). ? Trouble waking up from sleep. ? Short-term weight loss. ? Unconsciousness. Follow these instructions at home:  If told by your doctor, drink an ORS: ? Make an ORS by using instructions on the package. ? Start by drinking small amounts, about  cup (120 mL) every 5-10 minutes. ? Slowly drink more until you have had the amount that your doctor said to have.  Drink enough clear fluid to keep your pee clear or pale yellow. If you were told to drink an ORS, finish the ORS  first, then start slowly drinking clear fluids. Drink fluids such as: ? Water. Do not drink only water by itself. Doing that can make the salt (sodium) level in your body get too low (hyponatremia). ? Ice chips. ? Fruit juice that you have added water to (diluted). ? Low-calorie sports drinks.  Avoid: ? Alcohol. ? Drinks that have a lot of sugar. These include high-calorie sports drinks, fruit juice that does not have water added, and soda. ? Caffeine. ? Foods that are greasy or have a lot of fat or sugar.  Take over-the-counter and prescription medicines only as told by your doctor.  Do not take salt tablets. Doing that can make the salt level in your body get too high (hypernatremia).  Eat foods that have minerals (electrolytes). Examples include bananas, oranges, potatoes, tomatoes, and spinach.  Keep all follow-up visits as told by your doctor. This is important. Contact a doctor if:  You have belly (abdominal) pain that: ? Gets worse. ? Stays in one area (localizes).  You have a rash.  You have a stiff neck.  You get angry or annoyed more easily than normal (irritability).  You are more sleepy than normal.  You have a harder time waking up than normal.  You feel: ? Weak. ? Dizzy. ? Very thirsty.  You have peed (urinated) only a small amount of very dark pee during 6-8 hours. Get help right away if:  You have symptoms of   very bad dehydration.  You cannot drink fluids without throwing up (vomiting).  Your symptoms get worse with treatment.  You have a fever.  You have a very bad headache.  You are throwing up or having watery poop (diarrhea) and it: ? Gets worse. ? Does not go away.  You have blood or something green (bile) in your throw-up.  You have blood in your poop (stool). This may cause poop to look black and tarry.  You have not peed in 6-8 hours.  You pass out (faint).  Your heart rate when you are sitting still is more than 100 beats a  minute.  You have trouble breathing. This information is not intended to replace advice given to you by your health care provider. Make sure you discuss any questions you have with your health care provider. Document Released: 01/07/2009 Document Revised: 10/01/2015 Document Reviewed: 05/07/2015 Elsevier Interactive Patient Education  2018 [employer]  

## 2017-04-24 NOTE — Progress Notes (Signed)
1605: Report called to Jarrett Soho, RN on [address] who verbalized understanding and had no further questions regarding patient care.  1630: Pt taken via wheelchair to room 1443 by this RN with IVF running to patent port without difficulty. Pt husband with patient. Pt respirations equal and unlabored. Skin warm and dry. Vitals as charted. Pt transferred to inpatient bed without difficulty and care transferred.

## 2017-04-24 NOTE — Telephone Encounter (Signed)
No 1/29 los °

## 2017-04-24 NOTE — H&P (Signed)
History and Physical  Heather Frederick NGE:[id number] DOB: 12-28-1955 DOA: 04/24/2017  PCP: [name], MD  Patient coming from: [place]   Chief Complaint: chills  HPI:  [id number] PMH metastatic pancreatic cancer, direct admit from [place] for recurrent diaphoresis, hypotension, generalized weakness, dizziness.  Patient felt ok 1/28. This AM felt poorly, no appetite, didn't eat anything. Went for scheduled appt with Dr. Benay Spice. There had chills, diaphoresis, generalized weakness, shortness of breath, nausea and malaise without specific aggravating or alleviating factors. Third hospitalization now in 11 days and symptoms are getting worse. She correlates symptoms with chemo administration though this and the last episode do not correlate.   Admission 1/18-1/23 with SIRS, diaphoresis and tachycardia-resolved, 1 blood culture positive for a coagulase-negative Staphylococcus  Admission 1/24-1/26 with recurrent hypotension, thought orthostatic. Negative workup. Seen by cardiology for tachycardia and hypotension and impression was chemotherapy-related side effects. Seen by pulmonology for hypotension, diaphoresis, SOB. Recommended cortisol check and consider low-dose midodrine.  Review of Systems:  Negative for fever, new visual changes, sore throat, rash, new muscle aches, chest pain, dysuria, bleeding, v/abdominal pain.  Past Medical History:  Diagnosis Date  . Allergy   . Anxiety   . Arthritis   . Benign essential HTN 09/23/2014  . Cancer ([place])   . Chronic kidney disease    uti  . Depression   . Dyslipidemia   . Elevated liver enzymes   . Family history of anesthesia complication    father has a severe hard time waking up  . Gallstones    a. Seen on CT 01/2014.  Marland Kitchen GERD (gastroesophageal reflux disease)   . Hard of hearing   . Hepatic steatosis   . History of frequent urinary tract infections   . Hyperlipidemia   . Hypertension   . Lateral epicondylitis of right elbow     . Mental disorder   . Meralgia paresthetica of right side 10/02/2012   slight at 05/2014  . Migraine headache   . Obesity   . OSA (obstructive sleep apnea)    severe with AHI 37/hr now on CPAP at 18cm H2O  . Osteoarthritis   . Pancreatic cancer metastasized to liver ([place])   . Pneumonia    Feb 2018  . Raynaud disease    in feet per patient   . Sepsis ([place]) 02/23/2017  . Sleep apnea    wears c-pap  . Urinary tract infection     Past Surgical History:  Procedure Laterality Date  . ABDOMINAL HYSTERECTOMY  1/04   partial  . BLADDER SUSPENSION  6/10  . CARDIAC CATHETERIZATION    . carpel tunnel left/right  7/08, 8/08 Bilateral 8/08 and 7/08  . carpel tunnel rel Right 4/12  . CHOLECYSTECTOMY N/A 06/11/2014   Procedure: LAPAROSCOPIC CHOLECYSTECTOMY WITH ATTEMPTED INTRAOPERATIVE CHOLANGIOGRAM;  Surgeon: Jackolyn Confer, MD;  Location: [facility];  Service: General;  Laterality: N/A;  . COLONOSCOPY    . ERCP N/A 10/19/2014   Procedure: ENDOSCOPIC RETROGRADE CHOLANGIOPANCREATOGRAPHY (ERCP);  Surgeon: Ladene Artist, MD;  Location: [facility];  Service: Endoscopy;  Laterality: N/A;  . INCISIONAL HERNIA REPAIR N/A 06/30/2016   Procedure: LAPAROSCOPIC REPAIR OF INCISIONAL HERNIA WITH MESH;  Surgeon: Jackolyn Confer, MD;  Location: [facility];  Service: General;  Laterality: N/A;  . INSERTION OF MESH N/A 06/30/2016   Procedure: INSERTION OF MESH;  Surgeon: Jackolyn Confer, MD;  Location: [facility];  Service: General;  Laterality: N/A;  . IR CV LINE INJECTION  11/24/2016  . IR FLUORO  GUIDE PORT INSERTION RIGHT  08/28/2016  . IR US GUIDE VASC ACCESS RIGHT  08/28/2016  . JOINT REPLACEMENT    . KNEE ARTHROSCOPY Left 12/12  . KNEE ARTHROSCOPY Right 12/06  . LEFT HEART CATHETERIZATION WITH CORONARY ANGIOGRAM N/A 03/10/2014   Procedure: LEFT HEART CATHETERIZATION WITH CORONARY ANGIOGRAM;  Surgeon: Sinclair Grooms, MD;  Location: [facility];  Service: Cardiovascular;  Laterality: N/A;  . PLANTAR FASCIA RELEASE  Right 12/10  . radial tunnel release     right arm   . ROTATOR CUFF REPAIR Left 6/11  . tennis elbow release Right 7/04  . TOTAL KNEE ARTHROPLASTY Left 09/10/2012   Procedure: TOTAL KNEE ARTHROPLASTY- left;  Surgeon: Garald Balding, MD;  Location: [place];  Service: Orthopedics;  Laterality: Left;  Left total knee arthroplasty     reports that  has never smoked. she has never used smokeless tobacco. She reports that she drinks alcohol. She reports that she does not use drugs. Mobility: ambulatory  Allergies  Allergen Reactions  . Topamax [Topiramate] Other (See Comments)    Stroke like symptoms  . Aleve [Naproxen Sodium] Hives    Has tolerated Voltaren topical as well as aspirin.  . Bee Venom Swelling  . Echinacea Hives  . Other Other (See Comments)    Feathers cause sinus congestion  . Sulfa Antibiotics Hives  . Advil [Ibuprofen] Hives    Has tolerated Voltaren topical as well as aspirin.    Family History  Problem Relation Age of Onset  . Hypertension Mother   . Stroke Mother   . Liver disease Mother        Abcess  . Hypertension Father   . Coronary artery disease Father   . Migraines Father   . Clotting disorder Father   . Kidney failure Brother   . Hypertension Brother   . Migraines Brother   . Migraines Daughter   . Breast cancer Other        Niece with breast cancer  . Colon cancer Paternal Grandmother   . Pancreatic cancer Paternal Grandmother   . Stomach cancer Paternal Grandmother   . Breast cancer Cousin   . Esophageal cancer Neg Hx   . Rectal cancer Neg Hx      Prior to Admission medications   Medication Sig Start Date End Date Taking? Authorizing Provider  aspirin EC 81 MG tablet Take 81 mg by mouth daily.    [provider]  citalopram (CELEXA) 20 MG tablet TAKE ONE (1) TABLET BY MOUTH EVERY DAY 01/22/17   Hoyt Koch, MD  Cranberry-Vitamin C-Vitamin E (CRANBERRY PLUS VITAMIN C PO) Take 1 tablet by mouth daily. 4200 mg     [provider]  cyclobenzaprine (FLEXERIL) 5 MG tablet TAKE ONE (1) TABLET BY MOUTH 3 TIMES DAILY AS NEEDED FOR MUSCLE SPASMS 04/19/17   Ladell Pier, MD  eletriptan (RELPAX) 40 MG tablet Take 1 tablet (40 mg total) by mouth every 2 (two) hours as needed for migraine. 06/26/16   Dennie Bible, NP  HYDROcodone-acetaminophen (NORCO/VICODIN) 5-325 MG tablet Take 1 tablet by mouth every 6 (six) hours as needed for moderate pain.    [provider]  HYDROmorphone (DILAUDID) 4 MG tablet Take 0.5 tablets (2 mg total) by mouth every 4 (four) hours as needed for severe pain. Patient taking differently: Take 4-8 mg by mouth every 4 (four) hours as needed for severe pain.  03/14/17   Charlynne Cousins, MD  hydrOXYzine (ATARAX/VISTARIL)  10 MG tablet Take 10 mg by mouth every 8 (eight) hours as needed for itching.    [provider]  insulin detemir (LEVEMIR) 100 unit/ml SOLN Inject 10 Units into the skin at bedtime.    [provider]  insulin lispro (HUMALOG) 100 UNIT/ML injection Inject 2-10 Units into the skin. Sliding Scale    [provider]  lidocaine-prilocaine (EMLA) cream APPLY 1 APPLICATION TOPICALLY AS NEEDED.APPLY TO PORT A CATH SITE 1 HOUR PRIOR TO NEEDLE STICK. 03/26/17   Ladell Pier, MD  LORazepam (ATIVAN) 1 MG tablet Take 1 tablet (1 mg total) by mouth every 8 (eight) hours as needed for anxiety or sedation (or nausea). 04/18/17   Oswald Hillock, MD  LYRICA 200 MG capsule Take 200 mg by mouth daily. 04/03/17   [provider]  midodrine (PROAMATINE) 5 MG tablet Take 1 tablet (5 mg total) by mouth 3 (three) times daily with meals. 04/21/17   Oretha Milch D, MD  morphine (MS CONTIN) 30 MG 12 hr tablet Take 30 mg by mouth every 12 (twelve) hours. 03/20/17   [provider]  multivitamin-lutein (OCUVITE-LUTEIN) CAPS capsule Take 1 capsule by mouth daily.    [provider]  nitroGLYCERIN (NITROSTAT) 0.4 MG SL tablet  Place 1 tablet (0.4 mg total) under the tongue every 5 (five) minutes as needed for chest pain. 06/28/15   Belva Crome, MD  ondansetron (ZOFRAN-ODT) 8 MG disintegrating tablet Take 1 tablet (8 mg total) by mouth every 8 (eight) hours as needed for nausea or vomiting. 09/18/16   Maryanna Shape, NP  oxybutynin (DITROPAN-XL) 10 MG 24 hr tablet Take 1 tablet (10 mg total) by mouth every morning. 03/19/17   Ladell Pier, MD  oxyCODONE (OXY IR/ROXICODONE) 5 MG immediate release tablet TAKE 1 TABLET BY MOUTH EVERY 4 HOURS AS NEEDED FOR SEVERE PAIN 04/03/17   Owens Shark, NP  pantoprazole (PROTONIX) 40 MG tablet TAKE ONE (1) TABLET BY MOUTH TWO (2) TIMES DAILY 09/25/16   Ladene Artist, MD  prochlorperazine (COMPAZINE) 10 MG tablet Take 10 mg by mouth every 6 (six) hours as needed for nausea or vomiting.    [provider]  simethicone (MYLICON) 062 MG chewable tablet Chew 125-250 mg by mouth 2 (two) times daily as needed for flatulence.    [provider]  vitamin B-12 (CYANOCOBALAMIN) 1000 MCG tablet Take 2,500 mcg by mouth every morning.    [provider]  XARELTO 20 MG TABS tablet Take 20 mg daily by mouth. 01/31/17   [provider]    Physical Exam:  Constitutional:   . Appears calm, uncomfortable, but not toxic Eyes:  . pupils and irises appear normal . Normal lids ENMT:  . Hard of hearing  . Lips appear normal Neck:  . neck appears normal, no masses . no thyromegaly Respiratory:  . CTA bilaterally, no w/r/r.  . Respiratory effort normal Cardiovascular:  . RRR, no m/r/g . No LE extremity edema   Abdomen:  . Obese, soft  Musculoskeletal:  . RUE, LUE, RLE, LLE   o strength and tone normal, no atrophy, no abnormal movements o No tenderness, masses Skin:  . No rashes, lesions, ulcers . palpation of skin: no induration or nodules Neurologic:  . Grossly normal Psychiatric:  . Mental status o Mood, affect appropriate . judgement and  insight appear normal     Wt Readings from Last 3 Encounters:  04/24/17 109.2 kg (240 lb 11.9 oz)  04/21/17 115.8 kg (255 lb 3.2 oz)  04/14/17 115.6 kg (254 lb 13.6 oz)    I have personally reviewed following labs and imaging studies  Labs:   CMP unremarkable. Chronic elevation of AP  Hgb 10.6 better than 1/26  Plts now normal 184  Cortisol normal 1/26  BC pending  Imaging studies:   CXR pending    Principal Problem:   Diaphoresis Active Problems:   OSA (obstructive sleep apnea)   Pancreatic carcinoma metastatic to liver [facility])   Chronic anticoagulation   Dyspnea   Chronic pain   Generalized weakness   Assessment/Plan Diaphoresis, chills, malaise, generalized weakness, nausea. Etiology unclear. Last chemo 1/16. Labs unrevealing. No focal symptoms. Previous culture data from last 2 admissions unrevealing (1 spurious blood culture). No urinary symptoms. No definite pulmonary symptoms. Skin appears unremarkable. Orthostatic hypotension was postulated previously and patient is on midodrine. - etiology unclear. Differential would include occult bacteremia/infection (consider port), tumor fever, chemotherapy adverse reaction. Symptoms do not seem to fit orthostasis. - Non-toxic. BC pending. Will check u/a, CXR, echo and f/u culture data. No abx at this point.  Metastatic pancreatic cancer - per oncology chemotherapy on hold  Chronic pain secondary to malignancy - continue chronic pain medications  OSA - CPAP   Severity of Illness: The appropriate patient status for this patient is OBSERVATION. Observation status is judged to be reasonable and necessary in order to provide the required intensity of service to ensure the patient's safety. The patient's presenting symptoms, physical exam findings, and initial radiographic and laboratory data in the context of their medical condition is felt to place them at decreased risk for further clinical deterioration. Furthermore,  it is anticipated that the patient will be medically stable for discharge from the hospital within 2 midnights of admission. The following factors support the patient status of observation.   DVT prophylaxis: Xarelto Code Status: full Family Communication: none   Time spent: 60 minutes  Murray Hodgkins, MD  [facility] Direct contact: [phone] --[place] [place]  --[email or web address]; password [email or web address]  7PM-7AM contact night coverage as above  04/24/2017, 5:31 PM

## 2017-04-24 NOTE — Progress Notes (Signed)
[place] OFFICE PROGRESS NOTE   Diagnosis: Pancreas cancer  INTERVAL HISTORY:   Ms. Cieslewicz returns for a scheduled visit.  She was discharged from the hospital 04/21/2017 after admission with hypotension and diaphoresis.  No etiology for her symptoms was found. She reports recurrent diaphoresis and dizziness beginning 04/22/2017.  The symptoms have progressed over the past 2 days.  No fever.  Her blood sugar has not been low. She continues to have pain at the right upper abdomen and right lateral chest.  Objective:  Vital signs in last 24 hours:  Blood pressure (!) 102/49, pulse (!) 107, temperature 97.8 F (36.6 C), temperature source Oral, resp. rate 17, height '5\' 3"'  (1.6 m), SpO2 99 %.    HEENT: No thrush Resp: Lungs clear bilaterally Cardio: Regular rate and rhythm GI: The abdomen is soft and nontender Vascular: No leg edema Neuro: Alert, follows commands, she became weak when attempting to ambulate to the examination table and had to sit down   Portacath/PICC-without erythema  Lab Results:  Lab Results  Component Value Date   WBC 10.3 04/24/2017   HGB 8.5 (L) 04/20/2017   HCT 34.1 (L) 04/24/2017   MCV 86.1 04/24/2017   PLT 184 04/24/2017   NEUTROABS 7.4 (H) 04/24/2017    CMP     Component Value Date/Time   NA 133 (L) 04/21/2017 0407   NA 137 03/19/2017 1422   K 3.6 04/21/2017 0407   K 3.4 (L) 03/19/2017 1422   CL 101 04/21/2017 0407   CO2 26 04/21/2017 0407   CO2 24 03/19/2017 1422   GLUCOSE 116 (H) 04/21/2017 0407   GLUCOSE 158 (H) 03/19/2017 1422   BUN <5 (L) 04/21/2017 0407   BUN 6.2 (L) 03/19/2017 1422   CREATININE 0.59 04/21/2017 0407   CREATININE 0.8 03/19/2017 1422   CALCIUM 8.2 (L) 04/21/2017 0407   CALCIUM 8.8 03/19/2017 1422   PROT 6.1 (L) 04/21/2017 0407   PROT 6.6 03/19/2017 1422   ALBUMIN 2.7 (L) 04/21/2017 0407   ALBUMIN 2.7 (L) 03/19/2017 1422   AST 26 04/21/2017 0407   AST 27 04/13/2017 1219   AST 24 03/19/2017 1422    ALT 19 04/21/2017 0407   ALT 29 04/13/2017 1219   ALT 20 03/19/2017 1422   ALKPHOS 166 (H) 04/21/2017 0407   ALKPHOS 150 03/19/2017 1422   BILITOT 0.4 04/21/2017 0407   BILITOT 1.4 (H) 04/13/2017 1219   BILITOT 0.42 03/19/2017 1422   GFRNONAA >60 04/21/2017 0407   GFRNONAA >60 04/13/2017 1219   GFRAA >60 04/21/2017 0407   GFRAA >60 04/13/2017 1219   CA 19-9 04/21/2017: 4381 Imaging:  Dg Chest 2 View  Result Date: 04/20/2017 CLINICAL DATA:  History of metastatic pancreatic cancer. Low blood pressure. EXAM: CHEST  2 VIEW COMPARISON:  04/19/2017 FINDINGS: Stable right-sided injectable port. Cardiomediastinal silhouette is normal. Mediastinal contours appear intact. There is no evidence of focal airspace consolidation, pleural effusion or pneumothorax. Chronic elevation of the hemidiaphragms with atelectatic changes at the lung bases. Osseous structures are without acute abnormality. Soft tissues are grossly normal. IMPRESSION: Low lung volume with chronic elevation of the hemidiaphragms and atelectatic changes at the lung bases. Electronically Signed   By: Fidela Salisbury M.D.   On: 04/20/2017 12:17    Medications: I have reviewed the patient's current medications.   Assessment/Plan: 1. Metastatic pancreas cancer ? Pancreas body mass and liver metastases noted on CT abdomen/pelvis 08/11/2016 ? Ultrasound-guided biopsy of a right liver lesion 08/16/2016 revealed  poorly differentiated adenocarcinoma consistent with pancreas cancer ? Foundation 1-microsatellite stable; tumor mutational burden 1; ERBB2 amplification ? Cycle 1 gemcitabine/Abraxane 09/13/2016; 09/29/2016 ? Cycle 2 gemcitabine/Abraxane 10/11/2016, 10/18/2016 ? Cycle 3 gemcitabine/Abraxane 11/01/2016, 11/08/2016 ? Cycle 4 gemcitabine/Abraxane 11/23/2016,11/29/2016 ? CT chest 12/06/2016-liver lesions appear smaller ? Cycle 5 gemcitabine/Abraxane 12/13/2016 ? CT abdomen/pelvis 01/01/2017-new and enlarging hepatic  masses. Enlarging pancreatic mass. ? Cycle 6 gemcitabine/Abraxane 01/03/2017 ? Rising CA 19-9, increased pain October 2018 ? Cycle 1 FOLFOX 01/31/2017 ? Cycle 2 FOLFOX 02/19/2017 ? Cycle 3FOLFIRINOX 03/06/2017 ? CT 03/11/2017 (compared to 01/01/2017) increased liver lesions and enlargement of the pancreas mass ? Cycle4FOLFIRINOX 03/21/2017 (oxaliplatin dose reduced and Neulasta added) ? Cycle 5 FOLFIRINOX 04/09/2017 ? CT 04/13/2017- decrease in the size of the pancreas mass, no change in multiple liver lesions  2. Pain secondary to pancreas cancer, MS Contin added 03/13/2017  3. Hypertension  4. Sleep apnea  5. Chronic low back pain  6. Recurrent urinary tract infections  7. Depression  8. Migraine headaches  9. Port-A-Cath placement 08/28/2016  10. Rash following cycle 1 gemcitabine/Abraxane-drug rash?  11. Nausea/vomiting following cycle 1 gemcitabine/Abraxane-antiemetic regimen adjusted with addition of Aloxi  12. Diabetes  13. Right cephalic vein thrombosis [id number]/67/3419-[id number] with Xarelto  14. CT chest 12/06/2016 done to evaluate dyspnea-several 3-4 mm nodular opacities in the lung parenchyma etiology uncertain. Known mass in the body of the pancreas measuring 3.3 x 2.4 cm;small enhancing lesion in the anterior dome of the liver measures 8 mm.  15. Right forearm rash following cycle 5 day 8 gemcitabine/Abraxane. Resolved  16.Klebsiella urinary tract infection 03/14/2017  17.Admission 04/13/2017 with diaphoresis and tachycardia-resolved, 1 blood culture positive for a coagulase-negative Staphylococcus  18.  Admission 04/19/2017 with recurrent hypotension   Disposition: Ms. Furnas presents with recurrent diaphoresis, hypotension, and general weakness/dizziness.  The etiology of her symptoms is unclear.  I doubt her symptoms are related to progression of the pancreas cancer based on the improved CA 19-9 and recent CT.  Repeat  cultures were negative during the recent admission.  She does not have a fever to suggest infection.  It remains possible the Port-A-Cath is infected.  A cortisol level was normal 04/21/2017. A CT 04/13/2017 was negative for pulmonary embolism.  She received intravenous fluids at the Cancer center today without improvement in her symptoms.  I discussed the case with the hospitalist service.  They have graciously agreed to admit Ms. Emberton for further evaluation.  Chemotherapy is placed on hold.  40 minutes were spent with the patient today.  The majority of the time was used for counseling and coordination of care.   Betsy Coder, MD  04/24/2017  8:28 AM

## 2017-04-25 ENCOUNTER — Inpatient Hospital Stay (HOSPITAL_COMMUNITY): Payer: 59

## 2017-04-25 DIAGNOSIS — G8928 Other chronic postprocedural pain: Secondary | ICD-10-CM | POA: Diagnosis not present

## 2017-04-25 DIAGNOSIS — R61 Generalized hyperhidrosis: Secondary | ICD-10-CM | POA: Diagnosis not present

## 2017-04-25 DIAGNOSIS — K5900 Constipation, unspecified: Secondary | ICD-10-CM

## 2017-04-25 DIAGNOSIS — C787 Secondary malignant neoplasm of liver and intrahepatic bile duct: Secondary | ICD-10-CM | POA: Diagnosis not present

## 2017-04-25 DIAGNOSIS — R978 Other abnormal tumor markers: Secondary | ICD-10-CM | POA: Diagnosis not present

## 2017-04-25 DIAGNOSIS — Z7901 Long term (current) use of anticoagulants: Secondary | ICD-10-CM | POA: Diagnosis not present

## 2017-04-25 DIAGNOSIS — C251 Malignant neoplasm of body of pancreas: Secondary | ICD-10-CM

## 2017-04-25 DIAGNOSIS — I951 Orthostatic hypotension: Secondary | ICD-10-CM | POA: Diagnosis not present

## 2017-04-25 DIAGNOSIS — I5030 Unspecified diastolic (congestive) heart failure: Secondary | ICD-10-CM

## 2017-04-25 DIAGNOSIS — R531 Weakness: Secondary | ICD-10-CM | POA: Diagnosis not present

## 2017-04-25 DIAGNOSIS — C259 Malignant neoplasm of pancreas, unspecified: Secondary | ICD-10-CM | POA: Diagnosis not present

## 2017-04-25 LAB — URINALYSIS, ROUTINE W REFLEX MICROSCOPIC
BILIRUBIN URINE: NEGATIVE
Glucose, UA: NEGATIVE mg/dL
Hgb urine dipstick: NEGATIVE
Ketones, ur: 20 mg/dL — AB
Leukocytes, UA: NEGATIVE
NITRITE: NEGATIVE
PH: 6 (ref 5.0–8.0)
Protein, ur: NEGATIVE mg/dL
SPECIFIC GRAVITY, URINE: 1.017 (ref 1.005–1.030)

## 2017-04-25 LAB — ECHOCARDIOGRAM COMPLETE
HEIGHTINCHES: 63 in
WEIGHTICAEL: 3851.88 [oz_av]

## 2017-04-25 LAB — CANCER ANTIGEN 19-9: CAN 19-9: 6818 U/mL — AB (ref 0–35)

## 2017-04-25 MED ORDER — LACTULOSE 10 GM/15ML PO SOLN
30.0000 g | ORAL | Status: AC
  Administered 2017-04-25: 30 g via ORAL
  Filled 2017-04-25: qty 60

## 2017-04-25 MED ORDER — SORBITOL 70 % SOLN
30.0000 mL | Freq: Once | Status: AC
  Administered 2017-04-25: 30 mL via ORAL
  Filled 2017-04-25: qty 30

## 2017-04-25 NOTE — Progress Notes (Signed)
  Echocardiogram 2D Echocardiogram has been performed.  Heather Frederick F 04/25/2017, 4:04 PM

## 2017-04-25 NOTE — Progress Notes (Signed)
PROGRESS NOTE    Heather Frederick  ZDG:[id number] DOB: [birth year] DOA: 04/24/2017 PCP: [name], MD    Brief Narrative:  21yow PMH metastatic pancreatic cancer, direct admit from [place] for recurrent diaphoresis, hypotension, generalized weakness, dizziness.  Patient felt ok 1/28. This AM felt poorly, no appetite, didn't eat anything. Went for scheduled appt with Dr. Benay Spice. There had chills, diaphoresis, generalized weakness, shortness of breath, nausea and malaise without specific aggravating or alleviating factors. Third hospitalization now in 11 days and symptoms are getting worse. She correlates symptoms with chemo administration though this and the last episode do not correlate.   Admission 1/18-1/23 with SIRS, diaphoresis and tachycardia-resolved, 1 blood culture positive for a coagulase-negative Staphylococcus  Admission 1/24-1/26 with recurrent hypotension, thought orthostatic. Negative workup. Seen by cardiology for tachycardia and hypotension and impression was chemotherapy-related side effects. Seen by pulmonology for hypotension, diaphoresis, SOB. Recommended cortisol check and consider low-dose midodrine.    Assessment & Plan:   Principal Problem:   Diaphoresis Active Problems:   OSA (obstructive sleep apnea)   Pancreatic carcinoma metastatic to liver [facility])   Chronic anticoagulation   Dyspnea   Chronic pain   Generalized weakness  Diaphoresis, chills, malaise, generalized weakness, nausea. -Unclear etiology -Last chemo 1/16. Labs unremarkable thus far -Patient reports early satiety, nausea, generalized abd pain, needing to use miralax prior to admit. Given concurrent opiate use, consider element of symptomatic constipation - Will give trial of lactulose - No obvious signs of infection. Cultures pending  Metastatic pancreatic cancer - per oncology chemotherapy on hold - Discussed with Dr. Benay Spice  Chronic pain secondary to malignancy - continue  chronic pain medications as tolerated  OSA - continue CPAP as tolerated  DVT prophylaxis: Xarelto Code Status: Full Family Communication: Pt in room, family at bedside Disposition Plan: Uncertain at this time  Consultants:   Oncology  Procedures:     Antimicrobials: Anti-infectives (From admission, onward)   None       Subjective: Complaining of continued nausea and lower quadrant pain  Objective: Vitals:   04/24/17 1637 04/24/17 2049 04/25/17 0458 04/25/17 1443  BP: 94/65 (!) 117/56 95/62 116/78  Pulse: 92 100 (!) 111 98  Resp: 20 20 20 20   Temp: 97.7 F (36.5 C) 98.1 F (36.7 C) (!) 97.5 F (36.4 C) 97.6 F (36.4 C)  TempSrc: Axillary Oral Oral Oral  SpO2: 100% 99% 97% 100%  Weight: 109.2 kg (240 lb 11.9 oz)     Height: 5\' 3"  (1.6 m)       Intake/Output Summary (Last 24 hours) at 04/25/2017 1502 Last data filed at 04/25/2017 0500 Gross per 24 hour  Intake -  Output 400 ml  Net -400 ml   Filed Weights   04/24/17 1637  Weight: 109.2 kg (240 lb 11.9 oz)    Examination:  General exam: Appears calm and comfortable  Respiratory system: Clear to auscultation. Respiratory effort normal. Cardiovascular system: S1 & S2 heard, RRR Gastrointestinal system: Abdomen is nondistended, soft and nontender. No organomegaly or masses felt. Normal bowel sounds heard. Central nervous system: Alert and oriented. No focal neurological deficits. Extremities: Symmetric 5 x 5 power. Skin: No rashes, lesions Psychiatry: Judgement and insight appear normal. Mood & affect appropriate.   Data Reviewed: I have personally reviewed following labs and imaging studies  CBC: Recent Labs  Lab 04/19/17 1858 04/20/17 1429 04/24/17 0750  WBC 9.5 11.2* 10.3  NEUTROABS  --   --  7.4*  HGB 9.0* 8.5*  --  HCT 28.2* 27.6* 34.1*  MCV 85.5 87.3 86.1  PLT 114* 127* 093   Basic Metabolic Panel: Recent Labs  Lab 04/19/17 1858 04/21/17 0407 04/24/17 0750  NA 131* 133* 135*  K  3.4* 3.6 4.2  CL 97* 101 99  CO2 24 26 23   GLUCOSE 166* 116* 139  BUN 8 <5* 6*  CREATININE 0.65 0.59  --   CALCIUM 8.8* 8.2* 10.0   GFR: Estimated Creatinine Clearance: 89.8 mL/min (by C-G formula based on SCr of 0.59 mg/dL). Liver Function Tests: Recent Labs  Lab 04/19/17 1858 04/21/17 0407 04/24/17 0750  AST 23 26 24   ALT 19 19 20   ALKPHOS 173* 166* 218*  BILITOT 0.4 0.4 0.8  PROT 6.6 6.1* 7.1  ALBUMIN 2.8* 2.7* 2.8*   No results for input(s): LIPASE, AMYLASE in the last 168 hours. No results for input(s): AMMONIA in the last 168 hours. Coagulation Profile: No results for input(s): INR, PROTIME in the last 168 hours. Cardiac Enzymes: Recent Labs  Lab 04/19/17 1858 04/20/17 0002 04/20/17 0652  TROPONINI <0.03 <0.03 <0.03   BNP (last 3 results) No results for input(s): PROBNP in the last 8760 hours. HbA1C: No results for input(s): HGBA1C in the last 72 hours. CBG: Recent Labs  Lab 04/20/17 0803  GLUCAP 140*   Lipid Profile: No results for input(s): CHOL, HDL, LDLCALC, TRIG, CHOLHDL, LDLDIRECT in the last 72 hours. Thyroid Function Tests: No results for input(s): TSH, T4TOTAL, FREET4, T3FREE, THYROIDAB in the last 72 hours. Anemia Panel: No results for input(s): VITAMINB12, FOLATE, FERRITIN, TIBC, IRON, RETICCTPCT in the last 72 hours. Sepsis Labs: No results for input(s): PROCALCITON, LATICACIDVEN in the last 168 hours.  Recent Results (from the past 240 hour(s))  Culture, blood (Routine X 2) w Reflex to ID Panel     Status: None   Collection Time: 04/17/17 10:44 AM  Result Value Ref Range Status   Specimen Description BLOOD RIGHT ANTECUBITAL  Final   Special Requests   Final    BOTTLES DRAWN AEROBIC AND ANAEROBIC Blood Culture adequate volume   Culture   Final    NO GROWTH 5 DAYS Performed at [facility], [address], [place], [place] [address]    Report Status 04/22/2017 FINAL  Final  Culture, blood (Routine X 2) w Reflex to ID Panel      Status: None   Collection Time: 04/17/17 10:44 AM  Result Value Ref Range Status   Specimen Description BLOOD LEFT ANTECUBITAL  Final   Special Requests   Final    BOTTLES DRAWN AEROBIC AND ANAEROBIC Blood Culture adequate volume   Culture   Final    NO GROWTH 5 DAYS Performed at [facility], [place] [address], [place], [place] [address]    Report Status 04/22/2017 FINAL  Final  Culture, blood (routine x 2)     Status: None   Collection Time: 04/19/17  6:58 PM  Result Value Ref Range Status   Specimen Description BLOOD RIGHT ANTECUBITAL  Final   Special Requests   Final    BOTTLES DRAWN AEROBIC ONLY Blood Culture adequate volume   Culture   Final    NO GROWTH 5 DAYS Performed at [facility], [place] [address], [place], [place] [address]    Report Status 04/24/2017 FINAL  Final  Culture, blood (routine x 2)     Status: None   Collection Time: 04/19/17  7:04 PM  Result Value Ref Range Status   Specimen  Description BLOOD RIGHT ANTECUBITAL  Final   Special Requests   Final    BOTTLES DRAWN AEROBIC AND ANAEROBIC Blood Culture adequate volume   Culture   Final    NO GROWTH 5 DAYS Performed at [facility], [address], [place], [place] [address]    Report Status 04/24/2017 FINAL  Final  Culture, Urine     Status: None   Collection Time: 04/20/17  5:16 AM  Result Value Ref Range Status   Specimen Description URINE, RANDOM  Final   Special Requests Normal  Final   Culture   Final    NO GROWTH Performed at [facility], [place] [address], [place], [place] [address]    Report Status 04/21/2017 FINAL  Final  Culture, Blood     Status: None (Preliminary result)   Collection Time: 04/24/17  9:54 AM  Result Value Ref Range Status   Specimen Description   Final    BLOOD LEFT ARM Performed at [facility], [place] [address], [place], [place] [address]    Special Requests   Final    NONE Performed at [facility],  [place] [address] [place], [place] [address]    Culture   Final    NO GROWTH 1 DAY Performed at [facility], [place] [address], [place], [place] [address]    Report Status PENDING  Incomplete  Culture, Blood     Status: None (Preliminary result)   Collection Time: 04/24/17 12:30 PM  Result Value Ref Range Status   Specimen Description   Final    PORTA CATH Performed at [facility], [place] [address], [place], [place] [address]    Special Requests   Final    BOTTLES DRAWN AEROBIC AND ANAEROBIC Blood Culture adequate volume   Culture   Final    NO GROWTH < 24 HOURS Performed at [facility], [place] [address], [place],  [address]    Report Status PENDING  Incomplete     Radiology Studies: Dg Chest 2 View  Result Date: 04/24/2017 CLINICAL DATA:  Shortness of breath and abdominal pain today. Chills. EXAM: CHEST  2 VIEW COMPARISON:  04/20/2017 FINDINGS: Power port type central venous catheter with tip in the mid SVC region. No pneumothorax. Shallow inspiration with elevation of the hemidiaphragms. Cardiac enlargement with mild vascular congestion. Diffuse interstitial pattern to the lungs is increasing since previous study suggesting increasing edema. Linear atelectasis in the lung bases. No pneumothorax. Mediastinal contours appear intact. IMPRESSION: Shallow inspiration with elevation of the hemidiaphragms and atelectasis in the lung bases. Cardiac enlargement with pulmonary vascular congestion and increasing interstitial edema since previous study. Electronically Signed   By: Lucienne Capers M.D.   On: 04/24/2017 23:50    Scheduled Meds: . citalopram  20 mg Oral Daily  . insulin detemir  10 Units Subcutaneous QHS  . midodrine  5 mg Oral TID WC  . morphine  30 mg Oral Q12H  . oxybutynin  10 mg Oral q morning - 10a  . pantoprazole  40 mg Oral Daily  . pregabalin  200 mg Oral Daily  . rivaroxaban  20 mg Oral QHS  . traZODone  50 mg Oral QHS    Continuous Infusions:   LOS: 1 day   Marylu Lund, MD [facility] Pager [phone]  If 7PM-7AM, please contact night-coverage [email or web address] Password [email or web address] 04/25/2017, 3:02 PM

## 2017-04-25 NOTE — Progress Notes (Signed)
Refuses SSE at this time states will see how it goes overnite.

## 2017-04-25 NOTE — Progress Notes (Signed)
IP PROGRESS NOTE  Subjective:   She feels better this morning.  She has nausea and right upper lateral abdominal pain.  Objective: Vital signs in last 24 hours: Blood pressure 116/78, pulse 98, temperature 97.6 F (36.4 C), temperature source Oral, resp. rate 20, height '5\' 3"'  (1.6 m), weight 240 lb 11.9 oz (109.2 kg), SpO2 100 %.  Intake/Output from previous day: 01/29 0701 - 01/30 0700 In: -  Out: 400 [Urine:400]  Physical Exam:  Lungs: Clear bilaterally Cardiac: Regular rate and rhythm Abdomen: No hepatomegaly, soft  Extremities: No leg edema  Portacath/PICC-without erythema  Lab Results: Recent Labs    04/24/17 0750  WBC 10.3  HCT 34.1*  PLT 184    BMET Recent Labs    04/24/17 0750  NA 135*  K 4.2  CL 99  CO2 23  GLUCOSE 139  BUN 6*  CALCIUM 10.0    No results found for: CEA1  Studies/Results: Dg Chest 2 View  Result Date: 04/24/2017 CLINICAL DATA:  Shortness of breath and abdominal pain today. Chills. EXAM: CHEST  2 VIEW COMPARISON:  04/20/2017 FINDINGS: Power port type central venous catheter with tip in the mid SVC region. No pneumothorax. Shallow inspiration with elevation of the hemidiaphragms. Cardiac enlargement with mild vascular congestion. Diffuse interstitial pattern to the lungs is increasing since previous study suggesting increasing edema. Linear atelectasis in the lung bases. No pneumothorax. Mediastinal contours appear intact. IMPRESSION: Shallow inspiration with elevation of the hemidiaphragms and atelectasis in the lung bases. Cardiac enlargement with pulmonary vascular congestion and increasing interstitial edema since previous study. Electronically Signed   By: Lucienne Capers M.D.   On: 04/24/2017 23:50   Dg Abd Portable 1v  Result Date: 04/25/2017 CLINICAL DATA:  Abdominal pain EXAM: PORTABLE ABDOMEN - 1 VIEW COMPARISON:  CT abdomen and pelvis 04/13/2017 FINDINGS: Prior ventral hernia repair in pelvis. Surgical clips RIGHT upper  quadrant related to prior cholecystectomy. Nonobstructive bowel gas pattern. No bowel dilatation or bowel wall thickening. Bones demineralized. Small pelvic phleboliths without definite urinary tract calcification. IMPRESSION: Normal bowel gas pattern. Electronically Signed   By: Lavonia Dana M.D.   On: 04/25/2017 15:37    Medications: I have reviewed the patient's current medications.  Assessment/Plan:  1. Metastatic pancreas cancer ? Pancreas body mass and liver metastases noted on CT abdomen/pelvis 08/11/2016 ? Ultrasound-guided biopsy of a right liver lesion 08/16/2016 revealed poorly differentiated adenocarcinoma consistent with pancreas cancer ? Foundation 1-microsatellite stable; tumor mutational burden 1; ERBB2 amplification ? Cycle 1 gemcitabine/Abraxane 09/13/2016; 09/29/2016 ? Cycle 2 gemcitabine/Abraxane 10/11/2016, 10/18/2016 ? Cycle 3 gemcitabine/Abraxane 11/01/2016, 11/08/2016 ? Cycle 4 gemcitabine/Abraxane 11/23/2016,11/29/2016 ? CT chest 12/06/2016-liver lesions appear smaller ? Cycle 5 gemcitabine/Abraxane 12/13/2016 ? CT abdomen/pelvis 01/01/2017-new and enlarging hepatic masses. Enlarging pancreatic mass. ? Cycle 6 gemcitabine/Abraxane 01/03/2017 ? Rising CA 19-9, increased pain October 2018 ? Cycle 1 FOLFOX 01/31/2017 ? Cycle 2 FOLFOX 02/19/2017 ? Cycle 3FOLFIRINOX 03/06/2017 ? CT 03/11/2017 (compared to 01/01/2017) increased liver lesions and enlargement of the pancreas mass ? Cycle4FOLFIRINOX 03/21/2017 (oxaliplatin dose reduced and Neulasta added) ? Cycle 5 FOLFIRINOX 04/09/2017 ? CT 04/13/2017- decrease in the size of the pancreas mass, no change in multiple liver lesions  2. Pain secondary to pancreas cancer, MS Contin added 03/13/2017  3. Hypertension  4. Sleep apnea  5. Chronic low back pain  6. Recurrent urinary tract infections  7. Depression  8. Migraine headaches  9. Port-A-Cath placement 08/28/2016  10. Rash  following cycle 1 gemcitabine/Abraxane-drug rash?  11. Nausea/vomiting  following cycle 1 gemcitabine/Abraxane-antiemetic regimen adjusted with addition of Aloxi  12. Diabetes  13. Right cephalic vein thrombosis 25/67/2091-[id number] with Xarelto  14. CT chest 12/06/2016 done to evaluate dyspnea-several 3-4 mm nodular opacities in the lung parenchyma etiology uncertain. Known mass in the body of the pancreas measuring 3.3 x 2.4 cm;small enhancing lesion in the anterior dome of the liver measures 8 mm.  15. Right forearm rash following cycle 5 day 8 gemcitabine/Abraxane. Resolved  16.Klebsiella urinary tract infection 03/14/2017  17.Admission 04/13/2017 with diaphoresis and tachycardia-resolved, 1 blood culture positive for a coagulase-negative Staphylococcus  18. Admission 04/19/2017 with recurrent hypotension  19.  Admission 04/24/2017 with recurrent hypotension and diaphoresis  Ms. Rettinger appears improved today.  The etiology of her presentation yesterday remains unclear.  There is no apparent source for infection.  Cultures are negative to date.  She did not have hypoglycemia yesterday.  I do not think the recurrent episodes of diaphoresis and hypotension are directly related to chemotherapy.  I discussed the case with Dr. Wyline Copas.  She will remain hospitalized for further evaluation and to follow-up on the repeat cultures.    She will continue MS Contin and Dilaudid for pain.     LOS: 1 day   Betsy Coder, MD   04/25/2017, 6:00 PM

## 2017-04-25 NOTE — Progress Notes (Signed)
   04/25/17 1500  Clinical Encounter Type  Visited With Family  Visit Type Initial  Stress Factors  Family Stress Factors Health changes   Recognized patient in the room from previous visit.  X-ray being taken so I visited with the husband in the hall.  Patient has been in and out of the hospital for a while and husband hopes they can figure out what is going on.  Will be back to visit and pray with the family. Chaplain Katherene Ponto

## 2017-04-26 ENCOUNTER — Ambulatory Visit: Payer: 59

## 2017-04-26 DIAGNOSIS — R531 Weakness: Secondary | ICD-10-CM | POA: Diagnosis not present

## 2017-04-26 DIAGNOSIS — C251 Malignant neoplasm of body of pancreas: Secondary | ICD-10-CM | POA: Diagnosis not present

## 2017-04-26 DIAGNOSIS — R978 Other abnormal tumor markers: Secondary | ICD-10-CM

## 2017-04-26 DIAGNOSIS — I951 Orthostatic hypotension: Secondary | ICD-10-CM | POA: Diagnosis not present

## 2017-04-26 DIAGNOSIS — C787 Secondary malignant neoplasm of liver and intrahepatic bile duct: Secondary | ICD-10-CM | POA: Diagnosis not present

## 2017-04-26 DIAGNOSIS — R61 Generalized hyperhidrosis: Secondary | ICD-10-CM | POA: Diagnosis not present

## 2017-04-26 DIAGNOSIS — G8928 Other chronic postprocedural pain: Secondary | ICD-10-CM | POA: Diagnosis not present

## 2017-04-26 DIAGNOSIS — Z7901 Long term (current) use of anticoagulants: Secondary | ICD-10-CM | POA: Diagnosis not present

## 2017-04-26 LAB — CBC
HCT: 31.4 % — ABNORMAL LOW (ref 36.0–46.0)
HEMOGLOBIN: 9.7 g/dL — AB (ref 12.0–15.0)
MCH: 26.8 pg (ref 26.0–34.0)
MCHC: 30.9 g/dL (ref 30.0–36.0)
MCV: 86.7 fL (ref 78.0–100.0)
PLATELETS: 187 10*3/uL (ref 150–400)
RBC: 3.62 MIL/uL — AB (ref 3.87–5.11)
RDW: 19.7 % — ABNORMAL HIGH (ref 11.5–15.5)
WBC: 13.3 10*3/uL — AB (ref 4.0–10.5)

## 2017-04-26 LAB — BASIC METABOLIC PANEL
ANION GAP: 8 (ref 5–15)
BUN: 5 mg/dL — ABNORMAL LOW (ref 6–20)
CALCIUM: 9.1 mg/dL (ref 8.9–10.3)
CHLORIDE: 102 mmol/L (ref 101–111)
CO2: 26 mmol/L (ref 22–32)
CREATININE: 0.66 mg/dL (ref 0.44–1.00)
GFR calc non Af Amer: >60 mL/min (ref >60)
Glucose, Bld: 120 mg/dL — ABNORMAL HIGH (ref 65–99)
Potassium: 3.8 mmol/L (ref 3.5–5.1)
SODIUM: 136 mmol/L (ref 135–145)

## 2017-04-26 MED ORDER — MAGNESIUM CITRATE PO SOLN: 1.0000 | Freq: Once | ORAL | 0 refills | Status: AC

## 2017-04-26 MED ORDER — HEPARIN SOD (PORK) LOCK FLUSH 100 UNIT/ML IV SOLN
500.0000 [IU] | INTRAVENOUS | Status: AC | PRN
  Administered 2017-04-26: 500 [IU]

## 2017-04-26 MED ORDER — SODIUM CHLORIDE 0.9% FLUSH
10.0000 mL | INTRAVENOUS | Status: DC | PRN
  Administered 2017-04-26: 10 mL

## 2017-04-26 MED ORDER — SORBITOL 70 % PO SOLN: 30.0000 mL | Freq: Every day | ORAL | 0 refills | Status: DC | PRN

## 2017-04-26 NOTE — Progress Notes (Signed)
IP PROGRESS NOTE  Subjective:   [name] reports no further diaphoresis.  She has pain at the right lower back this morning.  She had several bowel movements after laxatives yesterday.  Objective: Vital signs in last 24 hours: Blood pressure (!) 103/48, pulse (!) 103, temperature 98.3 F (36.8 C), temperature source Oral, resp. rate 18, height '5\' 3"'  (1.6 m), weight 240 lb 11.9 oz (109.2 kg), SpO2 95 %.  Intake/Output from previous day: 01/30 0701 - 01/31 0700 In: -  Out: 200 [Urine:200]  Physical Exam:  Portacath/PICC-without erythema  Lab Results: Recent Labs    04/24/17 0750 04/26/17 0446  WBC 10.3 13.3*  HGB  --  9.7*  HCT 34.1* 31.4*  PLT 184 187    BMET Recent Labs    04/24/17 0750 04/26/17 0446  NA 135* 136  K 4.2 3.8  CL 99 102  CO2 23 26  GLUCOSE 139 120*  BUN 6* 5*  CREATININE  --  0.66  CALCIUM 10.0 9.1    Studies/Results: Dg Chest 2 View  Result Date: 04/24/2017 CLINICAL DATA:  Shortness of breath and abdominal pain today. Chills. EXAM: CHEST  2 VIEW COMPARISON:  04/20/2017 FINDINGS: Power port type central venous catheter with tip in the mid SVC region. No pneumothorax. Shallow inspiration with elevation of the hemidiaphragms. Cardiac enlargement with mild vascular congestion. Diffuse interstitial pattern to the lungs is increasing since previous study suggesting increasing edema. Linear atelectasis in the lung bases. No pneumothorax. Mediastinal contours appear intact. IMPRESSION: Shallow inspiration with elevation of the hemidiaphragms and atelectasis in the lung bases. Cardiac enlargement with pulmonary vascular congestion and increasing interstitial edema since previous study. Electronically Signed   By: Lucienne Capers M.D.   On: 04/24/2017 23:50   Dg Abd Portable 1v  Result Date: 04/25/2017 CLINICAL DATA:  Abdominal pain EXAM: PORTABLE ABDOMEN - 1 VIEW COMPARISON:  CT abdomen and pelvis 04/13/2017 FINDINGS: Prior ventral hernia repair in pelvis.  Surgical clips RIGHT upper quadrant related to prior cholecystectomy. Nonobstructive bowel gas pattern. No bowel dilatation or bowel wall thickening. Bones demineralized. Small pelvic phleboliths without definite urinary tract calcification. IMPRESSION: Normal bowel gas pattern. Electronically Signed   By: Lavonia Dana M.D.   On: 04/25/2017 15:37    Medications: I have reviewed the patient's current medications.  Assessment/Plan:  1. Metastatic pancreas cancer ? Pancreas body mass and liver metastases noted on CT abdomen/pelvis 08/11/2016 ? Ultrasound-guided biopsy of a right liver lesion 08/16/2016 revealed poorly differentiated adenocarcinoma consistent with pancreas cancer ? Foundation 1-microsatellite stable; tumor mutational burden 1; ERBB2 amplification ? Cycle 1 gemcitabine/Abraxane 09/13/2016; 09/29/2016 ? Cycle 2 gemcitabine/Abraxane 10/11/2016, 10/18/2016 ? Cycle 3 gemcitabine/Abraxane 11/01/2016, 11/08/2016 ? Cycle 4 gemcitabine/Abraxane 11/23/2016,11/29/2016 ? CT chest 12/06/2016-liver lesions appear smaller ? Cycle 5 gemcitabine/Abraxane 12/13/2016 ? CT abdomen/pelvis 01/01/2017-new and enlarging hepatic masses. Enlarging pancreatic mass. ? Cycle 6 gemcitabine/Abraxane 01/03/2017 ? Rising CA 19-9, increased pain October 2018 ? Cycle 1 FOLFOX 01/31/2017 ? Cycle 2 FOLFOX 02/19/2017 ? Cycle 3FOLFIRINOX 03/06/2017 ? CT 03/11/2017 (compared to 01/01/2017) increased liver lesions and enlargement of the pancreas mass ? Cycle4FOLFIRINOX 03/21/2017 (oxaliplatin dose reduced and Neulasta added) ? Cycle 5 FOLFIRINOX 04/09/2017 ? CT 04/13/2017- decrease in the size of the pancreas mass, no change in multiple liver lesions  2. Pain secondary to pancreas cancer, MS Contin added 03/13/2017  3. Hypertension  4. Sleep apnea  5. Chronic low back pain  6. Recurrent urinary tract infections  7. Depression  8. Migraine headaches  9.  Port-A-Cath placement  08/28/2016  10. Rash following cycle 1 gemcitabine/Abraxane-drug rash?  11. Nausea/vomiting following cycle 1 gemcitabine/Abraxane-antiemetic regimen adjusted with addition of Aloxi  12. Diabetes  13. Right cephalic vein thrombosis [id number]/47/[id number] with Xarelto  14. CT chest 12/06/2016 done to evaluate dyspnea-several 3-4 mm nodular opacities in the lung parenchyma etiology uncertain. Known mass in the body of the pancreas measuring 3.3 x 2.4 cm;small enhancing lesion in the anterior dome of the liver measures 8 mm.  15. Right forearm rash following cycle 5 day 8 gemcitabine/Abraxane. Resolved  16.Klebsiella urinary tract infection 03/14/2017  17.Admission 04/13/2017 with diaphoresis and tachycardia-resolved, 1 blood culture positive for a coagulase-negative Staphylococcus  18. Admission 04/19/2017 with recurrent hypotension  19.  Admission 04/24/2017 with recurrent hypotension and diaphoresis  [name] appears at baseline this morning.  No further episodes of diaphoresis or dizziness.  Blood cultures from 04/24/2017 remain negative.  The etiology of the recurrent episodes of profound weakness and diaphoresis remains unclear.  She appears stable for discharge from an oncology standpoint.  I will arrange for follow-up at the Cancer center during the week of 04/30/2017.  I discussed the elevated CA 19-9 and treatment options with [name] and her husband.  She understands standard systemic options are limited beyond the current treatment.  We will consider referring her back to Dr. Sigurd Sos at [facility] to investigate clinical trial options.     LOS: 1 day   Betsy Coder, MD   04/26/2017, 9:40 AM

## 2017-04-26 NOTE — Discharge Summary (Signed)
Physician Discharge Summary  NIREL BABLER ZOX:[id number] DOB: [birth year] DOA: 04/24/2017  PCP: [name], MD  Admit date: 04/24/2017 Discharge date: 04/26/2017  Admitted From: Home Disposition:  Home  Recommendations for Outpatient Follow-up:  1. Follow up with PCP in 1-2 weeks 2. Follow up with Oncology as scheduled  Discharge Condition:Improved CODE STATUS:Full Diet recommendation: Diabetic   Brief/Interim Summary: 14yow PMH metastatic pancreatic cancer, direct admit from [facility] for recurrent diaphoresis, hypotension, generalized weakness, dizziness.  Patient felt ok 1/28. This AM felt poorly, no appetite, didn't eat anything. Went for scheduled appt with Dr. Benay Spice. There had chills, diaphoresis, generalized weakness, shortness of breath, nausea and malaise without specific aggravating or alleviating factors. Third hospitalization now in 11 days and symptoms are getting worse. She correlates symptoms with chemo administration though this and the last episode do not correlate.  Admission 1/18-1/23with SIRS,diaphoresis and tachycardia-resolved, 1 blood culture positive for a coagulase-negative Staphylococcus  Admission 1/24-1/26with recurrent hypotension, thought orthostatic. Negative workup. Seen by cardiology for tachycardia and hypotension and impression was chemotherapy-related side effects. Seen by pulmonology for hypotension, diaphoresis, SOB. Recommended cortisol check and consider low-dose midodrine.  Diaphoresis, chills, malaise, generalized weakness, nausea. -Unclear etiology -Last chemo 1/16. Labs have remained unremarkable -Patient reports early satiety, nausea, generalized abd pain, needing to use miralax prior to admit. Given concurrent opiate use, considered element of symptomatic constipation - No obvious signs of infection. -Multiple large bowel movements resulted from cathartics with subsequent improvement in symptoms  Metastatic pancreatic  cancer - per oncology chemotherapy on hold - Plan per Dr. Benay Spice  Chronic pain secondary to malignancy - continue chronic pain medications as tolerated  OSA - continue CPAP as tolerated   Discharge Diagnoses:  Principal Problem:   Diaphoresis Active Problems:   OSA (obstructive sleep apnea)   Pancreatic carcinoma metastatic to liver [facility])   Chronic anticoagulation   Dyspnea   Chronic pain   Generalized weakness    Discharge Instructions   Allergies as of 04/26/2017      Reactions   Topamax [topiramate] Other (See Comments)   Stroke like symptoms   Aleve [naproxen Sodium] Hives   Has tolerated Voltaren topical as well as aspirin.   Bee Venom Swelling   Echinacea Hives   Other Other (See Comments)   Feathers cause sinus congestion   Sulfa Antibiotics Hives   Advil [ibuprofen] Hives   Has tolerated Voltaren topical as well as aspirin.      Medication List    TAKE these medications   aspirin EC 81 MG tablet Take 81 mg by mouth daily.   citalopram 20 MG tablet Commonly known as:  CELEXA TAKE ONE (1) TABLET BY MOUTH EVERY DAY What changed:  See the new instructions.   CRANBERRY PLUS VITAMIN C PO Take 1 tablet by mouth daily. 4200 mg   cyclobenzaprine 5 MG tablet Commonly known as:  FLEXERIL TAKE ONE (1) TABLET BY MOUTH 3 TIMES DAILY AS NEEDED FOR MUSCLE SPASMS What changed:  See the new instructions.   eletriptan 40 MG tablet Commonly known as:  RELPAX Take 1 tablet (40 mg total) by mouth every 2 (two) hours as needed for migraine.   HYDROcodone-acetaminophen 5-325 MG tablet Commonly known as:  NORCO/VICODIN Take 1 tablet by mouth every 6 (six) hours as needed for moderate pain.   HYDROmorphone 4 MG tablet Commonly known as:  DILAUDID Take 0.5 tablets (2 mg total) by mouth every 4 (four) hours as needed for severe pain. What changed:  how  much to take   hydrOXYzine 10 MG tablet Commonly known as:  ATARAX/VISTARIL Take 10 mg by mouth every 8  (eight) hours as needed for itching.   insulin detemir 100 unit/ml Soln Commonly known as:  LEVEMIR Inject 10 Units into the skin at bedtime.   insulin lispro 100 UNIT/ML injection Commonly known as:  HUMALOG Inject 2-10 Units into the skin. Sliding Scale   lidocaine-prilocaine cream Commonly known as:  EMLA APPLY 1 APPLICATION TOPICALLY AS NEEDED.APPLY TO PORT A CATH SITE 1 HOUR PRIOR TO NEEDLE STICK.   LORazepam 1 MG tablet Commonly known as:  ATIVAN Take 1 tablet (1 mg total) by mouth every 8 (eight) hours as needed for anxiety or sedation (or nausea).   LYRICA 200 MG capsule Generic drug:  pregabalin Take 200 mg by mouth daily.   magnesium citrate Soln Take 296 mLs (1 Bottle total) by mouth once for 1 dose.   midodrine 5 MG tablet Commonly known as:  PROAMATINE Take 1 tablet (5 mg total) by mouth 3 (three) times daily with meals.   morphine 30 MG 12 hr tablet Commonly known as:  MS CONTIN Take 30 mg by mouth every 12 (twelve) hours.   multivitamin-lutein Caps capsule Take 1 capsule by mouth daily.   nitroGLYCERIN 0.4 MG SL tablet Commonly known as:  NITROSTAT Place 1 tablet (0.4 mg total) under the tongue every 5 (five) minutes as needed for chest pain.   ondansetron 8 MG disintegrating tablet Commonly known as:  ZOFRAN-ODT Take 1 tablet (8 mg total) by mouth every 8 (eight) hours as needed for nausea or vomiting.   oxybutynin 10 MG 24 hr tablet Commonly known as:  DITROPAN-XL Take 1 tablet (10 mg total) by mouth every morning.   oxyCODONE 5 MG immediate release tablet Commonly known as:  Oxy IR/ROXICODONE TAKE 1 TABLET BY MOUTH EVERY 4 HOURS AS NEEDED FOR SEVERE PAIN What changed:  See the new instructions.   pantoprazole 40 MG tablet Commonly known as:  PROTONIX TAKE ONE (1) TABLET BY MOUTH TWO (2) TIMES DAILY What changed:  See the new instructions.   prochlorperazine 10 MG tablet Commonly known as:  COMPAZINE Take 10 mg by mouth every 6 (six) hours  as needed for nausea or vomiting.   simethicone 125 MG chewable tablet Commonly known as:  MYLICON Chew 973-532 mg by mouth 2 (two) times daily as needed for flatulence.   sorbitol 70 % solution Take 30 mLs by mouth daily as needed (for constipation).   vitamin B-12 1000 MCG tablet Commonly known as:  CYANOCOBALAMIN Take 2,500 mcg by mouth every morning.   XARELTO 20 MG Tabs tablet Generic drug:  rivaroxaban Take 20 mg daily by mouth.      Follow-up Information    [name], MD Follow up.   Specialty:  Internal Medicine Contact information: [address] [place] [address] [phone]        Ladell Pier, MD Follow up.   Specialty:  Oncology Why:  as scheduled  Contact information: [place] [address] [phone]          Allergies  Allergen Reactions  . Topamax [Topiramate] Other (See Comments)    Stroke like symptoms  . Aleve [Naproxen Sodium] Hives    Has tolerated Voltaren topical as well as aspirin.  . Bee Venom Swelling  . Echinacea Hives  . Other Other (See Comments)    Feathers cause sinus congestion  . Sulfa Antibiotics  Hives  . Advil [Ibuprofen] Hives    Has tolerated Voltaren topical as well as aspirin.    Consultations:  Oncology  Procedures/Studies: Dg Chest 2 View  Result Date: 04/24/2017 CLINICAL DATA:  Shortness of breath and abdominal pain today. Chills. EXAM: CHEST  2 VIEW COMPARISON:  04/20/2017 FINDINGS: Power port type central venous catheter with tip in the mid SVC region. No pneumothorax. Shallow inspiration with elevation of the hemidiaphragms. Cardiac enlargement with mild vascular congestion. Diffuse interstitial pattern to the lungs is increasing since previous study suggesting increasing edema. Linear atelectasis in the lung bases. No pneumothorax. Mediastinal contours appear intact. IMPRESSION: Shallow inspiration with elevation of the hemidiaphragms and atelectasis in the  lung bases. Cardiac enlargement with pulmonary vascular congestion and increasing interstitial edema since previous study. Electronically Signed   By: Lucienne Capers M.D.   On: 04/24/2017 23:50   Dg Chest 2 View  Result Date: 04/20/2017 CLINICAL DATA:  History of metastatic pancreatic cancer. Low blood pressure. EXAM: CHEST  2 VIEW COMPARISON:  04/19/2017 FINDINGS: Stable right-sided injectable port. Cardiomediastinal silhouette is normal. Mediastinal contours appear intact. There is no evidence of focal airspace consolidation, pleural effusion or pneumothorax. Chronic elevation of the hemidiaphragms with atelectatic changes at the lung bases. Osseous structures are without acute abnormality. Soft tissues are grossly normal. IMPRESSION: Low lung volume with chronic elevation of the hemidiaphragms and atelectatic changes at the lung bases. Electronically Signed   By: Fidela Salisbury M.D.   On: 04/20/2017 12:17   Dg Chest 2 View  Result Date: 04/19/2017 CLINICAL DATA:  Hypotension and history of pancreatic carcinoma EXAM: CHEST  2 VIEW COMPARISON:  04/13/2017 FINDINGS: Right-sided chest wall port is again seen. Elevation the right hemidiaphragm is again noted. The lungs are well aerated bilaterally. Minimal right basilar atelectasis is again seen. No new focal abnormality is noted. IMPRESSION: Stable right basilar atelectasis. Electronically Signed   By: Inez Catalina M.D.   On: 04/19/2017 20:49   Dg Chest 2 View  Result Date: 04/13/2017 CLINICAL DATA:  Shortness of breath. EXAM: CHEST  2 VIEW COMPARISON:  Radiograph of February 23, 2017. FINDINGS: Stable cardiomediastinal silhouette. No pneumothorax is noted. Elevated right hemidiaphragm is noted with mild right basilar subsegmental atelectasis. Right internal jugular Port-A-Cath is noted with distal tip in expected position of the SVC. Left lung is clear. No significant pleural effusion is noted. Bony thorax is unremarkable. IMPRESSION: Elevated  right hemidiaphragm with mild right basilar subsegmental atelectasis. No other significant abnormality is noted. Electronically Signed   By: Marijo Conception, M.D.   On: 04/13/2017 14:56   Ct Angio Chest Pe W/cm &/or Wo Cm  Result Date: 04/13/2017 CLINICAL DATA:  Pancreatic carcinoma. Referral cancer center for hypotension and malaise. Chills and nausea and vomiting. Chronic RIGHT-sided abdominal pain. EXAM: CT ANGIOGRAPHY CHEST CT ABDOMEN AND PELVIS WITH CONTRAST TECHNIQUE: Multidetector CT imaging of the chest was performed using the standard protocol during bolus administration of intravenous contrast. Multiplanar CT image reconstructions and MIPs were obtained to evaluate the vascular anatomy. Multidetector CT imaging of the abdomen and pelvis was performed using the standard protocol during bolus administration of intravenous contrast. CONTRAST:  129mL ISOVUE-370 IOPAMIDOL (ISOVUE-370) INJECTION 76% COMPARISON:  CT chest abdomen pelvis 03/11/2017 FINDINGS: CTA CHEST FINDINGS Cardiovascular: No filling defects within the pulmonary arteries arteries to suggest acute pulmonary embolism. No significant vascular findings. Normal heart size. No pericardial effusion. Mediastinum/Nodes: No axillary supraclavicular adenopathy. Port in the RIGHT chest wall with tip in distal  SVC. No mediastinal hilar adenopathy. No pericardial fluid. Esophagus normal. Lungs/Pleura: 6 mm RIGHT upper lobe pulmonary nodule is not changed compared to 03/11/2017. Lesions increased in size from 12/06/2016 no new pulmonary nodules. Musculoskeletal: No aggressive osseous lesion Review of the MIP images confirms the above findings. CT ABDOMEN and PELVIS FINDINGS Hepatobiliary: Multiple round low-density lesions in liver parenchyma consistent with hepatic metastasis are not changed in short interval from 03/11/2017. No new lesions. Postcholecystectomy. Pancreas: Lesion head of pancreas measures smaller at 3.7 by 4.9 cm compared to 4.5 x 5.2 cm.  There is atrophy duct dilatation the body and tail the pancreas. Spleen: Geographic low attenuation within the medial aspect the spleen is likely a profusion phenomena (image 34, series 4). Adrenals/urinary tract: Adrenal glands and kidneys are normal. The ureters and bladder normal. Stomach/Bowel: Stomach, small bowel, appendix, and cecum are normal. The colon and rectosigmoid colon are normal. Vascular/Lymphatic: Abdominal aorta is normal caliber. There is no retroperitoneal or periportal lymphadenopathy. No pelvic lymphadenopathy. Reproductive: Post hysterectomy. Other: No peritoneal nodularity Musculoskeletal: No aggressive osseous lesion. Review of the MIP images confirms the above findings. IMPRESSION: Chest Impression: 1. No evidence acute pulmonary embolism. 2. Suspicious RIGHT upper lobe pulmonary nodule not changed in short interval from 03/11/2017. Abdomen / Pelvis Impression: 1. Interval decrease in size of pancreatic mass in short interval follow-up. 2. Stable bilobed hepatic metastasis. 3. No peritoneal metastatic disease or progressive adenopathy. Electronically Signed   By: Suzy Bouchard M.D.   On: 04/13/2017 17:55   Ct Abdomen Pelvis W Contrast  Result Date: 04/13/2017 CLINICAL DATA:  Pancreatic carcinoma. Referral cancer center for hypotension and malaise. Chills and nausea and vomiting. Chronic RIGHT-sided abdominal pain. EXAM: CT ANGIOGRAPHY CHEST CT ABDOMEN AND PELVIS WITH CONTRAST TECHNIQUE: Multidetector CT imaging of the chest was performed using the standard protocol during bolus administration of intravenous contrast. Multiplanar CT image reconstructions and MIPs were obtained to evaluate the vascular anatomy. Multidetector CT imaging of the abdomen and pelvis was performed using the standard protocol during bolus administration of intravenous contrast. CONTRAST:  155mL ISOVUE-370 IOPAMIDOL (ISOVUE-370) INJECTION 76% COMPARISON:  CT chest abdomen pelvis 03/11/2017 FINDINGS: CTA CHEST  FINDINGS Cardiovascular: No filling defects within the pulmonary arteries arteries to suggest acute pulmonary embolism. No significant vascular findings. Normal heart size. No pericardial effusion. Mediastinum/Nodes: No axillary supraclavicular adenopathy. Port in the RIGHT chest wall with tip in distal SVC. No mediastinal hilar adenopathy. No pericardial fluid. Esophagus normal. Lungs/Pleura: 6 mm RIGHT upper lobe pulmonary nodule is not changed compared to 03/11/2017. Lesions increased in size from 12/06/2016 no new pulmonary nodules. Musculoskeletal: No aggressive osseous lesion Review of the MIP images confirms the above findings. CT ABDOMEN and PELVIS FINDINGS Hepatobiliary: Multiple round low-density lesions in liver parenchyma consistent with hepatic metastasis are not changed in short interval from 03/11/2017. No new lesions. Postcholecystectomy. Pancreas: Lesion head of pancreas measures smaller at 3.7 by 4.9 cm compared to 4.5 x 5.2 cm. There is atrophy duct dilatation the body and tail the pancreas. Spleen: Geographic low attenuation within the medial aspect the spleen is likely a profusion phenomena (image 34, series 4). Adrenals/urinary tract: Adrenal glands and kidneys are normal. The ureters and bladder normal. Stomach/Bowel: Stomach, small bowel, appendix, and cecum are normal. The colon and rectosigmoid colon are normal. Vascular/Lymphatic: Abdominal aorta is normal caliber. There is no retroperitoneal or periportal lymphadenopathy. No pelvic lymphadenopathy. Reproductive: Post hysterectomy. Other: No peritoneal nodularity Musculoskeletal: No aggressive osseous lesion. Review of the MIP images confirms the  above findings. IMPRESSION: Chest Impression: 1. No evidence acute pulmonary embolism. 2. Suspicious RIGHT upper lobe pulmonary nodule not changed in short interval from 03/11/2017. Abdomen / Pelvis Impression: 1. Interval decrease in size of pancreatic mass in short interval follow-up. 2. Stable  bilobed hepatic metastasis. 3. No peritoneal metastatic disease or progressive adenopathy. Electronically Signed   By: Suzy Bouchard M.D.   On: 04/13/2017 17:55   Dg Abd Portable 1v  Result Date: 04/25/2017 CLINICAL DATA:  Abdominal pain EXAM: PORTABLE ABDOMEN - 1 VIEW COMPARISON:  CT abdomen and pelvis 04/13/2017 FINDINGS: Prior ventral hernia repair in pelvis. Surgical clips RIGHT upper quadrant related to prior cholecystectomy. Nonobstructive bowel gas pattern. No bowel dilatation or bowel wall thickening. Bones demineralized. Small pelvic phleboliths without definite urinary tract calcification. IMPRESSION: Normal bowel gas pattern. Electronically Signed   By: Lavonia Dana M.D.   On: 04/25/2017 15:37    Subjective: Reports feeling better after multiple bowel movements  Discharge Exam: Vitals:   04/25/17 2118 04/26/17 0542  BP: (!) 95/56 (!) 103/48  Pulse: 100 (!) 103  Resp: 18 18  Temp: (!) 97.5 F (36.4 C) 98.3 F (36.8 C)  SpO2: 96% 95%   Vitals:   04/25/17 1443 04/25/17 2058 04/25/17 2118 04/26/17 0542  BP: 116/78  (!) 95/56 (!) 103/48  Pulse: 98 (!) 116 100 (!) 103  Resp: 20 18 18 18   Temp: 97.6 F (36.4 C)  (!) 97.5 F (36.4 C) 98.3 F (36.8 C)  TempSrc: Oral  Oral Oral  SpO2: 100% 93% 96% 95%  Weight:      Height:        General: Pt is alert, awake, not in acute distress Cardiovascular: RRR, S1/S2 +, no rubs, no gallops Respiratory: CTA bilaterally, no wheezing, no rhonchi Abdominal: Soft, NT, ND, bowel sounds + Extremities: no edema, no cyanosis   The results of significant diagnostics from this hospitalization (including imaging, microbiology, ancillary and laboratory) are listed below for reference.     Microbiology: Recent Results (from the past 240 hour(s))  Culture, blood (Routine X 2) w Reflex to ID Panel     Status: None   Collection Time: 04/17/17 10:44 AM  Result Value Ref Range Status   Specimen Description BLOOD RIGHT ANTECUBITAL  Final    Special Requests   Final    BOTTLES DRAWN AEROBIC AND ANAEROBIC Blood Culture adequate volume   Culture   Final    NO GROWTH 5 DAYS Performed at [facility], [address], [place], [place] [address]    Report Status 04/22/2017 FINAL  Final  Culture, blood (Routine X 2) w Reflex to ID Panel     Status: None   Collection Time: 04/17/17 10:44 AM  Result Value Ref Range Status   Specimen Description BLOOD LEFT ANTECUBITAL  Final   Special Requests   Final    BOTTLES DRAWN AEROBIC AND ANAEROBIC Blood Culture adequate volume   Culture   Final    NO GROWTH 5 DAYS Performed at [facility], [place] [address] [place], [place] [address]    Report Status 04/22/2017 FINAL  Final  Culture, blood (routine x 2)     Status: None   Collection Time: 04/19/17  6:58 PM  Result Value Ref Range Status   Specimen Description BLOOD RIGHT ANTECUBITAL  Final   Special Requests   Final    BOTTLES DRAWN AEROBIC ONLY Blood Culture adequate volume   Culture   Final  NO GROWTH 5 DAYS Performed at [facility], [place] [address], [place], [place] [address]    Report Status 04/24/2017 FINAL  Final  Culture, blood (routine x 2)     Status: None   Collection Time: 04/19/17  7:04 PM  Result Value Ref Range Status   Specimen Description BLOOD RIGHT ANTECUBITAL  Final   Special Requests   Final    BOTTLES DRAWN AEROBIC AND ANAEROBIC Blood Culture adequate volume   Culture   Final    NO GROWTH 5 DAYS Performed at [facility], [place] [address], [place], [place] [address]    Report Status 04/24/2017 FINAL  Final  Culture, Urine     Status: None   Collection Time: 04/20/17  5:16 AM  Result Value Ref Range Status   Specimen Description URINE, RANDOM  Final   Special Requests Normal  Final   Culture   Final    NO GROWTH Performed at [facility], [place] [address] [place], [place] [address]    Report Status 04/21/2017 FINAL  Final  Culture, Blood     Status: None (Preliminary result)    Collection Time: 04/24/17  9:54 AM  Result Value Ref Range Status   Specimen Description   Final    BLOOD LEFT ARM Performed at [facility], [place] [address], [place], [place] [address]    Special Requests   Final    NONE Performed at [facility], [place] [address] [place], [place] [address]    Culture   Final    NO GROWTH 1 DAY Performed at [facility], [place] [address], [place], [place] [address]    Report Status PENDING  Incomplete  Culture, Blood     Status: None (Preliminary result)   Collection Time: 04/24/17 12:30 PM  Result Value Ref Range Status   Specimen Description   Final    PORTA CATH Performed at [facility], [place] [address] [place], [place] [address]    Special Requests   Final    BOTTLES DRAWN AEROBIC AND ANAEROBIC Blood Culture adequate volume   Culture   Final    NO GROWTH < 24 HOURS Performed at [facility], [place] [address] [place], [place] [address]    Report Status PENDING  Incomplete     Labs: BNP (last 3 results) No results for input(s): BNP in the last 8760 hours. Basic Metabolic Panel: Recent Labs  Lab 04/19/17 1858 04/21/17 0407 04/24/17 0750 04/26/17 0446  NA 131* 133* 135* 136  K 3.4* 3.6 4.2 3.8  CL 97* 101 99 102  CO2 24 26 23 26   GLUCOSE 166* 116* 139 120*  BUN 8 <5* 6* 5*  CREATININE 0.65 0.59  --  0.66  CALCIUM 8.8* 8.2* 10.0 9.1   Liver Function Tests: Recent Labs  Lab 04/19/17 1858 04/21/17 0407 04/24/17 0750  AST 23 26 24   ALT 19 19 20   ALKPHOS 173* 166* 218*  BILITOT 0.4 0.4 0.8  PROT 6.6 6.1* 7.1  ALBUMIN 2.8* 2.7* 2.8*   No results for input(s): LIPASE, AMYLASE in the last 168 hours. No results for input(s): AMMONIA in the last 168 hours. CBC: Recent Labs  Lab 04/19/17 1858 04/20/17 1429 04/24/17 0750 04/26/17 0446  WBC 9.5 11.2* 10.3 13.3*  NEUTROABS  --   --  7.4*  --   HGB 9.0* 8.5*  --  9.7*  HCT 28.2* 27.6*  34.1* 31.4*  MCV 85.5 87.3 86.1 86.7  PLT 114* 127* 184 187   Cardiac Enzymes: Recent Labs  Lab 04/19/17 1858 04/20/17 0002 04/20/17 0652  TROPONINI <0.03 <0.03 <0.03   BNP: Invalid input(s): POCBNP CBG: Recent Labs  Lab 04/20/17 0803  GLUCAP 140*   D-Dimer No results for input(s): DDIMER in the last 72 hours. Hgb A1c No results for input(s): HGBA1C in the last 72 hours. Lipid Profile No results for input(s): CHOL, HDL, LDLCALC, TRIG, CHOLHDL, LDLDIRECT in the last 72 hours. Thyroid function studies No results for input(s): TSH, T4TOTAL, T3FREE, THYROIDAB in the last 72 hours.  Invalid input(s): FREET3 Anemia work up No results for input(s): VITAMINB12, FOLATE, FERRITIN, TIBC, IRON, RETICCTPCT in the last 72 hours. Urinalysis    Component Value Date/Time   COLORURINE YELLOW 04/25/2017 0624   APPEARANCEUR CLEAR 04/25/2017 0624   LABSPEC 1.017 04/25/2017 0624   LABSPEC 1.005 03/28/2017 1319   PHURINE 6.0 04/25/2017 0624   GLUCOSEU NEGATIVE 04/25/2017 0624   GLUCOSEU Negative 03/28/2017 1319   HGBUR NEGATIVE 04/25/2017 0624   BILIRUBINUR NEGATIVE 04/25/2017 0624   BILIRUBINUR Negative 03/28/2017 1319   KETONESUR 20 (A) 04/25/2017 0624   PROTEINUR NEGATIVE 04/25/2017 0624   UROBILINOGEN 0.2 03/28/2017 1319   NITRITE NEGATIVE 04/25/2017 0624   LEUKOCYTESUR NEGATIVE 04/25/2017 0624   LEUKOCYTESUR Trace 03/28/2017 1319   Sepsis Labs Invalid input(s): PROCALCITONIN,  WBC,  LACTICIDVEN Microbiology Recent Results (from the past 240 hour(s))  Culture, blood (Routine X 2) w Reflex to ID Panel     Status: None   Collection Time: 04/17/17 10:44 AM  Result Value Ref Range Status   Specimen Description BLOOD RIGHT ANTECUBITAL  Final   Special Requests   Final    BOTTLES DRAWN AEROBIC AND ANAEROBIC Blood Culture adequate volume   Culture   Final    NO GROWTH 5 DAYS Performed at [facility], [place] [address] [place], [place] [address]    Report Status 04/22/2017  FINAL  Final  Culture, blood (Routine X 2) w Reflex to ID Panel     Status: None   Collection Time: 04/17/17 10:44 AM  Result Value Ref Range Status   Specimen Description BLOOD LEFT ANTECUBITAL  Final   Special Requests   Final    BOTTLES DRAWN AEROBIC AND ANAEROBIC Blood Culture adequate volume   Culture   Final    NO GROWTH 5 DAYS Performed at [facility], [place] [address] [place], [place] [address]    Report Status 04/22/2017 FINAL  Final  Culture, blood (routine x 2)     Status: None   Collection Time: 04/19/17  6:58 PM  Result Value Ref Range Status   Specimen Description BLOOD RIGHT ANTECUBITAL  Final   Special Requests   Final    BOTTLES DRAWN AEROBIC ONLY Blood Culture adequate volume   Culture   Final    NO GROWTH 5 DAYS Performed at [facility], [place] [address] [place], [place] [address]    Report Status 04/24/2017 FINAL  Final  Culture, blood (routine x 2)     Status: None   Collection Time: 04/19/17  7:04 PM  Result Value Ref Range Status   Specimen Description BLOOD RIGHT ANTECUBITAL  Final   Special Requests   Final    BOTTLES DRAWN AEROBIC AND ANAEROBIC Blood Culture adequate volume   Culture   Final    NO GROWTH 5 DAYS Performed at [facility],  [address] [place], [place] [address]    Report Status 04/24/2017 FINAL  Final  Culture, Urine     Status: None   Collection Time: 04/20/17  5:16 AM  Result Value Ref Range Status   Specimen Description URINE, RANDOM  Final   Special Requests Normal  Final   Culture   Final    NO GROWTH Performed at [facility], [place] [address] [place], [place] [address]    Report Status 04/21/2017 FINAL  Final  Culture, Blood     Status: None (Preliminary result)   Collection Time: 04/24/17  9:54 AM  Result Value Ref Range Status   Specimen Description   Final    BLOOD LEFT ARM Performed at [facility], [place] [address] [place], [place] [address]    Special Requests    Final    NONE Performed at [facility],  [address], [place], [place] [address]    Culture   Final    NO GROWTH 1 DAY Performed at [facility], [place] [address], [place], [place] [address]    Report Status PENDING  Incomplete  Culture, Blood     Status: None (Preliminary result)   Collection Time: 04/24/17 12:30 PM  Result Value Ref Range Status   Specimen Description   Final    PORTA CATH Performed at [facility], [place] [address] [place], [place] [address]    Special Requests   Final    BOTTLES DRAWN AEROBIC AND ANAEROBIC Blood Culture adequate volume   Culture   Final    NO GROWTH < 24 HOURS Performed at [facility], [place] [address], [place], [place] [address]    Report Status PENDING  Incomplete     SIGNED:   Marylu Lund, MD  [facility] 04/26/2017, 10:28 AM  If 7PM-7AM, please contact night-coverage [email or web address] Password [email or web address]

## 2017-04-26 NOTE — Progress Notes (Signed)
Pt discharged from the unit via wheelchair. Discharge instructions reviewed with patient and family member. No questions or concerns at this time.

## 2017-04-27 ENCOUNTER — Other Ambulatory Visit: Payer: Self-pay | Admitting: Oncology

## 2017-04-27 ENCOUNTER — Telehealth: Payer: Self-pay

## 2017-04-27 ENCOUNTER — Telehealth: Payer: Self-pay | Admitting: *Deleted

## 2017-04-27 DIAGNOSIS — C787 Secondary malignant neoplasm of liver and intrahepatic bile duct: Principal | ICD-10-CM

## 2017-04-27 DIAGNOSIS — M62830 Muscle spasm of back: Secondary | ICD-10-CM

## 2017-04-27 DIAGNOSIS — C259 Malignant neoplasm of pancreas, unspecified: Secondary | ICD-10-CM

## 2017-04-27 MED ORDER — OXYCODONE HCL 5 MG PO TABS: ORAL_TABLET | ORAL | 0 refills | Status: DC

## 2017-04-27 MED ORDER — HYDROMORPHONE HCL 4 MG PO TABS: 2.0000 mg | ORAL_TABLET | ORAL | 0 refills | Status: DC | PRN

## 2017-04-27 MED ORDER — MIDODRINE HCL 5 MG PO TABS: 5.0000 mg | ORAL_TABLET | Freq: Three times a day (TID) | ORAL | 0 refills | Status: DC

## 2017-04-27 MED ORDER — LORAZEPAM 1 MG PO TABS
1.0000 mg | ORAL_TABLET | Freq: Three times a day (TID) | ORAL | 0 refills | Status: DC | PRN
Start: 2017-04-27 — End: 2017-05-15

## 2017-04-27 MED ORDER — MORPHINE SULFATE ER 30 MG PO TBCR
30.0000 mg | EXTENDED_RELEASE_TABLET | Freq: Two times a day (BID) | ORAL | 0 refills | Status: DC
Start: 2017-04-27 — End: 2017-05-15

## 2017-04-27 NOTE — Telephone Encounter (Signed)
Spouse and patient called asking for patient to be "seen today for port-a-cath evaluation.  Discharged yesterday.  Today area where needle is inserted looks red.  No swelling but a tiny lump in the center.  No warmth, open areas or drainage.  No fever."    No redness beyond port site.  Advised to monitor temperature.  Spouse says Dr. Benay Spice previously has addressed port-cath.  Call transferred.

## 2017-04-27 NOTE — Telephone Encounter (Signed)
Spoke with pt regarding port-a-cath. Patient is agreeable with advice in triage message below. Pt will monitor temperature and call if any worsening symptoms present. Also called pt regarding medication refills. Pt requests refills be called in to pharmacy. Will consult with MD.   Heather Frederick with pharmacy, medications cannot be called in. Voiced that they already expressed that to the pt.  Also stated that pt has already had prescriptions filled at a different. Ativan refill called to pharmacy. Any other questions on medication locations to be directed to pharmacy. Pt voiced understanding.

## 2017-04-28 ENCOUNTER — Emergency Department (HOSPITAL_COMMUNITY): Payer: 59

## 2017-04-28 ENCOUNTER — Other Ambulatory Visit: Payer: Self-pay

## 2017-04-28 ENCOUNTER — Other Ambulatory Visit (HOSPITAL_COMMUNITY): Payer: Self-pay

## 2017-04-28 ENCOUNTER — Encounter (HOSPITAL_COMMUNITY): Payer: Self-pay | Admitting: Emergency Medicine

## 2017-04-28 ENCOUNTER — Inpatient Hospital Stay (HOSPITAL_COMMUNITY)
Admission: EM | Admit: 2017-04-28 | Discharge: 2017-05-01 | DRG: 641 | Disposition: A | Discharge status: Home / Self Care | Payer: 59 | Attending: Internal Medicine | Admitting: Internal Medicine

## 2017-04-28 DIAGNOSIS — E119 Type 2 diabetes mellitus without complications: Secondary | ICD-10-CM | POA: Diagnosis present

## 2017-04-28 DIAGNOSIS — Z888 Allergy status to other drugs, medicaments and biological substances status: Secondary | ICD-10-CM

## 2017-04-28 DIAGNOSIS — E785 Hyperlipidemia, unspecified: Secondary | ICD-10-CM | POA: Diagnosis present

## 2017-04-28 DIAGNOSIS — Z87440 Personal history of urinary (tract) infections: Secondary | ICD-10-CM

## 2017-04-28 DIAGNOSIS — M1990 Unspecified osteoarthritis, unspecified site: Secondary | ICD-10-CM | POA: Diagnosis present

## 2017-04-28 DIAGNOSIS — C787 Secondary malignant neoplasm of liver and intrahepatic bile duct: Secondary | ICD-10-CM | POA: Diagnosis present

## 2017-04-28 DIAGNOSIS — Z800 Family history of malignant neoplasm of digestive organs: Secondary | ICD-10-CM

## 2017-04-28 DIAGNOSIS — E860 Dehydration: Secondary | ICD-10-CM | POA: Diagnosis not present

## 2017-04-28 DIAGNOSIS — K219 Gastro-esophageal reflux disease without esophagitis: Secondary | ICD-10-CM | POA: Diagnosis present

## 2017-04-28 DIAGNOSIS — M545 Low back pain: Secondary | ICD-10-CM | POA: Diagnosis present

## 2017-04-28 DIAGNOSIS — R110 Nausea: Secondary | ICD-10-CM | POA: Diagnosis not present

## 2017-04-28 DIAGNOSIS — Z7901 Long term (current) use of anticoagulants: Secondary | ICD-10-CM

## 2017-04-28 DIAGNOSIS — Z803 Family history of malignant neoplasm of breast: Secondary | ICD-10-CM

## 2017-04-28 DIAGNOSIS — I7300 Raynaud's syndrome without gangrene: Secondary | ICD-10-CM | POA: Diagnosis present

## 2017-04-28 DIAGNOSIS — C259 Malignant neoplasm of pancreas, unspecified: Secondary | ICD-10-CM

## 2017-04-28 DIAGNOSIS — G8929 Other chronic pain: Secondary | ICD-10-CM | POA: Diagnosis present

## 2017-04-28 DIAGNOSIS — Z832 Family history of diseases of the blood and blood-forming organs and certain disorders involving the immune mechanism: Secondary | ICD-10-CM

## 2017-04-28 DIAGNOSIS — Z841 Family history of disorders of kidney and ureter: Secondary | ICD-10-CM

## 2017-04-28 DIAGNOSIS — Z882 Allergy status to sulfonamides status: Secondary | ICD-10-CM

## 2017-04-28 DIAGNOSIS — Z886 Allergy status to analgesic agent status: Secondary | ICD-10-CM

## 2017-04-28 DIAGNOSIS — Z86718 Personal history of other venous thrombosis and embolism: Secondary | ICD-10-CM

## 2017-04-28 DIAGNOSIS — Z96652 Presence of left artificial knee joint: Secondary | ICD-10-CM | POA: Diagnosis present

## 2017-04-28 DIAGNOSIS — R0602 Shortness of breath: Secondary | ICD-10-CM | POA: Diagnosis not present

## 2017-04-28 DIAGNOSIS — G4733 Obstructive sleep apnea (adult) (pediatric): Secondary | ICD-10-CM | POA: Diagnosis present

## 2017-04-28 DIAGNOSIS — K760 Fatty (change of) liver, not elsewhere classified: Secondary | ICD-10-CM | POA: Diagnosis present

## 2017-04-28 DIAGNOSIS — Z8249 Family history of ischemic heart disease and other diseases of the circulatory system: Secondary | ICD-10-CM

## 2017-04-28 DIAGNOSIS — H9190 Unspecified hearing loss, unspecified ear: Secondary | ICD-10-CM | POA: Diagnosis present

## 2017-04-28 DIAGNOSIS — Z794 Long term (current) use of insulin: Secondary | ICD-10-CM

## 2017-04-28 DIAGNOSIS — F419 Anxiety disorder, unspecified: Secondary | ICD-10-CM | POA: Diagnosis not present

## 2017-04-28 DIAGNOSIS — K5909 Other constipation: Secondary | ICD-10-CM | POA: Diagnosis present

## 2017-04-28 DIAGNOSIS — Z7982 Long term (current) use of aspirin: Secondary | ICD-10-CM

## 2017-04-28 DIAGNOSIS — R531 Weakness: Secondary | ICD-10-CM | POA: Diagnosis not present

## 2017-04-28 DIAGNOSIS — I10 Essential (primary) hypertension: Secondary | ICD-10-CM | POA: Diagnosis present

## 2017-04-28 DIAGNOSIS — F329 Major depressive disorder, single episode, unspecified: Secondary | ICD-10-CM

## 2017-04-28 DIAGNOSIS — Z9049 Acquired absence of other specified parts of digestive tract: Secondary | ICD-10-CM

## 2017-04-28 DIAGNOSIS — Z79891 Long term (current) use of opiate analgesic: Secondary | ICD-10-CM

## 2017-04-28 DIAGNOSIS — K5900 Constipation, unspecified: Secondary | ICD-10-CM | POA: Diagnosis present

## 2017-04-28 DIAGNOSIS — F32A Depression, unspecified: Secondary | ICD-10-CM

## 2017-04-28 DIAGNOSIS — R5381 Other malaise: Secondary | ICD-10-CM | POA: Diagnosis present

## 2017-04-28 DIAGNOSIS — C251 Malignant neoplasm of body of pancreas: Secondary | ICD-10-CM | POA: Diagnosis present

## 2017-04-28 DIAGNOSIS — R5081 Fever presenting with conditions classified elsewhere: Secondary | ICD-10-CM | POA: Diagnosis present

## 2017-04-28 DIAGNOSIS — Z823 Family history of stroke: Secondary | ICD-10-CM

## 2017-04-28 DIAGNOSIS — Z91030 Bee allergy status: Secondary | ICD-10-CM

## 2017-04-28 LAB — D-DIMER, QUANTITATIVE (NOT AT ARMC): D DIMER QUANT: 0.59 ug{FEU}/mL — AB (ref 0.00–0.50)

## 2017-04-28 LAB — GLUCOSE, CAPILLARY: GLUCOSE-CAPILLARY: 98 mg/dL (ref 65–99)

## 2017-04-28 LAB — URINALYSIS, ROUTINE W REFLEX MICROSCOPIC
Bacteria, UA: NONE SEEN
Bilirubin Urine: NEGATIVE
GLUCOSE, UA: NEGATIVE mg/dL
HGB URINE DIPSTICK: NEGATIVE
KETONES UR: NEGATIVE mg/dL
Leukocytes, UA: NEGATIVE
NITRITE: NEGATIVE
PH: 9 — AB (ref 5.0–8.0)
Protein, ur: 30 mg/dL — AB
Specific Gravity, Urine: 1.013 (ref 1.005–1.030)

## 2017-04-28 LAB — CBC
HCT: 27 % — ABNORMAL LOW (ref 36.0–46.0)
Hemoglobin: 8.8 g/dL — ABNORMAL LOW (ref 12.0–15.0)
MCH: 27.6 pg (ref 26.0–34.0)
MCHC: 32.6 g/dL (ref 30.0–36.0)
MCV: 84.6 fL (ref 78.0–100.0)
PLATELETS: 175 10*3/uL (ref 150–400)
RBC: 3.19 MIL/uL — AB (ref 3.87–5.11)
RDW: 19.2 % — AB (ref 11.5–15.5)
WBC: 12.3 10*3/uL — AB (ref 4.0–10.5)

## 2017-04-28 LAB — BASIC METABOLIC PANEL
Anion gap: 9 (ref 5–15)
BUN: 6 mg/dL (ref 6–20)
CALCIUM: 8.8 mg/dL — AB (ref 8.9–10.3)
CO2: 23 mmol/L (ref 22–32)
CREATININE: 0.53 mg/dL (ref 0.44–1.00)
Chloride: 100 mmol/L — ABNORMAL LOW (ref 101–111)
Glucose, Bld: 100 mg/dL — ABNORMAL HIGH (ref 65–99)
Potassium: 3.6 mmol/L (ref 3.5–5.1)
SODIUM: 132 mmol/L — AB (ref 135–145)

## 2017-04-28 LAB — CBG MONITORING, ED: Glucose-Capillary: 69 mg/dL (ref 65–99)

## 2017-04-28 MED ORDER — LIP MEDEX EX OINT
1.0000 "application " | TOPICAL_OINTMENT | CUTANEOUS | Status: DC | PRN
  Administered 2017-04-29: 1 via TOPICAL
  Filled 2017-04-28 (×2): qty 7

## 2017-04-28 MED ORDER — HYDROMORPHONE HCL 1 MG/ML IJ SOLN
2.0000 mg | INTRAMUSCULAR | Status: DC | PRN
  Administered 2017-04-28 – 2017-05-01 (×3): 2 mg via INTRAVENOUS
  Filled 2017-04-28 (×4): qty 2

## 2017-04-28 MED ORDER — ONDANSETRON HCL 4 MG/2ML IJ SOLN
4.0000 mg | Freq: Once | INTRAMUSCULAR | Status: AC
  Administered 2017-04-28: 4 mg via INTRAVENOUS
  Filled 2017-04-28: qty 2

## 2017-04-28 MED ORDER — INSULIN DETEMIR 100 UNIT/ML ~~LOC~~ SOLN
10.0000 [IU] | Freq: Every day | SUBCUTANEOUS | Status: DC
  Administered 2017-04-28 – 2017-04-30 (×3): 10 [IU] via SUBCUTANEOUS
  Filled 2017-04-28 (×4): qty 0.1

## 2017-04-28 MED ORDER — ELETRIPTAN HYDROBROMIDE 40 MG PO TABS
40.0000 mg | ORAL_TABLET | ORAL | Status: DC | PRN
  Filled 2017-04-28: qty 1

## 2017-04-28 MED ORDER — PREGABALIN 100 MG PO CAPS
200.0000 mg | ORAL_CAPSULE | Freq: Every day | ORAL | Status: DC
  Administered 2017-04-28 – 2017-05-01 (×4): 200 mg via ORAL
  Filled 2017-04-28 (×4): qty 2

## 2017-04-28 MED ORDER — POLYETHYLENE GLYCOL 3350 17 G PO PACK
17.0000 g | PACK | Freq: Two times a day (BID) | ORAL | Status: DC
  Administered 2017-04-28 – 2017-05-01 (×6): 17 g via ORAL
  Filled 2017-04-28 (×6): qty 1

## 2017-04-28 MED ORDER — CYCLOBENZAPRINE HCL 5 MG PO TABS: 5.0000 mg | ORAL_TABLET | Freq: Three times a day (TID) | ORAL | Status: DC | PRN

## 2017-04-28 MED ORDER — IOPAMIDOL (ISOVUE-300) INJECTION 61%
INTRAVENOUS | Status: AC
  Administered 2017-04-28: 80 mL via INTRAVENOUS
  Filled 2017-04-28: qty 100

## 2017-04-28 MED ORDER — RIVAROXABAN 20 MG PO TABS
20.0000 mg | ORAL_TABLET | Freq: Every day | ORAL | Status: DC
  Administered 2017-04-28 – 2017-05-01 (×4): 20 mg via ORAL
  Filled 2017-04-28 (×4): qty 1

## 2017-04-28 MED ORDER — SIMETHICONE 80 MG PO CHEW
80.0000 mg | CHEWABLE_TABLET | Freq: Four times a day (QID) | ORAL | Status: DC
  Administered 2017-04-28 – 2017-05-01 (×11): 80 mg via ORAL
  Filled 2017-04-28 (×11): qty 1

## 2017-04-28 MED ORDER — HYDROMORPHONE HCL 1 MG/ML IJ SOLN
0.5000 mg | Freq: Once | INTRAMUSCULAR | Status: AC
  Administered 2017-04-28: 0.5 mg via INTRAVENOUS
  Filled 2017-04-28: qty 1

## 2017-04-28 MED ORDER — IOPAMIDOL (ISOVUE-370) INJECTION 76%
INTRAVENOUS | Status: AC
  Administered 2017-04-28: 100 mL via INTRAVENOUS
  Filled 2017-04-28: qty 100

## 2017-04-28 MED ORDER — SODIUM CHLORIDE 0.9 % IV BOLUS (SEPSIS)
1000.0000 mL | Freq: Once | INTRAVENOUS | Status: AC
  Administered 2017-04-28: 1000 mL via INTRAVENOUS

## 2017-04-28 MED ORDER — SODIUM CHLORIDE 0.9% FLUSH
10.0000 mL | INTRAVENOUS | Status: DC | PRN
Start: 2017-04-28 — End: 2017-05-01
  Administered 2017-05-01: 10 mL
  Filled 2017-04-28: qty 40

## 2017-04-28 MED ORDER — CITALOPRAM HYDROBROMIDE 20 MG PO TABS
20.0000 mg | ORAL_TABLET | Freq: Every day | ORAL | Status: DC
  Administered 2017-04-29 – 2017-05-01 (×3): 20 mg via ORAL
  Filled 2017-04-28 (×3): qty 1

## 2017-04-28 MED ORDER — MORPHINE SULFATE ER 30 MG PO TBCR
30.0000 mg | EXTENDED_RELEASE_TABLET | Freq: Two times a day (BID) | ORAL | Status: DC
  Administered 2017-04-28 – 2017-05-01 (×6): 30 mg via ORAL
  Filled 2017-04-28 (×7): qty 1

## 2017-04-28 MED ORDER — ACETAMINOPHEN 650 MG RE SUPP: 650.0000 mg | Freq: Four times a day (QID) | RECTAL | Status: DC | PRN

## 2017-04-28 MED ORDER — NITROGLYCERIN 0.4 MG SL SUBL: 0.4000 mg | SUBLINGUAL_TABLET | SUBLINGUAL | Status: DC | PRN

## 2017-04-28 MED ORDER — ACETAMINOPHEN 325 MG PO TABS
650.0000 mg | ORAL_TABLET | Freq: Four times a day (QID) | ORAL | Status: DC | PRN
  Administered 2017-04-29: 650 mg via ORAL
  Filled 2017-04-28: qty 2

## 2017-04-28 MED ORDER — MIDODRINE HCL 5 MG PO TABS
5.0000 mg | ORAL_TABLET | Freq: Three times a day (TID) | ORAL | Status: DC
  Administered 2017-04-29 – 2017-05-01 (×8): 5 mg via ORAL
  Filled 2017-04-28 (×9): qty 1

## 2017-04-28 MED ORDER — ASPIRIN EC 81 MG PO TBEC
81.0000 mg | DELAYED_RELEASE_TABLET | Freq: Every day | ORAL | Status: DC
  Administered 2017-04-29 – 2017-05-01 (×3): 81 mg via ORAL
  Filled 2017-04-28 (×3): qty 1

## 2017-04-28 MED ORDER — POTASSIUM CHLORIDE IN NACL 20-0.9 MEQ/L-% IV SOLN
INTRAVENOUS | Status: DC
  Administered 2017-04-28 – 2017-04-30 (×4): via INTRAVENOUS
  Filled 2017-04-28 (×6): qty 1000

## 2017-04-28 MED ORDER — LORAZEPAM 1 MG PO TABS
1.0000 mg | ORAL_TABLET | Freq: Three times a day (TID) | ORAL | Status: DC | PRN
  Administered 2017-04-28: 1 mg via ORAL
  Filled 2017-04-28: qty 1

## 2017-04-28 MED ORDER — INSULIN ASPART 100 UNIT/ML ~~LOC~~ SOLN: 0.0000 [IU] | Freq: Every day | SUBCUTANEOUS | Status: DC

## 2017-04-28 MED ORDER — OXYBUTYNIN CHLORIDE ER 5 MG PO TB24
10.0000 mg | ORAL_TABLET | Freq: Every morning | ORAL | Status: DC
  Administered 2017-04-29 – 2017-05-01 (×3): 10 mg via ORAL
  Filled 2017-04-28 (×3): qty 2

## 2017-04-28 MED ORDER — ONDANSETRON HCL 4 MG/2ML IJ SOLN
4.0000 mg | Freq: Four times a day (QID) | INTRAMUSCULAR | Status: DC | PRN
Start: 2017-04-28 — End: 2017-05-01
  Administered 2017-04-28 – 2017-04-29 (×2): 4 mg via INTRAVENOUS
  Filled 2017-04-28 (×2): qty 2

## 2017-04-28 MED ORDER — INSULIN ASPART 100 UNIT/ML ~~LOC~~ SOLN
0.0000 [IU] | Freq: Three times a day (TID) | SUBCUTANEOUS | Status: DC
  Administered 2017-04-29: 2 [IU] via SUBCUTANEOUS
  Administered 2017-04-30: 3 [IU] via SUBCUTANEOUS
  Administered 2017-04-30: 5 [IU] via SUBCUTANEOUS

## 2017-04-28 MED ORDER — POLYETHYLENE GLYCOL 3350 17 G PO PACK: 17.0000 g | PACK | Freq: Every day | ORAL | Status: DC | PRN

## 2017-04-28 MED ORDER — ORAL CARE MOUTH RINSE
15.0000 mL | Freq: Two times a day (BID) | OROMUCOSAL | Status: DC
  Administered 2017-04-29 – 2017-04-30 (×3): 15 mL via OROMUCOSAL

## 2017-04-28 MED ORDER — HYDROCODONE-ACETAMINOPHEN 5-325 MG PO TABS
1.0000 | ORAL_TABLET | Freq: Four times a day (QID) | ORAL | Status: DC | PRN
  Administered 2017-04-28 – 2017-04-30 (×2): 2 via ORAL
  Filled 2017-04-28 (×2): qty 2

## 2017-04-28 MED ORDER — ONDANSETRON HCL 4 MG PO TABS: 4.0000 mg | ORAL_TABLET | Freq: Four times a day (QID) | ORAL | Status: DC | PRN

## 2017-04-28 MED ORDER — PANTOPRAZOLE SODIUM 40 MG PO TBEC
40.0000 mg | DELAYED_RELEASE_TABLET | Freq: Every day | ORAL | Status: DC
  Administered 2017-04-28 – 2017-05-01 (×4): 40 mg via ORAL
  Filled 2017-04-28 (×4): qty 1

## 2017-04-28 NOTE — ED Triage Notes (Addendum)
Patient here from home with complaints of weakness and SOB that started this morning. Reports that she has been seen here multiple times for same. Hx of pancreatic cancer with mets. Last chemo treatment 1/16, pump still active infusing in port. Chills, nausea.

## 2017-04-28 NOTE — ED Notes (Signed)
Patient reports "I am not nauseous right now."

## 2017-04-28 NOTE — ED Notes (Signed)
XR at bedside

## 2017-04-28 NOTE — ED Provider Notes (Signed)
[facility] DEPT Provider Note   CSN: [id number] Arrival date & time: 04/28/17  1235     History   Chief Complaint Chief Complaint  Patient presents with  . Weakness  . Shortness of Breath    HPI Heather Frederick is a 62 y.o. female with PMH/o Pancreatic cancer with mets to liver who is currently undergoing chemo who presents for evaluation of nausea, lightheadedness, fatigue, and SOB that began last night. Husband who is accompanying patient states that symptoms worsened this AM prompting ED visit. Husband reports that patient has had several similar episodes of symptoms in the last weeks that have required admission for IV hydration and pain control. Husband reports that patient will usually experience similar symptoms after chemo session but states that patient's last chemo session was 04/11/17. Patient states she is very weak and fatigued "and feels like she's going to pass out or fall over if she gets up." Additionally, she reports that the SOB and nausea are worsened with exertion and activity. She reports decreased PO secondary to nausea. She was able to get some food down last night but did not have much of an appetite and has not been tolerating much PO. She denies any vomiting. Patient reports she has not had a bowel movement in the last 5 days. She does not recall if she is still passing flatus. She took Miralax last night with no improvement. She denies any abdominal pain. Patient denies any fever, CP, cough, abdominal pain, dysuria, hematuria, diarrhea, vomiting.   The history is provided by the patient.    Past Medical History:  Diagnosis Date  . Allergy   . Anxiety   . Arthritis   . Benign essential HTN 09/23/2014  . Cancer ([place])   . Chronic kidney disease    uti  . Depression   . Dyslipidemia   . Elevated liver enzymes   . Family history of anesthesia complication    father has a severe hard time waking up  . Gallstones    a. Seen on CT  01/2014.  Marland Kitchen GERD (gastroesophageal reflux disease)   . Hard of hearing   . Hepatic steatosis   . History of frequent urinary tract infections   . Hyperlipidemia   . Hypertension   . Lateral epicondylitis of right elbow   . Mental disorder   . Meralgia paresthetica of right side 10/02/2012   slight at 05/2014  . Migraine headache   . Obesity   . OSA (obstructive sleep apnea)    severe with AHI 37/hr now on CPAP at 18cm H2O  . Osteoarthritis   . Pancreatic cancer metastasized to liver ([place])   . Pneumonia    Feb 2018  . Raynaud disease    in feet per patient   . Sepsis ([place]) 02/23/2017  . Sleep apnea    wears c-pap  . Urinary tract infection     Patient Active Problem List   Diagnosis Date Noted  . Physical deconditioning 04/28/2017  . Dehydration 04/28/2017  . Anxiety and depression 04/28/2017  . Chronic pain 04/24/2017  . Diaphoresis 04/24/2017  . Generalized weakness 04/24/2017  . Dyspnea   . Anemia 04/19/2017  . Thrombocytopenia ([place]) 04/19/2017  . Leukocytosis 04/19/2017  . History of Dvt femoral (deep venous thrombosis) ([place]) 04/14/2017  . Anemia associated with chemotherapy 04/14/2017  . Hyponatremia 04/14/2017  . Pulmonary nodule, right 04/14/2017  . SIRS (systemic inflammatory response syndrome) ([place]) 04/13/2017  . Orthostatic hypotension 03/15/2017  .  Pain 03/12/2017  . Abdominal pain, generalized 03/12/2017  . Abdominal pain 03/12/2017  . Sepsis ([place]) 02/24/2017  . Chronic anticoagulation 02/23/2017  . Fever 02/23/2017  . Dizziness 02/05/2017  . Portacath in place 10/18/2016  . Goals of care, counseling/discussion 09/08/2016  . Pancreatic carcinoma metastatic to liver ([place]) 08/29/2016  . Mid back pain 07/07/2016  . Incisional hernia 06/30/2016  . Nausea and vomiting 05/06/2016  . Edema extremities 03/07/2016  . Pain of upper abdomen 01/27/2016  . Depression 01/28/2015  . Bile duct stone   . Elevated LFTs   . Benign essential HTN 09/23/2014  .  Routine general medical examination at a health care facility 08/01/2014  . OSA (obstructive sleep apnea) 07/04/2014  . Dyslipidemia 03/09/2014  . Diastolic heart failure, stage B ([place])   . GERD (gastroesophageal reflux disease)   . Meralgia paresthetica of right side 10/02/2012  . Osteoarthritis of left knee 09/12/2012  . Migraines 09/12/2012  . Obesity, Class III, BMI 40-49.9 (morbid obesity) ([place]) 09/12/2012    Past Surgical History:  Procedure Laterality Date  . ABDOMINAL HYSTERECTOMY  1/04   partial  . BLADDER SUSPENSION  6/10  . CARDIAC CATHETERIZATION    . carpel tunnel left/right  7/08, 8/08 Bilateral 8/08 and 7/08  . carpel tunnel rel Right 4/12  . CHOLECYSTECTOMY N/A 06/11/2014   Procedure: LAPAROSCOPIC CHOLECYSTECTOMY WITH ATTEMPTED INTRAOPERATIVE CHOLANGIOGRAM;  Surgeon: Jackolyn Confer, MD;  Location: [facility];  Service: General;  Laterality: N/A;  . COLONOSCOPY    . ERCP N/A 10/19/2014   Procedure: ENDOSCOPIC RETROGRADE CHOLANGIOPANCREATOGRAPHY (ERCP);  Surgeon: Ladene Artist, MD;  Location: [facility];  Service: Endoscopy;  Laterality: N/A;  . INCISIONAL HERNIA REPAIR N/A 06/30/2016   Procedure: LAPAROSCOPIC REPAIR OF INCISIONAL HERNIA WITH MESH;  Surgeon: Jackolyn Confer, MD;  Location: [facility];  Service: General;  Laterality: N/A;  . INSERTION OF MESH N/A 06/30/2016   Procedure: INSERTION OF MESH;  Surgeon: Jackolyn Confer, MD;  Location: [facility];  Service: General;  Laterality: N/A;  . IR CV LINE INJECTION  11/24/2016  . IR FLUORO GUIDE PORT INSERTION RIGHT  08/28/2016  . IR US GUIDE VASC ACCESS RIGHT  08/28/2016  . JOINT REPLACEMENT    . KNEE ARTHROSCOPY Left 12/12  . KNEE ARTHROSCOPY Right 12/06  . LEFT HEART CATHETERIZATION WITH CORONARY ANGIOGRAM N/A 03/10/2014   Procedure: LEFT HEART CATHETERIZATION WITH CORONARY ANGIOGRAM;  Surgeon: Sinclair Grooms, MD;  Location: [facility] CATH LAB;  Service: Cardiovascular;  Laterality: N/A;  . PLANTAR FASCIA RELEASE Right 12/10  .  radial tunnel release     right arm   . ROTATOR CUFF REPAIR Left 6/11  . tennis elbow release Right 7/04  . TOTAL KNEE ARTHROPLASTY Left 09/10/2012   Procedure: TOTAL KNEE ARTHROPLASTY- left;  Surgeon: Garald Balding, MD;  Location: [facility];  Service: Orthopedics;  Laterality: Left;  Left total knee arthroplasty    OB History    No data available       Home Medications    Prior to Admission medications   Medication Sig Start Date End Date Taking? Authorizing Provider  aspirin EC 81 MG tablet Take 81 mg by mouth daily.   Yes [provider]  citalopram (CELEXA) 20 MG tablet TAKE ONE (1) TABLET BY MOUTH EVERY DAY Patient taking differently: Take 20mg  by mouth daily 01/22/17  Yes Hoyt Koch, MD  Cranberry-Vitamin C-Vitamin E (CRANBERRY PLUS VITAMIN C PO) Take 1 tablet by mouth daily. 4200 mg  Yes [provider]  cyclobenzaprine (FLEXERIL) 5 MG tablet TAKE ONE (1) TABLET BY MOUTH 3 TIMES DAILY AS NEEDED FOR MUSCLE SPASMS Patient taking differently: Take 5mg  by mouth as needed for muscle spams 04/19/17  Yes Ladell Pier, MD  eletriptan (RELPAX) 40 MG tablet Take 1 tablet (40 mg total) by mouth every 2 (two) hours as needed for migraine. 06/26/16  Yes Dennie Bible, NP  HYDROcodone-acetaminophen (NORCO/VICODIN) 5-325 MG tablet Take 1 tablet by mouth every 6 (six) hours as needed for moderate pain.   Yes [provider]  HYDROmorphone (DILAUDID) 4 MG tablet Take 0.5 tablets (2 mg total) by mouth every 4 (four) hours as needed for severe pain. 04/27/17  Yes Ladell Pier, MD  insulin detemir (LEVEMIR) 100 unit/ml SOLN Inject 10 Units into the skin at bedtime.   Yes [provider]  insulin lispro (HUMALOG) 100 UNIT/ML injection Inject 2-10 Units into the skin. Sliding Scale   Yes [provider]  lidocaine-prilocaine (EMLA) cream APPLY 1 APPLICATION TOPICALLY AS NEEDED.APPLY TO PORT A CATH SITE 1 HOUR PRIOR TO NEEDLE STICK.  03/26/17  Yes Ladell Pier, MD  LORazepam (ATIVAN) 1 MG tablet Take 1 tablet (1 mg total) by mouth every 8 (eight) hours as needed for anxiety or sedation (or nausea). 04/27/17  Yes Ladell Pier, MD  LYRICA 200 MG capsule Take 200 mg by mouth daily. 04/03/17  Yes [provider]  midodrine (PROAMATINE) 5 MG tablet Take 1 tablet (5 mg total) by mouth 3 (three) times daily with meals. 04/27/17  Yes Ladell Pier, MD  morphine (MS CONTIN) 30 MG 12 hr tablet Take 1 tablet (30 mg total) by mouth every 12 (twelve) hours. 04/27/17  Yes Ladell Pier, MD  multivitamin-lutein [facility]) CAPS capsule Take 1 capsule by mouth daily.   Yes [provider]  oxybutynin (DITROPAN-XL) 10 MG 24 hr tablet Take 1 tablet (10 mg total) by mouth every morning. 03/19/17  Yes Ladell Pier, MD  pantoprazole (PROTONIX) 40 MG tablet TAKE ONE (1) TABLET BY MOUTH TWO (2) TIMES DAILY Patient taking differently: Take 40mg  by mouth twice daily 09/25/16  Yes Ladene Artist, MD  prochlorperazine (COMPAZINE) 10 MG tablet Take 10 mg by mouth every 6 (six) hours as needed for nausea or vomiting.   Yes [provider]  simethicone (MYLICON) 657 MG chewable tablet Chew 125-250 mg by mouth 2 (two) times daily as needed for flatulence.   Yes [provider]  vitamin B-12 (CYANOCOBALAMIN) 1000 MCG tablet Take 2,500 mcg by mouth every morning.   Yes [provider]  XARELTO 20 MG TABS tablet Take 20 mg daily by mouth. 01/31/17  Yes [provider]  nitroGLYCERIN (NITROSTAT) 0.4 MG SL tablet Place 1 tablet (0.4 mg total) under the tongue every 5 (five) minutes as needed for chest pain. 06/28/15   Belva Crome, MD  ondansetron (ZOFRAN-ODT) 8 MG disintegrating tablet Take 1 tablet (8 mg total) by mouth every 8 (eight) hours as needed for nausea or vomiting. Patient not taking: Reported on 04/28/2017 09/18/16   Maryanna Shape, NP  oxyCODONE (OXY IR/ROXICODONE) 5 MG immediate  release tablet TAKE 1 TABLET BY MOUTH EVERY 4 HOURS AS NEEDED FOR SEVERE PAIN Patient not taking: Reported on 04/28/2017 04/27/17   Ladell Pier, MD  QC MAGNESIUM CITRATE 1.745 GM/30ML SOLN Take 296 mLs by mouth once. 04/27/17   [provider]  sorbitol 70 % solution Take  30 mLs by mouth daily as needed (for constipation). 04/26/17   Donne Hazel, MD    Family History Family History  Problem Relation Age of Onset  . Hypertension Mother   . Stroke Mother   . Liver disease Mother        Abcess  . Hypertension Father   . Coronary artery disease Father   . Migraines Father   . Clotting disorder Father   . Kidney failure Brother   . Hypertension Brother   . Migraines Brother   . Migraines Daughter   . Breast cancer Other        Niece with breast cancer  . Colon cancer Paternal Grandmother   . Pancreatic cancer Paternal Grandmother   . Stomach cancer Paternal Grandmother   . Breast cancer Cousin   . Esophageal cancer Neg Hx   . Rectal cancer Neg Hx     Social History Social History   Tobacco Use  . Smoking status: Never Smoker  . Smokeless tobacco: Never Used  . Tobacco comment: Secondhand - from family growing up, workplace intermittently  Substance Use Topics  . Alcohol use: Yes    Alcohol/week: 0.0 oz    Comment: occasionally - intermittent, no more than twice a week  . Drug use: No     Allergies   Topamax [topiramate]; Aleve [naproxen sodium]; Bee venom; Echinacea; Other; Sulfa antibiotics; and Advil [ibuprofen]   Review of Systems Review of Systems  Constitutional: Positive for fatigue. Negative for chills and fever.  Respiratory: Positive for shortness of breath. Negative for cough.   Cardiovascular: Negative for chest pain.  Gastrointestinal: Positive for nausea. Negative for abdominal pain, diarrhea and vomiting.  Genitourinary: Negative for dysuria and hematuria.  Musculoskeletal: Negative for back pain and neck pain.  Skin: Negative for rash.    Neurological: Positive for weakness and light-headedness. Negative for dizziness, numbness and headaches.  All other systems reviewed and are negative.    Physical Exam Updated Vital Signs BP (!) 118/54   Pulse (!) 103   Temp 98 F (36.7 C) (Oral)   Resp 20   SpO2 95%   Physical Exam  Constitutional: She is oriented to person, place, and time. She appears well-developed and well-nourished.  Appears uncomfortable  HENT:  Head: Normocephalic and atraumatic.  Mouth/Throat: Oropharynx is clear and moist. Mucous membranes are dry.  Eyes: Conjunctivae, EOM and lids are normal. Pupils are equal, round, and reactive to light.  Neck: Full passive range of motion without pain.  Cardiovascular: Normal rate, regular rhythm, normal heart sounds and normal pulses. Exam reveals no gallop and no friction rub.  No murmur heard. Pulmonary/Chest: Effort normal and breath sounds normal.  Speaking in short sentences. Port noted to right anterior chest. No surrounding warmth, erythema.   Abdominal: Soft. Normal appearance. There is generalized tenderness. There is no rigidity, no guarding and no tenderness at McBurney's point.  Abdomen is soft, non-distended.  No rigidity, guarding.  No peritoneal signs.  Musculoskeletal: Normal range of motion.  BLE are symmetric in appearance. No evidence of erythema or edema.   Neurological: She is alert and oriented to person, place, and time.  Follows commands Moves all extremities.   Skin: Skin is warm and dry. Capillary refill takes less than 2 seconds.  Psychiatric: She has a normal mood and affect. Her speech is normal.  Nursing note and vitals reviewed.    ED Treatments / Results  Labs (all labs ordered are listed, but only abnormal results  are displayed) Labs Reviewed  BASIC METABOLIC PANEL - Abnormal; Notable for the following components:      Result Value   Sodium 132 (*)    Chloride 100 (*)    Glucose, Bld 100 (*)    Calcium 8.8 (*)    All  other components within normal limits  CBC - Abnormal; Notable for the following components:   WBC 12.3 (*)    RBC 3.19 (*)    Hemoglobin 8.8 (*)    HCT 27.0 (*)    RDW 19.2 (*)    All other components within normal limits  URINALYSIS, ROUTINE W REFLEX MICROSCOPIC - Abnormal; Notable for the following components:   pH 9.0 (*)    Protein, ur 30 (*)    Squamous Epithelial / LPF 0-5 (*)    All other components within normal limits  D-DIMER, QUANTITATIVE (NOT AT [facility]) - Abnormal; Notable for the following components:   D-Dimer, Quant 0.59 (*)    All other components within normal limits  CBG MONITORING, ED    EKG  EKG Interpretation None       Radiology Ct Angio Chest Pe W And/or Wo Contrast  Result Date: 04/28/2017 CLINICAL DATA:  Weakness and shortness of breath. History of pancreatic cancer with metastatic disease. EXAM: CT ANGIOGRAPHY CHEST WITH CONTRAST TECHNIQUE: Multidetector CT imaging of the chest was performed using the standard protocol during bolus administration of intravenous contrast. Multiplanar CT image reconstructions and MIPs were obtained to evaluate the vascular anatomy. CONTRAST:  100 mL ISOVUE-370 IOPAMIDOL (ISOVUE-370) INJECTION 76% COMPARISON:  04/13/2017 CT chest. FINDINGS: Cardiovascular: Satisfactory opacification of the pulmonary arteries to the segmental level. No evidence of pulmonary embolism. Normal heart size. No pericardial effusion. Mediastinum/Nodes: No enlarged mediastinal, hilar, or axillary lymph nodes. Thyroid gland, trachea, and esophagus demonstrate no significant findings. Port-A-Cath good position. Lungs/Pleura: Unchanged 6 mm RIGHT upper lobe nodule. Upper Abdomen: Hepatic metastatic disease appears similar to priors. Pancreatic head and body mass redemonstrated. Musculoskeletal: No chest wall abnormality. No acute or significant osseous findings. Review of the MIP images confirms the above findings. IMPRESSION: No evidence for pulmonary emboli.  No acute cardiopulmonary findings. Hepatic metastatic disease from pancreatic cancer. Study is insufficient characterize for improvement or progression. Electronically Signed   By: Staci Righter M.D.   On: 04/28/2017 15:51   Ct Abdomen Pelvis W Contrast  Result Date: 04/28/2017 CLINICAL DATA:  Progressive abdominal pain with history of pancreatic cancer. EXAM: CT ABDOMEN AND PELVIS WITH CONTRAST TECHNIQUE: Multidetector CT imaging of the abdomen and pelvis was performed using the standard protocol following bolus administration of intravenous contrast. CONTRAST:  80 cc Isovue 300 intravenously. COMPARISON:  Abdominal CT 04/13/2017 FINDINGS: Lower chest: Atelectatic changes in the right lung base. Partially visualized 6 mm right upper lobe pulmonary nodule. Hepatobiliary: No significant change in numerous bilateral hepatic metastatic lesions. Pancreas: Decreased in size hypoattenuated body of the pancreas malignancy, now measuring approximately 4.0 by 2.5 cm. Persistent dilation of the downstream main pancreatic duct and atrophy of the tail of the pancreas. Persistent peripancreatic fat stranding. Small peripancreatic lymph nodes appear stable. Spleen: Normal in size without focal abnormality. Adrenals/Urinary Tract: Adrenal glands are unremarkable. Kidneys are normal, without renal calculi, focal lesion, or hydronephrosis. Bladder is unremarkable. Stomach/Bowel: Stomach is within normal limits. Appendix appears normal. No evidence of bowel wall thickening, distention, or inflammatory changes. Vascular/Lymphatic: Mild upper abdominal retroperitoneal lymphadenopathy. No significant vascular findings. Reproductive: Status post hysterectomy. No adnexal masses. Other: No abdominal wall hernia or  abnormality. No abdominopelvic ascites. Prior ventral hernia repair. Musculoskeletal: No acute or significant osseous findings. IMPRESSION: Suboptimal timing of contrast administration. Interval mild decrease in size of  pancreatic body malignancy. Mild upper abdominal retroperitoneal lymphadenopathy. No significant change in widespread hepatic metastatic disease. Electronically Signed   By: Fidela Salisbury M.D.   On: 04/28/2017 17:39   Dg Abd Portable 2 Views  Result Date: 04/28/2017 CLINICAL DATA:  Vomiting EXAM: PORTABLE ABDOMEN - 2 VIEW COMPARISON:  04/25/2017 FINDINGS: The bowel gas pattern is normal. There is no evidence of free air. No radio-opaque calculi or other significant radiographic abnormality is seen. IMPRESSION: Negative. Electronically Signed   By: Kathreen Devoid   On: 04/28/2017 14:17    Procedures Procedures (including critical care time)  Medications Ordered in ED Medications  sodium chloride 0.9 % bolus 1,000 mL (0 mLs Intravenous Stopped 04/28/17 1605)  ondansetron (ZOFRAN) injection 4 mg (4 mg Intravenous Given 04/28/17 1706)  iopamidol (ISOVUE-370) 76 % injection (100 mLs Intravenous Contrast Given 04/28/17 1513)  HYDROmorphone (DILAUDID) injection 0.5 mg (0.5 mg Intravenous Given 04/28/17 1706)  iopamidol (ISOVUE-300) 61 % injection (80 mLs Intravenous Contrast Given 04/28/17 1651)  sodium chloride 0.9 % bolus 1,000 mL (1,000 mLs Intravenous New Bag/Given 04/28/17 1813)     Initial Impression / Assessment and Plan / ED Course  I have reviewed the triage vital signs and the nursing notes.  Pertinent labs & imaging results that were available during my care of the patient were reviewed by me and considered in my medical decision making (see chart for details).     62 y.o. female with past medical history of stage IV pancreatic cancer with metastatic disease to the liver who presents for evaluation of shortness of breath, nausea, fatigue, generalized weakness and lightheadedness that began last night.  Patient reports that she has had several episodes of similar symptoms after most recent chemo treatment on 04/11/17.  Patient reports that usually she has one episode after chemo and is able to  tolerate p.o. at home without difficulty.  Patient was recently admitted on 04/24/17 for similar symptoms.  She was discharged on 04/25/17.  At the time of discharge, patient states that she was feeling better was able to tolerate p.o. without any difficulty.  Patient denies any fever. Patient is afebrile, non-toxic appearing, sitting comfortably on examination table. Vital signs reviewed and stable.  No evidence of tachycardia or hypotension.  Given lack of febrile and vitals, do not suspect sepsis at this point.  On exam, patient does not exhibit any tremors swelling.  She appears uncomfortable.  She has generalized abdominal tenderness with no focal point.  Lungs clear to auscultation.  Concern for dehydration versus acute infectious etiology.  We will plan to check basic labs.  Plan to give patient antiemetics and analgesics.  Labs reviewed.  CBC shows slight leukocytosis of 12.3.  CBC also shows anemia but records reviewed to this is consistent with previous.  UA is negative for any acute infectious signs.  D-dimer is positive.  BMP shows sodium of 132.  BUN and creatinine stable.  Given positive d-dimer and patient's cancer history, will plan to get CTA for evaluation of PE.  Additionally, since patient is having some generalized abdominal tenderness, will plan for CT abdomen pelvis.  CTA is negative for any acute infectious etiology or PE.  CT abdomen pelvis is unremarkable.  Discussed results with patient.  Patient states that she does not feel like eating.  Encourage p.o. intake.  Patient is able to take a few sips of water but then does not feel like she can eat or drink anything else.  Review of vital signs show that patient is still tachycardic and slightly hypotensive.  She has gotten 2 L of fluid and still remains slightly tachycardic.  Concern for worsening dehydration and inability to tolerate p.o.  Given patient's history, will likely require admission for observation IV fluids.  Discussed with  Dr. Alisia Ferrari (hospitalist). Will admit patient.   Final Clinical Impressions(s) / ED Diagnoses   Final diagnoses:  Shortness of breath  Nausea  Generalized weakness    ED Discharge Orders    None       Desma Mcgregor 04/28/17 2055    Gareth Morgan, MD 04/29/17 2312

## 2017-04-28 NOTE — ED Notes (Signed)
Patient reports she is unable to sit or stand for orthostatic vital signs at this time.

## 2017-04-28 NOTE — ED Notes (Signed)
ED Provider at bedside. 

## 2017-04-28 NOTE — ED Notes (Signed)
Patient transported to CT 

## 2017-04-28 NOTE — ED Notes (Signed)
ED PA at bedside

## 2017-04-28 NOTE — H&P (Signed)
PCP:   [name], MD   Chief Complaint:  Generalized weakness   HPI: This is a 62 year old female with a history of pancreatic cancer since April 2019.  She is on chemotherapy every 2 weeks.  She was started on a more aggressive course of chemotherapy approximately 2-3 months ago.  Her last chemotherapy treatment was ended on the 16th.  Since then she has had several intermittent episodes of severe weakness, tiredness, nausea and dizzyness.  This be her third admission for the same.  Last night she began feeling extremely tired and weak.  She also felt nauseous but did not vomit.  At approximately 2 AM she woke up she was diaphoretic.  When get up to go to restroom she was very weak and very lightheaded.  This continued to worsen the course of the day.  She had low-grade fevers Tmax 99.8.  They finally called her oncologist who directed them to come to the ER.  In the ER she is weak and is unable to hold down much p.o. intake.  Her lips are dry she is occasionally lightheaded.  The hospitalist have been asked to admit.  History provided mainly by her husband who is present at bedside.  The patient is alert and oriented   Review of Systems:  The patient denies anorexia, fever, weight loss,, vision loss, decreased hearing, hoarseness, chest pain, syncope, dyspnea on exertion, peripheral edema, balance deficits, hemoptysis, abdominal pain, melena, hematochezia, severe indigestion/heartburn, hematuria, incontinence, genital sores, generalized weakness, suspicious skin lesions, transient blindness, difficulty walking, depression, unusual weight change, abnormal bleeding, enlarged lymph nodes, angioedema, and breast masses.  Past Medical History: Past Medical History:  Diagnosis Date  . Allergy   . Anxiety   . Arthritis   . Benign essential HTN 09/23/2014  . Cancer ([place])   . Chronic kidney disease    uti  . Depression   . Dyslipidemia   . Elevated liver enzymes   . Family history of anesthesia  complication    father has a severe hard time waking up  . Gallstones    a. Seen on CT 01/2014.  Marland Kitchen GERD (gastroesophageal reflux disease)   . Hard of hearing   . Hepatic steatosis   . History of frequent urinary tract infections   . Hyperlipidemia   . Hypertension   . Lateral epicondylitis of right elbow   . Mental disorder   . Meralgia paresthetica of right side 10/02/2012   slight at 05/2014  . Migraine headache   . Obesity   . OSA (obstructive sleep apnea)    severe with AHI 37/hr now on CPAP at 18cm H2O  . Osteoarthritis   . Pancreatic cancer metastasized to liver ([place])   . Pneumonia    Feb 2018  . Raynaud disease    in feet per patient   . Sepsis ([place]) 02/23/2017  . Sleep apnea    wears c-pap  . Urinary tract infection    Past Surgical History:  Procedure Laterality Date  . ABDOMINAL HYSTERECTOMY  1/04   partial  . BLADDER SUSPENSION  6/10  . CARDIAC CATHETERIZATION    . carpel tunnel left/right  7/08, 8/08 Bilateral 8/08 and 7/08  . carpel tunnel rel Right 4/12  . CHOLECYSTECTOMY N/A 06/11/2014   Procedure: LAPAROSCOPIC CHOLECYSTECTOMY WITH ATTEMPTED INTRAOPERATIVE CHOLANGIOGRAM;  Surgeon: Jackolyn Confer, MD;  Location: [facility];  Service: General;  Laterality: N/A;  . COLONOSCOPY    . ERCP N/A 10/19/2014   Procedure: ENDOSCOPIC RETROGRADE CHOLANGIOPANCREATOGRAPHY (ERCP);  Surgeon: Ladene Artist, MD;  Location: [facility];  Service: Endoscopy;  Laterality: N/A;  . INCISIONAL HERNIA REPAIR N/A 06/30/2016   Procedure: LAPAROSCOPIC REPAIR OF INCISIONAL HERNIA WITH MESH;  Surgeon: Jackolyn Confer, MD;  Location: [facility];  Service: General;  Laterality: N/A;  . INSERTION OF MESH N/A 06/30/2016   Procedure: INSERTION OF MESH;  Surgeon: Jackolyn Confer, MD;  Location: [facility];  Service: General;  Laterality: N/A;  . IR CV LINE INJECTION  11/24/2016  . IR FLUORO GUIDE PORT INSERTION RIGHT  08/28/2016  . IR US GUIDE VASC ACCESS RIGHT  08/28/2016  . JOINT REPLACEMENT    . KNEE  ARTHROSCOPY Left 12/12  . KNEE ARTHROSCOPY Right 12/06  . LEFT HEART CATHETERIZATION WITH CORONARY ANGIOGRAM N/A 03/10/2014   Procedure: LEFT HEART CATHETERIZATION WITH CORONARY ANGIOGRAM;  Surgeon: [place] Grooms, MD;  Location: [facility] C[facility] LAB;  Service: Cardiovascular;  Laterality: N/A;  . PLANTAR FASCIA RELEASE Right 12/10  . radial tunnel release     right arm   . ROTATOR CUFF REPAIR Left 6/11  . tennis elbow release Right 7/04  . TOTAL KNEE ARTHROPLASTY Left 09/10/2012   Procedure: TOTAL KNEE ARTHROPLASTY- left;  Surgeon: Garald Balding, MD;  Location: [facility];  Service: Orthopedics;  Laterality: Left;  Left total knee arthroplasty    Medications: Prior to Admission medications   Medication Sig Start Date End Date Taking? Authorizing Provider  aspirin EC 81 MG tablet Take 81 mg by mouth daily.   Yes [provider]  citalopram (CELEXA) 20 MG tablet TAKE ONE (1) TABLET BY MOUTH EVERY DAY Patient taking differently: Take 20mg  by mouth daily 01/22/17  Yes Hoyt Koch, MD  Cranberry-Vitamin C-Vitamin E (CRANBERRY PLUS VITAMIN C PO) Take 1 tablet by mouth daily. 4200 mg   Yes [provider]  cyclobenzaprine (FLEXERIL) 5 MG tablet TAKE ONE (1) TABLET BY MOUTH 3 TIMES DAILY AS NEEDED FOR MUSCLE SPASMS Patient taking differently: Take 5mg  by mouth as needed for muscle spams 04/19/17  Yes Ladell Pier, MD  eletriptan (RELPAX) 40 MG tablet Take 1 tablet (40 mg total) by mouth every 2 (two) hours as needed for migraine. 06/26/16  Yes Dennie Bible, NP  HYDROcodone-acetaminophen (NORCO/VICODIN) 5-325 MG tablet Take 1 tablet by mouth every 6 (six) hours as needed for moderate pain.   Yes [provider]  HYDROmorphone (DILAUDID) 4 MG tablet Take 0.5 tablets (2 mg total) by mouth every 4 (four) hours as needed for severe pain. 04/27/17  Yes Ladell Pier, MD  insulin detemir (LEVEMIR) 100 unit/ml SOLN Inject 10 Units into the skin at bedtime.   Yes  [provider]  insulin lispro (HUMALOG) 100 UNIT/ML injection Inject 2-10 Units into the skin. Sliding Scale   Yes [provider]  lidocaine-prilocaine (EMLA) cream APPLY 1 APPLICATION TOPICALLY AS NEEDED.APPLY TO PORT A CATH SITE 1 HOUR PRIOR TO NEEDLE STICK. 03/26/17  Yes Ladell Pier, MD  LORazepam (ATIVAN) 1 MG tablet Take 1 tablet (1 mg total) by mouth every 8 (eight) hours as needed for anxiety or sedation (or nausea). 04/27/17  Yes Ladell Pier, MD  LYRICA 200 MG capsule Take 200 mg by mouth daily. 04/03/17  Yes [provider]  midodrine (PROAMATINE) 5 MG tablet Take 1 tablet (5 mg total) by mouth 3 (three) times daily with meals. 04/27/17  Yes Ladell Pier, MD  morphine (MS CONTIN) 30 MG 12 hr tablet Take 1 tablet (  30 mg total) by mouth every 12 (twelve) hours. 04/27/17  Yes Ladell Pier, MD  multivitamin-lutein [facility]) CAPS capsule Take 1 capsule by mouth daily.   Yes [provider]  oxybutynin (DITROPAN-XL) 10 MG 24 hr tablet Take 1 tablet (10 mg total) by mouth every morning. 03/19/17  Yes Ladell Pier, MD  pantoprazole (PROTONIX) 40 MG tablet TAKE ONE (1) TABLET BY MOUTH TWO (2) TIMES DAILY Patient taking differently: Take 40mg  by mouth twice daily 09/25/16  Yes Ladene Artist, MD  prochlorperazine (COMPAZINE) 10 MG tablet Take 10 mg by mouth every 6 (six) hours as needed for nausea or vomiting.   Yes [provider]  simethicone (MYLICON) 976 MG chewable tablet Chew 125-250 mg by mouth 2 (two) times daily as needed for flatulence.   Yes [provider]  vitamin B-12 (CYANOCOBALAMIN) 1000 MCG tablet Take 2,500 mcg by mouth every morning.   Yes [provider]  XARELTO 20 MG TABS tablet Take 20 mg daily by mouth. 01/31/17  Yes [provider]  nitroGLYCERIN (NITROSTAT) 0.4 MG SL tablet Place 1 tablet (0.4 mg total) under the tongue every 5 (five) minutes as needed for chest pain. 06/28/15   Belva Crome, MD  ondansetron (ZOFRAN-ODT) 8 MG disintegrating tablet Take 1 tablet (8 mg total) by mouth every 8 (eight) hours as needed for nausea or vomiting. Patient not taking: Reported on 04/28/2017 09/18/16   Maryanna Shape, NP  oxyCODONE (OXY IR/ROXICODONE) 5 MG immediate release tablet TAKE 1 TABLET BY MOUTH EVERY 4 HOURS AS NEEDED FOR SEVERE PAIN Patient not taking: Reported on 04/28/2017 04/27/17   Ladell Pier, MD  QC MAGNESIUM CITRATE 1.745 GM/30ML SOLN Take 296 mLs by mouth once. 04/27/17   [provider]  sorbitol 70 % solution Take 30 mLs by mouth daily as needed (for constipation). 04/26/17   Donne Hazel, MD    Allergies:   Allergies  Allergen Reactions  . Topamax [Topiramate] Other (See Comments)    Stroke like symptoms  . Aleve [Naproxen Sodium] Hives    Has tolerated Voltaren topical as well as aspirin.  . Bee Venom Swelling  . Echinacea Hives  . Other Other (See Comments)    Feathers cause sinus congestion  . Sulfa Antibiotics Hives  . Advil [Ibuprofen] Hives    Has tolerated Voltaren topical as well as aspirin.    Social History:  reports that  has never smoked. she has never used smokeless tobacco. She reports that she drinks alcohol. She reports that she does not use drugs.  Family History: Family History  Problem Relation Age of Onset  . Hypertension Mother   . Stroke Mother   . Liver disease Mother        Abcess  . Hypertension Father   . Coronary artery disease Father   . Migraines Father   . Clotting disorder Father   . Kidney failure Brother   . Hypertension Brother   . Migraines Brother   . Migraines Daughter   . Breast cancer Other        Niece with breast cancer  . Colon cancer Paternal Grandmother   . Pancreatic cancer Paternal Grandmother   . Stomach cancer Paternal Grandmother   . Breast cancer Cousin   . Esophageal cancer Neg Hx   . Rectal cancer Neg Hx     Physical Exam: Vitals:   04/28/17 1715 04/28/17 1745 04/28/17  1815 04/28/17 1830  BP: (!) 104/52 Marland Kitchen)  108/53 (!) 114/45 (!) 118/54  Pulse: 98 (!) 108 (!) 109 (!) 103  Resp: 19 20 18 20   Temp:      TempSrc:      SpO2:  92% 98% 95%    General:  Alert and oriented times three, well developed and nourished, no acute distress, weak Eyes: PERRLA, pink conjunctiva, no scleral icterus ENT: dry oral mucosa, neck supple, no thyromegaly Lungs: clear to ascultation, no wheeze, no crackles, no use of accessory muscles Cardiovascular: regular rate and rhythm, no regurgitation, no gallops, no murmurs. No carotid bruits, no JVD Abdomen: soft, positive BS, nonspecific generalized tenderness to palpation, non-distended, no organomegaly, not an acute abdomen GU: not examined Neuro: CN II - XII grossly intact, sensation intact Musculoskeletal: strength 5/5 all extremities, no clubbing, cyanosis or edema Skin: no rash, no subcutaneous crepitation, no decubitus Psych: appropriate patient   Labs on Admission:  Recent Labs    04/26/17 0446 04/28/17 1327  NA 136 132*  K 3.8 3.6  CL 102 100*  CO2 26 23  GLUCOSE 120* 100*  BUN 5* 6  CREATININE 0.66 0.53  CALCIUM 9.1 8.8*   No results for input(s): AST, ALT, ALKPHOS, BILITOT, PROT, ALBUMIN in the last 72 hours. No results for input(s): LIPASE, AMYLASE in the last 72 hours. Recent Labs    04/26/17 0446 04/28/17 1327  WBC 13.3* 12.3*  HGB 9.7* 8.8*  HCT 31.4* 27.0*  MCV 86.7 84.6  PLT 187 175   No results for input(s): CKTOTAL, CKMB, CKMBINDEX, TROPONINI in the last 72 hours. Invalid input(s): POCBNP Recent Labs    04/28/17 1327  DDIMER 0.59*   No results for input(s): HGBA1C in the last 72 hours. No results for input(s): CHOL, HDL, LDLCALC, TRIG, CHOLHDL, LDLDIRECT in the last 72 hours. No results for input(s): TSH, T4TOTAL, T3FREE, THYROIDAB in the last 72 hours.  Invalid input(s): FREET3 No results for input(s): VITAMINB12, FOLATE, FERRITIN, TIBC, IRON, RETICCTPCT in the last 72 hours.  Micro  Results: Recent Results (from the past 240 hour(s))  Culture, blood (routine x 2)     Status: None   Collection Time: 04/19/17  6:58 PM  Result Value Ref Range Status   Specimen Description BLOOD RIGHT ANTECUBITAL  Final   Special Requests   Final    BOTTLES DRAWN AEROBIC ONLY Blood Culture adequate volume   Culture   Final    NO GROWTH 5 DAYS Performed at [facility], [address], [place], [place] [address]    Report Status 04/24/2017 FINAL  Final  Culture, blood (routine x 2)     Status: None   Collection Time: 04/19/17  7:04 PM  Result Value Ref Range Status   Specimen Description BLOOD RIGHT ANTECUBITAL  Final   Special Requests   Final    BOTTLES DRAWN AEROBIC AND ANAEROBIC Blood Culture adequate volume   Culture   Final    NO GROWTH 5 DAYS Performed at [facility], [place] [address] [place], [place] [address]    Report Status 04/24/2017 FINAL  Final  Culture, Urine     Status: None   Collection Time: 04/20/17  5:16 AM  Result Value Ref Range Status   Specimen Description URINE, RANDOM  Final   Special Requests Normal  Final   Culture   Final    NO GROWTH Performed at [facility], [place] [address], [place], [place] [address]    Report Status 04/21/2017 FINAL  Final  Culture, Blood     Status: None (Preliminary result)   Collection Time: 04/24/17  9:54 AM  Result Value Ref Range Status   Specimen Description   Final    BLOOD LEFT ARM Performed at [facility], [place] [address] [place], [place] [address]    Special Requests   Final    NONE Performed at [facility], [place] [address], [place], [place] [address]    Culture   Final    NO GROWTH 4 DAYS Performed at [facility], [place] [address], [place], [place] [address]    Report Status PENDING  Incomplete  Culture, Blood     Status: None (Preliminary result)   Collection Time: 04/24/17 12:30 PM  Result Value Ref Range Status   Specimen  Description   Final    PORTA CATH Performed at [facility], [place] [address] [place], [place] [address]    Special Requests   Final    BOTTLES DRAWN AEROBIC AND ANAEROBIC Blood Culture adequate volume   Culture   Final    NO GROWTH 4 DAYS Performed at [facility], [place] [address], [place], [place] [address]    Report Status PENDING  Incomplete     Radiological Exams on Admission: Ct Angio Chest Pe W And/or Wo Contrast  Result Date: 04/28/2017 CLINICAL DATA:  Weakness and shortness of breath. History of pancreatic cancer with metastatic disease. EXAM: CT ANGIOGRAPHY CHEST WITH CONTRAST TECHNIQUE: Multidetector CT imaging of the chest was performed using the standard protocol during bolus administration of intravenous contrast. Multiplanar CT image reconstructions and MIPs were obtained to evaluate the vascular anatomy. CONTRAST:  100 mL ISOVUE-370 IOPAMIDOL (ISOVUE-370) INJECTION 76% COMPARISON:  04/13/2017 CT chest. FINDINGS: Cardiovascular: Satisfactory opacification of the pulmonary arteries to the segmental level. No evidence of pulmonary embolism. Normal heart size. No pericardial effusion. Mediastinum/Nodes: No enlarged mediastinal, hilar, or axillary lymph nodes. Thyroid gland, trachea, and esophagus demonstrate no significant findings. Port-A-Cath good position. Lungs/Pleura: Unchanged 6 mm RIGHT upper lobe nodule. Upper Abdomen: Hepatic metastatic disease appears similar to priors. Pancreatic head and body mass redemonstrated. Musculoskeletal: No chest wall abnormality. No acute or significant osseous findings. Review of the MIP images confirms the above findings. IMPRESSION: No evidence for pulmonary emboli. No acute cardiopulmonary findings. Hepatic metastatic disease from pancreatic cancer. Study is insufficient characterize for improvement or progression. Electronically Signed   By: Staci Righter M.D.   On: 04/28/2017 15:51   Ct Abdomen Pelvis W  Contrast  Result Date: 04/28/2017 CLINICAL DATA:  Progressive abdominal pain with history of pancreatic cancer. EXAM: CT ABDOMEN AND PELVIS WITH CONTRAST TECHNIQUE: Multidetector CT imaging of the abdomen and pelvis was performed using the standard protocol following bolus administration of intravenous contrast. CONTRAST:  80 cc Isovue 300 intravenously. COMPARISON:  Abdominal CT 04/13/2017 FINDINGS: Lower chest: Atelectatic changes in the right lung base. Partially visualized 6 mm right upper lobe pulmonary nodule. Hepatobiliary: No significant change in numerous bilateral hepatic metastatic lesions. Pancreas: Decreased in size hypoattenuated body of the pancreas malignancy, now measuring approximately 4.0 by 2.5 cm. Persistent dilation of the downstream main pancreatic duct and atrophy of the tail of the pancreas. Persistent peripancreatic fat stranding. Small peripancreatic lymph nodes appear stable. Spleen: Normal in size without focal abnormality. Adrenals/Urinary Tract: Adrenal glands are unremarkable. Kidneys are normal, without renal calculi, focal lesion, or hydronephrosis. Bladder is unremarkable. Stomach/Bowel: Stomach is within normal limits. Appendix appears normal. No  evidence of bowel wall thickening, distention, or inflammatory changes. Vascular/Lymphatic: Mild upper abdominal retroperitoneal lymphadenopathy. No significant vascular findings. Reproductive: Status post hysterectomy. No adnexal masses. Other: No abdominal wall hernia or abnormality. No abdominopelvic ascites. Prior ventral hernia repair. Musculoskeletal: No acute or significant osseous findings. IMPRESSION: Suboptimal timing of contrast administration. Interval mild decrease in size of pancreatic body malignancy. Mild upper abdominal retroperitoneal lymphadenopathy. No significant change in widespread hepatic metastatic disease. Electronically Signed   By: Fidela Salisbury M.D.   On: 04/28/2017 17:39   Dg Abd Portable 2  Views  Result Date: 04/28/2017 CLINICAL DATA:  Vomiting EXAM: PORTABLE ABDOMEN - 2 VIEW COMPARISON:  04/25/2017 FINDINGS: The bowel gas pattern is normal. There is no evidence of free air. No radio-opaque calculi or other significant radiographic abnormality is seen. IMPRESSION: Negative. Electronically Signed   By: Kathreen Devoid   On: 04/28/2017 14:17    Assessment/Plan Present on Admission: Generalized weakness . Physical deconditioning -Bring for observation on med telemetry -IV fluid hydration -Cultures ordered, no antibiotics started.  Monitor closely, currently no evidence of infection  . Dehydration -IV fluid hydration, BMP in a.m.  Orthostatic vitals in AM  . Benign essential HTN -Stable, resume home medications with hold parameters  Obstructive sleep apnea -CPAP machine ordered, okay for patient to use her own  Diabetes mellitus -Stable, resume home medications -Sliding scale insulin ordered  . Dyslipidemia -Stable, resume home medications  . Pancreatic carcinoma metastatic to liver [facility]) -Per oncologist, patient still with the [facility].  Her oncologist Dr. Malachy Mood  Chronic pain -Related to her pancreatic cancer.  Home meds resumed, Dilaudid IV as needed  Constipation -Bowel regimen ordered  Aanchal Cope 04/28/2017, 7:00 PM

## 2017-04-29 ENCOUNTER — Observation Stay (HOSPITAL_COMMUNITY): Payer: 59

## 2017-04-29 DIAGNOSIS — R0602 Shortness of breath: Secondary | ICD-10-CM | POA: Diagnosis not present

## 2017-04-29 DIAGNOSIS — E860 Dehydration: Secondary | ICD-10-CM | POA: Diagnosis not present

## 2017-04-29 DIAGNOSIS — F419 Anxiety disorder, unspecified: Secondary | ICD-10-CM | POA: Diagnosis not present

## 2017-04-29 DIAGNOSIS — F329 Major depressive disorder, single episode, unspecified: Secondary | ICD-10-CM | POA: Diagnosis not present

## 2017-04-29 LAB — GLUCOSE, CAPILLARY
GLUCOSE-CAPILLARY: 91 mg/dL (ref 65–99)
GLUCOSE-CAPILLARY: 143 mg/dL — AB (ref 65–99)
GLUCOSE-CAPILLARY: 108 mg/dL — AB (ref 65–99)
GLUCOSE-CAPILLARY: 136 mg/dL — AB (ref 65–99)

## 2017-04-29 LAB — CULTURE, BLOOD (SINGLE)
CULTURE: NO GROWTH
CULTURE: NO GROWTH
Special Requests: ADEQUATE

## 2017-04-29 LAB — BASIC METABOLIC PANEL
Anion gap: 7 (ref 5–15)
BUN: 5 mg/dL — AB (ref 6–20)
CALCIUM: 9.2 mg/dL (ref 8.9–10.3)
CO2: 27 mmol/L (ref 22–32)
CREATININE: 0.52 mg/dL (ref 0.44–1.00)
Chloride: 102 mmol/L (ref 101–111)
GFR calc Af Amer: >60 mL/min (ref >60)
GLUCOSE: 91 mg/dL (ref 65–99)
Potassium: 3.9 mmol/L (ref 3.5–5.1)
Sodium: 136 mmol/L (ref 135–145)

## 2017-04-29 LAB — CBC
HCT: 26.1 % — ABNORMAL LOW (ref 36.0–46.0)
HEMOGLOBIN: 8.4 g/dL — AB (ref 12.0–15.0)
MCH: 27.9 pg (ref 26.0–34.0)
MCHC: 32.2 g/dL (ref 30.0–36.0)
MCV: 86.7 fL (ref 78.0–100.0)
PLATELETS: 154 10*3/uL (ref 150–400)
RBC: 3.01 MIL/uL — ABNORMAL LOW (ref 3.87–5.11)
RDW: 19.8 % — AB (ref 11.5–15.5)
WBC: 10.7 10*3/uL — ABNORMAL HIGH (ref 4.0–10.5)

## 2017-04-29 MED ORDER — BISACODYL 5 MG PO TBEC
10.0000 mg | DELAYED_RELEASE_TABLET | Freq: Two times a day (BID) | ORAL | Status: DC
  Administered 2017-04-29 – 2017-05-01 (×4): 10 mg via ORAL
  Filled 2017-04-29 (×4): qty 2

## 2017-04-29 MED ORDER — ACETAMINOPHEN 325 MG PO TABS: 325.0000 mg | ORAL_TABLET | Freq: Four times a day (QID) | ORAL | Status: DC | PRN

## 2017-04-29 MED ORDER — LACTULOSE 10 GM/15ML PO SOLN
30.0000 g | Freq: Every day | ORAL | Status: AC
  Administered 2017-04-29: 30 g via ORAL
  Filled 2017-04-29: qty 45

## 2017-04-29 MED ORDER — LINACLOTIDE 145 MCG PO CAPS
145.0000 ug | ORAL_CAPSULE | Freq: Every day | ORAL | Status: DC
  Administered 2017-04-30: 145 ug via ORAL
  Filled 2017-04-29: qty 1

## 2017-04-29 MED ORDER — BISACODYL 10 MG RE SUPP
10.0000 mg | Freq: Once | RECTAL | Status: AC
  Administered 2017-04-29: 10 mg via RECTAL
  Filled 2017-04-29: qty 1

## 2017-04-29 NOTE — Progress Notes (Addendum)
PROGRESS NOTE    Heather Frederick  JKD:[id number] DOB: 05/29/1955 DOA: 04/28/2017 PCP: [name], MD  Brief Narrative: 62 year old female with a history of pancreatic cancer since April 2019.  She is on chemotherapy every 2 weeks.  She was started on a more aggressive course of chemotherapy approximately 2-3 months ago.  Her last chemotherapy treatment was ended on the 16th.  Since then she has had several intermittent episodes of severe weakness, tiredness, nausea and dizzyness.  This be her third admission for the same.  Last night she began feeling extremely tired and weak.  She also felt nauseous but did not vomit.  At approximately 2 AM she woke up she was diaphoretic.  When get up to go to restroom she was very weak and very lightheaded.  This continued to worsen the course of the day.  She had low-grade fevers Tmax 99.8.  They finally called her oncologist who directed them to come to the ER.  In the ER she is weak and is unable to hold down much p.o. intake.  Her lips are dry she is occasionally lightheaded.  The hospitalist have been asked to admit.  History provided mainly by her husband who is present at bedside.  The patient is alert and oriented Admission 1/18-1/23with SIRS,diaphoresis and tachycardia-resolved, 1 blood culture positive for a coagulase-negative Staphylococcus  Admission 1/24-1/26with recurrent hypotension, thought orthostatic. Negative workup. Seen by cardiology for tachycardia and hypotension and impression was chemotherapy-related side effects. Seen by pulmonology for hypotension, diaphoresis, SOB. Recommended cortisol check and consider low-dose midodrine. 04/29/2017 patient is husband in the room.  Patient had multiple admissions in the last 6 months total of 7 admissions in the last 6 months.  Patient was discharged from the hospital 131 after admission for the similar symptoms with nausea or diaphoresis and vomiting.  This morning she reports she does not have any  feelings of nausea or has not had any overnight.  However she has not moved her bowels.  She has a history of chronic constipation and on top of that she is on chronic narcotics.  KUB that was done today shows moderate colonic stool burden.  Patient has been treated with IV fluids overnight.   Assessment & Plan:   Active Problems:   Dyslipidemia   Benign essential HTN   Nausea   Pancreatic carcinoma metastatic to liver [facility])   Chronic anticoagulation   Generalized weakness   Physical deconditioning   Dehydration   Anxiety and depression   1] with generalized weakness/physical deconditioning/nausea vomiting and diaphoresis-patient has had multiple admissions for the same.  Her cortisol level is normal 3 days ago.  He has a history of pancreatic carcinoma with metastases of the liver currently on chemotherapy which has been rescheduled multiple times due to recurrent hospitalization for the same symptoms.  Patient has constipation and moderate colonic stool burden.  I will give her lactulose and rectal suppository or fleets enema to evaluate constipation.  She needs to be on chronic stool softeners plus or minus a laxative on a daily basis may be higher doses needed.  I will try this patient on Linzess daily.  2] metastatic pancreatic cancer with metastases to the liver followed by Dr. Malachy Mood with ongoing chemotherapy.  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  Continue home medications.  4] type 2 diabetes continue home meds.  5]hyhhpotension chronic.  Continue midodrine 5 mg 3 times a day.  6] dyslipidemia continue home medications  DVT  prophylaxis: xarelto Code Status full Family Communication: dw husband Disposition Plan:tbd  Consultants:  onc Procedures: none Antimicrobials:none  Subjective:feels better than yest   Objective: Vitals:   04/28/17 1830 04/28/17 2017 04/28/17 2146 04/29/17 0457  BP: (!) 118/54 (!) 100/40 (!) 115/47   Pulse:  (!) 103 84 (!) 103 74  Resp: 20 (!) 24 (!) 22 16  Temp:  97.8 F (36.6 C)  (!) 97.3 F (36.3 C)  TempSrc:  Oral  Oral  SpO2: 95% 100%  98%  Weight:  111.5 kg (245 lb 13 oz)    Height:  5\' 3"  (1.6 m)      Intake/Output Summary (Last 24 hours) at 04/29/2017 1229 Last data filed at 04/29/2017 0900 Gross per 24 hour  Intake 2458.75 ml  Output 900 ml  Net 1558.75 ml   Filed Weights   04/28/17 2017  Weight: 111.5 kg (245 lb 13 oz)    Examination:  General exam: Appears calm and comfortable  Respiratory system: Clear to auscultation. Respiratory effort normal. Cardiovascular system: S1 & S2 heard, RRR. No JVD, murmurs, rubs, gallops or clicks. No pedal edema. Gastrointestinal system: Abdomen is nondistended, soft and nontender. No organomegaly or masses felt. Normal bowel sounds heard. Central nervous system: Alert and oriented. No focal neurological deficits. Extremities: Symmetric 5 x 5 power. Skin: No rashes, lesions or ulcers Psychiatry: Judgement and insight appear normal. Mood & affect appropriate.     Data Reviewed: I have personally reviewed following labs and imaging studies  CBC: Recent Labs  Lab 04/24/17 0750 04/26/17 0446 04/28/17 1327 04/29/17 0300  WBC 10.3 13.3* 12.3* 10.7*  NEUTROABS 7.4*  --   --   --   HGB  --  9.7* 8.8* 8.4*  HCT 34.1* 31.4* 27.0* 26.1*  MCV 86.1 86.7 84.6 86.7  PLT 184 187 175 672   Basic Metabolic Panel: Recent Labs  Lab 04/24/17 0750 04/26/17 0446 04/28/17 1327 04/29/17 0300  NA 135* 136 132* 136  K 4.2 3.8 3.6 3.9  CL 99 102 100* 102  CO2 23 26 23 27   GLUCOSE 139 120* 100* 91  BUN 6* 5* 6 5*  CREATININE  --  0.66 0.53 0.52  CALCIUM 10.0 9.1 8.8* 9.2   GFR: Estimated Creatinine Clearance: 90.8 mL/min (by C-G formula based on SCr of 0.52 mg/dL). Liver Function Tests: Recent Labs  Lab 04/24/17 0750  AST 24  ALT 20  ALKPHOS 218*  BILITOT 0.8  PROT 7.1  ALBUMIN 2.8*   No results for input(s): LIPASE, AMYLASE in  the last 168 hours. No results for input(s): AMMONIA in the last 168 hours. Coagulation Profile: No results for input(s): INR, PROTIME in the last 168 hours. Cardiac Enzymes: No results for input(s): CKTOTAL, CKMB, CKMBINDEX, TROPONINI in the last 168 hours. BNP (last 3 results) No results for input(s): PROBNP in the last 8760 hours. HbA1C: No results for input(s): HGBA1C in the last 72 hours. CBG: Recent Labs  Lab 04/28/17 1330 04/28/17 2150 04/29/17 0949 04/29/17 1127  GLUCAP 69 98 91 143*   Lipid Profile: No results for input(s): CHOL, HDL, LDLCALC, TRIG, CHOLHDL, LDLDIRECT in the last 72 hours. Thyroid Function Tests: No results for input(s): TSH, T4TOTAL, FREET4, T3FREE, THYROIDAB in the last 72 hours. Anemia Panel: No results for input(s): VITAMINB12, FOLATE, FERRITIN, TIBC, IRON, RETICCTPCT in the last 72 hours. Sepsis Labs: No results for input(s): PROCALCITON, LATICACIDVEN in the last 168 hours.  Recent Results (from the past 240 hour(s))  Culture, blood (  routine x 2)     Status: None   Collection Time: 04/19/17  6:58 PM  Result Value Ref Range Status   Specimen Description BLOOD RIGHT ANTECUBITAL  Final   Special Requests   Final    BOTTLES DRAWN AEROBIC ONLY Blood Culture adequate volume   Culture   Final    NO GROWTH 5 DAYS Performed at [facility], [address], [place], [place] [address]    Report Status 04/24/2017 FINAL  Final  Culture, blood (routine x 2)     Status: None   Collection Time: 04/19/17  7:04 PM  Result Value Ref Range Status   Specimen Description BLOOD RIGHT ANTECUBITAL  Final   Special Requests   Final    BOTTLES DRAWN AEROBIC AND ANAEROBIC Blood Culture adequate volume   Culture   Final    NO GROWTH 5 DAYS Performed at [facility], [place] [address] [place], [place] [address]    Report Status 04/24/2017 FINAL  Final  Culture, Urine     Status: None   Collection Time: 04/20/17  5:16 AM  Result Value Ref Range Status    Specimen Description URINE, RANDOM  Final   Special Requests Normal  Final   Culture   Final    NO GROWTH Performed at [facility], [place] [address] [place], [place] [address]    Report Status 04/21/2017 FINAL  Final  Culture, Blood     Status: None (Preliminary result)   Collection Time: 04/24/17  9:54 AM  Result Value Ref Range Status   Specimen Description   Final    BLOOD LEFT ARM Performed at [facility], [place] [address] [place], [place] [address]    Special Requests   Final    NONE Performed at [facility], [place] [address], [place], [place] [address]    Culture   Final    NO GROWTH 4 DAYS Performed at [facility], [place] [address], [place], [place] [address]    Report Status PENDING  Incomplete  Culture, Blood     Status: None (Preliminary result)   Collection Time: 04/24/17 12:30 PM  Result Value Ref Range Status   Specimen Description BLOOD PORTA CATH  Final   Special Requests   Final    BOTTLES DRAWN AEROBIC AND ANAEROBIC Blood Culture adequate volume   Culture   Final    NO GROWTH 4 DAYS Performed at [facility], [place] [address], [place], [place] [address]    Report Status PENDING  Incomplete         Radiology Studies: Dg Abd 1 View  Result Date: 04/29/2017 CLINICAL DATA:  Constipation for the past 2-3 days. History of pancreatic cancer. EXAM: ABDOMEN - 1 VIEW COMPARISON:  04/28/2017; 04/25/2017; CT abdomen and pelvis - 04/28/2017 FINDINGS: Moderate colonic stool burden without evidence of enteric obstruction. Nondiagnostic evaluation for pneumoperitoneum secondary to supine patient positioning and exclusion of the lower thorax. Surgical mesh overlies the right lower abdomen. Post cholecystectomy. No acute osseus abnormalities. Stigmata of DISH within the lower thoracic spine. IMPRESSION: Moderate colonic stool burden without evidence of enteric obstruction. Electronically Signed   By: Sandi Mariscal  M.D.   On: 04/29/2017 10:58   Ct Angio Chest Pe W And/or Wo Contrast  Result Date: 04/28/2017 CLINICAL DATA:  Weakness and shortness of breath. History of pancreatic cancer with metastatic disease. EXAM: CT ANGIOGRAPHY CHEST WITH CONTRAST TECHNIQUE: Multidetector CT  imaging of the chest was performed using the standard protocol during bolus administration of intravenous contrast. Multiplanar CT image reconstructions and MIPs were obtained to evaluate the vascular anatomy. CONTRAST:  100 mL ISOVUE-370 IOPAMIDOL (ISOVUE-370) INJECTION 76% COMPARISON:  04/13/2017 CT chest. FINDINGS: Cardiovascular: Satisfactory opacification of the pulmonary arteries to the segmental level. No evidence of pulmonary embolism. Normal heart size. No pericardial effusion. Mediastinum/Nodes: No enlarged mediastinal, hilar, or axillary lymph nodes. Thyroid gland, trachea, and esophagus demonstrate no significant findings. Port-A-Cath good position. Lungs/Pleura: Unchanged 6 mm RIGHT upper lobe nodule. Upper Abdomen: Hepatic metastatic disease appears similar to priors. Pancreatic head and body mass redemonstrated. Musculoskeletal: No chest wall abnormality. No acute or significant osseous findings. Review of the MIP images confirms the above findings. IMPRESSION: No evidence for pulmonary emboli. No acute cardiopulmonary findings. Hepatic metastatic disease from pancreatic cancer. Study is insufficient characterize for improvement or progression. Electronically Signed   By: Staci Righter M.D.   On: 04/28/2017 15:51   Ct Abdomen Pelvis W Contrast  Result Date: 04/28/2017 CLINICAL DATA:  Progressive abdominal pain with history of pancreatic cancer. EXAM: CT ABDOMEN AND PELVIS WITH CONTRAST TECHNIQUE: Multidetector CT imaging of the abdomen and pelvis was performed using the standard protocol following bolus administration of intravenous contrast. CONTRAST:  80 cc Isovue 300 intravenously. COMPARISON:  Abdominal CT 04/13/2017 FINDINGS:  Lower chest: Atelectatic changes in the right lung base. Partially visualized 6 mm right upper lobe pulmonary nodule. Hepatobiliary: No significant change in numerous bilateral hepatic metastatic lesions. Pancreas: Decreased in size hypoattenuated body of the pancreas malignancy, now measuring approximately 4.0 by 2.5 cm. Persistent dilation of the downstream main pancreatic duct and atrophy of the tail of the pancreas. Persistent peripancreatic fat stranding. Small peripancreatic lymph nodes appear stable. Spleen: Normal in size without focal abnormality. Adrenals/Urinary Tract: Adrenal glands are unremarkable. Kidneys are normal, without renal calculi, focal lesion, or hydronephrosis. Bladder is unremarkable. Stomach/Bowel: Stomach is within normal limits. Appendix appears normal. No evidence of bowel wall thickening, distention, or inflammatory changes. Vascular/Lymphatic: Mild upper abdominal retroperitoneal lymphadenopathy. No significant vascular findings. Reproductive: Status post hysterectomy. No adnexal masses. Other: No abdominal wall hernia or abnormality. No abdominopelvic ascites. Prior ventral hernia repair. Musculoskeletal: No acute or significant osseous findings. IMPRESSION: Suboptimal timing of contrast administration. Interval mild decrease in size of pancreatic body malignancy. Mild upper abdominal retroperitoneal lymphadenopathy. No significant change in widespread hepatic metastatic disease. Electronically Signed   By: Fidela Salisbury M.D.   On: 04/28/2017 17:39   Dg Abd Portable 2 Views  Result Date: 04/28/2017 CLINICAL DATA:  Vomiting EXAM: PORTABLE ABDOMEN - 2 VIEW COMPARISON:  04/25/2017 FINDINGS: The bowel gas pattern is normal. There is no evidence of free air. No radio-opaque calculi or other significant radiographic abnormality is seen. IMPRESSION: Negative. Electronically Signed   By: Kathreen Devoid   On: 04/28/2017 14:17        Scheduled Meds: . aspirin EC  81 mg Oral  Daily  . bisacodyl  10 mg Oral BID  . bisacodyl  10 mg Rectal Once  . citalopram  20 mg Oral Daily  . insulin aspart  0-15 Units Subcutaneous TID WC  . insulin aspart  0-5 Units Subcutaneous QHS  . insulin detemir  10 Units Subcutaneous QHS  . lactulose  30 g Oral Daily  . mouth rinse  15 mL Mouth Rinse BID  . midodrine  5 mg Oral TID WC  . morphine  30 mg Oral Q12H  . oxybutynin  10 mg Oral q morning - 10a  . pantoprazole  40 mg Oral Daily  . polyethylene glycol  17 g Oral BID  . pregabalin  200 mg Oral Daily  . rivaroxaban  20 mg Oral Daily  . simethicone  80 mg Oral QID   Continuous Infusions: . 0.9 % NaCl with KCl 20 mEq / L 75 mL/hr at 04/29/17 1026     LOS: 0 days    Georgette Shell, MD [facility]  If 7PM-7AM, please contact night-coverage [email or web address] Password [facility] 04/29/2017, 12:29 PM

## 2017-04-30 ENCOUNTER — Encounter: Payer: Self-pay | Admitting: Oncology

## 2017-04-30 ENCOUNTER — Observation Stay (HOSPITAL_COMMUNITY): Payer: 59

## 2017-04-30 DIAGNOSIS — K760 Fatty (change of) liver, not elsewhere classified: Secondary | ICD-10-CM | POA: Diagnosis present

## 2017-04-30 DIAGNOSIS — E860 Dehydration: Secondary | ICD-10-CM | POA: Diagnosis present

## 2017-04-30 DIAGNOSIS — Z8249 Family history of ischemic heart disease and other diseases of the circulatory system: Secondary | ICD-10-CM | POA: Diagnosis not present

## 2017-04-30 DIAGNOSIS — E119 Type 2 diabetes mellitus without complications: Secondary | ICD-10-CM | POA: Diagnosis not present

## 2017-04-30 DIAGNOSIS — G8929 Other chronic pain: Secondary | ICD-10-CM | POA: Diagnosis not present

## 2017-04-30 DIAGNOSIS — H9190 Unspecified hearing loss, unspecified ear: Secondary | ICD-10-CM | POA: Diagnosis present

## 2017-04-30 DIAGNOSIS — Z823 Family history of stroke: Secondary | ICD-10-CM | POA: Diagnosis not present

## 2017-04-30 DIAGNOSIS — Z832 Family history of diseases of the blood and blood-forming organs and certain disorders involving the immune mechanism: Secondary | ICD-10-CM | POA: Diagnosis not present

## 2017-04-30 DIAGNOSIS — Z9049 Acquired absence of other specified parts of digestive tract: Secondary | ICD-10-CM | POA: Diagnosis not present

## 2017-04-30 DIAGNOSIS — C251 Malignant neoplasm of body of pancreas: Secondary | ICD-10-CM | POA: Diagnosis present

## 2017-04-30 DIAGNOSIS — Z7982 Long term (current) use of aspirin: Secondary | ICD-10-CM | POA: Diagnosis not present

## 2017-04-30 DIAGNOSIS — I10 Essential (primary) hypertension: Secondary | ICD-10-CM | POA: Diagnosis present

## 2017-04-30 DIAGNOSIS — Z87440 Personal history of urinary (tract) infections: Secondary | ICD-10-CM | POA: Diagnosis not present

## 2017-04-30 DIAGNOSIS — Z7901 Long term (current) use of anticoagulants: Secondary | ICD-10-CM | POA: Diagnosis not present

## 2017-04-30 DIAGNOSIS — Z794 Long term (current) use of insulin: Secondary | ICD-10-CM | POA: Diagnosis not present

## 2017-04-30 DIAGNOSIS — F329 Major depressive disorder, single episode, unspecified: Secondary | ICD-10-CM | POA: Diagnosis not present

## 2017-04-30 DIAGNOSIS — F419 Anxiety disorder, unspecified: Secondary | ICD-10-CM | POA: Diagnosis not present

## 2017-04-30 DIAGNOSIS — R5081 Fever presenting with conditions classified elsewhere: Secondary | ICD-10-CM | POA: Diagnosis present

## 2017-04-30 DIAGNOSIS — C787 Secondary malignant neoplasm of liver and intrahepatic bile duct: Secondary | ICD-10-CM | POA: Diagnosis present

## 2017-04-30 DIAGNOSIS — Z96652 Presence of left artificial knee joint: Secondary | ICD-10-CM | POA: Diagnosis present

## 2017-04-30 DIAGNOSIS — Z86718 Personal history of other venous thrombosis and embolism: Secondary | ICD-10-CM | POA: Diagnosis not present

## 2017-04-30 DIAGNOSIS — R0602 Shortness of breath: Secondary | ICD-10-CM | POA: Diagnosis present

## 2017-04-30 DIAGNOSIS — G4733 Obstructive sleep apnea (adult) (pediatric): Secondary | ICD-10-CM | POA: Diagnosis present

## 2017-04-30 DIAGNOSIS — E785 Hyperlipidemia, unspecified: Secondary | ICD-10-CM | POA: Diagnosis present

## 2017-04-30 DIAGNOSIS — M1990 Unspecified osteoarthritis, unspecified site: Secondary | ICD-10-CM | POA: Diagnosis present

## 2017-04-30 DIAGNOSIS — I7300 Raynaud's syndrome without gangrene: Secondary | ICD-10-CM | POA: Diagnosis present

## 2017-04-30 DIAGNOSIS — K219 Gastro-esophageal reflux disease without esophagitis: Secondary | ICD-10-CM | POA: Diagnosis present

## 2017-04-30 LAB — GLUCOSE, CAPILLARY
GLUCOSE-CAPILLARY: 127 mg/dL — AB (ref 65–99)
GLUCOSE-CAPILLARY: 83 mg/dL (ref 65–99)
GLUCOSE-CAPILLARY: 189 mg/dL — AB (ref 65–99)
Glucose-Capillary: 201 mg/dL — ABNORMAL HIGH (ref 65–99)
Glucose-Capillary: 155 mg/dL — ABNORMAL HIGH (ref 65–99)

## 2017-04-30 MED ORDER — LORAZEPAM BOLUS VIA INFUSION: 1.0000 mg | Freq: Once | INTRAVENOUS | Status: DC

## 2017-04-30 MED ORDER — LACTULOSE 10 GM/15ML PO SOLN
30.0000 g | Freq: Once | ORAL | Status: AC
  Administered 2017-04-30: 30 g via ORAL
  Filled 2017-04-30: qty 45

## 2017-04-30 MED ORDER — FLEET ENEMA 7-19 GM/118ML RE ENEM
1.0000 | ENEMA | Freq: Once | RECTAL | Status: AC
  Administered 2017-04-30: 1 via RECTAL
  Filled 2017-04-30: qty 1

## 2017-04-30 MED ORDER — GADOBENATE DIMEGLUMINE 529 MG/ML IV SOLN
20.0000 mL | Freq: Once | INTRAVENOUS | Status: AC | PRN
  Administered 2017-04-30: 20 mL via INTRAVENOUS

## 2017-04-30 MED ORDER — LORAZEPAM 2 MG/ML IJ SOLN
1.0000 mg | Freq: Once | INTRAMUSCULAR | Status: AC
  Administered 2017-04-30: 1 mg via INTRAVENOUS
  Filled 2017-04-30: qty 1

## 2017-04-30 NOTE — Care Management Note (Signed)
Case Management Note  Patient Details  Name: Heather Frederick MRN: [id number] Date of Birth: [birth year]  Subjective/Objective: 62 y/o f admitted w/nausea. Hx: Pancreatic Ca.From home. PT cons-await recc.                   Action/Plan:d/c plan home.   Expected Discharge Date:                  Expected Discharge Plan:  Home/Self Care  In-House Referral:     Discharge planning Services  CM Consult  Post Acute Care Choice:    Choice offered to:     DME Arranged:    DME Agency:     HH Arranged:    HH Agency:     Status of Service:  In process, will continue to follow  If discussed at Long Length of Stay Meetings, dates discussed:    Additional Comments:  Dessa Phi, RN 04/30/2017, 10:12 AM

## 2017-04-30 NOTE — Progress Notes (Signed)
Submitted auth request for Morphine Sulfate today.  Status is pending.  °

## 2017-04-30 NOTE — Progress Notes (Signed)
PT Cancellation Note  Patient Details Name: Heather Frederick MRN: [id number] DOB: 1955-06-26   Cancelled Treatment:    Reason Eval/Treat Not Completed: Medical issues which prohibited therapy; patient reports getting herself to [facility] okay, but frequent stools due to medication to relieve constipation.  Preferred to wait until bowels less active to ambulate.  Will check back another day.   Reginia Naas 04/30/2017, 2:46 PM  Magda Kiel, [place] 04/30/2017

## 2017-04-30 NOTE — Progress Notes (Signed)
PROGRESS NOTE    Heather Frederick  OVF:[id number] DOB: [birth year] DOA: 04/28/2017 PCP: [name], MD  Brief Narrative:62 year old female with a history of pancreatic cancer since April 2019. She is on chemotherapy every 2 weeks. She was started on amore aggressive course of chemotherapy approximately 2-3 months ago. Her last chemotherapy treatment was ended on the 16th.Since then she has had severalintermittent episodes ofsevereweakness, tiredness, nauseaand dizzyness.This be her third admission for the same. Last night she began feeling extremely tired and weak. She also felt nauseous but did not vomit. At approximately 2 AM she woke up she was diaphoretic. When get up to go to restroom she was very weak and very lightheaded. This continued to worsen the course of the day. She had low-grade fevers Tmax99.8. They finally called her oncologist who directed them to come to the ER. In the ER she is weak and is unable to hold down much p.o. intake. Her lips are dry she is occasionally lightheaded. The hospitalist havebeen asked to admit.  History provided mainly by her husband who is present at bedside. The patient is alert and oriented Admission 1/18-1/23with SIRS,diaphoresis and tachycardia-resolved, 1 blood culture positive for a coagulase-negative Staphylococcus  Admission 1/24-1/26with recurrent hypotension, thought orthostatic. Negative workup. Seen by cardiology for tachycardia and hypotension and impression was chemotherapy-related side effects. Seen by pulmonology for hypotension, diaphoresis, SOB. Recommended cortisol check and consider low-dose midodrine. 04/29/2017 patient is husband in the room.  Patient had multiple admissions in the last 6 months total of 7 admissions in the last 6 months.  Patient was discharged from the hospital 131 after admission for the similar symptoms with nausea or diaphoresis and vomiting.  This morning she reports she does not have any  feelings of nausea or has not had any overnight.  However she has not moved her bowels.  She has a history of chronic constipation and on top of that she is on chronic narcotics.  KUB that was done today shows moderate colonic stool burden.  Patient has been treated with IV fluids overnight.  04/30/2017 patient has not had a BM even after receiving lactulose as well as Dulcolax suppository.  Continues to be nauseous.  Not able to tolerate p.o.     Assessment & Plan:   Active Problems:   Dyslipidemia   Benign essential HTN   Nausea   Pancreatic carcinoma metastatic to liver [facility])   Chronic anticoagulation   Generalized weakness   Physical deconditioning   Dehydration   Anxiety and depression   1] with generalized weakness/physical deconditioning/nausea vomiting and diaphoresis-patient has had multiple admissions for the same.  Her cortisol level is normal 3 days ago.  He has a history of pancreatic carcinoma with metastases of the liver currently on chemotherapy which has been rescheduled multiple times due to recurrent hospitalization for the same symptoms.  Patient has constipation and moderate colonic stool burden.  I will give her lactulose and rectal suppository or fleets enema to evaluate constipation.  She needs to be on chronic stool softeners plus or minus a laxative on a daily basis may be higher doses needed.  We will give her an enema today along with Dulcolax and MiraLAX.  2] metastatic pancreatic cancer with metastases to the liver followed by Dr. Malachy Mood with ongoing chemotherapy.  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  Continue home medications.  4] type 2 diabetes continue home meds.  5]hyhhpotension chronic.  Continue midodrine 5 mg 3 times a day.  6] dyslipidemia continue home medications     DVT prophylaxis: Xarelto Code Status: Full code  family Communication: Discussed with husband Disposition Plan: TBD  Consultants:  None  Procedures: None Antimicrobials: None  Subjective: Still complains of constipation.   Objective: Vitals:   04/29/17 2053 04/29/17 2208 04/29/17 2329 04/30/17 0604  BP: (!) 126/49     Pulse: (!) 109     Resp: 16   16  Temp:  (!) 101.8 F (38.8 C) 99.4 F (37.4 C) 98 F (36.7 C)  TempSrc:  Oral Oral Oral  SpO2: 99%   98%  Weight:      Height:        Intake/Output Summary (Last 24 hours) at 04/30/2017 1018 Last data filed at 04/30/2017 0921 Gross per 24 hour  Intake 1991.25 ml  Output -  Net 1991.25 ml   Filed Weights   04/28/17 2017  Weight: 111.5 kg (245 lb 13 oz)    Examination:  General exam: Appears calm and comfortable  Respiratory system: Clear to auscultation. Respiratory effort normal. Cardiovascular system: S1 & S2 heard, RRR. No JVD, murmurs, rubs, gallops or clicks. No pedal edema. Gastrointestinal system: Abdomen is distended, soft and nontender. No organomegaly or masses felt. Normal bowel sounds heard. Central nervous system: Alert and oriented. No focal neurological deficits. Extremities: Symmetric 5 x 5 power. Skin: No rashes, lesions or ulcers Psychiatry: Judgement and insight appear normal. Mood & affect appropriate.     Data Reviewed: I have personally reviewed following labs and imaging studies  CBC: Recent Labs  Lab 04/24/17 0750 04/26/17 0446 04/28/17 1327 04/29/17 0300  WBC 10.3 13.3* 12.3* 10.7*  NEUTROABS 7.4*  --   --   --   HGB  --  9.7* 8.8* 8.4*  HCT 34.1* 31.4* 27.0* 26.1*  MCV 86.1 86.7 84.6 86.7  PLT 184 187 175 332   Basic Metabolic Panel: Recent Labs  Lab 04/24/17 0750 04/26/17 0446 04/28/17 1327 04/29/17 0300  NA 135* 136 132* 136  K 4.2 3.8 3.6 3.9  CL 99 102 100* 102  CO2 23 26 23 27   GLUCOSE 139 120* 100* 91  BUN 6* 5* 6 5*  CREATININE  --  0.66 0.53 0.52  CALCIUM 10.0 9.1 8.8* 9.2   GFR: Estimated Creatinine Clearance: 90.8 mL/min (by C-G formula based on SCr of 0.52 mg/dL). Liver Function  Tests: Recent Labs  Lab 04/24/17 0750  AST 24  ALT 20  ALKPHOS 218*  BILITOT 0.8  PROT 7.1  ALBUMIN 2.8*   No results for input(s): LIPASE, AMYLASE in the last 168 hours. No results for input(s): AMMONIA in the last 168 hours. Coagulation Profile: No results for input(s): INR, PROTIME in the last 168 hours. Cardiac Enzymes: No results for input(s): CKTOTAL, CKMB, CKMBINDEX, TROPONINI in the last 168 hours. BNP (last 3 results) No results for input(s): PROBNP in the last 8760 hours. HbA1C: No results for input(s): HGBA1C in the last 72 hours. CBG: Recent Labs  Lab 04/29/17 0949 04/29/17 1127 04/29/17 1621 04/29/17 2306 04/30/17 0748  GLUCAP 91 143* 108* 136* 201*   Lipid Profile: No results for input(s): CHOL, HDL, LDLCALC, TRIG, CHOLHDL, LDLDIRECT in the last 72 hours. Thyroid Function Tests: No results for input(s): TSH, T4TOTAL, FREET4, T3FREE, THYROIDAB in the last 72 hours. Anemia Panel: No results for input(s): VITAMINB12, FOLATE, FERRITIN, TIBC, IRON, RETICCTPCT in the last 72 hours. Sepsis Labs: No results for input(s): PROCALCITON, LATICACIDVEN in the last 168 hours.  Recent Results (  from the past 240 hour(s))  Culture, Blood     Status: None   Collection Time: 04/24/17  9:54 AM  Result Value Ref Range Status   Specimen Description   Final    BLOOD LEFT ARM Performed at [facility], [address], [place], [place] [address]    Special Requests   Final    NONE Performed at [facility], [place] [address] [place], [place] [address]    Culture   Final    NO GROWTH 5 DAYS Performed at [facility], [place] [address] [place], [place] [address]    Report Status 04/29/2017 FINAL  Final  Culture, Blood     Status: None   Collection Time: 04/24/17 12:30 PM  Result Value Ref Range Status   Specimen Description BLOOD PORTA CATH  Final   Special Requests   Final    BOTTLES DRAWN AEROBIC AND ANAEROBIC Blood  Culture adequate volume   Culture   Final    NO GROWTH 5 DAYS Performed at [facility], [place] [address] [place], [place] [address]    Report Status 04/29/2017 FINAL  Final         Radiology Studies: Dg Abd 1 View  Result Date: 04/29/2017 CLINICAL DATA:  Constipation for the past 2-3 days. History of pancreatic cancer. EXAM: ABDOMEN - 1 VIEW COMPARISON:  04/28/2017; 04/25/2017; CT abdomen and pelvis - 04/28/2017 FINDINGS: Moderate colonic stool burden without evidence of enteric obstruction. Nondiagnostic evaluation for pneumoperitoneum secondary to supine patient positioning and exclusion of the lower thorax. Surgical mesh overlies the right lower abdomen. Post cholecystectomy. No acute osseus abnormalities. Stigmata of DISH within the lower thoracic spine. IMPRESSION: Moderate colonic stool burden without evidence of enteric obstruction. Electronically Signed   By: Sandi Mariscal M.D.   On: 04/29/2017 10:58   Ct Angio Chest Pe W And/or Wo Contrast  Result Date: 04/28/2017 CLINICAL DATA:  Weakness and shortness of breath. History of pancreatic cancer with metastatic disease. EXAM: CT ANGIOGRAPHY CHEST WITH CONTRAST TECHNIQUE: Multidetector CT imaging of the chest was performed using the standard protocol during bolus administration of intravenous contrast. Multiplanar CT image reconstructions and MIPs were obtained to evaluate the vascular anatomy. CONTRAST:  100 mL ISOVUE-370 IOPAMIDOL (ISOVUE-370) INJECTION 76% COMPARISON:  04/13/2017 CT chest. FINDINGS: Cardiovascular: Satisfactory opacification of the pulmonary arteries to the segmental level. No evidence of pulmonary embolism. Normal heart size. No pericardial effusion. Mediastinum/Nodes: No enlarged mediastinal, hilar, or axillary lymph nodes. Thyroid gland, trachea, and esophagus demonstrate no significant findings. Port-A-Cath good position. Lungs/Pleura: Unchanged 6 mm RIGHT upper lobe nodule. Upper Abdomen: Hepatic metastatic disease  appears similar to priors. Pancreatic head and body mass redemonstrated. Musculoskeletal: No chest wall abnormality. No acute or significant osseous findings. Review of the MIP images confirms the above findings. IMPRESSION: No evidence for pulmonary emboli. No acute cardiopulmonary findings. Hepatic metastatic disease from pancreatic cancer. Study is insufficient characterize for improvement or progression. Electronically Signed   By: Staci Righter M.D.   On: 04/28/2017 15:51   Ct Abdomen Pelvis W Contrast  Result Date: 04/28/2017 CLINICAL DATA:  Progressive abdominal pain with history of pancreatic cancer. EXAM: CT ABDOMEN AND PELVIS WITH CONTRAST TECHNIQUE: Multidetector CT imaging of the abdomen and pelvis was performed using the standard protocol following bolus administration of intravenous contrast. CONTRAST:  80 cc Isovue 300 intravenously. COMPARISON:  Abdominal CT 04/13/2017 FINDINGS: Lower chest: Atelectatic changes in the right lung base. Partially visualized  6 mm right upper lobe pulmonary nodule. Hepatobiliary: No significant change in numerous bilateral hepatic metastatic lesions. Pancreas: Decreased in size hypoattenuated body of the pancreas malignancy, now measuring approximately 4.0 by 2.5 cm. Persistent dilation of the downstream main pancreatic duct and atrophy of the tail of the pancreas. Persistent peripancreatic fat stranding. Small peripancreatic lymph nodes appear stable. Spleen: Normal in size without focal abnormality. Adrenals/Urinary Tract: Adrenal glands are unremarkable. Kidneys are normal, without renal calculi, focal lesion, or hydronephrosis. Bladder is unremarkable. Stomach/Bowel: Stomach is within normal limits. Appendix appears normal. No evidence of bowel wall thickening, distention, or inflammatory changes. Vascular/Lymphatic: Mild upper abdominal retroperitoneal lymphadenopathy. No significant vascular findings. Reproductive: Status post hysterectomy. No adnexal masses.  Other: No abdominal wall hernia or abnormality. No abdominopelvic ascites. Prior ventral hernia repair. Musculoskeletal: No acute or significant osseous findings. IMPRESSION: Suboptimal timing of contrast administration. Interval mild decrease in size of pancreatic body malignancy. Mild upper abdominal retroperitoneal lymphadenopathy. No significant change in widespread hepatic metastatic disease. Electronically Signed   By: Fidela Salisbury M.D.   On: 04/28/2017 17:39   Dg Abd Portable 2 Views  Result Date: 04/28/2017 CLINICAL DATA:  Vomiting EXAM: PORTABLE ABDOMEN - 2 VIEW COMPARISON:  04/25/2017 FINDINGS: The bowel gas pattern is normal. There is no evidence of free air. No radio-opaque calculi or other significant radiographic abnormality is seen. IMPRESSION: Negative. Electronically Signed   By: Kathreen Devoid   On: 04/28/2017 14:17        Scheduled Meds: . aspirin EC  81 mg Oral Daily  . bisacodyl  10 mg Oral BID  . citalopram  20 mg Oral Daily  . insulin aspart  0-15 Units Subcutaneous TID WC  . insulin aspart  0-5 Units Subcutaneous QHS  . insulin detemir  10 Units Subcutaneous QHS  . mouth rinse  15 mL Mouth Rinse BID  . midodrine  5 mg Oral TID WC  . morphine  30 mg Oral Q12H  . oxybutynin  10 mg Oral q morning - 10a  . pantoprazole  40 mg Oral Daily  . polyethylene glycol  17 g Oral BID  . pregabalin  200 mg Oral Daily  . rivaroxaban  20 mg Oral Daily  . simethicone  80 mg Oral QID   Continuous Infusions: . 0.9 % NaCl with KCl 20 mEq / L 75 mL/hr at 04/29/17 2352     LOS: 0 days    Georgette Shell, MD [facility] If 7PM-7AM, please contact night-coverage [email or web address] Password [email or web address] 04/30/2017, 10:18 AM

## 2017-05-01 ENCOUNTER — Inpatient Hospital Stay (HOSPITAL_COMMUNITY): Payer: 59

## 2017-05-01 DIAGNOSIS — K5900 Constipation, unspecified: Secondary | ICD-10-CM

## 2017-05-01 DIAGNOSIS — Z79891 Long term (current) use of opiate analgesic: Secondary | ICD-10-CM

## 2017-05-01 DIAGNOSIS — G4733 Obstructive sleep apnea (adult) (pediatric): Secondary | ICD-10-CM

## 2017-05-01 DIAGNOSIS — C251 Malignant neoplasm of body of pancreas: Secondary | ICD-10-CM

## 2017-05-01 DIAGNOSIS — C787 Secondary malignant neoplasm of liver and intrahepatic bile duct: Secondary | ICD-10-CM

## 2017-05-01 DIAGNOSIS — E119 Type 2 diabetes mellitus without complications: Secondary | ICD-10-CM

## 2017-05-01 DIAGNOSIS — K219 Gastro-esophageal reflux disease without esophagitis: Secondary | ICD-10-CM

## 2017-05-01 DIAGNOSIS — R5081 Fever presenting with conditions classified elsewhere: Secondary | ICD-10-CM

## 2017-05-01 DIAGNOSIS — G8929 Other chronic pain: Secondary | ICD-10-CM

## 2017-05-01 LAB — GLUCOSE, CAPILLARY
GLUCOSE-CAPILLARY: 90 mg/dL (ref 65–99)
GLUCOSE-CAPILLARY: 113 mg/dL — AB (ref 65–99)

## 2017-05-01 MED ORDER — POLYETHYLENE GLYCOL 3350 17 G PO PACK: 17.0000 g | PACK | Freq: Two times a day (BID) | ORAL | 0 refills | Status: DC

## 2017-05-01 MED ORDER — LACTULOSE 10 GM/15ML PO SOLN: 20.0000 g | Freq: Every day | ORAL | 0 refills | Status: DC

## 2017-05-01 MED ORDER — ACETAMINOPHEN 325 MG PO TABS: 650.0000 mg | ORAL_TABLET | Freq: Two times a day (BID) | ORAL | Status: AC

## 2017-05-01 MED ORDER — ONDANSETRON HCL 4 MG PO TABS: 4.0000 mg | ORAL_TABLET | Freq: Four times a day (QID) | ORAL | 0 refills | Status: DC | PRN

## 2017-05-01 MED ORDER — ACETAMINOPHEN 325 MG PO TABS
650.0000 mg | ORAL_TABLET | Freq: Two times a day (BID) | ORAL | Status: DC
  Administered 2017-05-01: 650 mg via ORAL
  Filled 2017-05-01: qty 2

## 2017-05-01 MED ORDER — LACTULOSE 10 GM/15ML PO SOLN
20.0000 g | Freq: Every day | ORAL | Status: DC
  Filled 2017-05-01: qty 30

## 2017-05-01 MED ORDER — BISACODYL 5 MG PO TBEC: 10.0000 mg | DELAYED_RELEASE_TABLET | Freq: Two times a day (BID) | ORAL | 0 refills | Status: DC

## 2017-05-01 MED ORDER — HEPARIN SOD (PORK) LOCK FLUSH 100 UNIT/ML IV SOLN
500.0000 [IU] | INTRAVENOUS | Status: AC | PRN
  Administered 2017-05-01: 500 [IU]

## 2017-05-01 NOTE — Evaluation (Signed)
Physical Therapy Evaluation Patient Details Name: Heather Frederick MRN: [id number] DOB: 03-06-1956 Today's Date: 05/01/2017   History of Present Illness  62 y.o. female hx of pancreatic cancer diagnosed in May 2018 on chemotherapy, has previously had and currently admitted for several intermittent episodes of severe weakness, tiredness, nausea and dizziness.   Clinical Impression  Pt evaluated by Physical Therapy with no further acute PT needs identified. Pt was getting dressed on arrival. Pt reported being ready to discharge soon but willing to work with PT. Pt ambulated safely while pushing IV pole and reported no concerns or need for PT.  All education has been completed and the patient has no further questions. See below for any follow up Physical Therapy or equipment needs. PT is signing off. Thank you for this referral.     Follow Up Recommendations No PT follow up    Equipment Recommendations  None recommended by PT    Recommendations for Other Services       Precautions / Restrictions Precautions Precautions: None Restrictions Weight Bearing Restrictions: No      Mobility  Bed Mobility               General bed mobility comments: Pt sitting on EOB on arrival  Transfers Overall transfer level: Modified independent Equipment used: None Transfers: Sit to/from Stand Sit to Stand: Modified independent (Device/Increase time)         General transfer comment: increased time  Ambulation/Gait Ambulation/Gait assistance: Modified independent (Device/Increase time);Supervision Ambulation Distance (Feet): 120 Feet Assistive device: (pt pushed IV pole for extra balance/safety) Gait Pattern/deviations: Step-through pattern;Decreased stride length     General Gait Details: pt did not require any cues, slightly decreased but steady pace due to fatigue, rest break halfway through  [employer]            Wheelchair Mobility    Modified Rankin (Stroke Patients Only)        Balance Overall balance assessment: No apparent balance deficits (not formally assessed)                                           Pertinent Vitals/Pain Pain Assessment: No/denies pain    Home Living Family/patient expects to be discharged to:: Private residence Living Arrangements: Spouse/significant other Available Help at Discharge: Family Type of Home: House Home Access: Stairs to enter Entrance Stairs-Rails: Chemical engineer of Steps: 2 Home Layout: One level Home Equipment: Environmental consultant - 2 wheels;Cane - single point;Wheelchair - manual      Prior Function Level of Independence: Independent with assistive device(s)         Comments: used cane when out or alone, furniture walked at home     Hand Dominance        Extremity/Trunk Assessment   Upper Extremity Assessment Upper Extremity Assessment: Overall WFL for tasks assessed    Lower Extremity Assessment Lower Extremity Assessment: Overall WFL for tasks assessed       Communication   Communication: No difficulties  Cognition Arousal/Alertness: Awake/alert Behavior During Therapy: WFL for tasks assessed/performed Overall Cognitive Status: Within Functional Limits for tasks assessed                                        General Comments      Exercises  Assessment/Plan    PT Assessment Patent does not need any further PT services  PT Problem List         PT Treatment Interventions      PT Goals (Current goals can be found in the Care Plan section)  Acute Rehab PT Goals PT Goal Formulation: All assessment and education complete, DC therapy    Frequency     Barriers to discharge        Co-evaluation               AM-PAC PT "6 Clicks" Daily Activity  Outcome Measure Difficulty turning over in bed (including adjusting bedclothes, sheets and blankets)?: None Difficulty moving from lying on back to sitting on the side of the  bed? : None Difficulty sitting down on and standing up from a chair with arms (e.g., wheelchair, bedside commode, etc,.)?: None Help needed moving to and from a bed to chair (including a wheelchair)?: None Help needed walking in hospital room?: None Help needed climbing 3-5 steps with a railing? : A Little 6 Click Score: 23    End of Session   Activity Tolerance: Patient tolerated treatment well Patient left: with family/visitor present;with call bell/phone within reach;Other (comment)(sitting on EOB to finish dressing with help of husband) Nurse Communication: Mobility status PT Visit Diagnosis: Difficulty in walking, not elsewhere classified (R26.2);Muscle weakness (generalized) (M62.81)    Time: 1006-1020 PT Time Calculation (min) (ACUTE ONLY): 14 min   Charges:   PT Evaluation $PT Eval Low Complexity: 1 Low     PT G Codes:       [place] Iyanni Hepp, SPT  [place] Hortense Cantrall 05/01/2017, 12:54 PM

## 2017-05-01 NOTE — Discharge Summary (Signed)
Physician Discharge Summary  LOTTIE SIGMAN IPJ:[id number] DOB: Sep 30, 1955 DOA: 04/28/2017  PCP: [name], MD  Admit date: 04/28/2017 Discharge date: 05/01/2017  Admitted From home Disposition: Home Recommendations for Outpatient Follow-up:  1. Follow up with PCP in 1-2 weeks 2. Please obtain BMP/CBC in one week  Home Health none Equipment/Devices: None Discharge Condition stable CODE STATUS: Full code Diet recommendation: Cardiac Brief/Interim Summary:62 year old female with a history of pancreatic cancer since April 2019. She is on chemotherapy every 2 weeks. She was started on amore aggressive course of chemotherapy approximately 2-3 months ago. Her last chemotherapy treatment was ended on the 16th.Since then she has had severalintermittent episodes ofsevereweakness, tiredness, nauseaand dizzyness.This be her third admission for the same. Last night she began feeling extremely tired and weak. She also felt nauseous but did not vomit. At approximately 2 AM she woke up she was diaphoretic. When get up to go to restroom she was very weak and very lightheaded. This continued to worsen the course of the day. She had low-grade fevers Tmax99.8. They finally called her oncologist who directed them to come to the ER. In the ER she is weak and is unable to hold down much p.o. intake. Her lips are dry she is occasionally lightheaded. The hospitalist havebeen asked to admit.  History provided mainly by her husband who is present at bedside. The patient is alert and oriented Admission 1/18-1/23with SIRS,diaphoresis and tachycardia-resolved, 1 blood culture positive for a coagulase-negative Staphylococcus Admission 1/24-1/26with recurrent hypotension, thought orthostatic. Negative workup. Seen by cardiology for tachycardia and hypotension and impression was chemotherapy-related side effects. Seen by pulmonology for hypotension, diaphoresis, SOB. Recommended cortisol check and  consider low-dose midodrine. 04/29/2017 patient is husband in the room. Patient had multiple admissions in the last 6 months total of 7 admissions in the last 6 months. Patient was discharged from the hospital 131 after admission for the similar symptoms with nausea or diaphoresis and vomiting. This morning she reports she does not have any feelings of nausea or has not had any overnight. However she has not moved her bowels. She has a history of chronic constipation and on top of that she is on chronic narcotics. KUB that was done today shows moderate colonic stool burden. Patient has been treated with IV fluids overnight.  04/30/2017 patient has not had a BM even after receiving lactulose as well as Dulcolax suppository.  Continues to be nauseous.  Not able to tolerate p.o.        Discharge Diagnoses:  Active Problems:   Dyslipidemia   Benign essential HTN   Nausea   Pancreatic carcinoma metastatic to liver [facility])   Chronic anticoagulation   Generalized weakness   Physical deconditioning   Dehydration   Anxiety and depression  1]with generalized weakness/physical deconditioning/nausea vomiting and diaphoresis-patient has had multiple admissions for the same.Her cortisol level is normal 3 days ago.He has a history of pancreatic carcinoma with metastases of the liver currently on chemotherapy which has been rescheduled multiple times due to recurrent hospitalization for the same symptoms. Patient has constipation and moderate colonic stool burden. I have given her lactulose fleets rectal suppositories she has had multiple bowel movements overnight repeat KUB still shows she has moderate stool burden.  No nausea vomiting she is able to tolerate p.o. intake.  She has not had any further fever.  Cultures have been negative so far.  We will discharge her on lactulose MiraLAX, and mag citrate.  Patient had fever at home has not had any  fever in the hospital.  Because of the fever is not  clear at this time.  MRI of the brain was done to rule out central etiology for her nausea vomiting.  MRI of the brain was negative.  Discussed with her to take Tylenol twice a day at home hoping that will help her symptoms.  Dr. Malachy Mood thinks that this fever is due to tumor related fever. 2]metastatic pancreatic cancer with metastases to the liver followed by Dr. Malachy Mood with ongoing chemotherapy.  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 Continue home medications.  4]type 2 diabetes continue home meds.  5]hyhhpotension chronic.Continue midodrine 5 mg 3 times a day.  6]dyslipidemia continue home medications      Discharge Instructions  Discharge Instructions    Call MD for:  difficulty breathing, headache or visual disturbances   Complete by:  As directed    Call MD for:  persistant dizziness or light-headedness   Complete by:  As directed    Call MD for:  persistant nausea and vomiting   Complete by:  As directed    Call MD for:  severe uncontrolled pain   Complete by:  As directed    Diet - low sodium heart healthy   Complete by:  As directed    Increase activity slowly   Complete by:  As directed      Allergies as of 05/01/2017      Reactions   Topamax [topiramate] Other (See Comments)   Stroke like symptoms   Aleve [naproxen Sodium] Hives   Has tolerated Voltaren topical as well as aspirin.   Bee Venom Swelling   Echinacea Hives   Other Other (See Comments)   Feathers cause sinus congestion   Sulfa Antibiotics Hives   Advil [ibuprofen] Hives   Has tolerated Voltaren topical as well as aspirin.      Medication List    STOP taking these medications   ondansetron 8 MG disintegrating tablet Commonly known as:  ZOFRAN-ODT   oxyCODONE 5 MG immediate release tablet Commonly known as:  Oxy IR/ROXICODONE     TAKE these medications   acetaminophen 325 MG tablet Commonly known as:  TYLENOL Take 2 tablets (650 mg total) by  mouth 2 (two) times daily at 10 AM and 5 PM.   aspirin EC 81 MG tablet Take 81 mg by mouth daily.   bisacodyl 5 MG EC tablet Commonly known as:  DULCOLAX Take 2 tablets (10 mg total) by mouth 2 (two) times daily.   citalopram 20 MG tablet Commonly known as:  CELEXA TAKE ONE (1) TABLET BY MOUTH EVERY DAY What changed:  See the new instructions.   CRANBERRY PLUS VITAMIN C PO Take 1 tablet by mouth daily. 4200 mg   cyclobenzaprine 5 MG tablet Commonly known as:  FLEXERIL TAKE ONE (1) TABLET BY MOUTH 3 TIMES DAILY AS NEEDED FOR MUSCLE SPASMS What changed:  See the new instructions.   eletriptan 40 MG tablet Commonly known as:  RELPAX Take 1 tablet (40 mg total) by mouth every 2 (two) hours as needed for migraine.   HYDROcodone-acetaminophen 5-325 MG tablet Commonly known as:  NORCO/VICODIN Take 1 tablet by mouth every 6 (six) hours as needed for moderate pain.   HYDROmorphone 4 MG tablet Commonly known as:  DILAUDID Take 0.5 tablets (2 mg total) by mouth every 4 (four) hours as needed for severe pain.   insulin detemir 100 unit/ml Soln Commonly known as:  LEVEMIR Inject 10 Units into  the skin at bedtime.   insulin lispro 100 UNIT/ML injection Commonly known as:  HUMALOG Inject 2-10 Units into the skin. Sliding Scale   lidocaine-prilocaine cream Commonly known as:  EMLA APPLY 1 APPLICATION TOPICALLY AS NEEDED.APPLY TO PORT A CATH SITE 1 HOUR PRIOR TO NEEDLE STICK.   LORazepam 1 MG tablet Commonly known as:  ATIVAN Take 1 tablet (1 mg total) by mouth every 8 (eight) hours as needed for anxiety or sedation (or nausea).   LYRICA 200 MG capsule Generic drug:  pregabalin Take 200 mg by mouth daily.   midodrine 5 MG tablet Commonly known as:  PROAMATINE Take 1 tablet (5 mg total) by mouth 3 (three) times daily with meals.   morphine 30 MG 12 hr tablet Commonly known as:  MS CONTIN Take 1 tablet (30 mg total) by mouth every 12 (twelve) hours.   multivitamin-lutein  Caps capsule Take 1 capsule by mouth daily.   nitroGLYCERIN 0.4 MG SL tablet Commonly known as:  NITROSTAT Place 1 tablet (0.4 mg total) under the tongue every 5 (five) minutes as needed for chest pain.   ondansetron 4 MG tablet Commonly known as:  ZOFRAN Take 1 tablet (4 mg total) by mouth every 6 (six) hours as needed for nausea.   oxybutynin 10 MG 24 hr tablet Commonly known as:  DITROPAN-XL Take 1 tablet (10 mg total) by mouth every morning.   pantoprazole 40 MG tablet Commonly known as:  PROTONIX TAKE ONE (1) TABLET BY MOUTH TWO (2) TIMES DAILY What changed:  See the new instructions.   polyethylene glycol packet Commonly known as:  MIRALAX / GLYCOLAX Take 17 g by mouth 2 (two) times daily.   prochlorperazine 10 MG tablet Commonly known as:  COMPAZINE Take 10 mg by mouth every 6 (six) hours as needed for nausea or vomiting.   QC MAGNESIUM CITRATE Soln Generic drug:  magnesium citrate Take 296 mLs by mouth once.   simethicone 125 MG chewable tablet Commonly known as:  MYLICON Chew 161-096 mg by mouth 2 (two) times daily as needed for flatulence.   sorbitol 70 % solution Take 30 mLs by mouth daily as needed (for constipation).   vitamin B-12 1000 MCG tablet Commonly known as:  CYANOCOBALAMIN Take 2,500 mcg by mouth every morning.   XARELTO 20 MG Tabs tablet Generic drug:  rivaroxaban Take 20 mg daily by mouth.       Allergies  Allergen Reactions  . Topamax [Topiramate] Other (See Comments)    Stroke like symptoms  . Aleve [Naproxen Sodium] Hives    Has tolerated Voltaren topical as well as aspirin.  . Bee Venom Swelling  . Echinacea Hives  . Other Other (See Comments)    Feathers cause sinus congestion  . Sulfa Antibiotics Hives  . Advil [Ibuprofen] Hives    Has tolerated Voltaren topical as well as aspirin.    Consultations: Dr Benay Spice  Procedures/Studies: Dg Chest 2 View  Result Date: 04/24/2017 CLINICAL DATA:  Shortness of breath and  abdominal pain today. Chills. EXAM: CHEST  2 VIEW COMPARISON:  04/20/2017 FINDINGS: Power port type central venous catheter with tip in the mid SVC region. No pneumothorax. Shallow inspiration with elevation of the hemidiaphragms. Cardiac enlargement with mild vascular congestion. Diffuse interstitial pattern to the lungs is increasing since previous study suggesting increasing edema. Linear atelectasis in the lung bases. No pneumothorax. Mediastinal contours appear intact. IMPRESSION: Shallow inspiration with elevation of the hemidiaphragms and atelectasis in the lung bases. Cardiac enlargement with  pulmonary vascular congestion and increasing interstitial edema since previous study. Electronically Signed   By: Lucienne Capers M.D.   On: 04/24/2017 23:50   Dg Chest 2 View  Result Date: 04/20/2017 CLINICAL DATA:  History of metastatic pancreatic cancer. Low blood pressure. EXAM: CHEST  2 VIEW COMPARISON:  04/19/2017 FINDINGS: Stable right-sided injectable port. Cardiomediastinal silhouette is normal. Mediastinal contours appear intact. There is no evidence of focal airspace consolidation, pleural effusion or pneumothorax. Chronic elevation of the hemidiaphragms with atelectatic changes at the lung bases. Osseous structures are without acute abnormality. Soft tissues are grossly normal. IMPRESSION: Low lung volume with chronic elevation of the hemidiaphragms and atelectatic changes at the lung bases. Electronically Signed   By: Fidela Salisbury M.D.   On: 04/20/2017 12:17   Dg Chest 2 View  Result Date: 04/19/2017 CLINICAL DATA:  Hypotension and history of pancreatic carcinoma EXAM: CHEST  2 VIEW COMPARISON:  04/13/2017 FINDINGS: Right-sided chest wall port is again seen. Elevation the right hemidiaphragm is again noted. The lungs are well aerated bilaterally. Minimal right basilar atelectasis is again seen. No new focal abnormality is noted. IMPRESSION: Stable right basilar atelectasis. Electronically  Signed   By: Inez Catalina M.D.   On: 04/19/2017 20:49   Dg Chest 2 View  Result Date: 04/13/2017 CLINICAL DATA:  Shortness of breath. EXAM: CHEST  2 VIEW COMPARISON:  Radiograph of February 23, 2017. FINDINGS: Stable cardiomediastinal silhouette. No pneumothorax is noted. Elevated right hemidiaphragm is noted with mild right basilar subsegmental atelectasis. Right internal jugular Port-A-Cath is noted with distal tip in expected position of the SVC. Left lung is clear. No significant pleural effusion is noted. Bony thorax is unremarkable. IMPRESSION: Elevated right hemidiaphragm with mild right basilar subsegmental atelectasis. No other significant abnormality is noted. Electronically Signed   By: Marijo Conception, M.D.   On: 04/13/2017 14:56   Dg Abd 1 View  Result Date: 05/01/2017 CLINICAL DATA:  Nausea. History of metastatic pancreatic malignancy. EXAM: ABDOMEN - 1 VIEW COMPARISON:  Abdominal radiographs of May 27, 2017 FINDINGS: The colonic stool and gas pattern is similar to that seen yesterday. There is no evidence of obstruction or significant ileus. There is no significant rectal gas. IMPRESSION: No evidence of bowel obstruction.  Moderate colonic stool burden. Electronically Signed   By: David  Martinique M.D.   On: 05/01/2017 07:15   Dg Abd 1 View  Result Date: 04/29/2017 CLINICAL DATA:  Constipation for the past 2-3 days. History of pancreatic cancer. EXAM: ABDOMEN - 1 VIEW COMPARISON:  04/28/2017; 04/25/2017; CT abdomen and pelvis - 04/28/2017 FINDINGS: Moderate colonic stool burden without evidence of enteric obstruction. Nondiagnostic evaluation for pneumoperitoneum secondary to supine patient positioning and exclusion of the lower thorax. Surgical mesh overlies the right lower abdomen. Post cholecystectomy. No acute osseus abnormalities. Stigmata of DISH within the lower thoracic spine. IMPRESSION: Moderate colonic stool burden without evidence of enteric obstruction. Electronically Signed   By:  Sandi Mariscal M.D.   On: 04/29/2017 10:58   Ct Angio Chest Pe W And/or Wo Contrast  Result Date: 04/28/2017 CLINICAL DATA:  Weakness and shortness of breath. History of pancreatic cancer with metastatic disease. EXAM: CT ANGIOGRAPHY CHEST WITH CONTRAST TECHNIQUE: Multidetector CT imaging of the chest was performed using the standard protocol during bolus administration of intravenous contrast. Multiplanar CT image reconstructions and MIPs were obtained to evaluate the vascular anatomy. CONTRAST:  100 mL ISOVUE-370 IOPAMIDOL (ISOVUE-370) INJECTION 76% COMPARISON:  04/13/2017 CT chest. FINDINGS: Cardiovascular: Satisfactory opacification of  the pulmonary arteries to the segmental level. No evidence of pulmonary embolism. Normal heart size. No pericardial effusion. Mediastinum/Nodes: No enlarged mediastinal, hilar, or axillary lymph nodes. Thyroid gland, trachea, and esophagus demonstrate no significant findings. Port-A-Cath good position. Lungs/Pleura: Unchanged 6 mm RIGHT upper lobe nodule. Upper Abdomen: Hepatic metastatic disease appears similar to priors. Pancreatic head and body mass redemonstrated. Musculoskeletal: No chest wall abnormality. No acute or significant osseous findings. Review of the MIP images confirms the above findings. IMPRESSION: No evidence for pulmonary emboli. No acute cardiopulmonary findings. Hepatic metastatic disease from pancreatic cancer. Study is insufficient characterize for improvement or progression. Electronically Signed   By: Staci Righter M.D.   On: 04/28/2017 15:51   Ct Angio Chest Pe W/cm &/or Wo Cm  Result Date: 04/13/2017 CLINICAL DATA:  Pancreatic carcinoma. Referral cancer center for hypotension and malaise. Chills and nausea and vomiting. Chronic RIGHT-sided abdominal pain. EXAM: CT ANGIOGRAPHY CHEST CT ABDOMEN AND PELVIS WITH CONTRAST TECHNIQUE: Multidetector CT imaging of the chest was performed using the standard protocol during bolus administration of intravenous  contrast. Multiplanar CT image reconstructions and MIPs were obtained to evaluate the vascular anatomy. Multidetector CT imaging of the abdomen and pelvis was performed using the standard protocol during bolus administration of intravenous contrast. CONTRAST:  143mL ISOVUE-370 IOPAMIDOL (ISOVUE-370) INJECTION 76% COMPARISON:  CT chest abdomen pelvis 03/11/2017 FINDINGS: CTA CHEST FINDINGS Cardiovascular: No filling defects within the pulmonary arteries arteries to suggest acute pulmonary embolism. No significant vascular findings. Normal heart size. No pericardial effusion. Mediastinum/Nodes: No axillary supraclavicular adenopathy. Port in the RIGHT chest wall with tip in distal SVC. No mediastinal hilar adenopathy. No pericardial fluid. Esophagus normal. Lungs/Pleura: 6 mm RIGHT upper lobe pulmonary nodule is not changed compared to 03/11/2017. Lesions increased in size from 12/06/2016 no new pulmonary nodules. Musculoskeletal: No aggressive osseous lesion Review of the MIP images confirms the above findings. CT ABDOMEN and PELVIS FINDINGS Hepatobiliary: Multiple round low-density lesions in liver parenchyma consistent with hepatic metastasis are not changed in short interval from 03/11/2017. No new lesions. Postcholecystectomy. Pancreas: Lesion head of pancreas measures smaller at 3.7 by 4.9 cm compared to 4.5 x 5.2 cm. There is atrophy duct dilatation the body and tail the pancreas. Spleen: Geographic low attenuation within the medial aspect the spleen is likely a profusion phenomena (image 34, series 4). Adrenals/urinary tract: Adrenal glands and kidneys are normal. The ureters and bladder normal. Stomach/Bowel: Stomach, small bowel, appendix, and cecum are normal. The colon and rectosigmoid colon are normal. Vascular/Lymphatic: Abdominal aorta is normal caliber. There is no retroperitoneal or periportal lymphadenopathy. No pelvic lymphadenopathy. Reproductive: Post hysterectomy. Other: No peritoneal nodularity  Musculoskeletal: No aggressive osseous lesion. Review of the MIP images confirms the above findings. IMPRESSION: Chest Impression: 1. No evidence acute pulmonary embolism. 2. Suspicious RIGHT upper lobe pulmonary nodule not changed in short interval from 03/11/2017. Abdomen / Pelvis Impression: 1. Interval decrease in size of pancreatic mass in short interval follow-up. 2. Stable bilobed hepatic metastasis. 3. No peritoneal metastatic disease or progressive adenopathy. Electronically Signed   By: Suzy Bouchard M.D.   On: 04/13/2017 17:55   Mr Jeri Cos JA Contrast  Result Date: 04/30/2017 CLINICAL DATA:  63 y/o F; metastatic pancreatic cancer presenting with nausea, lightheadedness, fatigue, and shortness of breath. EXAM: MRI HEAD WITHOUT AND WITH CONTRAST TECHNIQUE: Multiplanar, multiecho pulse sequences of the brain and surrounding structures were obtained without and with intravenous contrast. CONTRAST:  68mL MULTIHANCE GADOBENATE DIMEGLUMINE 529 MG/ML IV SOLN COMPARISON:  02/05/2017 MRI of the head. FINDINGS: Brain: Moderate motion artifact degrades multiple sequences. No acute infarction, hemorrhage, hydrocephalus, extra-axial collection or mass lesion. Cavum septum pellucidum. After administration of intravenous contrast there is NO abnormal enhancement of the brain Vascular: Normal flow voids. Skull and upper cervical spine: Normal marrow signal. Sinuses/Orbits: Negative. Other: None. IMPRESSION: Stable normal for age MRI of the brain.  Moderate motion artifact. Electronically Signed   By: Kristine Garbe M.D.   On: 04/30/2017 19:22   Ct Abdomen Pelvis W Contrast  Result Date: 04/28/2017 CLINICAL DATA:  Progressive abdominal pain with history of pancreatic cancer. EXAM: CT ABDOMEN AND PELVIS WITH CONTRAST TECHNIQUE: Multidetector CT imaging of the abdomen and pelvis was performed using the standard protocol following bolus administration of intravenous contrast. CONTRAST:  80 cc Isovue 300  intravenously. COMPARISON:  Abdominal CT 04/13/2017 FINDINGS: Lower chest: Atelectatic changes in the right lung base. Partially visualized 6 mm right upper lobe pulmonary nodule. Hepatobiliary: No significant change in numerous bilateral hepatic metastatic lesions. Pancreas: Decreased in size hypoattenuated body of the pancreas malignancy, now measuring approximately 4.0 by 2.5 cm. Persistent dilation of the downstream main pancreatic duct and atrophy of the tail of the pancreas. Persistent peripancreatic fat stranding. Small peripancreatic lymph nodes appear stable. Spleen: Normal in size without focal abnormality. Adrenals/Urinary Tract: Adrenal glands are unremarkable. Kidneys are normal, without renal calculi, focal lesion, or hydronephrosis. Bladder is unremarkable. Stomach/Bowel: Stomach is within normal limits. Appendix appears normal. No evidence of bowel wall thickening, distention, or inflammatory changes. Vascular/Lymphatic: Mild upper abdominal retroperitoneal lymphadenopathy. No significant vascular findings. Reproductive: Status post hysterectomy. No adnexal masses. Other: No abdominal wall hernia or abnormality. No abdominopelvic ascites. Prior ventral hernia repair. Musculoskeletal: No acute or significant osseous findings. IMPRESSION: Suboptimal timing of contrast administration. Interval mild decrease in size of pancreatic body malignancy. Mild upper abdominal retroperitoneal lymphadenopathy. No significant change in widespread hepatic metastatic disease. Electronically Signed   By: Fidela Salisbury M.D.   On: 04/28/2017 17:39   Ct Abdomen Pelvis W Contrast  Result Date: 04/13/2017 CLINICAL DATA:  Pancreatic carcinoma. Referral cancer center for hypotension and malaise. Chills and nausea and vomiting. Chronic RIGHT-sided abdominal pain. EXAM: CT ANGIOGRAPHY CHEST CT ABDOMEN AND PELVIS WITH CONTRAST TECHNIQUE: Multidetector CT imaging of the chest was performed using the standard protocol  during bolus administration of intravenous contrast. Multiplanar CT image reconstructions and MIPs were obtained to evaluate the vascular anatomy. Multidetector CT imaging of the abdomen and pelvis was performed using the standard protocol during bolus administration of intravenous contrast. CONTRAST:  1101mL ISOVUE-370 IOPAMIDOL (ISOVUE-370) INJECTION 76% COMPARISON:  CT chest abdomen pelvis 03/11/2017 FINDINGS: CTA CHEST FINDINGS Cardiovascular: No filling defects within the pulmonary arteries arteries to suggest acute pulmonary embolism. No significant vascular findings. Normal heart size. No pericardial effusion. Mediastinum/Nodes: No axillary supraclavicular adenopathy. Port in the RIGHT chest wall with tip in distal SVC. No mediastinal hilar adenopathy. No pericardial fluid. Esophagus normal. Lungs/Pleura: 6 mm RIGHT upper lobe pulmonary nodule is not changed compared to 03/11/2017. Lesions increased in size from 12/06/2016 no new pulmonary nodules. Musculoskeletal: No aggressive osseous lesion Review of the MIP images confirms the above findings. CT ABDOMEN and PELVIS FINDINGS Hepatobiliary: Multiple round low-density lesions in liver parenchyma consistent with hepatic metastasis are not changed in short interval from 03/11/2017. No new lesions. Postcholecystectomy. Pancreas: Lesion head of pancreas measures smaller at 3.7 by 4.9 cm compared to 4.5 x 5.2 cm. There is atrophy duct dilatation the body and tail the  pancreas. Spleen: Geographic low attenuation within the medial aspect the spleen is likely a profusion phenomena (image 34, series 4). Adrenals/urinary tract: Adrenal glands and kidneys are normal. The ureters and bladder normal. Stomach/Bowel: Stomach, small bowel, appendix, and cecum are normal. The colon and rectosigmoid colon are normal. Vascular/Lymphatic: Abdominal aorta is normal caliber. There is no retroperitoneal or periportal lymphadenopathy. No pelvic lymphadenopathy. Reproductive: Post  hysterectomy. Other: No peritoneal nodularity Musculoskeletal: No aggressive osseous lesion. Review of the MIP images confirms the above findings. IMPRESSION: Chest Impression: 1. No evidence acute pulmonary embolism. 2. Suspicious RIGHT upper lobe pulmonary nodule not changed in short interval from 03/11/2017. Abdomen / Pelvis Impression: 1. Interval decrease in size of pancreatic mass in short interval follow-up. 2. Stable bilobed hepatic metastasis. 3. No peritoneal metastatic disease or progressive adenopathy. Electronically Signed   By: Suzy Bouchard M.D.   On: 04/13/2017 17:55   Dg Abd Portable 1v  Result Date: 04/25/2017 CLINICAL DATA:  Abdominal pain EXAM: PORTABLE ABDOMEN - 1 VIEW COMPARISON:  CT abdomen and pelvis 04/13/2017 FINDINGS: Prior ventral hernia repair in pelvis. Surgical clips RIGHT upper quadrant related to prior cholecystectomy. Nonobstructive bowel gas pattern. No bowel dilatation or bowel wall thickening. Bones demineralized. Small pelvic phleboliths without definite urinary tract calcification. IMPRESSION: Normal bowel gas pattern. Electronically Signed   By: Lavonia Dana M.D.   On: 04/25/2017 15:37   Dg Abd Portable 2 Views  Result Date: 04/28/2017 CLINICAL DATA:  Vomiting EXAM: PORTABLE ABDOMEN - 2 VIEW COMPARISON:  04/25/2017 FINDINGS: The bowel gas pattern is normal. There is no evidence of free air. No radio-opaque calculi or other significant radiographic abnormality is seen. IMPRESSION: Negative. Electronically Signed   By: Kathreen Devoid   On: 04/28/2017 14:17    (Echo, Carotid, EGD, Colonoscopy, ERCP)    Subjective:   Discharge Exam: Vitals:   04/30/17 2008 05/01/17 0514  BP: 119/62 (!) 127/54  Pulse: 99 (!) 110  Resp: 18 19  Temp: (!) 97.5 F (36.4 C) 100 F (37.8 C)  SpO2: 98% 96%   Vitals:   04/30/17 0604 04/30/17 1327 04/30/17 2008 05/01/17 0514  BP:  119/67 119/62 (!) 127/54  Pulse:  88 99 (!) 110  Resp: 16 18 18 19   Temp: 98 F (36.7 C)  (!)  97.5 F (36.4 C) 100 F (37.8 C)  TempSrc: Oral Oral Oral Oral  SpO2: 98% 97% 98% 96%  Weight:      Height:        General: Pt is alert, awake, not in acute distress Cardiovascular: RRR, S1/S2 +, no rubs, no gallops Respiratory: CTA bilaterally, no wheezing, no rhonchi Abdominal: Soft, NT, ND, bowel sounds + Extremities: no edema, no cyanosis    The results of significant diagnostics from this hospitalization (including imaging, microbiology, ancillary and laboratory) are listed below for reference.     Microbiology: Recent Results (from the past 240 hour(s))  Culture, Blood     Status: None   Collection Time: 04/24/17  9:54 AM  Result Value Ref Range Status   Specimen Description   Final    BLOOD LEFT ARM Performed at [facility], [address] [place], [place] [address]    Special Requests   Final    NONE Performed at [facility], [place] [address], [place], [place] [address]    Culture   Final    NO GROWTH 5 DAYS Performed at [facility], [place],  [place] [address]    Report Status 04/29/2017 FINAL  Final  Culture, Blood     Status: None   Collection Time: 04/24/17 12:30 PM  Result Value Ref Range Status   Specimen Description BLOOD PORTA CATH  Final   Special Requests   Final    BOTTLES DRAWN AEROBIC AND ANAEROBIC Blood Culture adequate volume   Culture   Final    NO GROWTH 5 DAYS Performed at [facility], [address] [place], [place] [address]    Report Status 04/29/2017 FINAL  Final  Culture, blood (Routine X 2) w Reflex to ID Panel     Status: None (Preliminary result)   Collection Time: 04/28/17  9:13 PM  Result Value Ref Range Status   Specimen Description   Final    BLOOD RIGHT ANTECUBITAL Performed at [facility] [address], [place], [place] [address]    Special Requests   Final    BOTTLES DRAWN AEROBIC AND ANAEROBIC Blood Culture adequate  volume Performed at [place] [address] [place], [place] [address]    Culture   Final    NO GROWTH 1 DAY Performed at [facility], [place] [address], [place], [place] [address]    Report Status PENDING  Incomplete  Culture, blood (Routine X 2) w Reflex to ID Panel     Status: None (Preliminary result)   Collection Time: 04/28/17  9:31 PM  Result Value Ref Range Status   Specimen Description   Final    BLOOD RIGHT ANTECUBITAL Performed at [facility] [address], [place], [place] [address]    Special Requests   Final    BOTTLES DRAWN AEROBIC AND ANAEROBIC Blood Culture adequate volume Performed at [place] [address], [place], [place] [address]    Culture   Final    NO GROWTH 1 DAY Performed at [facility], [place] [address], [place], [place] [address]    Report Status PENDING  Incomplete     Labs: BNP (last 3 results) No results for input(s): BNP in the last 8760 hours. Basic Metabolic Panel: Recent Labs  Lab 04/26/17 0446 04/28/17 1327 04/29/17 0300  NA 136 132* 136  K 3.8 3.6 3.9  CL 102 100* 102  CO2 26 23 27   GLUCOSE 120* 100* 91  BUN 5* 6 5*  CREATININE 0.66 0.53 0.52  CALCIUM 9.1 8.8* 9.2   Liver Function Tests: No results for input(s): AST, ALT, ALKPHOS, BILITOT, PROT, ALBUMIN in the last 168 hours. No results for input(s): LIPASE, AMYLASE in the last 168 hours. No results for input(s): AMMONIA in the last 168 hours. CBC: Recent Labs  Lab 04/26/17 0446 04/28/17 1327 04/29/17 0300  WBC 13.3* 12.3* 10.7*  HGB 9.7* 8.8* 8.4*  HCT 31.4* 27.0* 26.1*  MCV 86.7 84.6 86.7  PLT 187 175 154   Cardiac Enzymes: No results for input(s): CKTOTAL, CKMB, CKMBINDEX, TROPONINI in the last 168 hours. BNP: Invalid input(s): POCBNP CBG: Recent Labs  Lab 04/30/17 0748 04/30/17 1212 04/30/17 1652 04/30/17 2103 05/01/17 0719  GLUCAP 201* 83 189* 155* 90   D-Dimer Recent  Labs    04/28/17 1327  DDIMER 0.59*   Hgb A1c No results for input(s): HGBA1C in the last 72 hours. Lipid Profile No results for input(s): CHOL, HDL, LDLCALC, TRIG, CHOLHDL, LDLDIRECT in the last 72 hours. Thyroid function studies No results for input(s): TSH, T4TOTAL, T3FREE, THYROIDAB in  the last 72 hours.  Invalid input(s): FREET3 Anemia work up No results for input(s): VITAMINB12, FOLATE, FERRITIN, TIBC, IRON, RETICCTPCT in the last 72 hours. Urinalysis    Component Value Date/Time   COLORURINE YELLOW 04/28/2017 1251   APPEARANCEUR CLEAR 04/28/2017 1251   LABSPEC 1.013 04/28/2017 1251   LABSPEC 1.005 03/28/2017 1319   PHURINE 9.0 (H) 04/28/2017 1251   GLUCOSEU NEGATIVE 04/28/2017 1251   GLUCOSEU Negative 03/28/2017 1319   HGBUR NEGATIVE 04/28/2017 1251   BILIRUBINUR NEGATIVE 04/28/2017 1251   BILIRUBINUR Negative 03/28/2017 1319   KETONESUR NEGATIVE 04/28/2017 1251   PROTEINUR 30 (A) 04/28/2017 1251   UROBILINOGEN 0.2 03/28/2017 1319   NITRITE NEGATIVE 04/28/2017 1251   LEUKOCYTESUR NEGATIVE 04/28/2017 1251   LEUKOCYTESUR Trace 03/28/2017 1319   Sepsis Labs Invalid input(s): PROCALCITONIN,  WBC,  LACTICIDVEN Microbiology Recent Results (from the past 240 hour(s))  Culture, Blood     Status: None   Collection Time: 04/24/17  9:54 AM  Result Value Ref Range Status   Specimen Description   Final    BLOOD LEFT ARM Performed at [facility], [place] [address], [place], [place] [address]    Special Requests   Final    NONE Performed at [facility], [place] [address], [place], [place] [address]    Culture   Final    NO GROWTH 5 DAYS Performed at [facility], [place] [address], [place], [place] [address]    Report Status 04/29/2017 FINAL  Final  Culture, Blood     Status: None   Collection Time: 04/24/17 12:30 PM  Result Value Ref Range Status   Specimen Description BLOOD PORTA CATH  Final   Special Requests    Final    BOTTLES DRAWN AEROBIC AND ANAEROBIC Blood Culture adequate volume   Culture   Final    NO GROWTH 5 DAYS Performed at [facility], [place] [address], [place], [place] [address]    Report Status 04/29/2017 FINAL  Final  Culture, blood (Routine X 2) w Reflex to ID Panel     Status: None (Preliminary result)   Collection Time: 04/28/17  9:13 PM  Result Value Ref Range Status   Specimen Description   Final    BLOOD RIGHT ANTECUBITAL Performed at [facility] [address] [place], [place] [address]    Special Requests   Final    BOTTLES DRAWN AEROBIC AND ANAEROBIC Blood Culture adequate volume Performed at [facility] [address] [place], [place] [address]    Culture   Final    NO GROWTH 1 DAY Performed at [facility], [place] [address] [place], [place] [address]    Report Status PENDING  Incomplete  Culture, blood (Routine X 2) w Reflex to ID Panel     Status: None (Preliminary result)   Collection Time: 04/28/17  9:31 PM  Result Value Ref Range Status   Specimen Description   Final    BLOOD RIGHT ANTECUBITAL Performed at [place] [address] [place], [place] [address]    Special Requests   Final    BOTTLES DRAWN AEROBIC AND ANAEROBIC Blood Culture adequate volume Performed at [facility] [address], [place], [place] [address]    Culture   Final    NO GROWTH 1 DAY Performed at [facility], [place] [address] [place], [place] [address]    Report Status PENDING  Incomplete     Time coordinating discharge: Over 30 minutes  SIGNED:   Georgette Shell, MD  [facility] 05/01/2017, 11:43 AM Pager   If 7PM-7AM, please contact night-coverage [email or web address] Password [email or web address]

## 2017-05-01 NOTE — Progress Notes (Addendum)
IP PROGRESS NOTE  Subjective:   Heather Frederick feels better.  No further fever or diaphoresis.  She continues to have pain in the upper and right lateral abdomen.  She had multiple bowel movements yesterday. Objective: Vital signs in last 24 hours: Blood pressure (!) 127/54, pulse (!) 110, temperature 100 F (37.8 C), temperature source Oral, resp. rate 19, height '5\' 3"'  (1.6 m), weight 245 lb 13 oz (111.5 kg), SpO2 96 %.  Intake/Output from previous day: 02/04 0701 - 02/05 0700 In: 2040 [P.O.:240; I.V.:1800] Out: -   Physical Exam: HEENT: No thrush Lungs: Clear bilaterally Cardiac: Regular rate and rhythm Abdomen: No mass, nontender Vascular: No leg edema  Portacath/PICC-without erythema  Lab Results: Recent Labs    04/28/17 1327 04/29/17 0300  WBC 12.3* 10.7*  HGB 8.8* 8.4*  HCT 27.0* 26.1*  PLT 175 154    BMET Recent Labs    04/28/17 1327 04/29/17 0300  NA 132* 136  K 3.6 3.9  CL 100* 102  CO2 23 27  GLUCOSE 100* 91  BUN 6 5*  CREATININE 0.53 0.52  CALCIUM 8.8* 9.2    Studies/Results: Dg Abd 1 View  Result Date: 05/01/2017 CLINICAL DATA:  Nausea. History of metastatic pancreatic malignancy. EXAM: ABDOMEN - 1 VIEW COMPARISON:  Abdominal radiographs of May 27, 2017 FINDINGS: The colonic stool and gas pattern is similar to that seen yesterday. There is no evidence of obstruction or significant ileus. There is no significant rectal gas. IMPRESSION: No evidence of bowel obstruction.  Moderate colonic stool burden. Electronically Signed   By: David  Martinique M.D.   On: 05/01/2017 07:15   Dg Abd 1 View  Result Date: 04/29/2017 CLINICAL DATA:  Constipation for the past 2-3 days. History of pancreatic cancer. EXAM: ABDOMEN - 1 VIEW COMPARISON:  04/28/2017; 04/25/2017; CT abdomen and pelvis - 04/28/2017 FINDINGS: Moderate colonic stool burden without evidence of enteric obstruction. Nondiagnostic evaluation for pneumoperitoneum secondary to supine patient positioning and  exclusion of the lower thorax. Surgical mesh overlies the right lower abdomen. Post cholecystectomy. No acute osseus abnormalities. Stigmata of DISH within the lower thoracic spine. IMPRESSION: Moderate colonic stool burden without evidence of enteric obstruction. Electronically Signed   By: Sandi Mariscal M.D.   On: 04/29/2017 10:58   Mr Jeri Cos ZO Contrast  Result Date: 04/30/2017 CLINICAL DATA:  62 y/o F; metastatic pancreatic cancer presenting with nausea, lightheadedness, fatigue, and shortness of breath. EXAM: MRI HEAD WITHOUT AND WITH CONTRAST TECHNIQUE: Multiplanar, multiecho pulse sequences of the brain and surrounding structures were obtained without and with intravenous contrast. CONTRAST:  94m MULTIHANCE GADOBENATE DIMEGLUMINE 529 MG/ML IV SOLN COMPARISON:  02/05/2017 MRI of the head. FINDINGS: Brain: Moderate motion artifact degrades multiple sequences. No acute infarction, hemorrhage, hydrocephalus, extra-axial collection or mass lesion. Cavum septum pellucidum. After administration of intravenous contrast there is NO abnormal enhancement of the brain Vascular: Normal flow voids. Skull and upper cervical spine: Normal marrow signal. Sinuses/Orbits: Negative. Other: None. IMPRESSION: Stable normal for age MRI of the brain.  Moderate motion artifact. Electronically Signed   By: LKristine GarbeM.D.   On: 04/30/2017 19:22    Medications: I have reviewed the patient's current medications.  Assessment/Plan:  1. Metastatic pancreas cancer ? Pancreas body mass and liver metastases noted on CT abdomen/pelvis 08/11/2016 ? Ultrasound-guided biopsy of a right liver lesion 08/16/2016 revealed poorly differentiated adenocarcinoma consistent with pancreas cancer ? Foundation 1-microsatellite stable; tumor mutational burden 1; ERBB2 amplification ? Cycle 1 gemcitabine/Abraxane 09/13/2016; 09/29/2016 ?  Cycle 2 gemcitabine/Abraxane 10/11/2016, 10/18/2016 ? Cycle 3 gemcitabine/Abraxane 11/01/2016,  11/08/2016 ? Cycle 4 gemcitabine/Abraxane 11/23/2016,11/29/2016 ? CT chest 12/06/2016-liver lesions appear smaller ? Cycle 5 gemcitabine/Abraxane 12/13/2016 ? CT abdomen/pelvis 01/01/2017-new and enlarging hepatic masses. Enlarging pancreatic mass. ? Cycle 6 gemcitabine/Abraxane 01/03/2017 ? Rising CA 19-9, increased pain October 2018 ? Cycle 1 FOLFOX 01/31/2017 ? Cycle 2 FOLFOX 02/19/2017 ? Cycle 3FOLFIRINOX 03/06/2017 ? CT 03/11/2017 (compared to 01/01/2017) increased liver lesions and enlargement of the pancreas mass ? Cycle4FOLFIRINOX 03/21/2017 (oxaliplatin dose reduced and Neulasta added) ? Cycle 5 FOLFIRINOX 04/09/2017 ? CT 04/13/2017- decrease in the size of the pancreas mass, no change in multiple liver lesions  2. Pain secondary to pancreas cancer, MS Contin added 03/13/2017  3. Hypertension  4. Sleep apnea  5. Chronic low back pain  6. Recurrent urinary tract infections  7. Depression  8. Migraine headaches  9. Port-A-Cath placement 08/28/2016  10. Rash following cycle 1 gemcitabine/Abraxane-drug rash?  11. Nausea/vomiting following cycle 1 gemcitabine/Abraxane-antiemetic regimen adjusted with addition of Aloxi  12. Diabetes  13. Right cephalic vein thrombosis [id number] with Xarelto  14. CT chest 12/06/2016 done to evaluate dyspnea-several 3-4 mm nodular opacities in the lung parenchyma etiology uncertain. Known mass in the body of the pancreas measuring 3.3 x 2.4 cm;small enhancing lesion in the anterior dome of the liver measures 8 mm.  15. Right forearm rash following cycle 5 day 8 gemcitabine/Abraxane. Resolved  16.Klebsiella urinary tract infection 03/14/2017  17.Admission 04/13/2017 with diaphoresis and tachycardia-resolved, 1 blood culture positive for a coagulase-negative Staphylococcus  18. Admission 04/19/2017 with recurrent hypotension  19.  Admission 04/24/2017 with recurrent hypotension and  diaphoresis  20.  Admission 04/28/2017 with diaphoresis, fever, and weakness  Heather Frederick appears well today.  The etiology of her recurrent admissions with diaphoresis, fever, and "weakness "remains unclear.  Cultures remain negative and there is no apparent source for infection.  I reviewed the 04/28/2017 CT images with a radiologist this morning.  The pancreas mass appears smaller and some of the liver lesions appear smaller compared to a CT from December.  I suspect the fever and diaphoresis may be related to "tumor "fever.  I recommend a nonsteroidal anti-inflammatory medication twice daily, but she states she is unable to take naproxen or ibuprofen secondary to allergies.  I recommend she begin twice daily Tylenol.  She can continue MS Contin and Dilaudid for pain.  She should continue a bowel regimen at home.  She appears stable for discharge from an oncology standpoint.  She is scheduled for a follow-up visit at the Cancer center on 05/07/2017      LOS: 1 day   Betsy Coder, MD   05/01/2017, 7:26 AM

## 2017-05-02 ENCOUNTER — Encounter: Payer: Self-pay | Admitting: Oncology

## 2017-05-02 NOTE — Progress Notes (Signed)
Pt's Morphine Sul Tab 30MG  ER is approved through 05/01/2019.  Request Reference Number: [id number].

## 2017-05-04 ENCOUNTER — Inpatient Hospital Stay (HOSPITAL_COMMUNITY)
Admission: AD | Admit: 2017-05-04 | Discharge: 2017-05-09 | DRG: 982 | Disposition: A | Discharge status: Hospice | Payer: 59 | Source: Ambulatory Visit | Attending: Internal Medicine | Admitting: Internal Medicine

## 2017-05-04 ENCOUNTER — Encounter (HOSPITAL_COMMUNITY): Payer: Self-pay | Admitting: *Deleted

## 2017-05-04 ENCOUNTER — Inpatient Hospital Stay: Payer: 59

## 2017-05-04 ENCOUNTER — Telehealth: Payer: Self-pay | Admitting: *Deleted

## 2017-05-04 ENCOUNTER — Other Ambulatory Visit: Payer: Self-pay

## 2017-05-04 ENCOUNTER — Inpatient Hospital Stay: Payer: 59 | Attending: Nurse Practitioner | Admitting: Nurse Practitioner

## 2017-05-04 VITALS — BP 94/48 | HR 106 | Temp 97.7°F | Resp 18 | Ht 63.0 in | Wt 244.6 lb

## 2017-05-04 DIAGNOSIS — I10 Essential (primary) hypertension: Secondary | ICD-10-CM | POA: Insufficient documentation

## 2017-05-04 DIAGNOSIS — E669 Obesity, unspecified: Secondary | ICD-10-CM | POA: Diagnosis present

## 2017-05-04 DIAGNOSIS — Z882 Allergy status to sulfonamides status: Secondary | ICD-10-CM

## 2017-05-04 DIAGNOSIS — E119 Type 2 diabetes mellitus without complications: Secondary | ICD-10-CM | POA: Insufficient documentation

## 2017-05-04 DIAGNOSIS — E1122 Type 2 diabetes mellitus with diabetic chronic kidney disease: Secondary | ICD-10-CM | POA: Diagnosis present

## 2017-05-04 DIAGNOSIS — Z91030 Bee allergy status: Secondary | ICD-10-CM

## 2017-05-04 DIAGNOSIS — C259 Malignant neoplasm of pancreas, unspecified: Secondary | ICD-10-CM

## 2017-05-04 DIAGNOSIS — D649 Anemia, unspecified: Secondary | ICD-10-CM | POA: Diagnosis present

## 2017-05-04 DIAGNOSIS — I951 Orthostatic hypotension: Secondary | ICD-10-CM | POA: Diagnosis present

## 2017-05-04 DIAGNOSIS — C772 Secondary and unspecified malignant neoplasm of intra-abdominal lymph nodes: Secondary | ICD-10-CM | POA: Diagnosis present

## 2017-05-04 DIAGNOSIS — Z7901 Long term (current) use of anticoagulants: Secondary | ICD-10-CM

## 2017-05-04 DIAGNOSIS — M545 Low back pain: Secondary | ICD-10-CM | POA: Insufficient documentation

## 2017-05-04 DIAGNOSIS — R32 Unspecified urinary incontinence: Secondary | ICD-10-CM | POA: Diagnosis present

## 2017-05-04 DIAGNOSIS — R4182 Altered mental status, unspecified: Secondary | ICD-10-CM | POA: Diagnosis present

## 2017-05-04 DIAGNOSIS — Z9221 Personal history of antineoplastic chemotherapy: Secondary | ICD-10-CM | POA: Diagnosis not present

## 2017-05-04 DIAGNOSIS — C787 Secondary malignant neoplasm of liver and intrahepatic bile duct: Principal | ICD-10-CM

## 2017-05-04 DIAGNOSIS — Z66 Do not resuscitate: Secondary | ICD-10-CM | POA: Diagnosis not present

## 2017-05-04 DIAGNOSIS — Z87440 Personal history of urinary (tract) infections: Secondary | ICD-10-CM | POA: Diagnosis not present

## 2017-05-04 DIAGNOSIS — G43909 Migraine, unspecified, not intractable, without status migrainosus: Secondary | ICD-10-CM | POA: Insufficient documentation

## 2017-05-04 DIAGNOSIS — B961 Klebsiella pneumoniae [K. pneumoniae] as the cause of diseases classified elsewhere: Secondary | ICD-10-CM | POA: Diagnosis not present

## 2017-05-04 DIAGNOSIS — G4733 Obstructive sleep apnea (adult) (pediatric): Secondary | ICD-10-CM | POA: Diagnosis present

## 2017-05-04 DIAGNOSIS — F329 Major depressive disorder, single episode, unspecified: Secondary | ICD-10-CM | POA: Diagnosis not present

## 2017-05-04 DIAGNOSIS — R7989 Other specified abnormal findings of blood chemistry: Secondary | ICD-10-CM | POA: Diagnosis present

## 2017-05-04 DIAGNOSIS — R509 Fever, unspecified: Secondary | ICD-10-CM

## 2017-05-04 DIAGNOSIS — K219 Gastro-esophageal reflux disease without esophagitis: Secondary | ICD-10-CM | POA: Diagnosis present

## 2017-05-04 DIAGNOSIS — C251 Malignant neoplasm of body of pancreas: Secondary | ICD-10-CM | POA: Diagnosis present

## 2017-05-04 DIAGNOSIS — N3289 Other specified disorders of bladder: Secondary | ICD-10-CM | POA: Diagnosis present

## 2017-05-04 DIAGNOSIS — I7300 Raynaud's syndrome without gangrene: Secondary | ICD-10-CM | POA: Diagnosis present

## 2017-05-04 DIAGNOSIS — N390 Urinary tract infection, site not specified: Secondary | ICD-10-CM | POA: Diagnosis present

## 2017-05-04 DIAGNOSIS — G8929 Other chronic pain: Secondary | ICD-10-CM | POA: Diagnosis not present

## 2017-05-04 DIAGNOSIS — Z91048 Other nonmedicinal substance allergy status: Secondary | ICD-10-CM

## 2017-05-04 DIAGNOSIS — E785 Hyperlipidemia, unspecified: Secondary | ICD-10-CM | POA: Diagnosis present

## 2017-05-04 DIAGNOSIS — E1151 Type 2 diabetes mellitus with diabetic peripheral angiopathy without gangrene: Secondary | ICD-10-CM | POA: Diagnosis present

## 2017-05-04 DIAGNOSIS — E118 Type 2 diabetes mellitus with unspecified complications: Secondary | ICD-10-CM | POA: Diagnosis not present

## 2017-05-04 DIAGNOSIS — Z888 Allergy status to other drugs, medicaments and biological substances status: Secondary | ICD-10-CM

## 2017-05-04 DIAGNOSIS — R5081 Fever presenting with conditions classified elsewhere: Secondary | ICD-10-CM | POA: Diagnosis present

## 2017-05-04 DIAGNOSIS — G893 Neoplasm related pain (acute) (chronic): Secondary | ICD-10-CM | POA: Diagnosis present

## 2017-05-04 DIAGNOSIS — I959 Hypotension, unspecified: Secondary | ICD-10-CM | POA: Diagnosis not present

## 2017-05-04 DIAGNOSIS — R945 Abnormal results of liver function studies: Secondary | ICD-10-CM

## 2017-05-04 DIAGNOSIS — Z6841 Body Mass Index (BMI) 40.0 and over, adult: Secondary | ICD-10-CM | POA: Diagnosis not present

## 2017-05-04 DIAGNOSIS — G4730 Sleep apnea, unspecified: Secondary | ICD-10-CM | POA: Insufficient documentation

## 2017-05-04 DIAGNOSIS — Z886 Allergy status to analgesic agent status: Secondary | ICD-10-CM

## 2017-05-04 DIAGNOSIS — G43809 Other migraine, not intractable, without status migrainosus: Secondary | ICD-10-CM | POA: Diagnosis not present

## 2017-05-04 DIAGNOSIS — I129 Hypertensive chronic kidney disease with stage 1 through stage 4 chronic kidney disease, or unspecified chronic kidney disease: Secondary | ICD-10-CM | POA: Diagnosis present

## 2017-05-04 DIAGNOSIS — F32A Depression, unspecified: Secondary | ICD-10-CM | POA: Diagnosis present

## 2017-05-04 DIAGNOSIS — R410 Disorientation, unspecified: Secondary | ICD-10-CM | POA: Diagnosis not present

## 2017-05-04 DIAGNOSIS — R400 Somnolence: Secondary | ICD-10-CM | POA: Diagnosis not present

## 2017-05-04 DIAGNOSIS — G894 Chronic pain syndrome: Secondary | ICD-10-CM | POA: Diagnosis present

## 2017-05-04 DIAGNOSIS — Z800 Family history of malignant neoplasm of digestive organs: Secondary | ICD-10-CM

## 2017-05-04 DIAGNOSIS — Z7982 Long term (current) use of aspirin: Secondary | ICD-10-CM

## 2017-05-04 DIAGNOSIS — D696 Thrombocytopenia, unspecified: Secondary | ICD-10-CM | POA: Diagnosis present

## 2017-05-04 DIAGNOSIS — F419 Anxiety disorder, unspecified: Secondary | ICD-10-CM | POA: Diagnosis not present

## 2017-05-04 DIAGNOSIS — R000 Tachycardia, unspecified: Secondary | ICD-10-CM | POA: Insufficient documentation

## 2017-05-04 DIAGNOSIS — F418 Other specified anxiety disorders: Secondary | ICD-10-CM | POA: Diagnosis present

## 2017-05-04 DIAGNOSIS — N189 Chronic kidney disease, unspecified: Secondary | ICD-10-CM | POA: Diagnosis present

## 2017-05-04 DIAGNOSIS — N3000 Acute cystitis without hematuria: Secondary | ICD-10-CM | POA: Diagnosis not present

## 2017-05-04 DIAGNOSIS — Z96652 Presence of left artificial knee joint: Secondary | ICD-10-CM | POA: Diagnosis present

## 2017-05-04 DIAGNOSIS — Z794 Long term (current) use of insulin: Secondary | ICD-10-CM

## 2017-05-04 DIAGNOSIS — Z86718 Personal history of other venous thrombosis and embolism: Secondary | ICD-10-CM

## 2017-05-04 DIAGNOSIS — Z79899 Other long term (current) drug therapy: Secondary | ICD-10-CM

## 2017-05-04 DIAGNOSIS — Z803 Family history of malignant neoplasm of breast: Secondary | ICD-10-CM

## 2017-05-04 LAB — CMP (CANCER CENTER ONLY)
ALT: 24 U/L (ref 0–55)
ANION GAP: 8 (ref 3–11)
AST: 41 U/L — ABNORMAL HIGH (ref 5–34)
Albumin: 2.4 g/dL — ABNORMAL LOW (ref 3.5–5.0)
Alkaline Phosphatase: 255 U/L — ABNORMAL HIGH (ref 40–150)
BUN: 5 mg/dL — ABNORMAL LOW (ref 7–26)
CHLORIDE: 96 mmol/L — AB (ref 98–109)
CO2: 30 mmol/L — AB (ref 22–29)
Calcium: 10.4 mg/dL (ref 8.4–10.4)
Creatinine: 0.61 mg/dL (ref 0.60–1.10)
GFR, Est AFR Am: >60 mL/min (ref >60)
Glucose, Bld: 102 mg/dL (ref 70–140)
Potassium: 4.2 mmol/L (ref 3.5–5.1)
SODIUM: 134 mmol/L — AB (ref 136–145)
Total Bilirubin: 0.9 mg/dL (ref 0.2–1.2)
Total Protein: 6.4 g/dL (ref 6.4–8.3)

## 2017-05-04 LAB — URINALYSIS, COMPLETE (UACMP) WITH MICROSCOPIC
Bilirubin Urine: NEGATIVE
Glucose, UA: NEGATIVE mg/dL
Hgb urine dipstick: NEGATIVE
Ketones, ur: NEGATIVE mg/dL
Nitrite: NEGATIVE
PROTEIN: 30 mg/dL — AB
SPECIFIC GRAVITY, URINE: 1.017 (ref 1.005–1.030)
pH: 6 (ref 5.0–8.0)

## 2017-05-04 LAB — AMMONIA: AMMONIA: 21 umol/L (ref 9–35)

## 2017-05-04 LAB — CULTURE, BLOOD (ROUTINE X 2)
CULTURE: NO GROWTH
Culture: NO GROWTH
Special Requests: ADEQUATE
Special Requests: ADEQUATE

## 2017-05-04 LAB — URINALYSIS, MICROSCOPIC (REFLEX)

## 2017-05-04 LAB — CBC WITH DIFFERENTIAL (CANCER CENTER ONLY)
BASOS PCT: 1 %
Basophils Absolute: 0.1 10*3/uL (ref 0.0–0.1)
Eosinophils Absolute: 0.4 10*3/uL (ref 0.0–0.5)
Eosinophils Relative: 3 %
HEMATOCRIT: 28.6 % — AB (ref 34.8–46.6)
HEMOGLOBIN: 9 g/dL — AB (ref 11.6–15.9)
LYMPHS ABS: 0.9 10*3/uL (ref 0.9–3.3)
Lymphocytes Relative: 8 %
MCH: 27 pg (ref 25.1–34.0)
MCHC: 31.3 g/dL — ABNORMAL LOW (ref 31.5–36.0)
MCV: 86.3 fL (ref 79.5–101.0)
MONOS PCT: 13 %
Monocytes Absolute: 1.7 10*3/uL — ABNORMAL HIGH (ref 0.1–0.9)
NEUTROS ABS: 9.3 10*3/uL — AB (ref 1.5–6.5)
NEUTROS PCT: 75 %
Platelet Count: 184 10*3/uL (ref 145–400)
RBC: 3.32 MIL/uL — AB (ref 3.70–5.45)
RDW: 20.5 % — ABNORMAL HIGH (ref 11.2–14.5)
WBC: 12.4 10*3/uL — AB (ref 3.9–10.3)

## 2017-05-04 LAB — GLUCOSE, CAPILLARY: GLUCOSE-CAPILLARY: 157 mg/dL — AB (ref 65–99)

## 2017-05-04 MED ORDER — ASPIRIN EC 81 MG PO TBEC
81.0000 mg | DELAYED_RELEASE_TABLET | Freq: Every day | ORAL | Status: DC
  Administered 2017-05-05 – 2017-05-08 (×4): 81 mg via ORAL
  Filled 2017-05-04 (×4): qty 1

## 2017-05-04 MED ORDER — OXYBUTYNIN CHLORIDE ER 5 MG PO TB24
10.0000 mg | ORAL_TABLET | Freq: Every morning | ORAL | Status: DC
  Administered 2017-05-05 – 2017-05-09 (×5): 10 mg via ORAL
  Filled 2017-05-04 (×5): qty 2

## 2017-05-04 MED ORDER — HYDROMORPHONE HCL 2 MG PO TABS
2.0000 mg | ORAL_TABLET | ORAL | Status: DC | PRN
Start: 2017-05-04 — End: 2017-05-04

## 2017-05-04 MED ORDER — SODIUM CHLORIDE 0.9 % IJ SOLN
10.0000 mL | Freq: Once | INTRAMUSCULAR | Status: AC
  Administered 2017-05-04: 10 mL
  Filled 2017-05-04: qty 10

## 2017-05-04 MED ORDER — OXYCODONE HCL 5 MG PO TABS
10.0000 mg | ORAL_TABLET | Freq: Once | ORAL | Status: AC
  Administered 2017-05-04: 10 mg via ORAL
  Filled 2017-05-04: qty 2

## 2017-05-04 MED ORDER — OCUVITE-LUTEIN PO CAPS
1.0000 | ORAL_CAPSULE | Freq: Every day | ORAL | Status: DC
  Administered 2017-05-05: 1 via ORAL
  Filled 2017-05-04 (×2): qty 1

## 2017-05-04 MED ORDER — ENSURE ENLIVE PO LIQD
237.0000 mL | Freq: Two times a day (BID) | ORAL | Status: DC
  Administered 2017-05-05 – 2017-05-09 (×3): 237 mL via ORAL

## 2017-05-04 MED ORDER — ACETAMINOPHEN 650 MG RE SUPP: 650.0000 mg | Freq: Four times a day (QID) | RECTAL | Status: DC | PRN

## 2017-05-04 MED ORDER — INSULIN DETEMIR 100 UNIT/ML ~~LOC~~ SOLN
10.0000 [IU] | Freq: Every day | SUBCUTANEOUS | Status: DC
  Administered 2017-05-04 – 2017-05-07 (×4): 10 [IU] via SUBCUTANEOUS
  Filled 2017-05-04 (×5): qty 0.1

## 2017-05-04 MED ORDER — SIMETHICONE 80 MG PO CHEW
80.0000 mg | CHEWABLE_TABLET | Freq: Four times a day (QID) | ORAL | Status: DC | PRN
  Administered 2017-05-05 – 2017-05-06 (×3): 80 mg via ORAL
  Filled 2017-05-04 (×3): qty 1

## 2017-05-04 MED ORDER — ONDANSETRON HCL 4 MG/2ML IJ SOLN
4.0000 mg | Freq: Four times a day (QID) | INTRAMUSCULAR | Status: DC | PRN
  Administered 2017-05-08: 4 mg via INTRAVENOUS
  Filled 2017-05-04: qty 2

## 2017-05-04 MED ORDER — BISACODYL 5 MG PO TBEC
10.0000 mg | DELAYED_RELEASE_TABLET | Freq: Two times a day (BID) | ORAL | Status: DC
  Administered 2017-05-04 – 2017-05-08 (×9): 10 mg via ORAL
  Filled 2017-05-04 (×9): qty 2

## 2017-05-04 MED ORDER — ACETAMINOPHEN 325 MG PO TABS
650.0000 mg | ORAL_TABLET | Freq: Four times a day (QID) | ORAL | Status: DC | PRN
  Administered 2017-05-04 – 2017-05-09 (×4): 650 mg via ORAL
  Filled 2017-05-04 (×4): qty 2

## 2017-05-04 MED ORDER — OXYCODONE HCL 5 MG PO TABS: 5.0000 mg | ORAL_TABLET | Freq: Four times a day (QID) | ORAL | 0 refills | Status: DC | PRN

## 2017-05-04 MED ORDER — PREGABALIN 75 MG PO CAPS
200.0000 mg | ORAL_CAPSULE | Freq: Every day | ORAL | Status: DC
  Administered 2017-05-05 – 2017-05-09 (×5): 200 mg via ORAL
  Filled 2017-05-04 (×5): qty 2

## 2017-05-04 MED ORDER — VITAMIN B-12 1000 MCG PO TABS
2500.0000 ug | ORAL_TABLET | Freq: Every day | ORAL | Status: DC
  Administered 2017-05-05 – 2017-05-09 (×5): 2500 ug via ORAL
  Filled 2017-05-04 (×5): qty 3

## 2017-05-04 MED ORDER — LORAZEPAM 1 MG PO TABS
1.0000 mg | ORAL_TABLET | Freq: Three times a day (TID) | ORAL | Status: DC | PRN
  Administered 2017-05-06 – 2017-05-07 (×4): 1 mg via ORAL
  Filled 2017-05-04 (×4): qty 1

## 2017-05-04 MED ORDER — CEFTRIAXONE SODIUM 1 G IJ SOLR
1.0000 g | INTRAMUSCULAR | Status: DC
  Administered 2017-05-04 – 2017-05-05 (×2): 1 g via INTRAVENOUS
  Filled 2017-05-04 (×3): qty 10

## 2017-05-04 MED ORDER — PANTOPRAZOLE SODIUM 40 MG PO TBEC
40.0000 mg | DELAYED_RELEASE_TABLET | Freq: Two times a day (BID) | ORAL | Status: DC
  Administered 2017-05-04 – 2017-05-09 (×10): 40 mg via ORAL
  Filled 2017-05-04 (×10): qty 1

## 2017-05-04 MED ORDER — SODIUM CHLORIDE 0.9 % IV SOLN
Freq: Once | INTRAVENOUS | Status: AC
  Administered 2017-05-04: 15:00:00 via INTRAVENOUS

## 2017-05-04 MED ORDER — MORPHINE SULFATE ER 30 MG PO TBCR
30.0000 mg | EXTENDED_RELEASE_TABLET | Freq: Two times a day (BID) | ORAL | Status: DC
  Administered 2017-05-04 – 2017-05-09 (×10): 30 mg via ORAL
  Filled 2017-05-04 (×10): qty 1

## 2017-05-04 MED ORDER — RIVAROXABAN 20 MG PO TABS
20.0000 mg | ORAL_TABLET | Freq: Every day | ORAL | Status: DC
  Administered 2017-05-05 – 2017-05-07 (×4): 20 mg via ORAL
  Filled 2017-05-04 (×3): qty 1

## 2017-05-04 MED ORDER — CYCLOBENZAPRINE HCL 5 MG PO TABS
5.0000 mg | ORAL_TABLET | Freq: Three times a day (TID) | ORAL | Status: DC | PRN
  Administered 2017-05-05 – 2017-05-09 (×6): 5 mg via ORAL
  Filled 2017-05-04 (×7): qty 1

## 2017-05-04 MED ORDER — OXYCODONE HCL 5 MG PO TABS
5.0000 mg | ORAL_TABLET | Freq: Four times a day (QID) | ORAL | Status: DC | PRN
  Administered 2017-05-04 – 2017-05-05 (×2): 5 mg via ORAL
  Filled 2017-05-04 (×2): qty 1

## 2017-05-04 MED ORDER — POLYETHYLENE GLYCOL 3350 17 G PO PACK
17.0000 g | PACK | Freq: Two times a day (BID) | ORAL | Status: DC
  Administered 2017-05-04 – 2017-05-09 (×5): 17 g via ORAL
  Filled 2017-05-04 (×7): qty 1

## 2017-05-04 MED ORDER — ELETRIPTAN HYDROBROMIDE 40 MG PO TABS
40.0000 mg | ORAL_TABLET | ORAL | Status: DC | PRN
  Filled 2017-05-04: qty 1

## 2017-05-04 MED ORDER — MIDODRINE HCL 5 MG PO TABS
5.0000 mg | ORAL_TABLET | Freq: Three times a day (TID) | ORAL | Status: DC
  Administered 2017-05-05 – 2017-05-09 (×13): 5 mg via ORAL
  Filled 2017-05-04 (×13): qty 1

## 2017-05-04 MED ORDER — LACTULOSE 10 GM/15ML PO SOLN
20.0000 g | Freq: Every day | ORAL | Status: DC
  Administered 2017-05-05 – 2017-05-07 (×3): 20 g via ORAL
  Filled 2017-05-04 (×3): qty 30

## 2017-05-04 MED ORDER — SODIUM CHLORIDE 0.9 % IV SOLN
INTRAVENOUS | Status: AC
  Administered 2017-05-04: 20:00:00 via INTRAVENOUS

## 2017-05-04 MED ORDER — SORBITOL 70 % PO SOLN
30.0000 mL | Freq: Every day | ORAL | Status: DC | PRN
  Filled 2017-05-04: qty 30

## 2017-05-04 MED ORDER — SODIUM CHLORIDE 0.9 % IV SOLN
INTRAVENOUS | Status: AC
  Administered 2017-05-04: 13:00:00 via INTRAVENOUS

## 2017-05-04 MED ORDER — CITALOPRAM HYDROBROMIDE 20 MG PO TABS
20.0000 mg | ORAL_TABLET | Freq: Every day | ORAL | Status: DC
  Administered 2017-05-05 – 2017-05-09 (×5): 20 mg via ORAL
  Filled 2017-05-04 (×5): qty 1

## 2017-05-04 MED FILL — oxyCODONE HCL 5 MG TABS: 5 | 9 days supply | Qty: 75 | Fill #0

## 2017-05-04 NOTE — Progress Notes (Addendum)
ANTICOAGULATION CONSULT NOTE - Initial Consult  Pharmacy Consult for rivaroxaban Indication: DVT - previous history  Allergies  Allergen Reactions  . Topamax [Topiramate] Other (See Comments)    Stroke like symptoms  . Aleve [Naproxen Sodium] Hives    Has tolerated Voltaren topical as well as aspirin.  . Bee Venom Swelling  . Echinacea Hives  . Other Other (See Comments)    Feathers cause sinus congestion  . Sulfa Antibiotics Hives  . Advil [Ibuprofen] Hives    Has tolerated Voltaren topical as well as aspirin.    Patient Measurements: Height: 5\' 3"  (160 cm) Weight: 249 lb 1.9 oz (113 kg) IBW/kg (Calculated) : 52.4 Heparin Dosing Weight:   Vital Signs: Temp: 98.1 F (36.7 C) (02/08 1814) Temp Source: Oral (02/08 1821) BP: 102/60 (02/08 1814) Pulse Rate: 101 (02/08 1814)  Labs: Recent Labs    05/04/17 1140  HCT 28.6*  PLT 184  CREATININE 0.61    Estimated Creatinine Clearance: 91.6 mL/min (by C-G formula based on SCr of 0.61 mg/dL).   Medical History: Past Medical History:  Diagnosis Date  . Allergy   . Anxiety   . Arthritis   . Benign essential HTN 09/23/2014  . Cancer ([place])   . Chronic kidney disease    uti  . Depression   . Dyslipidemia   . Elevated liver enzymes   . Family history of anesthesia complication    father has a severe hard time waking up  . Gallstones    a. Seen on CT 01/2014.  Marland Kitchen GERD (gastroesophageal reflux disease)   . Hard of hearing   . Hepatic steatosis   . History of frequent urinary tract infections   . Hyperlipidemia   . Hypertension   . Lateral epicondylitis of right elbow   . Mental disorder   . Meralgia paresthetica of right side 10/02/2012   slight at 05/2014  . Migraine headache   . Obesity   . OSA (obstructive sleep apnea)    severe with AHI 37/hr now on CPAP at 18cm H2O  . Osteoarthritis   . Pancreatic cancer metastasized to liver ([place])   . Pneumonia    Feb 2018  . Raynaud disease    in feet per patient   .  Sepsis ([place]) 02/23/2017  . Sleep apnea    wears c-pap  . Urinary tract infection     Assessment: Heather Frederick with pancreatic cancer and history of DVT 10/2016 on rivaroxaban 20mg  daily (Last dose 2/8 0830).  Pharmacy asked to dose rivaroxaban (does not appear to have an acute event)  Today, 05/04/2017   Renal - SCr WNL  CBC: Hgb stable, pltc WNL  Goal of Therapy:   Dose per indication   Plan:   Continue rivaroxaban 20mg  daily  Patient takes with breakfast  Pharmacy to follow peripherally  Doreene Eland, PharmD, BCPS.   Pager: [phone] 05/04/2017 7:26 PM

## 2017-05-04 NOTE — Patient Instructions (Signed)
Dehydration, Adult Dehydration is when there is not enough fluid or water in your body. This happens when you lose more fluids than you take in. Dehydration can range from mild to very bad. It should be treated right away to keep it from getting very bad. Symptoms of mild dehydration may include:  Thirst.  Dry lips.  Slightly dry mouth.  Dry, warm skin.  Dizziness. Symptoms of moderate dehydration may include:  Very dry mouth.  Muscle cramps.  Dark pee (urine). Pee may be the color of tea.  Your body making less pee.  Your eyes making fewer tears.  Heartbeat that is uneven or faster than normal (palpitations).  Headache.  Light-headedness, especially when you stand up from sitting.  Fainting (syncope). Symptoms of very bad dehydration may include:  Changes in skin, such as: ? Cold and clammy skin. ? Blotchy (mottled) or pale skin. ? Skin that does not quickly return to normal after being lightly pinched and let go (poor skin turgor).  Changes in body fluids, such as: ? Feeling very thirsty. ? Your eyes making fewer tears. ? Not sweating when body temperature is high, such as in hot weather. ? Your body making very little pee.  Changes in vital signs, such as: ? Weak pulse. ? Pulse that is more than 100 beats a minute when you are sitting still. ? Fast breathing. ? Low blood pressure.  Other changes, such as: ? Sunken eyes. ? Cold hands and feet. ? Confusion. ? Lack of energy (lethargy). ? Trouble waking up from sleep. ? Short-term weight loss. ? Unconsciousness. Follow these instructions at home:  If told by your doctor, drink an ORS: ? Make an ORS by using instructions on the package. ? Start by drinking small amounts, about  cup (120 mL) every 5-10 minutes. ? Slowly drink more until you have had the amount that your doctor said to have.  Drink enough clear fluid to keep your pee clear or pale yellow. If you were told to drink an ORS, finish the ORS  first, then start slowly drinking clear fluids. Drink fluids such as: ? Water. Do not drink only water by itself. Doing that can make the salt (sodium) level in your body get too low (hyponatremia). ? Ice chips. ? Fruit juice that you have added water to (diluted). ? Low-calorie sports drinks.  Avoid: ? Alcohol. ? Drinks that have a lot of sugar. These include high-calorie sports drinks, fruit juice that does not have water added, and soda. ? Caffeine. ? Foods that are greasy or have a lot of fat or sugar.  Take over-the-counter and prescription medicines only as told by your doctor.  Do not take salt tablets. Doing that can make the salt level in your body get too high (hypernatremia).  Eat foods that have minerals (electrolytes). Examples include bananas, oranges, potatoes, tomatoes, and spinach.  Keep all follow-up visits as told by your doctor. This is important. Contact a doctor if:  You have belly (abdominal) pain that: ? Gets worse. ? Stays in one area (localizes).  You have a rash.  You have a stiff neck.  You get angry or annoyed more easily than normal (irritability).  You are more sleepy than normal.  You have a harder time waking up than normal.  You feel: ? Weak. ? Dizzy. ? Very thirsty.  You have peed (urinated) only a small amount of very dark pee during 6-8 hours. Get help right away if:  You have symptoms of   very bad dehydration.  You cannot drink fluids without throwing up (vomiting).  Your symptoms get worse with treatment.  You have a fever.  You have a very bad headache.  You are throwing up or having watery poop (diarrhea) and it: ? Gets worse. ? Does not go away.  You have blood or something green (bile) in your throw-up.  You have blood in your poop (stool). This may cause poop to look black and tarry.  You have not peed in 6-8 hours.  You pass out (faint).  Your heart rate when you are sitting still is more than 100 beats a  minute.  You have trouble breathing. This information is not intended to replace advice given to you by your health care provider. Make sure you discuss any questions you have with your health care provider. Document Released: 01/07/2009 Document Revised: 10/01/2015 Document Reviewed: 05/07/2015 Elsevier Interactive Patient Education  2018 [employer]  

## 2017-05-04 NOTE — Progress Notes (Signed)
Pt transferred to [place]. Report given to Shanon Brow, RN expressing concern for potential sepssi d/t altered mental status. Dr. Benay Spice admitted physician. Pt exhibiting dizziness, urinary incontinence, chills, and diaphoresis with elevated temperature 05/03/17. Labs and urine culture completed today. Pt left with Janett Billow, NT on 5E in room 1505.

## 2017-05-04 NOTE — Progress Notes (Addendum)
[place] OFFICE PROGRESS NOTE   Diagnosis: Pancreas cancer  INTERVAL HISTORY:   Heather Frederick returns prior to scheduled follow-up.  She is accompanied by her husband and son.  For the past 2 days they have noted increasing weakness and confusion.  She is "off balance".  Intermittent dizziness.  She has fallen twice.  She is experiencing urinary incontinence.  Yesterday she had an episode of cold chills followed by sweating.  Temperature was 101.2.  She took Tylenol with improvement.  She continues to have abdominal pain.  She is currently on MS Contin and takes Dilaudid as needed.  Objective:  Vital signs in last 24 hours:  Blood pressure (!) 94/48, pulse (!) 106, temperature 97.7 F (36.5 C), temperature source Oral, resp. rate 18, height '5\' 3"'  (1.6 m), weight 244 lb 9.6 oz (110.9 kg), SpO2 95 %.    HEENT: No thrush or ulcers.  Mucous membranes appear moist. Resp: Lungs clear bilaterally. Cardio: Regular rate and rhythm. GI: Abdomen soft.  Tender right abdomen. Vascular: No leg edema. Neuro: She is alert.  Follows commands.  Motor strength 5/5.  Unsteady gait. Port-A-Cath without erythema.   Lab Results:  Lab Results  Component Value Date   WBC 12.4 (H) 05/04/2017   HGB 8.4 (L) 04/29/2017   HCT 28.6 (L) 05/04/2017   MCV 86.3 05/04/2017   PLT 184 05/04/2017   NEUTROABS 9.3 (H) 05/04/2017    Imaging:  No results found.  Medications: I have reviewed the patient's current medications.  Assessment/Plan: 1. Metastatic pancreas cancer ? Pancreas body mass and liver metastases noted on CT abdomen/pelvis 08/11/2016 ? Ultrasound-guided biopsy of a right liver lesion 08/16/2016 revealed poorly differentiated adenocarcinoma consistent with pancreas cancer ? Foundation 1-microsatellite stable; tumor mutational burden 1; ERBB2 amplification ? Cycle 1 gemcitabine/Abraxane 09/13/2016; 09/29/2016 ? Cycle 2 gemcitabine/Abraxane 10/11/2016, 10/18/2016 ? Cycle 3  gemcitabine/Abraxane 11/01/2016, 11/08/2016 ? Cycle 4 gemcitabine/Abraxane 11/23/2016,11/29/2016 ? CT chest 12/06/2016-liver lesions appear smaller ? Cycle 5 gemcitabine/Abraxane 12/13/2016 ? CT abdomen/pelvis 01/01/2017-new and enlarging hepatic masses. Enlarging pancreatic mass. ? Cycle 6 gemcitabine/Abraxane 01/03/2017 ? Rising CA 19-9, increased pain October 2018 ? Cycle 1 FOLFOX 01/31/2017 ? Cycle 2 FOLFOX 02/19/2017 ? Cycle 3FOLFIRINOX 03/06/2017 ? CT 03/11/2017 (compared to 01/01/2017) increased liver lesions and enlargement of the pancreas mass ? Cycle4FOLFIRINOX 03/21/2017 (oxaliplatin dose reduced and Neulasta added) ? Cycle 5 FOLFIRINOX 04/09/2017 ? CT 04/13/2017- decrease in the size of the pancreas mass, no change in multiple liver lesions  2. Pain secondary to pancreas cancer, MS Contin added 03/13/2017  3. Hypertension  4. Sleep apnea  5. Chronic low back pain  6. Recurrent urinary tract infections  7. Depression  8. Migraine headaches  9. Port-A-Cath placement 08/28/2016  10. Rash following cycle 1 gemcitabine/Abraxane-drug rash?  11. Nausea/vomiting following cycle 1 gemcitabine/Abraxane-antiemetic regimen adjusted with addition of Aloxi  12. Diabetes  13. Right cephalic vein thrombosis 65/68/1275-[id number] with Xarelto  14. CT chest 12/06/2016 done to evaluate dyspnea-several 3-4 mm nodular opacities in the lung parenchyma etiology uncertain. Known mass in the body of the pancreas measuring 3.3 x 2.4 cm;small enhancing lesion in the anterior dome of the liver measures 8 mm.  15. Right forearm rash following cycle 5 day 8 gemcitabine/Abraxane. Resolved  16.Klebsiella urinary tract infection 03/14/2017  17.Admission 04/13/2017 with diaphoresis and tachycardia-resolved, 1 blood culture positive for a coagulase-negative Staphylococcus  18. Admission 04/19/2017 with recurrent hypotension  19.  Admission 04/24/2017 with  recurrent hypotension and diaphoresis  20.  Admission  04/28/2017 with diaphoresis, fever, and weakness     Disposition: Heather Frederick appears unchanged.  The etiology of her symptoms including diaphoresis, fever, weakness and now confusion remains unclear.  She will continue MS Contin but discontinue Dilaudid.  She was given a prescription for oxycodone 5 mg 1-2 tablets every 6 hours as needed.  We will check a urinalysis and culture.  Blood culture obtained from the Port-A-Cath.  Also check ammonia level.  She will receive IV fluids in the office today.  She has an office visit scheduled 05/07/2017.  We will reevaluate symptoms at that time.  We had a preliminary discussion with her husband regarding a supportive care approach with a hospice referral if her condition does not improve.  Patient seen with Dr. Benay Spice.  40 minutes were spent face-to-face at today's visit with the majority of that time involved in counseling/coordination of care.    Ned Card ANP/GNP-BC   05/04/2017  1:45 PM  Addendum 4:30 Heather Frederick is hypotensive, tachycardic and confused.  We are concerned for a possible infection.  We will contact the hospitalist for admission/further evaluation.  This was a shared visit with Ned Card.  Heather Frederick was interviewed and examined.  She presents today with recurrent confusion, hypotension, and tachycardia.  She had a fever yesterday.  The etiology of her symptoms remains unclear.  She is nonambulatory.  We evaluated her a second time in the chemotherapy room this afternoon.  She remains confused.  I am suspicious of a systemic infection, though we have not identified a clear source for infection.  A single blood culture from the Port-A-Cath 04/13/2017 was positive for a coagulase-negative Staphylococcus.  Multiple repeat cultures have been negative.  It is possible the Port-A-Cath is infected.  She has white cells in the urine today.  We had a long discussion with her husband.  She  is not a candidate for further chemotherapy in her current condition.  He cannot care for her in the home secondary to confusion and fall risk.  I contacted the hospitalist service for repeat admission.  She may require hospice and placement.  We obtained a blood culture from the Port-A-Cath and a urine culture today.  I recommend considering an ID consult and removal of the Port-A-Cath.  Please call oncology as needed over the weekend.  I will see her 05/07/2017.  I appreciate the care from the medicine service.  Julieanne Manson, MD

## 2017-05-04 NOTE — H&P (Signed)
[facility] H&P   Patient Demographics:    Heather Frederick, is a 62 y.o. female  MRN: [id number]   DOB - 10/17/1955  Admit Date - 05/04/2017  Outpatient Primary MD for the patient is [name], MD  Referring MD/NP/PA:   Betsy Coder  Outpatient Specialists:  Betsy Coder  Patient coming from: home=> cancer center  No chief complaint on file. confusion    HPI:    Heather Frederick  is a 62 y.o. 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   Pt still has chronic abdominal pain, and is currently on Ms Contin and dilaudid which was recently discontinued.  Pt sent to [facility] for direct admission for concern for ? Sepsis.   Labs show evidence of UTI which could be a unifying diagnosis.        Review of systems:    In addition to the HPI above, No Fever-chills, No Headache, No changes with Vision or hearing, No problems swallowing food or Liquids, No Chest pain, Cough or Shortness of Breath, No Abdominal pain, No Nausea or Vommitting, Bowel movements are regular, No Blood in stool or Urine, No dysuria, No new skin rashes or bruises, No new joints pains-aches,  No new weakness, tingling, numbness in any extremity, No recent weight gain or loss, No polyuria, polydypsia or polyphagia, No significant Mental Stressors.  A full 10 point Review of Systems was done, except as stated above, all other Review of Systems were negative.   With Past History of the following :    Past Medical History:  Diagnosis Date  . Allergy   . Anxiety   . Arthritis   . Benign essential HTN 09/23/2014  . Cancer ([place])   . Chronic kidney disease    uti  . Depression   . Dyslipidemia   . Elevated liver enzymes     . Family history of anesthesia complication    father has a severe hard time waking up  . Gallstones    a. Seen on CT 01/2014.  Marland Kitchen GERD (gastroesophageal reflux disease)   . Hard of hearing   . Hepatic steatosis   . History of frequent urinary tract infections   . Hyperlipidemia   . Hypertension   . Lateral epicondylitis of right elbow   . Mental disorder   . Meralgia paresthetica of right side 10/02/2012   slight at 05/2014  . Migraine headache   . Obesity   . OSA (obstructive sleep apnea)    severe with AHI 37/hr now on CPAP at 18cm H2O  . Osteoarthritis   . Pancreatic cancer metastasized to liver ([place])   . Pneumonia    Feb 2018  . Raynaud disease    in feet per patient   .  Sepsis ([place]) 02/23/2017  . Sleep apnea    wears c-pap  . Urinary tract infection       Past Surgical History:  Procedure Laterality Date  . ABDOMINAL HYSTERECTOMY  1/04   partial  . BLADDER SUSPENSION  6/10  . CARDIAC CATHETERIZATION    . carpel tunnel left/right  7/08, 8/08 Bilateral 8/08 and 7/08  . carpel tunnel rel Right 4/12  . CHOLECYSTECTOMY N/A 06/11/2014   Procedure: LAPAROSCOPIC CHOLECYSTECTOMY WITH ATTEMPTED INTRAOPERATIVE CHOLANGIOGRAM;  Surgeon: Jackolyn Confer, MD;  Location: [facility];  Service: General;  Laterality: N/A;  . COLONOSCOPY    . ERCP N/A 10/19/2014   Procedure: ENDOSCOPIC RETROGRADE CHOLANGIOPANCREATOGRAPHY (ERCP);  Surgeon: Ladene Artist, MD;  Location: [facility];  Service: Endoscopy;  Laterality: N/A;  . INCISIONAL HERNIA REPAIR N/A 06/30/2016   Procedure: LAPAROSCOPIC REPAIR OF INCISIONAL HERNIA WITH MESH;  Surgeon: Jackolyn Confer, MD;  Location: [facility];  Service: General;  Laterality: N/A;  . INSERTION OF MESH N/A 06/30/2016   Procedure: INSERTION OF MESH;  Surgeon: Jackolyn Confer, MD;  Location: [facility];  Service: General;  Laterality: N/A;  . IR CV LINE INJECTION  11/24/2016  . IR FLUORO GUIDE PORT INSERTION RIGHT  08/28/2016  . IR US GUIDE VASC ACCESS RIGHT  08/28/2016   . JOINT REPLACEMENT    . KNEE ARTHROSCOPY Left 12/12  . KNEE ARTHROSCOPY Right 12/06  . LEFT HEART CATHETERIZATION WITH CORONARY ANGIOGRAM N/A 03/10/2014   Procedure: LEFT HEART CATHETERIZATION WITH CORONARY ANGIOGRAM;  Surgeon: Sinclair Grooms, MD;  Location: [facility] CATH LAB;  Service: Cardiovascular;  Laterality: N/A;  . PLANTAR FASCIA RELEASE Right 12/10  . radial tunnel release     right arm   . ROTATOR CUFF REPAIR Left 6/11  . tennis elbow release Right 7/04  . TOTAL KNEE ARTHROPLASTY Left 09/10/2012   Procedure: TOTAL KNEE ARTHROPLASTY- left;  Surgeon: Garald Balding, MD;  Location: [place];  Service: Orthopedics;  Laterality: Left;  Left total knee arthroplasty      Social History:     Social History   Tobacco Use  . Smoking status: Never Smoker  . Smokeless tobacco: Never Used  . Tobacco comment: Secondhand - from family growing up, workplace intermittently  Substance Use Topics  . Alcohol use: Yes    Alcohol/week: 0.0 oz    Comment: occasionally - intermittent, no more than twice a week     Lives - at home  Mobility - walks by self   Family History :     Family History  Problem Relation Age of Onset  . Hypertension Mother   . Stroke Mother   . Liver disease Mother        Abcess  . Hypertension Father   . Coronary artery disease Father   . Migraines Father   . Clotting disorder Father   . Kidney failure Brother   . Hypertension Brother   . Migraines Brother   . Migraines Daughter   . Breast cancer Other        Niece with breast cancer  . Colon cancer Paternal Grandmother   . Pancreatic cancer Paternal Grandmother   . Stomach cancer Paternal Grandmother   . Breast cancer Cousin   . Esophageal cancer Neg Hx   . Rectal cancer Neg Hx       Home Medications:   Prior to Admission medications   Medication Sig Start Date End Date Taking? Authorizing Provider  acetaminophen (TYLENOL) 325 MG tablet Take 2  tablets (650 mg total) by mouth 2 (two) times  daily at 10 AM and 5 PM. 05/01/17  Yes Georgette Shell, MD  aspirin EC 81 MG tablet Take 81 mg by mouth daily.   Yes [provider]  bisacodyl (DULCOLAX) 5 MG EC tablet Take 2 tablets (10 mg total) by mouth 2 (two) times daily. 05/01/17  Yes Georgette Shell, MD  citalopram (CELEXA) 20 MG tablet TAKE ONE (1) TABLET BY MOUTH EVERY DAY Patient taking differently: Take 20mg  by mouth daily 01/22/17  Yes Hoyt Koch, MD  Cranberry-Vitamin C-Vitamin E (CRANBERRY PLUS VITAMIN C PO) Take 1 tablet by mouth daily. 4200 mg   Yes [provider]  cyclobenzaprine (FLEXERIL) 5 MG tablet TAKE ONE (1) TABLET BY MOUTH 3 TIMES DAILY AS NEEDED FOR MUSCLE SPASMS Patient taking differently: Take 5mg  by mouth as needed for muscle spams 04/19/17  Yes Ladell Pier, MD  eletriptan (RELPAX) 40 MG tablet Take 1 tablet (40 mg total) by mouth every 2 (two) hours as needed for migraine. 06/26/16  Yes Dennie Bible, NP  HYDROmorphone (DILAUDID) 4 MG tablet Take 0.5 tablets (2 mg total) by mouth every 4 (four) hours as needed for severe pain. 04/27/17  Yes Ladell Pier, MD  insulin detemir (LEVEMIR) 100 unit/ml SOLN Inject 10 Units into the skin at bedtime.   Yes [provider]  insulin lispro (HUMALOG) 100 UNIT/ML injection Inject 2-10 Units into the skin. Sliding Scale   Yes [provider]  lactulose (CHRONULAC) 10 GM/15ML solution Take 30 mLs (20 g total) by mouth daily. 05/01/17  Yes Georgette Shell, MD  lidocaine-prilocaine (EMLA) cream APPLY 1 APPLICATION TOPICALLY AS NEEDED.APPLY TO PORT A CATH SITE 1 HOUR PRIOR TO NEEDLE STICK. 03/26/17  Yes Ladell Pier, MD  LORazepam (ATIVAN) 1 MG tablet Take 1 tablet (1 mg total) by mouth every 8 (eight) hours as needed for anxiety or sedation (or nausea). 04/27/17  Yes Ladell Pier, MD  LYRICA 200 MG capsule Take 200 mg by mouth daily. 04/03/17  Yes [provider]  midodrine (PROAMATINE) 5 MG tablet Take 1  tablet (5 mg total) by mouth 3 (three) times daily with meals. 04/27/17  Yes Ladell Pier, MD  morphine (MS CONTIN) 30 MG 12 hr tablet Take 1 tablet (30 mg total) by mouth every 12 (twelve) hours. 04/27/17  Yes Ladell Pier, MD  multivitamin-lutein [facility]) CAPS capsule Take 1 capsule by mouth daily.   Yes [provider]  nitroGLYCERIN (NITROSTAT) 0.4 MG SL tablet Place 1 tablet (0.4 mg total) under the tongue every 5 (five) minutes as needed for chest pain. 06/28/15  Yes Belva Crome, MD  ondansetron (ZOFRAN) 4 MG tablet Take 1 tablet (4 mg total) by mouth every 6 (six) hours as needed for nausea. 05/01/17  Yes Georgette Shell, MD  oxybutynin (DITROPAN-XL) 10 MG 24 hr tablet Take 1 tablet (10 mg total) by mouth every morning. 03/19/17  Yes Ladell Pier, MD  pantoprazole (PROTONIX) 40 MG tablet TAKE ONE (1) TABLET BY MOUTH TWO (2) TIMES DAILY Patient taking differently: Take 40mg  by mouth twice daily 09/25/16  Yes Ladene Artist, MD  polyethylene glycol (MIRALAX / GLYCOLAX) packet Take 17 g by mouth 2 (two) times daily. 05/01/17  Yes Georgette Shell, MD  prochlorperazine (COMPAZINE) 10 MG tablet Take 10 mg by mouth every 6 (six) hours as needed for nausea or vomiting.   Yes [provider]  QC MAGNESIUM CITRATE 1.745 GM/30ML SOLN Take 296 mLs by mouth once. 04/27/17  Yes [provider]  simethicone (MYLICON) 564 MG chewable tablet Chew 125-250 mg by mouth 2 (two) times daily as needed for flatulence.   Yes [provider]  sorbitol 70 % solution Take 30 mLs by mouth daily as needed (for constipation). 04/26/17  Yes Donne Hazel, MD  vitamin B-12 (CYANOCOBALAMIN) 1000 MCG tablet Take 2,500 mcg by mouth every morning.   Yes [provider]  XARELTO 20 MG TABS tablet Take 20 mg daily by mouth. 01/31/17  Yes [provider]  oxyCODONE (OXY IR/ROXICODONE) 5 MG immediate release tablet Take 1-2 tablets (5-10 mg total) by mouth  every 6 (six) hours as needed for severe pain. Patient not taking: Reported on 05/04/2017 05/04/17   Owens Shark, NP     Allergies:     Allergies  Allergen Reactions  . Topamax [Topiramate] Other (See Comments)    Stroke like symptoms  . Aleve [Naproxen Sodium] Hives    Has tolerated Voltaren topical as well as aspirin.  . Bee Venom Swelling  . Echinacea Hives  . Other Other (See Comments)    Feathers cause sinus congestion  . Sulfa Antibiotics Hives  . Advil [Ibuprofen] Hives    Has tolerated Voltaren topical as well as aspirin.     Physical Exam:   Vitals  Blood pressure (!) 113/50, pulse (!) 123, temperature 98.5 F (36.9 C), resp. rate 17, height 5\' 3"  (1.6 m), weight 113 kg (249 lb 1.9 oz), SpO2 99 %.   1. General  lying in bed in NAD,   2. Normal affect and insight, Not Suicidal or Homicidal, Awake Alert, Oriented X 3.  3. No F.N deficits, ALL C.Nerves Intact, Strength 5/5 all 4 extremities, Sensation intact all 4 extremities, Plantars down going.  4. Ears and Eyes appear Normal, Conjunctivae clear, PERRLA. Moist Oral Mucosa.  5. Supple Neck, No JVD, No cervical lymphadenopathy appriciated, No Carotid Bruits.  6. Symmetrical Chest wall movement, Good air movement bilaterally, CTAB.  7. RRR, No Gallops, Rubs or Murmurs, No Parasternal Heave.  8. Positive Bowel Sounds, Abdomen Soft, No tenderness, No organomegaly appriciated,No rebound -guarding or rigidity.  9.  No Cyanosis, Normal Skin Turgor, No Skin Rash or Bruise.  10. Good muscle tone,  joints appear normal , no effusions, Normal ROM.  11. No Palpable Lymph Nodes in Neck or Axillae  Porta cath in the right upper chest,  No erythema,  No CVA tenderness    Data Review:    CBC Recent Labs  Lab 04/28/17 1327 04/29/17 0300 05/04/17 1140  WBC 12.3* 10.7* 12.4*  HGB 8.8* 8.4*  --   HCT 27.0* 26.1* 28.6*  PLT 175 154 184  MCV 84.6 86.7 86.3  MCH 27.6 27.9 27.0  MCHC 32.6 32.2 31.3*  RDW 19.2*  19.8* 20.5*  LYMPHSABS  --   --  0.9  MONOABS  --   --  1.7*  EOSABS  --   --  0.4  BASOSABS  --   --  0.1   ------------------------------------------------------------------------------------------------------------------  Chemistries  Recent Labs  Lab 04/28/17 1327 04/29/17 0300 05/04/17 1140  NA 132* 136 134*  K 3.6 3.9 4.2  CL 100* 102 96*  CO2 23 27 30*  GLUCOSE 100* 91 102  BUN 6 5* 5*  CREATININE 0.53 0.52 0.61  CALCIUM 8.8* 9.2 10.4  AST  --   --  41*  ALT  --   --  24  ALKPHOS  --   --  255*  BILITOT  --   --  0.9   ------------------------------------------------------------------------------------------------------------------ estimated creatinine clearance is 91.6 mL/min (by C-G formula based on SCr of 0.61 mg/dL). ------------------------------------------------------------------------------------------------------------------ No results for input(s): TSH, T4TOTAL, T3FREE, THYROIDAB in the last 72 hours.  Invalid input(s): FREET3  Coagulation profile No results for input(s): INR, PROTIME in the last 168 hours. ------------------------------------------------------------------------------------------------------------------- No results for input(s): DDIMER in the last 72 hours. -------------------------------------------------------------------------------------------------------------------  Cardiac Enzymes No results for input(s): CKMB, TROPONINI, MYOGLOBIN in the last 168 hours.  Invalid input(s): CK ------------------------------------------------------------------------------------------------------------------ No results found for: BNP   ---------------------------------------------------------------------------------------------------------------  Urinalysis    Component Value Date/Time   COLORURINE AMBER (A) 05/04/2017 [place] 05/04/2017 1216   LABSPEC 1.017 05/04/2017 1216   LABSPEC 1.005 03/28/2017 1319   PHURINE 6.0  05/04/2017 1216   GLUCOSEU NEGATIVE 05/04/2017 1216   GLUCOSEU Negative 03/28/2017 1319   HGBUR NEGATIVE 05/04/2017 1216   BILIRUBINUR NEGATIVE 05/04/2017 1216   BILIRUBINUR Negative 03/28/2017 1319   KETONESUR NEGATIVE 05/04/2017 1216   PROTEINUR 30 (A) 05/04/2017 1216   UROBILINOGEN 0.2 03/28/2017 1319   NITRITE NEGATIVE 05/04/2017 1216   LEUKOCYTESUR TRACE (A) 05/04/2017 1216   LEUKOCYTESUR Trace 03/28/2017 1319    ----------------------------------------------------------------------------------------------------------------   Imaging Results:    No results found.   Assessment & Plan:    Principal Problem:   UTI (urinary tract infection) Active Problems:   Elevated LFTs   Fever   Anemia   Altered mental status   Confusion secondary to UTI Observe, if not improving then further imaging of brain would be prudent.  Trying to minimize pain medication Oncology just stopped her dilaudid  UTI Blood culture pending Urine culture pending Rocephin 1gm iv qday  Fever secondary to UTI Tylenol prn  Abnormal liver function, Pancreatic cancer metastatic to liver (normal ammonia) Check cmp in am  R cephalic vein thrombosis 0/81/4481  Xarelto  ???  OSA on cpap  DVT Prophylaxis Xarelto - SCDs   AM Labs Ordered, also please review Full Orders  Family Communication: Admission, patients condition and plan of care including tests being ordered have been discussed with the patient  who indicate understanding and agree with the plan and Code Status.  Code Status FULL CODE  Likely DC to  home  Condition GUARDED    Consults called: none  Admission status: inpatient   Time spent in minutes : 45   [name] M.D on 05/04/2017 at 11:37 PM  Between 7am to 7pm - Pager - [phone]  . After 7pm go to [email or web address] - password [facility]  [facility] - Office  [phone]

## 2017-05-04 NOTE — Telephone Encounter (Signed)
Message from husband reporting pt developed profuse sweating, fever 101.2 and weakness on 05/03/17. He reports temp today is 98.4 but she is confused and balance is off. She is unable to stand or ambulate without assistance. Husband reports she is falling backward. Also report bladder incontinence when transferring from bed to [facility] with assistance.  Reviewed with Dr. Benay Spice: Order received for lab/office visit today. Called husband with instructions to come to office now. He voiced understanding.

## 2017-05-05 DIAGNOSIS — R400 Somnolence: Secondary | ICD-10-CM

## 2017-05-05 DIAGNOSIS — N3000 Acute cystitis without hematuria: Secondary | ICD-10-CM

## 2017-05-05 LAB — CBC
HEMATOCRIT: 27.8 % — AB (ref 36.0–46.0)
Hemoglobin: 8.6 g/dL — ABNORMAL LOW (ref 12.0–15.0)
MCH: 27.5 pg (ref 26.0–34.0)
MCHC: 30.9 g/dL (ref 30.0–36.0)
MCV: 88.8 fL (ref 78.0–100.0)
Platelets: 167 10*3/uL (ref 150–400)
RBC: 3.13 MIL/uL — AB (ref 3.87–5.11)
RDW: 19.6 % — AB (ref 11.5–15.5)
WBC: 11.5 10*3/uL — AB (ref 4.0–10.5)

## 2017-05-05 LAB — COMPREHENSIVE METABOLIC PANEL
ALT: 26 U/L (ref 14–54)
ANION GAP: 6 (ref 5–15)
AST: 44 U/L — AB (ref 15–41)
Albumin: 2.4 g/dL — ABNORMAL LOW (ref 3.5–5.0)
Alkaline Phosphatase: 225 U/L — ABNORMAL HIGH (ref 38–126)
BILIRUBIN TOTAL: 0.8 mg/dL (ref 0.3–1.2)
BUN: 5 mg/dL — AB (ref 6–20)
CHLORIDE: 100 mmol/L — AB (ref 101–111)
CO2: 31 mmol/L (ref 22–32)
Calcium: 9.6 mg/dL (ref 8.9–10.3)
Creatinine, Ser: 0.47 mg/dL (ref 0.44–1.00)
Glucose, Bld: 94 mg/dL (ref 65–99)
POTASSIUM: 4.1 mmol/L (ref 3.5–5.1)
Sodium: 137 mmol/L (ref 135–145)
TOTAL PROTEIN: 6 g/dL — AB (ref 6.5–8.1)

## 2017-05-05 LAB — URINE CULTURE

## 2017-05-05 LAB — GLUCOSE, CAPILLARY: Glucose-Capillary: 107 mg/dL — ABNORMAL HIGH (ref 65–99)

## 2017-05-05 MED ORDER — OXYCODONE HCL 5 MG PO TABS
10.0000 mg | ORAL_TABLET | Freq: Four times a day (QID) | ORAL | Status: DC | PRN
  Administered 2017-05-05 – 2017-05-09 (×12): 10 mg via ORAL
  Filled 2017-05-05 (×13): qty 2

## 2017-05-05 NOTE — Progress Notes (Signed)
Patient ID: Heather Frederick, female   DOB: 1955-06-13, 62 y.o.   MRN: [id number]  PROGRESS NOTE    Heather Frederick  VOZ:[id number] DOB: 07-Nov-1955 DOA: 05/04/2017 PCP: [name], MD   Outpatient Specialists:[name]      Brief Narrative:  Heather Frederick  is a 62 y.o. 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  Pt still has chronic abdominal pain, and is currently on Ms Contin and dilaudid which was recently discontinued. She also has flank pain as well as dysuria.  UTI has been diagnosed and is currently on treatment.   Assessment & Plan:   Principal Problem:   UTI (urinary tract infection) Active Problems:   Elevated LFTs   Fever   Anemia   Altered mental status   #1 urinary tract infection: Awaiting urine culture and sensitivities.  Continue IV antibiotics and supportive care.  #2 chronic pain: Patient currently on MS Contin and   She was on Dilaudid which was discontinued secondary to confusion.  Pain is not controlled.  I will increase his oxycodone from 5 mg to 10 mg today  #3 altered mental status: Suspected due to UTI versus Dilaudid.  Currently off Dilaudid.  Also on treatment for UTI.  Continue to monitor mental status which has improved.  #4 abnormal liver function tests: Secondary to pancreatic cancer which is metastatic to the liver.  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  She is more awake alert but very  weak  Objective: Vitals:   05/04/17 2054 05/04/17 2223 05/05/17 0428 05/05/17 1352  BP: (!) 113/50  (!) 102/53 (!) 124/45  Pulse: (!) 123  74 (!) 104  Resp: 17  18 18   Temp: (!) 101.5 F (38.6 C) 98.5 F (36.9 C) 97.8 F (36.6 C) 98.7 F (37.1 C)  TempSrc: Oral  Oral Oral  SpO2: 99%  99% 97%  Weight:   112.5 kg (248 lb)   Height:       No intake or output data in the 24 hours ending 05/05/17 1804 Filed Weights   05/04/17 1821 05/05/17 0428  Weight: 113 kg (249 lb 1.9 oz) 112.5 kg (248 lb)    Examination:  General exam: Appears calm and comfortable  Respiratory system: Clear to auscultation. Respiratory effort normal. Cardiovascular system: S1 & S2 heard, RRR. No JVD, murmurs, rubs, gallops or clicks. No pedal edema. Gastrointestinal system: Abdomen is nondistended, soft and nontender. No organomegaly or masses felt. Normal bowel sounds heard. Central nervous system: Alert and oriented. No focal neurological deficits. Extremities: Symmetric 5 x 5 power. Skin: No rashes, lesions or ulcers Psychiatry: Judgement and insight appear normal. Mood & affect appropriate.     Data Reviewed: I have personally reviewed following labs and imaging studies  CBC: Recent Labs  Lab 04/29/17 0300 05/04/17 1140 05/05/17 0643  WBC 10.7* 12.4* 11.5*  NEUTROABS  --  9.3*  --   HGB 8.4*  --  8.6*  HCT 26.1* 28.6* 27.8*  MCV 86.7 86.3 88.8  PLT 154 184  409   Basic Metabolic Panel: Recent Labs  Lab 04/29/17 0300 05/04/17 1140 05/05/17 0643  NA 136 134* 137  K 3.9 4.2 4.1  CL 102 96* 100*  CO2 27 30* 31  GLUCOSE 91 102 94  BUN 5* 5* 5*  CREATININE 0.52 0.61 0.47  CALCIUM 9.2 10.4 9.6   GFR: Estimated Creatinine Clearance: 91.3 mL/min (by C-G formula based on SCr of 0.47 mg/dL). Liver Function Tests: Recent Labs  Lab 05/04/17 1140 05/05/17 0643  AST 41* 44*  ALT 24 26  ALKPHOS 255* 225*  BILITOT 0.9 0.8  PROT 6.4 6.0*  ALBUMIN 2.4* 2.4*   No results for input(s):  LIPASE, AMYLASE in the last 168 hours. Recent Labs  Lab 05/04/17 1240  AMMONIA 21   Coagulation Profile: No results for input(s): INR, PROTIME in the last 168 hours. Cardiac Enzymes: No results for input(s): CKTOTAL, CKMB, CKMBINDEX, TROPONINI in the last 168 hours. BNP (last 3 results) No results for input(s): PROBNP in the last 8760 hours. HbA1C: No results for input(s): HGBA1C in the last 72 hours. CBG: Recent Labs  Lab 04/30/17 1652 04/30/17 2103 05/01/17 0719 05/01/17 1143 05/04/17 2214  GLUCAP 189* 155* 90 113* 157*   Lipid Profile: No results for input(s): CHOL, HDL, LDLCALC, TRIG, CHOLHDL, LDLDIRECT in the last 72 hours. Thyroid Function Tests: No results for input(s): TSH, T4TOTAL, FREET4, T3FREE, THYROIDAB in the last 72 hours. Anemia Panel: No results for input(s): VITAMINB12, FOLATE, FERRITIN, TIBC, IRON, RETICCTPCT in the last 72 hours. Urine analysis:    Component Value Date/Time   COLORURINE AMBER (A) 05/04/2017 1216   APPEARANCEUR CLEAR 05/04/2017 1216   LABSPEC 1.017 05/04/2017 1216   LABSPEC 1.005 03/28/2017 1319   PHURINE 6.0 05/04/2017 1216   GLUCOSEU NEGATIVE 05/04/2017 1216   GLUCOSEU Negative 03/28/2017 1319   HGBUR NEGATIVE 05/04/2017 1216   BILIRUBINUR NEGATIVE 05/04/2017 1216   BILIRUBINUR Negative 03/28/2017 1319   KETONESUR NEGATIVE 05/04/2017 1216   PROTEINUR 30 (A) 05/04/2017 1216   UROBILINOGEN 0.2 03/28/2017 1319   NITRITE NEGATIVE 05/04/2017 1216   LEUKOCYTESUR TRACE (A) 05/04/2017 1216   LEUKOCYTESUR Trace 03/28/2017 1319   Sepsis Labs: @LABRCNTIP (procalcitonin:4,lacticidven:4)  ) Recent Results (from the past 240 hour(s))  Culture, blood (Routine X 2) w Reflex to ID Panel     Status: None   Collection Time: 04/28/17  9:13 PM  Result Value Ref Range Status   Specimen Description   Final    BLOOD RIGHT ANTECUBITAL Performed at [facility], [place] [address], [place], [place] [address]    Special Requests    Final    BOTTLES DRAWN AEROBIC AND ANAEROBIC Blood Culture adequate volume Performed at [place] [address], [place], [place] [address]    Culture   Final    NO GROWTH 5 DAYS Performed at [facility], [place] [address] [place], [place] [address]    Report Status 05/04/2017 FINAL  Final  Culture, blood (Routine X 2) w Reflex to ID Panel     Status: None   Collection Time: 04/28/17  9:31 PM  Result Value Ref Range Status   Specimen Description   Final    BLOOD RIGHT ANTECUBITAL Performed at [place] [address], [place], [place] [address]    Special Requests   Final    BOTTLES DRAWN AEROBIC AND ANAEROBIC Blood Culture adequate volume Performed at [place] [address], [place], [place] [address]  Culture   Final    NO GROWTH 5 DAYS Performed at [facility], [place] [address], [place], [place] [address]    Report Status 05/04/2017 FINAL  Final  Culture, Blood     Status: None (Preliminary result)   Collection Time: 05/04/17 11:40 AM  Result Value Ref Range Status   Specimen Description BLOOD PORTA CATH  Final   Special Requests   Final    BOTTLES DRAWN AEROBIC AND ANAEROBIC Blood Culture adequate volume   Culture   Final    NO GROWTH < 24 HOURS Performed at [facility], [place] [address] [place], [place] [address]    Report Status PENDING  Incomplete  Urine Culture     Status: Abnormal   Collection Time: 05/04/17 12:16 PM  Result Value Ref Range Status   Specimen Description   Final    URINE, CLEAN CATCH Performed at [facility], [place] [address], [place], [place] [address]    Special Requests   Final    NONE Performed at [facility], [place] [address] [place], [place] [address]    Culture MULTIPLE SPECIES PRESENT, SUGGEST RECOLLECTION (A)  Final   Report Status 05/05/2017 FINAL  Final         Radiology Studies: No  results found.      Scheduled Meds: . aspirin EC  81 mg Oral Daily  . bisacodyl  10 mg Oral BID  . citalopram  20 mg Oral Daily  . feeding supplement (ENSURE ENLIVE)  237 mL Oral BID BM  . insulin detemir  10 Units Subcutaneous QHS  . lactulose  20 g Oral Daily  . midodrine  5 mg Oral TID WC  . morphine  30 mg Oral Q12H  . multivitamin-lutein  1 capsule Oral Daily  . oxybutynin  10 mg Oral q morning - 10a  . pantoprazole  40 mg Oral BID  . polyethylene glycol  17 g Oral BID  . pregabalin  200 mg Oral Daily  . rivaroxaban  20 mg Oral Q breakfast  . vitamin B-12  2,500 mcg Oral Daily   Continuous Infusions: . sodium chloride 75 mL/hr at 05/04/17 1930  . cefTRIAXone (ROCEPHIN)  IV Stopped (05/04/17 2025)     LOS: 1 day    Time spent: 23 minutes   Jahaziel Francois,LAWAL, MD [facility] Pager [phone]  If 7PM-7AM, please contact night-coverage [email or web address] Password [facility] 05/05/2017, 6:04 PM

## 2017-05-05 NOTE — Progress Notes (Signed)
Pt has arrived to room 1502 from ED. Alert and oriented.

## 2017-05-06 ENCOUNTER — Inpatient Hospital Stay (HOSPITAL_COMMUNITY): Payer: 59

## 2017-05-06 LAB — GLUCOSE, CAPILLARY
GLUCOSE-CAPILLARY: 119 mg/dL — AB (ref 65–99)
GLUCOSE-CAPILLARY: 136 mg/dL — AB (ref 65–99)
Glucose-Capillary: 95 mg/dL (ref 65–99)
Glucose-Capillary: 140 mg/dL — ABNORMAL HIGH (ref 65–99)

## 2017-05-06 MED ORDER — VANCOMYCIN HCL IN DEXTROSE 750-5 MG/150ML-% IV SOLN
750.0000 mg | Freq: Two times a day (BID) | INTRAVENOUS | Status: DC
  Administered 2017-05-06 – 2017-05-08 (×4): 750 mg via INTRAVENOUS
  Filled 2017-05-06 (×4): qty 150

## 2017-05-06 MED ORDER — VANCOMYCIN HCL 10 G IV SOLR: 1500.0000 mg | INTRAVENOUS | Status: DC

## 2017-05-06 MED ORDER — PROSIGHT PO TABS
1.0000 | ORAL_TABLET | Freq: Every day | ORAL | Status: DC
  Administered 2017-05-06 – 2017-05-08 (×3): 1 via ORAL
  Filled 2017-05-06 (×3): qty 1

## 2017-05-06 MED ORDER — VANCOMYCIN HCL 10 G IV SOLR
2000.0000 mg | Freq: Once | INTRAVENOUS | Status: AC
  Administered 2017-05-06: 2000 mg via INTRAVENOUS
  Filled 2017-05-06: qty 2000

## 2017-05-06 MED ORDER — DEXTROSE 5 % IV SOLN
2.0000 g | Freq: Three times a day (TID) | INTRAVENOUS | Status: DC
  Administered 2017-05-06 – 2017-05-07 (×4): 2 g via INTRAVENOUS
  Filled 2017-05-06 (×4): qty 2

## 2017-05-06 NOTE — Progress Notes (Signed)
Pharmacy Antibiotic Note  Heather Frederick is a 62 y.o. female with hx metastatic cancer, presented to [facility] from the [facility] on 05/04/2017 for w/u of AMS, confusion, fever, and urinary incontinence. She was started on ceftriaxone on admission for suspected UTI.  Patient remains febrile -- to change abx to vancomycin and cefepime on 2/10 for suspected sepsis.  - Tmax 102.7, wbc 11.5, scr 0.47 (crcl~91)  Plan: - vancomycin 2000 mg IV x1, then 750 mg IV q12h for est AUC 411 (goal 400-500) - cefepime 2gm IV q8h per MD  _____________________________________  Height: 5\' 3"  (160 cm) Weight: 247 lb (112 kg) IBW/kg (Calculated) : 52.4  Temp (24hrs), Avg:100.1 F (37.8 C), Min:98.7 F (37.1 C), Max:102.7 F (39.3 C)  Recent Labs  Lab 05/04/17 1140 05/05/17 0643  WBC 12.4* 11.5*  CREATININE 0.61 0.47    Estimated Creatinine Clearance: 91.1 mL/min (by C-G formula based on SCr of 0.47 mg/dL).    Allergies  Allergen Reactions  . Topamax [Topiramate] Other (See Comments)    Stroke like symptoms  . Aleve [Naproxen Sodium] Hives    Has tolerated Voltaren topical as well as aspirin.  . Bee Venom Swelling  . Echinacea Hives  . Other Other (See Comments)    Feathers cause sinus congestion  . Sulfa Antibiotics Hives  . Advil [Ibuprofen] Hives    Has tolerated Voltaren topical as well as aspirin.    Antimicrobials this admission:  2/8 CTX>> 2/10 2/10 vanc>> 2/10 cefepime>>  Dose adjustments this admission:  --  Microbiology results:  2/8 BCx x2:  2/8 UCx:    Thank you for allowing pharmacy to be a part of this patient's care.  Lynelle Doctor 05/06/2017 8:01 AM

## 2017-05-06 NOTE — Progress Notes (Signed)
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   Assessment & Plan:   Principal Problem:   UTI (urinary tract infection) Active Problems:   Elevated LFTs   Fever   Anemia   Altered mental status   ##) Fever with unclear source: Initial thought was that urine could be likely source.  However her UA is not dramatically impressive.  She has chronic lower urinary tract symptomatology however nothing acute.  She does not have any cough or other localizing signs.  She continues to have a fever yesterday.  Unfortunately she did continues to complain of her chronic abdominal pain.  She has had multiple CTs in the past that have shown no clear source.  On review of the chart patient has had multiple admissions for vertigo, hypertension, tachycardia.  Blood cultures from these hospitalizations have been either negative or contaminants.  She is seen by both cardiology and pulmonology.  She was started on midodrine.  It was felt that most of the symptoms were due to her chemotherapeutics. -Start cefepime and vancomycin sign -Follow-up blood cultures -Chest x-ray obtained 05/06/2017 shows no acute process next line-continue midodrine 5 mg 3 times daily  ##) Psych/pain: - Continue citalopram 20 mg daily - Continue cyclobenzaprine 5 mg 3 times daily as needed -Continue MS Contin -Continue pregabalin 200 mg daily -Continue lorazepam 1 mg 3 times daily as needed  ##) Metastatic pancreatic adenocarcinoma: - Continue pain management  per above -Continue sorbitol and bisacodyl 10 mg twice daily  ##) type 2 diabetes: - Continue Levemir 10 units nightly -Sliding scale insulin  ##) cephalic vein thrombus: -Continue rivaroxaban 20 mg  ##) Bladder spasms: -Continue extended release oxybutynin 10 mg every morning  ##) Migraines: -Continue eletriptan 40 mg every 2 hours as needed  Fluids: Tolerating p.o. Electrolytes: Monitor and supplement Nutrition: Regular diet  Prophylaxis: On rivaroxaban  Disposition: Pending afebrile.  Full code    Consultants:   None  Procedures: (Don't include imaging studies which can be auto populated. Include things that cannot be auto populated i.e. Echo, Carotid and venous dopplers, Foley, Bipap, HD, tubes/drains, wound vac, central lines etc)  None  Antimicrobials: (specify start and planned stop date. Auto populated tables are space occupying and do not give end dates)  Cefepime and vancomycin started 05/06/2017   Subjective: Patient appears to feel somewhat better.  She continues to have a chronic right-sided abdominal pain.  She and her husband are quite concerned about the fact that they are missing her chemotherapy because of these repeated admissions for hypotension, and possibly sepsis.  Objective: Vitals:   05/05/17 1352 05/05/17 1902 05/05/17 2044 05/06/17 0529  BP: (!) 124/45 (!) 134/94 125/65 99/60  Pulse: (!) 104 80 (!) 107 76  Resp: 18 (!) 22 20 18   Temp: 98.7 F (37.1 C) (!) 102.7 F (39.3 C) 99.1 F (37.3 C) 99.9 F (37.7 C)  TempSrc: Oral Oral Oral Oral  SpO2: 97% 92% 91% 96%  Weight:    112 kg (247 lb)  Height:  Intake/Output Summary (Last 24 hours) at 05/06/2017 1127 Last data filed at 05/05/2017 1852 Gross per 24 hour  Intake 900 ml  Output -  Net 900 ml   Filed Weights   05/04/17 1821 05/05/17 0428 05/06/17 0529  Weight: 113 kg (249 lb 1.9 oz) 112.5 kg (248 lb) 112 kg (247 lb)    Examination:  General exam: Uncomfortable  appearing, no acute distress Respiratory system: Clear to auscultation. Respiratory effort normal. Cardiovascular system: Regular rate and rhythm, distant heart sounds Gastrointestinal system: Obese, soft, nontender, no palpation to superficial or deep tenderness, plus bowel sounds Central nervous system: Grossly intact Extremities: Trace lower extremity edema Skin: Port site is clean dry and intact Psychiatry: Judgement and insight appear normal. Mood & affect appropriate.     Data Reviewed:  CBC: Recent Labs  Lab 05/04/17 1140 05/05/17 0643  WBC 12.4* 11.5*  NEUTROABS 9.3*  --   HGB  --  8.6*  HCT 28.6* 27.8*  MCV 86.3 88.8  PLT 184 185   Basic Metabolic Panel: Recent Labs  Lab 05/04/17 1140 05/05/17 0643  NA 134* 137  K 4.2 4.1  CL 96* 100*  CO2 30* 31  GLUCOSE 102 94  BUN 5* 5*  CREATININE 0.61 0.47  CALCIUM 10.4 9.6   GFR: Estimated Creatinine Clearance: 91.1 mL/min (by C-G formula based on SCr of 0.47 mg/dL). Liver Function Tests: Recent Labs  Lab 05/04/17 1140 05/05/17 0643  AST 41* 44*  ALT 24 26  ALKPHOS 255* 225*  BILITOT 0.9 0.8  PROT 6.4 6.0*  ALBUMIN 2.4* 2.4*   No results for input(s): LIPASE, AMYLASE in the last 168 hours. Recent Labs  Lab 05/04/17 1240  AMMONIA 21   Coagulation Profile: No results for input(s): INR, PROTIME in the last 168 hours. Cardiac Enzymes: No results for input(s): CKTOTAL, CKMB, CKMBINDEX, TROPONINI in the last 168 hours. BNP (last 3 results) No results for input(s): PROBNP in the last 8760 hours. HbA1C: No results for input(s): HGBA1C in the last 72 hours. CBG: Recent Labs  Lab 05/01/17 0719 05/01/17 1143 05/04/17 2214 05/05/17 2359 05/06/17 0730  GLUCAP 90 113* 157* 107* 95   Lipid Profile: No results for input(s): CHOL, HDL, LDLCALC, TRIG, CHOLHDL, LDLDIRECT in the last 72 hours. Thyroid Function Tests: No results for input(s): TSH, T4TOTAL, FREET4, T3FREE, THYROIDAB in the last 72  hours. Anemia Panel: No results for input(s): VITAMINB12, FOLATE, FERRITIN, TIBC, IRON, RETICCTPCT in the last 72 hours. Sepsis Labs: No results for input(s): PROCALCITON, LATICACIDVEN in the last 168 hours.  Recent Results (from the past 240 hour(s))  Culture, blood (Routine X 2) w Reflex to ID Panel     Status: None   Collection Time: 04/28/17  9:13 PM  Result Value Ref Range Status   Specimen Description   Final    BLOOD RIGHT ANTECUBITAL Performed at [place] [address], [place], [place] [address]    Special Requests   Final    BOTTLES DRAWN AEROBIC AND ANAEROBIC Blood Culture adequate volume Performed at [place] [address], [place], [place] [address]    Culture   Final    NO GROWTH 5 DAYS Performed at [facility], [place] [address], [place], [place] [address]    Report Status 05/04/2017 FINAL  Final  Culture, blood (Routine X 2) w Reflex to ID Panel     Status: None   Collection Time: 04/28/17  9:31 PM  Result Value Ref  Range Status   Specimen Description   Final    BLOOD RIGHT ANTECUBITAL Performed at [place] [address], [place], [place] [address]    Special Requests   Final    BOTTLES DRAWN AEROBIC AND ANAEROBIC Blood Culture adequate volume Performed at [place] [address] [place], [place] [address]    Culture   Final    NO GROWTH 5 DAYS Performed at [facility], [place] [address], [place], [place] [address]    Report Status 05/04/2017 FINAL  Final  Culture, Blood     Status: None (Preliminary result)   Collection Time: 05/04/17 11:40 AM  Result Value Ref Range Status   Specimen Description BLOOD PORTA CATH  Final   Special Requests   Final    BOTTLES DRAWN AEROBIC AND ANAEROBIC Blood Culture adequate volume   Culture   Final    NO GROWTH 2 DAYS Performed at [facility], [place] [address], [place], [place] [address]    Report Status  PENDING  Incomplete  Urine Culture     Status: Abnormal   Collection Time: 05/04/17 12:16 PM  Result Value Ref Range Status   Specimen Description   Final    URINE, CLEAN CATCH Performed at [facility], [place] [address], [place], [place] [address]    Special Requests   Final    NONE Performed at [facility], [place] [address] [place], [place] [address]    Culture MULTIPLE SPECIES PRESENT, SUGGEST RECOLLECTION (A)  Final   Report Status 05/05/2017 FINAL  Final         Radiology Studies: Dg Chest Port 1v Same Day  Result Date: 05/06/2017 CLINICAL DATA:  Weakness and shortness of breath. EXAM: PORTABLE CHEST 1 VIEW COMPARISON:  04/28/2017 CT, 04/24/2017 radiographs and prior studies. FINDINGS: Cardiomegaly and right Port-A-Cath with tip overlying the mid SVC again noted. Right hemithorax volume loss and mild right basilar atelectasis/scarring again noted. There is no evidence of airspace disease, pleural effusion or pneumothorax. No acute bony abnormalities are present. IMPRESSION: Unchanged appearance of the chest with mild right basilar atelectasis/scarring. Electronically Signed   By: Margarette Canada M.D.   On: 05/06/2017 07:54        Scheduled Meds: . aspirin EC  81 mg Oral Daily  . bisacodyl  10 mg Oral BID  . citalopram  20 mg Oral Daily  . feeding supplement (ENSURE ENLIVE)  237 mL Oral BID BM  . insulin detemir  10 Units Subcutaneous QHS  . lactulose  20 g Oral Daily  . midodrine  5 mg Oral TID WC  . morphine  30 mg Oral Q12H  . multivitamin  1 tablet Oral Daily  . oxybutynin  10 mg Oral q morning - 10a  . pantoprazole  40 mg Oral BID  . polyethylene glycol  17 g Oral BID  . pregabalin  200 mg Oral Daily  . rivaroxaban  20 mg Oral Q breakfast  . vitamin B-12  2,500 mcg Oral Daily   Continuous Infusions: . ceFEPime (MAXIPIME) IV Stopped (05/06/17 0908)  . vancomycin       LOS: 2 days    Time spent: [place], MD [facility]  If 7PM-7AM, please contact night-coverage [email or web address] Password [facility] 05/06/2017, 11:27 AM

## 2017-05-07 ENCOUNTER — Other Ambulatory Visit: Payer: 59

## 2017-05-07 ENCOUNTER — Ambulatory Visit: Payer: 59 | Admitting: Oncology

## 2017-05-07 ENCOUNTER — Ambulatory Visit: Payer: 59

## 2017-05-07 DIAGNOSIS — N390 Urinary tract infection, site not specified: Secondary | ICD-10-CM

## 2017-05-07 DIAGNOSIS — E118 Type 2 diabetes mellitus with unspecified complications: Secondary | ICD-10-CM

## 2017-05-07 DIAGNOSIS — G8929 Other chronic pain: Secondary | ICD-10-CM

## 2017-05-07 DIAGNOSIS — K219 Gastro-esophageal reflux disease without esophagitis: Secondary | ICD-10-CM

## 2017-05-07 DIAGNOSIS — G43809 Other migraine, not intractable, without status migrainosus: Secondary | ICD-10-CM

## 2017-05-07 DIAGNOSIS — C772 Secondary and unspecified malignant neoplasm of intra-abdominal lymph nodes: Secondary | ICD-10-CM

## 2017-05-07 DIAGNOSIS — C251 Malignant neoplasm of body of pancreas: Principal | ICD-10-CM

## 2017-05-07 LAB — CBC
HCT: 28.1 % — ABNORMAL LOW (ref 36.0–46.0)
Hemoglobin: 8.9 g/dL — ABNORMAL LOW (ref 12.0–15.0)
MCH: 28 pg (ref 26.0–34.0)
MCHC: 31.7 g/dL (ref 30.0–36.0)
MCV: 88.4 fL (ref 78.0–100.0)
Platelets: 178 10*3/uL (ref 150–400)
RBC: 3.18 MIL/uL — ABNORMAL LOW (ref 3.87–5.11)
RDW: 19 % — ABNORMAL HIGH (ref 11.5–15.5)
WBC: 16.3 10*3/uL — ABNORMAL HIGH (ref 4.0–10.5)

## 2017-05-07 LAB — COMPREHENSIVE METABOLIC PANEL
ALT: 25 U/L (ref 14–54)
AST: 41 U/L (ref 15–41)
Albumin: 2.4 g/dL — ABNORMAL LOW (ref 3.5–5.0)
Alkaline Phosphatase: 250 U/L — ABNORMAL HIGH (ref 38–126)
Anion gap: 12 (ref 5–15)
CO2: 24 mmol/L (ref 22–32)
Calcium: 9.9 mg/dL (ref 8.9–10.3)
Glucose, Bld: 135 mg/dL — ABNORMAL HIGH (ref 65–99)
Sodium: 132 mmol/L — ABNORMAL LOW (ref 135–145)

## 2017-05-07 LAB — COMPREHENSIVE METABOLIC PANEL WITH GFR
BUN: 5 mg/dL — ABNORMAL LOW (ref 6–20)
Chloride: 96 mmol/L — ABNORMAL LOW (ref 101–111)
Creatinine, Ser: 0.59 mg/dL (ref 0.44–1.00)
GFR calc Af Amer: >60 mL/min (ref >60)
GFR calc non Af Amer: >60 mL/min (ref >60)
Potassium: 3.6 mmol/L (ref 3.5–5.1)
Total Bilirubin: 1.2 mg/dL (ref 0.3–1.2)
Total Protein: 6.2 g/dL — ABNORMAL LOW (ref 6.5–8.1)

## 2017-05-07 LAB — GLUCOSE, CAPILLARY
GLUCOSE-CAPILLARY: 132 mg/dL — AB (ref 65–99)
Glucose-Capillary: 125 mg/dL — ABNORMAL HIGH (ref 65–99)
Glucose-Capillary: 181 mg/dL — ABNORMAL HIGH (ref 65–99)
Glucose-Capillary: 170 mg/dL — ABNORMAL HIGH (ref 65–99)

## 2017-05-07 LAB — MAGNESIUM: Magnesium: 1.5 mg/dL — ABNORMAL LOW (ref 1.7–2.4)

## 2017-05-07 MED ORDER — MAGNESIUM SULFATE 4 GM/100ML IV SOLN
4.0000 g | Freq: Once | INTRAVENOUS | Status: AC
  Administered 2017-05-07: 4 g via INTRAVENOUS
  Filled 2017-05-07: qty 100

## 2017-05-07 MED ORDER — SODIUM CHLORIDE 0.9 % IV SOLN
2.0000 g | Freq: Three times a day (TID) | INTRAVENOUS | Status: DC
  Administered 2017-05-07 – 2017-05-08 (×3): 2 g via INTRAVENOUS
  Filled 2017-05-07 (×4): qty 2

## 2017-05-07 MED ORDER — SODIUM CHLORIDE 0.9 % IV SOLN
INTRAVENOUS | Status: DC
  Administered 2017-05-08: 06:00:00 via INTRAVENOUS

## 2017-05-07 MED ORDER — RIVAROXABAN 20 MG PO TABS
20.0000 mg | ORAL_TABLET | Freq: Every day | ORAL | Status: DC
  Administered 2017-05-09: 20 mg via ORAL
  Filled 2017-05-07: qty 1

## 2017-05-07 NOTE — Progress Notes (Signed)
PROGRESS NOTE    Heather Frederick  GNO:[id number] DOB: 10/22/1955 DOA: 05/04/2017 PCP: [name], MD    Brief Narrative:  Heather Frederick is a 62 year old woman with a past medical history relevant for type 2 diabetes, chronic pain syndrome, stage IV pancreatic cancer on modified FOLFIRINOX last cycle 5 on 04/09/2017, migraines, depression, right cephalic vein thrombus on rivaroxaban, sleep apnea who was admitted from oncology clinic with confusion, vertigo, chills and sweats and found to be febrile to 101.2 with unclear source.     Assessment & Plan:   Principal Problem:   Fever Active Problems:   Altered mental status   GERD (gastroesophageal reflux disease)   Elevated LFTs   Depression   Pancreatic carcinoma metastatic to liver (HCC)   Anemia   Thrombocytopenia (HCC)   Chronic pain   Anxiety and depression   UTI (urinary tract infection)   Type 2 diabetes mellitus with complication (HCC)  #1 fever Questionable etiology. Initial concern was for UTI however urinalysis with trace leukocytes, nitrite negative, 6-30 WBCs. Urine cultures done with multiple species present. Chest x-ray done negative for any acute infiltrate. Blood cultures from 05/04/2017 pending with no growth to date. Patient diaphoretic. Patient noted to have some fevers over the weekend. Concerned that this is likely secondary to tumor burden. Patient was started empirically on IV vancomycin and cefepime which will continue for now. Patient was seen by oncology were recommending removal of Port-A-Cath. Oncology also return back to spoke with patient and family and now recommending home with hospice versus residential hospice home. Per oncology.  #2 metastatic pancreatic adenocarcinoma Continue current pain regimen with MS Contin, Flexeril as needed, oxycodone for breakthrough pain and Dilaudid for severe pain. Per oncology.  #3 depression/anxiety Continue citalopram and Ativan as needed.  #4 bladder spasms Continue  oxybutynin.  #5 migraines Currently stable. Continue eletriptan 40 mg every 2 hours when necessary.  #6 cephalic vein thrombus Currently stable. Continue anticoagulation with Xarelto.  #7 diabetes mellitus type 2 Hemoglobin A1c was 7.4 09/29/2016. CBGs have ranged from 125-132. Continue current regimen of Levemir. Place on sliding scale insulin.  #8 GERD PPI.    DVT prophylaxis: xarelto Code Status: Full Family Communication: Updated patient and husband at bedside. Disposition Plan: To be determined.   Consultants:   Oncology: Dr Benay Spice 05/07/2017  Procedures:   Chest x-ray 05/06/2017  Antimicrobials:   IV Cefepime 05/06/2017  IV Rocephin 05/04/2017>>>>05/06/2017  IV Vancomycin 05/06/2017   Subjective: SleeSleeping is arousable. Stomach 3.ping easily arousable.  Sweating.  Denies any shortness of breath or chest pain.  Husband at bedside.  Objective: Vitals:   05/06/17 1429 05/06/17 2025 05/07/17 0534 05/07/17 1512  BP: 119/62 120/62 90/64 110/63  Pulse: (!) 108 (!) 109 (!) 116 (!) 102  Resp: 19 18 19 20   Temp: 99.5 F (37.5 C) 98.3 F (36.8 C) 98.1 F (36.7 C) 98.5 F (36.9 C)  TempSrc: Oral Oral Oral Axillary  SpO2: 92% 91% 99% 96%  Weight:   110.7 kg (244 lb)   Height:        Intake/Output Summary (Last 24 hours) at 05/07/2017 1526 Last data filed at 05/07/2017 0007 Gross per 24 hour  Intake 200 ml  Output -  Net 200 ml   Filed Weights   05/05/17 0428 05/06/17 0529 05/07/17 0534  Weight: 112.5 kg (248 lb) 112 kg (247 lb) 110.7 kg (244 lb)    Examination:  General exam: Sweating. Sleeping. Respiratory system: Clear to auscultation. Respiratory effort normal.  Cardiovascular system: S1 & S2 heard, RRR. No JVD, murmurs, rubs, gallops or clicks. No pedal edema. Gastrointestinal system: Abdomen is nondistended, soft and nontender. No organomegaly or masses felt. Normal bowel sounds heard. Central nervous system: SSleeping is arousableleeping easily  arousable.  Moving extremities spontaneously.  No focal neurological deficits. Extremities: Symmetric 5 x 5 power. Skin: No rashes, lesions or ulcers Psychiatry: Judgement and insight appear normal. Mood & affect appropriate.     Data Reviewed: I have personally reviewed following labs and imaging studies  CBC: Recent Labs  Lab 05/04/17 1140 05/05/17 0643 05/07/17 0607  WBC 12.4* 11.5* 16.3*  NEUTROABS 9.3*  --   --   HGB  --  8.6* 8.9*  HCT 28.6* 27.8* 28.1*  MCV 86.3 88.8 88.4  PLT 184 167 268   Basic Metabolic Panel: Recent Labs  Lab 05/04/17 1140 05/05/17 0643 05/07/17 0607  NA 134* 137 132*  K 4.2 4.1 3.6  CL 96* 100* 96*  CO2 30* 31 24  GLUCOSE 102 94 135*  BUN 5* 5* 5*  CREATININE 0.61 0.47 0.59  CALCIUM 10.4 9.6 9.9  MG  --   --  1.5*   GFR: Estimated Creatinine Clearance: 90.5 mL/min (by C-G formula based on SCr of 0.59 mg/dL). Liver Function Tests: Recent Labs  Lab 05/04/17 1140 05/05/17 0643 05/07/17 0607  AST 41* 44* 41  ALT 24 26 25   ALKPHOS 255* 225* 250*  BILITOT 0.9 0.8 1.2  PROT 6.4 6.0* 6.2*  ALBUMIN 2.4* 2.4* 2.4*   No results for input(s): LIPASE, AMYLASE in the last 168 hours. Recent Labs  Lab 05/04/17 1240  AMMONIA 21   Coagulation Profile: No results for input(s): INR, PROTIME in the last 168 hours. Cardiac Enzymes: No results for input(s): CKTOTAL, CKMB, CKMBINDEX, TROPONINI in the last 168 hours. BNP (last 3 results) No results for input(s): PROBNP in the last 8760 hours. HbA1C: No results for input(s): HGBA1C in the last 72 hours. CBG: Recent Labs  Lab 05/06/17 1126 05/06/17 1705 05/06/17 2031 05/07/17 0749 05/07/17 1211  GLUCAP 140* 119* 136* 132* 125*   Lipid Profile: No results for input(s): CHOL, HDL, LDLCALC, TRIG, CHOLHDL, LDLDIRECT in the last 72 hours. Thyroid Function Tests: No results for input(s): TSH, T4TOTAL, FREET4, T3FREE, THYROIDAB in the last 72 hours. Anemia Panel: No results for input(s):  VITAMINB12, FOLATE, FERRITIN, TIBC, IRON, RETICCTPCT in the last 72 hours. Sepsis Labs: No results for input(s): PROCALCITON, LATICACIDVEN in the last 168 hours.  Recent Results (from the past 240 hour(s))  Culture, blood (Routine X 2) w Reflex to ID Panel     Status: None   Collection Time: 04/28/17  9:13 PM  Result Value Ref Range Status   Specimen Description   Final    BLOOD RIGHT ANTECUBITAL Performed at [facility] [address], [place], [place] [address]    Special Requests   Final    BOTTLES DRAWN AEROBIC AND ANAEROBIC Blood Culture adequate volume Performed at [facility] [address] [place], [place] [address]    Culture   Final    NO GROWTH 5 DAYS Performed at [facility], [place] [address], [place],  [address]    Report Status 05/04/2017 FINAL  Final  Culture, blood (Routine X 2) w Reflex to ID Panel     Status: None   Collection Time: 04/28/17  9:31 PM  Result Value Ref Range Status   Specimen Description   Final  BLOOD RIGHT ANTECUBITAL Performed at [facility] [address] [place], [place] [address]    Special Requests   Final    BOTTLES DRAWN AEROBIC AND ANAEROBIC Blood Culture adequate volume Performed at [facility] [address] [place], [place] [address]    Culture   Final    NO GROWTH 5 DAYS Performed at [facility], [place] [address], [place], [place] [address]    Report Status 05/04/2017 FINAL  Final  Culture, Blood     Status: None (Preliminary result)   Collection Time: 05/04/17 11:40 AM  Result Value Ref Range Status   Specimen Description BLOOD PORTA CATH  Final   Special Requests   Final    BOTTLES DRAWN AEROBIC AND ANAEROBIC Blood Culture adequate volume   Culture   Final    NO GROWTH 3 DAYS Performed at [facility], [place] [address], [place],  [address]    Report Status PENDING  Incomplete  Urine Culture     Status:  Abnormal   Collection Time: 05/04/17 12:16 PM  Result Value Ref Range Status   Specimen Description   Final    URINE, CLEAN CATCH Performed at [facility], [place] [address] [place],  [address]    Special Requests   Final    NONE Performed at [facility], [place] [address], [place],  [address]    Culture MULTIPLE SPECIES PRESENT, SUGGEST RECOLLECTION (A)  Final   Report Status 05/05/2017 FINAL  Final         Radiology Studies: Dg Chest Port 1v Same Day  Result Date: 05/06/2017 CLINICAL DATA:  Weakness and shortness of breath. EXAM: PORTABLE CHEST 1 VIEW COMPARISON:  04/28/2017 CT, 04/24/2017 radiographs and prior studies. FINDINGS: Cardiomegaly and right Port-A-Cath with tip overlying the mid SVC again noted. Right hemithorax volume loss and mild right basilar atelectasis/scarring again noted. There is no evidence of airspace disease, pleural effusion or pneumothorax. No acute bony abnormalities are present. IMPRESSION: Unchanged appearance of the chest with mild right basilar atelectasis/scarring. Electronically Signed   By: Margarette Canada M.D.   On: 05/06/2017 07:54        Scheduled Meds: . aspirin EC  81 mg Oral Daily  . bisacodyl  10 mg Oral BID  . citalopram  20 mg Oral Daily  . feeding supplement (ENSURE ENLIVE)  237 mL Oral BID BM  . insulin detemir  10 Units Subcutaneous QHS  . lactulose  20 g Oral Daily  . midodrine  5 mg Oral TID WC  . morphine  30 mg Oral Q12H  . multivitamin  1 tablet Oral Daily  . oxybutynin  10 mg Oral q morning - 10a  . pantoprazole  40 mg Oral BID  . polyethylene glycol  17 g Oral BID  . pregabalin  200 mg Oral Daily  . rivaroxaban  20 mg Oral Q breakfast  . vitamin B-12  2,500 mcg Oral Daily   Continuous Infusions: . sodium chloride 75 mL/hr at 05/07/17 1026  . ceFEPime (MAXIPIME) IV    . vancomycin Stopped (05/07/17 1218)     LOS: 3 days    Time spent: 35  mins    Irine Seal, MD [facility] Pager [phone]  If 7PM-7AM, please contact night-coverage [email or web address] Password [facility] 05/07/2017, 3:26 PM

## 2017-05-07 NOTE — Progress Notes (Signed)
Date:  May 07, 2017 Chart reviewed for concurrent status and case management needs.  Will continue to follow patient progress.  Discharge Planning: following for needs.  None present at this time of review. Expected discharge date: May 10, 2017 Kylar Leonhardt, BSN, RN3, CCM   [phone]  

## 2017-05-07 NOTE — Progress Notes (Addendum)
Initial Nutrition Assessment  DOCUMENTATION CODES:   Morbid obesity  INTERVENTION:   Ensure Enlive po BID, each supplement provides 350 kcal and 20 grams of protein  Magic cup BID with meals, each supplement provides 290 kcal and 9 grams of protein  NUTRITION DIAGNOSIS:   Increased nutrient needs related to cancer and cancer related treatments, chronic illness as evidenced by estimated needs.  GOAL:   Patient will meet greater than or equal to 90% of their needs  MONITOR:   PO intake, Supplement acceptance, Labs, Weight trends, I & O's  REASON FOR ASSESSMENT:   Malnutrition Screening Tool    ASSESSMENT:   Pt with PMH of pancreatic cancer with liver metastases receiving chemotherapy (last treatment 04/09/17), multiple recent admission, OSA on CPAP, DM, HTN, HLD, GERD presents with abdominal pain and fever from unclear source   Pt resting at visit, unable to elaborate on RD questions. Spoke with pt's husband. Reports pt has slowly decreased PO intake to 2-3 "lighter" meals per day. Per meal completion records, pt consumed 100% of meal yesterday, consisting of cream of wheat, applesauce and orange sherbet.  He reports pt has lost 65 lbs in < 10 months.  Per chart, pt's weight has declined.   Labs reviewed; CBG 95-140, Na 132, BUN 5, Magnesium 1.5, Albumin 2.4, Hemoglobin 8.9 Medications reviewed; dulcolax, multivitamin, Levemir, lactulose, Protonix, Miralax, vitamin B12  NUTRITION - FOCUSED PHYSICAL EXAM:    Most Recent Value  Orbital Region  No depletion  Upper Arm Region  No depletion  Thoracic and Lumbar Region  No depletion  Buccal Region  No depletion  Temple Region  No depletion  Clavicle Bone Region  No depletion  Clavicle and Acromion Bone Region  No depletion  Scapular Bone Region  No depletion  Dorsal Hand  Mild depletion  Patellar Region  Mild depletion  Anterior Thigh Region  Mild depletion  Posterior Calf Region  Mild depletion  Edema (RD Assessment)   None      Diet Order:  Diet regular Room service appropriate? Yes; Fluid consistency: Thin  EDUCATION NEEDS:   Not appropriate for education at this time  Skin:  Skin Assessment: Reviewed RN Assessment  Last BM:  05/05/17  Height:   Ht Readings from Last 1 Encounters:  05/04/17 5\' 3"  (1.6 m)   Weight:   Wt Readings from Last 1 Encounters:  05/07/17 244 lb (110.7 kg)   Ideal Body Weight:  52.3 kg  BMI:  Body mass index is 43.22 kg/m.  Estimated Nutritional Needs:   Kcal:  2500-2700  Protein:  95-115 grams  Fluid:  >/= 1.5 L/d  Parks Ranger, MS, RDN, LDN 05/07/2017 1:59 PM

## 2017-05-07 NOTE — Progress Notes (Signed)
Referring Physician(s): Sherrill,B  Supervising Physician: Aletta Edouard  Patient Status:  [facility] - In-pt  Chief Complaint: Pancreatic cancer, fever   Subjective: Patient familiar to IR service from prior liver lesion bx in May of 2018, and port a cath placement on 08/28/16; she has a history of metastatic pancreatic cancer and right cephalic vein thrombosis in August 2018, currently on Xarelto.  She was recently admitted with fever, confusion,abdominal pain and hypotension. Recent blood cx neg to date, urine cx with mult species. She has a poor performance status and is not a candidate for further chemotherapy. She will be evaluated for home hospice care.  Request now received from oncology for Port-A-Cath removal. Past Medical History:  Diagnosis Date  . Allergy   . Anxiety   . Arthritis   . Benign essential HTN 09/23/2014  . Cancer ([place])   . Chronic kidney disease    uti  . Depression   . Dyslipidemia   . Elevated liver enzymes   . Family history of anesthesia complication    father has a severe hard time waking up  . Gallstones    a. Seen on CT 01/2014.  Marland Kitchen GERD (gastroesophageal reflux disease)   . Hard of hearing   . Hepatic steatosis   . History of frequent urinary tract infections   . Hyperlipidemia   . Hypertension   . Lateral epicondylitis of right elbow   . Mental disorder   . Meralgia paresthetica of right side 10/02/2012   slight at 05/2014  . Migraine headache   . Obesity   . OSA (obstructive sleep apnea)    severe with AHI 37/hr now on CPAP at 18cm H2O  . Osteoarthritis   . Pancreatic cancer metastasized to liver ([place])   . Pneumonia    Feb 2018  . Raynaud disease    in feet per patient   . Sepsis ([place]) 02/23/2017  . Sleep apnea    wears c-pap  . Urinary tract infection    Past Surgical History:  Procedure Laterality Date  . ABDOMINAL HYSTERECTOMY  1/04   partial  . BLADDER SUSPENSION  6/10  . CARDIAC CATHETERIZATION    . carpel tunnel  left/right  7/08, 8/08 Bilateral 8/08 and 7/08  . carpel tunnel rel Right 4/12  . CHOLECYSTECTOMY N/A 06/11/2014   Procedure: LAPAROSCOPIC CHOLECYSTECTOMY WITH ATTEMPTED INTRAOPERATIVE CHOLANGIOGRAM;  Surgeon: Jackolyn Confer, MD;  Location: [facility];  Service: General;  Laterality: N/A;  . COLONOSCOPY    . ERCP N/A 10/19/2014   Procedure: ENDOSCOPIC RETROGRADE CHOLANGIOPANCREATOGRAPHY (ERCP);  Surgeon: Ladene Artist, MD;  Location: [facility];  Service: Endoscopy;  Laterality: N/A;  . INCISIONAL HERNIA REPAIR N/A 06/30/2016   Procedure: LAPAROSCOPIC REPAIR OF INCISIONAL HERNIA WITH MESH;  Surgeon: Jackolyn Confer, MD;  Location: [facility];  Service: General;  Laterality: N/A;  . INSERTION OF MESH N/A 06/30/2016   Procedure: INSERTION OF MESH;  Surgeon: Jackolyn Confer, MD;  Location: [facility];  Service: General;  Laterality: N/A;  . IR CV LINE INJECTION  11/24/2016  . IR FLUORO GUIDE PORT INSERTION RIGHT  08/28/2016  . IR US GUIDE VASC ACCESS RIGHT  08/28/2016  . JOINT REPLACEMENT    . KNEE ARTHROSCOPY Left 12/12  . KNEE ARTHROSCOPY Right 12/06  . LEFT HEART CATHETERIZATION WITH CORONARY ANGIOGRAM N/A 03/10/2014   Procedure: LEFT HEART CATHETERIZATION WITH CORONARY ANGIOGRAM;  Surgeon: Sinclair Grooms, MD;  [facility];  Service: Cardiovascular;  Laterality: N/A;  . PLANTAR  FASCIA RELEASE Right 12/10  . radial tunnel release     right arm   . ROTATOR CUFF REPAIR Left 6/11  . tennis elbow release Right 7/04  . TOTAL KNEE ARTHROPLASTY Left 09/10/2012   Procedure: TOTAL KNEE ARTHROPLASTY- left;  Surgeon: Garald Balding, MD;  Location: [facility];  Service: Orthopedics;  Laterality: Left;  Left total knee arthroplasty     Allergies: Topamax [topiramate]; Aleve [naproxen sodium]; Bee venom; Echinacea; Other; Sulfa antibiotics; and Advil [ibuprofen]  Medications: Prior to Admission medications   Medication Sig Start Date End Date Taking? Authorizing Provider  acetaminophen (TYLENOL) 325 MG  tablet Take 2 tablets (650 mg total) by mouth 2 (two) times daily at 10 AM and 5 PM. 05/01/17  Yes Georgette Shell, MD  aspirin EC 81 MG tablet Take 81 mg by mouth daily.   Yes [provider]  bisacodyl (DULCOLAX) 5 MG EC tablet Take 2 tablets (10 mg total) by mouth 2 (two) times daily. 05/01/17  Yes Georgette Shell, MD  citalopram (CELEXA) 20 MG tablet TAKE ONE (1) TABLET BY MOUTH EVERY DAY Patient taking differently: Take 20mg  by mouth daily 01/22/17  Yes Hoyt Koch, MD  Cranberry-Vitamin C-Vitamin E (CRANBERRY PLUS VITAMIN C PO) Take 1 tablet by mouth daily. 4200 mg   Yes [provider]  cyclobenzaprine (FLEXERIL) 5 MG tablet TAKE ONE (1) TABLET BY MOUTH 3 TIMES DAILY AS NEEDED FOR MUSCLE SPASMS Patient taking differently: Take 5mg  by mouth as needed for muscle spams 04/19/17  Yes Ladell Pier, MD  eletriptan (RELPAX) 40 MG tablet Take 1 tablet (40 mg total) by mouth every 2 (two) hours as needed for migraine. 06/26/16  Yes Dennie Bible, NP  HYDROmorphone (DILAUDID) 4 MG tablet Take 0.5 tablets (2 mg total) by mouth every 4 (four) hours as needed for severe pain. 04/27/17  Yes Ladell Pier, MD  insulin detemir (LEVEMIR) 100 unit/ml SOLN Inject 10 Units into the skin at bedtime.   Yes [provider]  insulin lispro (HUMALOG) 100 UNIT/ML injection Inject 2-10 Units into the skin. Sliding Scale   Yes [provider]  lactulose (CHRONULAC) 10 GM/15ML solution Take 30 mLs (20 g total) by mouth daily. 05/01/17  Yes Georgette Shell, MD  lidocaine-prilocaine (EMLA) cream APPLY 1 APPLICATION TOPICALLY AS NEEDED.APPLY TO PORT A CATH SITE 1 HOUR PRIOR TO NEEDLE STICK. 03/26/17  Yes Ladell Pier, MD  LORazepam (ATIVAN) 1 MG tablet Take 1 tablet (1 mg total) by mouth every 8 (eight) hours as needed for anxiety or sedation (or nausea). 04/27/17  Yes Ladell Pier, MD  LYRICA 200 MG capsule Take 200 mg by mouth daily. 04/03/17  Yes  [provider]  midodrine (PROAMATINE) 5 MG tablet Take 1 tablet (5 mg total) by mouth 3 (three) times daily with meals. 04/27/17  Yes Ladell Pier, MD  morphine (MS CONTIN) 30 MG 12 hr tablet Take 1 tablet (30 mg total) by mouth every 12 (twelve) hours. 04/27/17  Yes Ladell Pier, MD  multivitamin-lutein [facility]) CAPS capsule Take 1 capsule by mouth daily.   Yes [provider]  nitroGLYCERIN (NITROSTAT) 0.4 MG SL tablet Place 1 tablet (0.4 mg total) under the tongue every 5 (five) minutes as needed for chest pain. 06/28/15  Yes Belva Crome, MD  ondansetron (ZOFRAN) 4 MG tablet Take 1 tablet (4 mg total) by mouth every 6 (six) hours as needed for nausea. 05/01/17  Yes Rodena Piety,  Noland Fordyce, MD  oxybutynin (DITROPAN-XL) 10 MG 24 hr tablet Take 1 tablet (10 mg total) by mouth every morning. 03/19/17  Yes Ladell Pier, MD  pantoprazole (PROTONIX) 40 MG tablet TAKE ONE (1) TABLET BY MOUTH TWO (2) TIMES DAILY Patient taking differently: Take 40mg  by mouth twice daily 09/25/16  Yes Ladene Artist, MD  polyethylene glycol (MIRALAX / GLYCOLAX) packet Take 17 g by mouth 2 (two) times daily. 05/01/17  Yes Georgette Shell, MD  prochlorperazine (COMPAZINE) 10 MG tablet Take 10 mg by mouth every 6 (six) hours as needed for nausea or vomiting.   Yes [provider]  QC MAGNESIUM CITRATE 1.745 GM/30ML SOLN Take 296 mLs by mouth once. 04/27/17  Yes [provider]  simethicone (MYLICON) 244 MG chewable tablet Chew 125-250 mg by mouth 2 (two) times daily as needed for flatulence.   Yes [provider]  sorbitol 70 % solution Take 30 mLs by mouth daily as needed (for constipation). 04/26/17  Yes Donne Hazel, MD  vitamin B-12 (CYANOCOBALAMIN) 1000 MCG tablet Take 2,500 mcg by mouth every morning.   Yes [provider]  XARELTO 20 MG TABS tablet Take 20 mg daily by mouth. 01/31/17  Yes [provider]  oxyCODONE (OXY IR/ROXICODONE) 5 MG  immediate release tablet Take 1-2 tablets (5-10 mg total) by mouth every 6 (six) hours as needed for severe pain. Patient not taking: Reported on 05/04/2017 05/04/17   Owens Shark, NP     Vital Signs: BP 90/64 (BP Location: Right Arm)   Pulse (!) 116   Temp 98.1 F (36.7 C) (Oral)   Resp 19   Ht 5\' 3"  (1.6 m)   Wt 244 lb (110.7 kg)   SpO2 99%   BMI 43.22 kg/m   Physical Exam awake, answers questions appropriately if addressed directly; chest- sl dim BS rt base ,left clear; clean, intact ,NT right chest wall Port-A-Cath ; no erythema noted; heart with regular rate and rhythm.  Abdomen obese, soft, positive bowel sounds, mildly tender.  Imaging: Dg Chest Port 1v Same Day  Result Date: 05/06/2017 CLINICAL DATA:  Weakness and shortness of breath. EXAM: PORTABLE CHEST 1 VIEW COMPARISON:  04/28/2017 CT, 04/24/2017 radiographs and prior studies. FINDINGS: Cardiomegaly and right Port-A-Cath with tip overlying the mid SVC again noted. Right hemithorax volume loss and mild right basilar atelectasis/scarring again noted. There is no evidence of airspace disease, pleural effusion or pneumothorax. No acute bony abnormalities are present. IMPRESSION: Unchanged appearance of the chest with mild right basilar atelectasis/scarring. Electronically Signed   By: Margarette Canada M.D.   On: 05/06/2017 07:54    Labs:  CBC: Recent Labs    04/28/17 1327 04/29/17 0300 05/04/17 1140 05/05/17 0643 05/07/17 0607  WBC 12.3* 10.7* 12.4* 11.5* 16.3*  HGB 8.8* 8.4*  --  8.6* 8.9*  HCT 27.0* 26.1* 28.6* 27.8* 28.1*  PLT 175 154 184 167 178    COAGS: Recent Labs    08/16/16 1122 08/28/16 0823 03/12/17 2129  INR 1.00 1.11 1.28  APTT  --  28 25    BMP: Recent Labs    04/29/17 0300 05/04/17 1140 05/05/17 0643 05/07/17 0607  NA 136 134* 137 132*  K 3.9 4.2 4.1 3.6  CL 102 96* 100* 96*  CO2 27 30* 31 24  GLUCOSE 91 102 94 135*  BUN 5* 5* 5* 5*  CALCIUM 9.2 10.4 9.6 9.9  CREATININE 0.52 0.61 0.47  0.59  GFRNONAA >60 >60 >  60 >60  GFRAA >60 >60 >60 >60    LIVER FUNCTION TESTS: Recent Labs    04/24/17 0750 05/04/17 1140 05/05/17 0643 05/07/17 0607  BILITOT 0.8 0.9 0.8 1.2  AST 24 41* 44* 41  ALT 20 24 26 25   ALKPHOS 218* 255* 225* 250*  PROT 7.1 6.4 6.0* 6.2*  ALBUMIN 2.8* 2.4* 2.4* 2.4*    Assessment and Plan: Pt with history of metastatic pancreatic cancer since 04/9474 and right cephalic vein thrombosis in August 2018, currently on Xarelto.  Status post right chest wall Port-A-Cath placement in June 2018 ;she was recently admitted with fever, confusion,abdominal pain and hypotension. Recent blood cx neg to date, urine cx with mult species; WBC 16.3. She has a poor performance status and is not a candidate for further chemotherapy. She will be evaluated for home hospice care.  Request now received from oncology for Port-A-Cath removal.Risks and benefits discussed with the patient/family including, but not limited to bleeding, infection, injury to adjacent structures. All of the patient's questions were answered, patient is agreeable to proceed. Consent signed and in chart. Procedure tent scheduled for 2/12. We will hold xarelto until after port removal.      Electronically Signed: D. Rowe Robert, PA-C 05/07/2017, 3:09 PM   I spent a total of 20 minutes at the the patient's bedside AND on the patient's hospital floor or unit, greater than 50% of which was counseling/coordinating care for port a cath removal    Patient ID: Heather Frederick, female   DOB: [birth year], 62 y.o.   MRN: [id number]

## 2017-05-07 NOTE — Progress Notes (Addendum)
IP PROGRESS NOTE  Subjective:   [name] was admitted Friday with confusion, hypotension, and fever.  She had an intermittent fever over the weekend.  She is diaphoretic this morning.  She continues to have abdominal pain.  Her husband is at the bedside.  Objective: Vital signs in last 24 hours: Blood pressure 90/64, pulse (!) 116, temperature 98.1 F (36.7 C), temperature source Oral, resp. rate 19, height _0  (1.6 m), weight 244 lb (110.7 kg), SpO2 99 %.  Intake/Output from previous day: 02/10 0701 - 02/11 0700 In: 440 [P.O.:240; IV Piggyback:200] Out: -   Physical Exam:  HEENT: No thrush Lungs: Clear anteriorly Cardiac: Regular rate and rhythm Extremities: No leg edema Neurologic: Alert, follows commands, not oriented to day, month, or year.  Portacath/PICC-without erythema  Lab Results: Recent Labs    05/05/17 0643 05/07/17 0607  WBC 11.5* 16.3*  HGB 8.6* 8.9*  HCT 27.8* 28.1*  PLT 167 178    BMET Recent Labs    05/05/17 0643 05/07/17 0607  NA 137 132*  K 4.1 3.6  CL 100* 96*  CO2 31 24  GLUCOSE 94 135*  BUN 5* 5*  CREATININE 0.47 0.59  CALCIUM 9.6 9.9    No results found for: CEA1  Studies/Results: Dg Chest Port 1v Same Day  Result Date: 05/06/2017 CLINICAL DATA:  Weakness and shortness of breath. EXAM: PORTABLE CHEST 1 VIEW COMPARISON:  04/28/2017 CT, 04/24/2017 radiographs and prior studies. FINDINGS: Cardiomegaly and right Port-A-Cath with tip overlying the mid SVC again noted. Right hemithorax volume loss and mild right basilar atelectasis/scarring again noted. There is no evidence of airspace disease, pleural effusion or pneumothorax. No acute bony abnormalities are present. IMPRESSION: Unchanged appearance of the chest with mild right basilar atelectasis/scarring. Electronically Signed   By: Margarette Canada M.D.   On: 05/06/2017 07:54    Medications: I have reviewed the patient's current medications.  Assessment/Plan: 1. Metastatic pancreas  cancer ? Pancreas body mass and liver metastases noted on CT abdomen/pelvis 08/11/2016 ? Ultrasound-guided biopsy of a right liver lesion 08/16/2016 revealed poorly differentiated adenocarcinoma consistent with pancreas cancer ? Foundation 1-microsatellite stable; tumor mutational burden 1; ERBB2 amplification ? Cycle 1 gemcitabine/Abraxane 09/13/2016; 09/29/2016 ? Cycle 2 gemcitabine/Abraxane 10/11/2016, 10/18/2016 ? Cycle 3 gemcitabine/Abraxane 11/01/2016, 11/08/2016 ? Cycle 4 gemcitabine/Abraxane 11/23/2016,11/29/2016 ? CT chest 12/06/2016-liver lesions appear smaller ? Cycle 5 gemcitabine/Abraxane 12/13/2016 ? CT abdomen/pelvis 01/01/2017-new and enlarging hepatic masses. Enlarging pancreatic mass. ? Cycle 6 gemcitabine/Abraxane 01/03/2017 ? Rising CA 19-9, increased pain October 2018 ? Cycle 1 FOLFOX 01/31/2017 ? Cycle 2 FOLFOX 02/19/2017 ? Cycle 3FOLFIRINOX 03/06/2017 ? CT 03/11/2017 (compared to 01/01/2017) increased liver lesions and enlargement of the pancreas mass ? Cycle4FOLFIRINOX 03/21/2017 (oxaliplatin dose reduced and Neulasta added) ? Cycle 5 FOLFIRINOX 04/09/2017 ? CT 04/13/2017-decrease in the size of the pancreas mass, no change in multiple liver lesions  2. Pain secondary to pancreas cancer, MS Contin added 03/13/2017  3. Hypertension  4. Sleep apnea  5. Chronic low back pain  6. Recurrent urinary tract infections  7. Depression  8. Migraine headaches  9. Port-A-Cath placement 08/28/2016  10. Rash following cycle 1 gemcitabine/Abraxane-drug rash?  11. Nausea/vomiting following cycle 1 gemcitabine/Abraxane-antiemetic regimen adjusted with addition of Aloxi  12. Diabetes  13. Right cephalic vein thrombosis [id number]/56/4332-[id number] with Xarelto  14. CT chest 12/06/2016 done to evaluate dyspnea-several 3-4 mm nodular opacities in the lung parenchyma etiology uncertain. Known mass in the body of the pancreas measuring 3.3 x 2.4  cm;small enhancing  lesion in the anterior dome of the liver measures 8 mm.  15. Right forearm rash following cycle 5 day 8 gemcitabine/Abraxane. Resolved  16.Klebsiella urinary tract infection 03/14/2017  17.Admission 04/13/2017 with diaphoresis and tachycardia-resolved, 1 blood culture positive for a coagulase-negative Staphylococcus  18. Admission 04/19/2017 with recurrent hypotension  19. Admission 04/24/2017 with recurrent hypotension and diaphoresis  20.Admission 04/28/2017 with diaphoresis, fever, and weakness  21.  Admission 05/04/2017 with altered mental status and fever   [name] continues to have intermittent fever and confusion.  She has a poor performance status.  Cultures from the urine and blood 05/04/2017 are negative to date.  I suspect the fever and diaphoresis is most likely related to a "tumor fever ", but it is possible she has an unidentified systemic infection.  I recommend removing the Port-A-Cath to see if this will help her symptoms.  She is not a candidate for further systemic chemotherapy.  I discussed hospice care and nursing facility placement with her husband.  He asked that I return later today to discuss options with other family members.  Recommendations: 1.  Remove Port-A-Cath 2.  Continue MS Contin and oxycodone for pain 3.  I will return later today to discuss the poor prognosis with her family, and recommend Hospice care   I returned at approximately 2 PM for further discussion with [name] and her family.  Her children and husband were present.  We discussed the prognosis, her current status, and treatment options.  She understands the intermittent fever, dizziness, and diaphoresis may be related to an infection.  There is no apparent source for infection.  She agrees to removal of the Port-A-Cath.  We discussed Hospice care.  Her goal is to stay at home.  She agrees to a [facility] hosp[facility] referral for home care.  Her family is interested  in looking into available resources for in-home care.      LOS: 3 days   Betsy Coder, MD   05/07/2017, 7:28 AM

## 2017-05-08 ENCOUNTER — Inpatient Hospital Stay (HOSPITAL_COMMUNITY): Payer: 59

## 2017-05-08 ENCOUNTER — Telehealth: Payer: Self-pay

## 2017-05-08 ENCOUNTER — Encounter (HOSPITAL_COMMUNITY): Payer: Self-pay | Admitting: Interventional Radiology

## 2017-05-08 HISTORY — PX: IR REMOVAL TUN ACCESS W/ PORT W/O FL MOD SED: IMG2290

## 2017-05-08 LAB — BASIC METABOLIC PANEL
Anion gap: 11 (ref 5–15)
BUN: 7 mg/dL (ref 6–20)
CHLORIDE: 97 mmol/L — AB (ref 101–111)
CO2: 26 mmol/L (ref 22–32)
Calcium: 9.7 mg/dL (ref 8.9–10.3)
Creatinine, Ser: 0.54 mg/dL (ref 0.44–1.00)
GFR calc non Af Amer: >60 mL/min (ref >60)
Glucose, Bld: 101 mg/dL — ABNORMAL HIGH (ref 65–99)
POTASSIUM: 4.1 mmol/L (ref 3.5–5.1)
Sodium: 134 mmol/L — ABNORMAL LOW (ref 135–145)

## 2017-05-08 LAB — CBC WITH DIFFERENTIAL/PLATELET
Basophils Absolute: 0 10*3/uL (ref 0.0–0.1)
Basophils Relative: 0 %
Eosinophils Absolute: 0.4 10*3/uL (ref 0.0–0.7)
Eosinophils Relative: 3 %
HEMATOCRIT: 26.5 % — AB (ref 36.0–46.0)
Hemoglobin: 8.4 g/dL — ABNORMAL LOW (ref 12.0–15.0)
LYMPHS PCT: 11 %
Lymphs Abs: 1.4 10*3/uL (ref 0.7–4.0)
MCH: 28.4 pg (ref 26.0–34.0)
MCHC: 31.7 g/dL (ref 30.0–36.0)
MCV: 89.5 fL (ref 78.0–100.0)
MONO ABS: 1.9 10*3/uL — AB (ref 0.1–1.0)
Monocytes Relative: 14 %
NEUTROS ABS: 9.4 10*3/uL — AB (ref 1.7–7.7)
Neutrophils Relative %: 72 %
Platelets: 167 10*3/uL (ref 150–400)
RBC: 2.96 MIL/uL — AB (ref 3.87–5.11)
RDW: 18.8 % — AB (ref 11.5–15.5)
WBC: 13.1 10*3/uL — ABNORMAL HIGH (ref 4.0–10.5)

## 2017-05-08 LAB — MAGNESIUM: Magnesium: 2 mg/dL (ref 1.7–2.4)

## 2017-05-08 LAB — CREATININE, SERUM
Creatinine, Ser: 0.56 mg/dL (ref 0.44–1.00)
GFR calc Af Amer: >60 mL/min (ref >60)
GFR calc non Af Amer: >60 mL/min (ref >60)

## 2017-05-08 LAB — PROTIME-INR
INR: 1.85
Prothrombin Time: 21.2 seconds — ABNORMAL HIGH (ref 11.4–15.2)

## 2017-05-08 LAB — GLUCOSE, CAPILLARY
GLUCOSE-CAPILLARY: 99 mg/dL (ref 65–99)
GLUCOSE-CAPILLARY: 109 mg/dL — AB (ref 65–99)
Glucose-Capillary: 85 mg/dL (ref 65–99)
Glucose-Capillary: 117 mg/dL — ABNORMAL HIGH (ref 65–99)

## 2017-05-08 MED ORDER — CEFAZOLIN SODIUM-DEXTROSE 2-4 GM/100ML-% IV SOLN
2.0000 g | Freq: Once | INTRAVENOUS | Status: AC
  Administered 2017-05-08: 2 g via INTRAVENOUS

## 2017-05-08 MED ORDER — INSULIN DETEMIR 100 UNIT/ML ~~LOC~~ SOLN
5.0000 [IU] | Freq: Every day | SUBCUTANEOUS | Status: DC
  Administered 2017-05-08: 5 [IU] via SUBCUTANEOUS
  Filled 2017-05-08: qty 0.05

## 2017-05-08 MED ORDER — MIDAZOLAM HCL 2 MG/2ML IJ SOLN
INTRAMUSCULAR | Status: AC
  Filled 2017-05-08: qty 2

## 2017-05-08 MED ORDER — FENTANYL CITRATE (PF) 100 MCG/2ML IJ SOLN
INTRAMUSCULAR | Status: AC
  Filled 2017-05-08: qty 2

## 2017-05-08 MED ORDER — FENTANYL CITRATE (PF) 100 MCG/2ML IJ SOLN
INTRAMUSCULAR | Status: AC | PRN
  Administered 2017-05-08: 25 ug via INTRAVENOUS

## 2017-05-08 MED ORDER — LIDOCAINE HCL (PF) 1 % IJ SOLN
INTRAMUSCULAR | Status: AC
  Filled 2017-05-08: qty 30

## 2017-05-08 MED ORDER — LIDOCAINE HCL (PF) 1 % IJ SOLN
INTRAMUSCULAR | Status: AC | PRN
  Administered 2017-05-08: 5 mL

## 2017-05-08 MED ORDER — LIDOCAINE HCL (PF) 2 % IJ SOLN
INTRAMUSCULAR | Status: AC
  Filled 2017-05-08: qty 10

## 2017-05-08 MED ORDER — DEXTROSE-NACL 5-0.9 % IV SOLN
INTRAVENOUS | Status: DC
  Filled 2017-05-08: qty 1000

## 2017-05-08 MED ORDER — PREDNISONE 5 MG PO TABS
5.0000 mg | ORAL_TABLET | Freq: Two times a day (BID) | ORAL | Status: DC
  Administered 2017-05-08 – 2017-05-09 (×3): 5 mg via ORAL
  Filled 2017-05-08 (×3): qty 1

## 2017-05-08 MED ORDER — MIDAZOLAM HCL 2 MG/2ML IJ SOLN
INTRAMUSCULAR | Status: AC | PRN
  Administered 2017-05-08: 0.5 mg via INTRAVENOUS

## 2017-05-08 MED ORDER — DEXTROSE-NACL 5-0.9 % IV SOLN
INTRAVENOUS | Status: DC
  Administered 2017-05-08: 09:00:00 via INTRAVENOUS

## 2017-05-08 MED ORDER — CEFAZOLIN SODIUM-DEXTROSE 2-4 GM/100ML-% IV SOLN
INTRAVENOUS | Status: AC
  Administered 2017-05-08: 2 g via INTRAVENOUS
  Filled 2017-05-08: qty 100

## 2017-05-08 NOTE — Sedation Documentation (Signed)
Patient is resting comfortably. 

## 2017-05-08 NOTE — Progress Notes (Addendum)
[facility])  Received request from [place] for family interest in [facility] services at home after discharge. Chart and patient information reviewed by [facility] physician. Hospice eligibility has been confirmed.   Met with spouse Remo Lipps at bedside while patient was in radiology. Initiated hospice education related to philosophy, services and team approach to care. Remo Lipps verbalized good understanding of information discussed. Discharge date has not been determined per spouse. [facility] contact information given to spouse during this visit.   Please send signed and completed out of facility DNR form with patient.  Please send prescriptions for discharge comfort medications.   DME needs discussed. No additional needs identified. Patient already has a electronic, split, king sized bed that she prefers to continue to use. She has walker, BSC, shower chair, wheelchair and CPAP machine from [facility].   [facility] Patient Access Specialists aware of above and will schedule RN admission visit in the home.   RNCM Velva Harman is aware of above.   Thank you for this referral. Please call with hospice related questions.   [facility] hospital liaison will continue to follow until transfer home.   Thank  Dennis Bast  Erling Conte, [employer]

## 2017-05-08 NOTE — Progress Notes (Addendum)
PROGRESS NOTE    Heather Frederick  TDS:[id number] DOB: 1955-07-06 DOA: 05/04/2017 PCP: [name], MD    Brief Narrative:  Heather Frederick is a 62 year old woman with a past medical history relevant for type 2 diabetes, chronic pain syndrome, stage IV pancreatic cancer on modified FOLFIRINOX last cycle 5 on 04/09/2017, migraines, depression, right cephalic vein thrombus on rivaroxaban, sleep apnea who was admitted from oncology clinic with confusion, vertigo, chills and sweats and found to be febrile to 101.2 with unclear source.     Assessment & Plan:   Principal Problem:   Fever Active Problems:   Altered mental status   GERD (gastroesophageal reflux disease)   Elevated LFTs   Depression   Pancreatic carcinoma metastatic to liver (HCC)   Anemia   Thrombocytopenia (HCC)   Chronic pain   Anxiety and depression   UTI (urinary tract infection)   Type 2 diabetes mellitus with complication (HCC)  #1 fever Likely secondary to metastatic pancreatic adenocarcinoma.  Initial concern was for UTI however urinalysis with trace leukocytes, nitrite negative, 6-30 WBCs. Urine cultures done with multiple species present. Chest x-ray done negative for any acute infiltrate. Blood cultures from 05/04/2017 pending with no growth to date. Patient diaphoretic. Patient noted to have some fevers over the weekend and last night with a T-max of 102.1 overnight. Concerned that this is likely secondary to tumor burden. Patient was started empirically on IV vancomycin and cefepime. Patient was seen by oncology were recommending removal of Port-A-Cath which will be done today 05/08/2017.  She was started on prednisone for fever secondary to tumor burden per oncology.  Discontinue IV antibiotics.  Oncology met with the family again and focus has shifted to comfort measures.  Referral to [facility] of [facility] has been made by oncology.  Patient likely home with hospice hopefully tomorrow.  #2 metastatic pancreatic  adenocarcinoma Patient has been seen by oncology.  Focus is been shifted to comfort measures.  Continue current pain regimen of MS Contin, Flexeril as needed, oxycodone for breakthrough pain and Dilaudid for severe pain.  Porta- cath to be removed today.  Referral made for [facility] per oncology.  Follow.  #3 depression/anxiety Continue current regimen of citalopram and Ativan as needed.  #4 bladder spasms Continue oxybutynin.  #5 migraines Currently stable. Continue eletriptan 40 mg every 2 hours when necessary.  #6 cephalic vein thrombus Currently stable. Continue anticoagulation with Xarelto.  #7 diabetes mellitus type 2 Hemoglobin A1c was 7.4 09/29/2016. CBGs have ranged from 85-117.  Patient noted to have low CBGs.  Decrease Levemir to 5 units daily.   We will discontinue CBGs.  Focus has shifted to comfort.    #8 GERD Continue PPI.    DVT prophylaxis: xarelto Code Status: Full Family Communication: Updated patient and husband at bedside. Disposition Plan: Home with hospice hopefully in the next 24-48 hours.   Consultants:   Oncology: Dr Benay Spice 05/07/2017  Interventional radiology  Procedures:   Chest x-ray 05/06/2017  Port-A-Cath removal pending 05/08/2017  Antimicrobials:   IV Cefepime 05/06/2017>>>>> 05/08/2017  IV Rocephin 05/04/2017>>>>05/06/2017  IV Vancomycin 05/06/2017>>>>> 05/08/2017   Subjective: Easily arousable.  Denies shortness of breath.  Denies chest pain.  Husband at bedside.  Objective: Vitals:   05/07/17 1512 05/07/17 2135 05/07/17 2330 05/08/17 0631  BP: 110/63 104/76  (!) 102/49  Pulse: (!) 102 (!) 118  90  Resp: '20 18  18  ' Temp: 98.5 F (36.9 C) (!) 102.1 F (38.9 C) 99.1 F (37.3 C) 97.9  F (36.6 C)  TempSrc: Axillary Oral Oral Oral  SpO2: 96% 97%  100%  Weight:      Height:        Intake/Output Summary (Last 24 hours) at 05/08/2017 1001 Last data filed at 05/08/2017 0929 Gross per 24 hour  Intake 1102.5 ml  Output  -  Net 1102.5 ml   Filed Weights   05/05/17 0428 05/06/17 0529 05/07/17 0534  Weight: 112.5 kg (248 lb) 112 kg (247 lb) 110.7 kg (244 lb)    Examination:  General exam: Sleeping. Respiratory system: Clear to auscultation.  No wheezes, no crackles no rhonchi.  Respiratory effort normal. Cardiovascular system: Regular rate and rhythm no murmurs rubs or gallops.  No JVD.  No pedal edema. Gastrointestinal system: Abdomen is soft, nontender, nondistended, positive bowel sounds.  Central nervous system: Sleeping however easily arousable.  Moving extremities spontaneously.  No focal deficits.  Extremities: Symmetric 5 x 5 power. Skin: No rashes, lesions or ulcers Psychiatry: Judgement and insight appear normal. Mood & affect appropriate.     Data Reviewed: I have personally reviewed following labs and imaging studies  CBC: Recent Labs  Lab 05/04/17 1140 05/05/17 0643 05/07/17 0607 05/08/17 0609  WBC 12.4* 11.5* 16.3* 13.1*  NEUTROABS 9.3*  --   --  9.4*  HGB  --  8.6* 8.9* 8.4*  HCT 28.6* 27.8* 28.1* 26.5*  MCV 86.3 88.8 88.4 89.5  PLT 184 167 178 546   Basic Metabolic Panel: Recent Labs  Lab 05/04/17 1140 05/05/17 0643 05/07/17 0607 05/08/17 0609  NA 134* 137 132* 134*  K 4.2 4.1 3.6 4.1  CL 96* 100* 96* 97*  CO2 30* '31 24 26  ' GLUCOSE 102 94 135* 101*  BUN 5* 5* 5* 7  CREATININE 0.61 0.47 0.59 0.54  0.56  CALCIUM 10.4 9.6 9.9 9.7  MG  --   --  1.5* 2.0   GFR: Estimated Creatinine Clearance: 90.5 mL/min (by C-G formula based on SCr of 0.56 mg/dL). Liver Function Tests: Recent Labs  Lab 05/04/17 1140 05/05/17 0643 05/07/17 0607  AST 41* 44* 41  ALT '24 26 25  ' ALKPHOS 255* 225* 250*  BILITOT 0.9 0.8 1.2  PROT 6.4 6.0* 6.2*  ALBUMIN 2.4* 2.4* 2.4*   No results for input(s): LIPASE, AMYLASE in the last 168 hours. Recent Labs  Lab 05/04/17 1240  AMMONIA 21   Coagulation Profile: Recent Labs  Lab 05/08/17 0609  INR 1.85   Cardiac Enzymes: No  results for input(s): CKTOTAL, CKMB, CKMBINDEX, TROPONINI in the last 168 hours. BNP (last 3 results) No results for input(s): PROBNP in the last 8760 hours. HbA1C: No results for input(s): HGBA1C in the last 72 hours. CBG: Recent Labs  Lab 05/07/17 0749 05/07/17 1211 05/07/17 1559 05/07/17 2141 05/08/17 0807  GLUCAP 132* 125* 181* 170* 85   Lipid Profile: No results for input(s): CHOL, HDL, LDLCALC, TRIG, CHOLHDL, LDLDIRECT in the last 72 hours. Thyroid Function Tests: No results for input(s): TSH, T4TOTAL, FREET4, T3FREE, THYROIDAB in the last 72 hours. Anemia Panel: No results for input(s): VITAMINB12, FOLATE, FERRITIN, TIBC, IRON, RETICCTPCT in the last 72 hours. Sepsis Labs: No results for input(s): PROCALCITON, LATICACIDVEN in the last 168 hours.  Recent Results (from the past 240 hour(s))  Culture, blood (Routine X 2) w Reflex to ID Panel     Status: None   Collection Time: 04/28/17  9:13 PM  Result Value Ref Range Status   Specimen Description   Final  BLOOD RIGHT ANTECUBITAL Performed at [facility] [address] [place], [place] [address]    Special Requests   Final    BOTTLES DRAWN AEROBIC AND ANAEROBIC Blood Culture adequate volume Performed at [facility] [address], [place], [place] [address]    Culture   Final    NO GROWTH 5 DAYS Performed at [facility], [place] [address] [place], [place] [address]    Report Status 05/04/2017 FINAL  Final  Culture, blood (Routine X 2) w Reflex to ID Panel     Status: None   Collection Time: 04/28/17  9:31 PM  Result Value Ref Range Status   Specimen Description   Final    BLOOD RIGHT ANTECUBITAL Performed at [facility] [address], [place], [place] [address]    Special Requests   Final    BOTTLES DRAWN AEROBIC AND ANAEROBIC Blood Culture adequate volume Performed at [facility] [address] [place], [place]  [address]    Culture   Final    NO GROWTH 5 DAYS Performed at [facility], [place] [address] [place], [place] [address]    Report Status 05/04/2017 FINAL  Final  Culture, Blood     Status: None (Preliminary result)   Collection Time: 05/04/17 11:40 AM  Result Value Ref Range Status   Specimen Description BLOOD PORTA CATH  Final   Special Requests   Final    BOTTLES DRAWN AEROBIC AND ANAEROBIC Blood Culture adequate volume   Culture   Final    NO GROWTH 4 DAYS Performed at [facility], [place] [address] [place], [place] [address]    Report Status PENDING  Incomplete  Urine Culture     Status: Abnormal   Collection Time: 05/04/17 12:16 PM  Result Value Ref Range Status   Specimen Description   Final    URINE, CLEAN CATCH Performed at [facility], [place] [address], [place], [place] [address]    Special Requests   Final    NONE Performed at [facility], [place] [address], [place], [place] [address]    Culture MULTIPLE SPECIES PRESENT, SUGGEST RECOLLECTION (A)  Final   Report Status 05/05/2017 FINAL  Final         Radiology Studies: No results found.      Scheduled Meds: . aspirin EC  81 mg Oral Daily  . bisacodyl  10 mg Oral BID  . citalopram  20 mg Oral Daily  . feeding supplement (ENSURE ENLIVE)  237 mL Oral BID BM  . insulin detemir  10 Units Subcutaneous QHS  . lactulose  20 g Oral Daily  . midodrine  5 mg Oral TID WC  . morphine  30 mg Oral Q12H  . multivitamin  1 tablet Oral Daily  . oxybutynin  10 mg Oral q morning - 10a  . pantoprazole  40 mg Oral BID  . polyethylene glycol  17 g Oral BID  . predniSONE  5 mg Oral BID  . pregabalin  200 mg Oral Daily  . [START ON 05/09/2017] rivaroxaban  20 mg Oral Q breakfast  . vitamin B-12  2,500 mcg Oral Daily   Continuous Infusions: . ceFEPime (MAXIPIME) IV Stopped (05/08/17 0917)  . dextrose 5 % and 0.9% NaCl    . vancomycin 750 mg (05/08/17 0808)     LOS:  4 days    Time spent: 35 mins  Irine Seal, MD [facility] Pager [phone]  If 7PM-7AM, please contact night-coverage [email or web address] Password [facility] 05/08/2017, 10:01 AM

## 2017-05-08 NOTE — Progress Notes (Signed)
[place] with [facility] of [facility] notified of referral.

## 2017-05-08 NOTE — Telephone Encounter (Signed)
Returned call to [employer] with [facility]. Per Dr. Benay Spice he will serve as the attending for pt when she is discharged with Hospice care. Amber voiced understanding.

## 2017-05-08 NOTE — Procedures (Signed)
Interventional Radiology Procedure Note  Procedure: Port removal  Complications: None  Estimated Blood Loss: < 10 mL  Findings: Right chest port removed in entirety.  Pocket clean, not purulent. Wound closed.  Venetia Night. Kathlene Cote, M.D Pager:  [phone]

## 2017-05-08 NOTE — Progress Notes (Signed)
IP PROGRESS NOTE  Subjective:   Heather Frederick had a fever last night.  She remains confused.  She reports mild abdominal discomfort.  Objective: Vital signs in last 24 hours: Blood pressure (!) 102/49, pulse 90, temperature 97.9 F (36.6 C), temperature source Oral, resp. rate 18, height '5\' 3"'  (1.6 m), weight 244 lb (110.7 kg), SpO2 100 %.  Intake/Output from previous day: 02/11 0701 - 02/12 0700 In: 1102.5 [P.O.:360; I.V.:492.5; IV Piggyback:250] Out: -   Physical Exam:  HEENT: No thrush Lungs: Clear anteriorly Cardiac: Regular rate and rhythm Extremities: No leg edema Neurologic: Alert, follows commands  Portacath/PICC-without erythema or tenderness  Lab Results: Recent Labs    05/07/17 0607 05/08/17 0609  WBC 16.3* 13.1*  HGB 8.9* 8.4*  HCT 28.1* 26.5*  PLT 178 167    BMET Recent Labs    05/07/17 0607 05/08/17 0609  NA 132*  --   K 3.6  --   CL 96*  --   CO2 24  --   GLUCOSE 135*  --   BUN 5*  --   CREATININE 0.59 0.56  CALCIUM 9.9  --     No results found for: CEA1  Studies/Results: No results found.  Medications: I have reviewed the patient's current medications.  Assessment/Plan: 1. Metastatic pancreas cancer ? Pancreas body mass and liver metastases noted on CT abdomen/pelvis 08/11/2016 ? Ultrasound-guided biopsy of a right liver lesion 08/16/2016 revealed poorly differentiated adenocarcinoma consistent with pancreas cancer ? Foundation 1-microsatellite stable; tumor mutational burden 1; ERBB2 amplification ? Cycle 1 gemcitabine/Abraxane 09/13/2016; 09/29/2016 ? Cycle 2 gemcitabine/Abraxane 10/11/2016, 10/18/2016 ? Cycle 3 gemcitabine/Abraxane 11/01/2016, 11/08/2016 ? Cycle 4 gemcitabine/Abraxane 11/23/2016,11/29/2016 ? CT chest 12/06/2016-liver lesions appear smaller ? Cycle 5 gemcitabine/Abraxane 12/13/2016 ? CT abdomen/pelvis 01/01/2017-new and enlarging hepatic masses. Enlarging pancreatic mass. ? Cycle 6 gemcitabine/Abraxane  01/03/2017 ? Rising CA 19-9, increased pain October 2018 ? Cycle 1 FOLFOX 01/31/2017 ? Cycle 2 FOLFOX 02/19/2017 ? Cycle 3FOLFIRINOX 03/06/2017 ? CT 03/11/2017 (compared to 01/01/2017) increased liver lesions and enlargement of the pancreas mass ? Cycle4FOLFIRINOX 03/21/2017 (oxaliplatin dose reduced and Neulasta added) ? Cycle 5 FOLFIRINOX 04/09/2017 ? CT 04/13/2017-decrease in the size of the pancreas mass, no change in multiple liver lesions  2. Pain secondary to pancreas cancer, MS Contin added 03/13/2017  3. Hypertension  4. Sleep apnea  5. Chronic low back pain  6. Recurrent urinary tract infections  7. Depression  8. Migraine headaches  9. Port-A-Cath placement 08/28/2016  10. Rash following cycle 1 gemcitabine/Abraxane-drug rash?  11. Nausea/vomiting following cycle 1 gemcitabine/Abraxane-antiemetic regimen adjusted with addition of Aloxi  12. Diabetes  13. Right cephalic vein thrombosis 20/25/4270-[id number] with Xarelto  14. CT chest 12/06/2016 done to evaluate dyspnea-several 3-4 mm nodular opacities in the lung parenchyma etiology uncertain. Known mass in the body of the pancreas measuring 3.3 x 2.4 cm;small enhancing lesion in the anterior dome of the liver measures 8 mm.  15. Right forearm rash following cycle 5 day 8 gemcitabine/Abraxane. Resolved  16.Klebsiella urinary tract infection 03/14/2017  17.Admission 04/13/2017 with diaphoresis and tachycardia-resolved, 1 blood culture positive for a coagulase-negative Staphylococcus  18. Admission 04/19/2017 with recurrent hypotension  19. Admission 04/24/2017 with recurrent hypotension and diaphoresis  20.Admission 04/28/2017 with diaphoresis, fever, and weakness  21.  Admission 05/04/2017 with altered mental status and fever   Heather Frederick has intermittent fever, chiefly in the evening.  I suspect she has "tumor fever ".  Cultures remain negative.  We have requested removal  of the  Port-A-Cath in case this is a source for infection.  The etiology of her confusion remains unclear.  She likely has delirium related to polypharmacy, fever, and chronic illness.  I had further discussions with Heather Frederick and her husband.  She agrees to home hospice care.  We discussed CPR and ACLS issues.  She will be placed on a no CODE BLUE status.  Recommendations: 1.  Remove Port-A-Cath 2.  Continue MS Contin and oxycodone for pain 3.  [facility] hospice referral for home care 4.  Trial of low-dose prednisone for tumor fever (she reports allergies to naproxen and ibuprofen) 5.  Consider stopping antibiotics and midodrine         LOS: 4 days   Betsy Coder, MD   05/08/2017, 8:29 AM

## 2017-05-09 ENCOUNTER — Encounter (HOSPITAL_COMMUNITY): Payer: Self-pay

## 2017-05-09 DIAGNOSIS — G893 Neoplasm related pain (acute) (chronic): Secondary | ICD-10-CM

## 2017-05-09 DIAGNOSIS — R5081 Fever presenting with conditions classified elsewhere: Secondary | ICD-10-CM

## 2017-05-09 DIAGNOSIS — F419 Anxiety disorder, unspecified: Secondary | ICD-10-CM

## 2017-05-09 DIAGNOSIS — F329 Major depressive disorder, single episode, unspecified: Secondary | ICD-10-CM

## 2017-05-09 DIAGNOSIS — C259 Malignant neoplasm of pancreas, unspecified: Secondary | ICD-10-CM

## 2017-05-09 DIAGNOSIS — C787 Secondary malignant neoplasm of liver and intrahepatic bile duct: Secondary | ICD-10-CM

## 2017-05-09 LAB — BASIC METABOLIC PANEL
ANION GAP: 12 (ref 5–15)
BUN: 8 mg/dL (ref 6–20)
CHLORIDE: 99 mmol/L — AB (ref 101–111)
CO2: 26 mmol/L (ref 22–32)
CREATININE: 0.58 mg/dL (ref 0.44–1.00)
Calcium: 11 mg/dL — ABNORMAL HIGH (ref 8.9–10.3)
GFR calc non Af Amer: >60 mL/min (ref >60)
GLUCOSE: 128 mg/dL — AB (ref 65–99)
Potassium: 4.1 mmol/L (ref 3.5–5.1)
Sodium: 137 mmol/L (ref 135–145)

## 2017-05-09 LAB — CULTURE, BLOOD (SINGLE)
CULTURE: NO GROWTH
SPECIAL REQUESTS: ADEQUATE

## 2017-05-09 LAB — CBC WITH DIFFERENTIAL/PLATELET
BASOS PCT: 0 %
Basophils Absolute: 0 10*3/uL (ref 0.0–0.1)
Eosinophils Absolute: 0.1 10*3/uL (ref 0.0–0.7)
Eosinophils Relative: 1 %
HEMATOCRIT: 27.2 % — AB (ref 36.0–46.0)
HEMOGLOBIN: 8.5 g/dL — AB (ref 12.0–15.0)
LYMPHS PCT: 9 %
Lymphs Abs: 1.1 10*3/uL (ref 0.7–4.0)
MCH: 28.1 pg (ref 26.0–34.0)
MCHC: 31.3 g/dL (ref 30.0–36.0)
MCV: 90.1 fL (ref 78.0–100.0)
MONOS PCT: 11 %
Monocytes Absolute: 1.3 10*3/uL — ABNORMAL HIGH (ref 0.1–1.0)
NEUTROS ABS: 9.4 10*3/uL — AB (ref 1.7–7.7)
NEUTROS PCT: 79 %
Platelets: 175 10*3/uL (ref 150–400)
RBC: 3.02 MIL/uL — ABNORMAL LOW (ref 3.87–5.11)
RDW: 18.8 % — ABNORMAL HIGH (ref 11.5–15.5)
WBC: 11.9 10*3/uL — ABNORMAL HIGH (ref 4.0–10.5)

## 2017-05-09 MED ORDER — BISACODYL 5 MG PO TBEC: 10.0000 mg | DELAYED_RELEASE_TABLET | Freq: Two times a day (BID) | ORAL | Status: DC | PRN

## 2017-05-09 MED ORDER — SENNOSIDES-DOCUSATE SODIUM 8.6-50 MG PO TABS: 1.0000 | ORAL_TABLET | Freq: Every day | ORAL | Status: DC

## 2017-05-09 MED ORDER — SENNOSIDES-DOCUSATE SODIUM 8.6-50 MG PO TABS: 2.0000 | ORAL_TABLET | Freq: Every day | ORAL | 0 refills | Status: DC

## 2017-05-09 MED ORDER — ENSURE ENLIVE PO LIQD: 237.0000 mL | Freq: Two times a day (BID) | ORAL | 0 refills | Status: AC

## 2017-05-09 MED ORDER — ONDANSETRON HCL 4 MG PO TABS: 4.0000 mg | ORAL_TABLET | Freq: Four times a day (QID) | ORAL | 0 refills | Status: AC | PRN

## 2017-05-09 MED ORDER — BISACODYL 5 MG PO TBEC: 10.0000 mg | DELAYED_RELEASE_TABLET | Freq: Every day | ORAL | 0 refills | Status: AC | PRN

## 2017-05-09 MED ORDER — LACTULOSE 10 GM/15ML PO SOLN: 20.0000 g | Freq: Every day | ORAL | 0 refills | Status: AC | PRN

## 2017-05-09 MED ORDER — OXYCODONE HCL 10 MG PO TABS: 10.0000 mg | ORAL_TABLET | Freq: Four times a day (QID) | ORAL | 0 refills | Status: DC | PRN

## 2017-05-09 MED ORDER — POLYETHYLENE GLYCOL 3350 17 G PO PACK: 17.0000 g | PACK | Freq: Two times a day (BID) | ORAL | 0 refills | Status: DC

## 2017-05-09 MED ORDER — LACTULOSE 10 GM/15ML PO SOLN: 20.0000 g | Freq: Every day | ORAL | Status: DC | PRN

## 2017-05-09 MED ORDER — PREDNISONE 5 MG PO TABS: 5.0000 mg | ORAL_TABLET | Freq: Two times a day (BID) | ORAL | 0 refills | Status: DC

## 2017-05-09 NOTE — Discharge Summary (Signed)
PATIENT DETAILS Name: Heather Frederick Age: 62 y.o. Sex: female Date of Birth: 21-Apr-1955 MRN: [id number]. Admitting Physician: Patrecia Pour, MD PCP:Kim, Jeneen Rinks, MD  Admit Date: 05/04/2017 Discharge date: 05/09/2017  Recommendations for Outpatient Follow-up:  1. 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  Evaluated by oncology-felt to have very poor underlying prognosis-and subsequently recommendations were to discharge home with hospice care.  See below for further details.    Brief Hospital Course: Fever: Thought to be secondary to tumor fever-no source apparent-all cultures negative.  Evaluated by oncology-recommendations are to continue prednisone-and discharge home with hospice care.  She did have a Port-A-Cath in place, but this was removed by interventional radiology on 2/12.  Metastatic pancreatic cancer: As noted above-evaluated by oncology-with recommendations to discharge home with hospice care.  Chronic pain syndrome: Secondary to underlying tumor-recommendations are to continue with MS Contin and oxycodone for breakthrough pain.  Has been placed on a bowel regimen.  Extensive discussion with patient and family at bedside this morning-they will use either lactulose or Dulcolax if patient still does not have a bowel movement with MiraLAX and Senokot.  Depression with anxiety: Continue regimen of Celexa and Ativan.  Venous thromboembolism: We will go ahead and discontinue Xarelto as recommended by oncology.  DM-2:  Continue with usual insulin regimen.  History of orthostatic hypotension: As recommended by oncology-we will discontinue midodrine.  Family aware that if patient starts having symptomatic orthostatic hypotension-we could potentially resume Midodrine in the near future.  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)   Anemia   Thrombocytopenia (HCC)   Chronic pain   Anxiety and depression   UTI (urinary tract infection)   Altered mental status   Type 2 diabetes mellitus with complication [facility])   Discharge Instructions:  Activity:  As tolerated with Full fall precautions use walker/cane & assistance as needed   Discharge Instructions    Diet general   Complete by:  As directed    Discharge instructions   Complete by:  As directed    Follow with hospice and other physicians as needed  Get Medicines reviewed and adjusted: Please take all your medications with you for your next visit with your Primary MD  Laboratory/radiological data: Please request your Primary MD to go over all hospital tests and procedure/radiological results at the follow up, please ask your Primary MD to get all Hospital records sent to his/her office.  In some cases, they will be blood work, cultures and biopsy results pending at the time of your discharge. Please request that your primary care M.D. follows up on these results.  Also Note the following: If you experience worsening of your admission symptoms, develop shortness of breath, life threatening emergency, suicidal or homicidal thoughts you must seek medical attention immediately by calling 911 or calling your MD immediately  if symptoms less severe.  You must read  complete instructions/literature along with all the possible adverse  reactions/side effects for all the Medicines you take and that have been prescribed to you. Take any new Medicines after you have completely understood and accpet all the possible adverse reactions/side effects.   Do not drive when taking Pain medications or sleeping medications (Benzodaizepines)  Do not take more than prescribed Pain, Sleep and Anxiety Medications. It is not advisable to combine anxiety,sleep and pain medications without talking with your primary care practitioner  Special Instructions: If you have smoked or chewed Tobacco  in the last 2 yrs please stop smoking, stop any regular Alcohol  and or any Recreational drug use.  Wear Seat belts while driving.  Please note: You were cared for by a hospitalist during your hospital stay. Once you are discharged, your primary care physician will handle any further medical issues. Please note that NO REFILLS for any discharge medications will be authorized once you are discharged, as it is imperative that you return to your primary care physician (or establish a relationship with a primary care physician if you do not have one) for your post hospital discharge needs so that they can reassess your need for medications and monitor your lab values.   Increase activity slowly   Complete by:  As directed      Allergies as of 05/09/2017      Reactions   Topamax [topiramate] Other (See Comments)   Stroke like symptoms   Aleve [naproxen Sodium] Hives   Has tolerated Voltaren topical as well as aspirin.   Bee Venom Swelling   Echinacea Hives   Other Other (See Comments)   Feathers cause sinus congestion   Sulfa Antibiotics Hives   Advil [ibuprofen] Hives   Has tolerated Voltaren topical as well as aspirin.      Medication List    STOP taking these medications   aspirin EC 81 MG tablet   HYDROmorphone 4 MG tablet Commonly known as:  DILAUDID   lidocaine-prilocaine cream Commonly known as:  EMLA   midodrine 5 MG tablet Commonly  known as:  PROAMATINE   QC MAGNESIUM CITRATE Soln Generic drug:  magnesium citrate   sorbitol 70 % solution   XARELTO 20 MG Tabs tablet Generic drug:  rivaroxaban     TAKE these medications   acetaminophen 325 MG tablet Commonly known as:  TYLENOL Take 2 tablets (650 mg total) by mouth 2 (two) times daily at 10 AM and 5 PM.   bisacodyl 5 MG EC tablet Commonly known as:  DULCOLAX Take 2 tablets (10 mg total) by mouth daily as needed for moderate constipation. What changed:    when to take this  reasons to take this   citalopram 20 MG tablet Commonly known as:  CELEXA TAKE ONE (1) TABLET BY MOUTH EVERY DAY What changed:  See the new instructions.   CRANBERRY PLUS VITAMIN C PO Take 1 tablet by mouth daily. 4200 mg   cyclobenzaprine 5 MG tablet Commonly known as:  FLEXERIL TAKE ONE (1) TABLET BY MOUTH 3 TIMES DAILY AS NEEDED FOR MUSCLE SPASMS What changed:  See the new instructions.   eletriptan 40 MG tablet Commonly known as:  RELPAX Take 1 tablet (40 mg total) by mouth every 2 (two) hours as needed for migraine.   feeding supplement (ENSURE ENLIVE) Liqd Take 237 mLs by mouth 2 (two) times daily between meals.   insulin detemir 100 unit/ml Soln Commonly known as:  LEVEMIR Inject 10 Units into the skin at  bedtime.   insulin lispro 100 UNIT/ML injection Commonly known as:  HUMALOG Inject 2-10 Units into the skin. Sliding Scale   lactulose 10 GM/15ML solution Commonly known as:  CHRONULAC Take 30 mLs (20 g total) by mouth daily as needed for mild constipation. What changed:    when to take this  reasons to take this   LORazepam 1 MG tablet Commonly known as:  ATIVAN Take 1 tablet (1 mg total) by mouth every 8 (eight) hours as needed for anxiety or sedation (or nausea).   LYRICA 200 MG capsule Generic drug:  pregabalin Take 200 mg by mouth daily.   morphine 30 MG 12 hr tablet Commonly known as:  MS CONTIN Take 1 tablet (30 mg total) by mouth every 12  (twelve) hours.   multivitamin-lutein Caps capsule Take 1 capsule by mouth daily.   nitroGLYCERIN 0.4 MG SL tablet Commonly known as:  NITROSTAT Place 1 tablet (0.4 mg total) under the tongue every 5 (five) minutes as needed for chest pain.   ondansetron 4 MG tablet Commonly known as:  ZOFRAN Take 1 tablet (4 mg total) by mouth every 6 (six) hours as needed for nausea.   oxybutynin 10 MG 24 hr tablet Commonly known as:  DITROPAN-XL Take 1 tablet (10 mg total) by mouth every morning.   Oxycodone HCl 10 MG Tabs Take 1 tablet (10 mg total) by mouth every 6 (six) hours as needed for severe pain or breakthrough pain. What changed:    medication strength  how much to take  reasons to take this   pantoprazole 40 MG tablet Commonly known as:  PROTONIX TAKE ONE (1) TABLET BY MOUTH TWO (2) TIMES DAILY What changed:  See the new instructions.   polyethylene glycol packet Commonly known as:  MIRALAX / GLYCOLAX Take 17 g by mouth 2 (two) times daily.   predniSONE 5 MG tablet Commonly known as:  DELTASONE Take 1 tablet (5 mg total) by mouth 2 (two) times daily.   prochlorperazine 10 MG tablet Commonly known as:  COMPAZINE Take 10 mg by mouth every 6 (six) hours as needed for nausea or vomiting.   senna-docusate 8.6-50 MG tablet Commonly known as:  Senokot-S Take 2 tablets by mouth at bedtime.   simethicone 125 MG chewable tablet Commonly known as:  MYLICON Chew 591-638 mg by mouth 2 (two) times daily as needed for flatulence.   vitamin B-12 1000 MCG tablet Commonly known as:  CYANOCOBALAMIN Take 2,500 mcg by mouth every morning.      Follow-up Information    [name], MD. Schedule an appointment as soon as possible for a visit.   Specialty:  Internal Medicine Why:  as needed Contact information: [address] [place] [address] [phone]          Allergies  Allergen Reactions  . Topamax [Topiramate] Other (See Comments)    Stroke  like symptoms  . Aleve [Naproxen Sodium] Hives    Has tolerated Voltaren topical as well as aspirin.  . Bee Venom Swelling  . Echinacea Hives  . Other Other (See Comments)    Feathers cause sinus congestion  . Sulfa Antibiotics Hives  . Advil [Ibuprofen] Hives    Has tolerated Voltaren topical as well as aspirin.    Consultations:   hematology/oncology  Interventional radiology   Other Procedures/Studies: Dg Chest 2 View  Result Date: 04/24/2017 CLINICAL DATA:  Shortness of breath and abdominal pain today. Chills. EXAM: CHEST  2 VIEW COMPARISON:  04/20/2017 FINDINGS: Power port type central venous catheter with tip in the mid SVC region. No pneumothorax. Shallow inspiration with elevation of the hemidiaphragms. Cardiac enlargement with mild vascular congestion. Diffuse interstitial pattern to the lungs is increasing since previous study suggesting increasing edema. Linear atelectasis in the lung bases. No pneumothorax. Mediastinal contours appear intact. IMPRESSION: Shallow inspiration with elevation of the hemidiaphragms and atelectasis in the lung bases. Cardiac enlargement with pulmonary vascular congestion and increasing interstitial edema since previous study. Electronically Signed   By: Lucienne Capers M.D.   On: 04/24/2017 23:50   Dg Chest 2 View  Result Date: 04/20/2017 CLINICAL DATA:  History of metastatic pancreatic cancer. Low blood pressure. EXAM: CHEST  2 VIEW COMPARISON:  04/19/2017 FINDINGS: Stable right-sided injectable port. Cardiomediastinal silhouette is normal. Mediastinal contours appear intact. There is no evidence of focal airspace consolidation, pleural effusion or pneumothorax. Chronic elevation of the hemidiaphragms with atelectatic changes at the lung bases. Osseous structures are without acute abnormality. Soft tissues are grossly normal. IMPRESSION: Low lung volume with chronic elevation of the hemidiaphragms and atelectatic changes at the lung bases.  Electronically Signed   By: Fidela Salisbury M.D.   On: 04/20/2017 12:17   Dg Chest 2 View  Result Date: 04/19/2017 CLINICAL DATA:  Hypotension and history of pancreatic carcinoma EXAM: CHEST  2 VIEW COMPARISON:  04/13/2017 FINDINGS: Right-sided chest wall port is again seen. Elevation the right hemidiaphragm is again noted. The lungs are well aerated bilaterally. Minimal right basilar atelectasis is again seen. No new focal abnormality is noted. IMPRESSION: Stable right basilar atelectasis. Electronically Signed   By: Inez Catalina M.D.   On: 04/19/2017 20:49   Dg Chest 2 View  Result Date: 04/13/2017 CLINICAL DATA:  Shortness of breath. EXAM: CHEST  2 VIEW COMPARISON:  Radiograph of February 23, 2017. FINDINGS: Stable cardiomediastinal silhouette. No pneumothorax is noted. Elevated right hemidiaphragm is noted with mild right basilar subsegmental atelectasis. Right internal jugular Port-A-Cath is noted with distal tip in expected position of the SVC. Left lung is clear. No significant pleural effusion is noted. Bony thorax is unremarkable. IMPRESSION: Elevated right hemidiaphragm with mild right basilar subsegmental atelectasis. No other significant abnormality is noted. Electronically Signed   By: Marijo Conception, M.D.   On: 04/13/2017 14:56   Dg Abd 1 View  Result Date: 05/01/2017 CLINICAL DATA:  Nausea. History of metastatic pancreatic malignancy. EXAM: ABDOMEN - 1 VIEW COMPARISON:  Abdominal radiographs of May 27, 2017 FINDINGS: The colonic stool and gas pattern is similar to that seen yesterday. There is no evidence of obstruction or significant ileus. There is no significant rectal gas. IMPRESSION: No evidence of bowel obstruction.  Moderate colonic stool burden. Electronically Signed   By: David  Martinique M.D.   On: 05/01/2017 07:15   Dg Abd 1 View  Result Date: 04/29/2017 CLINICAL DATA:  Constipation for the past 2-3 days. History of pancreatic cancer. EXAM: ABDOMEN - 1 VIEW COMPARISON:   04/28/2017; 04/25/2017; CT abdomen and pelvis - 04/28/2017 FINDINGS: Moderate colonic stool burden without evidence of enteric obstruction. Nondiagnostic evaluation for pneumoperitoneum secondary to supine patient positioning and exclusion of the lower thorax. Surgical mesh overlies the right lower abdomen. Post cholecystectomy. No acute osseus abnormalities. Stigmata of DISH within the lower thoracic spine. IMPRESSION: Moderate colonic stool burden without evidence of enteric obstruction. Electronically Signed   By: Sandi Mariscal M.D.   On: 04/29/2017 10:58   Ct Angio Chest Pe W And/or Wo Contrast  Result Date:  04/28/2017 CLINICAL DATA:  Weakness and shortness of breath. History of pancreatic cancer with metastatic disease. EXAM: CT ANGIOGRAPHY CHEST WITH CONTRAST TECHNIQUE: Multidetector CT imaging of the chest was performed using the standard protocol during bolus administration of intravenous contrast. Multiplanar CT image reconstructions and MIPs were obtained to evaluate the vascular anatomy. CONTRAST:  100 mL ISOVUE-370 IOPAMIDOL (ISOVUE-370) INJECTION 76% COMPARISON:  04/13/2017 CT chest. FINDINGS: Cardiovascular: Satisfactory opacification of the pulmonary arteries to the segmental level. No evidence of pulmonary embolism. Normal heart size. No pericardial effusion. Mediastinum/Nodes: No enlarged mediastinal, hilar, or axillary lymph nodes. Thyroid gland, trachea, and esophagus demonstrate no significant findings. Port-A-Cath good position. Lungs/Pleura: Unchanged 6 mm RIGHT upper lobe nodule. Upper Abdomen: Hepatic metastatic disease appears similar to priors. Pancreatic head and body mass redemonstrated. Musculoskeletal: No chest wall abnormality. No acute or significant osseous findings. Review of the MIP images confirms the above findings. IMPRESSION: No evidence for pulmonary emboli. No acute cardiopulmonary findings. Hepatic metastatic disease from pancreatic cancer. Study is insufficient  characterize for improvement or progression. Electronically Signed   By: Staci Righter M.D.   On: 04/28/2017 15:51   Ct Angio Chest Pe W/cm &/or Wo Cm  Result Date: 04/13/2017 CLINICAL DATA:  Pancreatic carcinoma. Referral cancer center for hypotension and malaise. Chills and nausea and vomiting. Chronic RIGHT-sided abdominal pain. EXAM: CT ANGIOGRAPHY CHEST CT ABDOMEN AND PELVIS WITH CONTRAST TECHNIQUE: Multidetector CT imaging of the chest was performed using the standard protocol during bolus administration of intravenous contrast. Multiplanar CT image reconstructions and MIPs were obtained to evaluate the vascular anatomy. Multidetector CT imaging of the abdomen and pelvis was performed using the standard protocol during bolus administration of intravenous contrast. CONTRAST:  17mL ISOVUE-370 IOPAMIDOL (ISOVUE-370) INJECTION 76% COMPARISON:  CT chest abdomen pelvis 03/11/2017 FINDINGS: CTA CHEST FINDINGS Cardiovascular: No filling defects within the pulmonary arteries arteries to suggest acute pulmonary embolism. No significant vascular findings. Normal heart size. No pericardial effusion. Mediastinum/Nodes: No axillary supraclavicular adenopathy. Port in the RIGHT chest wall with tip in distal SVC. No mediastinal hilar adenopathy. No pericardial fluid. Esophagus normal. Lungs/Pleura: 6 mm RIGHT upper lobe pulmonary nodule is not changed compared to 03/11/2017. Lesions increased in size from 12/06/2016 no new pulmonary nodules. Musculoskeletal: No aggressive osseous lesion Review of the MIP images confirms the above findings. CT ABDOMEN and PELVIS FINDINGS Hepatobiliary: Multiple round low-density lesions in liver parenchyma consistent with hepatic metastasis are not changed in short interval from 03/11/2017. No new lesions. Postcholecystectomy. Pancreas: Lesion head of pancreas measures smaller at 3.7 by 4.9 cm compared to 4.5 x 5.2 cm. There is atrophy duct dilatation the body and tail the pancreas.  Spleen: Geographic low attenuation within the medial aspect the spleen is likely a profusion phenomena (image 34, series 4). Adrenals/urinary tract: Adrenal glands and kidneys are normal. The ureters and bladder normal. Stomach/Bowel: Stomach, small bowel, appendix, and cecum are normal. The colon and rectosigmoid colon are normal. Vascular/Lymphatic: Abdominal aorta is normal caliber. There is no retroperitoneal or periportal lymphadenopathy. No pelvic lymphadenopathy. Reproductive: Post hysterectomy. Other: No peritoneal nodularity Musculoskeletal: No aggressive osseous lesion. Review of the MIP images confirms the above findings. IMPRESSION: Chest Impression: 1. No evidence acute pulmonary embolism. 2. Suspicious RIGHT upper lobe pulmonary nodule not changed in short interval from 03/11/2017. Abdomen / Pelvis Impression: 1. Interval decrease in size of pancreatic mass in short interval follow-up. 2. Stable bilobed hepatic metastasis. 3. No peritoneal metastatic disease or progressive adenopathy. Electronically Signed   By: Nicole Kindred  Leonia Reeves M.D.   On: 04/13/2017 17:55   Mr Jeri Cos TK Contrast  Result Date: 04/30/2017 CLINICAL DATA:  62 y/o F; metastatic pancreatic cancer presenting with nausea, lightheadedness, fatigue, and shortness of breath. EXAM: MRI HEAD WITHOUT AND WITH CONTRAST TECHNIQUE: Multiplanar, multiecho pulse sequences of the brain and surrounding structures were obtained without and with intravenous contrast. CONTRAST:  55mL MULTIHANCE GADOBENATE DIMEGLUMINE 529 MG/ML IV SOLN COMPARISON:  02/05/2017 MRI of the head. FINDINGS: Brain: Moderate motion artifact degrades multiple sequences. No acute infarction, hemorrhage, hydrocephalus, extra-axial collection or mass lesion. Cavum septum pellucidum. After administration of intravenous contrast there is NO abnormal enhancement of the brain Vascular: Normal flow voids. Skull and upper cervical spine: Normal marrow signal. Sinuses/Orbits: Negative.  Other: None. IMPRESSION: Stable normal for age MRI of the brain.  Moderate motion artifact. Electronically Signed   By: Kristine Garbe M.D.   On: 04/30/2017 19:22   Ct Abdomen Pelvis W Contrast  Result Date: 04/28/2017 CLINICAL DATA:  Progressive abdominal pain with history of pancreatic cancer. EXAM: CT ABDOMEN AND PELVIS WITH CONTRAST TECHNIQUE: Multidetector CT imaging of the abdomen and pelvis was performed using the standard protocol following bolus administration of intravenous contrast. CONTRAST:  80 cc Isovue 300 intravenously. COMPARISON:  Abdominal CT 04/13/2017 FINDINGS: Lower chest: Atelectatic changes in the right lung base. Partially visualized 6 mm right upper lobe pulmonary nodule. Hepatobiliary: No significant change in numerous bilateral hepatic metastatic lesions. Pancreas: Decreased in size hypoattenuated body of the pancreas malignancy, now measuring approximately 4.0 by 2.5 cm. Persistent dilation of the downstream main pancreatic duct and atrophy of the tail of the pancreas. Persistent peripancreatic fat stranding. Small peripancreatic lymph nodes appear stable. Spleen: Normal in size without focal abnormality. Adrenals/Urinary Tract: Adrenal glands are unremarkable. Kidneys are normal, without renal calculi, focal lesion, or hydronephrosis. Bladder is unremarkable. Stomach/Bowel: Stomach is within normal limits. Appendix appears normal. No evidence of bowel wall thickening, distention, or inflammatory changes. Vascular/Lymphatic: Mild upper abdominal retroperitoneal lymphadenopathy. No significant vascular findings. Reproductive: Status post hysterectomy. No adnexal masses. Other: No abdominal wall hernia or abnormality. No abdominopelvic ascites. Prior ventral hernia repair. Musculoskeletal: No acute or significant osseous findings. IMPRESSION: Suboptimal timing of contrast administration. Interval mild decrease in size of pancreatic body malignancy. Mild upper abdominal  retroperitoneal lymphadenopathy. No significant change in widespread hepatic metastatic disease. Electronically Signed   By: Fidela Salisbury M.D.   On: 04/28/2017 17:39   Ct Abdomen Pelvis W Contrast  Result Date: 04/13/2017 CLINICAL DATA:  Pancreatic carcinoma. Referral cancer center for hypotension and malaise. Chills and nausea and vomiting. Chronic RIGHT-sided abdominal pain. EXAM: CT ANGIOGRAPHY CHEST CT ABDOMEN AND PELVIS WITH CONTRAST TECHNIQUE: Multidetector CT imaging of the chest was performed using the standard protocol during bolus administration of intravenous contrast. Multiplanar CT image reconstructions and MIPs were obtained to evaluate the vascular anatomy. Multidetector CT imaging of the abdomen and pelvis was performed using the standard protocol during bolus administration of intravenous contrast. CONTRAST:  171mL ISOVUE-370 IOPAMIDOL (ISOVUE-370) INJECTION 76% COMPARISON:  CT chest abdomen pelvis 03/11/2017 FINDINGS: CTA CHEST FINDINGS Cardiovascular: No filling defects within the pulmonary arteries arteries to suggest acute pulmonary embolism. No significant vascular findings. Normal heart size. No pericardial effusion. Mediastinum/Nodes: No axillary supraclavicular adenopathy. Port in the RIGHT chest wall with tip in distal SVC. No mediastinal hilar adenopathy. No pericardial fluid. Esophagus normal. Lungs/Pleura: 6 mm RIGHT upper lobe pulmonary nodule is not changed compared to 03/11/2017. Lesions increased in size from 12/06/2016 no new  pulmonary nodules. Musculoskeletal: No aggressive osseous lesion Review of the MIP images confirms the above findings. CT ABDOMEN and PELVIS FINDINGS Hepatobiliary: Multiple round low-density lesions in liver parenchyma consistent with hepatic metastasis are not changed in short interval from 03/11/2017. No new lesions. Postcholecystectomy. Pancreas: Lesion head of pancreas measures smaller at 3.7 by 4.9 cm compared to 4.5 x 5.2 cm. There is atrophy  duct dilatation the body and tail the pancreas. Spleen: Geographic low attenuation within the medial aspect the spleen is likely a profusion phenomena (image 34, series 4). Adrenals/urinary tract: Adrenal glands and kidneys are normal. The ureters and bladder normal. Stomach/Bowel: Stomach, small bowel, appendix, and cecum are normal. The colon and rectosigmoid colon are normal. Vascular/Lymphatic: Abdominal aorta is normal caliber. There is no retroperitoneal or periportal lymphadenopathy. No pelvic lymphadenopathy. Reproductive: Post hysterectomy. Other: No peritoneal nodularity Musculoskeletal: No aggressive osseous lesion. Review of the MIP images confirms the above findings. IMPRESSION: Chest Impression: 1. No evidence acute pulmonary embolism. 2. Suspicious RIGHT upper lobe pulmonary nodule not changed in short interval from 03/11/2017. Abdomen / Pelvis Impression: 1. Interval decrease in size of pancreatic mass in short interval follow-up. 2. Stable bilobed hepatic metastasis. 3. No peritoneal metastatic disease or progressive adenopathy. Electronically Signed   By: Suzy Bouchard M.D.   On: 04/13/2017 17:55   Ir Removal [employer] Access W/ Port W/o Fl Mod Sed  Result Date: 05/08/2017 CLINICAL DATA:  End-stage metastatic pancreatic carcinoma and unexplained fever with possibility that indwelling port is a source bacteremia. Request has been made to remove the port. EXAM: REMOVAL OF IMPLANTED TUNNELED PORT-A-CATH MEDICATIONS: Moderate (conscious) sedation was employed during this procedure. A total of Versed 0.5 mg and Fentanyl 25 mcg was administered intravenously. Moderate Sedation Time: 20 minutes. The patient's level of consciousness and vital signs were monitored continuously by radiology nursing throughout the procedure under my direct supervision. PROCEDURE: The right chest Port-A-Cath site was prepped with chlorhexidine. A sterile gown and gloves were worn during the procedure. Local anesthesia was  provided with 1% lidocaine. An incision was made overlying the Port-A-Cath with a #15 scalpel. Utilizing sharp and blunt dissection, the Port-A-Cath was removed. Portable cautery was utilized. Retention sutures were removed. The pocked was irrigated with sterile saline. Wound closure was performed with subcutaneous 3-0 Monocryl, subcuticular 4-0 Vicryl and Dermabond. The entire Port-A-Cath was removed successfully. IMPRESSION: Removal of implanted Port-A-Cath utilizing sharp and blunt dissection. The procedure was uncomplicated. Electronically Signed   By: Aletta Edouard M.D.   On: 05/08/2017 16:03   Dg Chest Port 1v Same Day  Result Date: 05/06/2017 CLINICAL DATA:  Weakness and shortness of breath. EXAM: PORTABLE CHEST 1 VIEW COMPARISON:  04/28/2017 CT, 04/24/2017 radiographs and prior studies. FINDINGS: Cardiomegaly and right Port-A-Cath with tip overlying the mid SVC again noted. Right hemithorax volume loss and mild right basilar atelectasis/scarring again noted. There is no evidence of airspace disease, pleural effusion or pneumothorax. No acute bony abnormalities are present. IMPRESSION: Unchanged appearance of the chest with mild right basilar atelectasis/scarring. Electronically Signed   By: Margarette Canada M.D.   On: 05/06/2017 07:54   Dg Abd Portable 1v  Result Date: 04/25/2017 CLINICAL DATA:  Abdominal pain EXAM: PORTABLE ABDOMEN - 1 VIEW COMPARISON:  CT abdomen and pelvis 04/13/2017 FINDINGS: Prior ventral hernia repair in pelvis. Surgical clips RIGHT upper quadrant related to prior cholecystectomy. Nonobstructive bowel gas pattern. No bowel dilatation or bowel wall thickening. Bones demineralized. Small pelvic phleboliths without definite urinary tract calcification. IMPRESSION: Normal  bowel gas pattern. Electronically Signed   By: Lavonia Dana M.D.   On: 04/25/2017 15:37   Dg Abd Portable 2 Views  Result Date: 04/28/2017 CLINICAL DATA:  Vomiting EXAM: PORTABLE ABDOMEN - 2 VIEW COMPARISON:   04/25/2017 FINDINGS: The bowel gas pattern is normal. There is no evidence of free air. No radio-opaque calculi or other significant radiographic abnormality is seen. IMPRESSION: Negative. Electronically Signed   By: Kathreen Devoid   On: 04/28/2017 14:17      TODAY-DAY OF DISCHARGE:  Subjective:   Carman Ching today remains stable-abdominal pain and other pain seems to be adequately controlled with the current narcotic regimen.  Objective:   Blood pressure 110/66, pulse 81, temperature 98.4 F (36.9 C), temperature source Oral, resp. rate 20, height 5\' 3"  (1.6 m), weight 110.7 kg (244 lb), SpO2 95 %.  Intake/Output Summary (Last 24 hours) at 05/09/2017 1020 Last data filed at 05/09/2017 0800 Gross per 24 hour  Intake 1965 ml  Output -  Net 1965 ml   Filed Weights   05/05/17 0428 05/06/17 0529 05/07/17 0534  Weight: 112.5 kg (248 lb) 112 kg (247 lb) 110.7 kg (244 lb)    Exam: Awake Alert, mildly confused-spouse at bedside.  Answer some of my questions appropriately.  [place].AT,PERRAL Supple Neck,No JVD, No cervical lymphadenopathy appriciated.  Symmetrical Chest wall movement, Good air movement bilaterally, CTAB RRR,No Gallops,Rubs or new Murmurs, No Parasternal Heave +ve B.Sounds, Abd Soft, Non tender, No organomegaly appriciated, No rebound -guarding or rigidity. No Cyanosis, Clubbing or edema, No new Rash or bruise   PERTINENT RADIOLOGIC STUDIES: Dg Chest 2 View  Result Date: 04/24/2017 CLINICAL DATA:  Shortness of breath and abdominal pain today. Chills. EXAM: CHEST  2 VIEW COMPARISON:  04/20/2017 FINDINGS: Power port type central venous catheter with tip in the mid SVC region. No pneumothorax. Shallow inspiration with elevation of the hemidiaphragms. Cardiac enlargement with mild vascular congestion. Diffuse interstitial pattern to the lungs is increasing since previous study suggesting increasing edema. Linear atelectasis in the lung bases. No pneumothorax. Mediastinal contours  appear intact. IMPRESSION: Shallow inspiration with elevation of the hemidiaphragms and atelectasis in the lung bases. Cardiac enlargement with pulmonary vascular congestion and increasing interstitial edema since previous study. Electronically Signed   By: Lucienne Capers M.D.   On: 04/24/2017 23:50   Dg Chest 2 View  Result Date: 04/20/2017 CLINICAL DATA:  History of metastatic pancreatic cancer. Low blood pressure. EXAM: CHEST  2 VIEW COMPARISON:  04/19/2017 FINDINGS: Stable right-sided injectable port. Cardiomediastinal silhouette is normal. Mediastinal contours appear intact. There is no evidence of focal airspace consolidation, pleural effusion or pneumothorax. Chronic elevation of the hemidiaphragms with atelectatic changes at the lung bases. Osseous structures are without acute abnormality. Soft tissues are grossly normal. IMPRESSION: Low lung volume with chronic elevation of the hemidiaphragms and atelectatic changes at the lung bases. Electronically Signed   By: Fidela Salisbury M.D.   On: 04/20/2017 12:17   Dg Chest 2 View  Result Date: 04/19/2017 CLINICAL DATA:  Hypotension and history of pancreatic carcinoma EXAM: CHEST  2 VIEW COMPARISON:  04/13/2017 FINDINGS: Right-sided chest wall port is again seen. Elevation the right hemidiaphragm is again noted. The lungs are well aerated bilaterally. Minimal right basilar atelectasis is again seen. No new focal abnormality is noted. IMPRESSION: Stable right basilar atelectasis. Electronically Signed   By: Inez Catalina M.D.   On: 04/19/2017 20:49   Dg Chest 2 View  Result Date: 04/13/2017 CLINICAL DATA:  Shortness  of breath. EXAM: CHEST  2 VIEW COMPARISON:  Radiograph of February 23, 2017. FINDINGS: Stable cardiomediastinal silhouette. No pneumothorax is noted. Elevated right hemidiaphragm is noted with mild right basilar subsegmental atelectasis. Right internal jugular Port-A-Cath is noted with distal tip in expected position of the SVC. Left  lung is clear. No significant pleural effusion is noted. Bony thorax is unremarkable. IMPRESSION: Elevated right hemidiaphragm with mild right basilar subsegmental atelectasis. No other significant abnormality is noted. Electronically Signed   By: Marijo Conception, M.D.   On: 04/13/2017 14:56   Dg Abd 1 View  Result Date: 05/01/2017 CLINICAL DATA:  Nausea. History of metastatic pancreatic malignancy. EXAM: ABDOMEN - 1 VIEW COMPARISON:  Abdominal radiographs of May 27, 2017 FINDINGS: The colonic stool and gas pattern is similar to that seen yesterday. There is no evidence of obstruction or significant ileus. There is no significant rectal gas. IMPRESSION: No evidence of bowel obstruction.  Moderate colonic stool burden. Electronically Signed   By: David  Martinique M.D.   On: 05/01/2017 07:15   Dg Abd 1 View  Result Date: 04/29/2017 CLINICAL DATA:  Constipation for the past 2-3 days. History of pancreatic cancer. EXAM: ABDOMEN - 1 VIEW COMPARISON:  04/28/2017; 04/25/2017; CT abdomen and pelvis - 04/28/2017 FINDINGS: Moderate colonic stool burden without evidence of enteric obstruction. Nondiagnostic evaluation for pneumoperitoneum secondary to supine patient positioning and exclusion of the lower thorax. Surgical mesh overlies the right lower abdomen. Post cholecystectomy. No acute osseus abnormalities. Stigmata of DISH within the lower thoracic spine. IMPRESSION: Moderate colonic stool burden without evidence of enteric obstruction. Electronically Signed   By: Sandi Mariscal M.D.   On: 04/29/2017 10:58   Ct Angio Chest Pe W And/or Wo Contrast  Result Date: 04/28/2017 CLINICAL DATA:  Weakness and shortness of breath. History of pancreatic cancer with metastatic disease. EXAM: CT ANGIOGRAPHY CHEST WITH CONTRAST TECHNIQUE: Multidetector CT imaging of the chest was performed using the standard protocol during bolus administration of intravenous contrast. Multiplanar CT image reconstructions and MIPs were obtained to  evaluate the vascular anatomy. CONTRAST:  100 mL ISOVUE-370 IOPAMIDOL (ISOVUE-370) INJECTION 76% COMPARISON:  04/13/2017 CT chest. FINDINGS: Cardiovascular: Satisfactory opacification of the pulmonary arteries to the segmental level. No evidence of pulmonary embolism. Normal heart size. No pericardial effusion. Mediastinum/Nodes: No enlarged mediastinal, hilar, or axillary lymph nodes. Thyroid gland, trachea, and esophagus demonstrate no significant findings. Port-A-Cath good position. Lungs/Pleura: Unchanged 6 mm RIGHT upper lobe nodule. Upper Abdomen: Hepatic metastatic disease appears similar to priors. Pancreatic head and body mass redemonstrated. Musculoskeletal: No chest wall abnormality. No acute or significant osseous findings. Review of the MIP images confirms the above findings. IMPRESSION: No evidence for pulmonary emboli. No acute cardiopulmonary findings. Hepatic metastatic disease from pancreatic cancer. Study is insufficient characterize for improvement or progression. Electronically Signed   By: Staci Righter M.D.   On: 04/28/2017 15:51   Ct Angio Chest Pe W/cm &/or Wo Cm  Result Date: 04/13/2017 CLINICAL DATA:  Pancreatic carcinoma. Referral cancer center for hypotension and malaise. Chills and nausea and vomiting. Chronic RIGHT-sided abdominal pain. EXAM: CT ANGIOGRAPHY CHEST CT ABDOMEN AND PELVIS WITH CONTRAST TECHNIQUE: Multidetector CT imaging of the chest was performed using the standard protocol during bolus administration of intravenous contrast. Multiplanar CT image reconstructions and MIPs were obtained to evaluate the vascular anatomy. Multidetector CT imaging of the abdomen and pelvis was performed using the standard protocol during bolus administration of intravenous contrast. CONTRAST:  175mL ISOVUE-370 IOPAMIDOL (ISOVUE-370) INJECTION 76% COMPARISON:  CT chest abdomen pelvis 03/11/2017 FINDINGS: CTA CHEST FINDINGS Cardiovascular: No filling defects within the pulmonary arteries  arteries to suggest acute pulmonary embolism. No significant vascular findings. Normal heart size. No pericardial effusion. Mediastinum/Nodes: No axillary supraclavicular adenopathy. Port in the RIGHT chest wall with tip in distal SVC. No mediastinal hilar adenopathy. No pericardial fluid. Esophagus normal. Lungs/Pleura: 6 mm RIGHT upper lobe pulmonary nodule is not changed compared to 03/11/2017. Lesions increased in size from 12/06/2016 no new pulmonary nodules. Musculoskeletal: No aggressive osseous lesion Review of the MIP images confirms the above findings. CT ABDOMEN and PELVIS FINDINGS Hepatobiliary: Multiple round low-density lesions in liver parenchyma consistent with hepatic metastasis are not changed in short interval from 03/11/2017. No new lesions. Postcholecystectomy. Pancreas: Lesion head of pancreas measures smaller at 3.7 by 4.9 cm compared to 4.5 x 5.2 cm. There is atrophy duct dilatation the body and tail the pancreas. Spleen: Geographic low attenuation within the medial aspect the spleen is likely a profusion phenomena (image 34, series 4). Adrenals/urinary tract: Adrenal glands and kidneys are normal. The ureters and bladder normal. Stomach/Bowel: Stomach, small bowel, appendix, and cecum are normal. The colon and rectosigmoid colon are normal. Vascular/Lymphatic: Abdominal aorta is normal caliber. There is no retroperitoneal or periportal lymphadenopathy. No pelvic lymphadenopathy. Reproductive: Post hysterectomy. Other: No peritoneal nodularity Musculoskeletal: No aggressive osseous lesion. Review of the MIP images confirms the above findings. IMPRESSION: Chest Impression: 1. No evidence acute pulmonary embolism. 2. Suspicious RIGHT upper lobe pulmonary nodule not changed in short interval from 03/11/2017. Abdomen / Pelvis Impression: 1. Interval decrease in size of pancreatic mass in short interval follow-up. 2. Stable bilobed hepatic metastasis. 3. No peritoneal metastatic disease or  progressive adenopathy. Electronically Signed   By: Suzy Bouchard M.D.   On: 04/13/2017 17:55   Mr Jeri Cos WF Contrast  Result Date: 04/30/2017 CLINICAL DATA:  62 y/o F; metastatic pancreatic cancer presenting with nausea, lightheadedness, fatigue, and shortness of breath. EXAM: MRI HEAD WITHOUT AND WITH CONTRAST TECHNIQUE: Multiplanar, multiecho pulse sequences of the brain and surrounding structures were obtained without and with intravenous contrast. CONTRAST:  63mL MULTIHANCE GADOBENATE DIMEGLUMINE 529 MG/ML IV SOLN COMPARISON:  02/05/2017 MRI of the head. FINDINGS: Brain: Moderate motion artifact degrades multiple sequences. No acute infarction, hemorrhage, hydrocephalus, extra-axial collection or mass lesion. Cavum septum pellucidum. After administration of intravenous contrast there is NO abnormal enhancement of the brain Vascular: Normal flow voids. Skull and upper cervical spine: Normal marrow signal. Sinuses/Orbits: Negative. Other: None. IMPRESSION: Stable normal for age MRI of the brain.  Moderate motion artifact. Electronically Signed   By: Kristine Garbe M.D.   On: 04/30/2017 19:22   Ct Abdomen Pelvis W Contrast  Result Date: 04/28/2017 CLINICAL DATA:  Progressive abdominal pain with history of pancreatic cancer. EXAM: CT ABDOMEN AND PELVIS WITH CONTRAST TECHNIQUE: Multidetector CT imaging of the abdomen and pelvis was performed using the standard protocol following bolus administration of intravenous contrast. CONTRAST:  80 cc Isovue 300 intravenously. COMPARISON:  Abdominal CT 04/13/2017 FINDINGS: Lower chest: Atelectatic changes in the right lung base. Partially visualized 6 mm right upper lobe pulmonary nodule. Hepatobiliary: No significant change in numerous bilateral hepatic metastatic lesions. Pancreas: Decreased in size hypoattenuated body of the pancreas malignancy, now measuring approximately 4.0 by 2.5 cm. Persistent dilation of the downstream main pancreatic duct and  atrophy of the tail of the pancreas. Persistent peripancreatic fat stranding. Small peripancreatic lymph nodes appear stable. Spleen: Normal in size without focal abnormality. Adrenals/Urinary Tract: Adrenal  glands are unremarkable. Kidneys are normal, without renal calculi, focal lesion, or hydronephrosis. Bladder is unremarkable. Stomach/Bowel: Stomach is within normal limits. Appendix appears normal. No evidence of bowel wall thickening, distention, or inflammatory changes. Vascular/Lymphatic: Mild upper abdominal retroperitoneal lymphadenopathy. No significant vascular findings. Reproductive: Status post hysterectomy. No adnexal masses. Other: No abdominal wall hernia or abnormality. No abdominopelvic ascites. Prior ventral hernia repair. Musculoskeletal: No acute or significant osseous findings. IMPRESSION: Suboptimal timing of contrast administration. Interval mild decrease in size of pancreatic body malignancy. Mild upper abdominal retroperitoneal lymphadenopathy. No significant change in widespread hepatic metastatic disease. Electronically Signed   By: Fidela Salisbury M.D.   On: 04/28/2017 17:39   Ct Abdomen Pelvis W Contrast  Result Date: 04/13/2017 CLINICAL DATA:  Pancreatic carcinoma. Referral cancer center for hypotension and malaise. Chills and nausea and vomiting. Chronic RIGHT-sided abdominal pain. EXAM: CT ANGIOGRAPHY CHEST CT ABDOMEN AND PELVIS WITH CONTRAST TECHNIQUE: Multidetector CT imaging of the chest was performed using the standard protocol during bolus administration of intravenous contrast. Multiplanar CT image reconstructions and MIPs were obtained to evaluate the vascular anatomy. Multidetector CT imaging of the abdomen and pelvis was performed using the standard protocol during bolus administration of intravenous contrast. CONTRAST:  166mL ISOVUE-370 IOPAMIDOL (ISOVUE-370) INJECTION 76% COMPARISON:  CT chest abdomen pelvis 03/11/2017 FINDINGS: CTA CHEST FINDINGS Cardiovascular:  No filling defects within the pulmonary arteries arteries to suggest acute pulmonary embolism. No significant vascular findings. Normal heart size. No pericardial effusion. Mediastinum/Nodes: No axillary supraclavicular adenopathy. Port in the RIGHT chest wall with tip in distal SVC. No mediastinal hilar adenopathy. No pericardial fluid. Esophagus normal. Lungs/Pleura: 6 mm RIGHT upper lobe pulmonary nodule is not changed compared to 03/11/2017. Lesions increased in size from 12/06/2016 no new pulmonary nodules. Musculoskeletal: No aggressive osseous lesion Review of the MIP images confirms the above findings. CT ABDOMEN and PELVIS FINDINGS Hepatobiliary: Multiple round low-density lesions in liver parenchyma consistent with hepatic metastasis are not changed in short interval from 03/11/2017. No new lesions. Postcholecystectomy. Pancreas: Lesion head of pancreas measures smaller at 3.7 by 4.9 cm compared to 4.5 x 5.2 cm. There is atrophy duct dilatation the body and tail the pancreas. Spleen: Geographic low attenuation within the medial aspect the spleen is likely a profusion phenomena (image 34, series 4). Adrenals/urinary tract: Adrenal glands and kidneys are normal. The ureters and bladder normal. Stomach/Bowel: Stomach, small bowel, appendix, and cecum are normal. The colon and rectosigmoid colon are normal. Vascular/Lymphatic: Abdominal aorta is normal caliber. There is no retroperitoneal or periportal lymphadenopathy. No pelvic lymphadenopathy. Reproductive: Post hysterectomy. Other: No peritoneal nodularity Musculoskeletal: No aggressive osseous lesion. Review of the MIP images confirms the above findings. IMPRESSION: Chest Impression: 1. No evidence acute pulmonary embolism. 2. Suspicious RIGHT upper lobe pulmonary nodule not changed in short interval from 03/11/2017. Abdomen / Pelvis Impression: 1. Interval decrease in size of pancreatic mass in short interval follow-up. 2. Stable bilobed hepatic  metastasis. 3. No peritoneal metastatic disease or progressive adenopathy. Electronically Signed   By: Suzy Bouchard M.D.   On: 04/13/2017 17:55   Ir Removal [employer] Access W/ Port W/o Fl Mod Sed  Result Date: 05/08/2017 CLINICAL DATA:  End-stage metastatic pancreatic carcinoma and unexplained fever with possibility that indwelling port is a source bacteremia. Request has been made to remove the port. EXAM: REMOVAL OF IMPLANTED TUNNELED PORT-A-CATH MEDICATIONS: Moderate (conscious) sedation was employed during this procedure. A total of Versed 0.5 mg and Fentanyl 25 mcg was administered intravenously. Moderate Sedation Time:  20 minutes. The patient's level of consciousness and vital signs were monitored continuously by radiology nursing throughout the procedure under my direct supervision. PROCEDURE: The right chest Port-A-Cath site was prepped with chlorhexidine. A sterile gown and gloves were worn during the procedure. Local anesthesia was provided with 1% lidocaine. An incision was made overlying the Port-A-Cath with a #15 scalpel. Utilizing sharp and blunt dissection, the Port-A-Cath was removed. Portable cautery was utilized. Retention sutures were removed. The pocked was irrigated with sterile saline. Wound closure was performed with subcutaneous 3-0 Monocryl, subcuticular 4-0 Vicryl and Dermabond. The entire Port-A-Cath was removed successfully. IMPRESSION: Removal of implanted Port-A-Cath utilizing sharp and blunt dissection. The procedure was uncomplicated. Electronically Signed   By: Aletta Edouard M.D.   On: 05/08/2017 16:03   Dg Chest Port 1v Same Day  Result Date: 05/06/2017 CLINICAL DATA:  Weakness and shortness of breath. EXAM: PORTABLE CHEST 1 VIEW COMPARISON:  04/28/2017 CT, 04/24/2017 radiographs and prior studies. FINDINGS: Cardiomegaly and right Port-A-Cath with tip overlying the mid SVC again noted. Right hemithorax volume loss and mild right basilar atelectasis/scarring again noted.  There is no evidence of airspace disease, pleural effusion or pneumothorax. No acute bony abnormalities are present. IMPRESSION: Unchanged appearance of the chest with mild right basilar atelectasis/scarring. Electronically Signed   By: Margarette Canada M.D.   On: 05/06/2017 07:54   Dg Abd Portable 1v  Result Date: 04/25/2017 CLINICAL DATA:  Abdominal pain EXAM: PORTABLE ABDOMEN - 1 VIEW COMPARISON:  CT abdomen and pelvis 04/13/2017 FINDINGS: Prior ventral hernia repair in pelvis. Surgical clips RIGHT upper quadrant related to prior cholecystectomy. Nonobstructive bowel gas pattern. No bowel dilatation or bowel wall thickening. Bones demineralized. Small pelvic phleboliths without definite urinary tract calcification. IMPRESSION: Normal bowel gas pattern. Electronically Signed   By: Lavonia Dana M.D.   On: 04/25/2017 15:37   Dg Abd Portable 2 Views  Result Date: 04/28/2017 CLINICAL DATA:  Vomiting EXAM: PORTABLE ABDOMEN - 2 VIEW COMPARISON:  04/25/2017 FINDINGS: The bowel gas pattern is normal. There is no evidence of free air. No radio-opaque calculi or other significant radiographic abnormality is seen. IMPRESSION: Negative. Electronically Signed   By: Kathreen Devoid   On: 04/28/2017 14:17     PERTINENT LAB RESULTS: CBC: Recent Labs    05/08/17 0609 05/09/17 0552  WBC 13.1* 11.9*  HGB 8.4* 8.5*  HCT 26.5* 27.2*  PLT 167 175   CMET CMP     Component Value Date/Time   NA 137 05/09/2017 0552   NA 137 03/19/2017 1422   K 4.1 05/09/2017 0552   K 3.4 (L) 03/19/2017 1422   CL 99 (L) 05/09/2017 0552   CO2 26 05/09/2017 0552   CO2 24 03/19/2017 1422   GLUCOSE 128 (H) 05/09/2017 0552   GLUCOSE 158 (H) 03/19/2017 1422   BUN 8 05/09/2017 0552   BUN 6.2 (L) 03/19/2017 1422   CREATININE 0.58 05/09/2017 0552   CREATININE 0.61 05/04/2017 1140   CREATININE 0.8 03/19/2017 1422   CALCIUM 11.0 (H) 05/09/2017 0552   CALCIUM 8.8 03/19/2017 1422   PROT 6.2 (L) 05/07/2017 0607   PROT 6.6 03/19/2017 1422    ALBUMIN 2.4 (L) 05/07/2017 0607   ALBUMIN 2.7 (L) 03/19/2017 1422   AST 41 05/07/2017 0607   AST 41 (H) 05/04/2017 1140   AST 24 03/19/2017 1422   ALT 25 05/07/2017 0607   ALT 24 05/04/2017 1140   ALT 20 03/19/2017 1422   ALKPHOS 250 (H) 05/07/2017 1610  ALKPHOS 150 03/19/2017 1422   BILITOT 1.2 05/07/2017 0607   BILITOT 0.9 05/04/2017 1140   BILITOT 0.42 03/19/2017 1422   GFRNONAA >60 05/09/2017 0552   GFRNONAA >60 05/04/2017 1140   GFRAA >60 05/09/2017 0552   GFRAA >60 05/04/2017 1140    GFR Estimated Creatinine Clearance: 90.5 mL/min (by C-G formula based on SCr of 0.58 mg/dL). No results for input(s): LIPASE, AMYLASE in the last 72 hours. No results for input(s): CKTOTAL, CKMB, CKMBINDEX, TROPONINI in the last 72 hours. Invalid input(s): POCBNP No results for input(s): DDIMER in the last 72 hours. No results for input(s): HGBA1C in the last 72 hours. No results for input(s): CHOL, HDL, LDLCALC, TRIG, CHOLHDL, LDLDIRECT in the last 72 hours. No results for input(s): TSH, T4TOTAL, T3FREE, THYROIDAB in the last 72 hours.  Invalid input(s): FREET3 No results for input(s): VITAMINB12, FOLATE, FERRITIN, TIBC, IRON, RETICCTPCT in the last 72 hours. Coags: Recent Labs    05/08/17 0609  INR 1.85   Microbiology: Recent Results (from the past 240 hour(s))  Culture, Blood     Status: None (Preliminary result)   Collection Time: 05/04/17 11:40 AM  Result Value Ref Range Status   Specimen Description BLOOD PORTA CATH  Final   Special Requests   Final    BOTTLES DRAWN AEROBIC AND ANAEROBIC Blood Culture adequate volume   Culture   Final    NO GROWTH 4 DAYS Performed at [facility], [address] [place], [place] [place] [id number]    Report Status PENDING  Incomplete  Urine Culture     Status: Abnormal   Collection Time: 05/04/17 12:16 PM  Result Value Ref Range Status   Specimen Description   Final    URINE, CLEAN CATCH Performed at [facility], [place] [address] [place], [place] [address]    Special Requests   Final    NONE Performed at [facility], [place] [address], [place],  [address]    Culture MULTIPLE SPECIES PRESENT, SUGGEST RECOLLECTION (A)  Final   Report Status 05/05/2017 FINAL  Final    FURTHER DISCHARGE INSTRUCTIONS:  Get Medicines reviewed and adjusted: Please take all your medications with you for your next visit with your Primary MD  Laboratory/radiological data: Please request your Primary MD to go over all hospital tests and procedure/radiological results at the follow up, please ask your Primary MD to get all Hospital records sent to his/her office.  In some cases, they will be blood work, cultures and biopsy results pending at the time of your discharge. Please request that your primary care M.D. goes through all the records of your hospital data and follows up on these results.  Also Note the following: If you experience worsening of your admission symptoms, develop shortness of breath, life threatening emergency, suicidal or homicidal thoughts you must seek medical attention immediately by calling 911 or calling your MD immediately  if symptoms less severe.  You must read complete instructions/literature along with all the possible adverse reactions/side effects for all the Medicines you take and that have been prescribed to you. Take any new Medicines after you have completely understood and accpet all the possible adverse reactions/side effects.   Do not drive when taking Pain medications or sleeping medications (Benzodaizepines)  Do not take more than prescribed Pain, Sleep and Anxiety Medications. It is not advisable to combine anxiety,sleep and pain medications without talking with your primary care practitioner  Special Instructions: If you have  smoked or chewed Tobacco  in the last 2 yrs please stop smoking, stop any regular Alcohol  and or any Recreational  drug use.  Wear Seat belts while driving.  Please note: You were cared for by a hospitalist during your hospital stay. Once you are discharged, your primary care physician will handle any further medical issues. Please note that NO REFILLS for any discharge medications will be authorized once you are discharged, as it is imperative that you return to your primary care physician (or establish a relationship with a primary care physician if you do not have one) for your post hospital discharge needs so that they can reassess your need for medications and monitor your lab values.  Total Time spent coordinating discharge including counseling, education and face to face time equals 45 minutes.  Signed: Shanker Ghimire 05/09/2017 10:20 AM

## 2017-05-09 NOTE — Progress Notes (Signed)
IP PROGRESS NOTE  Subjective:   Heather Frederick underwent removal of the Port-A-Cath yesterday.  No fever last night.  She remains confused.  Her husband is at the bedside.  They met with the hospice team yesterday.  The plan is to enroll in hospice care at discharge.  Objective: Vital signs in last 24 hours: Blood pressure 110/66, pulse 81, temperature 98.4 F (36.9 C), temperature source Oral, resp. rate 20, height 5' 3" (1.6 m), weight 244 lb (110.7 kg), SpO2 95 %.  Intake/Output from previous day: 02/12 0701 - 02/13 0700 In: 765 [P.O.:240; I.V.:525] Out: -   Physical Exam:  HEENT: No thrush Lungs: Clear anteriorly Cardiac: Regular rate and rhythm Extremities: No leg edema Neurologic: Alert, follows commands  Portacath site with a bandage.  No tenderness.  Lab Results: Recent Labs    05/08/17 0609 05/09/17 0552  WBC 13.1* 11.9*  HGB 8.4* 8.5*  HCT 26.5* 27.2*  PLT 167 175    BMET Recent Labs    05/08/17 0609 05/09/17 0552  NA 134* 137  K 4.1 4.1  CL 97* 99*  CO2 26 26  GLUCOSE 101* 128*  BUN 7 8  CREATININE 0.54  0.56 0.58  CALCIUM 9.7 11.0*    No results found for: CEA1  Studies/Results: Ir Removal [employer] W/o Fl Mod Sed  Result Date: 05/08/2017 CLINICAL DATA:  End-stage metastatic pancreatic carcinoma and unexplained fever with possibility that indwelling port is a source bacteremia. Request has been made to remove the port. EXAM: REMOVAL OF IMPLANTED TUNNELED PORT-A-CATH MEDICATIONS: Moderate (conscious) sedation was employed during this procedure. A total of Versed 0.5 mg and Fentanyl 25 mcg was administered intravenously. Moderate Sedation Time: 20 minutes. The patient's level of consciousness and vital signs were monitored continuously by radiology nursing throughout the procedure under my direct supervision. PROCEDURE: The right chest Port-A-Cath site was prepped with chlorhexidine. A sterile gown and gloves were worn during the procedure. Local  anesthesia was provided with 1% lidocaine. An incision was made overlying the Port-A-Cath with a #15 scalpel. Utilizing sharp and blunt dissection, the Port-A-Cath was removed. Portable cautery was utilized. Retention sutures were removed. The pocked was irrigated with sterile saline. Wound closure was performed with subcutaneous 3-0 Monocryl, subcuticular 4-0 Vicryl and Dermabond. The entire Port-A-Cath was removed successfully. IMPRESSION: Removal of implanted Port-A-Cath utilizing sharp and blunt dissection. The procedure was uncomplicated. Electronically Signed   By: Aletta Edouard M.D.   On: 05/08/2017 16:03    Medications: I have reviewed the patient's current medications.  Assessment/Plan: 1. Metastatic pancreas cancer ? Pancreas body mass and liver metastases noted on CT abdomen/pelvis 08/11/2016 ? Ultrasound-guided biopsy of a right liver lesion 08/16/2016 revealed poorly differentiated adenocarcinoma consistent with pancreas cancer ? Foundation 1-microsatellite stable; tumor mutational burden 1; ERBB2 amplification ? Cycle 1 gemcitabine/Abraxane 09/13/2016; 09/29/2016 ? Cycle 2 gemcitabine/Abraxane 10/11/2016, 10/18/2016 ? Cycle 3 gemcitabine/Abraxane 11/01/2016, 11/08/2016 ? Cycle 4 gemcitabine/Abraxane 11/23/2016,11/29/2016 ? CT chest 12/06/2016-liver lesions appear smaller ? Cycle 5 gemcitabine/Abraxane 12/13/2016 ? CT abdomen/pelvis 01/01/2017-new and enlarging hepatic masses. Enlarging pancreatic mass. ? Cycle 6 gemcitabine/Abraxane 01/03/2017 ? Rising CA 19-9, increased pain October 2018 ? Cycle 1 FOLFOX 01/31/2017 ? Cycle 2 FOLFOX 02/19/2017 ? Cycle 3FOLFIRINOX 03/06/2017 ? CT 03/11/2017 (compared to 01/01/2017) increased liver lesions and enlargement of the pancreas mass ? Cycle4FOLFIRINOX 03/21/2017 (oxaliplatin dose reduced and Neulasta added) ? Cycle 5 FOLFIRINOX 04/09/2017 ? CT 04/13/2017-decrease in the size of the pancreas mass, no change in multiple liver  lesions  2. Pain secondary to pancreas cancer, MS Contin added 03/13/2017  3. Hypertension  4. Sleep apnea  5. Chronic low back pain  6. Recurrent urinary tract infections  7. Depression  8. Migraine headaches  9. Port-A-Cath placement 08/28/2016  10. Rash following cycle 1 gemcitabine/Abraxane-drug rash?  11. Nausea/vomiting following cycle 1 gemcitabine/Abraxane-antiemetic regimen adjusted with addition of Aloxi  12. Diabetes  13. Right cephalic vein thrombosis 71/24/5809-[id number] with Xarelto  14. CT chest 12/06/2016 done to evaluate dyspnea-several 3-4 mm nodular opacities in the lung parenchyma etiology uncertain. Known mass in the body of the pancreas measuring 3.3 x 2.4 cm;small enhancing lesion in the anterior dome of the liver measures 8 mm.  15. Right forearm rash following cycle 5 day 8 gemcitabine/Abraxane. Resolved  16.Klebsiella urinary tract infection 03/14/2017  17.Admission 04/13/2017 with diaphoresis and tachycardia-resolved, 1 blood culture positive for a coagulase-negative Staphylococcus  18. Admission 04/19/2017 with recurrent hypotension  19. Admission 04/24/2017 with recurrent hypotension and diaphoresis  20.Admission 04/28/2017 with diaphoresis, fever, and weakness  21.  Admission 05/04/2017 with altered mental status and fever  Port-A-Cath removal 05/08/2017  Trial of low-dose prednisone for "tumor fever "beginning 05/08/2017   Heather Frederick appears stable.  The Port-A-Cath was removed yesterday.  She is now on a trial of prednisone for tumor fever.  I discussed the situation with her husband at the bedside.  The plan is for home hospice care.  We will consolidate the medical regimen at discharge.  She has been maintained on Xarelto anticoagulation after being diagnosed with a right upper extremity facial thrombosis in August 2018.  The thrombosis was likely related to the indwelling Port-A-Cath and increased  cancer.  She has been maintained on anticoagulation for the past 6 months.  We discussed the risk/benefit of continuing anticoagulation.  I recommend discontinuing Xarelto.  Outpatient follow-up will be scheduled at the Cancer center   Recommendations: 1.  Discharge to home with hospice care 2.  Continue MS Contin and oxycodone for pain 3.  Discontinue Xarelto 4.  Continue low-dose prednisone for tumor fever (she reports allergies to naproxen and ibuprofen) 5.  Stop antibiotics and midodrine         LOS: 5 days   Betsy Coder, MD   05/09/2017, 7:16 AM

## 2017-05-09 NOTE — Progress Notes (Signed)
Patient has discharged to home on 05/09/17. Discharge instructions including medications and appointments was given to patient; spouse at bedside; patient has no question at this time.

## 2017-05-10 ENCOUNTER — Telehealth: Payer: Self-pay

## 2017-05-10 NOTE — Telephone Encounter (Signed)
Spoke with Renee from [facility]. She noted that Oxy 10mg  q6 "was not helping pain, her husband states shes been asking for it q4". Wondering if Dr. Benay Spice wants to increase dose or frequency. Per Dr. Benay Spice, pt can take Oxy every four hours as needed. Renee voiced understanding.

## 2017-05-12 ENCOUNTER — Inpatient Hospital Stay (HOSPITAL_COMMUNITY)
Admission: EM | Admit: 2017-05-12 | Discharge: 2017-05-15 | DRG: 436 | Disposition: A | Discharge status: Hospice | Payer: 59 | Attending: Family Medicine | Admitting: Family Medicine

## 2017-05-12 ENCOUNTER — Encounter (HOSPITAL_COMMUNITY): Payer: Self-pay

## 2017-05-12 DIAGNOSIS — I10 Essential (primary) hypertension: Secondary | ICD-10-CM | POA: Diagnosis present

## 2017-05-12 DIAGNOSIS — G4733 Obstructive sleep apnea (adult) (pediatric): Secondary | ICD-10-CM | POA: Diagnosis present

## 2017-05-12 DIAGNOSIS — Z888 Allergy status to other drugs, medicaments and biological substances status: Secondary | ICD-10-CM

## 2017-05-12 DIAGNOSIS — R400 Somnolence: Secondary | ICD-10-CM

## 2017-05-12 DIAGNOSIS — Z66 Do not resuscitate: Secondary | ICD-10-CM | POA: Diagnosis present

## 2017-05-12 DIAGNOSIS — R4182 Altered mental status, unspecified: Secondary | ICD-10-CM | POA: Diagnosis present

## 2017-05-12 DIAGNOSIS — Z90710 Acquired absence of both cervix and uterus: Secondary | ICD-10-CM

## 2017-05-12 DIAGNOSIS — E118 Type 2 diabetes mellitus with unspecified complications: Secondary | ICD-10-CM | POA: Diagnosis present

## 2017-05-12 DIAGNOSIS — Z886 Allergy status to analgesic agent status: Secondary | ICD-10-CM | POA: Diagnosis not present

## 2017-05-12 DIAGNOSIS — C787 Secondary malignant neoplasm of liver and intrahepatic bile duct: Secondary | ICD-10-CM | POA: Diagnosis not present

## 2017-05-12 DIAGNOSIS — Z91030 Bee allergy status: Secondary | ICD-10-CM | POA: Diagnosis not present

## 2017-05-12 DIAGNOSIS — Z882 Allergy status to sulfonamides status: Secondary | ICD-10-CM

## 2017-05-12 DIAGNOSIS — Z96652 Presence of left artificial knee joint: Secondary | ICD-10-CM | POA: Diagnosis present

## 2017-05-12 DIAGNOSIS — F419 Anxiety disorder, unspecified: Secondary | ICD-10-CM | POA: Diagnosis present

## 2017-05-12 DIAGNOSIS — Z515 Encounter for palliative care: Secondary | ICD-10-CM | POA: Diagnosis present

## 2017-05-12 DIAGNOSIS — C259 Malignant neoplasm of pancreas, unspecified: Secondary | ICD-10-CM

## 2017-05-12 DIAGNOSIS — Z794 Long term (current) use of insulin: Secondary | ICD-10-CM | POA: Diagnosis not present

## 2017-05-12 DIAGNOSIS — Z6841 Body Mass Index (BMI) 40.0 and over, adult: Secondary | ICD-10-CM

## 2017-05-12 DIAGNOSIS — R410 Disorientation, unspecified: Secondary | ICD-10-CM

## 2017-05-12 DIAGNOSIS — Z7952 Long term (current) use of systemic steroids: Secondary | ICD-10-CM | POA: Diagnosis not present

## 2017-05-12 DIAGNOSIS — Z79899 Other long term (current) drug therapy: Secondary | ICD-10-CM

## 2017-05-12 DIAGNOSIS — C251 Malignant neoplasm of body of pancreas: Principal | ICD-10-CM | POA: Diagnosis present

## 2017-05-12 HISTORY — DX: Type 2 diabetes mellitus without complications: E11.9

## 2017-05-12 MED ORDER — MORPHINE SULFATE (PF) 4 MG/ML IV SOLN
8.0000 mg | INTRAVENOUS | Status: DC | PRN
  Administered 2017-05-12 – 2017-05-13 (×4): 8 mg via INTRAVENOUS
  Filled 2017-05-12 (×5): qty 2

## 2017-05-12 MED ORDER — BISACODYL 5 MG PO TBEC: 10.0000 mg | DELAYED_RELEASE_TABLET | Freq: Every day | ORAL | Status: DC | PRN

## 2017-05-12 MED ORDER — PANTOPRAZOLE SODIUM 40 MG PO TBEC: 40.0000 mg | DELAYED_RELEASE_TABLET | Freq: Two times a day (BID) | ORAL | Status: DC

## 2017-05-12 MED ORDER — CYCLOBENZAPRINE HCL 10 MG PO TABS: 10.0000 mg | ORAL_TABLET | Freq: Three times a day (TID) | ORAL | Status: DC

## 2017-05-12 MED ORDER — PROSIGHT PO TABS: 1.0000 | ORAL_TABLET | Freq: Every day | ORAL | Status: DC

## 2017-05-12 MED ORDER — PREDNISONE 5 MG PO TABS
5.0000 mg | ORAL_TABLET | Freq: Two times a day (BID) | ORAL | Status: DC
  Filled 2017-05-12: qty 1

## 2017-05-12 MED ORDER — SODIUM CHLORIDE 0.45 % IV SOLN
INTRAVENOUS | Status: DC
  Administered 2017-05-13 – 2017-05-15 (×2): via INTRAVENOUS

## 2017-05-12 MED ORDER — ELETRIPTAN HYDROBROMIDE 40 MG PO TABS
40.0000 mg | ORAL_TABLET | ORAL | Status: DC | PRN
  Filled 2017-05-12: qty 1

## 2017-05-12 MED ORDER — NITROGLYCERIN 0.4 MG SL SUBL: 0.4000 mg | SUBLINGUAL_TABLET | SUBLINGUAL | Status: DC | PRN

## 2017-05-12 MED ORDER — LORAZEPAM 1 MG PO TABS: 2.0000 mg | ORAL_TABLET | Freq: Three times a day (TID) | ORAL | Status: DC | PRN

## 2017-05-12 MED ORDER — LACTULOSE 10 GM/15ML PO SOLN: 20.0000 g | Freq: Every day | ORAL | Status: DC | PRN

## 2017-05-12 MED ORDER — OXYCODONE HCL 5 MG PO TABS: 10.0000 mg | ORAL_TABLET | ORAL | Status: DC | PRN

## 2017-05-12 MED ORDER — MORPHINE SULFATE (PF) 4 MG/ML IV SOLN: 4.0000 mg | INTRAVENOUS | Status: DC | PRN

## 2017-05-12 MED ORDER — SENNOSIDES-DOCUSATE SODIUM 8.6-50 MG PO TABS: 2.0000 | ORAL_TABLET | Freq: Every day | ORAL | Status: DC

## 2017-05-12 MED ORDER — LORAZEPAM 1 MG PO TABS: 1.0000 mg | ORAL_TABLET | Freq: Three times a day (TID) | ORAL | Status: DC | PRN

## 2017-05-12 MED ORDER — ADULT MULTIVITAMIN W/MINERALS CH: 1.0000 | ORAL_TABLET | Freq: Every day | ORAL | Status: DC

## 2017-05-12 MED ORDER — POLYETHYLENE GLYCOL 3350 17 G PO PACK
17.0000 g | PACK | Freq: Two times a day (BID) | ORAL | Status: DC
  Administered 2017-05-13: 17 g via ORAL
  Filled 2017-05-12: qty 1

## 2017-05-12 MED ORDER — ACETAMINOPHEN 325 MG PO TABS: 650.0000 mg | ORAL_TABLET | Freq: Two times a day (BID) | ORAL | Status: DC

## 2017-05-12 MED ORDER — VITAMIN B-12 1000 MCG PO TABS: 2500.0000 ug | ORAL_TABLET | Freq: Every day | ORAL | Status: DC

## 2017-05-12 MED ORDER — OXYBUTYNIN CHLORIDE ER 5 MG PO TB24: 10.0000 mg | ORAL_TABLET | Freq: Every morning | ORAL | Status: DC

## 2017-05-12 MED ORDER — CITALOPRAM HYDROBROMIDE 20 MG PO TABS: 20.0000 mg | ORAL_TABLET | Freq: Every day | ORAL | Status: DC

## 2017-05-12 MED ORDER — ENSURE ENLIVE PO LIQD: 237.0000 mL | Freq: Two times a day (BID) | ORAL | Status: DC

## 2017-05-12 MED ORDER — ONDANSETRON HCL 4 MG PO TABS: 4.0000 mg | ORAL_TABLET | Freq: Four times a day (QID) | ORAL | Status: DC | PRN

## 2017-05-12 MED ORDER — MORPHINE SULFATE ER 30 MG PO TBCR: 30.0000 mg | EXTENDED_RELEASE_TABLET | Freq: Two times a day (BID) | ORAL | Status: DC

## 2017-05-12 MED ORDER — LORAZEPAM 2 MG/ML IJ SOLN
2.0000 mg | Freq: Once | INTRAMUSCULAR | Status: AC
  Administered 2017-05-13: 2 mg via INTRAVENOUS
  Filled 2017-05-12: qty 1

## 2017-05-12 MED ORDER — SIMETHICONE 80 MG PO CHEW: 80.0000 mg | CHEWABLE_TABLET | Freq: Four times a day (QID) | ORAL | Status: DC | PRN

## 2017-05-12 MED ORDER — PROCHLORPERAZINE MALEATE 10 MG PO TABS: 10.0000 mg | ORAL_TABLET | Freq: Four times a day (QID) | ORAL | Status: DC | PRN

## 2017-05-12 MED ORDER — LORAZEPAM 2 MG/ML IJ SOLN
1.0000 mg | Freq: Once | INTRAMUSCULAR | Status: AC
  Administered 2017-05-12: 1 mg via INTRAVENOUS
  Filled 2017-05-12: qty 1

## 2017-05-12 MED ORDER — INSULIN DETEMIR 100 UNIT/ML ~~LOC~~ SOLN
10.0000 [IU] | Freq: Every day | SUBCUTANEOUS | Status: DC
  Filled 2017-05-12: qty 0.1

## 2017-05-12 MED ORDER — PREGABALIN 100 MG PO CAPS: 200.0000 mg | ORAL_CAPSULE | Freq: Every day | ORAL | Status: DC

## 2017-05-12 NOTE — ED Notes (Signed)
ED TO INPATIENT HANDOFF REPORT  Name/Age/Gender Heather Frederick 62 y.o. female  Code Status    Code Status Orders  (From admission, onward)        Start     Ordered   05/12/17 2119  Do not attempt resuscitation/DNR  Continuous    Question Answer Comment  In the event of cardiac or respiratory ARREST Do not call a "code blue"   In the event of cardiac or respiratory ARREST Do not perform Intubation, CPR, defibrillation or ACLS   In the event of cardiac or respiratory ARREST Use medication by any route, position, wound care, and other measures to relive pain and suffering. May use oxygen, suction and manual treatment of airway obstruction as needed for comfort.      05/12/17 2118    Code Status History    Date Active Date Inactive Code Status Order ID Comments User Context   05/08/2017 08:36 05/09/2017 15:07 DNR [id number]  Ladell Pier, MD Inpatient   05/04/2017 19:11 05/08/2017 08:36 Full Code [id number]  [name], MD Inpatient   04/28/2017 20:23 05/01/2017 16:15 Full Code [id number]  Quintella Baton, MD Inpatient   04/19/2017 18:12 04/21/2017 19:02 Full Code [id number]  [name], MD Inpatient   04/13/2017 21:53 04/18/2017 19:27 Full Code [id number]  Norval Morton, MD ED   03/12/2017 23:45 03/15/2017 15:31 Full Code [id number]  Rise Patience, MD ED   02/23/2017 15:23 02/24/2017 17:58 Full Code [id number]  Annita Brod, MD ED   02/05/2017 15:35 02/06/2017 16:23 Full Code [id number]  [name], MD Inpatient   06/30/2016 11:03 07/01/2016 13:30 Full Code [id number]  Jackolyn Confer, MD Inpatient   05/06/2016 23:18 05/07/2016 21:01 Full Code [id number]  Etta Quill, DO ED   06/11/2014 18:28 06/12/2014 13:25 Full Code [id number]  Jackolyn Confer, MD Inpatient   03/10/2014 14:31 03/10/2014 21:12 Full Code [id number]  Belva Crome, MD Inpatient   09/10/2012 11:38 09/12/2012 17:57 Full Code [id number]  Garald Balding, MD Inpatient    Advance Directive Documentation     Most Recent Value   Type of Advance Directive  Out of facility DNR (pink MOST or yellow form)  Pre-existing out of facility DNR order (yellow form or pink MOST form)  Yellow form placed in chart (order not valid for inpatient use)  "MOST" Form in Place?  No data      Home/SNF/Other Home  Chief Complaint cancer pt  Level of Care/Admitting Diagnosis ED Disposition    ED Disposition Condition [facility] Area: [place] [[id number]]  Level of Care: Med-Surg [16]  Diagnosis: Pancreatic cancer [facility]) [[id number]]  Admitting Physician: Elwyn Reach [[id number]]  Attending Physician: Elwyn Reach [[id number]]  Estimated length of stay: past midnight tomorrow  Certification:: I certify this patient will need inpatient services for at least 2 midnights  PT Class (Do Not Modify): Inpatient [101]  PT Acc Code (Do Not Modify): Private [1]       Medical History Past Medical History:  Diagnosis Date  . Allergy   . Anxiety   . Arthritis   . Benign essential HTN 09/23/2014  . Cancer ([place])   . Chronic kidney disease    uti  . Depression   . Diabetes mellitus without complication ([place])   . Dyslipidemia   . Elevated liver enzymes   . Family history of anesthesia complication    father has a severe hard time waking up  . Gallstones  a. Seen on CT 01/2014.  Marland Kitchen GERD (gastroesophageal reflux disease)   . Hard of hearing   . Hepatic steatosis   . History of frequent urinary tract infections   . Hyperlipidemia   . Hypertension   . Lateral epicondylitis of right elbow   . Mental disorder   . Meralgia paresthetica of right side 10/02/2012   slight at 05/2014  . Migraine headache   . Obesity   . OSA (obstructive sleep apnea)    severe with AHI 37/hr now on CPAP at 18cm H2O  . Osteoarthritis   . Pancreatic cancer metastasized to liver ([place])   . Pneumonia    Feb 2018  . Raynaud disease    in feet per patient   . Sepsis ([place]) 02/23/2017  . Sleep apnea    wears c-pap  .  Urinary tract infection     Allergies Allergies  Allergen Reactions  . Topamax [Topiramate] Other (See Comments)    Stroke like symptoms  . Aleve [Naproxen Sodium] Hives    Has tolerated Voltaren topical as well as aspirin.  . Bee Venom Swelling  . Echinacea Hives  . Other Other (See Comments)    Feathers cause sinus congestion  . Sulfa Antibiotics Hives  . Advil [Ibuprofen] Hives    Has tolerated Voltaren topical as well as aspirin.    IV Location/Drains/Wounds Patient Lines/Drains/Airways Status   Active Line/Drains/Airways    Name:   Placement date:   Placement time:   Site:   Days:   Peripheral IV 05/12/17 Right Hand   05/12/17    1921    Hand   less than 1   Incision - 4 Ports   06/11/14    1146     1066   Incision - 3 Ports   06/30/16    0811     316          Labs/Imaging No results found for this or any previous visit (from the past 7 hour(s)). No results found.  Pending Labs Unresulted Labs (From admission, onward)   None      Vitals/Pain Today's Vitals   05/12/17 2001 05/12/17 2100 05/12/17 2200 05/12/17 2219  BP: (!) 101/58 (!) 104/52 114/63   Pulse: (!) 109 96 96   Resp:  20 20   Temp: (!) 97.3 F (36.3 C)     TempSrc: Rectal     SpO2: 100% 97% 97%   Weight:      Height:      PainSc:    Asleep    Isolation Precautions No active isolations  Medications Medications  morphine 4 MG/ML injection 8 mg (8 mg Intravenous Given 05/12/17 2153)  LORazepam (ATIVAN) injection 1 mg (1 mg Intravenous Given 05/12/17 2000)    Mobility non-ambulatory

## 2017-05-12 NOTE — ED Triage Notes (Signed)
Pt arrived from home due to delirium over the last 2 days. Pt recently treated with antibiotics due to UTI. Pt is a hospice pt, who suggested coming to ED for pain management.

## 2017-05-12 NOTE — H&P (Signed)
History and Physical    [name] GLO:[id number] DOB: 12/05/1955 DOA: 05/12/2017  Referring MD/NP/PA: Dr Vanita Panda  PCP: [name], MD Outpatient Specialists: Dr Betsy Coder   Patient coming from: Home  Chief Complaint: Severe pain and altered mental status  HPI: Heather Frederick is a 62 y.o. female with medical history significant of metastatic pancreatic cancer to the liver with morbid obesity obstructive sleep apnea as well as anxiety disorder who is currently on hospice care at home with plan to move patient to [facility] place when a bed is available patient. Patient is on hospice care at home after recent hospitalization and discharge. She has continued to have uncontrolled pain at home. Hospice has been adjusting patient's medications. She has remained delirious and not improving. There was plan to move patient to [facility] today but no bed available. Family brought patient to the emergency room. Patient is being admitted for pain management optimization.  ED Course: Patient was given IV Ativan as well as pain medications. Discussion with husband and hospice care and plan to admit patient for pain management pending her transfer of to hospice.  Review of Systems: As per HPI otherwise 10 point review of systems negative.    Past Medical History:  Diagnosis Date  . Allergy   . Anxiety   . Arthritis   . Benign essential HTN 09/23/2014  . Cancer ([place])   . Chronic kidney disease    uti  . Depression   . Diabetes mellitus without complication ([place])   . Dyslipidemia   . Elevated liver enzymes   . Family history of anesthesia complication    father has a severe hard time waking up  . Gallstones    a. Seen on CT 01/2014.  Marland Kitchen GERD (gastroesophageal reflux disease)   . Hard of hearing   . Hepatic steatosis   . History of frequent urinary tract infections   . Hyperlipidemia   . Hypertension   . Lateral epicondylitis of right elbow   . Mental disorder   . Meralgia paresthetica  of right side 10/02/2012   slight at 05/2014  . Migraine headache   . Obesity   . OSA (obstructive sleep apnea)    severe with AHI 37/hr now on CPAP at 18cm H2O  . Osteoarthritis   . Pancreatic cancer metastasized to liver ([place])   . Pneumonia    Feb 2018  . Raynaud disease    in feet per patient   . Sepsis ([place]) 02/23/2017  . Sleep apnea    wears c-pap  . Urinary tract infection     Past Surgical History:  Procedure Laterality Date  . ABDOMINAL HYSTERECTOMY  1/04   partial  . BLADDER SUSPENSION  6/10  . CARDIAC CATHETERIZATION    . carpel tunnel left/right  7/08, 8/08 Bilateral 8/08 and 7/08  . carpel tunnel rel Right 4/12  . CHOLECYSTECTOMY N/A 06/11/2014   Procedure: LAPAROSCOPIC CHOLECYSTECTOMY WITH ATTEMPTED INTRAOPERATIVE CHOLANGIOGRAM;  Surgeon: Jackolyn Confer, MD;  Location: [facility];  Service: General;  Laterality: N/A;  . COLONOSCOPY    . ERCP N/A 10/19/2014   Procedure: ENDOSCOPIC RETROGRADE CHOLANGIOPANCREATOGRAPHY (ERCP);  Surgeon: Ladene Artist, MD;  Location: [facility];  Service: Endoscopy;  Laterality: N/A;  . INCISIONAL HERNIA REPAIR N/A 06/30/2016   Procedure: LAPAROSCOPIC REPAIR OF INCISIONAL HERNIA WITH MESH;  Surgeon: Jackolyn Confer, MD;  Location: [facility];  Service: General;  Laterality: N/A;  . INSERTION OF MESH N/A 06/30/2016   Procedure: INSERTION OF MESH;  Surgeon: Jackolyn Confer, MD;  Location: [facility];  Service: General;  Laterality: N/A;  . IR CV LINE INJECTION  11/24/2016  . IR FLUORO GUIDE PORT INSERTION RIGHT  08/28/2016  . IR REMOVAL TUN ACCESS W/ PORT W/O FL MOD SED  05/08/2017  . IR US GUIDE VASC ACCESS RIGHT  08/28/2016  . JOINT REPLACEMENT    . KNEE ARTHROSCOPY Left 12/12  . KNEE ARTHROSCOPY Right 12/06  . LEFT HEART CATHETERIZATION WITH CORONARY ANGIOGRAM N/A 03/10/2014   Procedure: LEFT HEART CATHETERIZATION WITH CORONARY ANGIOGRAM;  Surgeon: Sinclair Grooms, MD;  Location: [facility] C[facility] LAB;  Service: Cardiovascular;  Laterality: N/A;  . PLANTAR  FASCIA RELEASE Right 12/10  . radial tunnel release     right arm   . ROTATOR CUFF REPAIR Left 6/11  . tennis elbow release Right 7/04  . TOTAL KNEE ARTHROPLASTY Left 09/10/2012   Procedure: TOTAL KNEE ARTHROPLASTY- left;  Surgeon: Garald Balding, MD;  Location: [place];  Service: Orthopedics;  Laterality: Left;  Left total knee arthroplasty     reports that  has never smoked. she has never used smokeless tobacco. She reports that she drinks alcohol. She reports that she does not use drugs.  Allergies  Allergen Reactions  . Topamax [Topiramate] Other (See Comments)    Stroke like symptoms  . Aleve [Naproxen Sodium] Hives    Has tolerated Voltaren topical as well as aspirin.  . Bee Venom Swelling  . Echinacea Hives  . Other Other (See Comments)    Feathers cause sinus congestion  . Sulfa Antibiotics Hives  . Advil [Ibuprofen] Hives    Has tolerated Voltaren topical as well as aspirin.    Family History  Problem Relation Age of Onset  . Hypertension Mother   . Stroke Mother   . Liver disease Mother        Abcess  . Hypertension Father   . Coronary artery disease Father   . Migraines Father   . Clotting disorder Father   . Kidney failure Brother   . Hypertension Brother   . Migraines Brother   . Migraines Daughter   . Breast cancer Other        Niece with breast cancer  . Colon cancer Paternal Grandmother   . Pancreatic cancer Paternal Grandmother   . Stomach cancer Paternal Grandmother   . Breast cancer Cousin   . Esophageal cancer Neg Hx   . Rectal cancer Neg Hx     Prior to Admission medications   Medication Sig Start Date End Date Taking? Authorizing Provider  acetaminophen (TYLENOL) 325 MG tablet Take 2 tablets (650 mg total) by mouth 2 (two) times daily at 10 AM and 5 PM. 05/01/17   Georgette Shell, MD  bisacodyl (DULCOLAX) 5 MG EC tablet Take 2 tablets (10 mg total) by mouth daily as needed for moderate constipation. 05/09/17   Ghimire, Henreitta Leber, MD    citalopram (CELEXA) 20 MG tablet TAKE ONE (1) TABLET BY MOUTH EVERY DAY Patient taking differently: Take 20mg  by mouth daily 01/22/17   Hoyt Koch, MD  Cranberry-Vitamin C-Vitamin E (CRANBERRY PLUS VITAMIN C PO) Take 1 tablet by mouth daily. 4200 mg    [provider]  cyclobenzaprine (FLEXERIL) 5 MG tablet TAKE ONE (1) TABLET BY MOUTH 3 TIMES DAILY AS NEEDED FOR MUSCLE SPASMS Patient taking differently: Take 5mg  by mouth as needed for muscle spams 04/19/17   Ladell Pier, MD  eletriptan (RELPAX) 40 MG tablet Take  1 tablet (40 mg total) by mouth every 2 (two) hours as needed for migraine. 06/26/16   Dennie Bible, NP  feeding supplement, ENSURE ENLIVE, (ENSURE ENLIVE) LIQD Take 237 mLs by mouth 2 (two) times daily between meals. 05/09/17   Ghimire, Henreitta Leber, MD  insulin detemir (LEVEMIR) 100 unit/ml SOLN Inject 10 Units into the skin at bedtime.    [provider]  insulin lispro (HUMALOG) 100 UNIT/ML injection Inject 2-10 Units into the skin. Sliding Scale    [provider]  lactulose (CHRONULAC) 10 GM/15ML solution Take 30 mLs (20 g total) by mouth daily as needed for mild constipation. 05/09/17   Ghimire, Henreitta Leber, MD  LORazepam (ATIVAN) 1 MG tablet Take 1 tablet (1 mg total) by mouth every 8 (eight) hours as needed for anxiety or sedation (or nausea). 04/27/17   Ladell Pier, MD  LYRICA 200 MG capsule Take 200 mg by mouth daily. 04/03/17   [provider]  morphine (MS CONTIN) 30 MG 12 hr tablet Take 1 tablet (30 mg total) by mouth every 12 (twelve) hours. 04/27/17   Ladell Pier, MD  multivitamin-lutein [facility]) CAPS capsule Take 1 capsule by mouth daily.    [provider]  nitroGLYCERIN (NITROSTAT) 0.4 MG SL tablet Place 1 tablet (0.4 mg total) under the tongue every 5 (five) minutes as needed for chest pain. 06/28/15   Belva Crome, MD  ondansetron (ZOFRAN) 4 MG tablet Take 1 tablet (4 mg total) by mouth every 6  (six) hours as needed for nausea. 05/09/17   Ghimire, Henreitta Leber, MD  oxybutynin (DITROPAN-XL) 10 MG 24 hr tablet Take 1 tablet (10 mg total) by mouth every morning. 03/19/17   Ladell Pier, MD  oxyCODONE 10 MG TABS Take 1 tablet (10 mg total) by mouth every 6 (six) hours as needed for severe pain or breakthrough pain. 05/09/17   Ghimire, Henreitta Leber, MD  pantoprazole (PROTONIX) 40 MG tablet TAKE ONE (1) TABLET BY MOUTH TWO (2) TIMES DAILY Patient taking differently: Take 40mg  by mouth twice daily 09/25/16   Ladene Artist, MD  polyethylene glycol (MIRALAX / Floria Raveling) packet Take 17 g by mouth 2 (two) times daily. 05/09/17   Ghimire, Henreitta Leber, MD  predniSONE (DELTASONE) 5 MG tablet Take 1 tablet (5 mg total) by mouth 2 (two) times daily. 05/09/17   Ghimire, Henreitta Leber, MD  prochlorperazine (COMPAZINE) 10 MG tablet Take 10 mg by mouth every 6 (six) hours as needed for nausea or vomiting.    [provider]  senna-docusate (SENOKOT-S) 8.6-50 MG tablet Take 2 tablets by mouth at bedtime. 05/09/17   Ghimire, Henreitta Leber, MD  simethicone (MYLICON) 737 MG chewable tablet Chew 125-250 mg by mouth 2 (two) times daily as needed for flatulence.    [provider]  vitamin B-12 (CYANOCOBALAMIN) 1000 MCG tablet Take 2,500 mcg by mouth every morning.    [provider]    Physical Exam: Vitals:   05/12/17 1927 05/12/17 2001 05/12/17 2100 05/12/17 2200  BP:  (!) 101/58 (!) 104/52 114/63  Pulse:  (!) 109 96 96  Resp:   20 20  Temp:  (!) 97.3 F (36.3 C)    TempSrc:  Rectal    SpO2:  100% 97% 97%  Weight: 110.7 kg (244 lb)     Height: 5\' 3"  (1.6 m)         Constitutional: NAD, calm, comfortable Vitals:   05/12/17 1927 05/12/17 2001 05/12/17 2100 05/12/17  2200  BP:  (!) 101/58 (!) 104/52 114/63  Pulse:  (!) 109 96 96  Resp:   20 20  Temp:  (!) 97.3 F (36.3 C)    TempSrc:  Rectal    SpO2:  100% 97% 97%  Weight: 110.7 kg (244 lb)     Height: 5\' 3"  (1.6 m)      Eyes:  PERRL, lids and conjunctivae normal ENMT: Mucous membranes are moist.  Neck: normal, supple, no masses, no thyromegaly Respiratory: clear to auscultation bilaterally, no wheezing, no crackles. Normal respiratory effort. No accessory muscle use.  Cardiovascular: Regular rate and rhythm, no murmurs / rubs / gallops. No extremity edema. 2+ pedal pulses. No carotid bruits.  Abdomen: no tenderness, no masses palpated. No hepatosplenomegaly. Bowel sounds positive.  Musculoskeletal: no clubbing / cyanosis. No joint deformity upper and lower extremities. Good ROM, no contractures. Normal muscle tone.  Skin: no rashes, lesions, ulcers. No induration Neurologic: CN 2-12 grossly intact. Sensation intact, DTR normal. Strength 5/5 in all 4.  Psychiatric: Confused, Lethergic.   Labs on Admission: None  CBC: Recent Labs  Lab 05/07/17 0607 05/08/17 0609 05/09/17 0552  WBC 16.3* 13.1* 11.9*  NEUTROABS  --  9.4* 9.4*  HGB 8.9* 8.4* 8.5*  HCT 28.1* 26.5* 27.2*  MCV 88.4 89.5 90.1  PLT 178 167 622   Basic Metabolic Panel: Recent Labs  Lab 05/07/17 0607 05/08/17 0609 05/09/17 0552  NA 132* 134* 137  K 3.6 4.1 4.1  CL 96* 97* 99*  CO2 24 26 26   GLUCOSE 135* 101* 128*  BUN 5* 7 8  CREATININE 0.59 0.54  0.56 0.58  CALCIUM 9.9 9.7 11.0*  MG 1.5* 2.0  --    GFR: Estimated Creatinine Clearance: 90.5 mL/min (by C-G formula based on SCr of 0.58 mg/dL). Liver Function Tests: Recent Labs  Lab 05/07/17 0607  AST 41  ALT 25  ALKPHOS 250*  BILITOT 1.2  PROT 6.2*  ALBUMIN 2.4*   No results for input(s): LIPASE, AMYLASE in the last 168 hours. No results for input(s): AMMONIA in the last 168 hours. Coagulation Profile: Recent Labs  Lab 05/08/17 0609  INR 1.85   Cardiac Enzymes: No results for input(s): CKTOTAL, CKMB, CKMBINDEX, TROPONINI in the last 168 hours. BNP (last 3 results) No results for input(s): PROBNP in the last 8760 hours. HbA1C: No results for input(s): HGBA1C in the  last 72 hours. CBG: Recent Labs  Lab 05/07/17 2141 05/08/17 0807 05/08/17 1145 05/08/17 1633 05/08/17 2120  GLUCAP 170* 85 99 117* 109*   Lipid Profile: No results for input(s): CHOL, HDL, LDLCALC, TRIG, CHOLHDL, LDLDIRECT in the last 72 hours. Thyroid Function Tests: No results for input(s): TSH, T4TOTAL, FREET4, T3FREE, THYROIDAB in the last 72 hours. Anemia Panel: No results for input(s): VITAMINB12, FOLATE, FERRITIN, TIBC, IRON, RETICCTPCT in the last 72 hours. Urine analysis:    Component Value Date/Time   COLORURINE AMBER (A) 05/04/2017 1216   APPEARANCEUR CLEAR 05/04/2017 1216   LABSPEC 1.017 05/04/2017 1216   LABSPEC 1.005 03/28/2017 1319   PHURINE 6.0 05/04/2017 1216   GLUCOSEU NEGATIVE 05/04/2017 1216   GLUCOSEU Negative 03/28/2017 1319   HGBUR NEGATIVE 05/04/2017 1216   BILIRUBINUR NEGATIVE 05/04/2017 1216   BILIRUBINUR Negative 03/28/2017 1319   KETONESUR NEGATIVE 05/04/2017 1216   PROTEINUR 30 (A) 05/04/2017 1216   UROBILINOGEN 0.2 03/28/2017 1319   NITRITE NEGATIVE 05/04/2017 1216   LEUKOCYTESUR TRACE (A) 05/04/2017 1216   LEUKOCYTESUR Trace 03/28/2017 1319   Sepsis Labs: @  LABRCNTIP(procalcitonin:4,lacticidven:4) ) Recent Results (from the past 240 hour(s))  Culture, Blood     Status: None   Collection Time: 05/04/17 11:40 AM  Result Value Ref Range Status   Specimen Description BLOOD PORTA CATH  Final   Special Requests   Final    BOTTLES DRAWN AEROBIC AND ANAEROBIC Blood Culture adequate volume   Culture   Final    NO GROWTH 5 DAYS Performed at [facility], [address], [place], [place] [address]    Report Status 05/09/2017 FINAL  Final  Urine Culture     Status: Abnormal   Collection Time: 05/04/17 12:16 PM  Result Value Ref Range Status   Specimen Description   Final    URINE, CLEAN CATCH Performed at [facility], [place] [address] [place], [place] [address]    Special Requests   Final    NONE Performed  at [facility], [place] [address], [place], [place] [address]    Culture MULTIPLE SPECIES PRESENT, SUGGEST RECOLLECTION (A)  Final   Report Status 05/05/2017 FINAL  Final     Radiological Exams on Admission: No results found.    Assessment/Plan Principal Problem:   Pancreatic carcinoma metastatic to liver [facility]) Active Problems:   Altered mental status   Type 2 diabetes mellitus with complication [facility])   Pancreatic cancer ([place])    #1 metastatic pancreatic cancer: Patient is on comfort measures only. She will be admitted to the hospital. I will continue with MS Contin and increase the dose of her oxycodone from 10 mg every 6 hours to 10 mg every 4 hours as needed. Continue with the lorazepam. Adjust medication as needed until patient can go to inpatient hospice.  #2 altered mental status: Most likely medications related. Discussed with the husband that patient may require pain medications that will render her up tended in order to get relief. The prior cheese comfort measures at this point  #3 diabetes: No aggressive care at this point. We would limit blood glucose checking.   DVT prophylaxis: SCDs   Code Status: DO NOT RESUSCITATE   Family Communication: Husband who is at bedside   Disposition Plan: Hospice home   Consults called: Hospice   Admission status: Inpatient  Severity of Illness: The appropriate patient status for this patient is INPATIENT. Inpatient status is judged to be reasonable and necessary in order to provide the required intensity of service to ensure the patient's safety. The patient's presenting symptoms, physical exam findings, and initial radiographic and laboratory data in the context of their chronic comorbidities is felt to place them at high risk for further clinical deterioration. Furthermore, it is not anticipated that the patient will be medically stable for discharge from the hospital within 2 midnights of admission. The  following factors support the patient status of inpatient.   " The patient's presenting symptoms include confusion and pain. " The worrisome physical exam findings include generalized weakness and delirium. " The initial radiographic and laboratory data are worrisome because of known pancreatic cancer. " The chronic co-morbidities include pancreatic cancer.   * I certify that at the point of admission it is my clinical judgment that the patient will require inpatient hospital care spanning beyond 2 midnights from the point of admission due to high intensity of service, high risk for further deterioration and high frequency of surveillance required.Barbette Merino MD [facility] Pager [phone]  If 7PM-7AM, please contact night-coverage [email or web address] Password  [email or web address]  05/12/2017, 10:20 PM

## 2017-05-12 NOTE — ED Provider Notes (Signed)
[facility] DEPT Provider Note   CSN: [id number] Arrival date & time: 05/12/17  1857     History   Chief Complaint Chief Complaint  Patient presents with  . Altered Mental Status    HPI Heather Frederick is a 62 y.o. female.  HPI  Patient presents 2 days after being discharged from this facility, now with concern for worsening confusion, and increased pain. Patient has multiple medical issues, including pancreatic cancer with metastases, and is currently involved in hospice care. She was discharged 2 days ago with home hospice services. Husband provides the history, he notes that over the past 40 hours the patient has become more delirious, not consolable, not directable, and with uncontrolled pain in spite of giving her medication as directed, and having multiple consults with hospice at home. Patient herself is essentially noninteractive, screaming, moving about the bed, level 5 caveat secondary to acuity of condition.   Past Medical History:  Diagnosis Date  . Allergy   . Anxiety   . Arthritis   . Benign essential HTN 09/23/2014  . Cancer ([place])   . Chronic kidney disease    uti  . Depression   . Diabetes mellitus without complication ([place])   . Dyslipidemia   . Elevated liver enzymes   . Family history of anesthesia complication    father has a severe hard time waking up  . Gallstones    a. Seen on CT 01/2014.  Marland Kitchen GERD (gastroesophageal reflux disease)   . Hard of hearing   . Hepatic steatosis   . History of frequent urinary tract infections   . Hyperlipidemia   . Hypertension   . Lateral epicondylitis of right elbow   . Mental disorder   . Meralgia paresthetica of right side 10/02/2012   slight at 05/2014  . Migraine headache   . Obesity   . OSA (obstructive sleep apnea)    severe with AHI 37/hr now on CPAP at 18cm H2O  . Osteoarthritis   . Pancreatic cancer metastasized to liver ([place])   . Pneumonia    Feb 2018  . Raynaud disease     in feet per patient   . Sepsis ([place]) 02/23/2017  . Sleep apnea    wears c-pap  . Urinary tract infection     Patient Active Problem List   Diagnosis Date Noted  . Type 2 diabetes mellitus with complication ([place])   . UTI (urinary tract infection) 05/04/2017  . Altered mental status 05/04/2017  . Physical deconditioning 04/28/2017  . Dehydration 04/28/2017  . Anxiety and depression 04/28/2017  . Chronic pain 04/24/2017  . Diaphoresis 04/24/2017  . Generalized weakness 04/24/2017  . Dyspnea   . Anemia 04/19/2017  . Thrombocytopenia ([place]) 04/19/2017  . Leukocytosis 04/19/2017  . History of Dvt femoral (deep venous thrombosis) ([place]) 04/14/2017  . Anemia associated with chemotherapy 04/14/2017  . Hyponatremia 04/14/2017  . Pulmonary nodule, right 04/14/2017  . SIRS (systemic inflammatory response syndrome) ([place]) 04/13/2017  . Orthostatic hypotension 03/15/2017  . Pain 03/12/2017  . Abdominal pain, generalized 03/12/2017  . Abdominal pain 03/12/2017  . Sepsis ([place]) 02/24/2017  . Chronic anticoagulation 02/23/2017  . Fever 02/23/2017  . Dizziness 02/05/2017  . Portacath in place 10/18/2016  . Goals of care, counseling/discussion 09/08/2016  . Pancreatic carcinoma metastatic to liver ([place]) 08/29/2016  . Mid back pain 07/07/2016  . Incisional hernia 06/30/2016  . Nausea 05/06/2016  . Edema extremities 03/07/2016  . Pain of upper abdomen 01/27/2016  .  Depression 01/28/2015  . Bile duct stone   . Elevated LFTs   . Benign essential HTN 09/23/2014  . Routine general medical examination at a health care facility 08/01/2014  . OSA (obstructive sleep apnea) 07/04/2014  . Dyslipidemia 03/09/2014  . Diastolic heart failure, stage B ([place])   . GERD (gastroesophageal reflux disease)   . Meralgia paresthetica of right side 10/02/2012  . Osteoarthritis of left knee 09/12/2012  . Migraines 09/12/2012  . Obesity, Class III, BMI 40-49.9 (morbid obesity) ([place]) 09/12/2012    Past  Surgical History:  Procedure Laterality Date  . ABDOMINAL HYSTERECTOMY  1/04   partial  . BLADDER SUSPENSION  6/10  . CARDIAC CATHETERIZATION    . carpel tunnel left/right  7/08, 8/08 Bilateral 8/08 and 7/08  . carpel tunnel rel Right 4/12  . CHOLECYSTECTOMY N/A 06/11/2014   Procedure: LAPAROSCOPIC CHOLECYSTECTOMY WITH ATTEMPTED INTRAOPERATIVE CHOLANGIOGRAM;  Surgeon: Jackolyn Confer, MD;  Location: [facility];  Service: General;  Laterality: N/A;  . COLONOSCOPY    . ERCP N/A 10/19/2014   Procedure: ENDOSCOPIC RETROGRADE CHOLANGIOPANCREATOGRAPHY (ERCP);  Surgeon: Ladene Artist, MD;  Location: [facility];  Service: Endoscopy;  Laterality: N/A;  . INCISIONAL HERNIA REPAIR N/A 06/30/2016   Procedure: LAPAROSCOPIC REPAIR OF INCISIONAL HERNIA WITH MESH;  Surgeon: Jackolyn Confer, MD;  Location: [facility];  Service: General;  Laterality: N/A;  . INSERTION OF MESH N/A 06/30/2016   Procedure: INSERTION OF MESH;  Surgeon: Jackolyn Confer, MD;  Location: [facility];  Service: General;  Laterality: N/A;  . IR CV LINE INJECTION  11/24/2016  . IR FLUORO GUIDE PORT INSERTION RIGHT  08/28/2016  . IR REMOVAL TUN ACCESS W/ PORT W/O FL MOD SED  05/08/2017  . IR US GUIDE VASC ACCESS RIGHT  08/28/2016  . JOINT REPLACEMENT    . KNEE ARTHROSCOPY Left 12/12  . KNEE ARTHROSCOPY Right 12/06  . LEFT HEART CATHETERIZATION WITH CORONARY ANGIOGRAM N/A 03/10/2014   Procedure: LEFT HEART CATHETERIZATION WITH CORONARY ANGIOGRAM;  Surgeon: Sinclair Grooms, MD;  Location[facility];  Service: Cardiovascular;  Laterality: N/A;  . PLANTAR FASCIA RELEASE Right 12/10  . radial tunnel release     right arm   . ROTATOR CUFF REPAIR Left 6/11  . tennis elbow release Right 7/04  . TOTAL KNEE ARTHROPLASTY Left 09/10/2012   Procedure: TOTAL KNEE ARTHROPLASTY- left;  Surgeon: Garald Balding, MD;  Location: [facility];  Service: Orthopedics;  Laterality: Left;  Left total knee arthroplasty    OB History    No data available       Home  Medications    Prior to Admission medications   Medication Sig Start Date End Date Taking? Authorizing Provider  acetaminophen (TYLENOL) 325 MG tablet Take 2 tablets (650 mg total) by mouth 2 (two) times daily at 10 AM and 5 PM. 05/01/17   Georgette Shell, MD  bisacodyl (DULCOLAX) 5 MG EC tablet Take 2 tablets (10 mg total) by mouth daily as needed for moderate constipation. 05/09/17   Ghimire, Henreitta Leber, MD  citalopram (CELEXA) 20 MG tablet TAKE ONE (1) TABLET BY MOUTH EVERY DAY Patient taking differently: Take 20mg  by mouth daily 01/22/17   Hoyt Koch, MD  Cranberry-Vitamin C-Vitamin E (CRANBERRY PLUS VITAMIN C PO) Take 1 tablet by mouth daily. 4200 mg    [provider]  cyclobenzaprine (FLEXERIL) 5 MG tablet TAKE ONE (1) TABLET BY MOUTH 3 TIMES DAILY AS NEEDED FOR MUSCLE SPASMS Patient taking differently: Take 5mg   by mouth as needed for muscle spams 04/19/17   Ladell Pier, MD  eletriptan (RELPAX) 40 MG tablet Take 1 tablet (40 mg total) by mouth every 2 (two) hours as needed for migraine. 06/26/16   Dennie Bible, NP  feeding supplement, ENSURE ENLIVE, (ENSURE ENLIVE) LIQD Take 237 mLs by mouth 2 (two) times daily between meals. 05/09/17   Ghimire, Henreitta Leber, MD  insulin detemir (LEVEMIR) 100 unit/ml SOLN Inject 10 Units into the skin at bedtime.    [provider]  insulin lispro (HUMALOG) 100 UNIT/ML injection Inject 2-10 Units into the skin. Sliding Scale    [provider]  lactulose (CHRONULAC) 10 GM/15ML solution Take 30 mLs (20 g total) by mouth daily as needed for mild constipation. 05/09/17   Ghimire, Henreitta Leber, MD  LORazepam (ATIVAN) 1 MG tablet Take 1 tablet (1 mg total) by mouth every 8 (eight) hours as needed for anxiety or sedation (or nausea). 04/27/17   Ladell Pier, MD  LYRICA 200 MG capsule Take 200 mg by mouth daily. 04/03/17   [provider]  morphine (MS CONTIN) 30 MG 12 hr tablet Take 1 tablet (30 mg total) by mouth  every 12 (twelve) hours. 04/27/17   Ladell Pier, MD  multivitamin-lutein [facility]) CAPS capsule Take 1 capsule by mouth daily.    [provider]  nitroGLYCERIN (NITROSTAT) 0.4 MG SL tablet Place 1 tablet (0.4 mg total) under the tongue every 5 (five) minutes as needed for chest pain. 06/28/15   Belva Crome, MD  ondansetron (ZOFRAN) 4 MG tablet Take 1 tablet (4 mg total) by mouth every 6 (six) hours as needed for nausea. 05/09/17   Ghimire, Henreitta Leber, MD  oxybutynin (DITROPAN-XL) 10 MG 24 hr tablet Take 1 tablet (10 mg total) by mouth every morning. 03/19/17   Ladell Pier, MD  oxyCODONE 10 MG TABS Take 1 tablet (10 mg total) by mouth every 6 (six) hours as needed for severe pain or breakthrough pain. 05/09/17   Ghimire, Henreitta Leber, MD  pantoprazole (PROTONIX) 40 MG tablet TAKE ONE (1) TABLET BY MOUTH TWO (2) TIMES DAILY Patient taking differently: Take 40mg  by mouth twice daily 09/25/16   Ladene Artist, MD  polyethylene glycol (MIRALAX / Floria Raveling) packet Take 17 g by mouth 2 (two) times daily. 05/09/17   Ghimire, Henreitta Leber, MD  predniSONE (DELTASONE) 5 MG tablet Take 1 tablet (5 mg total) by mouth 2 (two) times daily. 05/09/17   Ghimire, Henreitta Leber, MD  prochlorperazine (COMPAZINE) 10 MG tablet Take 10 mg by mouth every 6 (six) hours as needed for nausea or vomiting.    [provider]  senna-docusate (SENOKOT-S) 8.6-50 MG tablet Take 2 tablets by mouth at bedtime. 05/09/17   Ghimire, Henreitta Leber, MD  simethicone (MYLICON) 494 MG chewable tablet Chew 125-250 mg by mouth 2 (two) times daily as needed for flatulence.    [provider]  vitamin B-12 (CYANOCOBALAMIN) 1000 MCG tablet Take 2,500 mcg by mouth every morning.    [provider]    Family History Family History  Problem Relation Age of Onset  . Hypertension Mother   . Stroke Mother   . Liver disease Mother        Abcess  . Hypertension Father   . Coronary artery disease Father   . Migraines  Father   . Clotting disorder Father   . Kidney failure Brother   . Hypertension Brother   . Migraines Brother   .  Migraines Daughter   . Breast cancer Other        Niece with breast cancer  . Colon cancer Paternal Grandmother   . Pancreatic cancer Paternal Grandmother   . Stomach cancer Paternal Grandmother   . Breast cancer Cousin   . Esophageal cancer Neg Hx   . Rectal cancer Neg Hx     Social History Social History   Tobacco Use  . Smoking status: Never Smoker  . Smokeless tobacco: Never Used  . Tobacco comment: Secondhand - from family growing up, workplace intermittently  Substance Use Topics  . Alcohol use: Yes    Alcohol/week: 0.0 oz    Comment: occasionally - intermittent, no more than twice a week  . Drug use: No     Allergies   Topamax [topiramate]; Aleve [naproxen sodium]; Bee venom; Echinacea; Other; Sulfa antibiotics; and Advil [ibuprofen]   Review of Systems Review of Systems  Unable to perform ROS: Acuity of condition     Physical Exam Updated Vital Signs BP (!) 101/58 (BP Location: Left Arm)   Pulse (!) 109   Temp (!) 97.3 F (36.3 C) (Rectal)   Ht 5\' 3"  (1.6 m)   Wt 110.7 kg (244 lb)   SpO2 100%   BMI 43.22 kg/m   Physical Exam  Constitutional: She appears well-developed and well-nourished. No distress.  Elderly appearing obese female  HENT:  Head: Normocephalic and atraumatic.  Eyes: Conjunctivae and EOM are normal.  Cardiovascular: Normal rate and regular rhythm.  Pulmonary/Chest: Effort normal and breath sounds normal. No stridor. No respiratory distress.  Abdominal: She exhibits no distension.  Musculoskeletal: She exhibits no edema.  Neurological: No cranial nerve deficit.  Patient moves all extremities spontaneously, does not meaningfully interact with anybody, and appears in discomfort.  Skin: Skin is warm and dry.  Psychiatric: Her mood appears anxious. She is agitated. Thought content is delusional. Cognition and memory are  impaired.  Nursing note and vitals reviewed.    ED Treatments / Results   Procedures Procedures (including critical care time)  Medications Ordered in ED Medications  morphine 4 MG/ML injection 8 mg (not administered)  LORazepam (ATIVAN) injection 1 mg (1 mg Intravenous Given 05/12/17 2000)     Initial Impression / Assessment and Plan / ED Course  I have reviewed the triage vital signs and the nursing notes.  Pertinent labs & imaging results that were available during my care of the patient were reviewed by me and considered in my medical decision making (see chart for details).    Chart review notable for discharge 2 days ago, with concern for ongoing delirium, substantial pain secondary to cancer.    After my initial evaluation I discussed the patient's presentation with her hospice team. They note that in spite of their multiple attempts to increase and adjust the patient's medication, over the past 2 days she has become less calm, more agitated. They are awaiting opening at the inpatient hospice care center.  On repeat exam the patient appears more calm after initial Ativan. Given the patient's inability to receive ongoing appropriate comfort care at home, I discussed her case with our hospitalist team here.   Final Clinical Impressions(s) / ED Diagnoses  delerium   Carmin Muskrat, MD 05/12/17 2218

## 2017-05-12 NOTE — ED Notes (Signed)
Bed: DG64 Expected date: 05/12/17 Expected time: 7:02 PM Means of arrival: Ambulance Comments: Hospice pt, Pancreatic CA Pt agitated

## 2017-05-13 ENCOUNTER — Other Ambulatory Visit: Payer: Self-pay

## 2017-05-13 DIAGNOSIS — C787 Secondary malignant neoplasm of liver and intrahepatic bile duct: Secondary | ICD-10-CM

## 2017-05-13 DIAGNOSIS — C259 Malignant neoplasm of pancreas, unspecified: Secondary | ICD-10-CM

## 2017-05-13 LAB — GLUCOSE, CAPILLARY: GLUCOSE-CAPILLARY: 109 mg/dL — AB (ref 65–99)

## 2017-05-13 MED ORDER — LORAZEPAM 2 MG/ML IJ SOLN
2.0000 mg | Freq: Once | INTRAMUSCULAR | Status: AC
  Administered 2017-05-13: 2 mg via INTRAVENOUS
  Filled 2017-05-13: qty 1

## 2017-05-13 MED ORDER — MORPHINE SULFATE (PF) 4 MG/ML IV SOLN: 4.0000 mg | INTRAVENOUS | Status: DC | PRN

## 2017-05-13 MED ORDER — LORAZEPAM 2 MG/ML IJ SOLN
1.0000 mg | INTRAMUSCULAR | Status: DC | PRN
  Administered 2017-05-13 – 2017-05-15 (×7): 1 mg via INTRAVENOUS
  Filled 2017-05-13 (×7): qty 1

## 2017-05-13 MED ORDER — SODIUM CHLORIDE 0.9 % IV SOLN
6.0000 mg/h | INTRAVENOUS | Status: DC
  Administered 2017-05-13: 5 mg/h via INTRAVENOUS
  Administered 2017-05-15: 6 mg/h via INTRAVENOUS
  Filled 2017-05-13 (×2): qty 10

## 2017-05-13 MED ORDER — HALOPERIDOL LACTATE 5 MG/ML IJ SOLN
5.0000 mg | Freq: Once | INTRAMUSCULAR | Status: AC
  Administered 2017-05-13: 5 mg via INTRAVENOUS
  Filled 2017-05-13: qty 1

## 2017-05-13 MED ORDER — MORPHINE BOLUS VIA INFUSION
4.0000 mg | INTRAVENOUS | Status: DC | PRN
  Administered 2017-05-13 – 2017-05-14 (×5): 4 mg via INTRAVENOUS
  Filled 2017-05-13: qty 4

## 2017-05-13 MED ORDER — ACETAMINOPHEN 650 MG RE SUPP
650.0000 mg | RECTAL | Status: DC | PRN
  Administered 2017-05-14 – 2017-05-15 (×2): 650 mg via RECTAL
  Filled 2017-05-13 (×2): qty 1

## 2017-05-13 MED ORDER — MORPHINE SULFATE (PF) 4 MG/ML IV SOLN
4.0000 mg | Freq: Once | INTRAVENOUS | Status: AC
  Administered 2017-05-13: 4 mg via INTRAVENOUS

## 2017-05-13 MED ORDER — SCOPOLAMINE 1 MG/3DAYS TD PT72
1.0000 | MEDICATED_PATCH | Freq: Once | TRANSDERMAL | Status: DC
  Administered 2017-05-13: 1.5 mg via TRANSDERMAL
  Filled 2017-05-13: qty 1

## 2017-05-13 NOTE — Care Management (Signed)
[place] ([facility]) GIP RN Visit - [facility] [phone]  Code Status DNR  PPS 30%  - What led to hospitalization Patient family decided on ED evaluation due to uncontrolled pain, increased delirium, and increased agitation at home although with hospice services and attempt for management at home.  ED evaluation on 05/12/17 with subsequent admission.  - What was tried in previous setting that was ineffective Pain management medications as well as antianxiety medications and medications for agitation management  - Care needs caregiver cannot manage at home Patients spouse reports to me on today that he was not able to handle her increased agitation, yelling, and she got to the point where he could not administer medications/  He notes this was the most pivotal for him.  - Admission diagnosis Severe pain and AMS  - Day of hospital GIP LOS Today is day #1  - Specific symptoms being actively managed Pain and agitation  - Services being provided and response to treatment RN and MD - Ativan 2mg  at 0915 and Morphine 5mg /hr IVF continuous; symptoms well managed at this time  - Goals of Care Comfort and per plan of care; awaiting [place] placement at this time  - Discharge planning Time/date not yet determined; goal is for [facility] placement  - Communication to [place] support and answered any questions on today as he was present along with patients daughter  - Communication to [place] is to discharge to [facility] and not home with family.  Continued evaluation and assessment by hospice staff to continue  Please call Hospice and [facility] with any questions.    Claire Shown. Mingo Amber RN MSN [phone] [facility] On Call RN

## 2017-05-13 NOTE — Progress Notes (Signed)
Patient Demographics:    Heather Frederick, is a 62 y.o. female, DOB - 1955/07/25, MGN:[id number]  Admit date - 05/12/2017   Admitting Physician Elwyn Reach, MD  Outpatient Primary MD for the patient is [name], MD  LOS - 1   Chief Complaint  Patient presents with  . Altered Mental Status        Subjective:    Heather Frederick today has no fevers, no emesis, more sleepy, less agitated after IV morphine and lorazepam  Assessment  & Plan :    Principal Problem:   Pancreatic carcinoma metastatic to liver [facility]) Active Problems:   Altered mental status   Type 2 diabetes mellitus with complication [facility])   Pancreatic cancer [facility])  62 y.o. female with medical history significant of metastatic pancreatic cancer to the liver with morbid obesity/OSA who was under hospice care at home  Admitted on 05/12/17 with increased pain,  delirium and agitation. Plan is  to move patient to [facility] place when a bed is available.     1)Metastatic Pancreatic Ca-plan discussed with patient's husband and daughter at bedside, patient is actively dying, IV morphine drip at 5 mg/h started, lorazepam IV as needed, IV morphine sulfate as needed breakthrough pain.  Awaiting transfer to [facility] place hospice home  Code Status : DNR   Disposition Plan  : [place] hospice home  Consults  :  Hospice   Lab Results  Component Value Date   PLT 175 05/09/2017    Inpatient Medications  Scheduled Meds: . feeding supplement (ENSURE ENLIVE)  237 mL Oral BID BM  . scopolamine  1 patch Transdermal Once   Continuous Infusions: . sodium chloride 10 mL/hr at 05/13/17 0948  . morphine 5 mg/hr (05/13/17 0948)   PRN Meds:.acetaminophen, LORazepam, morphine   Anti-infectives (From admission, onward)   None        Objective:   Vitals:   05/12/17 2100 05/12/17 2200 05/13/17 0017 05/13/17 0523  BP: (!) 104/52 114/63 (!)  142/54 123/75  Pulse: 96 96 (!) 120 (!) 144  Resp: 20 20 20 20   Temp:   98.8 F (37.1 C) 98.2 F (36.8 C)  TempSrc:   Axillary Oral  SpO2: 97% 97% 94% 95%  Weight:   109.8 kg (242 lb)   Height:   5\' 3"  (1.6 m)     Wt Readings from Last 3 Encounters:  05/13/17 109.8 kg (242 lb)  05/07/17 110.7 kg (244 lb)  05/04/17 110.9 kg (244 lb 9.6 oz)     Intake/Output Summary (Last 24 hours) at 05/13/2017 1437 Last data filed at 05/13/2017 0344 Gross per 24 hour  Intake 169.17 ml  Output -  Net 169.17 ml     Physical Exam  Gen:-More peaceful, resting better after IV morphine  HEENT:- [place].AT, No sclera icterus Neck-Supple Neck,No JVD,.  Lungs-  CTAB  CV- S1, S2 normal Abd-  +ve B.Sounds, Abd Soft, No tenderness,    Extremity/Skin:- No  edema,    Psych-sleepy, less agitated    Data Review:   Micro Results Recent Results (from the past 240 hour(s))  Culture, Blood     Status: None   Collection Time: 05/04/17 11:40 AM  Result Value Ref Range Status   Specimen  Description BLOOD PORTA CATH  Final   Special Requests   Final    BOTTLES DRAWN AEROBIC AND ANAEROBIC Blood Culture adequate volume   Culture   Final    NO GROWTH 5 DAYS Performed at [facility], [address], [place], [place] [address]    Report Status 05/09/2017 FINAL  Final  Urine Culture     Status: Abnormal   Collection Time: 05/04/17 12:16 PM  Result Value Ref Range Status   Specimen Description   Final    URINE, CLEAN CATCH Performed at [facility], [place] [address] [place], [place] [address]    Special Requests   Final    NONE Performed at [facility], [place] [address] [place], [place] [address]    Culture MULTIPLE SPECIES PRESENT, SUGGEST RECOLLECTION (A)  Final   Report Status 05/05/2017 FINAL  Final    Radiology Reports Dg Chest 2 View  Result Date: 04/24/2017 CLINICAL DATA:  Shortness of breath and abdominal pain today. Chills. EXAM:  CHEST  2 VIEW COMPARISON:  04/20/2017 FINDINGS: Power port type central venous catheter with tip in the mid SVC region. No pneumothorax. Shallow inspiration with elevation of the hemidiaphragms. Cardiac enlargement with mild vascular congestion. Diffuse interstitial pattern to the lungs is increasing since previous study suggesting increasing edema. Linear atelectasis in the lung bases. No pneumothorax. Mediastinal contours appear intact. IMPRESSION: Shallow inspiration with elevation of the hemidiaphragms and atelectasis in the lung bases. Cardiac enlargement with pulmonary vascular congestion and increasing interstitial edema since previous study. Electronically Signed   By: Lucienne Capers M.D.   On: 04/24/2017 23:50   Dg Chest 2 View  Result Date: 04/20/2017 CLINICAL DATA:  History of metastatic pancreatic cancer. Low blood pressure. EXAM: CHEST  2 VIEW COMPARISON:  04/19/2017 FINDINGS: Stable right-sided injectable port. Cardiomediastinal silhouette is normal. Mediastinal contours appear intact. There is no evidence of focal airspace consolidation, pleural effusion or pneumothorax. Chronic elevation of the hemidiaphragms with atelectatic changes at the lung bases. Osseous structures are without acute abnormality. Soft tissues are grossly normal. IMPRESSION: Low lung volume with chronic elevation of the hemidiaphragms and atelectatic changes at the lung bases. Electronically Signed   By: Fidela Salisbury M.D.   On: 04/20/2017 12:17   Dg Chest 2 View  Result Date: 04/19/2017 CLINICAL DATA:  Hypotension and history of pancreatic carcinoma EXAM: CHEST  2 VIEW COMPARISON:  04/13/2017 FINDINGS: Right-sided chest wall port is again seen. Elevation the right hemidiaphragm is again noted. The lungs are well aerated bilaterally. Minimal right basilar atelectasis is again seen. No new focal abnormality is noted. IMPRESSION: Stable right basilar atelectasis. Electronically Signed   By: Inez Catalina M.D.   On:  04/19/2017 20:49   Dg Chest 2 View  Result Date: 04/13/2017 CLINICAL DATA:  Shortness of breath. EXAM: CHEST  2 VIEW COMPARISON:  Radiograph of February 23, 2017. FINDINGS: Stable cardiomediastinal silhouette. No pneumothorax is noted. Elevated right hemidiaphragm is noted with mild right basilar subsegmental atelectasis. Right internal jugular Port-A-Cath is noted with distal tip in expected position of the SVC. Left lung is clear. No significant pleural effusion is noted. Bony thorax is unremarkable. IMPRESSION: Elevated right hemidiaphragm with mild right basilar subsegmental atelectasis. No other significant abnormality is noted. Electronically Signed   By: Marijo Conception, M.D.   On: 04/13/2017 14:56   Dg Abd 1 View  Result Date: 05/01/2017 CLINICAL DATA:  Nausea. History of metastatic pancreatic malignancy. EXAM:  ABDOMEN - 1 VIEW COMPARISON:  Abdominal radiographs of May 27, 2017 FINDINGS: The colonic stool and gas pattern is similar to that seen yesterday. There is no evidence of obstruction or significant ileus. There is no significant rectal gas. IMPRESSION: No evidence of bowel obstruction.  Moderate colonic stool burden. Electronically Signed   By: David  Martinique M.D.   On: 05/01/2017 07:15   Dg Abd 1 View  Result Date: 04/29/2017 CLINICAL DATA:  Constipation for the past 2-3 days. History of pancreatic cancer. EXAM: ABDOMEN - 1 VIEW COMPARISON:  04/28/2017; 04/25/2017; CT abdomen and pelvis - 04/28/2017 FINDINGS: Moderate colonic stool burden without evidence of enteric obstruction. Nondiagnostic evaluation for pneumoperitoneum secondary to supine patient positioning and exclusion of the lower thorax. Surgical mesh overlies the right lower abdomen. Post cholecystectomy. No acute osseus abnormalities. Stigmata of DISH within the lower thoracic spine. IMPRESSION: Moderate colonic stool burden without evidence of enteric obstruction. Electronically Signed   By: Sandi Mariscal M.D.   On: 04/29/2017  10:58   Ct Angio Chest Pe W And/or Wo Contrast  Result Date: 04/28/2017 CLINICAL DATA:  Weakness and shortness of breath. History of pancreatic cancer with metastatic disease. EXAM: CT ANGIOGRAPHY CHEST WITH CONTRAST TECHNIQUE: Multidetector CT imaging of the chest was performed using the standard protocol during bolus administration of intravenous contrast. Multiplanar CT image reconstructions and MIPs were obtained to evaluate the vascular anatomy. CONTRAST:  100 mL ISOVUE-370 IOPAMIDOL (ISOVUE-370) INJECTION 76% COMPARISON:  04/13/2017 CT chest. FINDINGS: Cardiovascular: Satisfactory opacification of the pulmonary arteries to the segmental level. No evidence of pulmonary embolism. Normal heart size. No pericardial effusion. Mediastinum/Nodes: No enlarged mediastinal, hilar, or axillary lymph nodes. Thyroid gland, trachea, and esophagus demonstrate no significant findings. Port-A-Cath good position. Lungs/Pleura: Unchanged 6 mm RIGHT upper lobe nodule. Upper Abdomen: Hepatic metastatic disease appears similar to priors. Pancreatic head and body mass redemonstrated. Musculoskeletal: No chest wall abnormality. No acute or significant osseous findings. Review of the MIP images confirms the above findings. IMPRESSION: No evidence for pulmonary emboli. No acute cardiopulmonary findings. Hepatic metastatic disease from pancreatic cancer. Study is insufficient characterize for improvement or progression. Electronically Signed   By: Staci Righter M.D.   On: 04/28/2017 15:51   Ct Angio Chest Pe W/cm &/or Wo Cm  Result Date: 04/13/2017 CLINICAL DATA:  Pancreatic carcinoma. Referral cancer center for hypotension and malaise. Chills and nausea and vomiting. Chronic RIGHT-sided abdominal pain. EXAM: CT ANGIOGRAPHY CHEST CT ABDOMEN AND PELVIS WITH CONTRAST TECHNIQUE: Multidetector CT imaging of the chest was performed using the standard protocol during bolus administration of intravenous contrast. Multiplanar CT image  reconstructions and MIPs were obtained to evaluate the vascular anatomy. Multidetector CT imaging of the abdomen and pelvis was performed using the standard protocol during bolus administration of intravenous contrast. CONTRAST:  15mL ISOVUE-370 IOPAMIDOL (ISOVUE-370) INJECTION 76% COMPARISON:  CT chest abdomen pelvis 03/11/2017 FINDINGS: CTA CHEST FINDINGS Cardiovascular: No filling defects within the pulmonary arteries arteries to suggest acute pulmonary embolism. No significant vascular findings. Normal heart size. No pericardial effusion. Mediastinum/Nodes: No axillary supraclavicular adenopathy. Port in the RIGHT chest wall with tip in distal SVC. No mediastinal hilar adenopathy. No pericardial fluid. Esophagus normal. Lungs/Pleura: 6 mm RIGHT upper lobe pulmonary nodule is not changed compared to 03/11/2017. Lesions increased in size from 12/06/2016 no new pulmonary nodules. Musculoskeletal: No aggressive osseous lesion Review of the MIP images confirms the above findings. CT ABDOMEN and PELVIS FINDINGS Hepatobiliary: Multiple round low-density lesions in liver parenchyma consistent with hepatic  metastasis are not changed in short interval from 03/11/2017. No new lesions. Postcholecystectomy. Pancreas: Lesion head of pancreas measures smaller at 3.7 by 4.9 cm compared to 4.5 x 5.2 cm. There is atrophy duct dilatation the body and tail the pancreas. Spleen: Geographic low attenuation within the medial aspect the spleen is likely a profusion phenomena (image 34, series 4). Adrenals/urinary tract: Adrenal glands and kidneys are normal. The ureters and bladder normal. Stomach/Bowel: Stomach, small bowel, appendix, and cecum are normal. The colon and rectosigmoid colon are normal. Vascular/Lymphatic: Abdominal aorta is normal caliber. There is no retroperitoneal or periportal lymphadenopathy. No pelvic lymphadenopathy. Reproductive: Post hysterectomy. Other: No peritoneal nodularity Musculoskeletal: No aggressive  osseous lesion. Review of the MIP images confirms the above findings. IMPRESSION: Chest Impression: 1. No evidence acute pulmonary embolism. 2. Suspicious RIGHT upper lobe pulmonary nodule not changed in short interval from 03/11/2017. Abdomen / Pelvis Impression: 1. Interval decrease in size of pancreatic mass in short interval follow-up. 2. Stable bilobed hepatic metastasis. 3. No peritoneal metastatic disease or progressive adenopathy. Electronically Signed   By: Suzy Bouchard M.D.   On: 04/13/2017 17:55   Mr Jeri Cos WU Contrast  Result Date: 04/30/2017 CLINICAL DATA:  62 y/o F; metastatic pancreatic cancer presenting with nausea, lightheadedness, fatigue, and shortness of breath. EXAM: MRI HEAD WITHOUT AND WITH CONTRAST TECHNIQUE: Multiplanar, multiecho pulse sequences of the brain and surrounding structures were obtained without and with intravenous contrast. CONTRAST:  53mL MULTIHANCE GADOBENATE DIMEGLUMINE 529 MG/ML IV SOLN COMPARISON:  02/05/2017 MRI of the head. FINDINGS: Brain: Moderate motion artifact degrades multiple sequences. No acute infarction, hemorrhage, hydrocephalus, extra-axial collection or mass lesion. Cavum septum pellucidum. After administration of intravenous contrast there is NO abnormal enhancement of the brain Vascular: Normal flow voids. Skull and upper cervical spine: Normal marrow signal. Sinuses/Orbits: Negative. Other: None. IMPRESSION: Stable normal for age MRI of the brain.  Moderate motion artifact. Electronically Signed   By: Kristine Garbe M.D.   On: 04/30/2017 19:22   Ct Abdomen Pelvis W Contrast  Result Date: 04/28/2017 CLINICAL DATA:  Progressive abdominal pain with history of pancreatic cancer. EXAM: CT ABDOMEN AND PELVIS WITH CONTRAST TECHNIQUE: Multidetector CT imaging of the abdomen and pelvis was performed using the standard protocol following bolus administration of intravenous contrast. CONTRAST:  80 cc Isovue 300 intravenously. COMPARISON:   Abdominal CT 04/13/2017 FINDINGS: Lower chest: Atelectatic changes in the right lung base. Partially visualized 6 mm right upper lobe pulmonary nodule. Hepatobiliary: No significant change in numerous bilateral hepatic metastatic lesions. Pancreas: Decreased in size hypoattenuated body of the pancreas malignancy, now measuring approximately 4.0 by 2.5 cm. Persistent dilation of the downstream main pancreatic duct and atrophy of the tail of the pancreas. Persistent peripancreatic fat stranding. Small peripancreatic lymph nodes appear stable. Spleen: Normal in size without focal abnormality. Adrenals/Urinary Tract: Adrenal glands are unremarkable. Kidneys are normal, without renal calculi, focal lesion, or hydronephrosis. Bladder is unremarkable. Stomach/Bowel: Stomach is within normal limits. Appendix appears normal. No evidence of bowel wall thickening, distention, or inflammatory changes. Vascular/Lymphatic: Mild upper abdominal retroperitoneal lymphadenopathy. No significant vascular findings. Reproductive: Status post hysterectomy. No adnexal masses. Other: No abdominal wall hernia or abnormality. No abdominopelvic ascites. Prior ventral hernia repair. Musculoskeletal: No acute or significant osseous findings. IMPRESSION: Suboptimal timing of contrast administration. Interval mild decrease in size of pancreatic body malignancy. Mild upper abdominal retroperitoneal lymphadenopathy. No significant change in widespread hepatic metastatic disease. Electronically Signed   By: Fidela Salisbury M.D.   On:  04/28/2017 17:39   Ct Abdomen Pelvis W Contrast  Result Date: 04/13/2017 CLINICAL DATA:  Pancreatic carcinoma. Referral cancer center for hypotension and malaise. Chills and nausea and vomiting. Chronic RIGHT-sided abdominal pain. EXAM: CT ANGIOGRAPHY CHEST CT ABDOMEN AND PELVIS WITH CONTRAST TECHNIQUE: Multidetector CT imaging of the chest was performed using the standard protocol during bolus administration of  intravenous contrast. Multiplanar CT image reconstructions and MIPs were obtained to evaluate the vascular anatomy. Multidetector CT imaging of the abdomen and pelvis was performed using the standard protocol during bolus administration of intravenous contrast. CONTRAST:  147mL ISOVUE-370 IOPAMIDOL (ISOVUE-370) INJECTION 76% COMPARISON:  CT chest abdomen pelvis 03/11/2017 FINDINGS: CTA CHEST FINDINGS Cardiovascular: No filling defects within the pulmonary arteries arteries to suggest acute pulmonary embolism. No significant vascular findings. Normal heart size. No pericardial effusion. Mediastinum/Nodes: No axillary supraclavicular adenopathy. Port in the RIGHT chest wall with tip in distal SVC. No mediastinal hilar adenopathy. No pericardial fluid. Esophagus normal. Lungs/Pleura: 6 mm RIGHT upper lobe pulmonary nodule is not changed compared to 03/11/2017. Lesions increased in size from 12/06/2016 no new pulmonary nodules. Musculoskeletal: No aggressive osseous lesion Review of the MIP images confirms the above findings. CT ABDOMEN and PELVIS FINDINGS Hepatobiliary: Multiple round low-density lesions in liver parenchyma consistent with hepatic metastasis are not changed in short interval from 03/11/2017. No new lesions. Postcholecystectomy. Pancreas: Lesion head of pancreas measures smaller at 3.7 by 4.9 cm compared to 4.5 x 5.2 cm. There is atrophy duct dilatation the body and tail the pancreas. Spleen: Geographic low attenuation within the medial aspect the spleen is likely a profusion phenomena (image 34, series 4). Adrenals/urinary tract: Adrenal glands and kidneys are normal. The ureters and bladder normal. Stomach/Bowel: Stomach, small bowel, appendix, and cecum are normal. The colon and rectosigmoid colon are normal. Vascular/Lymphatic: Abdominal aorta is normal caliber. There is no retroperitoneal or periportal lymphadenopathy. No pelvic lymphadenopathy. Reproductive: Post hysterectomy. Other: No peritoneal  nodularity Musculoskeletal: No aggressive osseous lesion. Review of the MIP images confirms the above findings. IMPRESSION: Chest Impression: 1. No evidence acute pulmonary embolism. 2. Suspicious RIGHT upper lobe pulmonary nodule not changed in short interval from 03/11/2017. Abdomen / Pelvis Impression: 1. Interval decrease in size of pancreatic mass in short interval follow-up. 2. Stable bilobed hepatic metastasis. 3. No peritoneal metastatic disease or progressive adenopathy. Electronically Signed   By: Suzy Bouchard M.D.   On: 04/13/2017 17:55   Ir Removal [employer] Access W/ Port W/o Fl Mod Sed  Result Date: 05/08/2017 CLINICAL DATA:  End-stage metastatic pancreatic carcinoma and unexplained fever with possibility that indwelling port is a source bacteremia. Request has been made to remove the port. EXAM: REMOVAL OF IMPLANTED TUNNELED PORT-A-CATH MEDICATIONS: Moderate (conscious) sedation was employed during this procedure. A total of Versed 0.5 mg and Fentanyl 25 mcg was administered intravenously. Moderate Sedation Time: 20 minutes. The patient's level of consciousness and vital signs were monitored continuously by radiology nursing throughout the procedure under my direct supervision. PROCEDURE: The right chest Port-A-Cath site was prepped with chlorhexidine. A sterile gown and gloves were worn during the procedure. Local anesthesia was provided with 1% lidocaine. An incision was made overlying the Port-A-Cath with a #15 scalpel. Utilizing sharp and blunt dissection, the Port-A-Cath was removed. Portable cautery was utilized. Retention sutures were removed. The pocked was irrigated with sterile saline. Wound closure was performed with subcutaneous 3-0 Monocryl, subcuticular 4-0 Vicryl and Dermabond. The entire Port-A-Cath was removed successfully. IMPRESSION: Removal of implanted Port-A-Cath utilizing sharp and blunt  dissection. The procedure was uncomplicated. Electronically Signed   By: Aletta Edouard  M.D.   On: 05/08/2017 16:03   Dg Chest Port 1v Same Day  Result Date: 05/06/2017 CLINICAL DATA:  Weakness and shortness of breath. EXAM: PORTABLE CHEST 1 VIEW COMPARISON:  04/28/2017 CT, 04/24/2017 radiographs and prior studies. FINDINGS: Cardiomegaly and right Port-A-Cath with tip overlying the mid SVC again noted. Right hemithorax volume loss and mild right basilar atelectasis/scarring again noted. There is no evidence of airspace disease, pleural effusion or pneumothorax. No acute bony abnormalities are present. IMPRESSION: Unchanged appearance of the chest with mild right basilar atelectasis/scarring. Electronically Signed   By: Margarette Canada M.D.   On: 05/06/2017 07:54   Dg Abd Portable 1v  Result Date: 04/25/2017 CLINICAL DATA:  Abdominal pain EXAM: PORTABLE ABDOMEN - 1 VIEW COMPARISON:  CT abdomen and pelvis 04/13/2017 FINDINGS: Prior ventral hernia repair in pelvis. Surgical clips RIGHT upper quadrant related to prior cholecystectomy. Nonobstructive bowel gas pattern. No bowel dilatation or bowel wall thickening. Bones demineralized. Small pelvic phleboliths without definite urinary tract calcification. IMPRESSION: Normal bowel gas pattern. Electronically Signed   By: Lavonia Dana M.D.   On: 04/25/2017 15:37   Dg Abd Portable 2 Views  Result Date: 04/28/2017 CLINICAL DATA:  Vomiting EXAM: PORTABLE ABDOMEN - 2 VIEW COMPARISON:  04/25/2017 FINDINGS: The bowel gas pattern is normal. There is no evidence of free air. No radio-opaque calculi or other significant radiographic abnormality is seen. IMPRESSION: Negative. Electronically Signed   By: Kathreen Devoid   On: 04/28/2017 14:17     CBC Recent Labs  Lab 05/07/17 0607 05/08/17 0609 05/09/17 0552  WBC 16.3* 13.1* 11.9*  HGB 8.9* 8.4* 8.5*  HCT 28.1* 26.5* 27.2*  PLT 178 167 175  MCV 88.4 89.5 90.1  MCH 28.0 28.4 28.1  MCHC 31.7 31.7 31.3  RDW 19.0* 18.8* 18.8*  LYMPHSABS  --  1.4 1.1  MONOABS  --  1.9* 1.3*  EOSABS  --  0.4 0.1    BASOSABS  --  0.0 0.0    Chemistries  Recent Labs  Lab 05/07/17 0607 05/08/17 0609 05/09/17 0552  NA 132* 134* 137  K 3.6 4.1 4.1  CL 96* 97* 99*  CO2 24 26 26   GLUCOSE 135* 101* 128*  BUN 5* 7 8  CREATININE 0.59 0.54  0.56 0.58  CALCIUM 9.9 9.7 11.0*  MG 1.5* 2.0  --   AST 41  --   --   ALT 25  --   --   ALKPHOS 250*  --   --   BILITOT 1.2  --   --    ------------------------------------------------------------------------------------------------------------------ No results for input(s): CHOL, HDL, LDLCALC, TRIG, CHOLHDL, LDLDIRECT in the last 72 hours.  Lab Results  Component Value Date   HGBA1C 7.2 (H) 09/29/2016   ------------------------------------------------------------------------------------------------------------------ No results for input(s): TSH, T4TOTAL, T3FREE, THYROIDAB in the last 72 hours.  Invalid input(s): FREET3 ------------------------------------------------------------------------------------------------------------------ No results for input(s): VITAMINB12, FOLATE, FERRITIN, TIBC, IRON, RETICCTPCT in the last 72 hours.  Coagulation profile Recent Labs  Lab 05/08/17 0609  INR 1.85    No results for input(s): DDIMER in the last 72 hours.  Cardiac Enzymes No results for input(s): CKMB, TROPONINI, MYOGLOBIN in the last 168 hours.  Invalid input(s): CK ------------------------------------------------------------------------------------------------------------------ No results found for: BNP   Roxan Hockey M.D on 05/13/2017 at 2:37 PM  Between 7am to 7pm - Pager - [phone]  After 7pm go to [email or web address] - password [email or web address]  [facility] -  Office  [phone]   Voice Recognition Viviann Spare dictation system was used to create this note, attempts have been made to correct errors. Please contact the author with questions and/or clarifications.

## 2017-05-13 NOTE — Progress Notes (Signed)
CSW acknowledging consult for placement at [facility]. CSW contacted Harmon Pier at [facility]; Admissions is aware of the patient's referral already as she is already serviced by [place]. No beds available at the moment, but CSW will continue to follow for when bed becomes available.  Laveda Abbe, [place] Clinical Social Worker [phone]

## 2017-05-14 ENCOUNTER — Telehealth: Payer: Self-pay | Admitting: Medical Oncology

## 2017-05-14 DIAGNOSIS — C251 Malignant neoplasm of body of pancreas: Principal | ICD-10-CM

## 2017-05-14 DIAGNOSIS — R4182 Altered mental status, unspecified: Secondary | ICD-10-CM

## 2017-05-14 LAB — COMPREHENSIVE METABOLIC PANEL
ALT: 30 U/L (ref 14–54)
AST: 76 U/L — ABNORMAL HIGH (ref 15–41)
Albumin: 2.5 g/dL — ABNORMAL LOW (ref 3.5–5.0)
Alkaline Phosphatase: 441 U/L — ABNORMAL HIGH (ref 38–126)
Anion gap: 16 — ABNORMAL HIGH (ref 5–15)
BUN: 23 mg/dL — ABNORMAL HIGH (ref 6–20)
CO2: 25 mmol/L (ref 22–32)
Calcium: 12.5 mg/dL — ABNORMAL HIGH (ref 8.9–10.3)
Chloride: 103 mmol/L (ref 101–111)
Creatinine, Ser: 0.92 mg/dL (ref 0.44–1.00)
GFR calc Af Amer: >60 mL/min (ref >60)
GFR calc non Af Amer: >60 mL/min (ref >60)
Glucose, Bld: 133 mg/dL — ABNORMAL HIGH (ref 65–99)
Potassium: 4.3 mmol/L (ref 3.5–5.1)
Sodium: 144 mmol/L (ref 135–145)
Total Bilirubin: 3.1 mg/dL — ABNORMAL HIGH (ref 0.3–1.2)
Total Protein: 6.7 g/dL (ref 6.5–8.1)

## 2017-05-14 MED ORDER — SODIUM CHLORIDE 0.9 % IV BOLUS (SEPSIS)
500.0000 mL | Freq: Once | INTRAVENOUS | Status: AC
  Administered 2017-05-14: 500 mL via INTRAVENOUS

## 2017-05-14 MED ORDER — ZOLEDRONIC ACID 4 MG/5ML IV CONC
4.0000 mg | Freq: Once | INTRAVENOUS | Status: AC
  Administered 2017-05-14: 4 mg via INTRAVENOUS
  Filled 2017-05-14: qty 5

## 2017-05-14 NOTE — Progress Notes (Signed)
Nutrition Brief Note  Pt identified as at nutrition risk on the Malnutrition Screen Tool.   Chart reviewed. Pt transitioning to comfort care.  No nutrition interventions warranted at this time.   Clayton Bibles, MS, RD, [place] Dietitian Pager: [phone] After Hours Pager: [phone]

## 2017-05-14 NOTE — Progress Notes (Signed)
IP PROGRESS NOTE  Subjective:   Heather Frederick was discharged home with hospice care 05/09/2017.  Her husband reports she became increasingly confused and agitated beginning 05/11/2017.  Her symptoms progressed and he was unable to care for her in the home despite help from the home hospice RN.Marland Kitchen  She was brought to the emergency room yesterday and is waiting on a [facility] bed. Heather Frederick reports she had a fever of 101 degrees at home.  We were called by Heather Frederick with a report of her admission. Objective: Vital signs in last 24 hours: Blood pressure 112/89, pulse (!) 135, temperature 98.3 F (36.8 C), temperature source Axillary, resp. rate 20, height '5\' 3"'  (1.6 m), weight 242 lb (109.8 kg), SpO2 94 %.  Intake/Output from previous day: 02/17 0701 - 02/18 0700 In: 382.8 [I.V.:382.8] Out: 1600 [Urine:1600]  Physical Exam:   Lungs: Clear posteriorly, no respiratory distress Cardiac: Regular rate and rhythm, tachycardia Extremities: No leg edema Neurologic: Opens eyes, moaning, not following commands  Portacath site incision is healed   Medications: I have reviewed the patient's current medications.  Assessment/Plan: 1. Metastatic pancreas cancer ? Pancreas body mass and liver metastases noted on CT abdomen/pelvis 08/11/2016 ? Ultrasound-guided biopsy of a right liver lesion 08/16/2016 revealed poorly differentiated adenocarcinoma consistent with pancreas cancer ? Foundation 1-microsatellite stable; tumor mutational burden 1; ERBB2 amplification ? Cycle 1 gemcitabine/Abraxane 09/13/2016; 09/29/2016 ? Cycle 2 gemcitabine/Abraxane 10/11/2016, 10/18/2016 ? Cycle 3 gemcitabine/Abraxane 11/01/2016, 11/08/2016 ? Cycle 4 gemcitabine/Abraxane 11/23/2016,11/29/2016 ? CT chest 12/06/2016-liver lesions appear smaller ? Cycle 5 gemcitabine/Abraxane 12/13/2016 ? CT abdomen/pelvis 01/01/2017-new and enlarging hepatic masses. Enlarging pancreatic mass. ? Cycle 6 gemcitabine/Abraxane  01/03/2017 ? Rising CA 19-9, increased pain October 2018 ? Cycle 1 FOLFOX 01/31/2017 ? Cycle 2 FOLFOX 02/19/2017 ? Cycle 3FOLFIRINOX 03/06/2017 ? CT 03/11/2017 (compared to 01/01/2017) increased liver lesions and enlargement of the pancreas mass ? Cycle4FOLFIRINOX 03/21/2017 (oxaliplatin dose reduced and Neulasta added) ? Cycle 5 FOLFIRINOX 04/09/2017 ? CT 04/13/2017-decrease in the size of the pancreas mass, no change in multiple liver lesions  2. Pain secondary to pancreas cancer, MS Contin added 03/13/2017  3. Hypertension  4. Sleep apnea  5. Chronic low back pain  6. Recurrent urinary tract infections  7. Depression  8. Migraine headaches  9. Port-A-Cath placement 08/28/2016  10. Rash following cycle 1 gemcitabine/Abraxane-drug rash?  11. Nausea/vomiting following cycle 1 gemcitabine/Abraxane-antiemetic regimen adjusted with addition of Aloxi  12. Diabetes  13. Right cephalic vein thrombosis 16/12/[id number] with Xarelto  14. CT chest 12/06/2016 done to evaluate dyspnea-several 3-4 mm nodular opacities in the lung parenchyma etiology uncertain. Known mass in the body of the pancreas measuring 3.3 x 2.4 cm;small enhancing lesion in the anterior dome of the liver measures 8 mm.  15. Right forearm rash following cycle 5 day 8 gemcitabine/Abraxane. Resolved  16.Klebsiella urinary tract infection 03/14/2017  17.Admission 04/13/2017 with diaphoresis and tachycardia-resolved, 1 blood culture positive for a coagulase-negative Staphylococcus  18. Admission 04/19/2017 with recurrent hypotension  19. Admission 04/24/2017 with recurrent hypotension and diaphoresis  20.Admission 04/28/2017 with diaphoresis, fever, and weakness  21.  Admission 05/04/2017 with altered mental status and fever  Port-A-Cath removal 05/08/2017  Trial of low-dose prednisone for "tumor fever "beginning 05/08/2017   Heather Frederick was admitted with progressive  confusion, agitation, and pain.  She is not following commands this afternoon.  Multiple family members including her husband are at the bedside.  I discussed the poor prognosis with him.  They understand regardless of the  subacute decline her prognosis for survival beyond months was poor.  I suspect the altered mental status is related to delirium from critical illness and possibly hepatic encephalopathy.  She may have unrecognized CNS involvement with tumor.  The differential diagnosis includes a systemic infection.  The calcium was mildly elevated on 05/09/2017.  This could account for her being more confused and obtunded.  I think it is reasonable to check the calcium and consider treatment to allow for a transition to [facility] and time for her family to adjust.   Recommendations: 1.  Check a one-time calcium level and if markedly elevated consider a dose of biphosphonate therapy 2.  Continue narcotic and anxiolytic medications 3.  Transfer to [facility]        LOS: 2 days   Heather Coder, MD   05/14/2017, 2:57 PM

## 2017-05-14 NOTE — Progress Notes (Signed)
Patient Demographics:    Heather Frederick, is a 62 y.o. female, DOB - 09-16-1955, OAC:[id number]  Admit date - 05/12/2017   Admitting Physician Elwyn Reach, MD  Outpatient Primary MD for the patient is [name], MD  LOS - 2   Chief Complaint  Patient presents with  . Altered Mental Status        Subjective:    Heather Frederick today has no fevers, no emesis, more sleepy, less agitated after IV morphine and lorazepam  Assessment  & Plan :    Principal Problem:   Pancreatic carcinoma metastatic to liver [facility]) Active Problems:   Altered mental status   Type 2 diabetes mellitus with complication (HCC)   Pancreatic cancer [facility])  Brief Summary:- 62 y.o. female with medical history significant of metastatic pancreatic cancer to the liver with morbid obesity/OSA who was under hospice care at home  Admitted on 05/12/17 with increased pain,  delirium and agitation. Plan is  to move patient to [facility] place when a bed is available.    Plan:- 1)Metastatic Pancreatic Ca-recommended bedside, more sleepy , occasional outburst of agitation and restlessness , will increase IV morphine drip to 6 mg/h , c/n  lorazepam IV as needed, additional IV morphine sulfate bolus as needed breakthrough pain.  Awaiting transfer to [facility] place hospice home  2)Hypercalcemia- as per Dr Learta Codding this may be contributing to her mental status changes, corrected calcium is 13.7, IVF at 150 ml/hr, Zometa 4 mg iv x 1 as per Dr Benay Spice  Code Status : DNR   Disposition Plan  : [facility] hospice home  Consults  :  Hospice   Lab Results  Component Value Date   PLT 175 05/09/2017    Inpatient Medications  Scheduled Meds: . feeding supplement (ENSURE ENLIVE)  237 mL Oral BID BM  . scopolamine  1 patch Transdermal Once   Continuous Infusions: . sodium chloride 10 mL/hr at 05/13/17 0948  . morphine 6 mg/hr (05/14/17 1149)  .  zoledronic acid (ZOMETA) IV     PRN Meds:.acetaminophen, LORazepam, morphine   Anti-infectives (From admission, onward)   None        Objective:   Vitals:   05/12/17 2200 05/13/17 0017 05/13/17 0523 05/14/17 0500  BP: 114/63 (!) 142/54 123/75 112/89  Pulse: 96 (!) 120 (!) 144 (!) 135  Resp: 20 20 20 20   Temp:  98.8 F (37.1 C) 98.2 F (36.8 C) 98.3 F (36.8 C)  TempSrc:  Axillary Oral Axillary  SpO2: 97% 94% 95% 94%  Weight:  109.8 kg (242 lb)    Height:  5\' 3"  (1.6 m)      Wt Readings from Last 3 Encounters:  05/13/17 109.8 kg (242 lb)  05/07/17 110.7 kg (244 lb)  05/04/17 110.9 kg (244 lb 9.6 oz)     Intake/Output Summary (Last 24 hours) at 05/14/2017 1706 Last data filed at 05/14/2017 0515 Gross per 24 hour  Intake -  Output 600 ml  Net -600 ml     Physical Exam  Gen:-Sleepy  HEENT:- [place].AT, No sclera icterus Neck-Supple Neck,No JVD,.  Lungs-  CTAB  CV- S1, S2 normal Abd-  +ve B.Sounds, Abd Soft, No tenderness,    Extremity/Skin:- No  edema,  Psych-sleepy, occasional restless episodes   Data Review:   Micro Results No results found for this or any previous visit (from the past 240 hour(s)).  Radiology Reports Dg Chest 2 View  Result Date: 04/24/2017 CLINICAL DATA:  Shortness of breath and abdominal pain today. Chills. EXAM: CHEST  2 VIEW COMPARISON:  04/20/2017 FINDINGS: Power port type central venous catheter with tip in the mid SVC region. No pneumothorax. Shallow inspiration with elevation of the hemidiaphragms. Cardiac enlargement with mild vascular congestion. Diffuse interstitial pattern to the lungs is increasing since previous study suggesting increasing edema. Linear atelectasis in the lung bases. No pneumothorax. Mediastinal contours appear intact. IMPRESSION: Shallow inspiration with elevation of the hemidiaphragms and atelectasis in the lung bases. Cardiac enlargement with pulmonary vascular congestion and increasing interstitial edema since  previous study. Electronically Signed   By: Lucienne Capers M.D.   On: 04/24/2017 23:50   Dg Chest 2 View  Result Date: 04/20/2017 CLINICAL DATA:  History of metastatic pancreatic cancer. Low blood pressure. EXAM: CHEST  2 VIEW COMPARISON:  04/19/2017 FINDINGS: Stable right-sided injectable port. Cardiomediastinal silhouette is normal. Mediastinal contours appear intact. There is no evidence of focal airspace consolidation, pleural effusion or pneumothorax. Chronic elevation of the hemidiaphragms with atelectatic changes at the lung bases. Osseous structures are without acute abnormality. Soft tissues are grossly normal. IMPRESSION: Low lung volume with chronic elevation of the hemidiaphragms and atelectatic changes at the lung bases. Electronically Signed   By: Fidela Salisbury M.D.   On: 04/20/2017 12:17   Dg Chest 2 View  Result Date: 04/19/2017 CLINICAL DATA:  Hypotension and history of pancreatic carcinoma EXAM: CHEST  2 VIEW COMPARISON:  04/13/2017 FINDINGS: Right-sided chest wall port is again seen. Elevation the right hemidiaphragm is again noted. The lungs are well aerated bilaterally. Minimal right basilar atelectasis is again seen. No new focal abnormality is noted. IMPRESSION: Stable right basilar atelectasis. Electronically Signed   By: Inez Catalina M.D.   On: 04/19/2017 20:49   Dg Abd 1 View  Result Date: 05/01/2017 CLINICAL DATA:  Nausea. History of metastatic pancreatic malignancy. EXAM: ABDOMEN - 1 VIEW COMPARISON:  Abdominal radiographs of May 27, 2017 FINDINGS: The colonic stool and gas pattern is similar to that seen yesterday. There is no evidence of obstruction or significant ileus. There is no significant rectal gas. IMPRESSION: No evidence of bowel obstruction.  Moderate colonic stool burden. Electronically Signed   By: David  Martinique M.D.   On: 05/01/2017 07:15   Dg Abd 1 View  Result Date: 04/29/2017 CLINICAL DATA:  Constipation for the past 2-3 days. History of pancreatic  cancer. EXAM: ABDOMEN - 1 VIEW COMPARISON:  04/28/2017; 04/25/2017; CT abdomen and pelvis - 04/28/2017 FINDINGS: Moderate colonic stool burden without evidence of enteric obstruction. Nondiagnostic evaluation for pneumoperitoneum secondary to supine patient positioning and exclusion of the lower thorax. Surgical mesh overlies the right lower abdomen. Post cholecystectomy. No acute osseus abnormalities. Stigmata of DISH within the lower thoracic spine. IMPRESSION: Moderate colonic stool burden without evidence of enteric obstruction. Electronically Signed   By: Sandi Mariscal M.D.   On: 04/29/2017 10:58   Ct Angio Chest Pe W And/or Wo Contrast  Result Date: 04/28/2017 CLINICAL DATA:  Weakness and shortness of breath. History of pancreatic cancer with metastatic disease. EXAM: CT ANGIOGRAPHY CHEST WITH CONTRAST TECHNIQUE: Multidetector CT imaging of the chest was performed using the standard protocol during bolus administration of intravenous contrast. Multiplanar CT image reconstructions and MIPs were obtained to evaluate the  vascular anatomy. CONTRAST:  100 mL ISOVUE-370 IOPAMIDOL (ISOVUE-370) INJECTION 76% COMPARISON:  04/13/2017 CT chest. FINDINGS: Cardiovascular: Satisfactory opacification of the pulmonary arteries to the segmental level. No evidence of pulmonary embolism. Normal heart size. No pericardial effusion. Mediastinum/Nodes: No enlarged mediastinal, hilar, or axillary lymph nodes. Thyroid gland, trachea, and esophagus demonstrate no significant findings. Port-A-Cath good position. Lungs/Pleura: Unchanged 6 mm RIGHT upper lobe nodule. Upper Abdomen: Hepatic metastatic disease appears similar to priors. Pancreatic head and body mass redemonstrated. Musculoskeletal: No chest wall abnormality. No acute or significant osseous findings. Review of the MIP images confirms the above findings. IMPRESSION: No evidence for pulmonary emboli. No acute cardiopulmonary findings. Hepatic metastatic disease from  pancreatic cancer. Study is insufficient characterize for improvement or progression. Electronically Signed   By: Staci Righter M.D.   On: 04/28/2017 15:51   Mr Jeri Cos MV Contrast  Result Date: 04/30/2017 CLINICAL DATA:  62 y/o F; metastatic pancreatic cancer presenting with nausea, lightheadedness, fatigue, and shortness of breath. EXAM: MRI HEAD WITHOUT AND WITH CONTRAST TECHNIQUE: Multiplanar, multiecho pulse sequences of the brain and surrounding structures were obtained without and with intravenous contrast. CONTRAST:  58mL MULTIHANCE GADOBENATE DIMEGLUMINE 529 MG/ML IV SOLN COMPARISON:  02/05/2017 MRI of the head. FINDINGS: Brain: Moderate motion artifact degrades multiple sequences. No acute infarction, hemorrhage, hydrocephalus, extra-axial collection or mass lesion. Cavum septum pellucidum. After administration of intravenous contrast there is NO abnormal enhancement of the brain Vascular: Normal flow voids. Skull and upper cervical spine: Normal marrow signal. Sinuses/Orbits: Negative. Other: None. IMPRESSION: Stable normal for age MRI of the brain.  Moderate motion artifact. Electronically Signed   By: Kristine Garbe M.D.   On: 04/30/2017 19:22   Ct Abdomen Pelvis W Contrast  Result Date: 04/28/2017 CLINICAL DATA:  Progressive abdominal pain with history of pancreatic cancer. EXAM: CT ABDOMEN AND PELVIS WITH CONTRAST TECHNIQUE: Multidetector CT imaging of the abdomen and pelvis was performed using the standard protocol following bolus administration of intravenous contrast. CONTRAST:  80 cc Isovue 300 intravenously. COMPARISON:  Abdominal CT 04/13/2017 FINDINGS: Lower chest: Atelectatic changes in the right lung base. Partially visualized 6 mm right upper lobe pulmonary nodule. Hepatobiliary: No significant change in numerous bilateral hepatic metastatic lesions. Pancreas: Decreased in size hypoattenuated body of the pancreas malignancy, now measuring approximately 4.0 by 2.5 cm.  Persistent dilation of the downstream main pancreatic duct and atrophy of the tail of the pancreas. Persistent peripancreatic fat stranding. Small peripancreatic lymph nodes appear stable. Spleen: Normal in size without focal abnormality. Adrenals/Urinary Tract: Adrenal glands are unremarkable. Kidneys are normal, without renal calculi, focal lesion, or hydronephrosis. Bladder is unremarkable. Stomach/Bowel: Stomach is within normal limits. Appendix appears normal. No evidence of bowel wall thickening, distention, or inflammatory changes. Vascular/Lymphatic: Mild upper abdominal retroperitoneal lymphadenopathy. No significant vascular findings. Reproductive: Status post hysterectomy. No adnexal masses. Other: No abdominal wall hernia or abnormality. No abdominopelvic ascites. Prior ventral hernia repair. Musculoskeletal: No acute or significant osseous findings. IMPRESSION: Suboptimal timing of contrast administration. Interval mild decrease in size of pancreatic body malignancy. Mild upper abdominal retroperitoneal lymphadenopathy. No significant change in widespread hepatic metastatic disease. Electronically Signed   By: Fidela Salisbury M.D.   On: 04/28/2017 17:39   Ir Removal [employer] Access W/ [place] W/o Fl Mod Sed  Result Date: 05/08/2017 CLINICAL DATA:  End-stage metastatic pancreatic carcinoma and unexplained fever with possibility that indwelling port is a source bacteremia. Request has been made to remove the port. EXAM: REMOVAL OF IMPLANTED TUNNELED PORT-A-CATH MEDICATIONS:  Moderate (conscious) sedation was employed during this procedure. A total of Versed 0.5 mg and Fentanyl 25 mcg was administered intravenously. Moderate Sedation Time: 20 minutes. The patient's level of consciousness and vital signs were monitored continuously by radiology nursing throughout the procedure under my direct supervision. PROCEDURE: The right chest Port-A-Cath site was prepped with chlorhexidine. A sterile gown and gloves  were worn during the procedure. Local anesthesia was provided with 1% lidocaine. An incision was made overlying the Port-A-Cath with a #15 scalpel. Utilizing sharp and blunt dissection, the Port-A-Cath was removed. Portable cautery was utilized. Retention sutures were removed. The pocked was irrigated with sterile saline. Wound closure was performed with subcutaneous 3-0 Monocryl, subcuticular 4-0 Vicryl and Dermabond. The entire Port-A-Cath was removed successfully. IMPRESSION: Removal of implanted Port-A-Cath utilizing sharp and blunt dissection. The procedure was uncomplicated. Electronically Signed   By: Aletta Edouard M.D.   On: 05/08/2017 16:03   Dg Chest Port 1v Same Day  Result Date: 05/06/2017 CLINICAL DATA:  Weakness and shortness of breath. EXAM: PORTABLE CHEST 1 VIEW COMPARISON:  04/28/2017 CT, 04/24/2017 radiographs and prior studies. FINDINGS: Cardiomegaly and right Port-A-Cath with tip overlying the mid SVC again noted. Right hemithorax volume loss and mild right basilar atelectasis/scarring again noted. There is no evidence of airspace disease, pleural effusion or pneumothorax. No acute bony abnormalities are present. IMPRESSION: Unchanged appearance of the chest with mild right basilar atelectasis/scarring. Electronically Signed   By: Margarette Canada M.D.   On: 05/06/2017 07:54   Dg Abd Portable 1v  Result Date: 04/25/2017 CLINICAL DATA:  Abdominal pain EXAM: PORTABLE ABDOMEN - 1 VIEW COMPARISON:  CT abdomen and pelvis 04/13/2017 FINDINGS: Prior ventral hernia repair in pelvis. Surgical clips RIGHT upper quadrant related to prior cholecystectomy. Nonobstructive bowel gas pattern. No bowel dilatation or bowel wall thickening. Bones demineralized. Small pelvic phleboliths without definite urinary tract calcification. IMPRESSION: Normal bowel gas pattern. Electronically Signed   By: Lavonia Dana M.D.   On: 04/25/2017 15:37   Dg Abd Portable 2 Views  Result Date: 04/28/2017 CLINICAL DATA:   Vomiting EXAM: PORTABLE ABDOMEN - 2 VIEW COMPARISON:  04/25/2017 FINDINGS: The bowel gas pattern is normal. There is no evidence of free air. No radio-opaque calculi or other significant radiographic abnormality is seen. IMPRESSION: Negative. Electronically Signed   By: Kathreen Devoid   On: 04/28/2017 14:17     CBC Recent Labs  Lab 05/08/17 0609 05/09/17 0552  WBC 13.1* 11.9*  HGB 8.4* 8.5*  HCT 26.5* 27.2*  PLT 167 175  MCV 89.5 90.1  MCH 28.4 28.1  MCHC 31.7 31.3  RDW 18.8* 18.8*  LYMPHSABS 1.4 1.1  MONOABS 1.9* 1.3*  EOSABS 0.4 0.1  BASOSABS 0.0 0.0    Chemistries  Recent Labs  Lab 05/08/17 0609 05/09/17 0552 05/14/17 1513  NA 134* 137 144  K 4.1 4.1 4.3  CL 97* 99* 103  CO2 26 26 25   GLUCOSE 101* 128* 133*  BUN 7 8 23*  CREATININE 0.54  0.56 0.58 0.92  CALCIUM 9.7 11.0* 12.5*  MG 2.0  --   --   AST  --   --  76*  ALT  --   --  30  ALKPHOS  --   --  441*  BILITOT  --   --  3.1*   ------------------------------------------------------------------------------------------------------------------ No results for input(s): CHOL, HDL, LDLCALC, TRIG, CHOLHDL, LDLDIRECT in the last 72 hours.  Lab Results  Component Value Date   HGBA1C 7.2 (H) 09/29/2016   ------------------------------------------------------------------------------------------------------------------  No results for input(s): TSH, T4TOTAL, T3FREE, THYROIDAB in the last 72 hours.  Invalid input(s): FREET3 ------------------------------------------------------------------------------------------------------------------ No results for input(s): VITAMINB12, FOLATE, FERRITIN, TIBC, IRON, RETICCTPCT in the last 72 hours.  Coagulation profile Recent Labs  Lab 05/08/17 0609  INR 1.85    No results for input(s): DDIMER in the last 72 hours.  Cardiac Enzymes No results for input(s): CKMB, TROPONINI, MYOGLOBIN in the last 168 hours.  Invalid input(s):  CK ------------------------------------------------------------------------------------------------------------------ No results found for: BNP   Roxan Hockey M.D on 05/14/2017 at 5:06 PM  Between 7am to 7pm - Pager - [phone]  After 7pm go to [email or web address] - password [email or web address]  [facility] -  Office  [phone]   Voice Recognition Viviann Spare dictation system was used to create this note, attempts have been made to correct errors. Please contact the author with questions and/or clarifications.

## 2017-05-14 NOTE — Progress Notes (Signed)
Pt was found asleep with home cpap unit at bedside. This RT offered to assist pt with cpap, but family declined stating she has not been wearing it.  Patient's family was advised that RT is available all night should they change their mind.

## 2017-05-14 NOTE — Telephone Encounter (Signed)
Please notify Dr Benay Spice that pt admitted. [place]

## 2017-05-14 NOTE — Progress Notes (Signed)
[facility] Work note Patient was resting in bed, awaking briefly to open eyes and move about, yet settle back down moments later. Her husband was at the bedside offering physical presence and support for when patient wakes. He shared coping with changes and concerns. He is waiting for bed at [facility] and we discussed levels of care, costs. Support offered as husband shared feelings, psychosocial history. There are no beds available at [facility] at the time of this writing.  Heather Frederick, [place]

## 2017-05-14 NOTE — Progress Notes (Signed)
Family at bedside most of the day. Pt turned and repositioned as family would let us. Pt was bathed and sheets changed. Mouth care given q 3 hr and gel applied to lips. Foley draining amber urine

## 2017-05-14 NOTE — Progress Notes (Signed)
CSW following to assist with disposition. Pt admitted from home- had hospice services at home through [facility]- reports this is GIP admission ([facility] attempting to notify admitting). Pt/family has opted to pursue residential hospice at [facility] and has been approved for [facility] pending bed availability. Will follow to assist as needed.  Sharren Bridge, MSW, LCSW Clinical Social Work 05/14/2017 [phone]

## 2017-05-14 NOTE — Progress Notes (Addendum)
0930 [facility] GIP RN Visit - [facility] [phone]  Visited with patient and husband at bedside. Patient was sleeping and did not appear to be in distress, respirations regular and unlabored. Husband reports that patient was restless and agitated during the night but this has improved with PRN medications given.  Husband inquired about BP availability today. I advised Mr. Farish we would let him know if room becomes available.   Code Status DNR  PPS 30%  - What led to hospitalization Patient family decided on ED evaluation due to uncontrolled pain, increased delirium, and increased agitation at home although with hospice services and attempt for management at home.  ED evaluation on 05/12/17 with subsequent admission. This is a related and covered admission per Dr. Tomasa Hosteller.   - What was tried in previous setting that was ineffective Pain management medications as well as antianxiety medications and medications for agitation management  - Care needs caregiver cannot manage at home Patients spouse reports to me on today that he was not able to handle her increased agitation, yelling, and she got to the point where he could not administer medications/  He notes this was the most pivotal for him.  - Admission diagnosis Severe pain and AMS  - Day of hospital GIP LOS Today is day #2  - Specific symptoms being actively managed Pain and agitation  - Services being provided and response to treatment RN and MD - Ativan 2mg  at 0506, Morphine 5mg /hr IVF continuous, Morphine Bolus 4mg  PRN given at 0022 and 0507.   - Goals of Care Comfort and per plan of care; awaiting [facility] placement at this time  - Discharge planning Time/date not yet determined; goal is for [facility] placement  - Communication to [place] support and answered any questions on today as he was present along with patients daughter  - Communication to [place] is to discharge to  [facility] and not home with family.  Continued evaluation and assessment by hospice staff to continue  Please call [facility] with any questions.    Please use [facility] for transport.  Thank you,  Farrel Gordon, RN, [facility] Liaison [phone]  [facility] Liaisons are on [facility]

## 2017-05-15 MED ORDER — MORPHINE SULFATE 20 MG/5ML PO SOLN: 10.0000 mg | ORAL | 0 refills | Status: AC | PRN

## 2017-05-15 MED ORDER — LORAZEPAM 1 MG PO TABS: 1.0000 mg | ORAL_TABLET | Freq: Every day | ORAL | 0 refills | Status: AC

## 2017-05-15 MED ORDER — SCOPOLAMINE 1 MG/3DAYS TD PT72: 1.0000 | MEDICATED_PATCH | Freq: Once | TRANSDERMAL | 12 refills | Status: AC

## 2017-05-15 MED ORDER — LORAZEPAM 1 MG PO TABS: 1.0000 mg | ORAL_TABLET | ORAL | 0 refills | Status: AC | PRN

## 2017-05-15 MED ORDER — ACETAMINOPHEN 650 MG RE SUPP: 650.0000 mg | RECTAL | 0 refills | Status: AC | PRN

## 2017-05-15 MED ORDER — LORAZEPAM 1 MG PO TABS: 1.0000 mg | ORAL_TABLET | Freq: Three times a day (TID) | ORAL | 0 refills | Status: AC | PRN

## 2017-05-15 MED ORDER — SODIUM CHLORIDE 0.9 % IV BOLUS (SEPSIS)
500.0000 mL | Freq: Once | INTRAVENOUS | Status: AC
  Administered 2017-05-15: 500 mL via INTRAVENOUS

## 2017-05-15 NOTE — Progress Notes (Signed)
Pt discharged with plan to admit to [facility] for residential hospice care. Family at bedside, aware and agreeable to plan. Pt already followed by [facility] pta. Report # [id number]. Arranged EMS transport.  Sharren Bridge, MSW, LCSW Clinical Social Work 05/15/2017 [phone]

## 2017-05-15 NOTE — Progress Notes (Signed)
[facility] Liaison: RN visit  Patient has been accepted for transfer to [facility] today.  I spoke with patient's husband to make him aware.  Sharren Bridge, CSW notified.   Please fax discharge summary to [phone]. RN call report to [phone]. Please arrange transport with [facility] for patient to arrive as soon as possible.  Please call with any hospice related questions.  Thank you,  Farrel Gordon, RN, [facility] Liaison [phone]  [facility] hospital liaisons are on [place].

## 2017-05-15 NOTE — Progress Notes (Signed)
Report called to Derrel Nip Rn at [facility]. Patient transported via [place] to [facility]. Husband at bedside to gather and transport belongings

## 2017-05-15 NOTE — Discharge Summary (Signed)
Heather Frederick, is a 62 y.o. female  DOB 11-10-1955  MRN [id number].  Admission date:  05/12/2017  Admitting Physician  Elwyn Reach, MD  Discharge Date:  05/15/2017   Primary MD  [name], MD  Recommendations for primary care physician for things to follow:   Transferred to [facility]   Admission Diagnosis  cancer pt   Discharge Diagnosis  cancer pt    Principal Problem:   Pancreatic carcinoma metastatic to liver [facility]) Active Problems:   Altered mental status   Type 2 diabetes mellitus with complication ([place])   Pancreatic cancer [facility])      Past Medical History:  Diagnosis Date  . Allergy   . Anxiety   . Arthritis   . Benign essential HTN 09/23/2014  . Cancer ([place])   . Chronic kidney disease    uti  . Depression   . Diabetes mellitus without complication ([place])   . Dyslipidemia   . Elevated liver enzymes   . Family history of anesthesia complication    father has a severe hard time waking up  . Gallstones    a. Seen on CT 01/2014.  Marland Kitchen GERD (gastroesophageal reflux disease)   . Hard of hearing   . Hepatic steatosis   . History of frequent urinary tract infections   . Hyperlipidemia   . Hypertension   . Lateral epicondylitis of right elbow   . Mental disorder   . Meralgia paresthetica of right side 10/02/2012   slight at 05/2014  . Migraine headache   . Obesity   . OSA (obstructive sleep apnea)    severe with AHI 37/hr now on CPAP at 18cm H2O  . Osteoarthritis   . Pancreatic cancer metastasized to liver ([place])   . Pneumonia    Feb 2018  . Raynaud disease    in feet per patient   . Sepsis ([place]) 02/23/2017  . Sleep apnea    wears c-pap  . Urinary tract infection     Past Surgical History:  Procedure Laterality Date  . ABDOMINAL HYSTERECTOMY  1/04   partial  . BLADDER SUSPENSION  6/10  . CARDIAC CATHETERIZATION    . carpel tunnel left/right  7/08, 8/08  Bilateral 8/08 and 7/08  . carpel tunnel rel Right 4/12  . CHOLECYSTECTOMY N/A 06/11/2014   Procedure: LAPAROSCOPIC CHOLECYSTECTOMY WITH ATTEMPTED INTRAOPERATIVE CHOLANGIOGRAM;  Surgeon: Jackolyn Confer, MD;  Location: [facility];  Service: General;  Laterality: N/A;  . COLONOSCOPY    . ERCP N/A 10/19/2014   Procedure: ENDOSCOPIC RETROGRADE CHOLANGIOPANCREATOGRAPHY (ERCP);  Surgeon: Ladene Artist, MD;  Location: [facility];  Service: Endoscopy;  Laterality: N/A;  . INCISIONAL HERNIA REPAIR N/A 06/30/2016   Procedure: LAPAROSCOPIC REPAIR OF INCISIONAL HERNIA WITH MESH;  Surgeon: Jackolyn Confer, MD;  Location: [facility];  Service: General;  Laterality: N/A;  . INSERTION OF MESH N/A 06/30/2016   Procedure: INSERTION OF MESH;  Surgeon: Jackolyn Confer, MD;  Location: [facility];  Service: General;  Laterality: N/A;  . IR  CV LINE INJECTION  11/24/2016  . IR FLUORO GUIDE PORT INSERTION RIGHT  08/28/2016  . IR REMOVAL TUN ACCESS W/ PORT W/O FL MOD SED  05/08/2017  . IR US GUIDE VASC ACCESS RIGHT  08/28/2016  . JOINT REPLACEMENT    . KNEE ARTHROSCOPY Left 12/12  . KNEE ARTHROSCOPY Right 12/06  . LEFT HEART CATHETERIZATION WITH CORONARY ANGIOGRAM N/A 03/10/2014   Procedure: LEFT HEART CATHETERIZATION WITH CORONARY ANGIOGRAM;  Surgeon: Sinclair Grooms, MD;  Location: [facility] CATH LAB;  Service: Cardiovascular;  Laterality: N/A;  . PLANTAR FASCIA RELEASE Right 12/10  . radial tunnel release     right arm   . ROTATOR CUFF REPAIR Left 6/11  . tennis elbow release Right 7/04  . TOTAL KNEE ARTHROPLASTY Left 09/10/2012   Procedure: TOTAL KNEE ARTHROPLASTY- left;  Surgeon: Garald Balding, MD;  Location: [place];  Service: Orthopedics;  Laterality: Left;  Left total knee arthroplasty       HPI  from the history and physical done on the day of admission:    Patient coming from: Home  Chief Complaint: Severe pain and altered mental status  HPI: Heather Frederick is a 62 y.o. female with medical history significant of  metastatic pancreatic cancer to the liver with morbid obesity obstructive sleep apnea as well as anxiety disorder who is currently on hospice care at home with plan to move patient to [facility] place when a bed is available patient. Patient is on hospice care at home after recent hospitalization and discharge. She has continued to have uncontrolled pain at home. Hospice has been adjusting patient's medications. She has remained delirious and not improving. There was plan to move patient to [facility] today but no bed available. Family brought patient to the emergency room. Patient is being admitted for pain management optimization.  ED Course: Patient was given IV Ativan as well as pain medications. Discussion with husband and hospice care and plan to admit patient for pain management pending her transfer of to hospice.       Hospital Course:     Brief Summary:- 62 y.o.femalewith medical history significant ofmetastatic pancreatic cancer to the liver with morbid obesity/OSA who was under hospice care at home  Admitted on 05/12/17 with increased pain,  delirium and agitation. [facility] is [facility] dying    Plan:- 1)Metastatic Pancreatic Ca-husband at bedside, more sleepy ,  currently on IV morphine drip to 6 mg/h , given  lorazepam IV as needed, additional IV morphine sulfate bolus as needed breakthrough pain.   transfer to [facility] place hospice home, patient is actively dying  2)Hypercalcemia- as per Dr Learta Codding this may be contributing to her mental status changes, corrected calcium is 13.7, treated with IV fluids, Zometa 4 mg iv x 1 as per Dr Benay Spice.  Per husband no need for further blood work at this time.  Dr. Learta Codding from oncology notified of family desire to avoid further poking/blood work  Code Status : DNR   Disposition Plan  : [facility]  Consults  :  Hospice/Oncology  Discharge Condition: Poor --- patient is actively  dying  Discharge Instructions     Discharge Instructions    Bed rest   Complete by:  As directed    Call MD for:  persistant nausea and vomiting   Complete by:  As directed    Call MD for:  severe uncontrolled pain   Complete by:  As directed    Diet  general   Complete by:  As directed    Discharge instructions   Complete by:  As directed    DisCharge to hospice house/[facility]--- with comfort measures only        Discharge Medications     Allergies as of 05/15/2017      Reactions   Topamax [topiramate] Other (See Comments)   Stroke like symptoms   Aleve [naproxen Sodium] Hives   Has tolerated Voltaren topical as well as aspirin.   Bee Venom Swelling   Echinacea Hives   Other Other (See Comments)   Feathers cause sinus congestion   Sulfa Antibiotics Hives   Advil [ibuprofen] Hives   Has tolerated Voltaren topical as well as aspirin.      Medication List    STOP taking these medications   citalopram 20 MG tablet Commonly known as:  CELEXA   cyclobenzaprine 5 MG tablet Commonly known as:  FLEXERIL   eletriptan 40 MG tablet Commonly known as:  RELPAX   insulin detemir 100 unit/ml Soln Commonly known as:  LEVEMIR   morphine 30 MG 12 hr tablet Commonly known as:  MS CONTIN Replaced by:  morphine 20 MG/5ML solution   nitroGLYCERIN 0.4 MG SL tablet Commonly known as:  NITROSTAT   oxybutynin 10 MG 24 hr tablet Commonly known as:  DITROPAN-XL   Oxycodone HCl 10 MG Tabs   pantoprazole 40 MG tablet Commonly known as:  PROTONIX   polyethylene glycol packet Commonly known as:  MIRALAX / GLYCOLAX   predniSONE 5 MG tablet Commonly known as:  DELTASONE   prochlorperazine 10 MG tablet Commonly known as:  COMPAZINE   senna-docusate 8.6-50 MG tablet Commonly known as:  Senokot-S   simethicone 125 MG chewable tablet Commonly known as:  MYLICON     TAKE these medications   acetaminophen 325 MG tablet Commonly known as:  TYLENOL Take 2 tablets (650  mg total) by mouth 2 (two) times daily at 10 AM and 5 PM. What changed:  Another medication with the same name was added. Make sure you understand how and when to take each.   acetaminophen 650 MG suppository Commonly known as:  TYLENOL Place 1 suppository (650 mg total) rectally every 4 (four) hours as needed for fever or mild pain. What changed:  You were already taking a medication with the same name, and this prescription was added. Make sure you understand how and when to take each.   bisacodyl 5 MG EC tablet Commonly known as:  DULCOLAX Take 2 tablets (10 mg total) by mouth daily as needed for moderate constipation.   feeding supplement (ENSURE ENLIVE) Liqd Take 237 mLs by mouth 2 (two) times daily between meals.   haloperidol 5 MG tablet Commonly known as:  HALDOL Take 5 mg by mouth every 4 (four) hours. Scheduled every 4 hours   insulin lispro 100 UNIT/ML injection Commonly known as:  HUMALOG Inject 2-10 Units into the skin. Sliding Scale   lactulose 10 GM/15ML solution Commonly known as:  CHRONULAC Take 30 mLs (20 g total) by mouth daily as needed for mild constipation.   LORazepam 1 MG tablet Commonly known as:  ATIVAN Take 1 tablet (1 mg total) by mouth at bedtime. What changed:  You were already taking a medication with the same name, and this prescription was added. Make sure you understand how and when to take each.   LORazepam 1 MG tablet Commonly known as:  ATIVAN Take 1 tablet (1 mg total) by mouth  every 8 (eight) hours as needed for anxiety. What changed:  You were already taking a medication with the same name, and this prescription was added. Make sure you understand how and when to take each.   LORazepam 1 MG tablet Commonly known as:  ATIVAN Take 1 tablet (1 mg total) by mouth every 2 (two) hours as needed for anxiety or sedation (or nausea). What changed:  when to take this   morphine 20 MG/5ML solution Take 2.5 mLs (10 mg total) by mouth every hour  as needed for pain. Replaces:  morphine 30 MG 12 hr tablet   ondansetron 4 MG tablet Commonly known as:  ZOFRAN Take 1 tablet (4 mg total) by mouth every 6 (six) hours as needed for nausea.   scopolamine 1 MG/3DAYS Commonly known as:  TRANSDERM-SCOP Place 1 patch (1.5 mg total) onto the skin once for 1 dose.       Major procedures and Radiology Reports - PLEASE review detailed and final reports for all details, in brief -   Dg Chest 2 View  Result Date: 04/24/2017 CLINICAL DATA:  Shortness of breath and abdominal pain today. Chills. EXAM: CHEST  2 VIEW COMPARISON:  04/20/2017 FINDINGS: Power port type central venous catheter with tip in the mid SVC region. No pneumothorax. Shallow inspiration with elevation of the hemidiaphragms. Cardiac enlargement with mild vascular congestion. Diffuse interstitial pattern to the lungs is increasing since previous study suggesting increasing edema. Linear atelectasis in the lung bases. No pneumothorax. Mediastinal contours appear intact. IMPRESSION: Shallow inspiration with elevation of the hemidiaphragms and atelectasis in the lung bases. Cardiac enlargement with pulmonary vascular congestion and increasing interstitial edema since previous study. Electronically Signed   By: Lucienne Capers M.D.   On: 04/24/2017 23:50   Dg Chest 2 View  Result Date: 04/20/2017 CLINICAL DATA:  History of metastatic pancreatic cancer. Low blood pressure. EXAM: CHEST  2 VIEW COMPARISON:  04/19/2017 FINDINGS: Stable right-sided injectable port. Cardiomediastinal silhouette is normal. Mediastinal contours appear intact. There is no evidence of focal airspace consolidation, pleural effusion or pneumothorax. Chronic elevation of the hemidiaphragms with atelectatic changes at the lung bases. Osseous structures are without acute abnormality. Soft tissues are grossly normal. IMPRESSION: Low lung volume with chronic elevation of the hemidiaphragms and atelectatic changes at the lung  bases. Electronically Signed   By: Fidela Salisbury M.D.   On: 04/20/2017 12:17   Dg Chest 2 View  Result Date: 04/19/2017 CLINICAL DATA:  Hypotension and history of pancreatic carcinoma EXAM: CHEST  2 VIEW COMPARISON:  04/13/2017 FINDINGS: Right-sided chest wall port is again seen. Elevation the right hemidiaphragm is again noted. The lungs are well aerated bilaterally. Minimal right basilar atelectasis is again seen. No new focal abnormality is noted. IMPRESSION: Stable right basilar atelectasis. Electronically Signed   By: Inez Catalina M.D.   On: 04/19/2017 20:49   Dg Abd 1 View  Result Date: 05/01/2017 CLINICAL DATA:  Nausea. History of metastatic pancreatic malignancy. EXAM: ABDOMEN - 1 VIEW COMPARISON:  Abdominal radiographs of May 27, 2017 FINDINGS: The colonic stool and gas pattern is similar to that seen yesterday. There is no evidence of obstruction or significant ileus. There is no significant rectal gas. IMPRESSION: No evidence of bowel obstruction.  Moderate colonic stool burden. Electronically Signed   By: David  Martinique M.D.   On: 05/01/2017 07:15   Dg Abd 1 View  Result Date: 04/29/2017 CLINICAL DATA:  Constipation for the past 2-3 days. History of pancreatic cancer. EXAM:  ABDOMEN - 1 VIEW COMPARISON:  04/28/2017; 04/25/2017; CT abdomen and pelvis - 04/28/2017 FINDINGS: Moderate colonic stool burden without evidence of enteric obstruction. Nondiagnostic evaluation for pneumoperitoneum secondary to supine patient positioning and exclusion of the lower thorax. Surgical mesh overlies the right lower abdomen. Post cholecystectomy. No acute osseus abnormalities. Stigmata of DISH within the lower thoracic spine. IMPRESSION: Moderate colonic stool burden without evidence of enteric obstruction. Electronically Signed   By: Sandi Mariscal M.D.   On: 04/29/2017 10:58   Ct Angio Chest Pe W And/or Wo Contrast  Result Date: 04/28/2017 CLINICAL DATA:  Weakness and shortness of breath. History of  pancreatic cancer with metastatic disease. EXAM: CT ANGIOGRAPHY CHEST WITH CONTRAST TECHNIQUE: Multidetector CT imaging of the chest was performed using the standard protocol during bolus administration of intravenous contrast. Multiplanar CT image reconstructions and MIPs were obtained to evaluate the vascular anatomy. CONTRAST:  100 mL ISOVUE-370 IOPAMIDOL (ISOVUE-370) INJECTION 76% COMPARISON:  04/13/2017 CT chest. FINDINGS: Cardiovascular: Satisfactory opacification of the pulmonary arteries to the segmental level. No evidence of pulmonary embolism. Normal heart size. No pericardial effusion. Mediastinum/Nodes: No enlarged mediastinal, hilar, or axillary lymph nodes. Thyroid gland, trachea, and esophagus demonstrate no significant findings. Port-A-Cath good position. Lungs/Pleura: Unchanged 6 mm RIGHT upper lobe nodule. Upper Abdomen: Hepatic metastatic disease appears similar to priors. Pancreatic head and body mass redemonstrated. Musculoskeletal: No chest wall abnormality. No acute or significant osseous findings. Review of the MIP images confirms the above findings. IMPRESSION: No evidence for pulmonary emboli. No acute cardiopulmonary findings. Hepatic metastatic disease from pancreatic cancer. Study is insufficient characterize for improvement or progression. Electronically Signed   By: Staci Righter M.D.   On: 04/28/2017 15:51   Mr Jeri Cos PX Contrast  Result Date: 04/30/2017 CLINICAL DATA:  62 y/o F; metastatic pancreatic cancer presenting with nausea, lightheadedness, fatigue, and shortness of breath. EXAM: MRI HEAD WITHOUT AND WITH CONTRAST TECHNIQUE: Multiplanar, multiecho pulse sequences of the brain and surrounding structures were obtained without and with intravenous contrast. CONTRAST:  76mL MULTIHANCE GADOBENATE DIMEGLUMINE 529 MG/ML IV SOLN COMPARISON:  02/05/2017 MRI of the head. FINDINGS: Brain: Moderate motion artifact degrades multiple sequences. No acute infarction, hemorrhage,  hydrocephalus, extra-axial collection or mass lesion. Cavum septum pellucidum. After administration of intravenous contrast there is NO abnormal enhancement of the brain Vascular: Normal flow voids. Skull and upper cervical spine: Normal marrow signal. Sinuses/Orbits: Negative. Other: None. IMPRESSION: Stable normal for age MRI of the brain.  Moderate motion artifact. Electronically Signed   By: Kristine Garbe M.D.   On: 04/30/2017 19:22   Ct Abdomen Pelvis W Contrast  Result Date: 04/28/2017 CLINICAL DATA:  Progressive abdominal pain with history of pancreatic cancer. EXAM: CT ABDOMEN AND PELVIS WITH CONTRAST TECHNIQUE: Multidetector CT imaging of the abdomen and pelvis was performed using the standard protocol following bolus administration of intravenous contrast. CONTRAST:  80 cc Isovue 300 intravenously. COMPARISON:  Abdominal CT 04/13/2017 FINDINGS: Lower chest: Atelectatic changes in the right lung base. Partially visualized 6 mm right upper lobe pulmonary nodule. Hepatobiliary: No significant change in numerous bilateral hepatic metastatic lesions. Pancreas: Decreased in size hypoattenuated body of the pancreas malignancy, now measuring approximately 4.0 by 2.5 cm. Persistent dilation of the downstream main pancreatic duct and atrophy of the tail of the pancreas. Persistent peripancreatic fat stranding. Small peripancreatic lymph nodes appear stable. Spleen: Normal in size without focal abnormality. Adrenals/Urinary Tract: Adrenal glands are unremarkable. Kidneys are normal, without renal calculi, focal lesion, or hydronephrosis. Bladder is unremarkable.  Stomach/Bowel: Stomach is within normal limits. Appendix appears normal. No evidence of bowel wall thickening, distention, or inflammatory changes. Vascular/Lymphatic: Mild upper abdominal retroperitoneal lymphadenopathy. No significant vascular findings. Reproductive: Status post hysterectomy. No adnexal masses. Other: No abdominal wall hernia  or abnormality. No abdominopelvic ascites. Prior ventral hernia repair. Musculoskeletal: No acute or significant osseous findings. IMPRESSION: Suboptimal timing of contrast administration. Interval mild decrease in size of pancreatic body malignancy. Mild upper abdominal retroperitoneal lymphadenopathy. No significant change in widespread hepatic metastatic disease. Electronically Signed   By: Fidela Salisbury M.D.   On: 04/28/2017 17:39   Ir Removal [employer] Access W/ [place] W/o Fl Mod Sed  Result Date: 05/08/2017 CLINICAL DATA:  End-stage metastatic pancreatic carcinoma and unexplained fever with possibility that indwelling port is a source bacteremia. Request has been made to remove the port. EXAM: REMOVAL OF IMPLANTED TUNNELED PORT-A-CATH MEDICATIONS: Moderate (conscious) sedation was employed during this procedure. A total of Versed 0.5 mg and Fentanyl 25 mcg was administered intravenously. Moderate Sedation Time: 20 minutes. The patient's level of consciousness and vital signs were monitored continuously by radiology nursing throughout the procedure under my direct supervision. PROCEDURE: The right chest Port-A-Cath site was prepped with chlorhexidine. A sterile gown and gloves were worn during the procedure. Local anesthesia was provided with 1% lidocaine. An incision was made overlying the Port-A-Cath with a #15 scalpel. Utilizing sharp and blunt dissection, the Port-A-Cath was removed. Portable cautery was utilized. Retention sutures were removed. The pocked was irrigated with sterile saline. Wound closure was performed with subcutaneous 3-0 Monocryl, subcuticular 4-0 Vicryl and Dermabond. The entire Port-A-Cath was removed successfully. IMPRESSION: Removal of implanted Port-A-Cath utilizing sharp and blunt dissection. The procedure was uncomplicated. Electronically Signed   By: Aletta Edouard M.D.   On: 05/08/2017 16:03   Dg Chest Port 1v Same Day  Result Date: 05/06/2017 CLINICAL DATA:  Weakness and  shortness of breath. EXAM: PORTABLE CHEST 1 VIEW COMPARISON:  04/28/2017 CT, 04/24/2017 radiographs and prior studies. FINDINGS: Cardiomegaly and right Port-A-Cath with tip overlying the mid SVC again noted. Right hemithorax volume loss and mild right basilar atelectasis/scarring again noted. There is no evidence of airspace disease, pleural effusion or pneumothorax. No acute bony abnormalities are present. IMPRESSION: Unchanged appearance of the chest with mild right basilar atelectasis/scarring. Electronically Signed   By: Margarette Canada M.D.   On: 05/06/2017 07:54   Dg Abd Portable 1v  Result Date: 04/25/2017 CLINICAL DATA:  Abdominal pain EXAM: PORTABLE ABDOMEN - 1 VIEW COMPARISON:  CT abdomen and pelvis 04/13/2017 FINDINGS: Prior ventral hernia repair in pelvis. Surgical clips RIGHT upper quadrant related to prior cholecystectomy. Nonobstructive bowel gas pattern. No bowel dilatation or bowel wall thickening. Bones demineralized. Small pelvic phleboliths without definite urinary tract calcification. IMPRESSION: Normal bowel gas pattern. Electronically Signed   By: Lavonia Dana M.D.   On: 04/25/2017 15:37   Dg Abd Portable 2 Views  Result Date: 04/28/2017 CLINICAL DATA:  Vomiting EXAM: PORTABLE ABDOMEN - 2 VIEW COMPARISON:  04/25/2017 FINDINGS: The bowel gas pattern is normal. There is no evidence of free air. No radio-opaque calculi or other significant radiographic abnormality is seen. IMPRESSION: Negative. Electronically Signed   By: Kathreen Devoid   On: 04/28/2017 14:17    Micro Results     No results found for this or any previous visit (from the past 240 hour(s)).     Today   Subjective    Heather Frederick today has no new complaints, husband at bedside, resting comfortably,  sleepy   Patient has been seen and examined prior to discharge   Objective   Blood pressure 105/68, pulse (!) 121, temperature 98.8 F (37.1 C), temperature source Axillary, resp. rate (!) 22, height 5\' 3"  (1.6 m),  weight 109.8 kg (242 lb), SpO2 98 %.   Intake/Output Summary (Last 24 hours) at 05/15/2017 1059 Last data filed at 05/14/2017 1805 Gross per 24 hour  Intake 267.5 ml  Output 450 ml  Net -182.5 ml    Exam Gen:-Sleepy  HEENT:- [place].AT,  Neck-Supple Neck,No JVD,.  Lungs-  CTAB  CV- S1, S2 normal Abd-  +ve B.Sounds, Abd Soft, No tenderness,    Extremity/Skin:- No  edema,    Psych-sleepy,     Data Review   CBC w Diff:  Lab Results  Component Value Date   WBC 11.9 (H) 05/09/2017   HGB 8.5 (L) 05/09/2017   HGB 9.4 (L) 03/19/2017   HCT 27.2 (L) 05/09/2017   HCT 30.3 (L) 03/19/2017   PLT 175 05/09/2017   PLT 184 05/04/2017   PLT 179 03/19/2017   LYMPHOPCT 9 05/09/2017   LYMPHOPCT 31.3 03/19/2017   MONOPCT 11 05/09/2017   MONOPCT 23.9 (H) 03/19/2017   EOSPCT 1 05/09/2017   EOSPCT 7.4 (H) 03/19/2017   BASOPCT 0 05/09/2017   BASOPCT 0.3 03/19/2017    CMP:  Lab Results  Component Value Date   NA 144 05/14/2017   NA 137 03/19/2017   K 4.3 05/14/2017   K 3.4 (L) 03/19/2017   CL 103 05/14/2017   CO2 25 05/14/2017   CO2 24 03/19/2017   BUN 23 (H) 05/14/2017   BUN 6.2 (L) 03/19/2017   CREATININE 0.92 05/14/2017   CREATININE 0.61 05/04/2017   CREATININE 0.8 03/19/2017   GLU 365 (H) 09/29/2016   PROT 6.7 05/14/2017   PROT 6.6 03/19/2017   ALBUMIN 2.5 (L) 05/14/2017   ALBUMIN 2.7 (L) 03/19/2017   BILITOT 3.1 (H) 05/14/2017   BILITOT 0.9 05/04/2017   BILITOT 0.42 03/19/2017   ALKPHOS 441 (H) 05/14/2017   ALKPHOS 150 03/19/2017   AST 76 (H) 05/14/2017   AST 41 (H) 05/04/2017   AST 24 03/19/2017   ALT 30 05/14/2017   ALT 24 05/04/2017   ALT 20 03/19/2017  .   Total Discharge time is about 33 minutes  Roxan Hockey M.D on 05/15/2017 at 10:59 AM  [facility]   Office  [phone]  Voice Recognition Viviann Spare dictation system was used to create this note, attempts have been made to correct errors. Please contact the author with questions and/or  clarifications.

## 2017-05-18 ENCOUNTER — Ambulatory Visit: Payer: 59 | Admitting: Oncology

## 2017-05-25 DEATH — deceased

## 2017-05-29 ENCOUNTER — Telehealth: Payer: Self-pay | Admitting: *Deleted

## 2017-05-29 NOTE — Telephone Encounter (Signed)
Pt's husband dropped off [employer] form on 05/28/17. Form completed and placed on MD desk for review/ signature. Informed Mr. Hollibaugh he can pick up form on 05/30/17. He voiced appreciation.

## 2017-06-07 ENCOUNTER — Other Ambulatory Visit: Payer: Self-pay | Admitting: Nurse Practitioner

## 2017-06-28 ENCOUNTER — Ambulatory Visit: Payer: 59 | Admitting: Nurse Practitioner

## 2019-01-23 IMAGING — DX DG ABD PORTABLE 1V
2 series · 2 of 2 positions shown · non-contrast
Comparison: CT abdomen and pelvis 04/13/2017

CLINICAL DATA: Abdominal pain

EXAM:
PORTABLE ABDOMEN - 1 VIEW

[abdomen kub (1 of 2)]
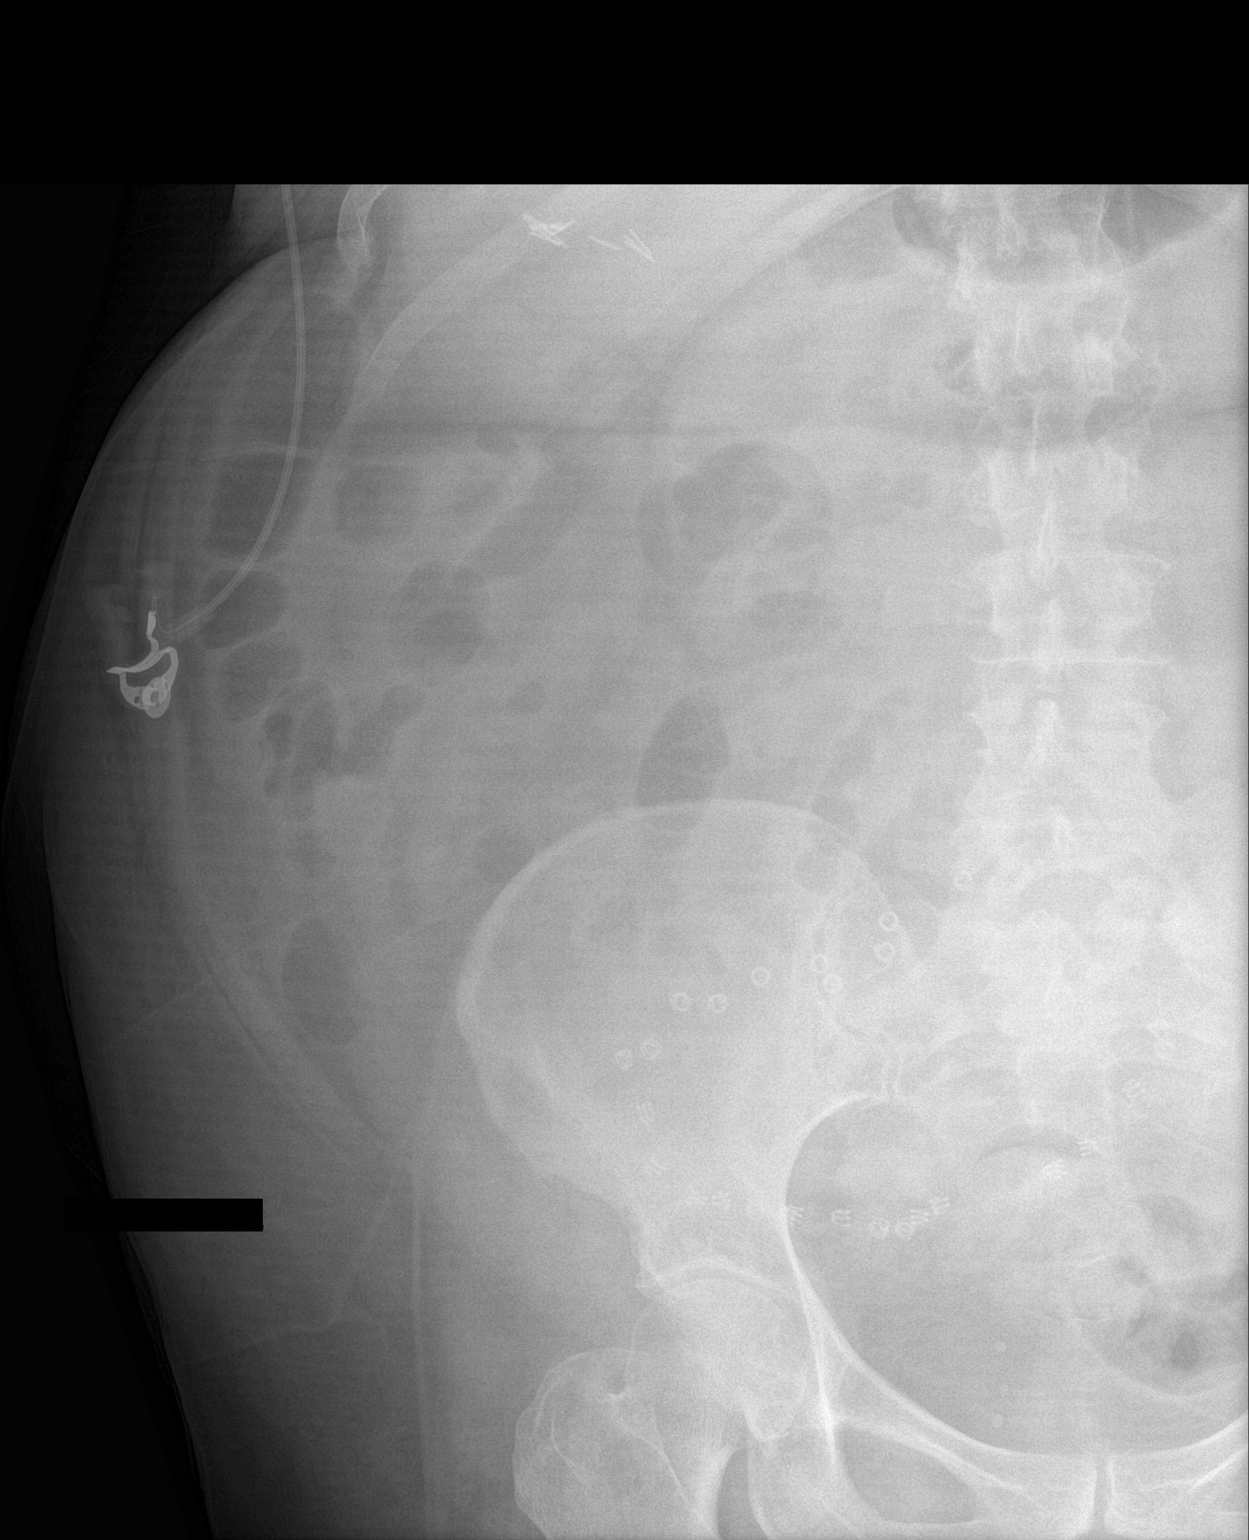

[abdomen kub (2 of 2)]
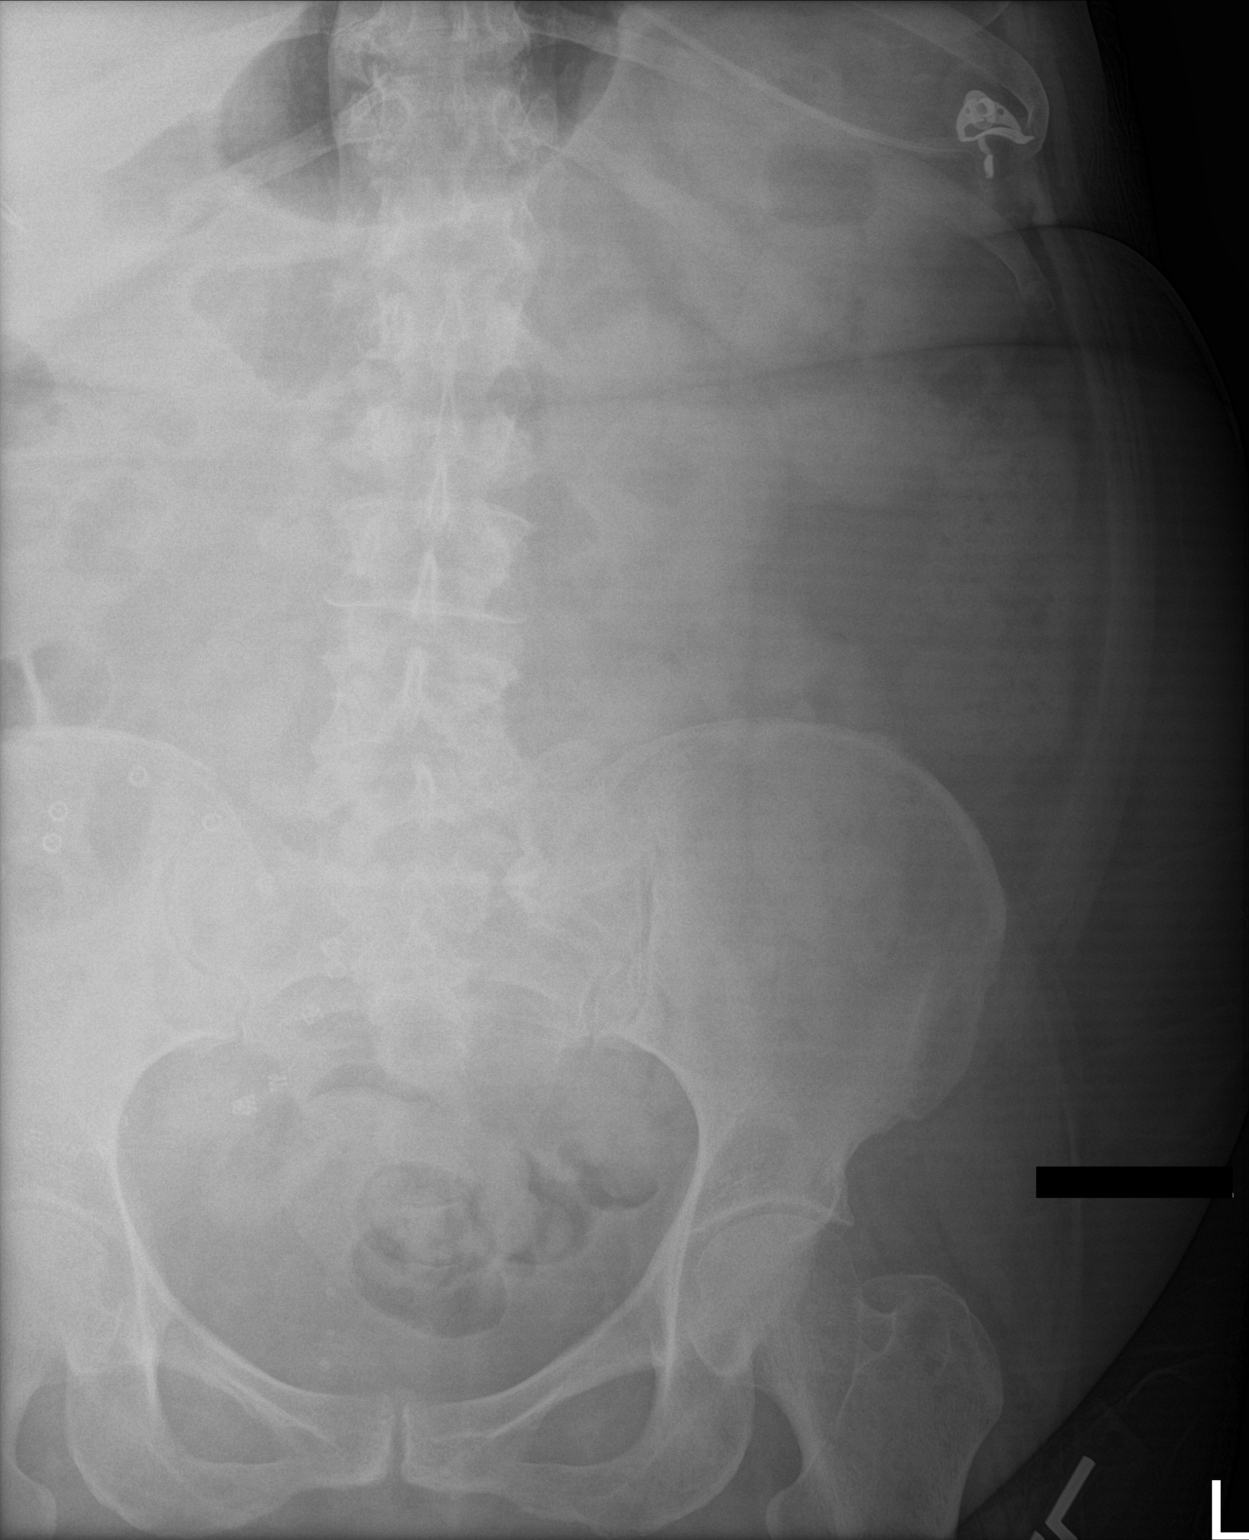

[2 of 2 positions shown; findings below may reference images not displayed]

FINDINGS: Prior ventral hernia repair in pelvis.

Surgical clips RIGHT upper quadrant related to prior
cholecystectomy.

Nonobstructive bowel gas pattern.

No bowel dilatation or bowel wall thickening.

Bones demineralized.

Small pelvic phleboliths without definite urinary tract
calcification.
IMPRESSION: Normal bowel gas pattern.

## 2019-01-26 IMAGING — DX DG ABD PORTABLE 2V
2 series · 2 of 2 positions shown · non-contrast
Comparison: 04/25/2017

CLINICAL DATA: Vomiting

EXAM:
PORTABLE ABDOMEN - 2 VIEW

[abdomen kub (1 of 2)]
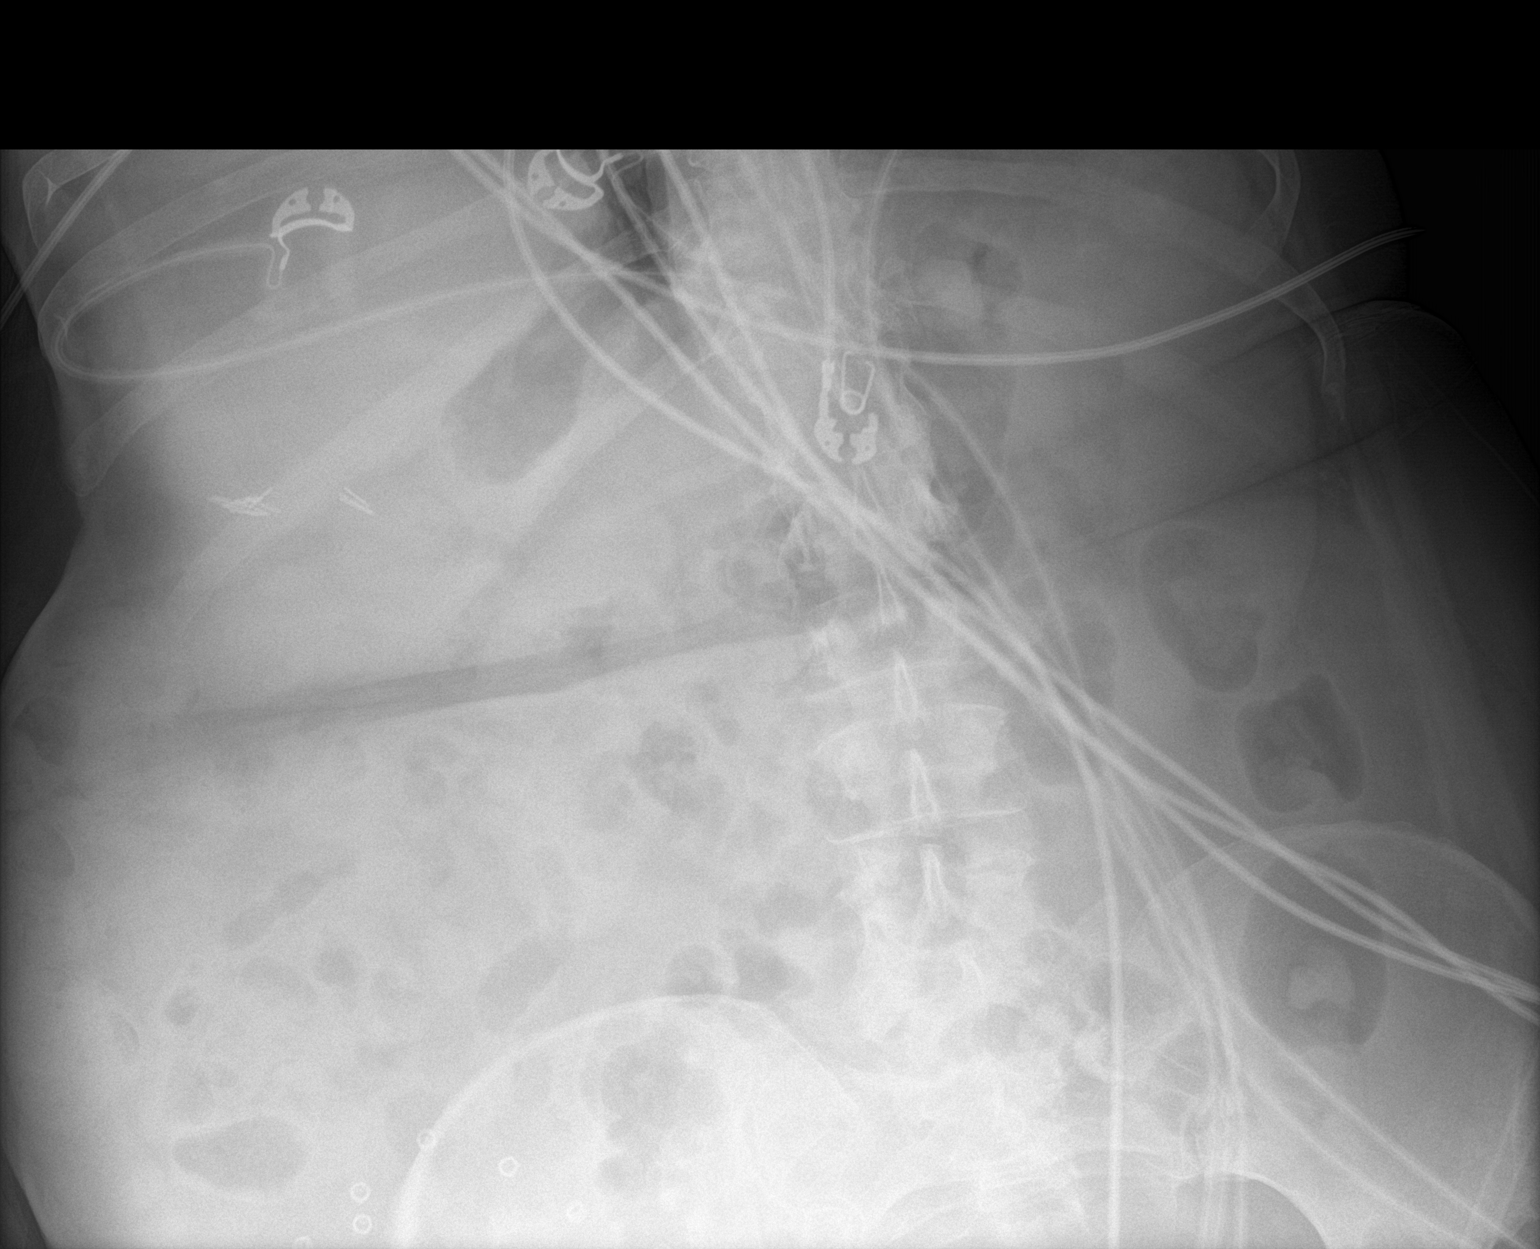

[abdomen kub (2 of 2)]
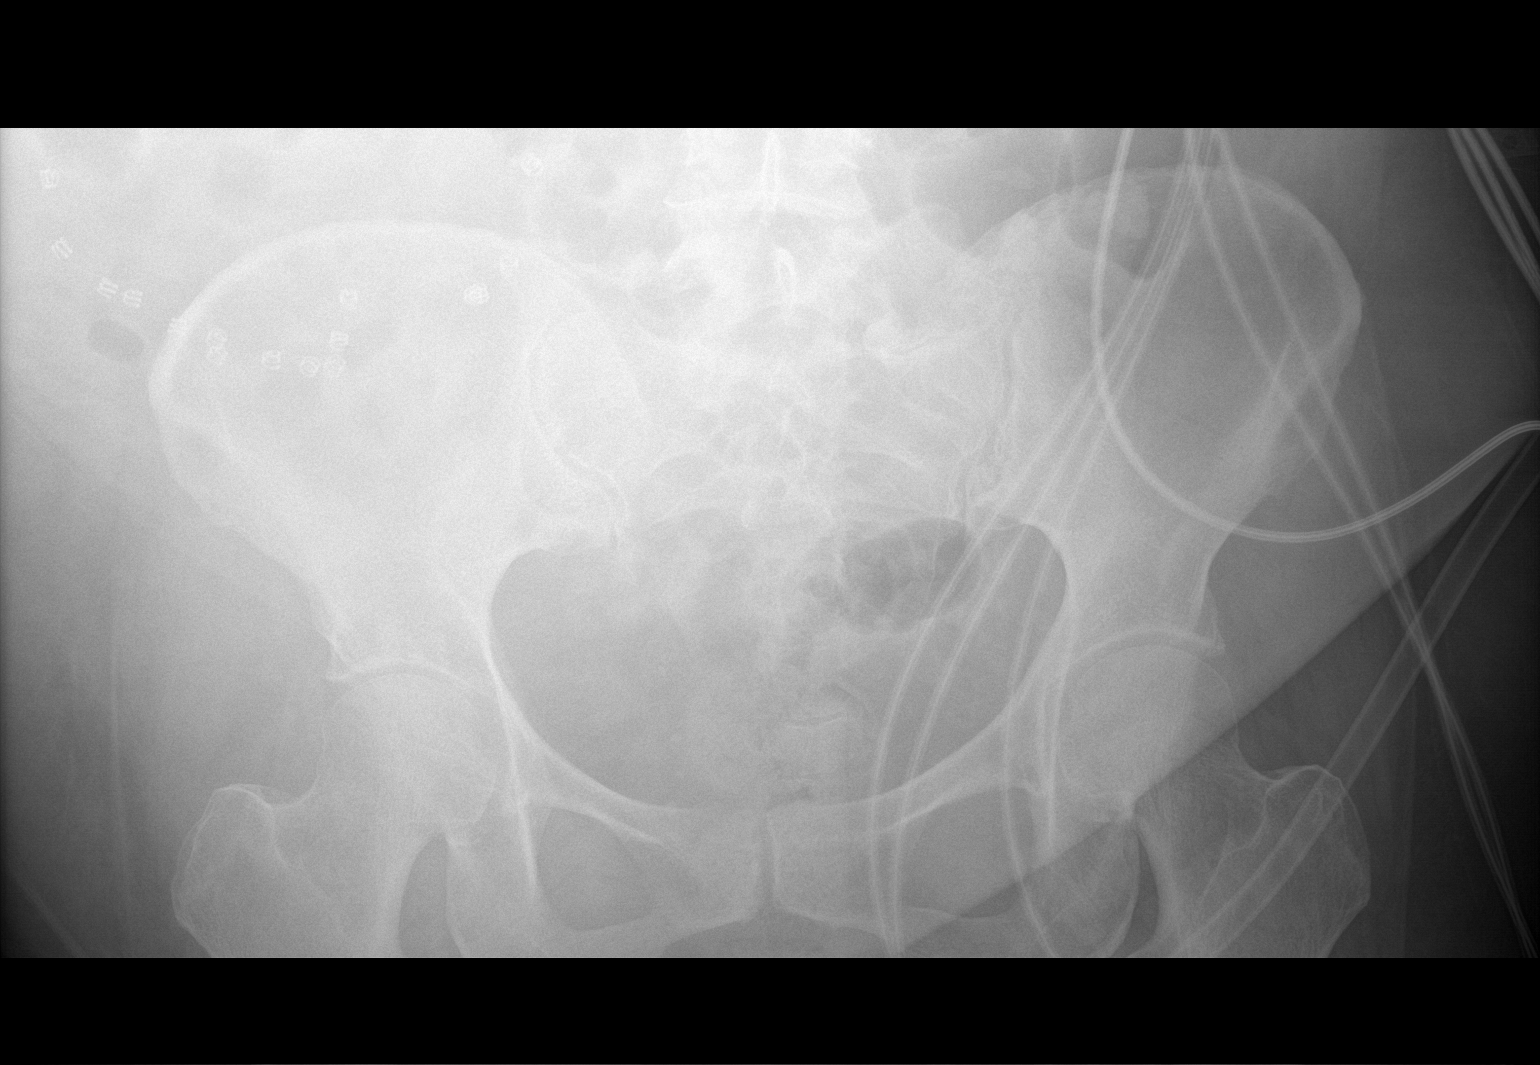

[2 of 2 positions shown; findings below may reference images not displayed]

FINDINGS: The bowel gas pattern is normal. There is no evidence of free air.
No radio-opaque calculi or other significant radiographic
abnormality is seen.
IMPRESSION: Negative.

## 2019-01-26 IMAGING — CT CT ABD-PELV W/ CM
2 of 4 series · 16 of 46 positions shown, 18 images · IV contrast (ISOVUE)
Comparison: Abdominal CT 04/13/2017

CLINICAL DATA: Progressive abdominal pain with history of
pancreatic cancer.

EXAM:
CT ABDOMEN AND PELVIS WITH CONTRAST
TECHNIQUE: Multidetector CT imaging of the abdomen and pelvis was performed
using the standard protocol following bolus administration of
intravenous contrast.
CONTRAST:  80 cc Isovue 300 intravenously.

[Series 2: axial st · axial · 0.97mm/px · z∈[-524,-60]mm · 13 of 107 slices shown, 15 images]
[im 7/107  soft-tissue]
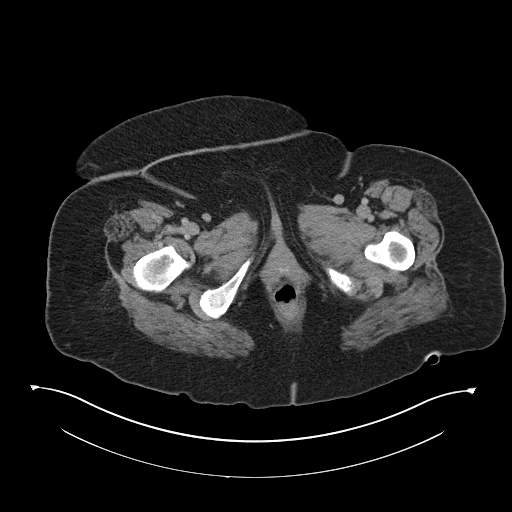
[im 7/107  bone]
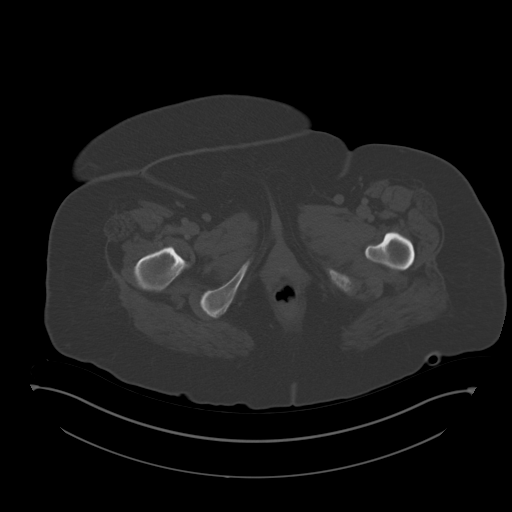
[im 13/107  soft-tissue]
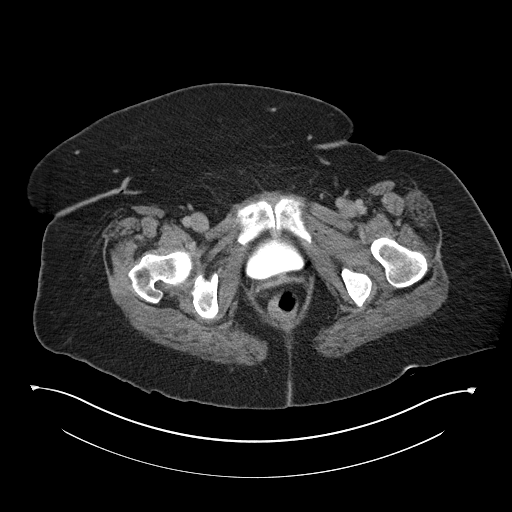
[im 25/107  soft-tissue]
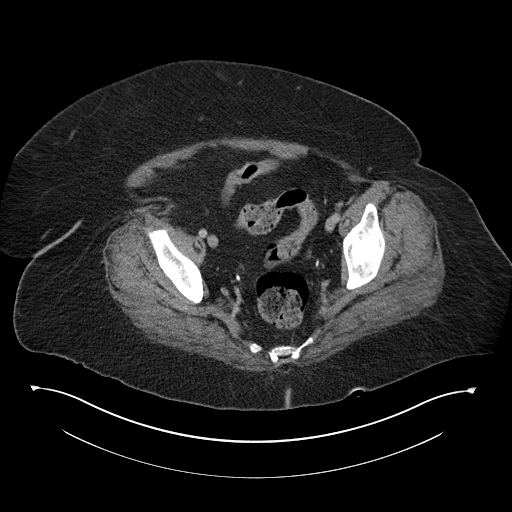
[im 32/107  soft-tissue]
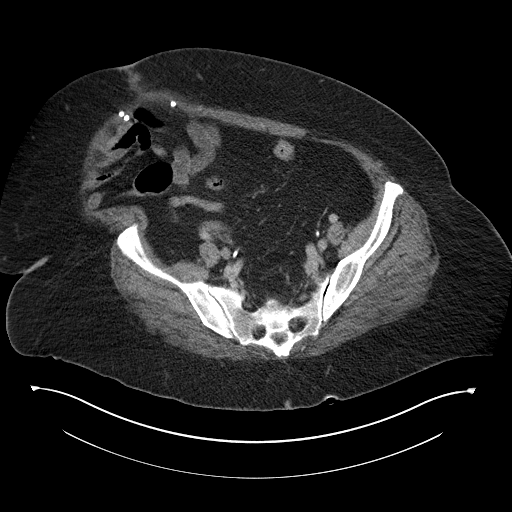
[im 38/107  soft-tissue]
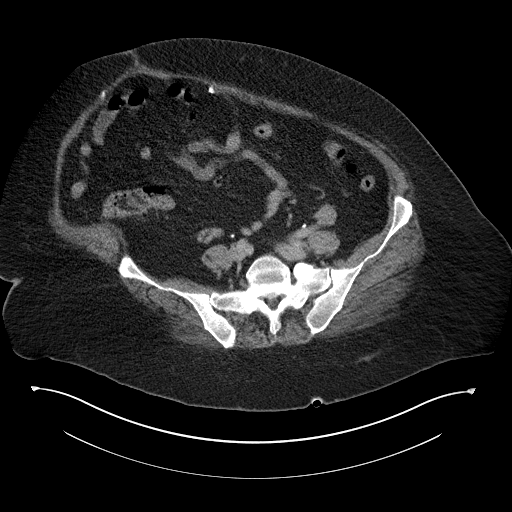
[im 44/107  soft-tissue]
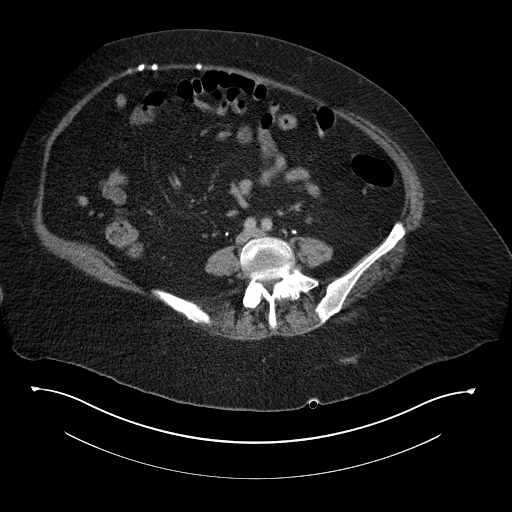
[im 57/107  soft-tissue]
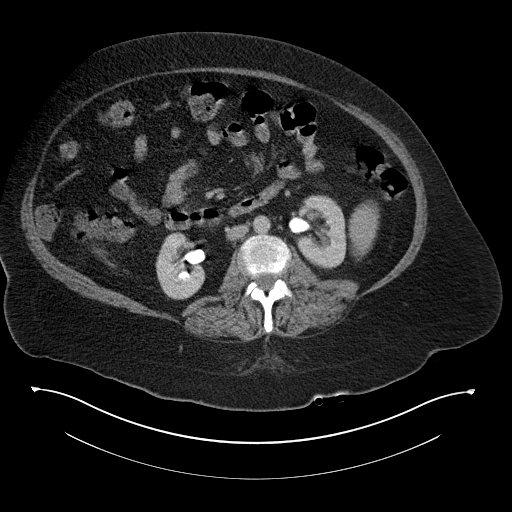
[im 63/107  soft-tissue]
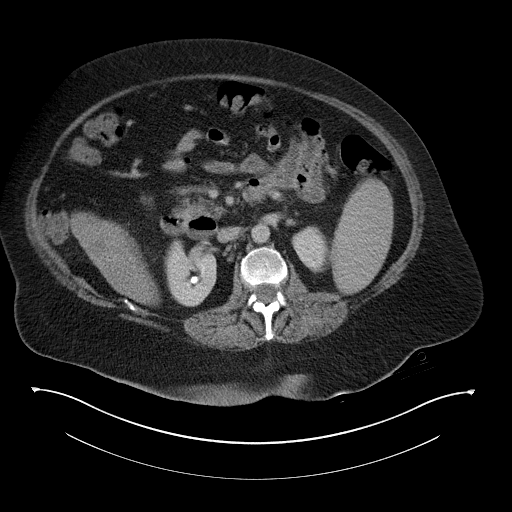
[im 69/107  soft-tissue]
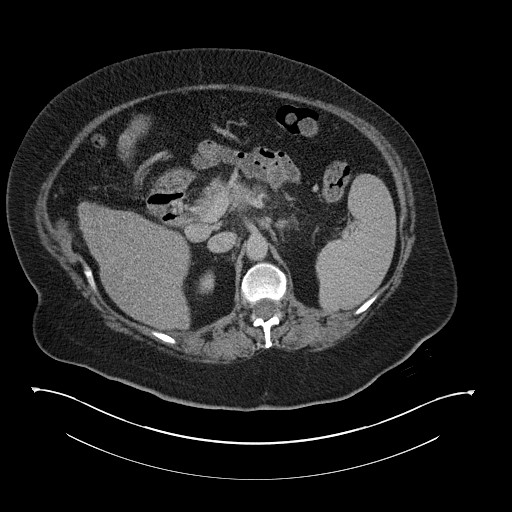
[im 69/107  bone]
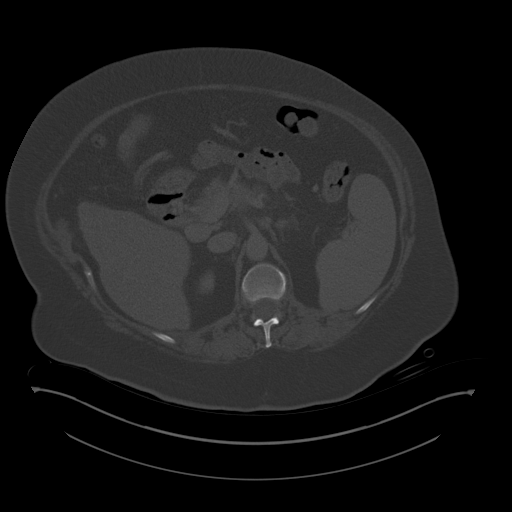
[im 75/107  soft-tissue]
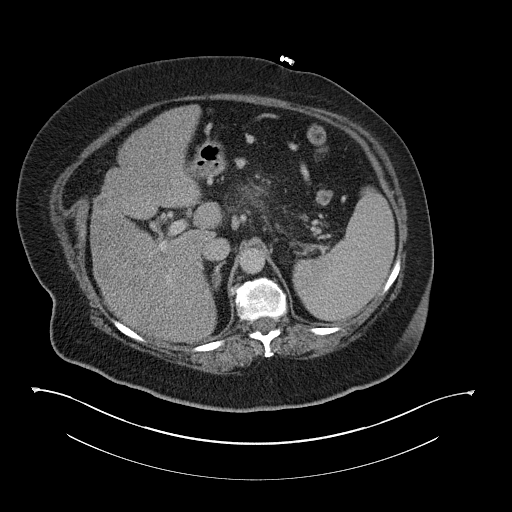
[im 82/107  soft-tissue]
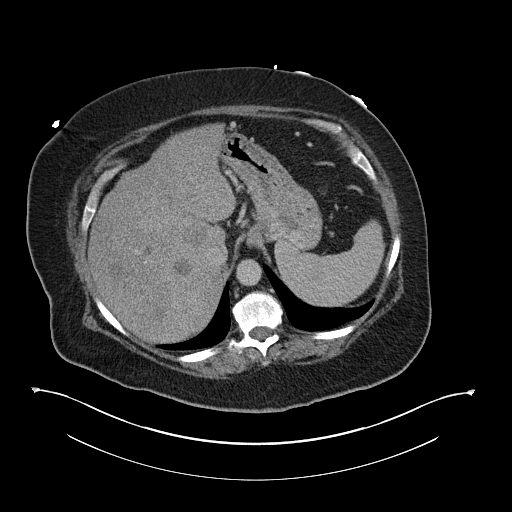
[im 94/107  soft-tissue]
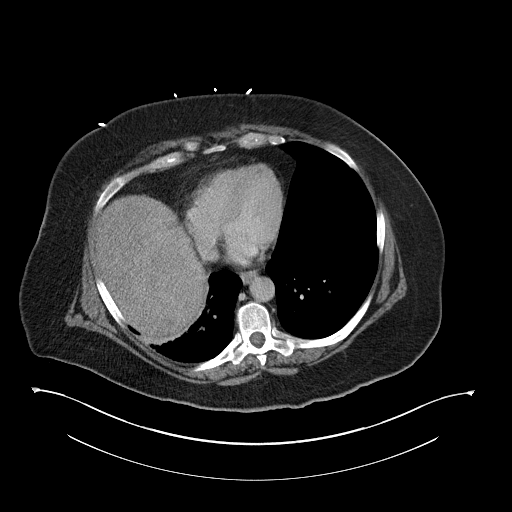
[im 100/107  soft-tissue]
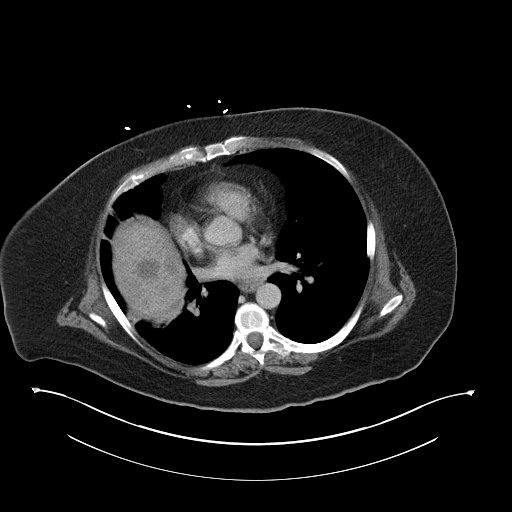

[Series 4: coronal st · coronal · 1.01mm/px · 3 of 122 slices shown]
[im 41/122  soft-tissue]
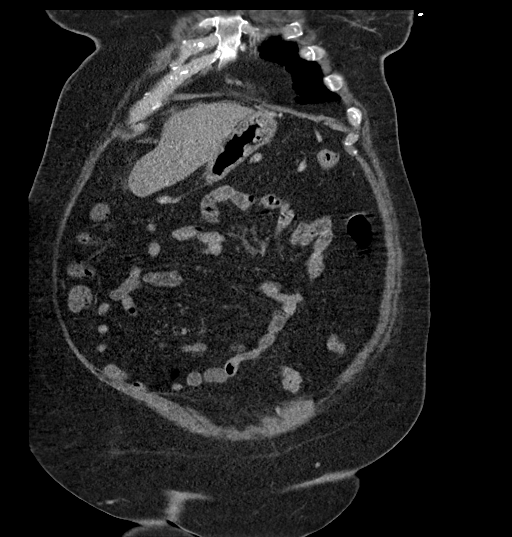
[im 54/122  soft-tissue]
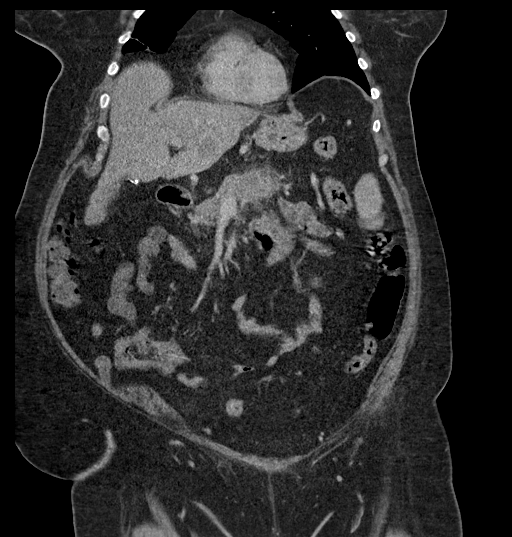
[im 68/122  soft-tissue]
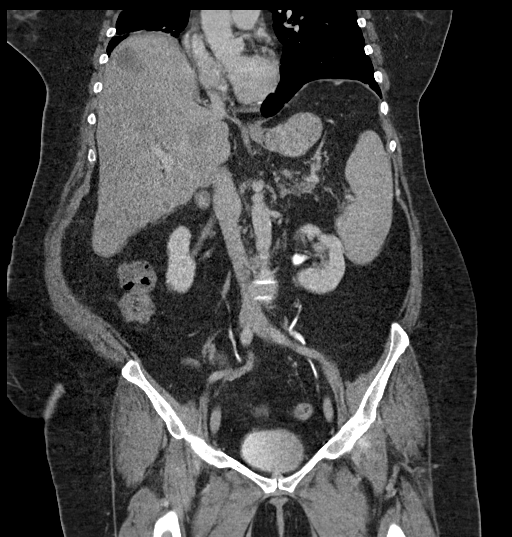

[16 of 46 positions shown; findings below may reference images not displayed]

FINDINGS: Lower chest: Atelectatic changes in the right lung base. Partially
visualized 6 mm right upper lobe pulmonary nodule.

Hepatobiliary: No significant change in numerous bilateral hepatic
metastatic lesions.

Pancreas: Decreased in size hypoattenuated body of the pancreas
malignancy, now measuring approximately 4.0 by 2.5 cm. Persistent
dilation of the downstream main pancreatic duct and atrophy of the
tail of the pancreas. Persistent peripancreatic fat stranding. Small
peripancreatic lymph nodes appear stable.

Spleen: Normal in size without focal abnormality.

Adrenals/Urinary Tract: Adrenal glands are unremarkable. Kidneys are
normal, without renal calculi, focal lesion, or hydronephrosis.
Bladder is unremarkable.

Stomach/Bowel: Stomach is within normal limits. Appendix appears
normal. No evidence of bowel wall thickening, distention, or
inflammatory changes.

Vascular/Lymphatic: Mild upper abdominal retroperitoneal
lymphadenopathy. No significant vascular findings.

Reproductive: Status post hysterectomy. No adnexal masses.

Other: No abdominal wall hernia or abnormality. No abdominopelvic
ascites. Prior ventral hernia repair.

Musculoskeletal: No acute or significant osseous findings.
IMPRESSION: Suboptimal timing of contrast administration.

Interval mild decrease in size of pancreatic body malignancy.

Mild upper abdominal retroperitoneal lymphadenopathy.

No significant change in widespread hepatic metastatic disease.

## 2019-01-29 IMAGING — DX DG ABDOMEN 1V
1 series · 1 of 1 positions shown · non-contrast
Comparison: Abdominal radiographs of May 27, 2017

CLINICAL DATA: Nausea. History of metastatic pancreatic malignancy.

EXAM:
ABDOMEN - 1 VIEW

[abdomen kub]
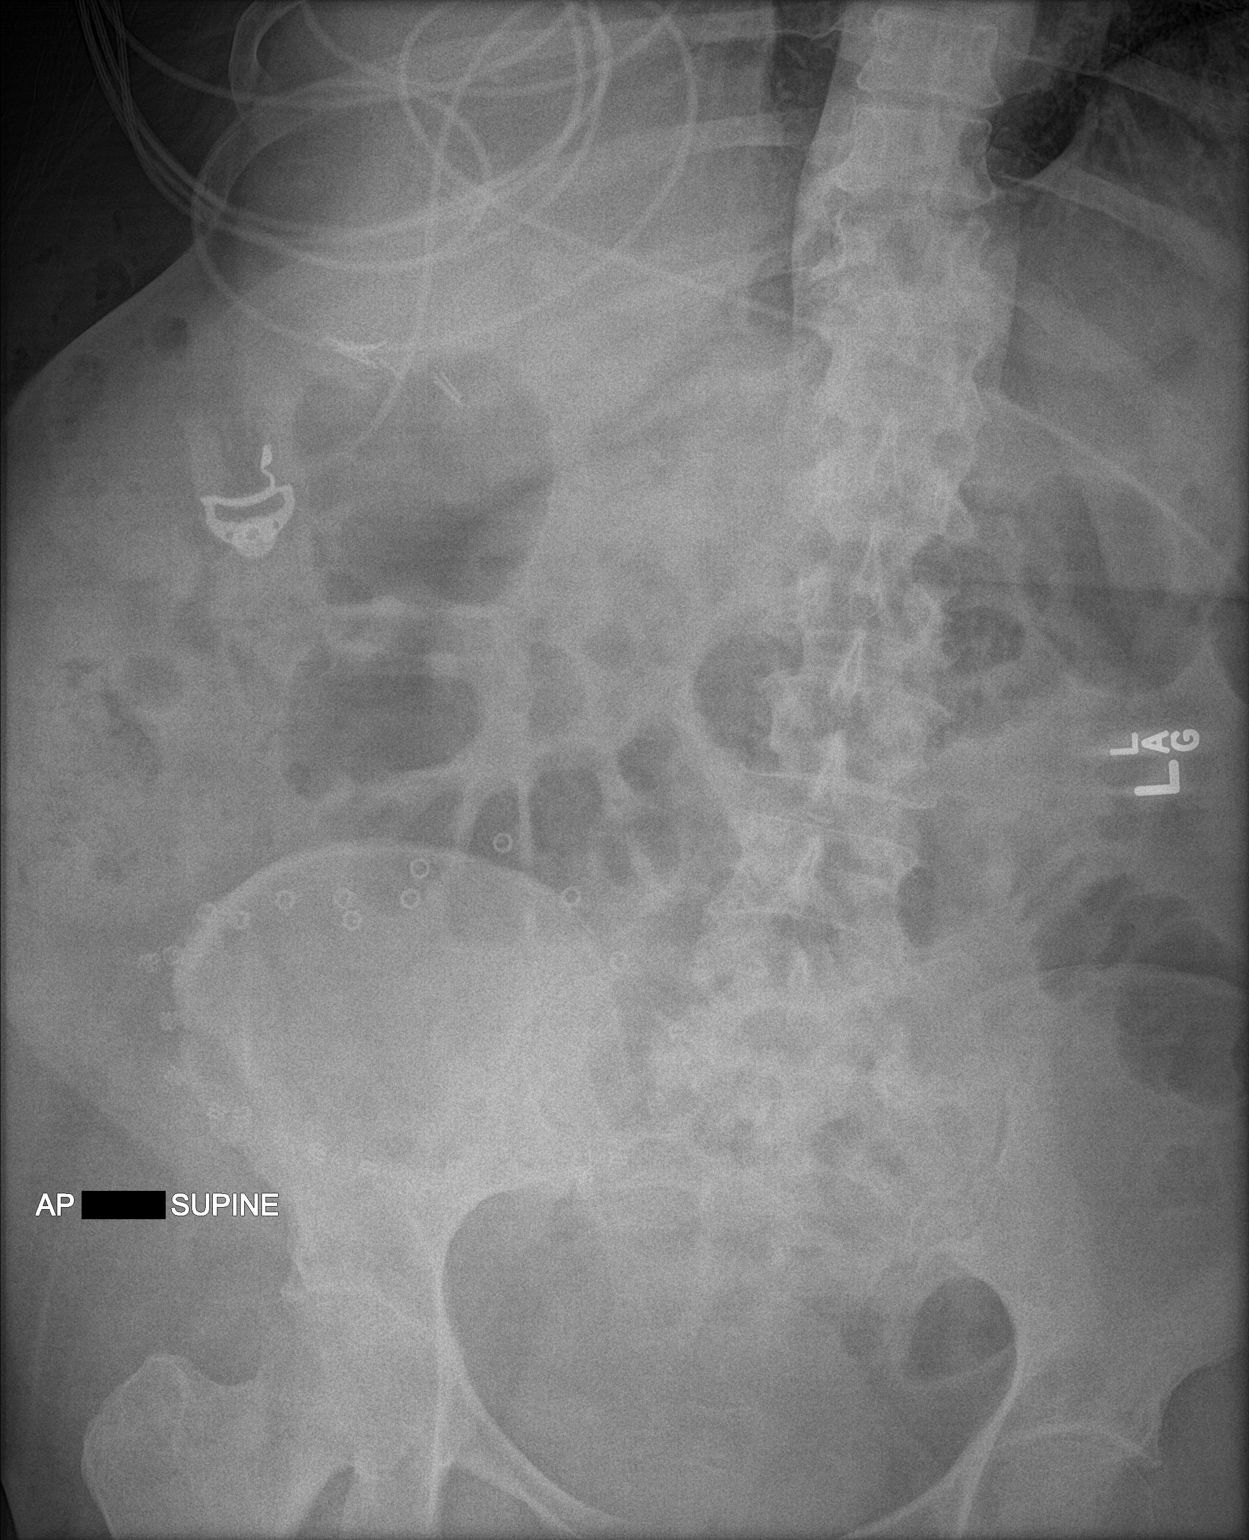

[1 of 1 positions shown; findings below may reference images not displayed]

FINDINGS: The colonic stool and gas pattern is similar to that seen yesterday.
There is no evidence of obstruction or significant ileus. There is
no significant rectal gas.
IMPRESSION: No evidence of bowel obstruction.  Moderate colonic stool burden.
